# Patient Record
Sex: Female | Born: 1950 | Race: White | Hispanic: No | Marital: Married | Smoking: Never smoker
Health system: Southern US, Community
[De-identification: ages and names within clinical notes are randomized; demographics above are authoritative.]

## PROBLEM LIST (undated history)

## (undated) DIAGNOSIS — K5790 Diverticulosis of intestine, part unspecified, without perforation or abscess without bleeding: Secondary | ICD-10-CM

## (undated) DIAGNOSIS — M1990 Unspecified osteoarthritis, unspecified site: Secondary | ICD-10-CM

## (undated) DIAGNOSIS — G4730 Sleep apnea, unspecified: Secondary | ICD-10-CM

## (undated) DIAGNOSIS — D649 Anemia, unspecified: Secondary | ICD-10-CM

## (undated) DIAGNOSIS — K589 Irritable bowel syndrome without diarrhea: Secondary | ICD-10-CM

## (undated) DIAGNOSIS — J449 Chronic obstructive pulmonary disease, unspecified: Secondary | ICD-10-CM

## (undated) DIAGNOSIS — K649 Unspecified hemorrhoids: Secondary | ICD-10-CM

## (undated) DIAGNOSIS — Z9889 Other specified postprocedural states: Secondary | ICD-10-CM

## (undated) DIAGNOSIS — M549 Dorsalgia, unspecified: Secondary | ICD-10-CM

## (undated) DIAGNOSIS — C8290 Follicular lymphoma, unspecified, unspecified site: Secondary | ICD-10-CM

## (undated) DIAGNOSIS — M797 Fibromyalgia: Secondary | ICD-10-CM

## (undated) DIAGNOSIS — F32A Depression, unspecified: Secondary | ICD-10-CM

## (undated) DIAGNOSIS — K222 Esophageal obstruction: Secondary | ICD-10-CM

## (undated) DIAGNOSIS — K219 Gastro-esophageal reflux disease without esophagitis: Secondary | ICD-10-CM

## (undated) DIAGNOSIS — T7840XA Allergy, unspecified, initial encounter: Secondary | ICD-10-CM

## (undated) DIAGNOSIS — Z5189 Encounter for other specified aftercare: Secondary | ICD-10-CM

## (undated) DIAGNOSIS — R112 Nausea with vomiting, unspecified: Secondary | ICD-10-CM

## (undated) DIAGNOSIS — G8929 Other chronic pain: Secondary | ICD-10-CM

## (undated) DIAGNOSIS — E7800 Pure hypercholesterolemia, unspecified: Secondary | ICD-10-CM

## (undated) DIAGNOSIS — J40 Bronchitis, not specified as acute or chronic: Secondary | ICD-10-CM

## (undated) DIAGNOSIS — F329 Major depressive disorder, single episode, unspecified: Secondary | ICD-10-CM

## (undated) DIAGNOSIS — Z87442 Personal history of urinary calculi: Secondary | ICD-10-CM

## (undated) DIAGNOSIS — B029 Zoster without complications: Secondary | ICD-10-CM

## (undated) DIAGNOSIS — N200 Calculus of kidney: Secondary | ICD-10-CM

## (undated) DIAGNOSIS — K449 Diaphragmatic hernia without obstruction or gangrene: Secondary | ICD-10-CM

## (undated) HISTORY — DX: Diaphragmatic hernia without obstruction or gangrene: K44.9

## (undated) HISTORY — DX: Encounter for other specified aftercare: Z51.89

## (undated) HISTORY — PX: OVARIAN CYST REMOVAL: SHX89

## (undated) HISTORY — DX: Irritable bowel syndrome, unspecified: K58.9

## (undated) HISTORY — DX: Depression, unspecified: F32.A

## (undated) HISTORY — DX: Follicular lymphoma, unspecified, unspecified site: C82.90

## (undated) HISTORY — DX: Major depressive disorder, single episode, unspecified: F32.9

## (undated) HISTORY — DX: Other chronic pain: G89.29

## (undated) HISTORY — DX: Pure hypercholesterolemia, unspecified: E78.00

## (undated) HISTORY — DX: Zoster without complications: B02.9

## (undated) HISTORY — PX: TONSILLECTOMY: SUR1361

## (undated) HISTORY — PX: LEFT OOPHORECTOMY: SHX1961

## (undated) HISTORY — PX: RECTOCELE REPAIR: SHX761

## (undated) HISTORY — DX: Calculus of kidney: N20.0

## (undated) HISTORY — DX: Bronchitis, not specified as acute or chronic: J40

## (undated) HISTORY — DX: Anemia, unspecified: D64.9

## (undated) HISTORY — PX: VAGINAL HYSTERECTOMY: SUR661

## (undated) HISTORY — DX: Other chronic pain: M54.9

## (undated) HISTORY — PX: EYE SURGERY: SHX253

## (undated) HISTORY — DX: Gastro-esophageal reflux disease without esophagitis: K21.9

## (undated) HISTORY — DX: Allergy, unspecified, initial encounter: T78.40XA

## (undated) HISTORY — PX: APPENDECTOMY: SHX54

## (undated) HISTORY — PX: FRACTURE SURGERY: SHX138

## (undated) HISTORY — DX: Fibromyalgia: M79.7

## (undated) HISTORY — PX: BREAST BIOPSY: SHX20

## (undated) HISTORY — PX: JOINT REPLACEMENT: SHX530

## (undated) HISTORY — PX: OTHER SURGICAL HISTORY: SHX169

## (undated) HISTORY — DX: Chronic obstructive pulmonary disease, unspecified: J44.9

## (undated) HISTORY — DX: Esophageal obstruction: K22.2

## (undated) HISTORY — PX: LEG SURGERY: SHX1003

---

## 1968-02-06 HISTORY — PX: TONSILECTOMY/ADENOIDECTOMY WITH MYRINGOTOMY: SHX6125

## 1974-02-05 HISTORY — PX: TUBAL LIGATION: SHX77

## 1991-02-06 HISTORY — PX: BLADDER SURGERY: SHX569

## 1993-02-05 HISTORY — PX: CHOLECYSTECTOMY: SHX55

## 2003-12-23 ENCOUNTER — Ambulatory Visit: Payer: Self-pay | Admitting: Pain Medicine

## 2004-01-25 ENCOUNTER — Ambulatory Visit: Payer: Self-pay | Admitting: Pain Medicine

## 2004-04-13 ENCOUNTER — Ambulatory Visit: Payer: Self-pay | Admitting: Pain Medicine

## 2004-05-25 ENCOUNTER — Ambulatory Visit: Payer: Self-pay | Admitting: General Surgery

## 2004-07-04 ENCOUNTER — Ambulatory Visit: Payer: Self-pay | Admitting: Pain Medicine

## 2004-10-24 ENCOUNTER — Ambulatory Visit: Payer: Self-pay | Admitting: Pain Medicine

## 2004-11-16 ENCOUNTER — Ambulatory Visit: Payer: Self-pay | Admitting: Unknown Physician Specialty

## 2004-12-18 ENCOUNTER — Ambulatory Visit: Payer: Self-pay | Admitting: Unknown Physician Specialty

## 2004-12-26 ENCOUNTER — Ambulatory Visit: Payer: Self-pay | Admitting: Pain Medicine

## 2005-03-01 ENCOUNTER — Ambulatory Visit: Payer: Self-pay | Admitting: Pain Medicine

## 2005-04-24 ENCOUNTER — Ambulatory Visit: Payer: Self-pay | Admitting: Pain Medicine

## 2005-06-05 ENCOUNTER — Ambulatory Visit: Payer: Self-pay | Admitting: General Surgery

## 2005-06-19 ENCOUNTER — Ambulatory Visit: Payer: Self-pay | Admitting: Pain Medicine

## 2005-07-04 ENCOUNTER — Ambulatory Visit: Payer: Self-pay | Admitting: Pain Medicine

## 2005-08-16 ENCOUNTER — Ambulatory Visit: Payer: Self-pay | Admitting: Pain Medicine

## 2005-11-15 ENCOUNTER — Ambulatory Visit: Payer: Self-pay | Admitting: Pain Medicine

## 2005-11-26 ENCOUNTER — Ambulatory Visit: Payer: Self-pay | Admitting: Pain Medicine

## 2005-12-11 ENCOUNTER — Ambulatory Visit: Payer: Self-pay | Admitting: Pain Medicine

## 2006-02-07 ENCOUNTER — Ambulatory Visit: Payer: Self-pay | Admitting: Pain Medicine

## 2006-04-11 ENCOUNTER — Ambulatory Visit: Payer: Self-pay | Admitting: Pain Medicine

## 2006-06-06 ENCOUNTER — Ambulatory Visit: Payer: Self-pay | Admitting: Pain Medicine

## 2006-06-11 ENCOUNTER — Ambulatory Visit: Payer: Self-pay | Admitting: General Surgery

## 2006-08-13 ENCOUNTER — Ambulatory Visit: Payer: Self-pay | Admitting: Pain Medicine

## 2006-08-16 ENCOUNTER — Ambulatory Visit: Payer: Self-pay | Admitting: Unknown Physician Specialty

## 2006-10-08 ENCOUNTER — Ambulatory Visit: Payer: Self-pay | Admitting: Pain Medicine

## 2006-12-10 ENCOUNTER — Ambulatory Visit: Payer: Self-pay | Admitting: Pain Medicine

## 2007-01-16 ENCOUNTER — Ambulatory Visit: Payer: Self-pay | Admitting: Pain Medicine

## 2007-02-03 ENCOUNTER — Ambulatory Visit: Payer: Self-pay | Admitting: Unknown Physician Specialty

## 2007-02-11 ENCOUNTER — Ambulatory Visit: Payer: Self-pay | Admitting: Pain Medicine

## 2007-03-13 ENCOUNTER — Ambulatory Visit: Payer: Self-pay | Admitting: Pain Medicine

## 2007-05-13 ENCOUNTER — Ambulatory Visit: Payer: Self-pay | Admitting: Pain Medicine

## 2007-06-18 ENCOUNTER — Ambulatory Visit: Payer: Self-pay | Admitting: General Surgery

## 2007-06-19 ENCOUNTER — Ambulatory Visit: Payer: Self-pay | Admitting: General Surgery

## 2007-07-08 ENCOUNTER — Ambulatory Visit: Payer: Self-pay | Admitting: Pain Medicine

## 2007-10-28 ENCOUNTER — Ambulatory Visit: Payer: Self-pay | Admitting: Pain Medicine

## 2007-12-30 ENCOUNTER — Ambulatory Visit: Payer: Self-pay | Admitting: Pain Medicine

## 2008-02-26 ENCOUNTER — Ambulatory Visit: Payer: Self-pay | Admitting: Pain Medicine

## 2008-03-01 ENCOUNTER — Ambulatory Visit: Payer: Self-pay | Admitting: Pain Medicine

## 2008-03-08 ENCOUNTER — Ambulatory Visit: Payer: Self-pay | Admitting: Internal Medicine

## 2008-03-22 ENCOUNTER — Ambulatory Visit: Payer: Self-pay | Admitting: Pain Medicine

## 2008-03-25 ENCOUNTER — Ambulatory Visit: Payer: Self-pay | Admitting: Pain Medicine

## 2008-03-29 ENCOUNTER — Emergency Department: Payer: Self-pay | Admitting: Emergency Medicine

## 2008-03-30 ENCOUNTER — Inpatient Hospital Stay: Payer: Self-pay | Admitting: Urology

## 2008-04-05 ENCOUNTER — Ambulatory Visit: Payer: Self-pay | Admitting: Internal Medicine

## 2008-04-08 ENCOUNTER — Ambulatory Visit: Payer: Self-pay | Admitting: Urology

## 2008-04-15 ENCOUNTER — Ambulatory Visit: Payer: Self-pay | Admitting: Urology

## 2008-04-21 ENCOUNTER — Ambulatory Visit: Payer: Self-pay | Admitting: Urology

## 2008-04-27 ENCOUNTER — Ambulatory Visit: Payer: Self-pay | Admitting: Urology

## 2008-05-18 ENCOUNTER — Ambulatory Visit: Payer: Self-pay | Admitting: Pain Medicine

## 2008-05-24 ENCOUNTER — Ambulatory Visit: Payer: Self-pay | Admitting: Pain Medicine

## 2008-06-17 ENCOUNTER — Ambulatory Visit: Payer: Self-pay | Admitting: Pain Medicine

## 2008-06-21 ENCOUNTER — Ambulatory Visit: Payer: Self-pay | Admitting: General Surgery

## 2008-07-20 ENCOUNTER — Ambulatory Visit: Payer: Self-pay | Admitting: Pain Medicine

## 2008-07-28 ENCOUNTER — Ambulatory Visit: Payer: Self-pay | Admitting: Pain Medicine

## 2008-08-12 ENCOUNTER — Ambulatory Visit: Payer: Self-pay | Admitting: Pain Medicine

## 2008-09-09 ENCOUNTER — Ambulatory Visit: Payer: Self-pay | Admitting: Pain Medicine

## 2008-09-29 ENCOUNTER — Ambulatory Visit: Payer: Self-pay | Admitting: Unknown Physician Specialty

## 2008-10-07 ENCOUNTER — Ambulatory Visit: Payer: Self-pay | Admitting: Pain Medicine

## 2008-10-21 ENCOUNTER — Ambulatory Visit: Payer: Self-pay | Admitting: Urology

## 2008-11-11 ENCOUNTER — Ambulatory Visit: Payer: Self-pay | Admitting: Pain Medicine

## 2008-12-09 ENCOUNTER — Ambulatory Visit: Payer: Self-pay | Admitting: Pain Medicine

## 2009-01-06 ENCOUNTER — Ambulatory Visit: Payer: Self-pay | Admitting: Pain Medicine

## 2009-02-08 ENCOUNTER — Ambulatory Visit: Payer: Self-pay | Admitting: Pain Medicine

## 2009-03-10 ENCOUNTER — Ambulatory Visit: Payer: Self-pay | Admitting: Pain Medicine

## 2009-04-12 ENCOUNTER — Ambulatory Visit: Payer: Self-pay | Admitting: Pain Medicine

## 2009-05-10 ENCOUNTER — Ambulatory Visit: Payer: Self-pay | Admitting: Pain Medicine

## 2009-05-31 ENCOUNTER — Ambulatory Visit: Payer: Self-pay

## 2009-06-02 ENCOUNTER — Ambulatory Visit: Payer: Self-pay | Admitting: Unknown Physician Specialty

## 2009-06-09 ENCOUNTER — Ambulatory Visit: Payer: Self-pay | Admitting: Pain Medicine

## 2009-06-24 ENCOUNTER — Ambulatory Visit: Payer: Self-pay | Admitting: General Surgery

## 2009-06-27 ENCOUNTER — Ambulatory Visit: Payer: Self-pay | Admitting: Unknown Physician Specialty

## 2009-06-30 ENCOUNTER — Ambulatory Visit: Payer: Self-pay | Admitting: Pain Medicine

## 2009-06-30 ENCOUNTER — Ambulatory Visit: Payer: Self-pay | Admitting: General Surgery

## 2009-08-04 ENCOUNTER — Ambulatory Visit: Payer: Self-pay | Admitting: Pain Medicine

## 2009-09-06 ENCOUNTER — Ambulatory Visit: Payer: Self-pay | Admitting: Pain Medicine

## 2009-10-06 ENCOUNTER — Ambulatory Visit: Payer: Self-pay | Admitting: Pain Medicine

## 2009-10-24 ENCOUNTER — Ambulatory Visit: Payer: Self-pay | Admitting: Urology

## 2009-11-03 ENCOUNTER — Ambulatory Visit: Payer: Self-pay | Admitting: Pain Medicine

## 2009-11-29 ENCOUNTER — Ambulatory Visit: Payer: Self-pay | Admitting: Pain Medicine

## 2009-12-05 ENCOUNTER — Ambulatory Visit: Payer: Self-pay | Admitting: Pain Medicine

## 2010-01-03 ENCOUNTER — Ambulatory Visit: Payer: Self-pay | Admitting: Pain Medicine

## 2010-01-31 ENCOUNTER — Ambulatory Visit: Payer: Self-pay | Admitting: Pain Medicine

## 2010-02-05 HISTORY — PX: ORIF ANKLE FRACTURE: SUR919

## 2010-02-13 ENCOUNTER — Ambulatory Visit: Payer: Self-pay | Admitting: Pain Medicine

## 2010-02-25 IMAGING — CR DG ABDOMEN 1V
1 series · 1 of 1 positions shown · non-contrast
Comparison: none

REASON FOR EXAM: nephrolithiasis
COMMENTS:

PROCEDURE:     DXR - DXR KIDNEY URETER BLADDER  - October 21, 2008 [DATE]
RESULT:     Comparison is made to the exam of 04/27/2008.
No radiopaque renal calculi are evident. Cholecystectomy clips are present.
The bowel gas pattern is unremarkable.

[view not recorded]
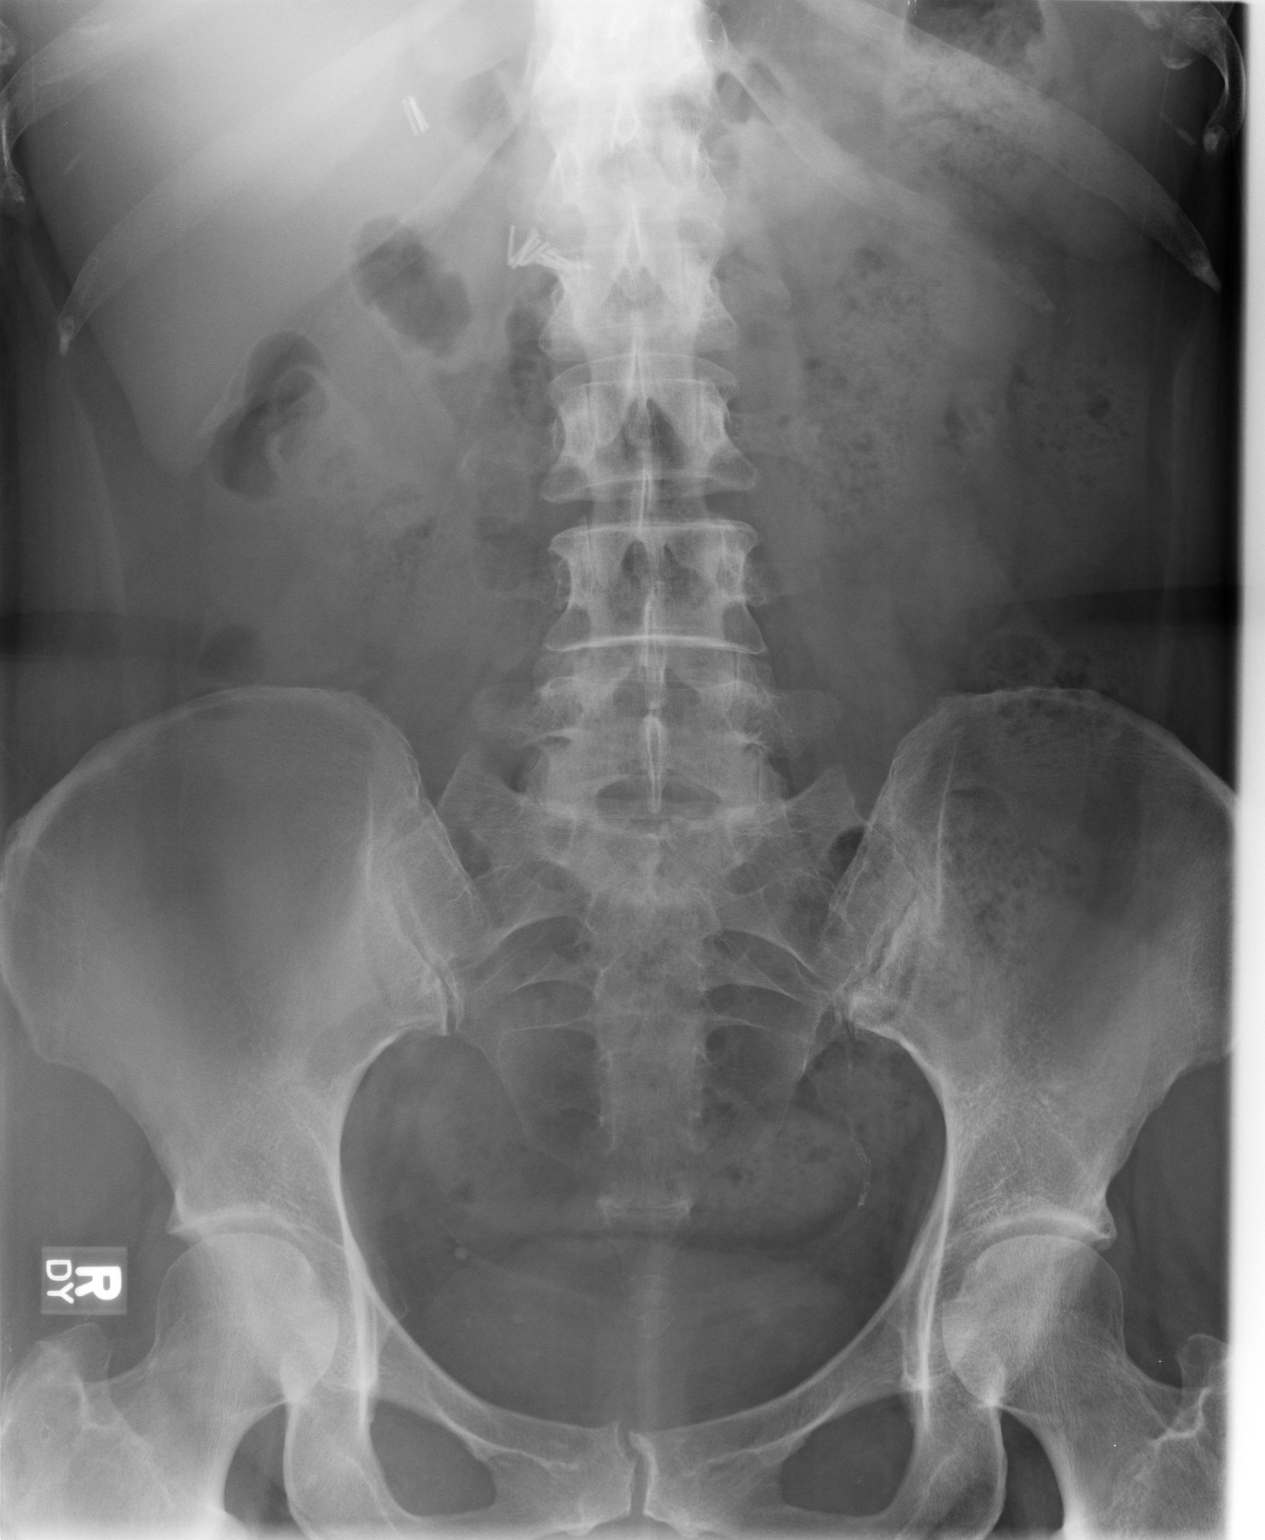

[1 of 1 positions shown; findings below may reference images not displayed]

IMPRESSION: No definite urinary tract calculi are evident. CT is
available for further assessment if desired.

## 2010-02-28 ENCOUNTER — Ambulatory Visit: Payer: Self-pay | Admitting: Pain Medicine

## 2010-03-30 ENCOUNTER — Ambulatory Visit: Payer: Self-pay | Admitting: Pain Medicine

## 2010-05-02 ENCOUNTER — Ambulatory Visit: Payer: Self-pay | Admitting: Pain Medicine

## 2010-05-10 ENCOUNTER — Ambulatory Visit: Payer: Self-pay | Admitting: Pain Medicine

## 2010-06-01 ENCOUNTER — Ambulatory Visit: Payer: Self-pay | Admitting: Pain Medicine

## 2010-06-29 ENCOUNTER — Ambulatory Visit: Payer: Self-pay | Admitting: Pain Medicine

## 2010-08-02 ENCOUNTER — Ambulatory Visit: Payer: Self-pay | Admitting: Pain Medicine

## 2010-09-05 ENCOUNTER — Ambulatory Visit: Payer: Self-pay | Admitting: Pain Medicine

## 2010-09-06 ENCOUNTER — Ambulatory Visit: Payer: Self-pay | Admitting: Internal Medicine

## 2010-09-07 ENCOUNTER — Ambulatory Visit: Payer: Self-pay | Admitting: Internal Medicine

## 2010-09-23 ENCOUNTER — Emergency Department: Payer: Self-pay | Admitting: Emergency Medicine

## 2010-09-27 ENCOUNTER — Ambulatory Visit: Payer: Self-pay | Admitting: Unknown Physician Specialty

## 2010-10-05 ENCOUNTER — Ambulatory Visit: Payer: Self-pay | Admitting: Pain Medicine

## 2010-10-07 ENCOUNTER — Ambulatory Visit: Payer: Self-pay | Admitting: Internal Medicine

## 2010-11-02 ENCOUNTER — Ambulatory Visit: Payer: Self-pay | Admitting: Pain Medicine

## 2010-11-23 ENCOUNTER — Ambulatory Visit: Payer: Self-pay | Admitting: Pain Medicine

## 2010-12-20 ENCOUNTER — Ambulatory Visit: Payer: Self-pay | Admitting: Internal Medicine

## 2010-12-20 ENCOUNTER — Ambulatory Visit: Payer: Self-pay | Admitting: Podiatry

## 2011-01-02 ENCOUNTER — Ambulatory Visit: Payer: Self-pay | Admitting: Pain Medicine

## 2011-01-06 ENCOUNTER — Ambulatory Visit: Payer: Self-pay | Admitting: Internal Medicine

## 2011-01-23 ENCOUNTER — Ambulatory Visit: Payer: Self-pay | Admitting: Pain Medicine

## 2011-02-14 ENCOUNTER — Ambulatory Visit: Payer: Self-pay | Admitting: Internal Medicine

## 2011-02-14 DIAGNOSIS — Z923 Personal history of irradiation: Secondary | ICD-10-CM | POA: Diagnosis not present

## 2011-02-14 DIAGNOSIS — Z7982 Long term (current) use of aspirin: Secondary | ICD-10-CM | POA: Diagnosis not present

## 2011-02-14 DIAGNOSIS — C8299 Follicular lymphoma, unspecified, extranodal and solid organ sites: Secondary | ICD-10-CM | POA: Diagnosis not present

## 2011-02-14 DIAGNOSIS — K219 Gastro-esophageal reflux disease without esophagitis: Secondary | ICD-10-CM | POA: Diagnosis not present

## 2011-02-14 DIAGNOSIS — IMO0001 Reserved for inherently not codable concepts without codable children: Secondary | ICD-10-CM | POA: Diagnosis not present

## 2011-02-14 DIAGNOSIS — K909 Intestinal malabsorption, unspecified: Secondary | ICD-10-CM | POA: Diagnosis not present

## 2011-02-14 DIAGNOSIS — F329 Major depressive disorder, single episode, unspecified: Secondary | ICD-10-CM | POA: Diagnosis not present

## 2011-02-14 DIAGNOSIS — K589 Irritable bowel syndrome without diarrhea: Secondary | ICD-10-CM | POA: Diagnosis not present

## 2011-02-14 DIAGNOSIS — Z79899 Other long term (current) drug therapy: Secondary | ICD-10-CM | POA: Diagnosis not present

## 2011-02-14 DIAGNOSIS — E538 Deficiency of other specified B group vitamins: Secondary | ICD-10-CM | POA: Diagnosis not present

## 2011-02-14 DIAGNOSIS — Z9221 Personal history of antineoplastic chemotherapy: Secondary | ICD-10-CM | POA: Diagnosis not present

## 2011-02-14 DIAGNOSIS — J309 Allergic rhinitis, unspecified: Secondary | ICD-10-CM | POA: Diagnosis not present

## 2011-02-14 DIAGNOSIS — D508 Other iron deficiency anemias: Secondary | ICD-10-CM | POA: Diagnosis not present

## 2011-02-14 DIAGNOSIS — D510 Vitamin B12 deficiency anemia due to intrinsic factor deficiency: Secondary | ICD-10-CM | POA: Diagnosis not present

## 2011-02-14 LAB — CBC CANCER CENTER
Basophil #: 0.1 x10 3/mm (ref 0.0–0.1)
Basophil %: 0.6 %
Eosinophil #: 0.1 x10 3/mm (ref 0.0–0.7)
Eosinophil %: 1 %
HGB: 13 g/dL (ref 12.0–16.0)
Lymphocyte %: 30.9 %
MCH: 28.3 pg (ref 26.0–34.0)
MCHC: 34.4 g/dL (ref 32.0–36.0)
Neutrophil #: 5.5 x10 3/mm (ref 1.4–6.5)
Neutrophil %: 61.6 %
RDW: 19.7 % — ABNORMAL HIGH (ref 11.5–14.5)

## 2011-02-14 LAB — IRON AND TIBC
Iron Saturation: 21 %
Iron: 75 ug/dL (ref 50–170)
Unbound Iron-Bind.Cap.: 275 ug/dL

## 2011-02-14 LAB — FERRITIN: Ferritin (ARMC): 178 ng/mL (ref 8–388)

## 2011-03-01 ENCOUNTER — Ambulatory Visit: Payer: Self-pay | Admitting: Pain Medicine

## 2011-03-01 DIAGNOSIS — M76899 Other specified enthesopathies of unspecified lower limb, excluding foot: Secondary | ICD-10-CM | POA: Diagnosis not present

## 2011-03-01 DIAGNOSIS — IMO0002 Reserved for concepts with insufficient information to code with codable children: Secondary | ICD-10-CM | POA: Diagnosis not present

## 2011-03-01 DIAGNOSIS — Z79899 Other long term (current) drug therapy: Secondary | ICD-10-CM | POA: Diagnosis not present

## 2011-03-01 DIAGNOSIS — Z87898 Personal history of other specified conditions: Secondary | ICD-10-CM | POA: Diagnosis not present

## 2011-03-01 DIAGNOSIS — Z87442 Personal history of urinary calculi: Secondary | ICD-10-CM | POA: Diagnosis not present

## 2011-03-01 DIAGNOSIS — M533 Sacrococcygeal disorders, not elsewhere classified: Secondary | ICD-10-CM | POA: Diagnosis not present

## 2011-03-01 DIAGNOSIS — M5137 Other intervertebral disc degeneration, lumbosacral region: Secondary | ICD-10-CM | POA: Diagnosis not present

## 2011-03-01 DIAGNOSIS — Z9889 Other specified postprocedural states: Secondary | ICD-10-CM | POA: Diagnosis not present

## 2011-03-09 ENCOUNTER — Ambulatory Visit: Payer: Self-pay | Admitting: Internal Medicine

## 2011-03-19 ENCOUNTER — Ambulatory Visit: Payer: Self-pay | Admitting: Internal Medicine

## 2011-03-19 DIAGNOSIS — R030 Elevated blood-pressure reading, without diagnosis of hypertension: Secondary | ICD-10-CM | POA: Diagnosis not present

## 2011-03-19 DIAGNOSIS — R609 Edema, unspecified: Secondary | ICD-10-CM | POA: Diagnosis not present

## 2011-03-19 DIAGNOSIS — J019 Acute sinusitis, unspecified: Secondary | ICD-10-CM | POA: Diagnosis not present

## 2011-03-19 DIAGNOSIS — M7989 Other specified soft tissue disorders: Secondary | ICD-10-CM | POA: Diagnosis not present

## 2011-04-03 ENCOUNTER — Ambulatory Visit: Payer: Self-pay | Admitting: Pain Medicine

## 2011-04-03 DIAGNOSIS — M2550 Pain in unspecified joint: Secondary | ICD-10-CM | POA: Diagnosis not present

## 2011-04-03 DIAGNOSIS — Z79899 Other long term (current) drug therapy: Secondary | ICD-10-CM | POA: Diagnosis not present

## 2011-04-03 DIAGNOSIS — Z87442 Personal history of urinary calculi: Secondary | ICD-10-CM | POA: Diagnosis not present

## 2011-04-03 DIAGNOSIS — M542 Cervicalgia: Secondary | ICD-10-CM | POA: Diagnosis not present

## 2011-04-03 DIAGNOSIS — K589 Irritable bowel syndrome without diarrhea: Secondary | ICD-10-CM | POA: Diagnosis not present

## 2011-04-03 DIAGNOSIS — Z87898 Personal history of other specified conditions: Secondary | ICD-10-CM | POA: Diagnosis not present

## 2011-04-03 DIAGNOSIS — Z9889 Other specified postprocedural states: Secondary | ICD-10-CM | POA: Diagnosis not present

## 2011-04-03 DIAGNOSIS — IMO0002 Reserved for concepts with insufficient information to code with codable children: Secondary | ICD-10-CM | POA: Diagnosis not present

## 2011-04-03 DIAGNOSIS — IMO0001 Reserved for inherently not codable concepts without codable children: Secondary | ICD-10-CM | POA: Diagnosis not present

## 2011-04-03 DIAGNOSIS — M79609 Pain in unspecified limb: Secondary | ICD-10-CM | POA: Diagnosis not present

## 2011-05-01 ENCOUNTER — Ambulatory Visit: Payer: Self-pay | Admitting: Pain Medicine

## 2011-05-01 DIAGNOSIS — Z9889 Other specified postprocedural states: Secondary | ICD-10-CM | POA: Diagnosis not present

## 2011-05-01 DIAGNOSIS — M76899 Other specified enthesopathies of unspecified lower limb, excluding foot: Secondary | ICD-10-CM | POA: Diagnosis not present

## 2011-05-01 DIAGNOSIS — M542 Cervicalgia: Secondary | ICD-10-CM | POA: Diagnosis not present

## 2011-05-01 DIAGNOSIS — C8589 Other specified types of non-Hodgkin lymphoma, extranodal and solid organ sites: Secondary | ICD-10-CM | POA: Diagnosis not present

## 2011-05-01 DIAGNOSIS — K589 Irritable bowel syndrome without diarrhea: Secondary | ICD-10-CM | POA: Diagnosis not present

## 2011-05-01 DIAGNOSIS — IMO0002 Reserved for concepts with insufficient information to code with codable children: Secondary | ICD-10-CM | POA: Diagnosis not present

## 2011-05-01 DIAGNOSIS — Z87442 Personal history of urinary calculi: Secondary | ICD-10-CM | POA: Diagnosis not present

## 2011-05-01 DIAGNOSIS — M5137 Other intervertebral disc degeneration, lumbosacral region: Secondary | ICD-10-CM | POA: Diagnosis not present

## 2011-05-01 DIAGNOSIS — M79609 Pain in unspecified limb: Secondary | ICD-10-CM | POA: Diagnosis not present

## 2011-05-01 DIAGNOSIS — M545 Low back pain, unspecified: Secondary | ICD-10-CM | POA: Diagnosis not present

## 2011-05-01 DIAGNOSIS — M546 Pain in thoracic spine: Secondary | ICD-10-CM | POA: Diagnosis not present

## 2011-05-01 DIAGNOSIS — Z79899 Other long term (current) drug therapy: Secondary | ICD-10-CM | POA: Diagnosis not present

## 2011-05-08 DIAGNOSIS — F4542 Pain disorder with related psychological factors: Secondary | ICD-10-CM | POA: Diagnosis not present

## 2011-05-08 DIAGNOSIS — C8589 Other specified types of non-Hodgkin lymphoma, extranodal and solid organ sites: Secondary | ICD-10-CM | POA: Diagnosis not present

## 2011-05-08 DIAGNOSIS — F329 Major depressive disorder, single episode, unspecified: Secondary | ICD-10-CM | POA: Diagnosis not present

## 2011-05-21 DIAGNOSIS — C8589 Other specified types of non-Hodgkin lymphoma, extranodal and solid organ sites: Secondary | ICD-10-CM | POA: Diagnosis not present

## 2011-05-21 DIAGNOSIS — Z79899 Other long term (current) drug therapy: Secondary | ICD-10-CM | POA: Diagnosis not present

## 2011-05-21 DIAGNOSIS — C8299 Follicular lymphoma, unspecified, extranodal and solid organ sites: Secondary | ICD-10-CM | POA: Diagnosis not present

## 2011-05-29 DIAGNOSIS — E7800 Pure hypercholesterolemia, unspecified: Secondary | ICD-10-CM | POA: Diagnosis not present

## 2011-05-29 DIAGNOSIS — E039 Hypothyroidism, unspecified: Secondary | ICD-10-CM | POA: Diagnosis not present

## 2011-05-29 DIAGNOSIS — I10 Essential (primary) hypertension: Secondary | ICD-10-CM | POA: Diagnosis not present

## 2011-05-31 ENCOUNTER — Ambulatory Visit: Payer: Self-pay | Admitting: Pain Medicine

## 2011-05-31 DIAGNOSIS — M545 Low back pain, unspecified: Secondary | ICD-10-CM | POA: Diagnosis not present

## 2011-05-31 DIAGNOSIS — M47817 Spondylosis without myelopathy or radiculopathy, lumbosacral region: Secondary | ICD-10-CM | POA: Diagnosis not present

## 2011-05-31 DIAGNOSIS — M79609 Pain in unspecified limb: Secondary | ICD-10-CM | POA: Diagnosis not present

## 2011-05-31 DIAGNOSIS — M5137 Other intervertebral disc degeneration, lumbosacral region: Secondary | ICD-10-CM | POA: Diagnosis not present

## 2011-05-31 DIAGNOSIS — Z79899 Other long term (current) drug therapy: Secondary | ICD-10-CM | POA: Diagnosis not present

## 2011-05-31 DIAGNOSIS — IMO0002 Reserved for concepts with insufficient information to code with codable children: Secondary | ICD-10-CM | POA: Diagnosis not present

## 2011-05-31 DIAGNOSIS — R51 Headache: Secondary | ICD-10-CM | POA: Diagnosis not present

## 2011-05-31 DIAGNOSIS — M5126 Other intervertebral disc displacement, lumbar region: Secondary | ICD-10-CM | POA: Diagnosis not present

## 2011-05-31 DIAGNOSIS — G4730 Sleep apnea, unspecified: Secondary | ICD-10-CM | POA: Diagnosis not present

## 2011-05-31 DIAGNOSIS — M542 Cervicalgia: Secondary | ICD-10-CM | POA: Diagnosis not present

## 2011-06-05 DIAGNOSIS — C8589 Other specified types of non-Hodgkin lymphoma, extranodal and solid organ sites: Secondary | ICD-10-CM | POA: Diagnosis not present

## 2011-06-05 DIAGNOSIS — D649 Anemia, unspecified: Secondary | ICD-10-CM | POA: Diagnosis not present

## 2011-06-05 DIAGNOSIS — E7800 Pure hypercholesterolemia, unspecified: Secondary | ICD-10-CM | POA: Diagnosis not present

## 2011-06-15 DIAGNOSIS — E878 Other disorders of electrolyte and fluid balance, not elsewhere classified: Secondary | ICD-10-CM | POA: Diagnosis not present

## 2011-06-25 DIAGNOSIS — Z87898 Personal history of other specified conditions: Secondary | ICD-10-CM | POA: Diagnosis not present

## 2011-06-25 DIAGNOSIS — D249 Benign neoplasm of unspecified breast: Secondary | ICD-10-CM | POA: Diagnosis not present

## 2011-06-25 DIAGNOSIS — Z1239 Encounter for other screening for malignant neoplasm of breast: Secondary | ICD-10-CM | POA: Diagnosis not present

## 2011-06-25 DIAGNOSIS — R5381 Other malaise: Secondary | ICD-10-CM | POA: Diagnosis not present

## 2011-06-25 DIAGNOSIS — R5383 Other fatigue: Secondary | ICD-10-CM | POA: Diagnosis not present

## 2011-06-25 DIAGNOSIS — N6019 Diffuse cystic mastopathy of unspecified breast: Secondary | ICD-10-CM | POA: Diagnosis not present

## 2011-06-25 DIAGNOSIS — Z09 Encounter for follow-up examination after completed treatment for conditions other than malignant neoplasm: Secondary | ICD-10-CM | POA: Diagnosis not present

## 2011-06-25 DIAGNOSIS — C50919 Malignant neoplasm of unspecified site of unspecified female breast: Secondary | ICD-10-CM | POA: Diagnosis not present

## 2011-06-28 ENCOUNTER — Ambulatory Visit: Payer: Self-pay | Admitting: Pain Medicine

## 2011-06-28 DIAGNOSIS — M76899 Other specified enthesopathies of unspecified lower limb, excluding foot: Secondary | ICD-10-CM | POA: Diagnosis not present

## 2011-06-28 DIAGNOSIS — M79609 Pain in unspecified limb: Secondary | ICD-10-CM | POA: Diagnosis not present

## 2011-06-28 DIAGNOSIS — IMO0002 Reserved for concepts with insufficient information to code with codable children: Secondary | ICD-10-CM | POA: Diagnosis not present

## 2011-06-28 DIAGNOSIS — C8589 Other specified types of non-Hodgkin lymphoma, extranodal and solid organ sites: Secondary | ICD-10-CM | POA: Diagnosis not present

## 2011-06-28 DIAGNOSIS — M542 Cervicalgia: Secondary | ICD-10-CM | POA: Diagnosis not present

## 2011-06-28 DIAGNOSIS — E878 Other disorders of electrolyte and fluid balance, not elsewhere classified: Secondary | ICD-10-CM | POA: Diagnosis not present

## 2011-06-28 DIAGNOSIS — M545 Low back pain, unspecified: Secondary | ICD-10-CM | POA: Diagnosis not present

## 2011-06-28 DIAGNOSIS — Z79899 Other long term (current) drug therapy: Secondary | ICD-10-CM | POA: Diagnosis not present

## 2011-07-04 DIAGNOSIS — N816 Rectocele: Secondary | ICD-10-CM | POA: Diagnosis not present

## 2011-07-04 DIAGNOSIS — R351 Nocturia: Secondary | ICD-10-CM | POA: Diagnosis not present

## 2011-07-04 DIAGNOSIS — R350 Frequency of micturition: Secondary | ICD-10-CM | POA: Diagnosis not present

## 2011-07-12 DIAGNOSIS — E875 Hyperkalemia: Secondary | ICD-10-CM | POA: Diagnosis not present

## 2011-07-17 DIAGNOSIS — N816 Rectocele: Secondary | ICD-10-CM | POA: Diagnosis not present

## 2011-07-17 DIAGNOSIS — N815 Vaginal enterocele: Secondary | ICD-10-CM | POA: Diagnosis not present

## 2011-07-19 ENCOUNTER — Ambulatory Visit: Payer: Self-pay | Admitting: Obstetrics and Gynecology

## 2011-07-19 DIAGNOSIS — Z01810 Encounter for preprocedural cardiovascular examination: Secondary | ICD-10-CM | POA: Diagnosis not present

## 2011-07-19 DIAGNOSIS — K469 Unspecified abdominal hernia without obstruction or gangrene: Secondary | ICD-10-CM | POA: Diagnosis not present

## 2011-07-19 DIAGNOSIS — Z01812 Encounter for preprocedural laboratory examination: Secondary | ICD-10-CM | POA: Diagnosis not present

## 2011-07-19 DIAGNOSIS — R9431 Abnormal electrocardiogram [ECG] [EKG]: Secondary | ICD-10-CM | POA: Diagnosis not present

## 2011-07-19 LAB — CBC
HCT: 38.5 % (ref 35.0–47.0)
HGB: 12.9 g/dL (ref 12.0–16.0)
MCH: 28.8 pg (ref 26.0–34.0)
MCHC: 33.4 g/dL (ref 32.0–36.0)
Platelet: 201 10*3/uL (ref 150–440)
RBC: 4.48 10*6/uL (ref 3.80–5.20)

## 2011-07-19 LAB — POTASSIUM: Potassium: 3.6 mmol/L (ref 3.5–5.1)

## 2011-07-23 ENCOUNTER — Ambulatory Visit: Payer: Self-pay | Admitting: Obstetrics and Gynecology

## 2011-07-23 DIAGNOSIS — Z87898 Personal history of other specified conditions: Secondary | ICD-10-CM | POA: Diagnosis not present

## 2011-07-23 DIAGNOSIS — IMO0001 Reserved for inherently not codable concepts without codable children: Secondary | ICD-10-CM | POA: Diagnosis not present

## 2011-07-23 DIAGNOSIS — N815 Vaginal enterocele: Secondary | ICD-10-CM | POA: Diagnosis not present

## 2011-07-23 DIAGNOSIS — N816 Rectocele: Secondary | ICD-10-CM | POA: Diagnosis not present

## 2011-07-23 DIAGNOSIS — K449 Diaphragmatic hernia without obstruction or gangrene: Secondary | ICD-10-CM | POA: Diagnosis not present

## 2011-07-23 DIAGNOSIS — K589 Irritable bowel syndrome without diarrhea: Secondary | ICD-10-CM | POA: Diagnosis not present

## 2011-07-23 DIAGNOSIS — K5730 Diverticulosis of large intestine without perforation or abscess without bleeding: Secondary | ICD-10-CM | POA: Diagnosis not present

## 2011-07-23 DIAGNOSIS — Z87442 Personal history of urinary calculi: Secondary | ICD-10-CM | POA: Diagnosis not present

## 2011-07-23 DIAGNOSIS — K5900 Constipation, unspecified: Secondary | ICD-10-CM | POA: Diagnosis not present

## 2011-07-23 DIAGNOSIS — N949 Unspecified condition associated with female genital organs and menstrual cycle: Secondary | ICD-10-CM | POA: Diagnosis not present

## 2011-08-07 ENCOUNTER — Ambulatory Visit: Payer: Self-pay | Admitting: Pain Medicine

## 2011-08-07 DIAGNOSIS — IMO0002 Reserved for concepts with insufficient information to code with codable children: Secondary | ICD-10-CM | POA: Diagnosis not present

## 2011-08-07 DIAGNOSIS — M5137 Other intervertebral disc degeneration, lumbosacral region: Secondary | ICD-10-CM | POA: Diagnosis not present

## 2011-08-07 DIAGNOSIS — M47817 Spondylosis without myelopathy or radiculopathy, lumbosacral region: Secondary | ICD-10-CM | POA: Diagnosis not present

## 2011-08-07 DIAGNOSIS — M76899 Other specified enthesopathies of unspecified lower limb, excluding foot: Secondary | ICD-10-CM | POA: Diagnosis not present

## 2011-08-07 DIAGNOSIS — M545 Low back pain, unspecified: Secondary | ICD-10-CM | POA: Diagnosis not present

## 2011-08-07 DIAGNOSIS — M542 Cervicalgia: Secondary | ICD-10-CM | POA: Diagnosis not present

## 2011-08-07 DIAGNOSIS — M5030 Other cervical disc degeneration, unspecified cervical region: Secondary | ICD-10-CM | POA: Diagnosis not present

## 2011-08-07 DIAGNOSIS — M79609 Pain in unspecified limb: Secondary | ICD-10-CM | POA: Diagnosis not present

## 2011-08-07 DIAGNOSIS — M546 Pain in thoracic spine: Secondary | ICD-10-CM | POA: Diagnosis not present

## 2011-08-07 DIAGNOSIS — Z79899 Other long term (current) drug therapy: Secondary | ICD-10-CM | POA: Diagnosis not present

## 2011-08-29 DIAGNOSIS — N898 Other specified noninflammatory disorders of vagina: Secondary | ICD-10-CM | POA: Diagnosis not present

## 2011-08-29 DIAGNOSIS — L918 Other hypertrophic disorders of the skin: Secondary | ICD-10-CM | POA: Diagnosis not present

## 2011-08-29 DIAGNOSIS — N912 Amenorrhea, unspecified: Secondary | ICD-10-CM | POA: Diagnosis not present

## 2011-08-29 DIAGNOSIS — Z9889 Other specified postprocedural states: Secondary | ICD-10-CM | POA: Diagnosis not present

## 2011-08-30 ENCOUNTER — Ambulatory Visit: Payer: Self-pay | Admitting: Pain Medicine

## 2011-08-30 DIAGNOSIS — IMO0002 Reserved for concepts with insufficient information to code with codable children: Secondary | ICD-10-CM | POA: Diagnosis not present

## 2011-08-30 DIAGNOSIS — M47817 Spondylosis without myelopathy or radiculopathy, lumbosacral region: Secondary | ICD-10-CM | POA: Diagnosis not present

## 2011-08-30 DIAGNOSIS — Z79899 Other long term (current) drug therapy: Secondary | ICD-10-CM | POA: Diagnosis not present

## 2011-08-30 DIAGNOSIS — M542 Cervicalgia: Secondary | ICD-10-CM | POA: Diagnosis not present

## 2011-08-30 DIAGNOSIS — M546 Pain in thoracic spine: Secondary | ICD-10-CM | POA: Diagnosis not present

## 2011-08-30 DIAGNOSIS — M533 Sacrococcygeal disorders, not elsewhere classified: Secondary | ICD-10-CM | POA: Diagnosis not present

## 2011-08-30 DIAGNOSIS — M76899 Other specified enthesopathies of unspecified lower limb, excluding foot: Secondary | ICD-10-CM | POA: Diagnosis not present

## 2011-08-30 DIAGNOSIS — M25559 Pain in unspecified hip: Secondary | ICD-10-CM | POA: Diagnosis not present

## 2011-08-30 DIAGNOSIS — M545 Low back pain, unspecified: Secondary | ICD-10-CM | POA: Diagnosis not present

## 2011-08-30 DIAGNOSIS — M79609 Pain in unspecified limb: Secondary | ICD-10-CM | POA: Diagnosis not present

## 2011-09-11 DIAGNOSIS — C8589 Other specified types of non-Hodgkin lymphoma, extranodal and solid organ sites: Secondary | ICD-10-CM | POA: Diagnosis not present

## 2011-09-11 DIAGNOSIS — F4542 Pain disorder with related psychological factors: Secondary | ICD-10-CM | POA: Diagnosis not present

## 2011-09-11 DIAGNOSIS — F329 Major depressive disorder, single episode, unspecified: Secondary | ICD-10-CM | POA: Diagnosis not present

## 2011-09-19 ENCOUNTER — Ambulatory Visit: Payer: Self-pay | Admitting: Pain Medicine

## 2011-09-19 DIAGNOSIS — M461 Sacroiliitis, not elsewhere classified: Secondary | ICD-10-CM | POA: Diagnosis not present

## 2011-09-19 DIAGNOSIS — M62838 Other muscle spasm: Secondary | ICD-10-CM | POA: Diagnosis not present

## 2011-09-19 DIAGNOSIS — M47817 Spondylosis without myelopathy or radiculopathy, lumbosacral region: Secondary | ICD-10-CM | POA: Diagnosis not present

## 2011-09-19 DIAGNOSIS — M76899 Other specified enthesopathies of unspecified lower limb, excluding foot: Secondary | ICD-10-CM | POA: Diagnosis not present

## 2011-09-19 DIAGNOSIS — IMO0002 Reserved for concepts with insufficient information to code with codable children: Secondary | ICD-10-CM | POA: Diagnosis not present

## 2011-10-30 ENCOUNTER — Ambulatory Visit: Payer: Self-pay | Admitting: Urology

## 2011-10-30 DIAGNOSIS — Z87442 Personal history of urinary calculi: Secondary | ICD-10-CM | POA: Insufficient documentation

## 2011-10-30 DIAGNOSIS — N200 Calculus of kidney: Secondary | ICD-10-CM | POA: Diagnosis not present

## 2011-10-30 DIAGNOSIS — N23 Unspecified renal colic: Secondary | ICD-10-CM | POA: Diagnosis not present

## 2011-10-31 ENCOUNTER — Ambulatory Visit: Payer: Self-pay | Admitting: Pain Medicine

## 2011-10-31 DIAGNOSIS — M5137 Other intervertebral disc degeneration, lumbosacral region: Secondary | ICD-10-CM | POA: Diagnosis not present

## 2011-10-31 DIAGNOSIS — M5430 Sciatica, unspecified side: Secondary | ICD-10-CM | POA: Diagnosis not present

## 2011-10-31 DIAGNOSIS — IMO0002 Reserved for concepts with insufficient information to code with codable children: Secondary | ICD-10-CM | POA: Diagnosis not present

## 2011-10-31 DIAGNOSIS — M545 Low back pain, unspecified: Secondary | ICD-10-CM | POA: Diagnosis not present

## 2011-10-31 DIAGNOSIS — M79609 Pain in unspecified limb: Secondary | ICD-10-CM | POA: Diagnosis not present

## 2011-10-31 DIAGNOSIS — M542 Cervicalgia: Secondary | ICD-10-CM | POA: Diagnosis not present

## 2011-10-31 DIAGNOSIS — M47817 Spondylosis without myelopathy or radiculopathy, lumbosacral region: Secondary | ICD-10-CM | POA: Diagnosis not present

## 2011-10-31 DIAGNOSIS — M461 Sacroiliitis, not elsewhere classified: Secondary | ICD-10-CM | POA: Diagnosis not present

## 2011-10-31 DIAGNOSIS — M546 Pain in thoracic spine: Secondary | ICD-10-CM | POA: Diagnosis not present

## 2011-10-31 DIAGNOSIS — Z79899 Other long term (current) drug therapy: Secondary | ICD-10-CM | POA: Diagnosis not present

## 2011-10-31 DIAGNOSIS — M76899 Other specified enthesopathies of unspecified lower limb, excluding foot: Secondary | ICD-10-CM | POA: Diagnosis not present

## 2011-11-07 ENCOUNTER — Other Ambulatory Visit: Payer: Self-pay | Admitting: Internal Medicine

## 2011-11-07 MED ORDER — FLUTICASONE PROPIONATE 50 MCG/ACT NA SUSP: 2.0000 | Freq: Every day | NASAL | Status: DC

## 2011-11-07 MED ORDER — ESTRADIOL 1 MG PO TABS: 1.0000 mg | ORAL_TABLET | Freq: Every day | ORAL | Status: DC

## 2011-11-07 NOTE — Telephone Encounter (Signed)
Pt is needing a refill on estrogen and flonase and shes uses [facility].

## 2011-11-07 NOTE — Telephone Encounter (Signed)
Ok to refill both x 4

## 2011-11-07 NOTE — Telephone Encounter (Signed)
Rx's refilled patient notified via telephone.

## 2011-11-12 ENCOUNTER — Ambulatory Visit: Payer: Self-pay | Admitting: Internal Medicine

## 2011-11-12 ENCOUNTER — Telehealth: Payer: Self-pay | Admitting: Internal Medicine

## 2011-11-12 NOTE — Telephone Encounter (Signed)
Patient is needing to be seen . Patient has had a sore throat for 3 weeks, non-productive cough and she is hurting underneath her ribs.

## 2011-11-12 NOTE — Telephone Encounter (Signed)
This is the patient I talked to you about.  She can be scheduled tomorrow at 9:30 - work in for this problem.

## 2011-11-12 NOTE — Telephone Encounter (Signed)
Patient is coming in tomorrow 11/13/11 at 9:30.

## 2011-11-13 ENCOUNTER — Encounter: Payer: Self-pay | Admitting: Internal Medicine

## 2011-11-13 ENCOUNTER — Ambulatory Visit (INDEPENDENT_AMBULATORY_CARE_PROVIDER_SITE_OTHER): Payer: Medicare Other | Admitting: Internal Medicine

## 2011-11-13 VITALS — BP 112/72 | HR 76 | Temp 98.4°F | Ht 67.0 in | Wt 181.0 lb

## 2011-11-13 DIAGNOSIS — C8590 Non-Hodgkin lymphoma, unspecified, unspecified site: Secondary | ICD-10-CM | POA: Insufficient documentation

## 2011-11-13 DIAGNOSIS — Z8579 Personal history of other malignant neoplasms of lymphoid, hematopoietic and related tissues: Secondary | ICD-10-CM | POA: Insufficient documentation

## 2011-11-13 DIAGNOSIS — J029 Acute pharyngitis, unspecified: Secondary | ICD-10-CM

## 2011-11-13 DIAGNOSIS — D649 Anemia, unspecified: Secondary | ICD-10-CM | POA: Diagnosis not present

## 2011-11-13 DIAGNOSIS — C8589 Other specified types of non-Hodgkin lymphoma, extranodal and solid organ sites: Secondary | ICD-10-CM

## 2011-11-13 LAB — POCT RAPID STREP A (OFFICE): Rapid Strep A Screen: NEGATIVE

## 2011-11-13 MED ORDER — DOXYCYCLINE HYCLATE 100 MG PO TABS: 100.0000 mg | ORAL_TABLET | Freq: Two times a day (BID) | ORAL | Status: DC

## 2011-11-13 MED ORDER — FLUCONAZOLE 150 MG PO TABS: 150.0000 mg | ORAL_TABLET | Freq: Once | ORAL | Status: DC

## 2011-11-13 NOTE — Progress Notes (Signed)
  Subjective:    Patient ID: Maria Keller, female    DOB: [birth year], 61 y.o.   MRN: [id number]  HPI 61 year old female with past history of lymphoma and anemia.  She comes in today as a work in with concerns regarding a sore throat, congestion and cough.    Past Medical History  Diagnosis Date  . Lymphoma     Followed by Dr James Ivanoff  . Anemia   . Hypercholesterolemia     Review of Systems Patient reports symptoms started three weeks ago.  Her sister was diagnosed with strep throat - prior to her getting sick.  Reports increased nasal congestion with increased postnasal drainage.  Throat is sore.  Able to swallow.  Eating and drinking well.  No increased sinus pressure, but does report some increased nasal mucus production.  Intermittent low grade fever.   No increased chest congestion.  No chest tightness or wheezing.  She does report some occasional cough - mostly nonproductive.  No shortness of breath.  No nausea or vomiting.  No bowel change.  She reports noticing some pain in her right lower anterior rib.  Intermittent flares over the last week.  When it flares, movement and deep breathing aggravate.  Some increased pain to palpation.  No known injury or trauma.  No rash.        Objective:   Physical Exam Filed Vitals:   11/13/11 0937  BP: 112/72  Pulse: 76  Temp: 98.4 F (36.9 C)   61year old female in no acute distress.   HEENT:  Nares - clear except slightly erythematous turbinates.  Minimal tenderness to palpation over the maxillary and frontal sinus.  TMs without erythema.  OP- without lesions or erythema, no exudates.  NECK:  Supple.  No significant lymphadenopathy.  Minimal tenderness to palpation.  No stiffness.    HEART:  Appears to be regular. LUNGS:  Without crackles or wheezing audible.  Respirations even and unlabored.  Good breath sounds bilaterally.   CHEST WALL.  Reproducible tenderness to palpation over the lower anterior (right) ribs.  No rash.  No palpable lump.        Assessment & Plan:  1.  PROBABLE URI/POSSIBLE SINUS INFECTION.  Treat with Doxycycline 100mg  bid x 10 days.  Rapid strep negative.  Continue Flonase nasal spray daily and flush with saline nasal spray 2-3x/day.  Plain Robitussin as directed.  Rest.  Fluids.  Explained to her if symptoms change, worsen or do not resolve - she needs to be reevaluated.    2.  RIB PAIN.  Feel is related to above.  Treat the infection.  If incomplete resolution, will need further evaluation.

## 2011-11-13 NOTE — Patient Instructions (Signed)
It was good seeing you today.  I am sorry you have not been feeling well.  I am going to give you an antibiotic (Doxycycline) to take twice a day.  Take with food.  Continue to use your saline nasal spray and Flonase nasal spray as you are doing.  Plain Robitussin as directed.  Call or be reevaluated if symptoms worsen or do not resolve.

## 2011-11-13 NOTE — Assessment & Plan Note (Signed)
Was being followed by hematology.  Obtain latest records.

## 2011-11-13 NOTE — Assessment & Plan Note (Signed)
Sees Dr James Ivanoff.  Has a follow up scheduled this month.

## 2011-11-19 ENCOUNTER — Telehealth: Payer: Self-pay | Admitting: Internal Medicine

## 2011-11-19 NOTE — Telephone Encounter (Signed)
Caller: Akshara/Patient; Patient Name: Maria Keller; PCP: Dale ; Best Callback Phone Number: [phone].  Onset 11/13/11 Patient reports she was seen in the office 11/13/11 and diagnosed URI, Rx doxycline x 10 days.  Afebrile. All emergent symptoms ruled out per Upper Respiratory Infection protocol with exception " Hoarseness."  Home care advice given.

## 2011-11-20 NOTE — Telephone Encounter (Signed)
Noted.  Let us know if any problems or if anything more needed.

## 2011-11-26 DIAGNOSIS — B029 Zoster without complications: Secondary | ICD-10-CM

## 2011-11-26 DIAGNOSIS — Z5181 Encounter for therapeutic drug level monitoring: Secondary | ICD-10-CM | POA: Diagnosis not present

## 2011-11-26 DIAGNOSIS — Z79899 Other long term (current) drug therapy: Secondary | ICD-10-CM | POA: Diagnosis not present

## 2011-11-26 DIAGNOSIS — C8589 Other specified types of non-Hodgkin lymphoma, extranodal and solid organ sites: Secondary | ICD-10-CM | POA: Diagnosis not present

## 2011-11-26 DIAGNOSIS — Z792 Long term (current) use of antibiotics: Secondary | ICD-10-CM | POA: Diagnosis not present

## 2011-11-26 DIAGNOSIS — IMO0001 Reserved for inherently not codable concepts without codable children: Secondary | ICD-10-CM | POA: Diagnosis not present

## 2011-11-26 DIAGNOSIS — Z7982 Long term (current) use of aspirin: Secondary | ICD-10-CM | POA: Diagnosis not present

## 2011-11-26 HISTORY — DX: Zoster without complications: B02.9

## 2011-11-27 ENCOUNTER — Ambulatory Visit: Payer: Self-pay | Admitting: Pain Medicine

## 2011-11-27 DIAGNOSIS — M461 Sacroiliitis, not elsewhere classified: Secondary | ICD-10-CM | POA: Diagnosis not present

## 2011-11-27 DIAGNOSIS — M545 Low back pain, unspecified: Secondary | ICD-10-CM | POA: Diagnosis not present

## 2011-11-27 DIAGNOSIS — Z79899 Other long term (current) drug therapy: Secondary | ICD-10-CM | POA: Diagnosis not present

## 2011-11-27 DIAGNOSIS — M47817 Spondylosis without myelopathy or radiculopathy, lumbosacral region: Secondary | ICD-10-CM | POA: Diagnosis not present

## 2011-11-27 DIAGNOSIS — Z87898 Personal history of other specified conditions: Secondary | ICD-10-CM | POA: Diagnosis not present

## 2011-11-27 DIAGNOSIS — B0229 Other postherpetic nervous system involvement: Secondary | ICD-10-CM | POA: Diagnosis not present

## 2011-11-27 DIAGNOSIS — M76899 Other specified enthesopathies of unspecified lower limb, excluding foot: Secondary | ICD-10-CM | POA: Diagnosis not present

## 2011-11-27 DIAGNOSIS — M5430 Sciatica, unspecified side: Secondary | ICD-10-CM | POA: Diagnosis not present

## 2011-11-27 DIAGNOSIS — M546 Pain in thoracic spine: Secondary | ICD-10-CM | POA: Diagnosis not present

## 2011-11-27 DIAGNOSIS — IMO0002 Reserved for concepts with insufficient information to code with codable children: Secondary | ICD-10-CM | POA: Diagnosis not present

## 2011-11-27 DIAGNOSIS — M79609 Pain in unspecified limb: Secondary | ICD-10-CM | POA: Diagnosis not present

## 2011-11-28 ENCOUNTER — Ambulatory Visit: Payer: Self-pay | Admitting: Pain Medicine

## 2011-11-28 DIAGNOSIS — IMO0002 Reserved for concepts with insufficient information to code with codable children: Secondary | ICD-10-CM | POA: Diagnosis not present

## 2011-11-28 DIAGNOSIS — M62838 Other muscle spasm: Secondary | ICD-10-CM | POA: Diagnosis not present

## 2011-11-28 DIAGNOSIS — B0229 Other postherpetic nervous system involvement: Secondary | ICD-10-CM | POA: Diagnosis not present

## 2011-11-28 DIAGNOSIS — G5680 Other specified mononeuropathies of unspecified upper limb: Secondary | ICD-10-CM | POA: Diagnosis not present

## 2011-11-28 DIAGNOSIS — M546 Pain in thoracic spine: Secondary | ICD-10-CM | POA: Diagnosis not present

## 2011-11-28 DIAGNOSIS — Z87898 Personal history of other specified conditions: Secondary | ICD-10-CM | POA: Diagnosis not present

## 2011-12-18 DIAGNOSIS — F4542 Pain disorder with related psychological factors: Secondary | ICD-10-CM | POA: Diagnosis not present

## 2011-12-18 DIAGNOSIS — F329 Major depressive disorder, single episode, unspecified: Secondary | ICD-10-CM | POA: Diagnosis not present

## 2011-12-18 DIAGNOSIS — C8589 Other specified types of non-Hodgkin lymphoma, extranodal and solid organ sites: Secondary | ICD-10-CM | POA: Diagnosis not present

## 2011-12-24 ENCOUNTER — Encounter: Payer: Self-pay | Admitting: Internal Medicine

## 2011-12-24 ENCOUNTER — Ambulatory Visit (INDEPENDENT_AMBULATORY_CARE_PROVIDER_SITE_OTHER): Payer: Medicare Other | Admitting: Internal Medicine

## 2011-12-24 ENCOUNTER — Telehealth: Payer: Self-pay | Admitting: Internal Medicine

## 2011-12-24 VITALS — BP 132/70 | HR 69 | Temp 97.0°F | Ht 66.0 in | Wt 179.5 lb

## 2011-12-24 DIAGNOSIS — J069 Acute upper respiratory infection, unspecified: Secondary | ICD-10-CM

## 2011-12-24 DIAGNOSIS — J329 Chronic sinusitis, unspecified: Secondary | ICD-10-CM | POA: Diagnosis not present

## 2011-12-24 MED ORDER — DOXYCYCLINE HYCLATE 100 MG PO TABS: 100.0000 mg | ORAL_TABLET | Freq: Two times a day (BID) | ORAL | Status: DC

## 2011-12-24 MED ORDER — ALBUTEROL SULFATE HFA 108 (90 BASE) MCG/ACT IN AERS: 2.0000 | INHALATION_SPRAY | Freq: Four times a day (QID) | RESPIRATORY_TRACT | Status: DC | PRN

## 2011-12-24 NOTE — Telephone Encounter (Signed)
See if she can come this pm around 4:00.  May have to wait- will work in for this

## 2011-12-24 NOTE — Telephone Encounter (Signed)
Pt aware of appointment 

## 2011-12-24 NOTE — Telephone Encounter (Signed)
Pt called wanting to be seen asap  She has sore throat cough headache low grade fever

## 2011-12-24 NOTE — Patient Instructions (Addendum)
I am sorry you have not been feeling well.  Continue the saline nasal flushes and Flonase as you have been doing.  Continue Mucinex and robitussin as you have been doing.  I am going to give you Doxycycline (an antibiotic) to take twice a day.  Let me know if you have any problems.

## 2011-12-25 NOTE — Progress Notes (Signed)
  Subjective:    Patient ID: Maria Keller, female    DOB: 08-17-1950, 60 y.o.   MRN: [id number]  HPI 61 year old female with past history of lymphoma who comes in today as a work in with concerns regarding increased congestion and cough.  I treated her a few weeks ago and those symptoms resolved.  She states these symptoms worsened yesterday.  Husband was sick last week.  Describes increased sinus pressure.  Increased cough at night.  Taking Mucinex in the am and Robitussin in the evening.  Has had a low grade temp.  Took tylenol.  Some throat congestion and sore throat.  No vomiting.  No deep chest congestion.  No sob or wheezing.    Past Medical History  Diagnosis Date  . Lymphoma     Followed by Dr James Ivanoff  . Anemia   . Hypercholesterolemia   . Shingles outbreak 11/26/2011    Review of Systems Patient denies any headache, lightheadedness or dizziness.  Does report the increased sinus pressure and congestion as outlined.  See above.   No chest pain, tightness or palpitations.  No increased shortness of breath.  Does report the cough as outlined.  No deep chest congestion. No nausea or vomiting.  No abdominal pain or cramping.  No bowel change, such as diarrhea, constipation, BRBPR or melana.  No urine change.        Objective:   Physical Exam Filed Vitals:   12/24/11 1605  BP: 132/70  Pulse: 69  Temp: 97 F (82.45 C)   61 year old female in no acute distress.   HEENT:  Nares - clear except slightly erythematous turbinates.  No significant tenderness to palpation over the sinuses.  OP- without lesions or erythema.  TMs visualized - without erythema.   NECK:  Supple.  No significant tenderness to palpation.    HEART:  Appears to be regular. LUNGS:  Without crackles or wheezing audible.  Respirations even and unlabored.  EXTREMITIES:  No increased edema to be present.                     Assessment & Plan:  URI/POSSIBLE SINUSITIS.  Treat with Doxycycline 100mg  bid x 10 days.   Continue saline nasal flushes and Flonase nasal spray as directed.  Continue Mucinex in the am and Robitussin in the evening.  Albuterol inhaler as directed.  Rest.  Fluids.  Explained to her if symptoms persisted - would need reevaluation.   PREVIOUS SIDE PAIN.  Diagnosed with shingles.  Stable.  Not a significant issue for her now.

## 2012-01-01 ENCOUNTER — Ambulatory Visit: Payer: Self-pay | Admitting: Pain Medicine

## 2012-01-01 DIAGNOSIS — B0229 Other postherpetic nervous system involvement: Secondary | ICD-10-CM | POA: Diagnosis not present

## 2012-01-01 DIAGNOSIS — M546 Pain in thoracic spine: Secondary | ICD-10-CM | POA: Diagnosis not present

## 2012-01-01 DIAGNOSIS — M76899 Other specified enthesopathies of unspecified lower limb, excluding foot: Secondary | ICD-10-CM | POA: Diagnosis not present

## 2012-01-01 DIAGNOSIS — IMO0002 Reserved for concepts with insufficient information to code with codable children: Secondary | ICD-10-CM | POA: Diagnosis not present

## 2012-01-01 DIAGNOSIS — M5137 Other intervertebral disc degeneration, lumbosacral region: Secondary | ICD-10-CM | POA: Diagnosis not present

## 2012-01-01 DIAGNOSIS — M62838 Other muscle spasm: Secondary | ICD-10-CM | POA: Diagnosis not present

## 2012-01-01 DIAGNOSIS — Z79899 Other long term (current) drug therapy: Secondary | ICD-10-CM | POA: Diagnosis not present

## 2012-01-01 DIAGNOSIS — M47817 Spondylosis without myelopathy or radiculopathy, lumbosacral region: Secondary | ICD-10-CM | POA: Diagnosis not present

## 2012-01-06 ENCOUNTER — Encounter: Payer: Self-pay | Admitting: Internal Medicine

## 2012-01-15 DIAGNOSIS — C8589 Other specified types of non-Hodgkin lymphoma, extranodal and solid organ sites: Secondary | ICD-10-CM | POA: Diagnosis not present

## 2012-01-15 DIAGNOSIS — F4542 Pain disorder with related psychological factors: Secondary | ICD-10-CM | POA: Diagnosis not present

## 2012-01-15 DIAGNOSIS — F329 Major depressive disorder, single episode, unspecified: Secondary | ICD-10-CM | POA: Diagnosis not present

## 2012-02-05 ENCOUNTER — Ambulatory Visit: Payer: Self-pay | Admitting: Pain Medicine

## 2012-02-05 DIAGNOSIS — M76899 Other specified enthesopathies of unspecified lower limb, excluding foot: Secondary | ICD-10-CM | POA: Diagnosis not present

## 2012-02-05 DIAGNOSIS — B029 Zoster without complications: Secondary | ICD-10-CM | POA: Diagnosis not present

## 2012-02-05 DIAGNOSIS — M546 Pain in thoracic spine: Secondary | ICD-10-CM | POA: Diagnosis not present

## 2012-02-05 DIAGNOSIS — IMO0002 Reserved for concepts with insufficient information to code with codable children: Secondary | ICD-10-CM | POA: Diagnosis not present

## 2012-02-05 DIAGNOSIS — M62838 Other muscle spasm: Secondary | ICD-10-CM | POA: Diagnosis not present

## 2012-02-05 DIAGNOSIS — M79609 Pain in unspecified limb: Secondary | ICD-10-CM | POA: Diagnosis not present

## 2012-02-05 DIAGNOSIS — M47817 Spondylosis without myelopathy or radiculopathy, lumbosacral region: Secondary | ICD-10-CM | POA: Diagnosis not present

## 2012-02-05 DIAGNOSIS — M542 Cervicalgia: Secondary | ICD-10-CM | POA: Diagnosis not present

## 2012-02-05 DIAGNOSIS — Z79899 Other long term (current) drug therapy: Secondary | ICD-10-CM | POA: Diagnosis not present

## 2012-02-05 DIAGNOSIS — M545 Low back pain, unspecified: Secondary | ICD-10-CM | POA: Diagnosis not present

## 2012-02-15 ENCOUNTER — Encounter: Payer: Self-pay | Admitting: Internal Medicine

## 2012-02-15 ENCOUNTER — Ambulatory Visit (INDEPENDENT_AMBULATORY_CARE_PROVIDER_SITE_OTHER): Payer: Medicare Other | Admitting: Internal Medicine

## 2012-02-15 VITALS — BP 112/60 | HR 78 | Temp 99.1°F | Ht 67.0 in | Wt 177.8 lb

## 2012-02-15 DIAGNOSIS — Z139 Encounter for screening, unspecified: Secondary | ICD-10-CM

## 2012-02-15 DIAGNOSIS — D649 Anemia, unspecified: Secondary | ICD-10-CM

## 2012-02-15 DIAGNOSIS — E7800 Pure hypercholesterolemia, unspecified: Secondary | ICD-10-CM | POA: Diagnosis not present

## 2012-02-15 DIAGNOSIS — IMO0001 Reserved for inherently not codable concepts without codable children: Secondary | ICD-10-CM | POA: Insufficient documentation

## 2012-02-15 DIAGNOSIS — M549 Dorsalgia, unspecified: Secondary | ICD-10-CM | POA: Insufficient documentation

## 2012-02-15 DIAGNOSIS — K219 Gastro-esophageal reflux disease without esophagitis: Secondary | ICD-10-CM | POA: Insufficient documentation

## 2012-02-15 DIAGNOSIS — R5383 Other fatigue: Secondary | ICD-10-CM

## 2012-02-15 DIAGNOSIS — N200 Calculus of kidney: Secondary | ICD-10-CM

## 2012-02-15 DIAGNOSIS — G8929 Other chronic pain: Secondary | ICD-10-CM

## 2012-02-15 DIAGNOSIS — R5381 Other malaise: Secondary | ICD-10-CM | POA: Diagnosis not present

## 2012-02-15 DIAGNOSIS — M797 Fibromyalgia: Secondary | ICD-10-CM

## 2012-02-15 DIAGNOSIS — C8590 Non-Hodgkin lymphoma, unspecified, unspecified site: Secondary | ICD-10-CM

## 2012-02-15 DIAGNOSIS — C8589 Other specified types of non-Hodgkin lymphoma, extranodal and solid organ sites: Secondary | ICD-10-CM

## 2012-02-15 LAB — CBC WITH DIFFERENTIAL/PLATELET
Basophils Absolute: 0 10*3/uL (ref 0.0–0.1)
Eosinophils Absolute: 0.1 10*3/uL (ref 0.0–0.7)
Lymphocytes Relative: 41 % (ref 12.0–46.0)
MCHC: 33.5 g/dL (ref 30.0–36.0)
Monocytes Relative: 6.9 % (ref 3.0–12.0)
Neutrophils Relative %: 50.3 % (ref 43.0–77.0)
Platelets: 217 10*3/uL (ref 150.0–400.0)
RDW: 14 % (ref 11.5–14.6)

## 2012-02-15 LAB — COMPREHENSIVE METABOLIC PANEL
ALT: 33 U/L (ref 0–35)
AST: 35 U/L (ref 0–37)
CO2: 34 mEq/L — ABNORMAL HIGH (ref 19–32)
Calcium: 9.3 mg/dL (ref 8.4–10.5)
Chloride: 98 mEq/L (ref 96–112)
Creatinine, Ser: 0.9 mg/dL (ref 0.4–1.2)
GFR: 64.67 mL/min (ref >60.00)
Sodium: 139 mEq/L (ref 135–145)
Total Protein: 7.1 g/dL (ref 6.0–8.3)

## 2012-02-15 LAB — LDL CHOLESTEROL, DIRECT: Direct LDL: 159.8 mg/dL

## 2012-02-15 LAB — LIPID PANEL
HDL: 53.5 mg/dL (ref >39.00)
Total CHOL/HDL Ratio: 5
Triglycerides: 194 mg/dL — ABNORMAL HIGH (ref 0.0–149.0)

## 2012-02-15 MED ORDER — ESTRADIOL 1 MG PO TABS: 1.0000 mg | ORAL_TABLET | Freq: Every day | ORAL | Status: DC

## 2012-02-15 MED ORDER — FLUTICASONE PROPIONATE 50 MCG/ACT NA SUSP: 2.0000 | Freq: Every day | NASAL | Status: DC

## 2012-02-15 MED ORDER — OMEPRAZOLE 20 MG PO CPDR: 20.0000 mg | DELAYED_RELEASE_CAPSULE | Freq: Two times a day (BID) | ORAL | Status: DC

## 2012-02-15 MED ORDER — FUROSEMIDE 20 MG PO TABS: 20.0000 mg | ORAL_TABLET | ORAL | Status: DC | PRN

## 2012-02-15 MED ORDER — DOXYCYCLINE HYCLATE 100 MG PO TABS: 100.0000 mg | ORAL_TABLET | Freq: Two times a day (BID) | ORAL | Status: DC

## 2012-02-17 ENCOUNTER — Telehealth: Payer: Self-pay | Admitting: Internal Medicine

## 2012-02-17 ENCOUNTER — Encounter: Payer: Self-pay | Admitting: Internal Medicine

## 2012-02-17 DIAGNOSIS — E7800 Pure hypercholesterolemia, unspecified: Secondary | ICD-10-CM

## 2012-02-17 NOTE — Progress Notes (Signed)
Subjective:    Patient ID: Maria Keller, female    DOB: Mar 09, 1950, 62 y.o.   MRN: [id number]  HPI 62 year old female with past history of fibromyalgia, GERD, depression and follicular lymphoma who comes in today to follow up on these issues as well as for a complete physical exam.  She states she is doing relatively well.  She has (over the last few days) started having some increased nasal congestion and sinus pressure.  Yellow mucus production.  Soreness and sensitivity - teeth.  Temp - 99.1.  No chest congestion of cough.  No nausea or vomiting.  No abdominal pain.  Bowels stable.   Past Medical History  Diagnosis Date  . 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 Anemia     iron deficiency and B12 deficiency  . Hypercholesterolemia   . Shingles outbreak 11/26/2011  . IBS (irritable bowel syndrome)   . Chronic back pain     followed by Dr Metta Clines  . Nephrolithiasis     followed by Dr Lonna Cobb, s/p stents  . Esophageal stricture     requiring dilatation x 2  . Hiatal hernia   . Depression   . Fibromyalgia   . GERD (gastroesophageal reflux disease)     Current Outpatient Prescriptions on File Prior to Visit  Medication Sig Dispense Refill  . albuterol (PROVENTIL HFA;VENTOLIN HFA) 108 (90 BASE) MCG/ACT inhaler Inhale 2 puffs into the lungs every 6 (six) hours as needed for wheezing.  1 Inhaler  0  . aspirin 81 MG tablet Take 81 mg by mouth daily.      . calcium carbonate (TUMS - DOSED IN MG ELEMENTAL CALCIUM) 500 MG chewable tablet Chew 1 tablet by mouth as needed.      . Coenzyme Q10 (COQ10) 200 MG CAPS Take 1 capsule by mouth daily.      Marland Kitchen doxycycline (VIBRA-TABS) 100 MG tablet Take 1 tablet (100 mg total) by mouth 2 (two) times daily.  20 tablet  0  . doxycycline (VIBRA-TABS) 100 MG tablet Take 1 tablet (100 mg total) by mouth 2 (two) times daily.  20 tablet  0  . estradiol (ESTRACE) 1 MG tablet Take 1 tablet (1 mg total) by mouth daily.  90 tablet  1    . fentaNYL (DURAGESIC - DOSED MCG/HR) 100 MCG/HR Place 1 patch onto the skin every other day.      . fentaNYL (DURAGESIC - DOSED MCG/HR) 25 MCG/HR Place 1 patch onto the skin every other day.      . fluconazole (DIFLUCAN) 150 MG tablet Take 1 tablet (150 mg total) by mouth once.  1 tablet  0  . fluticasone (FLONASE) 50 MCG/ACT nasal spray Place 2 sprays into the nose daily.  48 g  3  . furosemide (LASIX) 20 MG tablet Take 1 tablet (20 mg total) by mouth as needed.  90 tablet  1  . Omega-3 Fatty Acids (FISH OIL) 1360 MG CAPS Take 1 capsule by mouth daily.      Marland Kitchen omeprazole (PRILOSEC) 20 MG capsule Take 1 capsule (20 mg total) by mouth 2 (two) times daily.  180 capsule  3  . sennosides-docusate sodium (SENOKOT-S) 8.6-50 MG tablet Take 1 tablet by mouth as needed.        Review of Systems Patient denies any headache, lightheadedness or dizziness.  Does report the sinus pressure and congestion as outlined.  No chest pain, tightness or palpitations.  No increased shortness of breath, cough or congestion.  No nausea or vomiting.  No abdominal pain or cramping.  No bowel change, such as diarrhea, constipation, BRBPR or melana.  No urine change.  Still with increased fatigue.       Objective:   Physical Exam Filed Vitals:   02/15/12 1044  BP: 112/60  Pulse: 78  Temp: 99.1 F (68.4 C)   62 year old female in no acute distress.   HEENT:  Nares- clear except slightly erythematous turbinates.  Oropharynx - without lesions.  Minimal tenderness to palpation over the maxillary sinus.  NECK:  Supple.  Nontender.  No audible bruit.  HEART:  Appears to be regular. LUNGS:  No crackles or wheezing audible.  Respirations even and unlabored.  RADIAL PULSE:  Equal bilaterally.    BREASTS:  No nipple discharge or nipple retraction present.  Could not appreciate any distinct nodules or axillary adenopathy.  ABDOMEN:  Soft, nontender.  Bowel sounds present and normal.  No audible abdominal bruit.  GU:  Normal  external genitalia.  Vaginal vault without lesions.  S/P hysterectomy.  Could not appreciate any adnexal masses or tenderness.   RECTAL:  Heme negative.   EXTREMITIES:  No increased edema present.  DP pulses palpable and equal bilaterally.          Assessment & Plan:  POSSIBLE SINUSITIS.  Treat with saline nasal flushes and Flonase as directed.  Robitussin as directed.   Will give her a rx for doxycycline 100mg .  Only take if symptoms persist or worsen.    PSYCH.  Continues to follow up with Dr Imogene Burn.  Stable.   CARDIOVASCULAR.  ECHO 02/23/08 - normal LV function with mild right ventricle enlargement.  Saw Dr Darrold Junker.  No evidence of ischemia on ETT myoview.  Currently asymptomatic.  Follow.   HEALTH MAINTENANCE.  Physical today.  S/P hysterectomy.  Does not need yearly pap smears.  Mammograms done through [facility].  Schedule.  Saw GI and they felt no further w/up warranted.  IFOB given today.

## 2012-02-17 NOTE — Assessment & Plan Note (Signed)
Stable

## 2012-02-17 NOTE — Assessment & Plan Note (Signed)
Being followed at the pain clinic - Dr Crisp.    

## 2012-02-17 NOTE — Assessment & Plan Note (Signed)
On omeprazole.  Symptoms controlled.  Follow.   

## 2012-02-17 NOTE — Assessment & Plan Note (Signed)
Previous urosepsis.  Continue to follow up with Dr Stoioff.  Currently asymptomatic.     

## 2012-02-17 NOTE — Assessment & Plan Note (Signed)
Was being followed at the cancer center.  Recheck cbc and ferritin.

## 2012-02-17 NOTE — Assessment & Plan Note (Signed)
Being followed by Dr James Ivanoff.  Recommended follow up six months.

## 2012-02-17 NOTE — Assessment & Plan Note (Signed)
Low cholesterol diet and exercise.  Had problems with statin medication previously.  Check lipid panel.

## 2012-02-17 NOTE — Telephone Encounter (Signed)
Pt notified of labs and need for a follow up fasting lab - prior to her next appt.  Please schedule lab for 08/15/12 - 9:00.  Pt aware of appt - just place on lab schedule.  Thanks.

## 2012-02-18 NOTE — Telephone Encounter (Signed)
Appointment made

## 2012-02-21 ENCOUNTER — Other Ambulatory Visit (INDEPENDENT_AMBULATORY_CARE_PROVIDER_SITE_OTHER): Payer: Medicare Other | Admitting: *Deleted

## 2012-02-21 DIAGNOSIS — D649 Anemia, unspecified: Secondary | ICD-10-CM | POA: Diagnosis not present

## 2012-02-21 DIAGNOSIS — Z139 Encounter for screening, unspecified: Secondary | ICD-10-CM

## 2012-02-25 LAB — FECAL OCCULT BLOOD, IMMUNOCHEMICAL: Fecal Occult Bld: POSITIVE

## 2012-02-27 ENCOUNTER — Other Ambulatory Visit: Payer: Self-pay | Admitting: Internal Medicine

## 2012-02-27 DIAGNOSIS — R195 Other fecal abnormalities: Secondary | ICD-10-CM

## 2012-02-27 NOTE — Progress Notes (Signed)
Order placed for gi referral.   

## 2012-02-29 ENCOUNTER — Ambulatory Visit: Payer: Medicare Other

## 2012-02-29 DIAGNOSIS — Z23 Encounter for immunization: Secondary | ICD-10-CM | POA: Diagnosis not present

## 2012-02-29 DIAGNOSIS — R1031 Right lower quadrant pain: Secondary | ICD-10-CM | POA: Diagnosis not present

## 2012-02-29 DIAGNOSIS — R195 Other fecal abnormalities: Secondary | ICD-10-CM | POA: Diagnosis not present

## 2012-03-10 ENCOUNTER — Ambulatory Visit: Payer: Self-pay | Admitting: Unknown Physician Specialty

## 2012-03-10 DIAGNOSIS — E7800 Pure hypercholesterolemia, unspecified: Secondary | ICD-10-CM | POA: Diagnosis not present

## 2012-03-10 DIAGNOSIS — D131 Benign neoplasm of stomach: Secondary | ICD-10-CM | POA: Diagnosis not present

## 2012-03-10 DIAGNOSIS — Z885 Allergy status to narcotic agent status: Secondary | ICD-10-CM | POA: Diagnosis not present

## 2012-03-10 DIAGNOSIS — Z880 Allergy status to penicillin: Secondary | ICD-10-CM | POA: Diagnosis not present

## 2012-03-10 DIAGNOSIS — R195 Other fecal abnormalities: Secondary | ICD-10-CM | POA: Diagnosis not present

## 2012-03-10 DIAGNOSIS — K921 Melena: Secondary | ICD-10-CM | POA: Diagnosis not present

## 2012-03-10 DIAGNOSIS — K2970 Gastritis, unspecified, without bleeding: Secondary | ICD-10-CM | POA: Diagnosis not present

## 2012-03-10 DIAGNOSIS — K6389 Other specified diseases of intestine: Secondary | ICD-10-CM | POA: Diagnosis not present

## 2012-03-10 DIAGNOSIS — Z79899 Other long term (current) drug therapy: Secondary | ICD-10-CM | POA: Diagnosis not present

## 2012-03-10 DIAGNOSIS — Z888 Allergy status to other drugs, medicaments and biological substances status: Secondary | ICD-10-CM | POA: Diagnosis not present

## 2012-03-10 DIAGNOSIS — Z87898 Personal history of other specified conditions: Secondary | ICD-10-CM | POA: Diagnosis not present

## 2012-03-10 DIAGNOSIS — J309 Allergic rhinitis, unspecified: Secondary | ICD-10-CM | POA: Diagnosis not present

## 2012-03-10 DIAGNOSIS — IMO0001 Reserved for inherently not codable concepts without codable children: Secondary | ICD-10-CM | POA: Diagnosis not present

## 2012-03-10 DIAGNOSIS — D509 Iron deficiency anemia, unspecified: Secondary | ICD-10-CM | POA: Diagnosis not present

## 2012-03-10 DIAGNOSIS — K648 Other hemorrhoids: Secondary | ICD-10-CM | POA: Diagnosis not present

## 2012-03-10 DIAGNOSIS — M549 Dorsalgia, unspecified: Secondary | ICD-10-CM | POA: Diagnosis not present

## 2012-03-10 DIAGNOSIS — Z7982 Long term (current) use of aspirin: Secondary | ICD-10-CM | POA: Diagnosis not present

## 2012-03-10 DIAGNOSIS — R1031 Right lower quadrant pain: Secondary | ICD-10-CM | POA: Diagnosis not present

## 2012-03-10 DIAGNOSIS — K2980 Duodenitis without bleeding: Secondary | ICD-10-CM | POA: Diagnosis not present

## 2012-03-10 DIAGNOSIS — Z807 Family history of other malignant neoplasms of lymphoid, hematopoietic and related tissues: Secondary | ICD-10-CM | POA: Diagnosis not present

## 2012-03-10 DIAGNOSIS — F329 Major depressive disorder, single episode, unspecified: Secondary | ICD-10-CM | POA: Diagnosis not present

## 2012-03-10 DIAGNOSIS — Z8371 Family history of colonic polyps: Secondary | ICD-10-CM | POA: Diagnosis not present

## 2012-03-10 DIAGNOSIS — K319 Disease of stomach and duodenum, unspecified: Secondary | ICD-10-CM | POA: Diagnosis not present

## 2012-03-11 ENCOUNTER — Ambulatory Visit: Payer: Self-pay | Admitting: Pain Medicine

## 2012-03-11 DIAGNOSIS — M5137 Other intervertebral disc degeneration, lumbosacral region: Secondary | ICD-10-CM | POA: Diagnosis not present

## 2012-03-11 DIAGNOSIS — M47817 Spondylosis without myelopathy or radiculopathy, lumbosacral region: Secondary | ICD-10-CM | POA: Diagnosis not present

## 2012-03-11 DIAGNOSIS — M76899 Other specified enthesopathies of unspecified lower limb, excluding foot: Secondary | ICD-10-CM | POA: Diagnosis not present

## 2012-03-11 DIAGNOSIS — Z87898 Personal history of other specified conditions: Secondary | ICD-10-CM | POA: Diagnosis not present

## 2012-03-11 DIAGNOSIS — B0229 Other postherpetic nervous system involvement: Secondary | ICD-10-CM | POA: Diagnosis not present

## 2012-03-11 DIAGNOSIS — IMO0002 Reserved for concepts with insufficient information to code with codable children: Secondary | ICD-10-CM | POA: Diagnosis not present

## 2012-03-11 DIAGNOSIS — M62838 Other muscle spasm: Secondary | ICD-10-CM | POA: Diagnosis not present

## 2012-03-11 DIAGNOSIS — Z79899 Other long term (current) drug therapy: Secondary | ICD-10-CM | POA: Diagnosis not present

## 2012-03-11 DIAGNOSIS — M7150 Other bursitis, not elsewhere classified, unspecified site: Secondary | ICD-10-CM | POA: Diagnosis not present

## 2012-03-11 DIAGNOSIS — M546 Pain in thoracic spine: Secondary | ICD-10-CM | POA: Diagnosis not present

## 2012-03-12 LAB — PATHOLOGY REPORT

## 2012-03-31 ENCOUNTER — Encounter: Payer: Self-pay | Admitting: Internal Medicine

## 2012-04-10 ENCOUNTER — Ambulatory Visit: Payer: Self-pay | Admitting: Pain Medicine

## 2012-04-10 DIAGNOSIS — Z79899 Other long term (current) drug therapy: Secondary | ICD-10-CM | POA: Diagnosis not present

## 2012-04-10 DIAGNOSIS — Z87898 Personal history of other specified conditions: Secondary | ICD-10-CM | POA: Diagnosis not present

## 2012-04-10 DIAGNOSIS — M545 Low back pain, unspecified: Secondary | ICD-10-CM | POA: Diagnosis not present

## 2012-04-15 DIAGNOSIS — F4542 Pain disorder with related psychological factors: Secondary | ICD-10-CM | POA: Diagnosis not present

## 2012-04-15 DIAGNOSIS — C8589 Other specified types of non-Hodgkin lymphoma, extranodal and solid organ sites: Secondary | ICD-10-CM | POA: Diagnosis not present

## 2012-05-06 ENCOUNTER — Ambulatory Visit: Payer: Self-pay | Admitting: Pain Medicine

## 2012-05-06 DIAGNOSIS — B0229 Other postherpetic nervous system involvement: Secondary | ICD-10-CM | POA: Diagnosis not present

## 2012-05-06 DIAGNOSIS — M461 Sacroiliitis, not elsewhere classified: Secondary | ICD-10-CM | POA: Diagnosis not present

## 2012-05-06 DIAGNOSIS — M5137 Other intervertebral disc degeneration, lumbosacral region: Secondary | ICD-10-CM | POA: Diagnosis not present

## 2012-05-06 DIAGNOSIS — G894 Chronic pain syndrome: Secondary | ICD-10-CM | POA: Diagnosis not present

## 2012-05-06 DIAGNOSIS — Z87898 Personal history of other specified conditions: Secondary | ICD-10-CM | POA: Diagnosis not present

## 2012-05-06 DIAGNOSIS — IMO0002 Reserved for concepts with insufficient information to code with codable children: Secondary | ICD-10-CM | POA: Diagnosis not present

## 2012-05-06 DIAGNOSIS — M76899 Other specified enthesopathies of unspecified lower limb, excluding foot: Secondary | ICD-10-CM | POA: Diagnosis not present

## 2012-05-06 DIAGNOSIS — M47817 Spondylosis without myelopathy or radiculopathy, lumbosacral region: Secondary | ICD-10-CM | POA: Diagnosis not present

## 2012-05-06 DIAGNOSIS — F449 Dissociative and conversion disorder, unspecified: Secondary | ICD-10-CM | POA: Diagnosis not present

## 2012-05-06 DIAGNOSIS — M546 Pain in thoracic spine: Secondary | ICD-10-CM | POA: Diagnosis not present

## 2012-05-06 DIAGNOSIS — Z79899 Other long term (current) drug therapy: Secondary | ICD-10-CM | POA: Diagnosis not present

## 2012-05-26 DIAGNOSIS — C8589 Other specified types of non-Hodgkin lymphoma, extranodal and solid organ sites: Secondary | ICD-10-CM | POA: Diagnosis not present

## 2012-06-05 ENCOUNTER — Ambulatory Visit: Payer: Self-pay | Admitting: Pain Medicine

## 2012-06-05 DIAGNOSIS — IMO0002 Reserved for concepts with insufficient information to code with codable children: Secondary | ICD-10-CM | POA: Diagnosis not present

## 2012-06-05 DIAGNOSIS — B0229 Other postherpetic nervous system involvement: Secondary | ICD-10-CM | POA: Diagnosis not present

## 2012-06-05 DIAGNOSIS — M47817 Spondylosis without myelopathy or radiculopathy, lumbosacral region: Secondary | ICD-10-CM | POA: Diagnosis not present

## 2012-06-05 DIAGNOSIS — Z79899 Other long term (current) drug therapy: Secondary | ICD-10-CM | POA: Diagnosis not present

## 2012-06-05 DIAGNOSIS — M76899 Other specified enthesopathies of unspecified lower limb, excluding foot: Secondary | ICD-10-CM | POA: Diagnosis not present

## 2012-06-05 DIAGNOSIS — M546 Pain in thoracic spine: Secondary | ICD-10-CM | POA: Diagnosis not present

## 2012-06-12 ENCOUNTER — Ambulatory Visit (INDEPENDENT_AMBULATORY_CARE_PROVIDER_SITE_OTHER): Payer: Medicare Other | Admitting: Adult Health

## 2012-06-12 ENCOUNTER — Encounter: Payer: Self-pay | Admitting: Adult Health

## 2012-06-12 ENCOUNTER — Telehealth: Payer: Self-pay | Admitting: Internal Medicine

## 2012-06-12 VITALS — BP 122/72 | HR 76 | Temp 98.1°F | Resp 14 | Wt 171.0 lb

## 2012-06-12 DIAGNOSIS — R300 Dysuria: Secondary | ICD-10-CM

## 2012-06-12 DIAGNOSIS — R319 Hematuria, unspecified: Secondary | ICD-10-CM

## 2012-06-12 DIAGNOSIS — R21 Rash and other nonspecific skin eruption: Secondary | ICD-10-CM | POA: Insufficient documentation

## 2012-06-12 LAB — POCT URINALYSIS DIPSTICK
Bilirubin, UA: NEGATIVE
Nitrite, UA: NEGATIVE
Protein, UA: 100
Urobilinogen, UA: 0.2
pH, UA: 5.5

## 2012-06-12 MED ORDER — SULFAMETHOXAZOLE-TRIMETHOPRIM 800-160 MG PO TABS: 1.0000 | ORAL_TABLET | Freq: Two times a day (BID) | ORAL | Status: DC

## 2012-06-12 MED ORDER — FLUCONAZOLE 150 MG PO TABS: 150.0000 mg | ORAL_TABLET | Freq: Once | ORAL | Status: DC

## 2012-06-12 NOTE — Patient Instructions (Addendum)
  Start your antibiotic today.  You can also take AZO or Urostat which are sold over the counter for your urinary discomfort.   What is the urinary tract? - The urinary tract is the group of organs in the body that handle urine. The urinary tract includes the:   Kidneys, two bean-shaped organs that filter the blood to make urine  Bladder, a balloon-shaped organ that stores urine  Ureters, two tubes that carry urine from the kidneys to the bladder  Urethra, the tube that carries urine from the bladder to the outside of the body   What are urinary tract infections? - Urinary tract infections, also called "UTIs," are infections that affect either the bladder or the kidneys. Bladder infections are more common than kidney infections. Bladder infections happen when bacteria get into the urethra and travel up into the bladder. Kidney infections happen when the bacteria travel even higher, up into the kidneys. Both bladder and kidney infections are more common in women than men.   What are the symptoms of a bladder infection? - The symptoms include:   Pain or a burning feeling when you urinate  The need to urinate often  The need to urinate suddenly or in a hurry  Blood in the urine   What are the symptoms of a kidney infection? - The symptoms of a kidney infection can include the symptoms of a bladder infection, but kidney infections can also cause:   Fever  Back pain  Nausea or vomiting   How are urinary tract infections treated? - Most urinary tract infections are treated with antibiotic pills. These pills work by killing the germs that cause the infection.  If you have a bladder infection, you will probably need to take antibiotics for 3 to 7 days. If you have a kidney infection, you will probably need to take antibiotics for longer-maybe for up to 2 weeks. If you have a kidney infection, it's also possible you will need to be treated in the hospital.   Your symptoms should begin to  improve within a day of starting antibiotics. But you should finish all the antibiotic pills you get. Otherwise your infection might come back.  If needed, you can also take a medicine to numb your bladder such as AZO or Urostat which are sold over the counter.   Can cranberry juice or other cranberry products prevent bladder infections? - The studies suggesting that cranberry products prevent bladder infections are not very good. But if you want to try cranberry products for this purpose, there is probably not much harm in doing so.

## 2012-06-12 NOTE — Progress Notes (Signed)
  Subjective:    Patient ID: Maria Keller, female    DOB: Mar 29, 1950, 62 y.o.   MRN: [id number]  HPI  Patient presents with dysuria, frequency & urgency. Symptoms began this morning. She is also reporting hematuria. Denies fever, chills.   Current Outpatient Prescriptions on File Prior to Visit  Medication Sig Dispense Refill  . albuterol (PROVENTIL HFA;VENTOLIN HFA) 108 (90 BASE) MCG/ACT inhaler Inhale 2 puffs into the lungs every 6 (six) hours as needed for wheezing.  1 Inhaler  0  . aspirin 81 MG tablet Take 81 mg by mouth daily.      . calcium carbonate (TUMS - DOSED IN MG ELEMENTAL CALCIUM) 500 MG chewable tablet Chew 1 tablet by mouth as needed.      . Coenzyme Q10 (COQ10) 200 MG CAPS Take 1 capsule by mouth daily.      . fentaNYL (DURAGESIC - DOSED MCG/HR) 100 MCG/HR Place 1 patch onto the skin every other day.      . fentaNYL (DURAGESIC - DOSED MCG/HR) 25 MCG/HR Place 1 patch onto the skin every other day.      . fluticasone (FLONASE) 50 MCG/ACT nasal spray Place 2 sprays into the nose daily.  48 g  3  . furosemide (LASIX) 20 MG tablet Take 1 tablet (20 mg total) by mouth as needed.  90 tablet  1  . omeprazole (PRILOSEC) 20 MG capsule Take 1 capsule (20 mg total) by mouth 2 (two) times daily.  180 capsule  3  . sennosides-docusate sodium (SENOKOT-S) 8.6-50 MG tablet Take 1 tablet by mouth as needed.      . fluconazole (DIFLUCAN) 150 MG tablet Take 1 tablet (150 mg total) by mouth once.  1 tablet  0  . Omega-3 Fatty Acids (FISH OIL) 1360 MG CAPS Take 1 capsule by mouth daily.       No current facility-administered medications on file prior to visit.     Review of Systems  Constitutional: Negative for fever and chills.  Genitourinary: Positive for dysuria, urgency, frequency, hematuria and flank pain.   BP 122/72  Pulse 76  Temp(Src) 98.1 F (36.7 C) (Oral)  Resp 14  Wt 171 lb (77.565 kg)  BMI 26.78 kg/m2  SpO2 97%    Objective:   Physical Exam  Constitutional: She  appears well-developed and well-nourished. No distress.  Genitourinary:  Suprapubic tenderness upon palpation       Assessment & Plan:

## 2012-06-12 NOTE — Assessment & Plan Note (Signed)
Symptoms consistent with UTI. Start Septra twice a day x3 days. May also try AZO for urinary symptoms. Return in one week for urinalysis to evaluate resolution of hematuria.

## 2012-06-12 NOTE — Telephone Encounter (Signed)
Patient Information:  Caller Name: [place]  Phone: [phone]  Patient: Maria, Keller  Gender: Female  DOB: [birth year]  Age: 62 Years  PCP: Dale Roy  Office Follow Up:  Does the office need to follow up with this patient?: No  Instructions For The Office: N/A   Symptoms  Reason For Call & Symptoms: Urgency, frequency, dysuria and hematuria and fatigue.   Reviewed Health History In EMR: Yes  Reviewed Medications In EMR: Yes  Reviewed Allergies In EMR: Yes  Reviewed Surgeries / Procedures: Yes  Date of Onset of Symptoms: 06/12/2012  Treatments Tried: Drinking cranberryr juice  Treatments Tried Worked: No  Any Fever: Yes  Fever Taken: Tactile  Fever Time Of Reading: 08:35:00  Fever Last Reading: N/A  Guideline(s) Used:  Urination Pain - Female  Disposition Per Guideline:   Go to Office Now  Reason For Disposition Reached:   Severe pain with urination  Advice Given:  Fluids:   Drink extra fluids. Drink 8-10 glasses of liquids a day (Reason: to produce a dilute, non-irritating urine).  Cranberry Juice:   Some people think that drinking cranberry juice may help in fighting urinary tract infections. However, there is no good research that has ever proved this.  Dosage 100% Cranberry Juice: 1 oz (30 ml) twice a day.  Caution: Do not drink more than 16 oz (480 ml). Here is the reason: too much cranberry juice can also be irritating to the bladder.  Warm Saline SITZ Baths to Reduce Pain:  Sit in a warm saline bath for 20 minutes to cleanse the area and to reduce pain. Add 2 oz. of table salt or baking soda to a tub of water.  Call Back If:  You become worse.  Patient Will Follow Care Advice:  YES  Appointment Scheduled:  06/12/2012 09:45:00 Appointment Scheduled Provider:  Orville Govern

## 2012-06-12 NOTE — Telephone Encounter (Signed)
Has appointment today with Raquel to evaluate.

## 2012-06-14 LAB — URINE CULTURE: Colony Count: 40000

## 2012-06-18 ENCOUNTER — Telehealth: Payer: Self-pay | Admitting: Internal Medicine

## 2012-06-18 ENCOUNTER — Other Ambulatory Visit (INDEPENDENT_AMBULATORY_CARE_PROVIDER_SITE_OTHER): Payer: Medicare Other

## 2012-06-18 DIAGNOSIS — R319 Hematuria, unspecified: Secondary | ICD-10-CM

## 2012-06-18 LAB — URINALYSIS, ROUTINE W REFLEX MICROSCOPIC
Bilirubin Urine: NEGATIVE
Hgb urine dipstick: NEGATIVE
Leukocytes, UA: NEGATIVE
Nitrite: NEGATIVE
Urobilinogen, UA: 0.2 (ref 0.0–1.0)

## 2012-06-18 NOTE — Telephone Encounter (Signed)
Patient Information:  Caller Name: [name]  Phone: [phone]  Patient: Maria Keller, Maria Keller  Gender: Female  DOB: 18-Oct-1950  Age: 63 Years  PCP: Dale   Office Follow Up:  Does the office need to follow up with this patient?: Yes  Instructions For The Office: Please call regarding work in appointment within 4 hours and advise if can bring follow up urine specimen in at that time instead of 1130 lab appointment.  RN Note:  Headache began 06/17/12; was rated 8/10 however, today hadache rated 1/10. Headache remains only on right side of head. Right eye pain rated moderate or 4/10.  Right eye appears a "little swollen." Intermittent face pain feels like pins and needles or shocks.  Denies weakness of arms or legs. No difficulty with walking, talking or swallowing. No appointments remain with any provider until 1600.  Information sent to office staff for call back regarding work in appointment.    Symptoms  Reason For Call & Symptoms: Reported sudden right sided headache followed by right side of face pain that felt like pins and needles when touched, and pain behind right eye.  Symptoms are mild today compared to 06/17/12.  Reviewed Health History In EMR: Yes  Reviewed Medications In EMR: Yes  Reviewed Allergies In EMR: Yes  Reviewed Surgeries / Procedures: Yes  Date of Onset of Symptoms: 06/17/2012  Treatments Tried: Tylenol  Treatments Tried Worked: No  Guideline(s) Used:  Headache  Neurologic Deficit  Disposition Per Guideline:   Go to Office Now  Reason For Disposition Reached:   Tingling (e.g., pins and needles) of the face, arm or leg on one side of the body, that is present now  Advice Given:  Pain Medicines:  For pain relief, you can take either acetaminophen, ibuprofen, or naproxen.  They are over-the-counter (OTC) pain drugs. You can buy them at the drugstore.  Call Back If:  Symptoms do not go away within 30 minutes  You become worse.  Patient Will Follow Care  Advice:  YES

## 2012-06-18 NOTE — Telephone Encounter (Signed)
Please put her on the schedule at 12:00 tomorrow.  Work in for the problem today.  Pt stated she felt comfortable waiting until tomorrow.  She will go to ER this pm if any sx change.  Feeling some better now.  [name] has another message on a pt to be put in at 11:00 - so put Ms Maria Keller in at 12:00.  Thanks.

## 2012-06-18 NOTE — Telephone Encounter (Signed)
Patient states she still  has headache but not as bad as reported 5/13, reports no swelling today, advised patient if swelling and headache she for two days ahe should go to the ER. Patient refused advise. Patient is coming into lab at 1130 for UA for previous UTI

## 2012-06-18 NOTE — Telephone Encounter (Signed)
Patient came into office to for lab visit ask to be seen no appointment s available checked vitals Bp 126/78 P 82 R 16 O2 sat @ 96 T 99 patient reported s/s of slight headache, Rt eye pain, and electrical shocks in facial area when touched advisedd patient she should go to the ER or urgent care or that I could arrange appointment with one of the providers here tomorrow patient refused.

## 2012-06-19 ENCOUNTER — Encounter: Payer: Self-pay | Admitting: Internal Medicine

## 2012-06-19 ENCOUNTER — Ambulatory Visit (INDEPENDENT_AMBULATORY_CARE_PROVIDER_SITE_OTHER): Payer: Medicare Other | Admitting: Internal Medicine

## 2012-06-19 VITALS — BP 120/80 | HR 82 | Temp 98.4°F | Ht 67.0 in | Wt 181.0 lb

## 2012-06-19 DIAGNOSIS — R51 Headache: Secondary | ICD-10-CM | POA: Diagnosis not present

## 2012-06-19 DIAGNOSIS — E7800 Pure hypercholesterolemia, unspecified: Secondary | ICD-10-CM | POA: Diagnosis not present

## 2012-06-19 DIAGNOSIS — C8589 Other specified types of non-Hodgkin lymphoma, extranodal and solid organ sites: Secondary | ICD-10-CM | POA: Diagnosis not present

## 2012-06-19 DIAGNOSIS — C8590 Non-Hodgkin lymphoma, unspecified, unspecified site: Secondary | ICD-10-CM

## 2012-06-19 LAB — CBC WITH DIFFERENTIAL/PLATELET
Basophils Relative: 0.4 % (ref 0.0–3.0)
Eosinophils Absolute: 0.2 10*3/uL (ref 0.0–0.7)
Eosinophils Relative: 1.8 % (ref 0.0–5.0)
Hemoglobin: 12.9 g/dL (ref 12.0–15.0)
MCHC: 33.6 g/dL (ref 30.0–36.0)
MCV: 83.4 fl (ref 78.0–100.0)
Monocytes Absolute: 0.9 10*3/uL (ref 0.1–1.0)
Neutro Abs: 3.3 10*3/uL (ref 1.4–7.7)
RBC: 4.58 Mil/uL (ref 3.87–5.11)

## 2012-06-19 LAB — BASIC METABOLIC PANEL
BUN: 11 mg/dL (ref 6–23)
CO2: 36 mEq/L — ABNORMAL HIGH (ref 19–32)
Calcium: 9.6 mg/dL (ref 8.4–10.5)
GFR: 76.86 mL/min (ref >60.00)
Glucose, Bld: 88 mg/dL (ref 70–99)
Potassium: 4.6 mEq/L (ref 3.5–5.1)

## 2012-06-19 MED ORDER — VALACYCLOVIR HCL 1 G PO TABS: 1000.0000 mg | ORAL_TABLET | Freq: Three times a day (TID) | ORAL | Status: DC

## 2012-06-23 ENCOUNTER — Encounter: Payer: Self-pay | Admitting: Internal Medicine

## 2012-06-23 NOTE — Assessment & Plan Note (Signed)
Low cholesterol diet and exercise.  Had problems with statin medication previously.  Follow lipid panel.

## 2012-06-23 NOTE — Assessment & Plan Note (Signed)
Being followed by Dr DeCastro.  Has been stable.   

## 2012-06-23 NOTE — Progress Notes (Signed)
Subjective:    Patient ID: Maria Keller, female    DOB: [birth year], 62 y.o.   MRN: [id number]  Headache   62 year old female with past history of fibromyalgia, GERD, depression and follicular lymphoma who comes in today as a work in with concerns regarding soreness right lateral neck and a sensation change - right side of her face.  Symptoms started over the last couple of days.  The intensity of the sensation change (right side of her face) has improved.  States when she touches her face - feels a different sensation.  No facial drooping.  No slurring of speech with this episode.  Today noticed soreness - right lateral neck - under her ear.  No significant headache.  No cough.  No fever.  No rash. Breathing stable.     Past Medical History  Diagnosis Date  . 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 Anemia     iron deficiency and B12 deficiency  . Hypercholesterolemia   . Shingles outbreak 11/26/2011  . IBS (irritable bowel syndrome)   . Chronic back pain     followed by Dr Metta Clines  . Nephrolithiasis     followed by Dr Lonna Cobb, s/p stents  . Esophageal stricture     requiring dilatation x 2  . Hiatal hernia   . Depression   . Fibromyalgia   . GERD (gastroesophageal reflux disease)     Current Outpatient Prescriptions on File Prior to Visit  Medication Sig Dispense Refill  . albuterol (PROVENTIL HFA;VENTOLIN HFA) 108 (90 BASE) MCG/ACT inhaler Inhale 2 puffs into the lungs every 6 (six) hours as needed for wheezing.  1 Inhaler  0  . aspirin 81 MG tablet Take 81 mg by mouth daily.      . calcium carbonate (TUMS - DOSED IN MG ELEMENTAL CALCIUM) 500 MG chewable tablet Chew 1 tablet by mouth as needed.      . Coenzyme Q10 (COQ10) 200 MG CAPS Take 1 capsule by mouth daily.      Marland Kitchen escitalopram (LEXAPRO) 20 MG tablet Take 20 mg by mouth daily.      . fentaNYL (DURAGESIC - DOSED MCG/HR) 100 MCG/HR Place 1 patch onto the skin every other day.      . fentaNYL  (DURAGESIC - DOSED MCG/HR) 25 MCG/HR Place 1 patch onto the skin every other day.      . fluticasone (FLONASE) 50 MCG/ACT nasal spray Place 2 sprays into the nose daily.  48 g  3  . furosemide (LASIX) 20 MG tablet Take 1 tablet (20 mg total) by mouth as needed.  90 tablet  1  . Omega-3 Fatty Acids (FISH OIL) 1360 MG CAPS Take 1 capsule by mouth daily.      Marland Kitchen omeprazole (PRILOSEC) 20 MG capsule Take 1 capsule (20 mg total) by mouth 2 (two) times daily.  180 capsule  3  . sennosides-docusate sodium (SENOKOT-S) 8.6-50 MG tablet Take 1 tablet by mouth as needed.       No current facility-administered medications on file prior to visit.    Review of Systems  Neurological: Positive for headaches.  Patient denies any significant headache.  Minima headache this am.  No lightheadedness or dizziness.  Sensation change - right side of her face.  No facial drooping.  Soreness - right lateral neck.  Started today.  No rash.  No other neurological changes.  No chest pain, tightness or palpitations.  No  increased shortness of breath, cough or congestion.  No nausea or vomiting.  No abdominal pain or cramping.  No bowel change, such as diarrhea, constipation, BRBPR or melana.  No urine change.  She has noticed (over the last year) feeling of not remembering as well.  Sometimes has trouble finding the word she wants to say.  Some increased fatigue last week - question of slurring of speech vs fatigue.  Husband states she will sound a little slurred when she is tired.  She is accompanied by her husband.  History obtained from both of them.      Objective:   Physical Exam  Filed Vitals:   06/19/12 1156  BP: 120/80  Pulse: 82  Temp: 98.4 F (23.75 C)   62 year old female in no acute distress.   HEENT:  Nares- clear.  Oropharynx - without lesions.  No tenderness to palpation over the maxillary sinus or frontal sinus.  Minimal tenderness to palpation right lateral neck - just beneath the ear.   NECK:  Supple.   Nontender.  No audible bruit.  HEART:  Appears to be regular. LUNGS:  No crackles or wheezing audible.  Respirations even and unlabored.  RADIAL PULSE:  Equal bilaterally.  ABDOMEN:  Soft, nontender.  Bowel sounds present and normal.  No audible abdominal bruit.   EXTREMITIES:  No increased edema present.  DP pulses palpable and equal bilaterally.      SKIN:  No rash on her face.  NEURO:  No facial drooping.  CNs appear to be intact.  Some minimal decreased sensation right side face.      Assessment & Plan:  ACUTE SENSATION CHANGE RIGHT SIDE OF FACE.  Describes a burning/stinging sensation.  Sensation change.  No rash.  Have to consider the possibility of shingles.  Will treat with valtrex 1000mg  tid x 1 week.  Will check ESR and CBC.  Tylenol as directed.  Hold on abx.  Follow closely.  Will be reevaluated if symptoms change or worsen.    NEURO.  Symptoms as outlined.  Exam as outlined.  Discussed the possibility of a neurological etiology.  Discussed possible MRI.  Will treat as outlined above.  Will be reevaluated if symptoms change, worsen or do not resolve.  Pt and her husband - comfortable with this plan.    PSYCH.  Continues to follow up with Dr Imogene Burn.  Stable.   CARDIOVASCULAR.  ECHO 02/23/08 - normal LV function with mild right ventricle enlargement.  Saw Dr Darrold Junker.  No evidence of ischemia on ETT myoview.  Currently asymptomatic.  Follow.   HEALTH MAINTENANCE.  Physical last visit.  S/P hysterectomy.  Does not need yearly pap smears.  Mammograms done through [facility].  Saw GI and they felt no further w/up warranted.

## 2012-06-26 DIAGNOSIS — Z1231 Encounter for screening mammogram for malignant neoplasm of breast: Secondary | ICD-10-CM | POA: Diagnosis not present

## 2012-06-28 ENCOUNTER — Encounter: Payer: Self-pay | Admitting: Internal Medicine

## 2012-07-10 ENCOUNTER — Ambulatory Visit: Payer: Self-pay | Admitting: Pain Medicine

## 2012-07-10 DIAGNOSIS — M62838 Other muscle spasm: Secondary | ICD-10-CM | POA: Diagnosis not present

## 2012-07-10 DIAGNOSIS — B0229 Other postherpetic nervous system involvement: Secondary | ICD-10-CM | POA: Diagnosis not present

## 2012-07-10 DIAGNOSIS — M76899 Other specified enthesopathies of unspecified lower limb, excluding foot: Secondary | ICD-10-CM | POA: Diagnosis not present

## 2012-07-10 DIAGNOSIS — IMO0002 Reserved for concepts with insufficient information to code with codable children: Secondary | ICD-10-CM | POA: Diagnosis not present

## 2012-07-10 DIAGNOSIS — Z79899 Other long term (current) drug therapy: Secondary | ICD-10-CM | POA: Diagnosis not present

## 2012-07-10 DIAGNOSIS — M47817 Spondylosis without myelopathy or radiculopathy, lumbosacral region: Secondary | ICD-10-CM | POA: Diagnosis not present

## 2012-07-10 DIAGNOSIS — M546 Pain in thoracic spine: Secondary | ICD-10-CM | POA: Diagnosis not present

## 2012-08-07 ENCOUNTER — Ambulatory Visit: Payer: Self-pay | Admitting: Pain Medicine

## 2012-08-07 DIAGNOSIS — M76899 Other specified enthesopathies of unspecified lower limb, excluding foot: Secondary | ICD-10-CM | POA: Diagnosis not present

## 2012-08-07 DIAGNOSIS — C8589 Other specified types of non-Hodgkin lymphoma, extranodal and solid organ sites: Secondary | ICD-10-CM | POA: Diagnosis not present

## 2012-08-07 DIAGNOSIS — B0229 Other postherpetic nervous system involvement: Secondary | ICD-10-CM | POA: Diagnosis not present

## 2012-08-07 DIAGNOSIS — IMO0002 Reserved for concepts with insufficient information to code with codable children: Secondary | ICD-10-CM | POA: Diagnosis not present

## 2012-08-07 DIAGNOSIS — M546 Pain in thoracic spine: Secondary | ICD-10-CM | POA: Diagnosis not present

## 2012-08-07 DIAGNOSIS — M47817 Spondylosis without myelopathy or radiculopathy, lumbosacral region: Secondary | ICD-10-CM | POA: Diagnosis not present

## 2012-08-07 DIAGNOSIS — Z79899 Other long term (current) drug therapy: Secondary | ICD-10-CM | POA: Diagnosis not present

## 2012-08-07 DIAGNOSIS — M5137 Other intervertebral disc degeneration, lumbosacral region: Secondary | ICD-10-CM | POA: Diagnosis not present

## 2012-08-15 ENCOUNTER — Other Ambulatory Visit (INDEPENDENT_AMBULATORY_CARE_PROVIDER_SITE_OTHER): Payer: Medicare Other

## 2012-08-15 DIAGNOSIS — E7800 Pure hypercholesterolemia, unspecified: Secondary | ICD-10-CM | POA: Diagnosis not present

## 2012-08-15 LAB — LIPID PANEL: VLDL: 37 mg/dL (ref 0.0–40.0)

## 2012-08-18 ENCOUNTER — Encounter: Payer: Self-pay | Admitting: Internal Medicine

## 2012-08-18 ENCOUNTER — Ambulatory Visit (INDEPENDENT_AMBULATORY_CARE_PROVIDER_SITE_OTHER): Payer: Medicare Other | Admitting: Internal Medicine

## 2012-08-18 VITALS — BP 100/70 | HR 79 | Temp 98.5°F | Ht 67.0 in | Wt 179.5 lb

## 2012-08-18 DIAGNOSIS — G8929 Other chronic pain: Secondary | ICD-10-CM

## 2012-08-18 DIAGNOSIS — E7800 Pure hypercholesterolemia, unspecified: Secondary | ICD-10-CM

## 2012-08-18 DIAGNOSIS — D649 Anemia, unspecified: Secondary | ICD-10-CM | POA: Diagnosis not present

## 2012-08-18 DIAGNOSIS — R5383 Other fatigue: Secondary | ICD-10-CM

## 2012-08-18 DIAGNOSIS — C8590 Non-Hodgkin lymphoma, unspecified, unspecified site: Secondary | ICD-10-CM

## 2012-08-18 DIAGNOSIS — M797 Fibromyalgia: Secondary | ICD-10-CM

## 2012-08-18 DIAGNOSIS — K219 Gastro-esophageal reflux disease without esophagitis: Secondary | ICD-10-CM

## 2012-08-18 DIAGNOSIS — M549 Dorsalgia, unspecified: Secondary | ICD-10-CM

## 2012-08-18 DIAGNOSIS — C8589 Other specified types of non-Hodgkin lymphoma, extranodal and solid organ sites: Secondary | ICD-10-CM | POA: Diagnosis not present

## 2012-08-18 DIAGNOSIS — IMO0001 Reserved for inherently not codable concepts without codable children: Secondary | ICD-10-CM

## 2012-08-18 MED ORDER — FLUTICASONE PROPIONATE 50 MCG/ACT NA SUSP: 2.0000 | Freq: Every day | NASAL | Status: DC

## 2012-08-18 MED ORDER — FUROSEMIDE 20 MG PO TABS: 20.0000 mg | ORAL_TABLET | ORAL | Status: DC | PRN

## 2012-08-19 DIAGNOSIS — F329 Major depressive disorder, single episode, unspecified: Secondary | ICD-10-CM | POA: Diagnosis not present

## 2012-08-19 DIAGNOSIS — F4542 Pain disorder with related psychological factors: Secondary | ICD-10-CM | POA: Diagnosis not present

## 2012-08-20 ENCOUNTER — Encounter: Payer: Self-pay | Admitting: Internal Medicine

## 2012-08-20 NOTE — Assessment & Plan Note (Signed)
Low cholesterol diet and exercise.  Had problems with statin medication previously.  Follow lipid panel.

## 2012-08-20 NOTE — Assessment & Plan Note (Signed)
Stable

## 2012-08-20 NOTE — Assessment & Plan Note (Signed)
Being followed at the pain clinic - Dr Crisp.    

## 2012-08-20 NOTE — Assessment & Plan Note (Signed)
On omeprazole.  Symptoms controlled.  Follow.   

## 2012-08-20 NOTE — Progress Notes (Signed)
Subjective:    Patient ID: Maria Keller, female    DOB: 08-30-1950, 62 y.o.   MRN: [id number]  HPI 62 year old female with past history of fibromyalgia, GERD, depression and follicular lymphoma who comes in today for a scheduled follow up.  She states she is doing relatively well.  The previous pain and sensation change involving her head - resolved.  Doing well.  No significant sinus or allergy symptoms.   No chest congestion or cough.  No nausea or vomiting.  No abdominal pain.  Bowels stable.  She had a rectocele repair last year.  Did well after the surgery.  She does report that now she is starting to feel increased pressure - rectum.  Some pressure with intercourse.  Some vaginal pressure.  No vaginal itching or burning.  No discharge.     Past Medical History  Diagnosis Date  . 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 Anemia     iron deficiency and B12 deficiency  . Hypercholesterolemia   . Shingles outbreak 11/26/2011  . IBS (irritable bowel syndrome)   . Chronic back pain     followed by Dr Metta Clines  . Nephrolithiasis     followed by Dr Lonna Cobb, s/p stents  . Esophageal stricture     requiring dilatation x 2  . Hiatal hernia   . Depression   . Fibromyalgia   . GERD (gastroesophageal reflux disease)     Current Outpatient Prescriptions on File Prior to Visit  Medication Sig Dispense Refill  . albuterol (PROVENTIL HFA;VENTOLIN HFA) 108 (90 BASE) MCG/ACT inhaler Inhale 2 puffs into the lungs every 6 (six) hours as needed for wheezing.  1 Inhaler  0  . aspirin 81 MG tablet Take 81 mg by mouth daily.      . calcium carbonate (TUMS - DOSED IN MG ELEMENTAL CALCIUM) 500 MG chewable tablet Chew 1 tablet by mouth as needed.      . Coenzyme Q10 (COQ10) 200 MG CAPS Take 1 capsule by mouth daily.      Marland Kitchen escitalopram (LEXAPRO) 20 MG tablet Take 20 mg by mouth daily.      . fentaNYL (DURAGESIC - DOSED MCG/HR) 100 MCG/HR Place 1 patch onto the skin every other  day.      . fentaNYL (DURAGESIC - DOSED MCG/HR) 25 MCG/HR Place 1 patch onto the skin every other day.      . Omega-3 Fatty Acids (FISH OIL) 1360 MG CAPS Take 1 capsule by mouth daily.      Marland Kitchen omeprazole (PRILOSEC) 20 MG capsule Take 1 capsule (20 mg total) by mouth 2 (two) times daily.  180 capsule  3  . sennosides-docusate sodium (SENOKOT-S) 8.6-50 MG tablet Take 1 tablet by mouth as needed.       No current facility-administered medications on file prior to visit.    Review of Systems Patient denies any headache, lightheadedness or dizziness.  No sinus or allergy symptoms.   No chest pain, tightness or palpitations.  No increased shortness of breath, cough or congestion.  No nausea or vomiting.  No abdominal pain or cramping.  No bowel change, such as diarrhea, constipation, BRBPR or melana.  She does report the pressure as outlined.  Feels similar to her previous issues she had prior to her rectocele repair.   No urine change.        Objective:   Physical Exam  Filed Vitals:   08/18/12 7829  BP: 100/70  Pulse: 79  Temp: 98.5 F (75.23 C)   62 year old female in no acute distress.   HEENT:  Nares- clear.  Oropharynx - without lesions.  NECK:  Supple.  Nontender.  No audible bruit.  HEART:  Appears to be regular. LUNGS:  No crackles or wheezing audible.  Respirations even and unlabored.  RADIAL PULSE:  Equal bilaterally.   ABDOMEN:  Soft, nontender.  Bowel sounds present and normal.  No audible abdominal bruit.  GU:  Normal external genitalia.  Vaginal vault without lesions.  S/P hysterectomy.  Could not appreciate any adnexal masses or tenderness.  Appears to have rectocele.  EXTREMITIES:  No increased edema present.  DP pulses palpable and equal bilaterally.          Assessment & Plan:  RECTOCELE.  Exam as outlined.  Is s/p previous rectocele surgery.  Offered referral back to Dr Greggory Keen given reoccurring symptoms/problems.  She declines at this time.  Wants to follow.  Will  notify me if she changes her mind.    PSYCH.  Continues to follow up with Dr Imogene Burn.  Stable.   CARDIOVASCULAR.  ECHO 02/23/08 - normal LV function with mild right ventricle enlargement.  Saw Dr Darrold Junker.  No evidence of ischemia on ETT myoview.  Currently asymptomatic.  Follow.   HEALTH MAINTENANCE.  Physical 02/15/12.  S/P hysterectomy.  Does not need yearly pap smears.  Mammograms done through [facility].  Saw GI and they felt no further w/up warranted.

## 2012-08-20 NOTE — Assessment & Plan Note (Signed)
Being followed by Dr DeCastro.  Has been stable.   

## 2012-08-20 NOTE — Assessment & Plan Note (Addendum)
Was being followed at the cancer center.  Follow cbc and ferritin.  Hgb 12.9 - 5/14.

## 2012-09-09 ENCOUNTER — Ambulatory Visit: Payer: Self-pay | Admitting: Pain Medicine

## 2012-09-09 DIAGNOSIS — B0229 Other postherpetic nervous system involvement: Secondary | ICD-10-CM | POA: Diagnosis not present

## 2012-09-09 DIAGNOSIS — M5137 Other intervertebral disc degeneration, lumbosacral region: Secondary | ICD-10-CM | POA: Diagnosis not present

## 2012-09-09 DIAGNOSIS — M461 Sacroiliitis, not elsewhere classified: Secondary | ICD-10-CM | POA: Diagnosis not present

## 2012-09-09 DIAGNOSIS — M76899 Other specified enthesopathies of unspecified lower limb, excluding foot: Secondary | ICD-10-CM | POA: Diagnosis not present

## 2012-09-09 DIAGNOSIS — Z87898 Personal history of other specified conditions: Secondary | ICD-10-CM | POA: Diagnosis not present

## 2012-09-09 DIAGNOSIS — M546 Pain in thoracic spine: Secondary | ICD-10-CM | POA: Diagnosis not present

## 2012-09-09 DIAGNOSIS — IMO0002 Reserved for concepts with insufficient information to code with codable children: Secondary | ICD-10-CM | POA: Diagnosis not present

## 2012-09-09 DIAGNOSIS — M47817 Spondylosis without myelopathy or radiculopathy, lumbosacral region: Secondary | ICD-10-CM | POA: Diagnosis not present

## 2012-09-09 DIAGNOSIS — Z79899 Other long term (current) drug therapy: Secondary | ICD-10-CM | POA: Diagnosis not present

## 2012-09-30 DIAGNOSIS — N952 Postmenopausal atrophic vaginitis: Secondary | ICD-10-CM | POA: Diagnosis not present

## 2012-09-30 DIAGNOSIS — N816 Rectocele: Secondary | ICD-10-CM | POA: Diagnosis not present

## 2012-10-07 ENCOUNTER — Ambulatory Visit: Payer: Self-pay | Admitting: Pain Medicine

## 2012-10-07 DIAGNOSIS — M5137 Other intervertebral disc degeneration, lumbosacral region: Secondary | ICD-10-CM | POA: Diagnosis not present

## 2012-10-07 DIAGNOSIS — M76899 Other specified enthesopathies of unspecified lower limb, excluding foot: Secondary | ICD-10-CM | POA: Diagnosis not present

## 2012-10-07 DIAGNOSIS — M47817 Spondylosis without myelopathy or radiculopathy, lumbosacral region: Secondary | ICD-10-CM | POA: Diagnosis not present

## 2012-10-07 DIAGNOSIS — C8589 Other specified types of non-Hodgkin lymphoma, extranodal and solid organ sites: Secondary | ICD-10-CM | POA: Diagnosis not present

## 2012-10-07 DIAGNOSIS — IMO0002 Reserved for concepts with insufficient information to code with codable children: Secondary | ICD-10-CM | POA: Diagnosis not present

## 2012-10-07 DIAGNOSIS — M546 Pain in thoracic spine: Secondary | ICD-10-CM | POA: Diagnosis not present

## 2012-10-07 DIAGNOSIS — B0229 Other postherpetic nervous system involvement: Secondary | ICD-10-CM | POA: Diagnosis not present

## 2012-10-07 DIAGNOSIS — Z79899 Other long term (current) drug therapy: Secondary | ICD-10-CM | POA: Diagnosis not present

## 2012-10-28 DIAGNOSIS — N952 Postmenopausal atrophic vaginitis: Secondary | ICD-10-CM | POA: Diagnosis not present

## 2012-10-28 DIAGNOSIS — Z23 Encounter for immunization: Secondary | ICD-10-CM | POA: Diagnosis not present

## 2012-10-28 DIAGNOSIS — N815 Vaginal enterocele: Secondary | ICD-10-CM | POA: Diagnosis not present

## 2012-10-28 DIAGNOSIS — N816 Rectocele: Secondary | ICD-10-CM | POA: Diagnosis not present

## 2012-11-06 ENCOUNTER — Ambulatory Visit: Payer: Self-pay | Admitting: Obstetrics and Gynecology

## 2012-11-06 ENCOUNTER — Ambulatory Visit: Payer: Self-pay | Admitting: Pain Medicine

## 2012-11-06 DIAGNOSIS — M461 Sacroiliitis, not elsewhere classified: Secondary | ICD-10-CM | POA: Diagnosis not present

## 2012-11-06 DIAGNOSIS — N816 Rectocele: Secondary | ICD-10-CM | POA: Diagnosis not present

## 2012-11-06 DIAGNOSIS — Z01818 Encounter for other preprocedural examination: Secondary | ICD-10-CM | POA: Diagnosis not present

## 2012-11-06 DIAGNOSIS — N952 Postmenopausal atrophic vaginitis: Secondary | ICD-10-CM | POA: Diagnosis not present

## 2012-11-06 DIAGNOSIS — IMO0002 Reserved for concepts with insufficient information to code with codable children: Secondary | ICD-10-CM | POA: Diagnosis not present

## 2012-11-06 DIAGNOSIS — Z01812 Encounter for preprocedural laboratory examination: Secondary | ICD-10-CM | POA: Diagnosis not present

## 2012-11-06 DIAGNOSIS — M76899 Other specified enthesopathies of unspecified lower limb, excluding foot: Secondary | ICD-10-CM | POA: Diagnosis not present

## 2012-11-06 DIAGNOSIS — M47817 Spondylosis without myelopathy or radiculopathy, lumbosacral region: Secondary | ICD-10-CM | POA: Diagnosis not present

## 2012-11-06 DIAGNOSIS — Z79899 Other long term (current) drug therapy: Secondary | ICD-10-CM | POA: Diagnosis not present

## 2012-11-06 DIAGNOSIS — R9431 Abnormal electrocardiogram [ECG] [EKG]: Secondary | ICD-10-CM | POA: Diagnosis not present

## 2012-11-06 DIAGNOSIS — K469 Unspecified abdominal hernia without obstruction or gangrene: Secondary | ICD-10-CM | POA: Diagnosis not present

## 2012-11-06 DIAGNOSIS — M546 Pain in thoracic spine: Secondary | ICD-10-CM | POA: Diagnosis not present

## 2012-11-06 DIAGNOSIS — M5137 Other intervertebral disc degeneration, lumbosacral region: Secondary | ICD-10-CM | POA: Diagnosis not present

## 2012-11-06 LAB — CBC
HCT: 35.8 % (ref 35.0–47.0)
HGB: 12.4 g/dL (ref 12.0–16.0)
MCH: 28.2 pg (ref 26.0–34.0)
MCHC: 34.5 g/dL (ref 32.0–36.0)
Platelet: 187 10*3/uL (ref 150–440)
RBC: 4.38 10*6/uL (ref 3.80–5.20)
RDW: 14.9 % — ABNORMAL HIGH (ref 11.5–14.5)
WBC: 8.7 10*3/uL (ref 3.6–11.0)

## 2012-11-06 LAB — BASIC METABOLIC PANEL
Anion Gap: 3 — ABNORMAL LOW (ref 7–16)
BUN: 13 mg/dL (ref 7–18)
Calcium, Total: 9 mg/dL (ref 8.5–10.1)
Chloride: 100 mmol/L (ref 98–107)
Creatinine: 0.9 mg/dL (ref 0.60–1.30)
EGFR (African American): >60
Osmolality: 275 (ref 275–301)
Potassium: 4 mmol/L (ref 3.5–5.1)
Sodium: 138 mmol/L (ref 136–145)

## 2012-11-10 ENCOUNTER — Ambulatory Visit: Payer: Self-pay | Admitting: Obstetrics and Gynecology

## 2012-11-10 DIAGNOSIS — N816 Rectocele: Secondary | ICD-10-CM | POA: Diagnosis not present

## 2012-11-10 DIAGNOSIS — K660 Peritoneal adhesions (postprocedural) (postinfection): Secondary | ICD-10-CM | POA: Diagnosis not present

## 2012-11-10 DIAGNOSIS — F329 Major depressive disorder, single episode, unspecified: Secondary | ICD-10-CM | POA: Diagnosis not present

## 2012-11-10 DIAGNOSIS — Z87898 Personal history of other specified conditions: Secondary | ICD-10-CM | POA: Diagnosis not present

## 2012-11-10 DIAGNOSIS — Z90710 Acquired absence of both cervix and uterus: Secondary | ICD-10-CM | POA: Diagnosis not present

## 2012-11-10 DIAGNOSIS — Z885 Allergy status to narcotic agent status: Secondary | ICD-10-CM | POA: Diagnosis not present

## 2012-11-10 DIAGNOSIS — Z888 Allergy status to other drugs, medicaments and biological substances status: Secondary | ICD-10-CM | POA: Diagnosis not present

## 2012-11-10 DIAGNOSIS — Z881 Allergy status to other antibiotic agents status: Secondary | ICD-10-CM | POA: Diagnosis not present

## 2012-11-10 DIAGNOSIS — N815 Vaginal enterocele: Secondary | ICD-10-CM | POA: Diagnosis not present

## 2012-11-10 DIAGNOSIS — K589 Irritable bowel syndrome without diarrhea: Secondary | ICD-10-CM | POA: Diagnosis not present

## 2012-11-10 DIAGNOSIS — IMO0001 Reserved for inherently not codable concepts without codable children: Secondary | ICD-10-CM | POA: Diagnosis not present

## 2012-11-10 DIAGNOSIS — Z880 Allergy status to penicillin: Secondary | ICD-10-CM | POA: Diagnosis not present

## 2012-11-11 DIAGNOSIS — K589 Irritable bowel syndrome without diarrhea: Secondary | ICD-10-CM | POA: Diagnosis not present

## 2012-11-11 DIAGNOSIS — IMO0001 Reserved for inherently not codable concepts without codable children: Secondary | ICD-10-CM | POA: Diagnosis not present

## 2012-11-11 DIAGNOSIS — K660 Peritoneal adhesions (postprocedural) (postinfection): Secondary | ICD-10-CM | POA: Diagnosis not present

## 2012-11-11 DIAGNOSIS — Z90710 Acquired absence of both cervix and uterus: Secondary | ICD-10-CM | POA: Diagnosis not present

## 2012-11-11 DIAGNOSIS — F329 Major depressive disorder, single episode, unspecified: Secondary | ICD-10-CM | POA: Diagnosis not present

## 2012-11-11 DIAGNOSIS — N815 Vaginal enterocele: Secondary | ICD-10-CM | POA: Diagnosis not present

## 2012-11-11 LAB — HEMOGLOBIN: HGB: 10.8 g/dL — ABNORMAL LOW

## 2012-11-17 DIAGNOSIS — R350 Frequency of micturition: Secondary | ICD-10-CM | POA: Diagnosis not present

## 2012-11-17 DIAGNOSIS — Z4801 Encounter for change or removal of surgical wound dressing: Secondary | ICD-10-CM | POA: Diagnosis not present

## 2012-11-20 DIAGNOSIS — F329 Major depressive disorder, single episode, unspecified: Secondary | ICD-10-CM | POA: Diagnosis not present

## 2012-11-24 DIAGNOSIS — C8589 Other specified types of non-Hodgkin lymphoma, extranodal and solid organ sites: Secondary | ICD-10-CM | POA: Diagnosis not present

## 2012-12-01 DIAGNOSIS — N390 Urinary tract infection, site not specified: Secondary | ICD-10-CM | POA: Diagnosis not present

## 2012-12-01 DIAGNOSIS — Z87442 Personal history of urinary calculi: Secondary | ICD-10-CM | POA: Diagnosis not present

## 2012-12-09 ENCOUNTER — Ambulatory Visit: Payer: Self-pay | Admitting: Pain Medicine

## 2012-12-09 DIAGNOSIS — M7150 Other bursitis, not elsewhere classified, unspecified site: Secondary | ICD-10-CM | POA: Diagnosis not present

## 2012-12-09 DIAGNOSIS — M76899 Other specified enthesopathies of unspecified lower limb, excluding foot: Secondary | ICD-10-CM | POA: Diagnosis not present

## 2012-12-09 DIAGNOSIS — M461 Sacroiliitis, not elsewhere classified: Secondary | ICD-10-CM | POA: Diagnosis not present

## 2012-12-09 DIAGNOSIS — B0229 Other postherpetic nervous system involvement: Secondary | ICD-10-CM | POA: Diagnosis not present

## 2012-12-09 DIAGNOSIS — M546 Pain in thoracic spine: Secondary | ICD-10-CM | POA: Diagnosis not present

## 2012-12-09 DIAGNOSIS — Z79899 Other long term (current) drug therapy: Secondary | ICD-10-CM | POA: Diagnosis not present

## 2012-12-09 DIAGNOSIS — M47817 Spondylosis without myelopathy or radiculopathy, lumbosacral region: Secondary | ICD-10-CM | POA: Diagnosis not present

## 2012-12-09 DIAGNOSIS — IMO0002 Reserved for concepts with insufficient information to code with codable children: Secondary | ICD-10-CM | POA: Diagnosis not present

## 2012-12-09 DIAGNOSIS — M5137 Other intervertebral disc degeneration, lumbosacral region: Secondary | ICD-10-CM | POA: Diagnosis not present

## 2013-01-06 ENCOUNTER — Ambulatory Visit: Payer: Self-pay | Admitting: Pain Medicine

## 2013-01-06 DIAGNOSIS — M5137 Other intervertebral disc degeneration, lumbosacral region: Secondary | ICD-10-CM | POA: Diagnosis not present

## 2013-01-06 DIAGNOSIS — M546 Pain in thoracic spine: Secondary | ICD-10-CM | POA: Diagnosis not present

## 2013-01-06 DIAGNOSIS — M76899 Other specified enthesopathies of unspecified lower limb, excluding foot: Secondary | ICD-10-CM | POA: Diagnosis not present

## 2013-01-06 DIAGNOSIS — Z87898 Personal history of other specified conditions: Secondary | ICD-10-CM | POA: Diagnosis not present

## 2013-01-06 DIAGNOSIS — M47817 Spondylosis without myelopathy or radiculopathy, lumbosacral region: Secondary | ICD-10-CM | POA: Diagnosis not present

## 2013-01-06 DIAGNOSIS — Z79899 Other long term (current) drug therapy: Secondary | ICD-10-CM | POA: Diagnosis not present

## 2013-01-06 DIAGNOSIS — M62838 Other muscle spasm: Secondary | ICD-10-CM | POA: Diagnosis not present

## 2013-01-06 DIAGNOSIS — IMO0002 Reserved for concepts with insufficient information to code with codable children: Secondary | ICD-10-CM | POA: Diagnosis not present

## 2013-02-09 ENCOUNTER — Ambulatory Visit: Payer: Self-pay | Admitting: Pain Medicine

## 2013-02-09 DIAGNOSIS — M47817 Spondylosis without myelopathy or radiculopathy, lumbosacral region: Secondary | ICD-10-CM | POA: Diagnosis not present

## 2013-02-09 DIAGNOSIS — Z79899 Other long term (current) drug therapy: Secondary | ICD-10-CM | POA: Diagnosis not present

## 2013-02-09 DIAGNOSIS — M76899 Other specified enthesopathies of unspecified lower limb, excluding foot: Secondary | ICD-10-CM | POA: Diagnosis not present

## 2013-02-09 DIAGNOSIS — Z87898 Personal history of other specified conditions: Secondary | ICD-10-CM | POA: Diagnosis not present

## 2013-02-09 DIAGNOSIS — IMO0001 Reserved for inherently not codable concepts without codable children: Secondary | ICD-10-CM | POA: Diagnosis not present

## 2013-02-09 DIAGNOSIS — M461 Sacroiliitis, not elsewhere classified: Secondary | ICD-10-CM | POA: Diagnosis not present

## 2013-02-09 DIAGNOSIS — IMO0002 Reserved for concepts with insufficient information to code with codable children: Secondary | ICD-10-CM | POA: Diagnosis not present

## 2013-02-09 DIAGNOSIS — M5137 Other intervertebral disc degeneration, lumbosacral region: Secondary | ICD-10-CM | POA: Diagnosis not present

## 2013-02-09 DIAGNOSIS — M546 Pain in thoracic spine: Secondary | ICD-10-CM | POA: Diagnosis not present

## 2013-02-11 ENCOUNTER — Encounter (INDEPENDENT_AMBULATORY_CARE_PROVIDER_SITE_OTHER): Payer: Self-pay

## 2013-02-11 ENCOUNTER — Other Ambulatory Visit (INDEPENDENT_AMBULATORY_CARE_PROVIDER_SITE_OTHER): Payer: Medicare Other

## 2013-02-11 DIAGNOSIS — R5381 Other malaise: Secondary | ICD-10-CM | POA: Diagnosis not present

## 2013-02-11 DIAGNOSIS — E7800 Pure hypercholesterolemia, unspecified: Secondary | ICD-10-CM | POA: Diagnosis not present

## 2013-02-11 DIAGNOSIS — C8590 Non-Hodgkin lymphoma, unspecified, unspecified site: Secondary | ICD-10-CM

## 2013-02-11 DIAGNOSIS — R5383 Other fatigue: Secondary | ICD-10-CM

## 2013-02-11 DIAGNOSIS — D649 Anemia, unspecified: Secondary | ICD-10-CM | POA: Diagnosis not present

## 2013-02-11 DIAGNOSIS — C8589 Other specified types of non-Hodgkin lymphoma, extranodal and solid organ sites: Secondary | ICD-10-CM | POA: Diagnosis not present

## 2013-02-11 LAB — CBC WITH DIFFERENTIAL/PLATELET
BASOS PCT: 0.3 % (ref 0.0–3.0)
Basophils Absolute: 0 10*3/uL (ref 0.0–0.1)
EOS ABS: 0.2 10*3/uL (ref 0.0–0.7)
Eosinophils Relative: 2.7 % (ref 0.0–5.0)
HCT: 34.3 % — ABNORMAL LOW (ref 36.0–46.0)
HEMOGLOBIN: 11.5 g/dL — AB (ref 12.0–15.0)
Lymphocytes Relative: 40.5 % (ref 12.0–46.0)
Lymphs Abs: 3.6 10*3/uL (ref 0.7–4.0)
MCHC: 33.5 g/dL (ref 30.0–36.0)
MCV: 81.2 fl (ref 78.0–100.0)
MONO ABS: 0.6 10*3/uL (ref 0.1–1.0)
Monocytes Relative: 6.8 % (ref 3.0–12.0)
NEUTROS ABS: 4.4 10*3/uL (ref 1.4–7.7)
NEUTROS PCT: 49.7 % (ref 43.0–77.0)
Platelets: 207 10*3/uL (ref 150.0–400.0)
RBC: 4.22 Mil/uL (ref 3.87–5.11)
RDW: 14.5 % (ref 11.5–14.6)
WBC: 8.9 10*3/uL (ref 4.5–10.5)

## 2013-02-11 LAB — COMPREHENSIVE METABOLIC PANEL
ALBUMIN: 3.8 g/dL (ref 3.5–5.2)
ALK PHOS: 77 U/L (ref 39–117)
ALT: 31 U/L (ref 0–35)
AST: 30 U/L (ref 0–37)
BUN: 11 mg/dL (ref 6–23)
CO2: 33 mEq/L — ABNORMAL HIGH (ref 19–32)
Calcium: 9.5 mg/dL (ref 8.4–10.5)
Chloride: 104 mEq/L (ref 96–112)
Creatinine, Ser: 0.8 mg/dL (ref 0.4–1.2)
GFR: 81.38 mL/min (ref >60.00)
Glucose, Bld: 93 mg/dL (ref 70–99)
POTASSIUM: 5 meq/L (ref 3.5–5.1)
SODIUM: 143 meq/L (ref 135–145)
Total Bilirubin: 0.5 mg/dL (ref 0.3–1.2)
Total Protein: 6.7 g/dL (ref 6.0–8.3)

## 2013-02-11 LAB — LIPID PANEL
Cholesterol: 235 mg/dL — ABNORMAL HIGH (ref 0–200)
HDL: 42.5 mg/dL (ref >39.00)
Total CHOL/HDL Ratio: 6
Triglycerides: 136 mg/dL (ref 0.0–149.0)
VLDL: 27.2 mg/dL (ref 0.0–40.0)

## 2013-02-11 LAB — FERRITIN: FERRITIN: 34.6 ng/mL (ref 10.0–291.0)

## 2013-02-11 LAB — LDL CHOLESTEROL, DIRECT: LDL DIRECT: 164.2 mg/dL

## 2013-02-11 LAB — TSH: TSH: 1.01 u[IU]/mL (ref 0.35–5.50)

## 2013-02-12 DIAGNOSIS — H04129 Dry eye syndrome of unspecified lacrimal gland: Secondary | ICD-10-CM | POA: Diagnosis not present

## 2013-02-18 ENCOUNTER — Ambulatory Visit (INDEPENDENT_AMBULATORY_CARE_PROVIDER_SITE_OTHER): Payer: Medicare Other | Admitting: Internal Medicine

## 2013-02-18 ENCOUNTER — Encounter: Payer: Self-pay | Admitting: Internal Medicine

## 2013-02-18 VITALS — BP 120/80 | HR 74 | Temp 98.5°F | Ht 68.0 in | Wt 185.2 lb

## 2013-02-18 DIAGNOSIS — M549 Dorsalgia, unspecified: Secondary | ICD-10-CM

## 2013-02-18 DIAGNOSIS — E7800 Pure hypercholesterolemia, unspecified: Secondary | ICD-10-CM

## 2013-02-18 DIAGNOSIS — D649 Anemia, unspecified: Secondary | ICD-10-CM | POA: Diagnosis not present

## 2013-02-18 DIAGNOSIS — G8929 Other chronic pain: Secondary | ICD-10-CM

## 2013-02-18 DIAGNOSIS — R319 Hematuria, unspecified: Secondary | ICD-10-CM | POA: Diagnosis not present

## 2013-02-18 DIAGNOSIS — C8589 Other specified types of non-Hodgkin lymphoma, extranodal and solid organ sites: Secondary | ICD-10-CM | POA: Diagnosis not present

## 2013-02-18 DIAGNOSIS — R300 Dysuria: Secondary | ICD-10-CM

## 2013-02-18 DIAGNOSIS — Z1211 Encounter for screening for malignant neoplasm of colon: Secondary | ICD-10-CM

## 2013-02-18 DIAGNOSIS — M797 Fibromyalgia: Secondary | ICD-10-CM

## 2013-02-18 DIAGNOSIS — K219 Gastro-esophageal reflux disease without esophagitis: Secondary | ICD-10-CM

## 2013-02-18 DIAGNOSIS — N200 Calculus of kidney: Secondary | ICD-10-CM

## 2013-02-18 DIAGNOSIS — R229 Localized swelling, mass and lump, unspecified: Secondary | ICD-10-CM

## 2013-02-18 DIAGNOSIS — C8590 Non-Hodgkin lymphoma, unspecified, unspecified site: Secondary | ICD-10-CM

## 2013-02-18 DIAGNOSIS — IMO0001 Reserved for inherently not codable concepts without codable children: Secondary | ICD-10-CM

## 2013-02-18 LAB — URINALYSIS, ROUTINE W REFLEX MICROSCOPIC
Bilirubin Urine: NEGATIVE
Hgb urine dipstick: NEGATIVE
KETONES UR: NEGATIVE
Nitrite: NEGATIVE
SPECIFIC GRAVITY, URINE: 1.015 (ref 1.000–1.030)
Total Protein, Urine: NEGATIVE
UROBILINOGEN UA: 0.2 (ref 0.0–1.0)
Urine Glucose: NEGATIVE
pH: 5.5 (ref 5.0–8.0)

## 2013-02-18 MED ORDER — FLUTICASONE PROPIONATE 50 MCG/ACT NA SUSP: 2.0000 | Freq: Every day | NASAL | Status: DC

## 2013-02-18 MED ORDER — OMEPRAZOLE 20 MG PO CPDR: 20.0000 mg | DELAYED_RELEASE_CAPSULE | Freq: Two times a day (BID) | ORAL | Status: DC

## 2013-02-18 MED ORDER — FUROSEMIDE 20 MG PO TABS: 20.0000 mg | ORAL_TABLET | ORAL | Status: DC | PRN

## 2013-02-18 NOTE — Progress Notes (Signed)
Pre-visit discussion using our clinic review tool. No additional management support is needed unless otherwise documented below in the visit note.  

## 2013-02-18 NOTE — Progress Notes (Signed)
Subjective:    Patient ID: Maria Keller, female    DOB: [birth year], 63 y.o.   MRN: [id number]  HPI 63 year old female with past history of fibromyalgia, GERD, depression and follicular lymphoma who comes in today to follow up on these issues as well as for a complete physical exam.  She states she is doing relatively well.  The previous pain and sensation change involving her head - resolved.  Doing well.  No significant sinus or allergy symptoms.   No chest congestion or cough.  No nausea or vomiting.  No abdominal pain.  Bowels stable.  She had a rectocele repair last year.  Did well after the surgery.  She does report that now she is starting to feel increased pressure - rectum and some discomfort in her vaginal area during intercourse.  No vaginal itching or burning.  No discharge.  She has noticed some burning with urination.  Some previous question of blood in her urine.  Felt she passed a stone.  No blood since passing the stone.  She is concerned about a nodule adjacent to her right breast.     Past Medical History  Diagnosis Date  . Follicular lymphoma     Followed by Dr Leretha Pol, s/p chemo and XRT  . Anemia     iron deficiency and B12 deficiency  . Hypercholesterolemia   . Shingles outbreak 11/26/2011  . IBS (irritable bowel syndrome)   . Chronic back pain     followed by Dr Primus Bravo  . Nephrolithiasis     followed by Dr Bernardo Heater, s/p stents  . Esophageal stricture     requiring dilatation x 2  . Hiatal hernia   . Depression   . Fibromyalgia   . GERD (gastroesophageal reflux disease)     Current Outpatient Prescriptions on File Prior to Visit  Medication Sig Dispense Refill  . albuterol (PROVENTIL HFA;VENTOLIN HFA) 108 (90 BASE) MCG/ACT inhaler Inhale 2 puffs into the lungs every 6 (six) hours as needed for wheezing.  1 Inhaler  0  . aspirin 81 MG tablet Take 81 mg by mouth daily.      . calcium carbonate (TUMS - DOSED IN MG ELEMENTAL CALCIUM) 500 MG chewable tablet Chew 1  tablet by mouth as needed.      Marland Kitchen escitalopram (LEXAPRO) 20 MG tablet Take 20 mg by mouth daily.      . fentaNYL (DURAGESIC - DOSED MCG/HR) 100 MCG/HR Place 1 patch onto the skin every other day.      . fentaNYL (DURAGESIC - DOSED MCG/HR) 25 MCG/HR Place 1 patch onto the skin every other day.      . fluticasone (FLONASE) 50 MCG/ACT nasal spray Place 2 sprays into the nose daily.  48 g  3  . furosemide (LASIX) 20 MG tablet Take 1 tablet (20 mg total) by mouth as needed.  90 tablet  1  . Omega-3 Fatty Acids (FISH OIL) 1360 MG CAPS Take 1 capsule by mouth daily.      Marland Kitchen omeprazole (PRILOSEC) 20 MG capsule Take 1 capsule (20 mg total) by mouth 2 (two) times daily.  180 capsule  3  . sennosides-docusate sodium (SENOKOT-S) 8.6-50 MG tablet Take 1 tablet by mouth as needed.       No current facility-administered medications on file prior to visit.    Review of Systems Patient denies any headache, lightheadedness or dizziness.  No sinus or allergy symptoms.   No chest pain, tightness or palpitations.  No  increased shortness of breath, cough or congestion.  No nausea or vomiting.  No abdominal pain or cramping.  No bowel change, such as diarrhea, constipation, BRBPR or melana.  She does report the pressure as outlined.  Some discomfort with intercourse.  Some burning with urination.  Previous blood.  Resolved after she ? Passed a stone.          Objective:   Physical Exam  Filed Vitals:   02/18/13 0847  BP: 120/80  Pulse: 74  Temp: 98.5 F (60.38 C)   63 year old female in no acute distress.   HEENT:  Nares- clear.  Oropharynx - without lesions. NECK:  Supple.  Nontender.  No audible bruit.  HEART:  Appears to be regular. LUNGS:  No crackles or wheezing audible.  Respirations even and unlabored.  RADIAL PULSE:  Equal bilaterally.    BREASTS:  No nipple discharge or nipple retraction present.  Could not appreciate any distinct nodules or axillary adenopathy.  CHEST WALL:  Small superficial  palpable nodule.  Non tender.   ABDOMEN:  Soft, nontender.  Bowel sounds present and normal.  No audible abdominal bruit.  GU:  Normal external genitalia.  Vaginal vault without lesions.  S/p hysterectomy.  Could not appreciate any adnexal masses or tenderness.   RECTAL:  Heme negative.   EXTREMITIES:  No increased edema present.  DP pulses palpable and equal bilaterally.          Assessment & Plan:  RECTOCELE.  Exam as outlined.  Is s/p previous rectocele surgery.  Symptoms as outlined.  Recommended f/u with gyn for evaluation.      PSYCH.  Continues to follow up with Dr Bridgett Larsson.  Stable.   CARDIOVASCULAR.  ECHO 02/23/08 - normal LV function with mild right ventricle enlargement.  Saw Dr Saralyn Pilar.  No evidence of ischemia on ETT myoview.  Currently asymptomatic.  Follow.   HEALTH MAINTENANCE.  Physical today.  S/P hysterectomy.  Does not need yearly pap smears.  Mammograms done through [facility].  Saw GI and they felt no further w/up warranted.  IFOB given.

## 2013-02-19 LAB — CULTURE, URINE COMPREHENSIVE
Colony Count: NO GROWTH
ORGANISM ID, BACTERIA: NO GROWTH

## 2013-02-22 ENCOUNTER — Encounter: Payer: Self-pay | Admitting: Internal Medicine

## 2013-02-22 ENCOUNTER — Telehealth: Payer: Self-pay | Admitting: Internal Medicine

## 2013-02-22 DIAGNOSIS — R229 Localized swelling, mass and lump, unspecified: Secondary | ICD-10-CM | POA: Insufficient documentation

## 2013-02-22 NOTE — Assessment & Plan Note (Signed)
Exam as outlined.  Non tender.  Will have Dr Leretha Pol evaluate to see if any further w/up warranted.

## 2013-02-22 NOTE — Assessment & Plan Note (Signed)
Being followed at the pain clinic - Dr Crisp.    

## 2013-02-22 NOTE — Assessment & Plan Note (Signed)
On omeprazole.  Symptoms controlled.  Follow.   

## 2013-02-22 NOTE — Telephone Encounter (Signed)
I only received one note from [facility] - past records.  I need records from last several years to see if I can determine which cholesterol  Medication she was on previously.

## 2013-02-22 NOTE — Assessment & Plan Note (Signed)
Stable

## 2013-02-22 NOTE — Assessment & Plan Note (Signed)
Check urinalysis and culture 

## 2013-02-22 NOTE — Assessment & Plan Note (Signed)
Low cholesterol diet and exercise.  Had problems with statin medication previously. Will see if can find out which medication she tried previously.   Follow lipid panel.

## 2013-02-22 NOTE — Addendum Note (Signed)
Addended by: Alisa Graff on: 02/22/2013 12:37 PM   Modules accepted: Orders

## 2013-02-22 NOTE — Assessment & Plan Note (Signed)
Being followed by Dr DeCastro.  Has been stable.   

## 2013-02-22 NOTE — Assessment & Plan Note (Signed)
Previous urosepsis.  Continue to follow up with Dr Bernardo Heater.  Currently asymptomatic.  May have recently passed a stone.  Symptoms improved now.  Check urinalysis and culture.

## 2013-02-22 NOTE — Assessment & Plan Note (Addendum)
Was being followed at the cancer center.  Follow cbc and ferritin.  Last hgb slightly decreased.  Unable to tolerate oral iron.  Follow.   IFOB given.

## 2013-02-25 NOTE — Telephone Encounter (Signed)
Records requested

## 2013-02-27 ENCOUNTER — Other Ambulatory Visit (INDEPENDENT_AMBULATORY_CARE_PROVIDER_SITE_OTHER): Payer: Medicare Other

## 2013-02-27 DIAGNOSIS — Z1211 Encounter for screening for malignant neoplasm of colon: Secondary | ICD-10-CM | POA: Diagnosis not present

## 2013-02-27 LAB — FECAL OCCULT BLOOD, IMMUNOCHEMICAL: FECAL OCCULT BLD: POSITIVE — AB

## 2013-03-02 ENCOUNTER — Other Ambulatory Visit: Payer: Self-pay | Admitting: Internal Medicine

## 2013-03-02 DIAGNOSIS — D509 Iron deficiency anemia, unspecified: Secondary | ICD-10-CM

## 2013-03-02 DIAGNOSIS — R195 Other fecal abnormalities: Secondary | ICD-10-CM

## 2013-03-02 NOTE — Progress Notes (Signed)
Order placed for GI referral.   

## 2013-03-04 DIAGNOSIS — C8589 Other specified types of non-Hodgkin lymphoma, extranodal and solid organ sites: Secondary | ICD-10-CM | POA: Diagnosis not present

## 2013-03-10 ENCOUNTER — Ambulatory Visit: Payer: Self-pay | Admitting: Pain Medicine

## 2013-03-10 DIAGNOSIS — M546 Pain in thoracic spine: Secondary | ICD-10-CM | POA: Diagnosis not present

## 2013-03-10 DIAGNOSIS — IMO0002 Reserved for concepts with insufficient information to code with codable children: Secondary | ICD-10-CM | POA: Diagnosis not present

## 2013-03-10 DIAGNOSIS — Z87898 Personal history of other specified conditions: Secondary | ICD-10-CM | POA: Diagnosis not present

## 2013-03-10 DIAGNOSIS — M79609 Pain in unspecified limb: Secondary | ICD-10-CM | POA: Diagnosis not present

## 2013-03-10 DIAGNOSIS — M76899 Other specified enthesopathies of unspecified lower limb, excluding foot: Secondary | ICD-10-CM | POA: Diagnosis not present

## 2013-03-10 DIAGNOSIS — M47817 Spondylosis without myelopathy or radiculopathy, lumbosacral region: Secondary | ICD-10-CM | POA: Diagnosis not present

## 2013-03-11 ENCOUNTER — Other Ambulatory Visit (INDEPENDENT_AMBULATORY_CARE_PROVIDER_SITE_OTHER): Payer: Medicare Other

## 2013-03-11 DIAGNOSIS — D649 Anemia, unspecified: Secondary | ICD-10-CM | POA: Diagnosis not present

## 2013-03-11 LAB — CBC WITH DIFFERENTIAL/PLATELET
BASOS ABS: 0 10*3/uL (ref 0.0–0.1)
Basophils Relative: 0.4 % (ref 0.0–3.0)
EOS ABS: 0.2 10*3/uL (ref 0.0–0.7)
Eosinophils Relative: 1.9 % (ref 0.0–5.0)
HCT: 38.5 % (ref 36.0–46.0)
Hemoglobin: 12.5 g/dL (ref 12.0–15.0)
LYMPHS PCT: 42.9 % (ref 12.0–46.0)
Lymphs Abs: 4 10*3/uL (ref 0.7–4.0)
MCHC: 32.5 g/dL (ref 30.0–36.0)
MCV: 83.5 fl (ref 78.0–100.0)
MONOS PCT: 9.8 % (ref 3.0–12.0)
Monocytes Absolute: 0.9 10*3/uL (ref 0.1–1.0)
NEUTROS ABS: 4.2 10*3/uL (ref 1.4–7.7)
Neutrophils Relative %: 45 % (ref 43.0–77.0)
PLATELETS: 226 10*3/uL (ref 150.0–400.0)
RBC: 4.61 Mil/uL (ref 3.87–5.11)
RDW: 15 % — AB (ref 11.5–14.6)
WBC: 9.3 10*3/uL (ref 4.5–10.5)

## 2013-03-11 LAB — IBC PANEL
IRON: 59 ug/dL (ref 42–145)
Saturation Ratios: 14.5 % — ABNORMAL LOW (ref 20.0–50.0)
TRANSFERRIN: 291 mg/dL (ref 212.0–360.0)

## 2013-03-11 LAB — FERRITIN: FERRITIN: 36.5 ng/mL (ref 10.0–291.0)

## 2013-03-12 ENCOUNTER — Encounter: Payer: Self-pay | Admitting: *Deleted

## 2013-03-16 DIAGNOSIS — R1031 Right lower quadrant pain: Secondary | ICD-10-CM | POA: Diagnosis not present

## 2013-03-16 DIAGNOSIS — R195 Other fecal abnormalities: Secondary | ICD-10-CM | POA: Diagnosis not present

## 2013-03-17 ENCOUNTER — Ambulatory Visit: Payer: Self-pay | Admitting: Unknown Physician Specialty

## 2013-03-17 DIAGNOSIS — R1031 Right lower quadrant pain: Secondary | ICD-10-CM | POA: Diagnosis not present

## 2013-03-19 DIAGNOSIS — F329 Major depressive disorder, single episode, unspecified: Secondary | ICD-10-CM | POA: Diagnosis not present

## 2013-03-19 DIAGNOSIS — F4542 Pain disorder with related psychological factors: Secondary | ICD-10-CM | POA: Diagnosis not present

## 2013-03-19 DIAGNOSIS — F3289 Other specified depressive episodes: Secondary | ICD-10-CM | POA: Diagnosis not present

## 2013-03-26 DIAGNOSIS — N952 Postmenopausal atrophic vaginitis: Secondary | ICD-10-CM | POA: Diagnosis not present

## 2013-03-26 DIAGNOSIS — IMO0002 Reserved for concepts with insufficient information to code with codable children: Secondary | ICD-10-CM | POA: Diagnosis not present

## 2013-03-26 DIAGNOSIS — N815 Vaginal enterocele: Secondary | ICD-10-CM | POA: Diagnosis not present

## 2013-04-06 DIAGNOSIS — M6580 Other synovitis and tenosynovitis, unspecified site: Secondary | ICD-10-CM | POA: Diagnosis not present

## 2013-04-09 ENCOUNTER — Ambulatory Visit: Payer: Self-pay | Admitting: Pain Medicine

## 2013-04-09 DIAGNOSIS — M47817 Spondylosis without myelopathy or radiculopathy, lumbosacral region: Secondary | ICD-10-CM | POA: Diagnosis not present

## 2013-04-09 DIAGNOSIS — B0229 Other postherpetic nervous system involvement: Secondary | ICD-10-CM | POA: Diagnosis not present

## 2013-04-09 DIAGNOSIS — M76899 Other specified enthesopathies of unspecified lower limb, excluding foot: Secondary | ICD-10-CM | POA: Diagnosis not present

## 2013-04-09 DIAGNOSIS — M5137 Other intervertebral disc degeneration, lumbosacral region: Secondary | ICD-10-CM | POA: Diagnosis not present

## 2013-04-09 DIAGNOSIS — IMO0002 Reserved for concepts with insufficient information to code with codable children: Secondary | ICD-10-CM | POA: Diagnosis not present

## 2013-04-09 DIAGNOSIS — Z79899 Other long term (current) drug therapy: Secondary | ICD-10-CM | POA: Diagnosis not present

## 2013-04-09 DIAGNOSIS — M461 Sacroiliitis, not elsewhere classified: Secondary | ICD-10-CM | POA: Diagnosis not present

## 2013-04-09 DIAGNOSIS — M546 Pain in thoracic spine: Secondary | ICD-10-CM | POA: Diagnosis not present

## 2013-04-17 ENCOUNTER — Ambulatory Visit: Payer: Self-pay | Admitting: Unknown Physician Specialty

## 2013-04-17 DIAGNOSIS — S83289A Other tear of lateral meniscus, current injury, unspecified knee, initial encounter: Secondary | ICD-10-CM | POA: Diagnosis not present

## 2013-04-17 DIAGNOSIS — M23329 Other meniscus derangements, posterior horn of medial meniscus, unspecified knee: Secondary | ICD-10-CM | POA: Diagnosis not present

## 2013-04-17 DIAGNOSIS — M899 Disorder of bone, unspecified: Secondary | ICD-10-CM | POA: Diagnosis not present

## 2013-04-17 DIAGNOSIS — IMO0002 Reserved for concepts with insufficient information to code with codable children: Secondary | ICD-10-CM | POA: Diagnosis not present

## 2013-04-17 DIAGNOSIS — M25569 Pain in unspecified knee: Secondary | ICD-10-CM | POA: Diagnosis not present

## 2013-04-28 ENCOUNTER — Telehealth: Payer: Self-pay | Admitting: *Deleted

## 2013-04-28 NOTE — Telephone Encounter (Signed)
Pt called to check the status of the medical clearance for right knee surgery. She states the Dr. Jefm Bryant has sent a fax over for clearance. Please advise

## 2013-04-29 NOTE — Telephone Encounter (Signed)
Just received fax for clearance.  She will need to be scheduled for a pre op evaluation.  Can come in on 05/07/13 at 12:00 for pre op evaluation.

## 2013-04-29 NOTE — Telephone Encounter (Signed)
Please put pt on Dr. Nicki Reaper schedule (see below) & pt already aware of appt.

## 2013-04-29 NOTE — Telephone Encounter (Signed)
Pt notified. appt scheduled.

## 2013-05-05 ENCOUNTER — Ambulatory Visit: Payer: Self-pay | Admitting: Pain Medicine

## 2013-05-05 DIAGNOSIS — IMO0002 Reserved for concepts with insufficient information to code with codable children: Secondary | ICD-10-CM | POA: Diagnosis not present

## 2013-05-05 DIAGNOSIS — Z79899 Other long term (current) drug therapy: Secondary | ICD-10-CM | POA: Diagnosis not present

## 2013-05-05 DIAGNOSIS — M76899 Other specified enthesopathies of unspecified lower limb, excluding foot: Secondary | ICD-10-CM | POA: Diagnosis not present

## 2013-05-05 DIAGNOSIS — M461 Sacroiliitis, not elsewhere classified: Secondary | ICD-10-CM | POA: Diagnosis not present

## 2013-05-05 DIAGNOSIS — M546 Pain in thoracic spine: Secondary | ICD-10-CM | POA: Diagnosis not present

## 2013-05-05 DIAGNOSIS — M5137 Other intervertebral disc degeneration, lumbosacral region: Secondary | ICD-10-CM | POA: Diagnosis not present

## 2013-05-05 DIAGNOSIS — M47817 Spondylosis without myelopathy or radiculopathy, lumbosacral region: Secondary | ICD-10-CM | POA: Diagnosis not present

## 2013-05-05 DIAGNOSIS — B0229 Other postherpetic nervous system involvement: Secondary | ICD-10-CM | POA: Diagnosis not present

## 2013-05-05 DIAGNOSIS — Z87898 Personal history of other specified conditions: Secondary | ICD-10-CM | POA: Diagnosis not present

## 2013-05-07 ENCOUNTER — Encounter: Payer: Self-pay | Admitting: Internal Medicine

## 2013-05-07 ENCOUNTER — Ambulatory Visit (INDEPENDENT_AMBULATORY_CARE_PROVIDER_SITE_OTHER): Payer: Medicare Other | Admitting: Internal Medicine

## 2013-05-07 VITALS — BP 100/64 | HR 85 | Temp 98.5°F | Resp 16 | Ht 67.0 in | Wt 184.2 lb

## 2013-05-07 DIAGNOSIS — C8589 Other specified types of non-Hodgkin lymphoma, extranodal and solid organ sites: Secondary | ICD-10-CM | POA: Diagnosis not present

## 2013-05-07 DIAGNOSIS — K219 Gastro-esophageal reflux disease without esophagitis: Secondary | ICD-10-CM | POA: Diagnosis not present

## 2013-05-07 DIAGNOSIS — Z01818 Encounter for other preprocedural examination: Secondary | ICD-10-CM | POA: Diagnosis not present

## 2013-05-07 DIAGNOSIS — D649 Anemia, unspecified: Secondary | ICD-10-CM

## 2013-05-07 DIAGNOSIS — C8590 Non-Hodgkin lymphoma, unspecified, unspecified site: Secondary | ICD-10-CM

## 2013-05-07 NOTE — Progress Notes (Signed)
Pre visit review using our clinic review tool, if applicable. No additional management support is needed unless otherwise documented below in the visit note. 

## 2013-05-10 ENCOUNTER — Encounter: Payer: Self-pay | Admitting: Internal Medicine

## 2013-05-10 DIAGNOSIS — M25569 Pain in unspecified knee: Secondary | ICD-10-CM | POA: Insufficient documentation

## 2013-05-10 NOTE — Assessment & Plan Note (Signed)
On omeprazole.  Symptoms controlled.  Follow.   

## 2013-05-10 NOTE — Progress Notes (Signed)
Subjective:    Patient ID: Maria Keller, female    DOB: Aug 19, 1950, 63 y.o.   MRN: [id number]  HPI 63 year old female with past history of fibromyalgia, GERD, depression and follicular lymphoma who comes in today for a pre op evaluation.   She states she is doing relatively well.  Her main complaint is that of right knee pain.  Pain localized to the popliteal region and then extends down her right leg.  Planning for right knee arthroscopy.  She reports otherwise doing well.  No chest pain or tightness.  No sob.  No fever. Eating and drinking well.  No nausea or vomiting.  Bowels stable.  No urinary symptoms.      Past Medical History  Diagnosis Date  . Follicular lymphoma     Followed by Dr Leretha Pol, s/p chemo and XRT  . Anemia     iron deficiency and B12 deficiency  . Hypercholesterolemia   . Shingles outbreak 11/26/2011  . IBS (irritable bowel syndrome)   . Chronic back pain     followed by Dr Primus Bravo  . Nephrolithiasis     followed by Dr Bernardo Heater, s/p stents  . Esophageal stricture     requiring dilatation x 2  . Hiatal hernia   . Depression   . Fibromyalgia   . GERD (gastroesophageal reflux disease)     Current Outpatient Prescriptions on File Prior to Visit  Medication Sig Dispense Refill  . albuterol (PROVENTIL HFA;VENTOLIN HFA) 108 (90 BASE) MCG/ACT inhaler Inhale 2 puffs into the lungs every 6 (six) hours as needed for wheezing.  1 Inhaler  0  . aspirin 81 MG tablet Take 81 mg by mouth daily.      . calcium carbonate (TUMS - DOSED IN MG ELEMENTAL CALCIUM) 500 MG chewable tablet Chew 1 tablet by mouth as needed.      . conjugated estrogens (PREMARIN) vaginal cream Place 1 Applicatorful vaginally daily.      . cyclobenzaprine (FLEXERIL) 5 MG tablet Take 5 mg by mouth 2 (two) times daily as needed for muscle spasms.      Marland Kitchen escitalopram (LEXAPRO) 20 MG tablet Take 20 mg by mouth daily.      . fentaNYL (DURAGESIC - DOSED MCG/HR) 100 MCG/HR Place 1 patch onto the skin every other  day.      . fentaNYL (DURAGESIC - DOSED MCG/HR) 25 MCG/HR Place 1 patch onto the skin every other day.      . fluticasone (FLONASE) 50 MCG/ACT nasal spray Place 2 sprays into both nostrils daily.  48 g  3  . furosemide (LASIX) 20 MG tablet Take 1 tablet (20 mg total) by mouth as needed.  90 tablet  1  . Omega-3 Fatty Acids (FISH OIL) 1360 MG CAPS Take 1 capsule by mouth daily.      Marland Kitchen omeprazole (PRILOSEC) 20 MG capsule Take 1 capsule (20 mg total) by mouth 2 (two) times daily.  180 capsule  3  . sennosides-docusate sodium (SENOKOT-S) 8.6-50 MG tablet Take 1 tablet by mouth as needed.       No current facility-administered medications on file prior to visit.    Review of Systems Patient denies any headache, lightheadedness or dizziness.  No sinus or allergy symptoms.   No chest pain, tightness or palpitations.  No increased shortness of breath, cough or congestion.  No nausea or vomiting.  No abdominal pain or cramping.  No bowel change, such as diarrhea, constipation, BRBPR or melana.  No urinary  symptoms.  Overall she feels she is doing well.          Objective:   Physical Exam  Filed Vitals:   05/07/13 1159  BP: 100/64  Pulse: 85  Temp: 98.5 F (36.9 C)  Resp: 87   63 year old female in no acute distress.   HEENT:  Nares- clear.  Oropharynx - without lesions. NECK:  Supple.  Nontender.  No audible bruit.  HEART:  Appears to be regular. LUNGS:  No crackles or wheezing audible.  Respirations even and unlabored.  RADIAL PULSE:  Equal bilaterally.  ABDOMEN:  Soft, nontender.  Bowel sounds present and normal.  No audible abdominal bruit.    EXTREMITIES:  No increased edema present.  DP pulses palpable and equal bilaterally.          Assessment & Plan:  HEALTH MAINTENANCE.  Physical 02/18/13.  S/P hysterectomy.  Does not need yearly pap smears.  Mammograms done through [facility].  Colonoscopy 03/10/13 - melanosis and internal hemorrhoids.

## 2013-05-10 NOTE — Assessment & Plan Note (Signed)
Being followed by Dr DeCastro.  Has been stable.   

## 2013-05-10 NOTE — Assessment & Plan Note (Signed)
Was being followed at the cancer center.   Unable to tolerate oral iron.  Hgb 03/11/13 - 12.5.  Will need a pre op cbc.  Monitory hgb in the peri op period.

## 2013-05-10 NOTE — Assessment & Plan Note (Signed)
Planning for right knee arthroscopy.  Here for pre op evaluation.  EKG obtained and revealed SR with no acute ischemic changes.  Given asymptomatic and given EKG findings, I feel that she is at a low cardiac risk to proceed with the planned surgery.  Will need close intra op and post op monitoring of her heart rate and blood pressure to avoid extremes.  As above, I also recommend checking a pre op cbc and recommend peri op monitoring of her hemoglobin.

## 2013-05-12 ENCOUNTER — Telehealth: Payer: Self-pay | Admitting: Internal Medicine

## 2013-05-12 NOTE — Telephone Encounter (Signed)
Needing medical clearance for pt.  States she left vm yesterday.

## 2013-05-12 NOTE — Telephone Encounter (Signed)
I have faxed the clearance 3 times now. 4/2,4/6& 4/7. I have called Freda Munro back at Dr. Scharlene Gloss office. She gave me a "[phone]" number, however I just found out that it has to be faxed without the area code.

## 2013-05-22 ENCOUNTER — Ambulatory Visit: Payer: Self-pay | Admitting: Unknown Physician Specialty

## 2013-05-22 DIAGNOSIS — E538 Deficiency of other specified B group vitamins: Secondary | ICD-10-CM | POA: Diagnosis not present

## 2013-05-22 DIAGNOSIS — M23359 Other meniscus derangements, posterior horn of lateral meniscus, unspecified knee: Secondary | ICD-10-CM | POA: Diagnosis not present

## 2013-05-22 DIAGNOSIS — Z87442 Personal history of urinary calculi: Secondary | ICD-10-CM | POA: Diagnosis not present

## 2013-05-22 DIAGNOSIS — F3289 Other specified depressive episodes: Secondary | ICD-10-CM | POA: Diagnosis not present

## 2013-05-22 DIAGNOSIS — Z79890 Hormone replacement therapy (postmenopausal): Secondary | ICD-10-CM | POA: Diagnosis not present

## 2013-05-22 DIAGNOSIS — Z79899 Other long term (current) drug therapy: Secondary | ICD-10-CM | POA: Diagnosis not present

## 2013-05-22 DIAGNOSIS — D649 Anemia, unspecified: Secondary | ICD-10-CM | POA: Diagnosis not present

## 2013-05-22 DIAGNOSIS — K449 Diaphragmatic hernia without obstruction or gangrene: Secondary | ICD-10-CM | POA: Diagnosis not present

## 2013-05-22 DIAGNOSIS — M9420 Chondromalacia, unspecified site: Secondary | ICD-10-CM | POA: Diagnosis not present

## 2013-05-22 DIAGNOSIS — IMO0001 Reserved for inherently not codable concepts without codable children: Secondary | ICD-10-CM | POA: Diagnosis not present

## 2013-05-22 DIAGNOSIS — M23305 Other meniscus derangements, unspecified medial meniscus, unspecified knee: Secondary | ICD-10-CM | POA: Diagnosis not present

## 2013-05-22 DIAGNOSIS — J309 Allergic rhinitis, unspecified: Secondary | ICD-10-CM | POA: Diagnosis not present

## 2013-05-22 DIAGNOSIS — F329 Major depressive disorder, single episode, unspecified: Secondary | ICD-10-CM | POA: Diagnosis not present

## 2013-05-22 DIAGNOSIS — M23349 Other meniscus derangements, anterior horn of lateral meniscus, unspecified knee: Secondary | ICD-10-CM | POA: Diagnosis not present

## 2013-05-22 DIAGNOSIS — E7800 Pure hypercholesterolemia, unspecified: Secondary | ICD-10-CM | POA: Diagnosis not present

## 2013-05-22 DIAGNOSIS — M23329 Other meniscus derangements, posterior horn of medial meniscus, unspecified knee: Secondary | ICD-10-CM | POA: Diagnosis not present

## 2013-05-22 DIAGNOSIS — Z7982 Long term (current) use of aspirin: Secondary | ICD-10-CM | POA: Diagnosis not present

## 2013-05-22 DIAGNOSIS — M1710 Unilateral primary osteoarthritis, unspecified knee: Secondary | ICD-10-CM | POA: Diagnosis not present

## 2013-06-02 ENCOUNTER — Other Ambulatory Visit: Payer: Self-pay | Admitting: Internal Medicine

## 2013-06-04 ENCOUNTER — Ambulatory Visit: Payer: Self-pay | Admitting: Pain Medicine

## 2013-06-04 DIAGNOSIS — Z9889 Other specified postprocedural states: Secondary | ICD-10-CM | POA: Diagnosis not present

## 2013-06-04 DIAGNOSIS — Z79899 Other long term (current) drug therapy: Secondary | ICD-10-CM | POA: Diagnosis not present

## 2013-06-04 DIAGNOSIS — M76899 Other specified enthesopathies of unspecified lower limb, excluding foot: Secondary | ICD-10-CM | POA: Diagnosis not present

## 2013-06-04 DIAGNOSIS — M25669 Stiffness of unspecified knee, not elsewhere classified: Secondary | ICD-10-CM | POA: Diagnosis not present

## 2013-06-04 DIAGNOSIS — IMO0002 Reserved for concepts with insufficient information to code with codable children: Secondary | ICD-10-CM | POA: Diagnosis not present

## 2013-06-04 DIAGNOSIS — M25569 Pain in unspecified knee: Secondary | ICD-10-CM | POA: Diagnosis not present

## 2013-06-04 DIAGNOSIS — M546 Pain in thoracic spine: Secondary | ICD-10-CM | POA: Diagnosis not present

## 2013-06-04 DIAGNOSIS — M5137 Other intervertebral disc degeneration, lumbosacral region: Secondary | ICD-10-CM | POA: Diagnosis not present

## 2013-06-04 DIAGNOSIS — M47817 Spondylosis without myelopathy or radiculopathy, lumbosacral region: Secondary | ICD-10-CM | POA: Diagnosis not present

## 2013-06-04 DIAGNOSIS — M6281 Muscle weakness (generalized): Secondary | ICD-10-CM | POA: Diagnosis not present

## 2013-06-04 DIAGNOSIS — M461 Sacroiliitis, not elsewhere classified: Secondary | ICD-10-CM | POA: Diagnosis not present

## 2013-06-15 DIAGNOSIS — M25569 Pain in unspecified knee: Secondary | ICD-10-CM | POA: Diagnosis not present

## 2013-06-16 DIAGNOSIS — IMO0002 Reserved for concepts with insufficient information to code with codable children: Secondary | ICD-10-CM | POA: Diagnosis not present

## 2013-06-16 DIAGNOSIS — N815 Vaginal enterocele: Secondary | ICD-10-CM | POA: Diagnosis not present

## 2013-06-18 ENCOUNTER — Ambulatory Visit (INDEPENDENT_AMBULATORY_CARE_PROVIDER_SITE_OTHER): Payer: Medicare Other | Admitting: Internal Medicine

## 2013-06-18 ENCOUNTER — Encounter: Payer: Self-pay | Admitting: Internal Medicine

## 2013-06-18 VITALS — BP 110/70 | HR 73 | Temp 98.6°F | Ht 67.0 in | Wt 184.8 lb

## 2013-06-18 DIAGNOSIS — M549 Dorsalgia, unspecified: Secondary | ICD-10-CM

## 2013-06-18 DIAGNOSIS — N200 Calculus of kidney: Secondary | ICD-10-CM | POA: Diagnosis not present

## 2013-06-18 DIAGNOSIS — Z1239 Encounter for other screening for malignant neoplasm of breast: Secondary | ICD-10-CM

## 2013-06-18 DIAGNOSIS — R21 Rash and other nonspecific skin eruption: Secondary | ICD-10-CM

## 2013-06-18 DIAGNOSIS — IMO0001 Reserved for inherently not codable concepts without codable children: Secondary | ICD-10-CM

## 2013-06-18 DIAGNOSIS — K219 Gastro-esophageal reflux disease without esophagitis: Secondary | ICD-10-CM

## 2013-06-18 DIAGNOSIS — M25569 Pain in unspecified knee: Secondary | ICD-10-CM

## 2013-06-18 DIAGNOSIS — G8929 Other chronic pain: Secondary | ICD-10-CM

## 2013-06-18 DIAGNOSIS — C8590 Non-Hodgkin lymphoma, unspecified, unspecified site: Secondary | ICD-10-CM

## 2013-06-18 DIAGNOSIS — D649 Anemia, unspecified: Secondary | ICD-10-CM | POA: Diagnosis not present

## 2013-06-18 DIAGNOSIS — M797 Fibromyalgia: Secondary | ICD-10-CM

## 2013-06-18 DIAGNOSIS — E7800 Pure hypercholesterolemia, unspecified: Secondary | ICD-10-CM | POA: Diagnosis not present

## 2013-06-18 DIAGNOSIS — C8589 Other specified types of non-Hodgkin lymphoma, extranodal and solid organ sites: Secondary | ICD-10-CM

## 2013-06-18 LAB — COMPREHENSIVE METABOLIC PANEL
ALK PHOS: 75 U/L (ref 39–117)
ALT: 27 U/L (ref 0–35)
AST: 29 U/L (ref 0–37)
Albumin: 4.2 g/dL (ref 3.5–5.2)
BUN: 11 mg/dL (ref 6–23)
CO2: 36 mEq/L — ABNORMAL HIGH (ref 19–32)
Calcium: 9.7 mg/dL (ref 8.4–10.5)
Chloride: 98 mEq/L (ref 96–112)
Creatinine, Ser: 0.7 mg/dL (ref 0.4–1.2)
GFR: 87.93 mL/min (ref >60.00)
Glucose, Bld: 82 mg/dL (ref 70–99)
POTASSIUM: 4.1 meq/L (ref 3.5–5.1)
Sodium: 141 mEq/L (ref 135–145)
Total Bilirubin: 0.7 mg/dL (ref 0.2–1.2)
Total Protein: 6.9 g/dL (ref 6.0–8.3)

## 2013-06-18 LAB — CBC WITH DIFFERENTIAL/PLATELET
BASOS PCT: 0.5 % (ref 0.0–3.0)
Basophils Absolute: 0 10*3/uL (ref 0.0–0.1)
EOS PCT: 1.9 % (ref 0.0–5.0)
Eosinophils Absolute: 0.2 10*3/uL (ref 0.0–0.7)
HEMATOCRIT: 37.4 % (ref 36.0–46.0)
Hemoglobin: 12.3 g/dL (ref 12.0–15.0)
LYMPHS ABS: 3.7 10*3/uL (ref 0.7–4.0)
Lymphocytes Relative: 42.6 % (ref 12.0–46.0)
MCHC: 32.8 g/dL (ref 30.0–36.0)
MCV: 83.3 fl (ref 78.0–100.0)
MONO ABS: 0.7 10*3/uL (ref 0.1–1.0)
Monocytes Relative: 7.5 % (ref 3.0–12.0)
NEUTROS PCT: 47.5 % (ref 43.0–77.0)
Neutro Abs: 4.2 10*3/uL (ref 1.4–7.7)
Platelets: 244 10*3/uL (ref 150.0–400.0)
RBC: 4.49 Mil/uL (ref 3.87–5.11)
RDW: 15.6 % — ABNORMAL HIGH (ref 11.5–15.5)
WBC: 8.8 10*3/uL (ref 4.0–10.5)

## 2013-06-18 LAB — LIPID PANEL
CHOLESTEROL: 216 mg/dL — AB (ref 0–200)
HDL: 46.8 mg/dL (ref >39.00)
LDL CALC: 133 mg/dL — AB (ref 0–99)
TRIGLYCERIDES: 180 mg/dL — AB (ref 0.0–149.0)
Total CHOL/HDL Ratio: 5
VLDL: 36 mg/dL (ref 0.0–40.0)

## 2013-06-18 LAB — FERRITIN: Ferritin: 26.6 ng/mL (ref 10.0–291.0)

## 2013-06-18 MED ORDER — FUROSEMIDE 20 MG PO TABS: 20.0000 mg | ORAL_TABLET | ORAL | Status: DC | PRN

## 2013-06-18 MED ORDER — VALACYCLOVIR HCL 1 G PO TABS: 1000.0000 mg | ORAL_TABLET | Freq: Three times a day (TID) | ORAL | Status: DC

## 2013-06-18 NOTE — Progress Notes (Signed)
Pre visit review using our clinic review tool, if applicable. No additional management support is needed unless otherwise documented below in the visit note. 

## 2013-06-19 ENCOUNTER — Encounter: Payer: Self-pay | Admitting: *Deleted

## 2013-06-19 DIAGNOSIS — M25569 Pain in unspecified knee: Secondary | ICD-10-CM | POA: Diagnosis not present

## 2013-06-28 ENCOUNTER — Encounter: Payer: Self-pay | Admitting: Internal Medicine

## 2013-06-28 NOTE — Progress Notes (Signed)
Subjective:    Patient ID: Maria Keller, female    DOB: 1951/01/25, 63 y.o.   MRN: [id number]  HPI 64 year old female with past history of fibromyalgia, GERD, depression and follicular lymphoma who comes in today for a scheduled follow up.  She states she is doing better.  Is s/p  right knee arthroscopy.  Doing well.  Knee better.  Going to physical therapy and doing her exercises at home.  No chest pain or tightness.  No sob.  No fever. Eating and drinking well.  No nausea or vomiting.  Bowels stable.  No urinary symptoms.   Did recently f/u with Dr Enzo Bi for her enterocele.  Stable.     Past Medical History  Diagnosis Date  . Follicular lymphoma     Followed by Dr Leretha Pol, s/p chemo and XRT  . Anemia     iron deficiency and B12 deficiency  . Hypercholesterolemia   . Shingles outbreak 11/26/2011  . IBS (irritable bowel syndrome)   . Chronic back pain     followed by Dr Primus Bravo  . Nephrolithiasis     followed by Dr Bernardo Heater, s/p stents  . Esophageal stricture     requiring dilatation x 2  . Hiatal hernia   . Depression   . Fibromyalgia   . GERD (gastroesophageal reflux disease)     Current Outpatient Prescriptions on File Prior to Visit  Medication Sig Dispense Refill  . albuterol (PROVENTIL HFA;VENTOLIN HFA) 108 (90 BASE) MCG/ACT inhaler Inhale 2 puffs into the lungs every 6 (six) hours as needed for wheezing.  1 Inhaler  0  . aspirin 81 MG tablet Take 81 mg by mouth daily.      . calcium carbonate (TUMS - DOSED IN MG ELEMENTAL CALCIUM) 500 MG chewable tablet Chew 1 tablet by mouth as needed.      . conjugated estrogens (PREMARIN) vaginal cream Place 1 Applicatorful vaginally daily.      . cyclobenzaprine (FLEXERIL) 5 MG tablet Take 5 mg by mouth 2 (two) times daily as needed for muscle spasms.      Marland Kitchen escitalopram (LEXAPRO) 20 MG tablet Take 20 mg by mouth daily.      . fentaNYL (DURAGESIC - DOSED MCG/HR) 100 MCG/HR Place 1 patch onto the skin every other day.      .  fentaNYL (DURAGESIC - DOSED MCG/HR) 25 MCG/HR Place 1 patch onto the skin every other day.      . fluticasone (FLONASE) 50 MCG/ACT nasal spray Place 2 sprays into both nostrils daily.  48 g  3  . Omega-3 Fatty Acids (FISH OIL) 1360 MG CAPS Take 1 capsule by mouth daily.      Marland Kitchen omeprazole (PRILOSEC) 20 MG capsule Take 1 capsule (20 mg total) by mouth 2 (two) times daily.  180 capsule  3  . sennosides-docusate sodium (SENOKOT-S) 8.6-50 MG tablet Take 1 tablet by mouth as needed.       No current facility-administered medications on file prior to visit.    Review of Systems Patient denies any headache, lightheadedness or dizziness.  Has noticed a rash (irritating) on her left lateral neck.  No sinus or allergy symptoms.   No chest pain, tightness or palpitations.  No increased shortness of breath, cough or congestion.  No nausea or vomiting.  No abdominal pain or cramping.  No acid reflux.  No bowel change, such as diarrhea, constipation, BRBPR or melana.  No urinary symptoms.  Overall she feels she is doing well.  Objective:   Physical Exam  Filed Vitals:   06/18/13 1121  BP: 110/70  Pulse: 73  Temp: 98.6 F (37 C)   Blood pressure recheck:  64/63  63 year old female in no acute distress.   HEENT:  Nares- clear.  Oropharynx - without lesions. NECK:  Supple.  Nontender.  No audible bruit.  SKIN:  Erythematous based rash with pustules.  Appears to be c/w shingles rash.   HEART:  Appears to be regular. LUNGS:  No crackles or wheezing audible.  Respirations even and unlabored.  RADIAL PULSE:  Equal bilaterally.  ABDOMEN:  Soft, nontender.  Bowel sounds present and normal.  No audible abdominal bruit.    EXTREMITIES:  No increased edema present.  DP pulses palpable and equal bilaterally.          Assessment & Plan:  HEALTH MAINTENANCE.  Physical 02/18/13.  S/P hysterectomy.  Does not need yearly pap smears.  Mammograms done through [facility].  Last mammogram 06/26/12.  Colonoscopy  03/10/13 - melanosis and internal hemorrhoids.

## 2013-06-28 NOTE — Assessment & Plan Note (Signed)
S/p knee surgery.  Doing well.  Continue physical therapy and exercises.

## 2013-06-28 NOTE — Assessment & Plan Note (Signed)
Stable

## 2013-06-28 NOTE — Assessment & Plan Note (Signed)
Previous urosepsis.  Continue to follow up with Dr Bernardo Heater.  Currently asymptomatic.

## 2013-06-28 NOTE — Assessment & Plan Note (Signed)
Being followed at the pain clinic - Dr Crisp.    

## 2013-06-28 NOTE — Assessment & Plan Note (Signed)
Rash appears to be consistent with shingles rash.  Tylenol for pain (as directed).  Valtrex 1000mg  tid as directed.  Follow.

## 2013-06-28 NOTE — Assessment & Plan Note (Signed)
Low cholesterol diet and exercise.  Had problems with statin medication previously. Will see if can find out which medication she tried previously.  Sent form previously.  Will resend.   Follow lipid panel.

## 2013-06-28 NOTE — Assessment & Plan Note (Signed)
On omeprazole.  Symptoms controlled.  Follow.   

## 2013-06-28 NOTE — Assessment & Plan Note (Signed)
Was being followed at the cancer center.   Unable to tolerate oral iron.  Follow cbc.  GI w/up as outlined.    

## 2013-06-28 NOTE — Assessment & Plan Note (Signed)
Being followed by Dr DeCastro.  Has been stable.   

## 2013-07-07 ENCOUNTER — Ambulatory Visit: Payer: Self-pay | Admitting: Pain Medicine

## 2013-07-07 DIAGNOSIS — IMO0002 Reserved for concepts with insufficient information to code with codable children: Secondary | ICD-10-CM | POA: Diagnosis not present

## 2013-07-07 DIAGNOSIS — M461 Sacroiliitis, not elsewhere classified: Secondary | ICD-10-CM | POA: Diagnosis not present

## 2013-07-07 DIAGNOSIS — Z79899 Other long term (current) drug therapy: Secondary | ICD-10-CM | POA: Diagnosis not present

## 2013-07-07 DIAGNOSIS — M546 Pain in thoracic spine: Secondary | ICD-10-CM | POA: Diagnosis not present

## 2013-07-07 DIAGNOSIS — M76899 Other specified enthesopathies of unspecified lower limb, excluding foot: Secondary | ICD-10-CM | POA: Diagnosis not present

## 2013-07-07 DIAGNOSIS — M47817 Spondylosis without myelopathy or radiculopathy, lumbosacral region: Secondary | ICD-10-CM | POA: Diagnosis not present

## 2013-07-07 DIAGNOSIS — M5137 Other intervertebral disc degeneration, lumbosacral region: Secondary | ICD-10-CM | POA: Diagnosis not present

## 2013-07-14 ENCOUNTER — Telehealth: Payer: Self-pay | Admitting: Internal Medicine

## 2013-07-14 ENCOUNTER — Encounter: Payer: Self-pay | Admitting: Internal Medicine

## 2013-07-14 ENCOUNTER — Ambulatory Visit (INDEPENDENT_AMBULATORY_CARE_PROVIDER_SITE_OTHER): Payer: Medicare Other | Admitting: Internal Medicine

## 2013-07-14 VITALS — BP 120/80 | HR 97 | Temp 98.5°F | Ht 67.0 in | Wt 185.0 lb

## 2013-07-14 DIAGNOSIS — R21 Rash and other nonspecific skin eruption: Secondary | ICD-10-CM

## 2013-07-14 MED ORDER — VALACYCLOVIR HCL 1 G PO TABS: 1000.0000 mg | ORAL_TABLET | Freq: Three times a day (TID) | ORAL | Status: DC

## 2013-07-14 NOTE — Telephone Encounter (Signed)
Please put pt on schedule with Dr. Nicki Reaper today @ 4pm for recurrent shingles. (pt already aware of appt)

## 2013-07-14 NOTE — Telephone Encounter (Signed)
Pt notified & will be here at 4pm. No other problems noted

## 2013-07-14 NOTE — Telephone Encounter (Signed)
Confirm no other problems.  I can work her in for this today.   Have her come in at 4:00.  May have to wait.  Being worked in.  Thanks.

## 2013-07-14 NOTE — Progress Notes (Signed)
Pre visit review using our clinic review tool, if applicable. No additional management support is needed unless otherwise documented below in the visit note. 

## 2013-07-14 NOTE — Telephone Encounter (Signed)
Please advise 

## 2013-07-14 NOTE — Telephone Encounter (Signed)
Patient Information:  Caller Name: Azura  Phone: [phone]  Patient: Maria Keller  Gender: Female  DOB: 07-May-1950  Age: 63 Years  PCP: Einar Pheasant  Office Follow Up:  Does the office need to follow up with this patient?: Yes  Instructions For The Office: Please f/u with pt concerning possible breakout of shingles.  Thank you.  RN Note:  Pt states she thinks she is getting shingles again.  Please f/u with pt if appt is needed or new Rx called in.   Symptoms  Reason For Call & Symptoms: 06/18/13 diagnosed with shingles on the left side neck behind the ear into haiir.  Pt reports she completed Rx.  Pt states she has blisters in her left breast with pain.  Reviewed Health History In EMR: Yes  Reviewed Medications In EMR: Yes  Reviewed Allergies In EMR: Yes  Reviewed Surgeries / Procedures: Yes  Date of Onset of Symptoms: 07/13/2013  Guideline(s) Used:  Rash or Redness - Localized  Disposition Per Guideline:   See Today in Office  Reason For Disposition Reached:   Painful rash and has multiple small blisters grouped together in one area of body (i.e., dermatomal distribution or "band" or "stripe")  Advice Given:  N/A  Patient Will Follow Care Advice:  YES

## 2013-07-16 ENCOUNTER — Telehealth: Payer: Self-pay | Admitting: *Deleted

## 2013-07-16 DIAGNOSIS — F4542 Pain disorder with related psychological factors: Secondary | ICD-10-CM | POA: Diagnosis not present

## 2013-07-16 DIAGNOSIS — F3289 Other specified depressive episodes: Secondary | ICD-10-CM | POA: Diagnosis not present

## 2013-07-16 DIAGNOSIS — F329 Major depressive disorder, single episode, unspecified: Secondary | ICD-10-CM | POA: Diagnosis not present

## 2013-07-16 NOTE — Telephone Encounter (Signed)
Hold on getting in the pool.  Continue valtrex.  Call if problems.

## 2013-07-16 NOTE — Telephone Encounter (Signed)
Pt.notified

## 2013-07-16 NOTE — Telephone Encounter (Signed)
Pt called to report that she has broken out some more in that same line underneath left breast (feels that it is definitely shingles) & its painful. She also wants to know if she should not get in her pool until her blisters clear up (her grandchildren are usually in the pool at the same time). Please advise  (She has started on the Valtrex).

## 2013-07-17 ENCOUNTER — Ambulatory Visit: Payer: Medicare Other | Admitting: Internal Medicine

## 2013-07-19 ENCOUNTER — Encounter: Payer: Self-pay | Admitting: Internal Medicine

## 2013-07-19 NOTE — Assessment & Plan Note (Signed)
Rash beneath her left breast.  Not sure if shingles rash.  Will treat with valtrex as directed.  Follow.  Notify me if problems.  She will call with update in a couple of days.  If persistent reoccurrence, may need to see infectious disease.

## 2013-07-19 NOTE — Progress Notes (Signed)
Subjective:    Patient ID: Maria Keller, female    DOB: 1950/12/23, 63 y.o.   MRN: [id number]  HPI 63 year old female with past history of fibromyalgia, GERD, depression and follicular lymphoma who comes in today as a work in with concerns regarding possible shingles.  She notices some pain under her left breast.  Then last pm, noticed a rash.  Some small "blisters".  Was concerned about reoccurring shingles.  No fever.  States otherwise doing well.  Had shingles recently on her posterior neck.  Completed valtrex.     Past Medical History  Diagnosis Date  . Follicular lymphoma     Followed by Dr Leretha Pol, s/p chemo and XRT  . Anemia     iron deficiency and B12 deficiency  . Hypercholesterolemia   . Shingles outbreak 11/26/2011  . IBS (irritable bowel syndrome)   . Chronic back pain     followed by Dr Primus Bravo  . Nephrolithiasis     followed by Dr Bernardo Heater, s/p stents  . Esophageal stricture     requiring dilatation x 2  . Hiatal hernia   . Depression   . Fibromyalgia   . GERD (gastroesophageal reflux disease)     Current Outpatient Prescriptions on File Prior to Visit  Medication Sig Dispense Refill  . albuterol (PROVENTIL HFA;VENTOLIN HFA) 108 (90 BASE) MCG/ACT inhaler Inhale 2 puffs into the lungs every 6 (six) hours as needed for wheezing.  1 Inhaler  0  . aspirin 81 MG tablet Take 81 mg by mouth daily.      . calcium carbonate (TUMS - DOSED IN MG ELEMENTAL CALCIUM) 500 MG chewable tablet Chew 1 tablet by mouth as needed.      . conjugated estrogens (PREMARIN) vaginal cream Place 1 Applicatorful vaginally daily.      . cyclobenzaprine (FLEXERIL) 5 MG tablet Take 5 mg by mouth 2 (two) times daily as needed for muscle spasms.      Marland Kitchen escitalopram (LEXAPRO) 20 MG tablet Take 20 mg by mouth daily.      . fentaNYL (DURAGESIC - DOSED MCG/HR) 100 MCG/HR Place 1 patch onto the skin every other day.      . fentaNYL (DURAGESIC - DOSED MCG/HR) 25 MCG/HR Place 1 patch onto the skin every  other day.      . fluticasone (FLONASE) 50 MCG/ACT nasal spray Place 2 sprays into both nostrils daily.  48 g  3  . furosemide (LASIX) 20 MG tablet Take 1 tablet (20 mg total) by mouth as needed.  90 tablet  1  . Omega-3 Fatty Acids (FISH OIL) 1360 MG CAPS Take 1 capsule by mouth daily.      Marland Kitchen omeprazole (PRILOSEC) 20 MG capsule Take 1 capsule (20 mg total) by mouth 2 (two) times daily.  180 capsule  3  . sennosides-docusate sodium (SENOKOT-S) 8.6-50 MG tablet Take 1 tablet by mouth as needed.       No current facility-administered medications on file prior to visit.    Review of Systems Patient denies any headache, lightheadedness or dizziness.  Has noticed a rash (irritating) beneath her left breast.   No sinus or allergy symptoms.   No chest pain, tightness or palpitations.  No increased shortness of breath, cough or congestion.  No nausea or vomiting.  No abdominal pain or cramping.  Overall she feels she is doing well.          Objective:   Physical Exam  Filed Vitals:   07/14/13  1603  BP: 120/80  Pulse: 97  Temp: 98.5 F (26.59 C)   63 year old female in no acute distress.  NECK:  Supple.  Nontender.  No audible bruit.  SKIN:  Erythematous based rash with few pustules beneath left breast.   HEART:  Appears to be regular. LUNGS:  No crackles or wheezing audible.  Respirations even and unlabored. ABDOMEN:  Soft, nontender.  Bowel sounds present and normal.  No audible abdominal bruit.           Assessment & Plan:  HEALTH MAINTENANCE.  Physical 02/18/13.  S/P hysterectomy.  Does not need yearly pap smears.  Mammograms done through [facility].  Last mammogram 06/26/12.  Colonoscopy 03/10/13 - melanosis and internal hemorrhoids.

## 2013-08-04 ENCOUNTER — Ambulatory Visit: Payer: Self-pay | Admitting: Pain Medicine

## 2013-08-04 DIAGNOSIS — IMO0002 Reserved for concepts with insufficient information to code with codable children: Secondary | ICD-10-CM | POA: Diagnosis not present

## 2013-08-04 DIAGNOSIS — M461 Sacroiliitis, not elsewhere classified: Secondary | ICD-10-CM | POA: Diagnosis not present

## 2013-08-04 DIAGNOSIS — M47817 Spondylosis without myelopathy or radiculopathy, lumbosacral region: Secondary | ICD-10-CM | POA: Diagnosis not present

## 2013-08-04 DIAGNOSIS — Z79899 Other long term (current) drug therapy: Secondary | ICD-10-CM | POA: Diagnosis not present

## 2013-08-04 DIAGNOSIS — M5137 Other intervertebral disc degeneration, lumbosacral region: Secondary | ICD-10-CM | POA: Diagnosis not present

## 2013-08-04 DIAGNOSIS — M76899 Other specified enthesopathies of unspecified lower limb, excluding foot: Secondary | ICD-10-CM | POA: Diagnosis not present

## 2013-08-04 DIAGNOSIS — M546 Pain in thoracic spine: Secondary | ICD-10-CM | POA: Diagnosis not present

## 2013-08-04 DIAGNOSIS — Z87898 Personal history of other specified conditions: Secondary | ICD-10-CM | POA: Diagnosis not present

## 2013-08-04 DIAGNOSIS — B029 Zoster without complications: Secondary | ICD-10-CM | POA: Diagnosis not present

## 2013-08-19 DIAGNOSIS — C8589 Other specified types of non-Hodgkin lymphoma, extranodal and solid organ sites: Secondary | ICD-10-CM | POA: Diagnosis not present

## 2013-08-19 DIAGNOSIS — N816 Rectocele: Secondary | ICD-10-CM | POA: Diagnosis not present

## 2013-08-19 DIAGNOSIS — N815 Vaginal enterocele: Secondary | ICD-10-CM | POA: Diagnosis not present

## 2013-08-28 ENCOUNTER — Other Ambulatory Visit: Payer: Self-pay | Admitting: Internal Medicine

## 2013-09-01 ENCOUNTER — Ambulatory Visit: Payer: Self-pay | Admitting: Pain Medicine

## 2013-09-01 DIAGNOSIS — Z87898 Personal history of other specified conditions: Secondary | ICD-10-CM | POA: Diagnosis not present

## 2013-09-01 DIAGNOSIS — M546 Pain in thoracic spine: Secondary | ICD-10-CM | POA: Diagnosis not present

## 2013-09-01 DIAGNOSIS — M62838 Other muscle spasm: Secondary | ICD-10-CM | POA: Diagnosis not present

## 2013-09-01 DIAGNOSIS — Z79899 Other long term (current) drug therapy: Secondary | ICD-10-CM | POA: Diagnosis not present

## 2013-09-01 DIAGNOSIS — N952 Postmenopausal atrophic vaginitis: Secondary | ICD-10-CM | POA: Diagnosis not present

## 2013-09-01 DIAGNOSIS — N815 Vaginal enterocele: Secondary | ICD-10-CM | POA: Diagnosis not present

## 2013-09-01 DIAGNOSIS — M76899 Other specified enthesopathies of unspecified lower limb, excluding foot: Secondary | ICD-10-CM | POA: Diagnosis not present

## 2013-09-01 DIAGNOSIS — B0229 Other postherpetic nervous system involvement: Secondary | ICD-10-CM | POA: Diagnosis not present

## 2013-09-01 DIAGNOSIS — M5137 Other intervertebral disc degeneration, lumbosacral region: Secondary | ICD-10-CM | POA: Diagnosis not present

## 2013-09-01 DIAGNOSIS — M47817 Spondylosis without myelopathy or radiculopathy, lumbosacral region: Secondary | ICD-10-CM | POA: Diagnosis not present

## 2013-09-01 DIAGNOSIS — M461 Sacroiliitis, not elsewhere classified: Secondary | ICD-10-CM | POA: Diagnosis not present

## 2013-09-01 DIAGNOSIS — IMO0002 Reserved for concepts with insufficient information to code with codable children: Secondary | ICD-10-CM | POA: Diagnosis not present

## 2013-09-03 DIAGNOSIS — F3289 Other specified depressive episodes: Secondary | ICD-10-CM | POA: Diagnosis not present

## 2013-09-03 DIAGNOSIS — F4542 Pain disorder with related psychological factors: Secondary | ICD-10-CM | POA: Diagnosis not present

## 2013-09-03 DIAGNOSIS — F329 Major depressive disorder, single episode, unspecified: Secondary | ICD-10-CM | POA: Diagnosis not present

## 2013-09-15 DIAGNOSIS — K5909 Other constipation: Secondary | ICD-10-CM | POA: Diagnosis not present

## 2013-09-15 DIAGNOSIS — IMO0002 Reserved for concepts with insufficient information to code with codable children: Secondary | ICD-10-CM | POA: Diagnosis not present

## 2013-09-15 DIAGNOSIS — N815 Vaginal enterocele: Secondary | ICD-10-CM | POA: Diagnosis not present

## 2013-09-15 DIAGNOSIS — N816 Rectocele: Secondary | ICD-10-CM | POA: Diagnosis not present

## 2013-09-22 DIAGNOSIS — N815 Vaginal enterocele: Secondary | ICD-10-CM | POA: Diagnosis not present

## 2013-09-22 DIAGNOSIS — N816 Rectocele: Secondary | ICD-10-CM | POA: Diagnosis not present

## 2013-10-01 DIAGNOSIS — N393 Stress incontinence (female) (male): Secondary | ICD-10-CM | POA: Diagnosis not present

## 2013-10-01 DIAGNOSIS — K5909 Other constipation: Secondary | ICD-10-CM | POA: Diagnosis not present

## 2013-10-01 DIAGNOSIS — N816 Rectocele: Secondary | ICD-10-CM | POA: Diagnosis not present

## 2013-10-06 ENCOUNTER — Ambulatory Visit: Payer: Self-pay | Admitting: Pain Medicine

## 2013-10-06 DIAGNOSIS — M546 Pain in thoracic spine: Secondary | ICD-10-CM | POA: Diagnosis not present

## 2013-10-06 DIAGNOSIS — IMO0002 Reserved for concepts with insufficient information to code with codable children: Secondary | ICD-10-CM | POA: Diagnosis not present

## 2013-10-06 DIAGNOSIS — M47817 Spondylosis without myelopathy or radiculopathy, lumbosacral region: Secondary | ICD-10-CM | POA: Diagnosis not present

## 2013-10-06 DIAGNOSIS — M76899 Other specified enthesopathies of unspecified lower limb, excluding foot: Secondary | ICD-10-CM | POA: Diagnosis not present

## 2013-10-21 ENCOUNTER — Encounter: Payer: Self-pay | Admitting: Internal Medicine

## 2013-10-21 ENCOUNTER — Ambulatory Visit (INDEPENDENT_AMBULATORY_CARE_PROVIDER_SITE_OTHER): Payer: Medicare Other | Admitting: Internal Medicine

## 2013-10-21 VITALS — BP 120/70 | HR 88 | Temp 99.0°F | Ht 67.0 in | Wt 186.2 lb

## 2013-10-21 DIAGNOSIS — IMO0001 Reserved for inherently not codable concepts without codable children: Secondary | ICD-10-CM

## 2013-10-21 DIAGNOSIS — C8589 Other specified types of non-Hodgkin lymphoma, extranodal and solid organ sites: Secondary | ICD-10-CM | POA: Diagnosis not present

## 2013-10-21 DIAGNOSIS — Z1239 Encounter for other screening for malignant neoplasm of breast: Secondary | ICD-10-CM | POA: Diagnosis not present

## 2013-10-21 DIAGNOSIS — M797 Fibromyalgia: Secondary | ICD-10-CM

## 2013-10-21 DIAGNOSIS — D649 Anemia, unspecified: Secondary | ICD-10-CM

## 2013-10-21 DIAGNOSIS — E7800 Pure hypercholesterolemia, unspecified: Secondary | ICD-10-CM

## 2013-10-21 DIAGNOSIS — M549 Dorsalgia, unspecified: Secondary | ICD-10-CM

## 2013-10-21 DIAGNOSIS — C8590 Non-Hodgkin lymphoma, unspecified, unspecified site: Secondary | ICD-10-CM

## 2013-10-21 DIAGNOSIS — G8929 Other chronic pain: Secondary | ICD-10-CM

## 2013-10-21 DIAGNOSIS — K469 Unspecified abdominal hernia without obstruction or gangrene: Secondary | ICD-10-CM

## 2013-10-21 DIAGNOSIS — K219 Gastro-esophageal reflux disease without esophagitis: Secondary | ICD-10-CM

## 2013-10-22 LAB — LIPID PANEL
CHOLESTEROL: 237 mg/dL — AB (ref 0–200)
HDL: 45.2 mg/dL (ref >39.00)
LDL Cholesterol: 158 mg/dL — ABNORMAL HIGH (ref 0–99)
NonHDL: 191.8
Total CHOL/HDL Ratio: 5
Triglycerides: 167 mg/dL — ABNORMAL HIGH (ref 0.0–149.0)
VLDL: 33.4 mg/dL (ref 0.0–40.0)

## 2013-10-22 LAB — CBC WITH DIFFERENTIAL/PLATELET
BASOS PCT: 0.3 % (ref 0.0–3.0)
Basophils Absolute: 0 10*3/uL (ref 0.0–0.1)
Eosinophils Absolute: 0.2 10*3/uL (ref 0.0–0.7)
Eosinophils Relative: 1.9 % (ref 0.0–5.0)
HCT: 37.7 % (ref 36.0–46.0)
Hemoglobin: 12.3 g/dL (ref 12.0–15.0)
LYMPHS PCT: 39.2 % (ref 12.0–46.0)
Lymphs Abs: 3.5 10*3/uL (ref 0.7–4.0)
MCHC: 32.7 g/dL (ref 30.0–36.0)
MCV: 82.9 fl (ref 78.0–100.0)
Monocytes Absolute: 0.5 10*3/uL (ref 0.1–1.0)
Monocytes Relative: 5.9 % (ref 3.0–12.0)
NEUTROS ABS: 4.7 10*3/uL (ref 1.4–7.7)
Neutrophils Relative %: 52.7 % (ref 43.0–77.0)
Platelets: 212 10*3/uL (ref 150.0–400.0)
RBC: 4.54 Mil/uL (ref 3.87–5.11)
RDW: 16 % — ABNORMAL HIGH (ref 11.5–15.5)
WBC: 8.9 10*3/uL (ref 4.0–10.5)

## 2013-10-22 LAB — COMPREHENSIVE METABOLIC PANEL
ALT: 28 U/L (ref 0–35)
AST: 32 U/L (ref 0–37)
Albumin: 4.1 g/dL (ref 3.5–5.2)
Alkaline Phosphatase: 82 U/L (ref 39–117)
BUN: 14 mg/dL (ref 6–23)
CALCIUM: 9.1 mg/dL (ref 8.4–10.5)
CHLORIDE: 101 meq/L (ref 96–112)
CO2: 31 mEq/L (ref 19–32)
Creatinine, Ser: 0.9 mg/dL (ref 0.4–1.2)
GFR: 68.56 mL/min (ref >60.00)
GLUCOSE: 104 mg/dL — AB (ref 70–99)
Potassium: 4.5 mEq/L (ref 3.5–5.1)
Sodium: 141 mEq/L (ref 135–145)
Total Bilirubin: 0.4 mg/dL (ref 0.2–1.2)
Total Protein: 7.5 g/dL (ref 6.0–8.3)

## 2013-10-22 LAB — FERRITIN: Ferritin: 24.3 ng/mL (ref 10.0–291.0)

## 2013-10-23 ENCOUNTER — Encounter: Payer: Self-pay | Admitting: *Deleted

## 2013-10-25 ENCOUNTER — Encounter: Payer: Self-pay | Admitting: Internal Medicine

## 2013-10-25 DIAGNOSIS — K469 Unspecified abdominal hernia without obstruction or gangrene: Secondary | ICD-10-CM | POA: Insufficient documentation

## 2013-10-25 NOTE — Assessment & Plan Note (Signed)
Stable

## 2013-10-25 NOTE — Assessment & Plan Note (Signed)
Was being followed at the cancer center.   Unable to tolerate oral iron.  Follow cbc.  GI w/up as outlined.

## 2013-10-25 NOTE — Assessment & Plan Note (Signed)
Being followed at the pain clinic - Dr Primus Bravo.

## 2013-10-25 NOTE — Assessment & Plan Note (Signed)
Low cholesterol diet and exercise.  Had problems with statin medication previously.  Discussed with her today.  She wants to work on diet and exercise.  Follow lipid panel.

## 2013-10-25 NOTE — Assessment & Plan Note (Signed)
Being followed by Dr Leretha Pol.  Has been stable.

## 2013-10-25 NOTE — Assessment & Plan Note (Signed)
On omeprazole.  Symptoms controlled.  Follow.   

## 2013-10-25 NOTE — Progress Notes (Signed)
Subjective:    Patient ID: Maria Keller, female    DOB: 03/15/1950, 63 y.o.   MRN: [id number]  HPI 62 year old female with past history of fibromyalgia, GERD, depression and follicular lymphoma who comes in today for a scheduled follow up.  She states she is doing better.  Is s/p  right knee arthroscopy.  Doing well.  Knee better.  Went to physical therapy and doing her exercises at home.  No chest pain or tightness.  No sob.  No fever. Eating and drinking well.  No nausea or vomiting.  Bowels stable.  No urinary symptoms.   Did recently f/u with Dr Enzo Bi for her enterocele.  Was referred to Dr Judeth Horn.  Planning for surgery 11/02/13.  She is concerned about weight gain.  Discussed diet and exercise.     Past Medical History  Diagnosis Date  . Follicular lymphoma     Followed by Dr Leretha Pol, s/p chemo and XRT  . Anemia     iron deficiency and B12 deficiency  . Hypercholesterolemia   . Shingles outbreak 11/26/2011  . IBS (irritable bowel syndrome)   . Chronic back pain     followed by Dr Primus Bravo  . Nephrolithiasis     followed by Dr Bernardo Heater, s/p stents  . Esophageal stricture     requiring dilatation x 2  . Hiatal hernia   . Depression   . Fibromyalgia   . GERD (gastroesophageal reflux disease)     Current Outpatient Prescriptions on File Prior to Visit  Medication Sig Dispense Refill  . aspirin 81 MG tablet Take 81 mg by mouth daily.      . calcium carbonate (TUMS - DOSED IN MG ELEMENTAL CALCIUM) 500 MG chewable tablet Chew 1 tablet by mouth as needed.      . conjugated estrogens (PREMARIN) vaginal cream Place 1 Applicatorful vaginally daily.      . cyclobenzaprine (FLEXERIL) 5 MG tablet Take 5 mg by mouth 2 (two) times daily as needed for muscle spasms.      Marland Kitchen escitalopram (LEXAPRO) 20 MG tablet Take 20 mg by mouth daily.      . fentaNYL (DURAGESIC - DOSED MCG/HR) 100 MCG/HR Place 1 patch onto the skin every other day.      . fentaNYL (DURAGESIC - DOSED MCG/HR) 25 MCG/HR  Place 1 patch onto the skin every other day.      . fluticasone (FLONASE) 50 MCG/ACT nasal spray Place 2 sprays into both nostrils daily.  48 g  3  . furosemide (LASIX) 20 MG tablet TAKE 1 TABLET BY MOUTH EVERY DAY AS NEEDED  90 tablet  1  . Omega-3 Fatty Acids (FISH OIL) 1360 MG CAPS Take 1 capsule by mouth daily.      Marland Kitchen omeprazole (PRILOSEC) 20 MG capsule Take 1 capsule (20 mg total) by mouth 2 (two) times daily.  180 capsule  3  . sennosides-docusate sodium (SENOKOT-S) 8.6-50 MG tablet Take 1 tablet by mouth as needed.       No current facility-administered medications on file prior to visit.    Review of Systems Patient denies any headache, lightheadedness or dizziness.  No sinus or allergy symptoms.   No chest pain, tightness or palpitations.  No increased shortness of breath, cough or congestion.  No nausea or vomiting.  No abdominal pain or cramping.  No acid reflux.  No bowel change, such as diarrhea, constipation, BRBPR or melana.  No urinary symptoms.  Overall she feels she is doing  well. Planning for surgery with Dr Judeth Horn 11/02/13.  Has f/u planned next week.  Discussed diet and exercise.            Objective:   Physical Exam  Filed Vitals:   10/21/13 1112  BP: 120/70  Pulse: 88  Temp: 99 F (37.2 C)   Blood pressure recheck:  26/21  63 year old female in no acute distress.   HEENT:  Nares- clear.  Oropharynx - without lesions. NECK:  Supple.  Nontender.  No audible bruit.    HEART:  Appears to be regular. LUNGS:  No crackles or wheezing audible.  Respirations even and unlabored.  RADIAL PULSE:  Equal bilaterally.  ABDOMEN:  Soft, nontender.  Bowel sounds present and normal.  No audible abdominal bruit.    EXTREMITIES:  No increased edema present.  DP pulses palpable and equal bilaterally.          Assessment & Plan:  DESIRE FOR WEIGHT LOSS.  Discussed diet and exercise.  Gave her Dr Lupita Dawn diet information.  Follow.   HEALTH MAINTENANCE.  Physical 02/18/13.  S/P  hysterectomy.  Does not need yearly pap smears.  Mammograms done through [facility].  Last mammogram 06/26/12.  Overdue.  Scheduled.  Colonoscopy 03/10/13 - melanosis and internal hemorrhoids.

## 2013-10-25 NOTE — Assessment & Plan Note (Signed)
Saw Dr Enzo Bi.  Referred to Dr Judeth Horn.  Has f/u next week.  Planning for surgery 11/02/13.  She is currently asymptomatic.  No chest pain or tightness.  No sob.  Had recent knee surgery and did well.  Feel from a cardiac standpoint pt is at low risk to proceed with surgery.  Recommend close intra op and post op monitoring of heart rate and blood pressure to avoid extremes.

## 2013-10-27 DIAGNOSIS — Z87442 Personal history of urinary calculi: Secondary | ICD-10-CM | POA: Diagnosis not present

## 2013-10-27 DIAGNOSIS — IMO0001 Reserved for inherently not codable concepts without codable children: Secondary | ICD-10-CM | POA: Diagnosis not present

## 2013-10-27 DIAGNOSIS — M549 Dorsalgia, unspecified: Secondary | ICD-10-CM | POA: Diagnosis not present

## 2013-10-27 DIAGNOSIS — G4730 Sleep apnea, unspecified: Secondary | ICD-10-CM | POA: Insufficient documentation

## 2013-10-27 DIAGNOSIS — Z01810 Encounter for preprocedural cardiovascular examination: Secondary | ICD-10-CM | POA: Diagnosis not present

## 2013-10-27 DIAGNOSIS — N816 Rectocele: Secondary | ICD-10-CM | POA: Diagnosis not present

## 2013-10-27 DIAGNOSIS — K219 Gastro-esophageal reflux disease without esophagitis: Secondary | ICD-10-CM | POA: Diagnosis not present

## 2013-10-27 DIAGNOSIS — E7800 Pure hypercholesterolemia, unspecified: Secondary | ICD-10-CM | POA: Diagnosis not present

## 2013-10-27 DIAGNOSIS — C8589 Other specified types of non-Hodgkin lymphoma, extranodal and solid organ sites: Secondary | ICD-10-CM | POA: Diagnosis not present

## 2013-10-27 DIAGNOSIS — D649 Anemia, unspecified: Secondary | ICD-10-CM | POA: Diagnosis not present

## 2013-11-02 DIAGNOSIS — Z79899 Other long term (current) drug therapy: Secondary | ICD-10-CM | POA: Diagnosis not present

## 2013-11-02 DIAGNOSIS — C8299 Follicular lymphoma, unspecified, extranodal and solid organ sites: Secondary | ICD-10-CM | POA: Diagnosis not present

## 2013-11-02 DIAGNOSIS — IMO0001 Reserved for inherently not codable concepts without codable children: Secondary | ICD-10-CM | POA: Diagnosis not present

## 2013-11-02 DIAGNOSIS — G4730 Sleep apnea, unspecified: Secondary | ICD-10-CM | POA: Diagnosis not present

## 2013-11-02 DIAGNOSIS — N816 Rectocele: Secondary | ICD-10-CM | POA: Diagnosis not present

## 2013-11-02 DIAGNOSIS — K219 Gastro-esophageal reflux disease without esophagitis: Secondary | ICD-10-CM | POA: Diagnosis not present

## 2013-11-10 ENCOUNTER — Ambulatory Visit: Payer: Self-pay | Admitting: Internal Medicine

## 2013-11-10 ENCOUNTER — Encounter: Payer: Self-pay | Admitting: Internal Medicine

## 2013-11-10 ENCOUNTER — Ambulatory Visit: Payer: Self-pay | Admitting: Pain Medicine

## 2013-11-10 ENCOUNTER — Ambulatory Visit (INDEPENDENT_AMBULATORY_CARE_PROVIDER_SITE_OTHER): Payer: Medicare Other | Admitting: Internal Medicine

## 2013-11-10 ENCOUNTER — Telehealth: Payer: Self-pay | Admitting: Internal Medicine

## 2013-11-10 VITALS — BP 110/60 | HR 98 | Temp 99.1°F | Ht 67.0 in | Wt 192.2 lb

## 2013-11-10 DIAGNOSIS — K469 Unspecified abdominal hernia without obstruction or gangrene: Secondary | ICD-10-CM

## 2013-11-10 DIAGNOSIS — R0789 Other chest pain: Secondary | ICD-10-CM | POA: Diagnosis not present

## 2013-11-10 DIAGNOSIS — M5416 Radiculopathy, lumbar region: Secondary | ICD-10-CM | POA: Diagnosis not present

## 2013-11-10 DIAGNOSIS — M7070 Other bursitis of hip, unspecified hip: Secondary | ICD-10-CM | POA: Diagnosis not present

## 2013-11-10 DIAGNOSIS — M546 Pain in thoracic spine: Secondary | ICD-10-CM | POA: Diagnosis not present

## 2013-11-10 DIAGNOSIS — M47817 Spondylosis without myelopathy or radiculopathy, lumbosacral region: Secondary | ICD-10-CM | POA: Diagnosis not present

## 2013-11-10 DIAGNOSIS — M549 Dorsalgia, unspecified: Secondary | ICD-10-CM | POA: Diagnosis not present

## 2013-11-10 DIAGNOSIS — R079 Chest pain, unspecified: Secondary | ICD-10-CM

## 2013-11-10 MED ORDER — GABAPENTIN 100 MG PO CAPS: 100.0000 mg | ORAL_CAPSULE | Freq: Three times a day (TID) | ORAL | Status: DC | PRN

## 2013-11-10 NOTE — Telephone Encounter (Signed)
Confirm no acute symptoms.  I don't have any open spots today.  She can come in this pm and see about being worked in  - between pts.  See if she can come in at 3:00-3:30.  May have to wait.  Or I can see her tomorrow at lunch.  May have to wait then as well.

## 2013-11-10 NOTE — Telephone Encounter (Signed)
Pt coming today @ 3pm & aware that there will be a wait. Please put pt on Dr.Scott schedule for chest pain

## 2013-11-10 NOTE — Progress Notes (Signed)
Pre visit review using our clinic review tool, if applicable. No additional management support is needed unless otherwise documented below in the visit note. 

## 2013-11-10 NOTE — Telephone Encounter (Signed)
Patient Information:  Caller Name: Maria Keller  Phone: [phone]  Patient: Maria Keller  Gender: Female  DOB: January 17, 1951  Age: 63 Years  PCP: Einar Pheasant  Office Follow Up:  Does the office need to follow up with this patient?: Yes  Instructions For The Office: Needs visit  RN Note:  Patient calling regarding intermittent right sided chest pain.  Describes as just behind rib cage and feels like a tender bruise to touch.  Described pain as sharp which comes and goes. Denies injury. Pain is noted with movement.  Recent surgery includes a Rectocele repair which was done on 9/28 by Provider at [facility].  She has called their facility and was advised to follow up with PCP.  Denies SOB.  Symptoms  Reason For Call & Symptoms: right breast "rib cage"  pain  Reviewed Health History In EMR: Yes  Reviewed Medications In EMR: Yes  Reviewed Allergies In EMR: Yes  Reviewed Surgeries / Procedures: Yes  Date of Onset of Symptoms: 11/08/2013  Guideline(s) Used:  Chest Pain  Disposition Per Guideline:   See Today in Office  Reason For Disposition Reached:   Intermittent chest pains persist > 3 days  Advice Given:  Call Back If:  Severe chest pain  Constant chest pain lasting longer than 5 minutes  Difficulty breathing  You become worse.  Patient Will Follow Care Advice:  YES

## 2013-11-11 ENCOUNTER — Telehealth: Payer: Self-pay | Admitting: Internal Medicine

## 2013-11-11 DIAGNOSIS — Z1231 Encounter for screening mammogram for malignant neoplasm of breast: Secondary | ICD-10-CM | POA: Diagnosis not present

## 2013-11-11 LAB — HM MAMMOGRAPHY

## 2013-11-11 NOTE — Telephone Encounter (Signed)
Notify pt cxr is clear and back xray reveals no acute bony abnormality.  Let us know if any problems.  Confirm doing ok.

## 2013-11-11 NOTE — Telephone Encounter (Signed)
Called and advised patient of results,  verbalized understanding. Will call back with persistent or worsening symptoms.

## 2013-11-15 ENCOUNTER — Encounter: Payer: Self-pay | Admitting: Internal Medicine

## 2013-11-15 DIAGNOSIS — R0602 Shortness of breath: Secondary | ICD-10-CM | POA: Insufficient documentation

## 2013-11-15 NOTE — Assessment & Plan Note (Signed)
Reproducible on exam.  Increased pain over the thoracic spine.  Appears to be more consistent with msk/nerve pain.  Already on ibuprofen and tylenol.  Has a duragesic and oxycodone prn.  Will start low dose gabapentin and see if this helps with pain.  Check thoracic spine xray and chest xray.  Follow.

## 2013-11-15 NOTE — Assessment & Plan Note (Signed)
Saw Dr Enzo Bi.  Referred to Dr Judeth Horn.  Is s/p surgery 11/02/13.

## 2013-11-15 NOTE — Progress Notes (Signed)
Subjective:    Patient ID: Maria Keller, female    DOB: 14-Jun-1950, 63 y.o.   MRN: [id number]  Chest Pain   63 year old female with past history of fibromyalgia, GERD, depression and follicular lymphoma who comes in today as a work in with concerns regarding increased chest pain (under her right breast) and pain that extends around to her back.   Had surgery 11/02/13 for enterocele.  Has noticed increased pain under her right breast since.  Constant.   Described as a burning/hot sensation.  Hurts to palpate the area.  No known injury or trauma.  She has a duragesic patch for her chronic pain.  Has also taken ibuprofen and tylenol.  Also has oxycodone prn.  Still with pain.  No rash.  Has had reoccurring shingles.  On valtrex - low dose daily.  No rash present.  No sob.  Does hurt to take a deep breath.  No other chest pain or tightness.     Past Medical History  Diagnosis Date  . Follicular lymphoma     Followed by Dr Leretha Pol, s/p chemo and XRT  . Anemia     iron deficiency and B12 deficiency  . Hypercholesterolemia   . Shingles outbreak 11/26/2011  . IBS (irritable bowel syndrome)   . Chronic back pain     followed by Dr Primus Bravo  . Nephrolithiasis     followed by Dr Bernardo Heater, s/p stents  . Esophageal stricture     requiring dilatation x 2  . Hiatal hernia   . Depression   . Fibromyalgia   . GERD (gastroesophageal reflux disease)     Current Outpatient Prescriptions on File Prior to Visit  Medication Sig Dispense Refill  . aspirin 81 MG tablet Take 81 mg by mouth daily.      . calcium carbonate (TUMS - DOSED IN MG ELEMENTAL CALCIUM) 500 MG chewable tablet Chew 1 tablet by mouth as needed.      . conjugated estrogens (PREMARIN) vaginal cream Place 1 Applicatorful vaginally daily.      Marland Kitchen escitalopram (LEXAPRO) 20 MG tablet Take 20 mg by mouth daily.      . fentaNYL (DURAGESIC - DOSED MCG/HR) 100 MCG/HR Place 1 patch onto the skin every other day.      . fentaNYL (DURAGESIC - DOSED  MCG/HR) 25 MCG/HR Place 1 patch onto the skin every other day.      . fluticasone (FLONASE) 50 MCG/ACT nasal spray Place 2 sprays into both nostrils daily.  48 g  3  . furosemide (LASIX) 20 MG tablet TAKE 1 TABLET BY MOUTH EVERY DAY AS NEEDED  90 tablet  1  . Omega-3 Fatty Acids (FISH OIL) 1360 MG CAPS Take 1 capsule by mouth daily.      Marland Kitchen omeprazole (PRILOSEC) 20 MG capsule Take 1 capsule (20 mg total) by mouth 2 (two) times daily.  180 capsule  3  . sennosides-docusate sodium (SENOKOT-S) 8.6-50 MG tablet Take 1 tablet by mouth as needed.      . valACYclovir (VALTREX) 500 MG tablet Take 500 mg by mouth daily.       No current facility-administered medications on file prior to visit.    Review of Systems  Cardiovascular: Positive for chest pain.  Patient denies any headache, lightheadedness or dizziness.  No sinus or allergy symptoms.   No chest tightness or palpitations.  Does report the right side chest pain as outlined.  Under her right breast.  No rash.  Persistent.  No known injury or trauma.  No increased shortness of breath, cough or congestion.  No nausea or vomiting.  No abdominal pain or cramping.  No acid reflux.  Had the recent surgery as outlined.            Objective:   Physical Exam  Filed Vitals:   11/10/13 1540  BP: 110/60  Pulse: 98  Temp: 99.1 F (66.41 C)   63 year old female in no acute distress.   HEENT:  Nares- clear.  Oropharynx - without lesions. NECK:  Supple.  Nontender.   HEART:  Appears to be regular. LUNGS:  No crackles or wheezing audible.  Respirations even and unlabored.  Good breath sounds bilaterally.   CHEST WALL.  Increased tenderness to palpation under her right breast and extending laterally.   RADIAL PULSE:  Equal bilaterally.  ABDOMEN:  Soft, nontender.  Bowel sounds present and normal.  No audible abdominal bruit.     BACK:  Tenderness to palpation over the thoracic spine.       Assessment & Plan:  HEALTH MAINTENANCE.  Physical 02/18/13.   S/P hysterectomy.  Does not need yearly pap smears.  Mammograms done through [facility].  Last mammogram 06/26/12.  Overdue.  Scheduled.  Colonoscopy 03/10/13 - melanosis and internal hemorrhoids.

## 2013-11-23 ENCOUNTER — Encounter: Payer: Self-pay | Admitting: *Deleted

## 2013-12-08 ENCOUNTER — Ambulatory Visit: Payer: Self-pay | Admitting: Pain Medicine

## 2013-12-08 DIAGNOSIS — M47817 Spondylosis without myelopathy or radiculopathy, lumbosacral region: Secondary | ICD-10-CM | POA: Diagnosis not present

## 2013-12-08 DIAGNOSIS — M546 Pain in thoracic spine: Secondary | ICD-10-CM | POA: Diagnosis not present

## 2013-12-08 DIAGNOSIS — M7070 Other bursitis of hip, unspecified hip: Secondary | ICD-10-CM | POA: Diagnosis not present

## 2013-12-08 DIAGNOSIS — M5416 Radiculopathy, lumbar region: Secondary | ICD-10-CM | POA: Diagnosis not present

## 2013-12-14 ENCOUNTER — Ambulatory Visit: Payer: Self-pay | Admitting: Pain Medicine

## 2013-12-14 DIAGNOSIS — M546 Pain in thoracic spine: Secondary | ICD-10-CM | POA: Diagnosis not present

## 2013-12-14 DIAGNOSIS — B0229 Other postherpetic nervous system involvement: Secondary | ICD-10-CM | POA: Diagnosis not present

## 2013-12-14 DIAGNOSIS — R0782 Intercostal pain: Secondary | ICD-10-CM | POA: Diagnosis not present

## 2013-12-22 ENCOUNTER — Encounter: Payer: Self-pay | Admitting: Internal Medicine

## 2013-12-23 DIAGNOSIS — F321 Major depressive disorder, single episode, moderate: Secondary | ICD-10-CM | POA: Diagnosis not present

## 2013-12-23 DIAGNOSIS — G894 Chronic pain syndrome: Secondary | ICD-10-CM | POA: Diagnosis not present

## 2014-01-07 ENCOUNTER — Ambulatory Visit: Payer: Self-pay | Admitting: Pain Medicine

## 2014-01-07 DIAGNOSIS — B0223 Postherpetic polyneuropathy: Secondary | ICD-10-CM | POA: Diagnosis not present

## 2014-01-07 DIAGNOSIS — M791 Myalgia: Secondary | ICD-10-CM | POA: Diagnosis not present

## 2014-01-07 DIAGNOSIS — M461 Sacroiliitis, not elsewhere classified: Secondary | ICD-10-CM | POA: Diagnosis not present

## 2014-01-07 DIAGNOSIS — R0782 Intercostal pain: Secondary | ICD-10-CM | POA: Diagnosis not present

## 2014-01-07 DIAGNOSIS — M546 Pain in thoracic spine: Secondary | ICD-10-CM | POA: Diagnosis not present

## 2014-02-08 ENCOUNTER — Ambulatory Visit: Payer: Self-pay | Admitting: Pain Medicine

## 2014-02-08 DIAGNOSIS — B0223 Postherpetic polyneuropathy: Secondary | ICD-10-CM | POA: Diagnosis not present

## 2014-02-08 DIAGNOSIS — M791 Myalgia: Secondary | ICD-10-CM | POA: Diagnosis not present

## 2014-02-08 DIAGNOSIS — M47896 Other spondylosis, lumbar region: Secondary | ICD-10-CM | POA: Diagnosis not present

## 2014-02-08 DIAGNOSIS — R0782 Intercostal pain: Secondary | ICD-10-CM | POA: Diagnosis not present

## 2014-02-08 DIAGNOSIS — M461 Sacroiliitis, not elsewhere classified: Secondary | ICD-10-CM | POA: Diagnosis not present

## 2014-02-22 ENCOUNTER — Encounter: Payer: Self-pay | Admitting: Internal Medicine

## 2014-02-22 ENCOUNTER — Ambulatory Visit (INDEPENDENT_AMBULATORY_CARE_PROVIDER_SITE_OTHER): Payer: Medicare Other | Admitting: Internal Medicine

## 2014-02-22 VITALS — BP 104/65 | HR 70 | Temp 98.7°F | Ht 67.5 in | Wt 187.2 lb

## 2014-02-22 DIAGNOSIS — Z23 Encounter for immunization: Secondary | ICD-10-CM | POA: Diagnosis not present

## 2014-02-22 DIAGNOSIS — E7800 Pure hypercholesterolemia, unspecified: Secondary | ICD-10-CM

## 2014-02-22 DIAGNOSIS — C8590 Non-Hodgkin lymphoma, unspecified, unspecified site: Secondary | ICD-10-CM | POA: Diagnosis not present

## 2014-02-22 DIAGNOSIS — K219 Gastro-esophageal reflux disease without esophagitis: Secondary | ICD-10-CM

## 2014-02-22 DIAGNOSIS — M549 Dorsalgia, unspecified: Secondary | ICD-10-CM | POA: Diagnosis not present

## 2014-02-22 DIAGNOSIS — D649 Anemia, unspecified: Secondary | ICD-10-CM | POA: Diagnosis not present

## 2014-02-22 DIAGNOSIS — G8929 Other chronic pain: Secondary | ICD-10-CM

## 2014-02-22 DIAGNOSIS — R635 Abnormal weight gain: Secondary | ICD-10-CM | POA: Diagnosis not present

## 2014-02-22 DIAGNOSIS — R0789 Other chest pain: Secondary | ICD-10-CM

## 2014-02-22 LAB — LIPID PANEL
CHOL/HDL RATIO: 5
Cholesterol: 216 mg/dL — ABNORMAL HIGH (ref 0–200)
HDL: 40.7 mg/dL (ref >39.00)
LDL Cholesterol: 151 mg/dL — ABNORMAL HIGH (ref 0–99)
NonHDL: 175.3
Triglycerides: 120 mg/dL (ref 0.0–149.0)
VLDL: 24 mg/dL (ref 0.0–40.0)

## 2014-02-22 LAB — TSH: TSH: 1.08 u[IU]/mL (ref 0.35–4.50)

## 2014-02-22 MED ORDER — OMEPRAZOLE 20 MG PO CPDR: 20.0000 mg | DELAYED_RELEASE_CAPSULE | Freq: Two times a day (BID) | ORAL | Status: DC

## 2014-02-22 MED ORDER — FUROSEMIDE 20 MG PO TABS: 20.0000 mg | ORAL_TABLET | Freq: Every day | ORAL | Status: DC | PRN

## 2014-02-22 NOTE — Progress Notes (Signed)
Pre visit review using our clinic review tool, if applicable. No additional management support is needed unless otherwise documented below in the visit note. 

## 2014-02-22 NOTE — Progress Notes (Signed)
Subjective:    Patient ID: Maria Keller, female    DOB: 20-Apr-1950, 64 y.o.   MRN: [id number]  HPI 64 year old female with past history of fibromyalgia, GERD, depression and follicular lymphoma who comes in today to follow up on these issues as well as for a complete physical exam.  She states she is doing relatively well.  Is still having an intermittent burning and stinging pain - beneath right breast.  Flares intermittently.  Has had 2 flares in the last month.  Each flare may last 3-4 days.  The gabapentin makes her sleepy.  Takes tylenol and an antiinflammatory.  Helps some.  Can't take too many antiinflammatories.  Upsets her stomach.  No chest pain or tightness.  No sob.  No fever. Eating and drinking well.  No nausea or vomiting.  Bowels stable.  No urinary symptoms.  She is concerned about weight gain.  Discussed diet and exercise.   She has started walking.     Past Medical History  Diagnosis Date  . Follicular lymphoma     Followed by Dr Leretha Pol, s/p chemo and XRT  . Anemia     iron deficiency and B12 deficiency  . Hypercholesterolemia   . Shingles outbreak 11/26/2011  . IBS (irritable bowel syndrome)   . Chronic back pain     followed by Dr Primus Bravo  . Nephrolithiasis     followed by Dr Bernardo Heater, s/p stents  . Esophageal stricture     requiring dilatation x 2  . Hiatal hernia   . Depression   . Fibromyalgia   . GERD (gastroesophageal reflux disease)     Current Outpatient Prescriptions on File Prior to Visit  Medication Sig Dispense Refill  . aspirin 81 MG tablet Take 81 mg by mouth daily.    . calcium carbonate (TUMS - DOSED IN MG ELEMENTAL CALCIUM) 500 MG chewable tablet Chew 1 tablet by mouth as needed.    . conjugated estrogens (PREMARIN) vaginal cream Place 1 Applicatorful vaginally daily.    Marland Kitchen escitalopram (LEXAPRO) 20 MG tablet Take 20 mg by mouth daily.    . fentaNYL (DURAGESIC - DOSED MCG/HR) 25 MCG/HR Place 1 patch onto the skin every other day.    .  fluticasone (FLONASE) 50 MCG/ACT nasal spray Place 2 sprays into both nostrils daily. 48 g 3  . gabapentin (NEURONTIN) 100 MG capsule Take 1 capsule (100 mg total) by mouth 3 (three) times daily as needed. 90 capsule 1  . Omega-3 Fatty Acids (FISH OIL) 1360 MG CAPS Take 1 capsule by mouth daily.    . sennosides-docusate sodium (SENOKOT-S) 8.6-50 MG tablet Take 1 tablet by mouth as needed.    . valACYclovir (VALTREX) 500 MG tablet Take 500 mg by mouth daily.     No current facility-administered medications on file prior to visit.    Review of Systems Patient denies any headache, lightheadedness or dizziness.  No sinus or allergy symptoms.   No chest pain, tightness or palpitations.  No increased shortness of breath, cough or congestion.  No nausea or vomiting.  No abdominal pain or cramping.  No acid reflux.  No bowel change, such as diarrhea, constipation, BRBPR or melana.  No urinary symptoms.  Persistent intermittent burning and stinging beneath the right breast.   See above.  Two flares in the last month.           Objective:   Physical Exam  Filed Vitals:   02/22/14 1029  BP: 104/65  Pulse:  70  Temp: 98.7 F (37.1 C)   Blood pressure recheck:  30/40  64 year old female in no acute distress.   HEENT:  Nares- clear.  Oropharynx - without lesions. NECK:  Supple.  Nontender.  No audible bruit.  HEART:  Appears to be regular. LUNGS:  No crackles or wheezing audible.  Respirations even and unlabored.  RADIAL PULSE:  Equal bilaterally.    BREASTS:  No nipple discharge or nipple retraction present.  Could not appreciate any distinct nodules or axillary adenopathy.  ABDOMEN:  Soft, nontender.  Bowel sounds present and normal.  No audible abdominal bruit.  GU:  Not performed.     EXTREMITIES:  No increased edema present.  DP pulses palpable and equal bilaterally.          Assessment & Plan:  1. Encounter for immunization Flu shot given today.   2. Lymphoma Followed by Dr  Leretha Pol.  Due f/u this week.  Stable.  He will do remainder of labs.    3. Anemia, unspecified anemia type CBC to be rechecked through Dr Fredderick Phenix office.   03/10/12 - colonoscopy - melanosis in the colon, internal hemorrhoids otherwise normal.   03/10/12 EGD - grade A reflux esophagitis, gastritis, single gastric polyp, erythematous duodenopathy.    4. Hypercholesterolemia Low cholesterol diet and exercise.   - Lipid panel  5. Other chest pain Describes the lower right anterior chest pain.  Described as a stinging and burning pain.  Occurs intermittently.  Dr Primus Bravo - nerve block.  Helped for a few days.  Did not tolerate neurontin.  Will discuss with Dr Primus Bravo regarding other pain management.   6. Gastroesophageal reflux disease, esophagitis presence not specified Controlled on omeprazole.   7. Weight gain Discussed diet and exercise.   She is walking now. - TSH  8. Chronic back pain Followed by Dr Primus Bravo.    9. DESIRE FOR WEIGHT LOSS.  Discussed diet and exercise.  she is walking.  Follow.   HEALTH MAINTENANCE.  Physical today.  S/P hysterectomy.  Does not need yearly pap smears.  Mammograms done through [facility].  Last mammogram 11/11/13 - ok.  Colonoscopy 03/10/13 - melanosis and internal hemorrhoids.

## 2014-02-24 DIAGNOSIS — C8238 Follicular lymphoma grade IIIa, lymph nodes of multiple sites: Secondary | ICD-10-CM | POA: Diagnosis not present

## 2014-02-24 DIAGNOSIS — C8590 Non-Hodgkin lymphoma, unspecified, unspecified site: Secondary | ICD-10-CM | POA: Diagnosis not present

## 2014-02-25 ENCOUNTER — Other Ambulatory Visit: Payer: Self-pay | Admitting: *Deleted

## 2014-02-25 MED ORDER — PRAVASTATIN SODIUM 10 MG PO TABS: 10.0000 mg | ORAL_TABLET | Freq: Every day | ORAL | Status: DC

## 2014-02-25 NOTE — Telephone Encounter (Signed)
Pt agreed to try Pravastatin. Rx sent to [place] & lap appt scheduled in 6 wks to recheck liver panel.

## 2014-03-09 ENCOUNTER — Ambulatory Visit: Payer: Self-pay | Admitting: Pain Medicine

## 2014-03-09 DIAGNOSIS — M542 Cervicalgia: Secondary | ICD-10-CM | POA: Diagnosis not present

## 2014-03-09 DIAGNOSIS — M25551 Pain in right hip: Secondary | ICD-10-CM | POA: Diagnosis not present

## 2014-03-09 DIAGNOSIS — R0782 Intercostal pain: Secondary | ICD-10-CM | POA: Diagnosis not present

## 2014-03-09 DIAGNOSIS — M25511 Pain in right shoulder: Secondary | ICD-10-CM | POA: Diagnosis not present

## 2014-03-09 DIAGNOSIS — M25552 Pain in left hip: Secondary | ICD-10-CM | POA: Diagnosis not present

## 2014-03-09 DIAGNOSIS — M461 Sacroiliitis, not elsewhere classified: Secondary | ICD-10-CM | POA: Diagnosis not present

## 2014-03-09 DIAGNOSIS — B0223 Postherpetic polyneuropathy: Secondary | ICD-10-CM | POA: Diagnosis not present

## 2014-03-09 DIAGNOSIS — M25512 Pain in left shoulder: Secondary | ICD-10-CM | POA: Diagnosis not present

## 2014-03-09 DIAGNOSIS — M797 Fibromyalgia: Secondary | ICD-10-CM | POA: Diagnosis not present

## 2014-03-09 DIAGNOSIS — M791 Myalgia: Secondary | ICD-10-CM | POA: Diagnosis not present

## 2014-03-09 DIAGNOSIS — M545 Low back pain: Secondary | ICD-10-CM | POA: Diagnosis not present

## 2014-03-11 ENCOUNTER — Telehealth: Payer: Self-pay | Admitting: Internal Medicine

## 2014-03-11 NOTE — Telephone Encounter (Signed)
Please make sure she has stopped the cholesterol medication.

## 2014-03-11 NOTE — Telephone Encounter (Signed)
Appt has been scheduled with Morey Hummingbird for tomorrow per triage nurse, Juluis Rainier

## 2014-03-11 NOTE — Telephone Encounter (Signed)
[facility] Patient Name: Maria Keller DOB: 1950/03/23 Initial Comment Caller states, saw the Dr on 2/2 for cholesterol Rx, she now has a lot of joint pains Nurse Assessment Nurse: Erlene Quan, RN, Manuela Schwartz Date/Time Eilene Ghazi Time): 03/11/2014 9:48:16 AM Confirm and document reason for call. If symptomatic, describe symptoms. ---Caller states, saw the Dr on 1/17 for cholesterol Rx - he put her on she now has a lot of joint pains , she has fibromylgia but her symptoms are usually muscular not joints like this is - she called Tuesday and left a message but no one called back - she in on pain patches and they are not helping with the pain - states she noticed symptoms about a week ago Has the patient traveled out of the country within the last 30 days? ---Not Applicable Does the patient require triage? ---Yes Related visit to physician within the last 2 weeks? ---No Does the PT have any chronic conditions? (i.e. diabetes, asthma, etc.) ---Yes Guidelines Guideline Title Affirmed Question Affirmed Notes Leg Pain [1] MODERATE pain (e.g., interferes with normal activities, limping) AND [2] present > 3 days Final Disposition User See PCP When Office is Open (within 3 days) Erlene Quan, Therapist, sports, Manuela Schwartz

## 2014-03-11 NOTE — Telephone Encounter (Signed)
Left message, notifying pt.  

## 2014-03-12 ENCOUNTER — Encounter: Payer: Self-pay | Admitting: Nurse Practitioner

## 2014-03-12 ENCOUNTER — Ambulatory Visit (INDEPENDENT_AMBULATORY_CARE_PROVIDER_SITE_OTHER): Payer: Medicare Other | Admitting: Nurse Practitioner

## 2014-03-12 VITALS — BP 116/70 | HR 71 | Temp 98.4°F | Resp 12 | Ht 67.5 in | Wt 184.4 lb

## 2014-03-12 DIAGNOSIS — M2550 Pain in unspecified joint: Secondary | ICD-10-CM | POA: Diagnosis not present

## 2014-03-12 NOTE — Progress Notes (Signed)
Pre visit review using our clinic review tool, if applicable. No additional management support is needed unless otherwise documented below in the visit note. 

## 2014-03-12 NOTE — Patient Instructions (Addendum)
Stay off pravastatin and follow up with Dr. Nicki Reaper at your next appointment.   Watch fats in your diet and keep up with good work with exercising.

## 2014-03-12 NOTE — Progress Notes (Signed)
   Subjective:    Patient ID: Maria Keller, female    DOB: 1950-11-08, 64 y.o.   MRN: [id number]  HPI  [name] is a 64 yo female with a CC of joint pain x 2 weeks.   1) Started pravastatin at 02/22/14 visit and started having achy joints all over a week later. Feels it in joints not muscles. Fingers, shoulders, and knees most painful.   Stopped yesterday night at request of Dr. Nicki Reaper.   Diet-Vegetables and chicken recently  Exercise- Walking on [place] 4-5 x a week, up to 15 min. A day. Pool for swimming in summer  Pt has not tolerated other cholesterol medications in the past.   Review of Systems  Constitutional: Negative for fever, chills, diaphoresis, fatigue and unexpected weight change.  Respiratory: Negative for chest tightness, shortness of breath and wheezing.   Cardiovascular: Negative for chest pain, palpitations and leg swelling.  Gastrointestinal: Negative for nausea, vomiting, diarrhea and rectal pain.  Musculoskeletal: Positive for myalgias and arthralgias.       All over joint pain, pt has fibromyalgia she is seeing Pain Management for.   Skin: Negative for rash.  Neurological: Negative for dizziness, weakness, numbness and headaches.  Psychiatric/Behavioral: The patient is not nervous/anxious.        Objective:   Physical Exam  Constitutional: She is oriented to person, place, and time. She appears well-developed and well-nourished. No distress.  BP 116/70 mmHg  Pulse 71  Temp(Src) 98.4 F (36.9 C) (Oral)  Resp 12  Ht 5' 7.5" (1.715 m)  Wt 184 lb 6.4 oz (83.643 kg)  BMI 28.44 kg/m2  SpO2 96%   HENT:  Head: Normocephalic and atraumatic.  Right Ear: External ear normal.  Left Ear: External ear normal.  Neurological: She is alert and oriented to person, place, and time. No cranial nerve deficit. She exhibits normal muscle tone. Coordination normal.  Skin: Skin is warm and dry. No rash noted. She is not diaphoretic.  Psychiatric: She has a normal mood  and affect. Her behavior is normal. Judgment and thought content normal.       Assessment & Plan:

## 2014-03-12 NOTE — Assessment & Plan Note (Signed)
Stable. Stopped Pravastatin last night. Will stay off of it indefinitely. Okay with Dr. Nicki Reaper to not go on anything in the meantime. Diet and exercise is encouraged. She was advised to cancel the lab draw visit for liver function since she has stopped the possible offender. FU with Dr. Nicki Reaper in May. Sooner if needed.

## 2014-03-24 DIAGNOSIS — H1840 Unspecified corneal degeneration: Secondary | ICD-10-CM | POA: Diagnosis not present

## 2014-03-31 DIAGNOSIS — G894 Chronic pain syndrome: Secondary | ICD-10-CM | POA: Diagnosis not present

## 2014-03-31 DIAGNOSIS — F321 Major depressive disorder, single episode, moderate: Secondary | ICD-10-CM | POA: Diagnosis not present

## 2014-04-08 ENCOUNTER — Other Ambulatory Visit: Payer: Medicare Other

## 2014-04-12 ENCOUNTER — Ambulatory Visit: Payer: Self-pay | Admitting: Pain Medicine

## 2014-04-12 DIAGNOSIS — C8290 Follicular lymphoma, unspecified, unspecified site: Secondary | ICD-10-CM | POA: Diagnosis not present

## 2014-04-12 DIAGNOSIS — K589 Irritable bowel syndrome without diarrhea: Secondary | ICD-10-CM | POA: Diagnosis not present

## 2014-04-12 DIAGNOSIS — M25512 Pain in left shoulder: Secondary | ICD-10-CM | POA: Diagnosis not present

## 2014-04-12 DIAGNOSIS — M797 Fibromyalgia: Secondary | ICD-10-CM | POA: Diagnosis not present

## 2014-04-12 DIAGNOSIS — M542 Cervicalgia: Secondary | ICD-10-CM | POA: Diagnosis not present

## 2014-04-12 DIAGNOSIS — B029 Zoster without complications: Secondary | ICD-10-CM | POA: Diagnosis not present

## 2014-04-12 DIAGNOSIS — M545 Low back pain: Secondary | ICD-10-CM | POA: Diagnosis not present

## 2014-04-12 DIAGNOSIS — R0782 Intercostal pain: Secondary | ICD-10-CM | POA: Diagnosis not present

## 2014-04-12 DIAGNOSIS — M25511 Pain in right shoulder: Secondary | ICD-10-CM | POA: Diagnosis not present

## 2014-05-04 DIAGNOSIS — M47817 Spondylosis without myelopathy or radiculopathy, lumbosacral region: Secondary | ICD-10-CM | POA: Diagnosis not present

## 2014-05-04 DIAGNOSIS — R0782 Intercostal pain: Secondary | ICD-10-CM | POA: Diagnosis not present

## 2014-05-04 DIAGNOSIS — M5416 Radiculopathy, lumbar region: Secondary | ICD-10-CM | POA: Diagnosis not present

## 2014-05-04 DIAGNOSIS — B0223 Postherpetic polyneuropathy: Secondary | ICD-10-CM | POA: Diagnosis not present

## 2014-05-05 ENCOUNTER — Ambulatory Visit: Admit: 2014-05-05 | Disposition: A | Discharge status: Home / Self Care | Payer: Self-pay | Admitting: Pain Medicine

## 2014-05-05 DIAGNOSIS — M797 Fibromyalgia: Secondary | ICD-10-CM | POA: Diagnosis not present

## 2014-05-05 DIAGNOSIS — M25512 Pain in left shoulder: Secondary | ICD-10-CM | POA: Diagnosis not present

## 2014-05-05 DIAGNOSIS — M25511 Pain in right shoulder: Secondary | ICD-10-CM | POA: Diagnosis not present

## 2014-05-05 DIAGNOSIS — M542 Cervicalgia: Secondary | ICD-10-CM | POA: Diagnosis not present

## 2014-05-05 DIAGNOSIS — M545 Low back pain: Secondary | ICD-10-CM | POA: Diagnosis not present

## 2014-05-05 DIAGNOSIS — K589 Irritable bowel syndrome without diarrhea: Secondary | ICD-10-CM | POA: Diagnosis not present

## 2014-05-11 DIAGNOSIS — C8290 Follicular lymphoma, unspecified, unspecified site: Secondary | ICD-10-CM | POA: Insufficient documentation

## 2014-05-11 DIAGNOSIS — C8590 Non-Hodgkin lymphoma, unspecified, unspecified site: Secondary | ICD-10-CM | POA: Insufficient documentation

## 2014-05-11 DIAGNOSIS — K589 Irritable bowel syndrome without diarrhea: Secondary | ICD-10-CM | POA: Insufficient documentation

## 2014-05-28 NOTE — Op Note (Signed)
PATIENT NAME:  Maria Keller, Maria Keller MR#:  [id number] DATE OF BIRTH:  Dec 19, 1950  DATE OF PROCEDURE:  11/10/2012  PREOPERATIVE DIAGNOSIS:  Pelvic organ prolapse (enterocele).    POSTOPERATIVE DIAGNOSIS:  Pelvic organ prolapse (enterocele).    OPERATIVE PROCEDURE: Enterocele ligation and adhesiolysis.   SURGEON: Brayton Mars, M.D.   FIRST ASSISTANT: Dr. Marcelline Mates and Joie Bimler PA-S.   ANESTHESIA: General endotracheal.   INDICATIONS: The patient is a 64 year old white female, with recurrent pelvic organ prolapse. She is status post transvaginal hysterectomy and MMK in the past. She also is status post posterior colporrhaphy with enterocele ligation approximately 18 months ago. She has recurrent symptoms of pelvic pressure and a readily identified enterocele noted on exam. She desires repeat surgical correction.   FINDINGS AT SURGERY: Reveals an enterocele being present. There also were some bowel posterior cuff adhesions that were partially taken down.   DESCRIPTION OF PROCEDURE: The patient was brought to the operating room where she was placed in the supine position. General endotracheal anesthesia was induce without difficulty. She was placed in the dorsal lithotomy position using the candycane stirrups. A Betadine perineal, intravaginal prep and drape was performed in the standard fashion. A Foley catheter was placed and the bladder was draining clear yellow urine. A clinical exam confirmed the findings. Allis clamps were placed on the lateral aspects of the introitus and a wedge portion of tissue was removed from the introitus involving the vaginal mucosa which was slightly scarred as well as the perineal skin. The vaginal mucosa was undermined with a Metzenbaum scissors and then incised in the midline. This process was carried out to the level of the vaginal apex. Geraldine Contras retractors were used to facilitate exposure. Once the depth of the vagina was incised and opened, the perirectal fascia  was dissected using sharp and blunt dissection techniques. The enterocele sac was identified and opened. The enterocele sac was explored and verified no significant anterior adhesions. Posteriorly, there was some small bowel attached to the posterior cuff and approximately 50% of the adhesions were lysed. Because of the extensive nature of the adhesions a certain portion of the bowel was not mobilized. The enterocele sac was then reduced using a pursestring suture of 0 Vicryl. A second pursestring stitch was also placed following the first in order to help reduce the enterocele. The vaginal mucosa was then trimmed and the vagina was reapproximated in the midline using simple interrupted sutures of 2-0 Vicryl. A nice reduction of the hernia was noted at the completion of the procedure. Vaginal packing was placed using Kerlix and Premarin cream. The procedure was then terminated with all instrumentation being removed and all instruments, needle, and sponge counts were verified as correct.   ESTIMATED BLOOD LOSS: 75 mL.   IV FLUIDS INFUSED: 1200 mL   URINE OUTPUT:  200 mL     ____________________________ Alanda Slim. Masiyah Engen, MD mad:dp D: 11/11/2012 15:13:16 ET T: 11/11/2012 16:06:51 ET JOB#: [id number]  cc: Hassell Done A. Seanna Sisler, MD, <Dictator> [facility] MD ELECTRONICALLY SIGNED 11/15/2012 14:37

## 2014-05-30 NOTE — H&P (Signed)
PATIENT NAME:  [name], [name] MR#:  [id number] DATE OF BIRTH:  23-Apr-1950  DATE OF ADMISSION:  07/23/2011  PREOPERATIVE DIAGNOSES:  1. Large rectocele.  2. Enterocele.   HISTORY: Maria Keller is a 64 year old married white female, para 2-0-0-2, status post transvaginal hysterectomy at age 64, menopausal, on Estrace 1 mg p.o. daily, who presents for surgical management of large rectocele and enterocele. Patient has chronic pelvic pressure symptoms, worsening recently, with history of incompletely evacuating stool with bowel movements, and need to splint during defecation. Patient also has mild stress urinary incontinence symptoms which does not require pad use. Patient has nocturia x7 for which she has been recently started on medication by Dr. Bernardo Heater. She is status MMK with Dr. Madelin Headings in the past. Patient desires definitive surgery.   PAST MEDICAL HISTORY:  1. Fibromyalgia.  2. Diverticulosis.  3. Irritable bowel syndrome.  4. Hiatal hernia.  5. Renal lithiasis, complicated by urosepsis following stent placement.  6. Urge incontinence.  7. Follicular lymphoma, stable.   PAST SURGICAL HISTORY:  1. Transvaginal hysterectomy age 72.  2. Laparoscopic USO ago 78s.  3. Laparoscopic cholecystectomy.  4. Open appendectomy.  5. Laparoscopy with biopsies which confirmed lymphoma in 2004.  6. Tibial fracture repair.  7. Ankle fracture repair.  8. Breast biopsy x2.  9. Colonoscopy.  10. EGD.  11. MMK.   PAST OB HISTORY: Para 2-0-0-2, spontaneous vaginal delivery x2 with largest being 9 pounds 1 ounce.   FAMILY HISTORY: Negative for cancer of the colon, ovary, or breast.   SOCIAL HISTORY: Patient does not smoke, does not drink, does not use drugs.   CURRENT MEDICATIONS:  1. Estradiol 1 mg a day. 2. Prilosec 20 mg b.i.d.  3. Fentanyl patch transdermal every 72 hours.  4. Zanaflex 2 mg q.6 hours p.r.n.  5. Lexapro 20 mg at bedtime.  6. Baby aspirin 1 a day.  7. Senokot-S p.r.n.  8. Lasix  20 mg daily.  9. TUMS.  10. Fish oil burpless daily.  11. Coenzyme Q-10 daily.   DRUG ALLERGIES: Chloral hydrate, OxyContin, penicillin, Bextra.   REVIEW OF SYSTEMS: Patient denies recent illness. She denies history of reactive airway disease. She denies history of coagulopathy.   PHYSICAL EXAMINATION:  VITAL SIGNS: Height 5 feet, 7 inches, weight 176.2, blood pressure 104/78, heart rate 80.   GENERAL: Patient is a pleasant elderly female in no acute distress.   HEENT: Oropharynx is clear.   NECK: Supple. There is no thyromegaly or adenopathy.   LUNGS: Clear.   HEART: Regular rate and rhythm without murmur.   ABDOMEN: Soft, nontender. No organomegaly.   PELVIC: External genitalia with mild atrophic changes. BUS normal. Vagina has fair estrogen effect. There is a large rectocele noted with widely splayed levators and a bulge at the apex of the vagina consistent with enterocele. There is a first-degree cystocele present. There is not significant rotational descent of the bladder neck.   RECTOVAGINAL: Rectovaginal exam revealed normal sphincter tone, widely splayed levators, without evidence of rectal mass.  EXTREMITIES: Without clubbing, cyanosis, or edema.   SKIN: Without rash.   MUSCULOSKELETAL: Normal.   NEUROLOGIC: Normal.   IMPRESSION:  1. Large rectocele.  2. Vaginal enterocele.   PLAN: Posterior colporrhaphy with enterocele ligation. Date of surgery 07/23/2011.    CONSENT NOTE: Maria Keller is to undergo surgery on 07/23/2011. She is understanding of the planned procedure and is aware of and is accepting of all surgical risks which include but are not limited to  bleeding, infection, pelvic organ injury with need for repair, blood clot disorders, anesthesia risks, and death. All questions are answered. Informed consent is given. Patient is ready and willing to proceed with surgery as scheduled.   ____________________________ Maria Slim Avonlea Sima,  MD mad:cms D: 07/19/2011 13:34:52 ET T: 07/19/2011 14:08:51 ET  JOB#: [id number] cc: Hassell Done A. Kleber Crean, MD, <Dictator>  Maria Slim Linlee Cromie MD ELECTRONICALLY SIGNED 07/24/2011 12:54

## 2014-05-30 NOTE — Op Note (Signed)
PATIENT NAME:  Maria Keller, Maria Keller MR#:  [id number] DATE OF BIRTH:  05-18-1950  DATE OF PROCEDURE:  07/23/2011  PREOPERATIVE DIAGNOSES:  1. Rectocele.  2. Enterocele.   POSTOPERATIVE DIAGNOSES:  1. Rectocele.  2. Enterocele.   OPERATIVE PROCEDURE: Posterior colporrhaphy with enterocele ligation.   SURGEON: Brayton Mars, M.D.  FIRST ASSISTANT: None.   ANESTHESIA: General.   INDICATIONS: The patient is a 64 year old white female status post hysterectomy in the past who presents for surgical management of symptomatic rectocele and enterocele.   FINDINGS: Findings at surgery revealed a high enterocele. The sac was opened and explored and no significant anterior adhesions were noted. There were some posterior small bowel adhesions which were untouched. The enterocele was reduced.   DESCRIPTION OF PROCEDURE: The patient was brought to the operating room where she was placed in the supine position. General anesthesia was induced without difficulty. She was placed in the dorsal lithotomy position using the candy-cane stirrups. A Betadine perineal, intravaginal prep and drape was performed in the standard fashion. A Foley catheter was placed and was draining clear yellow urine from the bladder. Two Allis clamps were placed onto the angles of the introitus at the hymenal ring. A triangular wedge-shaped superficial vaginal mucosa specimen was excised. Allis-Adair retractors were used to facilitate exposure. The vaginal mucosa was undermined in the midline with Metzenbaum scissors and incised. This was carried out up to the level of the vaginal apex. The perirectal fascia was dissected from the vagina through sharp and blunt dissection. The enterocele sac was identified and opened. Digital exploration revealed some posterior bowel adhesions only. The enterocele sac was then ligated using several pursestring sutures of 0 Vicryl. Further dissection was then performed to dissect out the perirectal fascia.  The posterior colporrhaphy was then performed using horizontal mattress sutures of 0 Vicryl. This provided a good levator shelf support overlying the rectum. The excess vaginal mucosa was trimmed and it was then reapproximated in the midline using 2-0 chromic suture in a simple interrupted manner. Upon completion of the procedure, the vaginal depth was noted to be adequate for two digits. Vaginal packing was placed using Kerlix with Premarin cream. The patient was then awakened, mobilized, and taken to the recovery room in satisfactory condition.  Estimated blood loss was  50 mL. IV fluids were 1000 mL. All instrument, needle, and sponge counts were verified as correct.     ____________________________ Alanda Slim. Eugune Sine, MD mad:bjt D: 07/23/2011 14:05:21 ET T: 07/23/2011 14:22:51 ET JOB#: [id number]  cc: Hassell Done A. Zayveon Raschke, MD, <Dictator> Alanda Slim Ruben Mahler MD ELECTRONICALLY SIGNED 07/24/2011 12:55

## 2014-06-07 ENCOUNTER — Encounter: Payer: Self-pay | Admitting: Pain Medicine

## 2014-06-07 ENCOUNTER — Ambulatory Visit: Payer: Medicare Other | Attending: Pain Medicine | Admitting: Pain Medicine

## 2014-06-07 VITALS — BP 110/48 | HR 62 | Temp 97.7°F | Resp 18 | Ht 67.5 in | Wt 175.0 lb

## 2014-06-07 DIAGNOSIS — M47817 Spondylosis without myelopathy or radiculopathy, lumbosacral region: Secondary | ICD-10-CM | POA: Diagnosis not present

## 2014-06-07 DIAGNOSIS — C8590 Non-Hodgkin lymphoma, unspecified, unspecified site: Secondary | ICD-10-CM | POA: Insufficient documentation

## 2014-06-07 DIAGNOSIS — G8929 Other chronic pain: Secondary | ICD-10-CM | POA: Insufficient documentation

## 2014-06-07 DIAGNOSIS — M5137 Other intervertebral disc degeneration, lumbosacral region: Secondary | ICD-10-CM | POA: Insufficient documentation

## 2014-06-07 DIAGNOSIS — B0229 Other postherpetic nervous system involvement: Secondary | ICD-10-CM | POA: Insufficient documentation

## 2014-06-07 DIAGNOSIS — M5416 Radiculopathy, lumbar region: Secondary | ICD-10-CM | POA: Diagnosis not present

## 2014-06-07 DIAGNOSIS — B0223 Postherpetic polyneuropathy: Secondary | ICD-10-CM | POA: Diagnosis not present

## 2014-06-07 DIAGNOSIS — M545 Low back pain, unspecified: Secondary | ICD-10-CM | POA: Insufficient documentation

## 2014-06-07 DIAGNOSIS — M533 Sacrococcygeal disorders, not elsewhere classified: Secondary | ICD-10-CM

## 2014-06-07 DIAGNOSIS — R0782 Intercostal pain: Secondary | ICD-10-CM | POA: Diagnosis not present

## 2014-06-07 DIAGNOSIS — M5134 Other intervertebral disc degeneration, thoracic region: Secondary | ICD-10-CM | POA: Diagnosis not present

## 2014-06-07 DIAGNOSIS — M797 Fibromyalgia: Secondary | ICD-10-CM | POA: Diagnosis not present

## 2014-06-07 DIAGNOSIS — M79604 Pain in right leg: Secondary | ICD-10-CM | POA: Insufficient documentation

## 2014-06-07 MED ORDER — FENTANYL 25 MCG/HR TD PT72: 25.0000 ug | MEDICATED_PATCH | TRANSDERMAL | Status: DC

## 2014-06-07 MED ORDER — FENTANYL 100 MCG/HR TD PT72: 100.0000 ug | MEDICATED_PATCH | TRANSDERMAL | Status: DC

## 2014-06-07 NOTE — Progress Notes (Signed)
   Subjective:    Patient ID: Maria Keller, female    DOB: 10/26/1950, 64 y.o.   MRN: [id number]  HPI  Low back pain  Lower extremity pain  Thoracic pain  due to shingles   Review of Systems     Objective:   Physical Exam Pain  With  palpation of thoracic region, lumbar  And sacral regions          Assessment & Plan:  Post herpetic neuralgia  Sacroiliac  joint dysfunction DDD OF SPINE  FIBROMYALGIA LYMPHOMA

## 2014-06-14 NOTE — Addendum Note (Signed)
Addended by: Morley Kos on: 06/14/2014 05:11 PM   Modules accepted: Level of Service

## 2014-06-17 NOTE — Addendum Note (Signed)
Addended by: Dewayne Shorter on: 06/17/2014 10:45 AM   Modules accepted: Orders

## 2014-06-23 ENCOUNTER — Other Ambulatory Visit: Payer: Self-pay | Admitting: Internal Medicine

## 2014-06-23 ENCOUNTER — Encounter: Payer: Self-pay | Admitting: Internal Medicine

## 2014-06-23 ENCOUNTER — Ambulatory Visit (INDEPENDENT_AMBULATORY_CARE_PROVIDER_SITE_OTHER): Payer: Medicare Other | Admitting: Internal Medicine

## 2014-06-23 VITALS — BP 106/60 | HR 80 | Temp 98.8°F | Ht 67.5 in | Wt 186.4 lb

## 2014-06-23 DIAGNOSIS — K589 Irritable bowel syndrome without diarrhea: Secondary | ICD-10-CM | POA: Diagnosis not present

## 2014-06-23 DIAGNOSIS — C8590 Non-Hodgkin lymphoma, unspecified, unspecified site: Secondary | ICD-10-CM | POA: Diagnosis not present

## 2014-06-23 DIAGNOSIS — E7800 Pure hypercholesterolemia, unspecified: Secondary | ICD-10-CM | POA: Insufficient documentation

## 2014-06-23 DIAGNOSIS — K219 Gastro-esophageal reflux disease without esophagitis: Secondary | ICD-10-CM | POA: Diagnosis not present

## 2014-06-23 DIAGNOSIS — R7989 Other specified abnormal findings of blood chemistry: Secondary | ICD-10-CM

## 2014-06-23 DIAGNOSIS — D649 Anemia, unspecified: Secondary | ICD-10-CM

## 2014-06-23 DIAGNOSIS — R945 Abnormal results of liver function studies: Secondary | ICD-10-CM

## 2014-06-23 DIAGNOSIS — Z0000 Encounter for general adult medical examination without abnormal findings: Secondary | ICD-10-CM

## 2014-06-23 DIAGNOSIS — M797 Fibromyalgia: Secondary | ICD-10-CM

## 2014-06-23 DIAGNOSIS — G8929 Other chronic pain: Secondary | ICD-10-CM

## 2014-06-23 DIAGNOSIS — M549 Dorsalgia, unspecified: Secondary | ICD-10-CM

## 2014-06-23 LAB — COMPREHENSIVE METABOLIC PANEL
ALBUMIN: 4.2 g/dL (ref 3.5–5.2)
ALK PHOS: 76 U/L (ref 39–117)
ALT: 41 U/L — ABNORMAL HIGH (ref 0–35)
AST: 44 U/L — ABNORMAL HIGH (ref 0–37)
BUN: 10 mg/dL (ref 6–23)
CO2: 35 mEq/L — ABNORMAL HIGH (ref 19–32)
CREATININE: 0.85 mg/dL (ref 0.40–1.20)
Calcium: 9.7 mg/dL (ref 8.4–10.5)
Chloride: 99 mEq/L (ref 96–112)
GFR: 71.22 mL/min (ref >60.00)
Glucose, Bld: 101 mg/dL — ABNORMAL HIGH (ref 70–99)
Potassium: 4.3 mEq/L (ref 3.5–5.1)
SODIUM: 139 meq/L (ref 135–145)
TOTAL PROTEIN: 7.1 g/dL (ref 6.0–8.3)
Total Bilirubin: 0.5 mg/dL (ref 0.2–1.2)

## 2014-06-23 LAB — LIPID PANEL
CHOL/HDL RATIO: 5
Cholesterol: 236 mg/dL — ABNORMAL HIGH (ref 0–200)
HDL: 44.3 mg/dL (ref >39.00)
LDL CALC: 154 mg/dL — AB (ref 0–99)
NonHDL: 191.7
Triglycerides: 190 mg/dL — ABNORMAL HIGH (ref 0.0–149.0)
VLDL: 38 mg/dL (ref 0.0–40.0)

## 2014-06-23 MED ORDER — OMEPRAZOLE 20 MG PO CPDR: 20.0000 mg | DELAYED_RELEASE_CAPSULE | Freq: Two times a day (BID) | ORAL | Status: DC

## 2014-06-23 MED ORDER — VALACYCLOVIR HCL 500 MG PO TABS: 500.0000 mg | ORAL_TABLET | Freq: Every day | ORAL | Status: DC

## 2014-06-23 MED ORDER — FLUTICASONE PROPIONATE 50 MCG/ACT NA SUSP: 2.0000 | Freq: Every day | NASAL | Status: DC

## 2014-06-23 NOTE — Progress Notes (Signed)
Pre visit review using our clinic review tool, if applicable. No additional management support is needed unless otherwise documented below in the visit note. 

## 2014-06-23 NOTE — Progress Notes (Signed)
Order placed for f/u liver panel.  

## 2014-06-23 NOTE — Progress Notes (Signed)
Patient ID: Maria Keller, female   DOB: [birth year], 64 y.o.   MRN: [id number]   Subjective:    Patient ID: Maria Keller, female    DOB: Jun 01, 1950, 64 y.o.   MRN: [id number]  HPI  Patient here for a scheduled follow up.  She still has pain under her left rib.  Seeing pain clinic.  Is s/p injections.  Stable.  She has started walking.  No cardiac symptoms with increased activity or exertion.  Breathing stable.  Wants to stop gabapentin.  Feels making her too sleepy.  Eating and drinking well.  Drinking prune juice, eating raisen bran and taking stool softener.  Helping bowels.     Past Medical History  Diagnosis Date  . Follicular lymphoma     Followed by Dr Leretha Pol, s/p chemo and XRT  . Anemia     iron deficiency and B12 deficiency  . Hypercholesterolemia   . Shingles outbreak 11/26/2011  . IBS (irritable bowel syndrome)   . Chronic back pain     followed by Dr Primus Bravo  . Nephrolithiasis     followed by Dr Bernardo Heater, s/p stents  . Esophageal stricture     requiring dilatation x 2  . Hiatal hernia   . Depression   . Fibromyalgia   . GERD (gastroesophageal reflux disease)   . IBS (irritable bowel syndrome)   . Follicular lymphoma   . Fibromyalgia     Current Outpatient Prescriptions on File Prior to Visit  Medication Sig Dispense Refill  . aspirin EC 81 MG tablet Take by mouth.    . calcium carbonate (TUMS - DOSED IN MG ELEMENTAL CALCIUM) 500 MG chewable tablet Chew 1 tablet by mouth as needed.    . cetirizine (ZYRTEC) 10 MG tablet Take by mouth.    . conjugated estrogens (PREMARIN) vaginal cream Place vaginally.    . Diphenhyd-Hydrocort-Nystatin (FIRST-DUKES MOUTHWASH) SUSP Swish and spit 5 mLs every 6 (six) hours as needed for Pain.    Marland Kitchen escitalopram (LEXAPRO) 20 MG tablet Take by mouth.    . fentaNYL (DURAGESIC) 100 MCG/HR Place 1 patch (100 mcg total) onto the skin 2 days. 15 patch 0  . fentaNYL (DURAGESIC) 25 MCG/HR patch Place 1 patch (25 mcg total) onto the skin 2 days.  15 patch 0  . Ferrous Fumarate 324 MG TABS Take 324 mg by mouth daily.    . furosemide (LASIX) 20 MG tablet Take 1 tablet (20 mg total) by mouth daily as needed. 90 tablet 1  . Omega-3 Fatty Acids (FISH OIL) 1360 MG CAPS Take 1 capsule by mouth daily.    Marland Kitchen senna-docusate (SENOKOT-S) 8.6-50 MG per tablet Take by mouth.    . sennosides-docusate sodium (SENOKOT-S) 8.6-50 MG tablet Take 1 tablet by mouth as needed.     No current facility-administered medications on file prior to visit.    Review of Systems  Constitutional: Negative for appetite change and unexpected weight change.  HENT: Negative for congestion and sinus pressure.   Respiratory: Negative for cough, chest tightness and shortness of breath.   Cardiovascular: Negative for chest pain, palpitations and leg swelling.  Gastrointestinal: Negative for nausea, vomiting, abdominal pain and diarrhea.  Musculoskeletal: Positive for back pain (chronic.  continued pain under left breast. ).  Skin: Negative for color change and rash.  Neurological: Negative for dizziness, light-headedness and headaches.  Hematological: Negative for adenopathy. Does not bruise/bleed easily.  Psychiatric/Behavioral: Negative for dysphoric mood and agitation.       Objective:  Blood pressure recheck:  110/78  Physical Exam  Constitutional: She appears well-developed and well-nourished. No distress.  HENT:  Nose: Nose normal.  Mouth/Throat: Oropharynx is clear and moist.  Neck: Neck supple. No thyromegaly present.  Cardiovascular: Normal rate and regular rhythm.   Pulmonary/Chest: Breath sounds normal. No respiratory distress. She has no wheezes.  Abdominal: Soft. Bowel sounds are normal. There is no tenderness.  Musculoskeletal: She exhibits no edema or tenderness.  Lymphadenopathy:    She has no cervical adenopathy.  Skin: No rash noted. No erythema.  Psychiatric: She has a normal mood and affect. Her behavior is normal.    BP 106/60 mmHg   Pulse 80  Temp(Src) 98.8 F (37.1 C) (Oral)  Ht 5' 7.5" (1.715 m)  Wt 186 lb 6 oz (84.539 kg)  BMI 28.74 kg/m2  SpO2 93% Wt Readings from Last 3 Encounters:  06/23/14 186 lb 6 oz (84.539 kg)  06/07/14 175 lb (79.379 kg)  03/12/14 184 lb 6.4 oz (83.643 kg)     Lab Results  Component Value Date   WBC 8.9 10/21/2013   HGB 12.3 10/21/2013   HCT 37.7 10/21/2013   PLT 212.0 10/21/2013   GLUCOSE 101* 06/23/2014   CHOL 236* 06/23/2014   TRIG 190.0* 06/23/2014   HDL 44.30 06/23/2014   LDLDIRECT 164.2 02/11/2013   LDLCALC 154* 06/23/2014   ALT 41* 06/23/2014   AST 44* 06/23/2014   NA 139 06/23/2014   K 4.3 06/23/2014   CL 99 06/23/2014   CREATININE 0.85 06/23/2014   BUN 10 06/23/2014   CO2 35* 06/23/2014   TSH 1.08 02/22/2014       Assessment & Plan:   Problem List Items Addressed This Visit    Anemia    EGD and colonoscopy as outlined in overview.  Follow cbc.       Chronic back pain    Followed by Dr Primus Bravo.       Fibromyalgia    Stable.       GERD (gastroesophageal reflux disease)    On omeprazole.  Symptoms controlled.       Relevant Medications   omeprazole (PRILOSEC) 20 MG capsule   Health care maintenance    Physical 02/22/14.  S/p hysterectomy.  Mammogram 11/11/13 - ok.  Colonoscopy 03/10/13 - melanosis and internal hemorrhoids.        Hypercholesterolemia    Low cholesterol diet and exercise.  Off cholesterol medication.  Follow lipid panel.        Relevant Orders   Lipid panel (Completed)   IBS (irritable bowel syndrome)    Bowels stable.       Relevant Medications   omeprazole (PRILOSEC) 20 MG capsule   Lymphoma - Primary    Being followed by Dr Leretha Pol.  Stable.       Relevant Orders   Comprehensive metabolic panel (Completed)     I spent 25 minutes with the patient and more than 50% of the time was spent in consultation regarding the above.     Einar Pheasant, MD

## 2014-06-24 ENCOUNTER — Encounter: Payer: Self-pay | Admitting: *Deleted

## 2014-06-24 MED ORDER — AZELASTINE HCL 0.1 % NA SOLN: 1.0000 | Freq: Two times a day (BID) | NASAL | Status: DC

## 2014-06-30 ENCOUNTER — Other Ambulatory Visit: Payer: Self-pay | Admitting: Pain Medicine

## 2014-07-04 ENCOUNTER — Encounter: Payer: Self-pay | Admitting: Internal Medicine

## 2014-07-04 DIAGNOSIS — Z0000 Encounter for general adult medical examination without abnormal findings: Secondary | ICD-10-CM | POA: Insufficient documentation

## 2014-07-04 NOTE — Assessment & Plan Note (Signed)
Bowels stable.  

## 2014-07-04 NOTE — Assessment & Plan Note (Signed)
EGD and colonoscopy as outlined in overview.  Follow cbc.

## 2014-07-04 NOTE — Assessment & Plan Note (Signed)
On omeprazole.  Symptoms controlled.  

## 2014-07-04 NOTE — Assessment & Plan Note (Signed)
Being followed by Dr Leretha Pol.  Stable.

## 2014-07-04 NOTE — Assessment & Plan Note (Signed)
Stable

## 2014-07-04 NOTE — Assessment & Plan Note (Signed)
Followed by Dr Primus Bravo.

## 2014-07-04 NOTE — Assessment & Plan Note (Signed)
Physical 02/22/14.  S/p hysterectomy.  Mammogram 11/11/13 - ok.  Colonoscopy 03/10/13 - melanosis and internal hemorrhoids.

## 2014-07-04 NOTE — Assessment & Plan Note (Signed)
Low cholesterol diet and exercise.  Off cholesterol medication.  Follow lipid panel.   

## 2014-07-08 ENCOUNTER — Other Ambulatory Visit: Payer: Medicare Other

## 2014-07-08 ENCOUNTER — Encounter: Payer: Medicare Other | Admitting: Pain Medicine

## 2014-07-13 ENCOUNTER — Encounter: Payer: Self-pay | Admitting: Pain Medicine

## 2014-07-13 ENCOUNTER — Ambulatory Visit: Payer: Medicare Other | Attending: Pain Medicine | Admitting: Pain Medicine

## 2014-07-13 VITALS — BP 128/51 | HR 61 | Temp 98.3°F | Resp 16 | Ht 67.0 in | Wt 175.0 lb

## 2014-07-13 DIAGNOSIS — C8590 Non-Hodgkin lymphoma, unspecified, unspecified site: Secondary | ICD-10-CM | POA: Insufficient documentation

## 2014-07-13 DIAGNOSIS — M545 Pain in left leg: Secondary | ICD-10-CM

## 2014-07-13 DIAGNOSIS — M5136 Other intervertebral disc degeneration, lumbar region: Secondary | ICD-10-CM | POA: Diagnosis not present

## 2014-07-13 DIAGNOSIS — M5416 Radiculopathy, lumbar region: Secondary | ICD-10-CM | POA: Diagnosis not present

## 2014-07-13 DIAGNOSIS — B0229 Other postherpetic nervous system involvement: Secondary | ICD-10-CM | POA: Diagnosis not present

## 2014-07-13 DIAGNOSIS — M79604 Pain in right leg: Secondary | ICD-10-CM | POA: Diagnosis present

## 2014-07-13 DIAGNOSIS — M533 Sacrococcygeal disorders, not elsewhere classified: Secondary | ICD-10-CM | POA: Diagnosis not present

## 2014-07-13 DIAGNOSIS — M51369 Other intervertebral disc degeneration, lumbar region without mention of lumbar back pain or lower extremity pain: Secondary | ICD-10-CM

## 2014-07-13 DIAGNOSIS — M7060 Trochanteric bursitis, unspecified hip: Secondary | ICD-10-CM | POA: Insufficient documentation

## 2014-07-13 DIAGNOSIS — M47816 Spondylosis without myelopathy or radiculopathy, lumbar region: Secondary | ICD-10-CM

## 2014-07-13 DIAGNOSIS — M79605 Pain in left leg: Secondary | ICD-10-CM | POA: Diagnosis present

## 2014-07-13 DIAGNOSIS — B0223 Postherpetic polyneuropathy: Secondary | ICD-10-CM | POA: Diagnosis not present

## 2014-07-13 DIAGNOSIS — M47817 Spondylosis without myelopathy or radiculopathy, lumbosacral region: Secondary | ICD-10-CM | POA: Diagnosis not present

## 2014-07-13 DIAGNOSIS — R0782 Intercostal pain: Secondary | ICD-10-CM | POA: Diagnosis not present

## 2014-07-13 DIAGNOSIS — M546 Pain in thoracic spine: Secondary | ICD-10-CM | POA: Diagnosis present

## 2014-07-13 MED ORDER — FENTANYL 100 MCG/HR TD PT72: MEDICATED_PATCH | TRANSDERMAL | Status: DC

## 2014-07-13 MED ORDER — FENTANYL 25 MCG/HR TD PT72: MEDICATED_PATCH | TRANSDERMAL | Status: DC

## 2014-07-13 NOTE — Patient Instructions (Signed)
Continue present medications.  F/U PCP for evaliation of  BP and general medical  condition.  F/U surgical evaluation.  F/U neurological evaluation.  May consider radiofrequency rhizolysis or intraspinal procedures pending response to present treatment and F/U evaluation.  Patient to call [facility] should patient have concerns prior to scheduled return appointment.  

## 2014-07-13 NOTE — Progress Notes (Signed)
Patient discharge home ambulatory @0956  hrs Teach back three done Scripts given  Return in one month

## 2014-07-13 NOTE — Progress Notes (Signed)
Safety precautions to be maintained throughout the outpatient stay will include: orient to surroundings, keep bed in low position, maintain call bell within reach at all times, provide assistance with transfer out of bed and ambulation.  

## 2014-07-13 NOTE — Progress Notes (Signed)
   Subjective:    Patient ID: Maria Keller, female    DOB: 1950/07/19, 64 y.o.   MRN: [id number]  HPI  Patient is 64 year old female returns to [place] for further evaluation and treatment of pain involving the lower extremity region with mid back pain is well with prior history of shingles of the thoracic region. On today's visit patient states she is doing very well and is using fentanyl patches without the need for oxycodone. We will avoid interventional treatment and continue present medications as discussed patient was understanding and agreement with suggested treatment plan.   Review of Systems     Objective:   Physical Exam  Mild tinnitus of the splenius capitis and occipitalis regions. Minimal tenderness of the acromioclavicular glenohumeral joint region. Bilaterally equal grip strength with Tinel and Phalen's maneuver without increase of pain with Tinel and Phalen's maneuver. There was minimal tennis over the thoracic facet thoracic paraspinal musculature region with no excessive tends to palpation and without evidence of allodynia. The previous area of shingles was without evidence of allodynia or significant increase of pain or increased sensitivity to touch. Palpation over the lumbar paraspinal musculature region lumbar facet region associated with mild to moderate discomfort with lateral bending and rotation and extension and palpation of the lumbar facets reproducing mild to moderate discomfort. Straight leg raising was tolerates 30 without increased pain with dorsiflexion noted. Tenderness of the greater trochanteric region iliotibial band region. Abdomen nontender no costovertebral angle tenderness noted.      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  F/U PCP for evaliation of  BP and general medical  Conditio . F/U [facility] oncology  F/U surgical evaluation.  F/U neurological evaluation.  May consider radiofrequency rhizolysis or intraspinal procedures pending response to present treatment and F/U evaluation.  Patient to call [facility] should patient have concerns prior to scheduled return appointment.

## 2014-07-15 ENCOUNTER — Other Ambulatory Visit (INDEPENDENT_AMBULATORY_CARE_PROVIDER_SITE_OTHER): Payer: Medicare Other

## 2014-07-15 DIAGNOSIS — R945 Abnormal results of liver function studies: Secondary | ICD-10-CM

## 2014-07-15 DIAGNOSIS — R7989 Other specified abnormal findings of blood chemistry: Secondary | ICD-10-CM

## 2014-07-15 LAB — HEPATIC FUNCTION PANEL
ALK PHOS: 72 U/L (ref 39–117)
ALT: 33 U/L (ref 0–35)
AST: 36 U/L (ref 0–37)
Albumin: 4.2 g/dL (ref 3.5–5.2)
Bilirubin, Direct: 0 mg/dL (ref 0.0–0.3)
TOTAL PROTEIN: 7 g/dL (ref 6.0–8.3)
Total Bilirubin: 0.5 mg/dL (ref 0.2–1.2)

## 2014-07-16 ENCOUNTER — Encounter: Payer: Self-pay | Admitting: *Deleted

## 2014-07-28 DIAGNOSIS — F321 Major depressive disorder, single episode, moderate: Secondary | ICD-10-CM | POA: Diagnosis not present

## 2014-07-28 DIAGNOSIS — G894 Chronic pain syndrome: Secondary | ICD-10-CM | POA: Diagnosis not present

## 2014-08-10 ENCOUNTER — Ambulatory Visit: Payer: Medicare Other | Attending: Pain Medicine | Admitting: Pain Medicine

## 2014-08-10 VITALS — BP 109/58 | HR 71 | Temp 98.6°F | Resp 16 | Ht 67.0 in | Wt 175.0 lb

## 2014-08-10 DIAGNOSIS — M5136 Other intervertebral disc degeneration, lumbar region: Secondary | ICD-10-CM | POA: Insufficient documentation

## 2014-08-10 DIAGNOSIS — B0229 Other postherpetic nervous system involvement: Secondary | ICD-10-CM

## 2014-08-10 DIAGNOSIS — B0223 Postherpetic polyneuropathy: Secondary | ICD-10-CM | POA: Diagnosis not present

## 2014-08-10 DIAGNOSIS — M545 Low back pain, unspecified: Secondary | ICD-10-CM

## 2014-08-10 DIAGNOSIS — M51369 Other intervertebral disc degeneration, lumbar region without mention of lumbar back pain or lower extremity pain: Secondary | ICD-10-CM

## 2014-08-10 DIAGNOSIS — M533 Sacrococcygeal disorders, not elsewhere classified: Secondary | ICD-10-CM | POA: Diagnosis not present

## 2014-08-10 DIAGNOSIS — M7060 Trochanteric bursitis, unspecified hip: Secondary | ICD-10-CM | POA: Diagnosis not present

## 2014-08-10 DIAGNOSIS — M47816 Spondylosis without myelopathy or radiculopathy, lumbar region: Secondary | ICD-10-CM

## 2014-08-10 DIAGNOSIS — M79605 Pain in left leg: Secondary | ICD-10-CM | POA: Diagnosis present

## 2014-08-10 DIAGNOSIS — M5416 Radiculopathy, lumbar region: Secondary | ICD-10-CM | POA: Diagnosis not present

## 2014-08-10 DIAGNOSIS — R0782 Intercostal pain: Secondary | ICD-10-CM | POA: Diagnosis not present

## 2014-08-10 DIAGNOSIS — M47817 Spondylosis without myelopathy or radiculopathy, lumbosacral region: Secondary | ICD-10-CM | POA: Diagnosis not present

## 2014-08-10 DIAGNOSIS — G8929 Other chronic pain: Secondary | ICD-10-CM

## 2014-08-10 DIAGNOSIS — M79604 Pain in right leg: Secondary | ICD-10-CM | POA: Diagnosis present

## 2014-08-10 MED ORDER — FENTANYL 25 MCG/HR TD PT72: MEDICATED_PATCH | TRANSDERMAL | Status: DC

## 2014-08-10 MED ORDER — FENTANYL 100 MCG/HR TD PT72: MEDICATED_PATCH | TRANSDERMAL | Status: DC

## 2014-08-10 MED ORDER — OXYCODONE HCL 5 MG PO TABS: ORAL_TABLET | ORAL | Status: DC

## 2014-08-10 NOTE — Patient Instructions (Addendum)
Continue present medications Zanaflex oxycodone and fentanyl patches  F/U PCP Dr. Ronda Fairly for evaliation of  BP and general medical  condition.  F/U surgical evaluation  F/U neurological evaluation  May consider radiofrequency rhizolysis or intraspinal procedures pending response to present treatment and F/U evaluation.  Patient to call [facility] should patient have concerns prior to scheduled return appointment.

## 2014-08-10 NOTE — Progress Notes (Signed)
   Subjective:    Patient ID: Maria Keller, female    DOB: [birth year], 64 y.o.   MRN: [id number]  HPI  Patient is 64 year old female who [facility] for follow-up evaluation and treatment of pain involving the mid lower back and lower extremity regions. Patient states that the pain is well controlled at this time. We will continue present medications and avoid interventional treatment. Patient states that her shingles of the thoracic region is well controlled and that her bursitis also has been without significant interference of her sleep or ability to perform activities of daily living.    Review of Systems     Objective:   Physical Exam  There was tends to palpation of the splenius capitis and occipitalis musculature regions of minimal degree. There was unremarkable Spurling's maneuver. Patient was with bilaterally equal grip strength. Tinel and Phalen's maneuver were without increase of pain of significant degree. Palpation over the thoracic facet thoracic paraspinal musculature region was a tends to palpation of minimal degree. There was no allodynia of the thoracic region noted. Palpation over the cervical facet cervical paraspinal musculature region was without significantly increased pain. Palpation over the lumbar paraspinal muscles lumbar facet region was with minimal tenderness to palpation. Palpation over the greater trochanteric region iliotibial band region was tenderness to palpation of minimal degree. There was straight leg raising tolerates approximately 30 without increased pain with dorsiflexion There was negative clonus negative Homans abdomen nontender and no costovertebral angle tenderness was noted     Assessment & Plan:    Degenerative disc disease lumbar spine L4-L5 level degenerative changes most significant with multilevel degenerative changes noted throughout the lumbar spine  Postherpetic neuralgia thoracic region  Sacroiliac joint  dysfunction  Greater trochanteric bursitis    Plan  Continue present medications Zanaflex  oxycodone and fentanyl patches  F/U PCP for evaliation of  BP and general medical  condition.  F/U surgical evaluation  F/U neurological evaluation  May consider radiofrequency rhizolysis or intraspinal procedures pending response to present treatment and F/U evaluation.  Patient to call [facility] Management Center should patient have concerns prior to scheduled return appointment.

## 2014-08-10 NOTE — Progress Notes (Signed)
Safety precautions to be maintained throughout the outpatient stay will include: orient to surroundings, keep bed in low position, maintain call bell within reach at all times, provide assistance with transfer out of bed and ambulation.  Discharge patient home ambulatory at 0926 hrs. Scripts given for fentanyl 45mcg and 126mcg patches and oxycodone 5mg  Return next month for appointment

## 2014-08-25 DIAGNOSIS — C8238 Follicular lymphoma grade IIIa, lymph nodes of multiple sites: Secondary | ICD-10-CM | POA: Diagnosis not present

## 2014-09-07 ENCOUNTER — Encounter: Payer: Medicare Other | Admitting: Pain Medicine

## 2014-09-13 ENCOUNTER — Encounter: Payer: Self-pay | Admitting: Pain Medicine

## 2014-09-13 ENCOUNTER — Ambulatory Visit: Payer: Medicare Other | Attending: Pain Medicine | Admitting: Pain Medicine

## 2014-09-13 VITALS — BP 108/57 | HR 69 | Temp 98.1°F | Resp 16 | Ht 67.5 in | Wt 175.0 lb

## 2014-09-13 DIAGNOSIS — M7060 Trochanteric bursitis, unspecified hip: Secondary | ICD-10-CM | POA: Insufficient documentation

## 2014-09-13 DIAGNOSIS — M533 Sacrococcygeal disorders, not elsewhere classified: Secondary | ICD-10-CM | POA: Diagnosis not present

## 2014-09-13 DIAGNOSIS — R0782 Intercostal pain: Secondary | ICD-10-CM | POA: Diagnosis not present

## 2014-09-13 DIAGNOSIS — M546 Pain in thoracic spine: Secondary | ICD-10-CM | POA: Diagnosis present

## 2014-09-13 DIAGNOSIS — M47817 Spondylosis without myelopathy or radiculopathy, lumbosacral region: Secondary | ICD-10-CM | POA: Diagnosis not present

## 2014-09-13 DIAGNOSIS — M79604 Pain in right leg: Secondary | ICD-10-CM

## 2014-09-13 DIAGNOSIS — B0229 Other postherpetic nervous system involvement: Secondary | ICD-10-CM | POA: Insufficient documentation

## 2014-09-13 DIAGNOSIS — M5416 Radiculopathy, lumbar region: Secondary | ICD-10-CM | POA: Diagnosis not present

## 2014-09-13 DIAGNOSIS — M47816 Spondylosis without myelopathy or radiculopathy, lumbar region: Secondary | ICD-10-CM | POA: Diagnosis not present

## 2014-09-13 DIAGNOSIS — B0223 Postherpetic polyneuropathy: Secondary | ICD-10-CM | POA: Diagnosis not present

## 2014-09-13 DIAGNOSIS — M5136 Other intervertebral disc degeneration, lumbar region: Secondary | ICD-10-CM | POA: Insufficient documentation

## 2014-09-13 DIAGNOSIS — M542 Cervicalgia: Secondary | ICD-10-CM | POA: Diagnosis present

## 2014-09-13 DIAGNOSIS — G8929 Other chronic pain: Secondary | ICD-10-CM

## 2014-09-13 DIAGNOSIS — M545 Low back pain: Secondary | ICD-10-CM

## 2014-09-13 MED ORDER — FENTANYL 25 MCG/HR TD PT72: MEDICATED_PATCH | TRANSDERMAL | Status: DC

## 2014-09-13 MED ORDER — FENTANYL 100 MCG/HR TD PT72: MEDICATED_PATCH | TRANSDERMAL | Status: DC

## 2014-09-13 NOTE — Patient Instructions (Addendum)
Continue present medication fentanyl patches and oxycodone   F/U PCP Dr. Einar Pheasant for evaliation of  BP and general medical  condition  F/U surgical evaluation  F/U neurological evaluation  May consider radiofrequency rhizolysis or intraspinal procedures pending response to present treatment and F/U evaluation   Patient to call [facility] should patient have concerns prior to scheduled return appointment.   A prescription for DURAGESIC was given to you today.

## 2014-09-13 NOTE — Progress Notes (Signed)
Safety precautions to be maintained throughout the outpatient stay will include: orient to surroundings, keep bed in low position, maintain call bell within reach at all times, provide assistance with transfer out of bed and ambulation.  Discharged ambulatory at 8:20 am

## 2014-09-13 NOTE — Progress Notes (Signed)
   Subjective:    Patient ID: Maria Keller, female    DOB: 1950-12-24, 64 y.o.   MRN: [id number]  HPI  Patient is 64 year old female returns to [place] for further evaluation and treatment of pain involving neck and entire back upper and lower extremity regions. Patient states the pain is fairly well-controlled at this time. Patient is with some pain involving the lower back lower extremity region and region of the greater trochanteric regions which appear to be fairly well controlled at the present treatment regimen. We will continue patient's present medications and remain available to consider for interventional treatment as well as other modification of treatment regimen should there be a significant change in patient's condition. The patient was understanding and was in agreement with suggested treatment plan.    Review of Systems     Objective:   Physical Exam  There was mild tenderness of the splenius capitis and occipitalis musculature region. There was mild tenderness of the acromioclavicular glenohumeral joint region. Patient appeared to be with bilaterally equal grip strength. Tinel and Phalen's maneuver were without increase of pain of significant degree. Palpation over the region of the thoracic facet thoracic paraspinal musculature region cervical facet cervical paraspinal musculature region reproduced mild discomfort. There was tenderness over the lower thoracic paraspinal musculature region with evidence of muscle spasms of moderate degree. There was tenderness over the region of the lumbar paraspinal musculature region lumbar facet region of mild to moderate degree. Lateral bending and rotation and extension and palpation of the lumbar facets reproduce moderate discomfort. Straight leg raising was tolerated to 30 without increased pain dorsiflexion noted. There was negative clonus negative Homans. Palpation over the PSIS and PII S regions reproduced moderate  discomfort. Palpation of the greater trochanteric region iliotibial band region reproduces moderate discomfort. No definite sensory deficit of dermatomal distribution was detected. There was negative clonus and negative Homans. Abdomen was nontender with no costovertebral angle tenderness noted      Assessment & Plan:   Degenerative disc disease lumbar spine L4-L5 level degenerative changes most significant with multilevel degenerative changes noted throughout the lumbar spine  Postherpetic neuralgia thoracic region  Sacroiliac joint dysfunction  Greater trochanteric bursitis    Plan  Continue present medication fentanyl patches and oxycodone  F/U PCP Dr. Janae Bridgeman for evaliation of  BP and general medical  Condition  F/U Dr. Shirley Muscat [facility]  F/U surgical evaluation  F/U Dr.G W [name]   F/U neurological evaluation  May consider radiofrequency rhizolysis or intraspinal procedures pending response to present treatment and F/U evaluation   Patient to call [facility] Management Center should patient have concerns prior to scheduled return appointmen.

## 2014-09-20 ENCOUNTER — Other Ambulatory Visit: Payer: Self-pay | Admitting: Pain Medicine

## 2014-10-14 ENCOUNTER — Ambulatory Visit: Payer: Medicare Other | Attending: Pain Medicine | Admitting: Pain Medicine

## 2014-10-14 ENCOUNTER — Encounter: Payer: Self-pay | Admitting: Pain Medicine

## 2014-10-14 VITALS — BP 102/41 | HR 72 | Temp 98.3°F | Resp 16 | Ht 67.0 in | Wt 175.0 lb

## 2014-10-14 DIAGNOSIS — B0229 Other postherpetic nervous system involvement: Secondary | ICD-10-CM | POA: Diagnosis not present

## 2014-10-14 DIAGNOSIS — M545 Low back pain: Secondary | ICD-10-CM | POA: Diagnosis present

## 2014-10-14 DIAGNOSIS — M7060 Trochanteric bursitis, unspecified hip: Secondary | ICD-10-CM | POA: Diagnosis not present

## 2014-10-14 DIAGNOSIS — M79604 Pain in right leg: Secondary | ICD-10-CM | POA: Diagnosis present

## 2014-10-14 DIAGNOSIS — M79605 Pain in left leg: Secondary | ICD-10-CM | POA: Diagnosis present

## 2014-10-14 DIAGNOSIS — M47816 Spondylosis without myelopathy or radiculopathy, lumbar region: Secondary | ICD-10-CM

## 2014-10-14 DIAGNOSIS — M47896 Other spondylosis, lumbar region: Secondary | ICD-10-CM | POA: Insufficient documentation

## 2014-10-14 DIAGNOSIS — B0223 Postherpetic polyneuropathy: Secondary | ICD-10-CM | POA: Diagnosis not present

## 2014-10-14 DIAGNOSIS — M5136 Other intervertebral disc degeneration, lumbar region: Secondary | ICD-10-CM | POA: Insufficient documentation

## 2014-10-14 DIAGNOSIS — R0782 Intercostal pain: Secondary | ICD-10-CM | POA: Diagnosis not present

## 2014-10-14 DIAGNOSIS — M533 Sacrococcygeal disorders, not elsewhere classified: Secondary | ICD-10-CM | POA: Diagnosis not present

## 2014-10-14 DIAGNOSIS — M47817 Spondylosis without myelopathy or radiculopathy, lumbosacral region: Secondary | ICD-10-CM | POA: Diagnosis not present

## 2014-10-14 DIAGNOSIS — M5416 Radiculopathy, lumbar region: Secondary | ICD-10-CM | POA: Diagnosis not present

## 2014-10-14 MED ORDER — OXYCODONE HCL 5 MG PO TABS: ORAL_TABLET | ORAL | Status: DC

## 2014-10-14 MED ORDER — FENTANYL 25 MCG/HR TD PT72: MEDICATED_PATCH | TRANSDERMAL | Status: DC

## 2014-10-14 MED ORDER — FENTANYL 100 MCG/HR TD PT72: MEDICATED_PATCH | TRANSDERMAL | Status: DC

## 2014-10-14 NOTE — Progress Notes (Signed)
Discharged to home, ambulatory with script in hand for fentanyl 25 mcg, fentanyl 100 mcg, and oxycodone.  Pre procedure instructions given with teach back 3 done.

## 2014-10-14 NOTE — Patient Instructions (Addendum)
PLAN   Continue present medication Fentanyl patches with oxycodone as needed  Lumbar epidural steroid injection to be performed at time return appointment  F/U PCP Dr. Einar Pheasant for evaliation of  BP and general medical  condition  F/U surgical evaluation. May consider pending follow-up evaluations  F/U neurological evaluation. May consider pending follow-up evaluations  F/U rheumatological evaluation as needed  May consider radiofrequency rhizolysis or intraspinal procedures pending response to present treatment and F/U evaluation   Patient to call [facility] should patient have concerns prior to scheduled return appointment. GENERAL RISKS AND COMPLICATIONS  What are the risk, side effects and possible complications? Generally speaking, most procedures are safe.  However, with any procedure there are risks, side effects, and the possibility of complications.  The risks and complications are dependent upon the sites that are lesioned, or the type of nerve block to be performed.  The closer the procedure is to the spine, the more serious the risks are.  Great care is taken when placing the radio frequency needles, block needles or lesioning probes, but sometimes complications can occur. 1. Infection: Any time there is an injection through the skin, there is a risk of infection.  This is why sterile conditions are used for these blocks.  There are four possible types of infection. 1. Localized skin infection. 2. Central Nervous System Infection-This can be in the form of Meningitis, which can be deadly. 3. Epidural Infections-This can be in the form of an epidural abscess, which can cause pressure inside of the spine, causing compression of the spinal cord with subsequent paralysis. This would require an emergency surgery to decompress, and there are no guarantees that the patient would recover from the paralysis. 4. Discitis-This is an infection of the intervertebral discs.   It occurs in about 1% of discography procedures.  It is difficult to treat and it may lead to surgery.        2. Pain: the needles have to go through skin and soft tissues, will cause soreness.       3. Damage to internal structures:  The nerves to be lesioned may be near blood vessels or    other nerves which can be potentially damaged.       4. Bleeding: Bleeding is more common if the patient is taking blood thinners such as  aspirin, Coumadin, Ticiid, Plavix, etc., or if he/she have some genetic predisposition  such as hemophilia. Bleeding into the spinal canal can cause compression of the spinal  cord with subsequent paralysis.  This would require an emergency surgery to  decompress and there are no guarantees that the patient would recover from the  paralysis.       5. Pneumothorax:  Puncturing of a lung is a possibility, every time a needle is introduced in  the area of the chest or upper back.  Pneumothorax refers to free air around the  collapsed lung(s), inside of the thoracic cavity (chest cavity).  Another two possible  complications related to a similar event would include: Hemothorax and Chylothorax.   These are variations of the Pneumothorax, where instead of air around the collapsed  lung(s), you may have blood or chyle, respectively.       6. Spinal headaches: They may occur with any procedures in the area of the spine.       7. Persistent CSF (Cerebro-Spinal Fluid) leakage: This is a rare problem, but may occur  with prolonged intrathecal or epidural catheters either due  to the formation of a fistulous  track or a dural tear.       8. Nerve damage: By working so close to the spinal cord, there is always a possibility of  nerve damage, which could be as serious as a permanent spinal cord injury with  paralysis.       9. Death:  Although rare, severe deadly allergic reactions known as "Anaphylactic  reaction" can occur to any of the medications used.      10. Worsening of the symptoms:  We  can always make thing worse.  What are the chances of something like this happening? Chances of any of this occuring are extremely low.  By statistics, you have more of a chance of getting killed in a motor vehicle accident: while driving to the hospital than any of the above occurring .  Nevertheless, you should be aware that they are possibilities.  In general, it is similar to taking a shower.  Everybody knows that you can slip, hit your head and get killed.  Does that mean that you should not shower again?  Nevertheless always keep in mind that statistics do not mean anything if you happen to be on the wrong side of them.  Even if a procedure has a 1 (one) in a 1,000,000 (million) chance of going wrong, it you happen to be that one..Also, keep in mind that by statistics, you have more of a chance of having something go wrong when taking medications.  Who should not have this procedure? If you are on a blood thinning medication (e.g. Coumadin, Plavix, see list of "Blood Thinners"), or if you have an active infection going on, you should not have the procedure.  If you are taking any blood thinners, please inform your physician.  How should I prepare for this procedure?  Do not eat or drink anything at least six hours prior to the procedure.  Bring a driver with you .  It cannot be a taxi.  Come accompanied by an adult that can drive you back, and that is strong enough to help you if your legs get weak or numb from the local anesthetic.  Take all of your medicines the morning of the procedure with just enough water to swallow them.  If you have diabetes, make sure that you are scheduled to have your procedure done first thing in the morning, whenever possible.  If you have diabetes, take only half of your insulin dose and notify our nurse that you have done so as soon as you arrive at the clinic.  If you are diabetic, but only take blood sugar pills (oral hypoglycemic), then do not take them  on the morning of your procedure.  You may take them after you have had the procedure.  Do not take aspirin or any aspirin-containing medications, at least eleven (11) days prior to the procedure.  They may prolong bleeding.  Wear loose fitting clothing that may be easy to take off and that you would not mind if it got stained with Betadine or blood.  Do not wear any jewelry or perfume  Remove any nail coloring.  It will interfere with some of our monitoring equipment.  NOTE: Remember that this is not meant to be interpreted as a complete list of all possible complications.  Unforeseen problems may occur.  BLOOD THINNERS The following drugs contain aspirin or other products, which can cause increased bleeding during surgery and should not be taken for 2 weeks  prior to and 1 week after surgery.  If you should need take something for relief of minor pain, you may take acetaminophen which is found in Tylenol,m Datril, Anacin-3 and Panadol. It is not blood thinner. The products listed below are.  Do not take any of the products listed below in addition to any listed on your instruction sheet.  A.P.C or A.P.C with Codeine Codeine Phosphate Capsules #3 Ibuprofen Ridaura  ABC compound Congesprin Imuran rimadil  Advil Cope Indocin Robaxisal  Alka-Seltzer Effervescent Pain Reliever and Antacid Coricidin or Coricidin-D  Indomethacin Rufen  Alka-Seltzer plus Cold Medicine Cosprin Ketoprofen S-A-C Tablets  Anacin Analgesic Tablets or Capsules Coumadin Korlgesic Salflex  Anacin Extra Strength Analgesic tablets or capsules CP-2 Tablets Lanoril Salicylate  Anaprox Cuprimine Capsules Levenox Salocol  Anexsia-D Dalteparin Magan Salsalate  Anodynos Darvon compound Magnesium Salicylate Sine-off  Ansaid Dasin Capsules Magsal Sodium Salicylate  Anturane Depen Capsules Marnal Soma  APF Arthritis pain formula Dewitt's Pills Measurin Stanback  Argesic Dia-Gesic Meclofenamic Sulfinpyrazone  Arthritis Bayer  Timed Release Aspirin Diclofenac Meclomen Sulindac  Arthritis pain formula Anacin Dicumarol Medipren Supac  Analgesic (Safety coated) Arthralgen Diffunasal Mefanamic Suprofen  Arthritis Strength Bufferin Dihydrocodeine Mepro Compound Suprol  Arthropan liquid Dopirydamole Methcarbomol with Aspirin Synalgos  ASA tablets/Enseals Disalcid Micrainin Tagament  Ascriptin Doan's Midol Talwin  Ascriptin A/D Dolene Mobidin Tanderil  Ascriptin Extra Strength Dolobid Moblgesic Ticlid  Ascriptin with Codeine Doloprin or Doloprin with Codeine Momentum Tolectin  Asperbuf Duoprin Mono-gesic Trendar  Aspergum Duradyne Motrin or Motrin IB Triminicin  Aspirin plain, buffered or enteric coated Durasal Myochrisine Trigesic  Aspirin Suppositories Easprin Nalfon Trillsate  Aspirin with Codeine Ecotrin Regular or Extra Strength Naprosyn Uracel  Atromid-S Efficin Naproxen Ursinus  Auranofin Capsules Elmiron Neocylate Vanquish  Axotal Emagrin Norgesic Verin  Azathioprine Empirin or Empirin with Codeine Normiflo Vitamin E  Azolid Emprazil Nuprin Voltaren  Bayer Aspirin plain, buffered or children's or timed BC Tablets or powders Encaprin Orgaran Warfarin Sodium  Buff-a-Comp Enoxaparin Orudis Zorpin  Buff-a-Comp with Codeine Equegesic Os-Cal-Gesic   Buffaprin Excedrin plain, buffered or Extra Strength Oxalid   Bufferin Arthritis Strength Feldene Oxphenbutazone   Bufferin plain or Extra Strength Feldene Capsules Oxycodone with Aspirin   Bufferin with Codeine Fenoprofen Fenoprofen Pabalate or Pabalate-SF   Buffets II Flogesic Panagesic   Buffinol plain or Extra Strength Florinal or Florinal with Codeine Panwarfarin   Buf-Tabs Flurbiprofen Penicillamine   Butalbital Compound Four-way cold tablets Penicillin   Butazolidin Fragmin Pepto-Bismol   Carbenicillin Geminisyn Percodan   Carna Arthritis Reliever Geopen Persantine   Carprofen Gold's salt Persistin   Chloramphenicol Goody's Phenylbutazone   Chloromycetin  [place]altrain Piroxlcam   Clmetidine heparin Plaquenil   Cllnoril Hyco-pap Ponstel   Clofibrate Hydroxy chloroquine Propoxyphen         Before stopping any of these medications, be sure to consult the physician who ordered them.  Some, such as Coumadin (Warfarin) are ordered to prevent or treat serious conditions such as "deep thrombosis", "pumonary embolisms", and other heart problems.  The amount of time that you may need off of the medication may also vary with the medication and the reason for which you were taking it.  If you are taking any of these medications, please make sure you notify your pain physician before you undergo any procedures.         Epidural Steroid Injection Patient Information  Description: The epidural space surrounds the nerves as they exit the spinal cord.  In some patients, the nerves can  be compressed and inflamed by a bulging disc or a tight spinal canal (spinal stenosis).  By injecting steroids into the epidural space, we can bring irritated nerves into direct contact with a potentially helpful medication.  These steroids act directly on the irritated nerves and can reduce swelling and inflammation which often leads to decreased pain.  Epidural steroids may be injected anywhere along the spine and from the neck to the low back depending upon the location of your pain.   After numbing the skin with local anesthetic (like Novocaine), a small needle is passed into the epidural space slowly.  You may experience a sensation of pressure while this is being done.  The entire block usually last less than 10 minutes.  Conditions which may be treated by epidural steroids:   Low back and leg pain  Neck and arm pain  Spinal stenosis  Post-laminectomy syndrome  Herpes zoster (shingles) pain  Pain from compression fractures  Preparation for the injection:  1. Do not eat any solid food or dairy products within 6 hours of your appointment.  2. You may drink clear  liquids up to 2 hours before appointment.  Clear liquids include water, black coffee, juice or soda.  No milk or cream please. 3. You may take your regular medication, including pain medications, with a sip of water before your appointment  Diabetics should hold regular insulin (if taken separately) and take 1/2 normal NPH dos the morning of the procedure.  Carry some sugar containing items with you to your appointment. 4. A driver must accompany you and be prepared to drive you home after your procedure.  5. Bring all your current medications with your. 6. An IV may be inserted and sedation may be given at the discretion of the physician.   7. A blood pressure cuff, EKG and other monitors will often be applied during the procedure.  Some patients may need to have extra oxygen administered for a short period. 8. You will be asked to provide medical information, including your allergies, prior to the procedure.  We must know immediately if you are taking blood thinners (like Coumadin/Warfarin)  Or if you are allergic to IV iodine contrast (dye). We must know if you could possible be pregnant.  Possible side-effects:  Bleeding from needle site  Infection (rare, may require surgery)  Nerve injury (rare)  Numbness & tingling (temporary)  Difficulty urinating (rare, temporary)  Spinal headache ( a headache worse with upright posture)  Light -headedness (temporary)  Pain at injection site (several days)  Decreased blood pressure (temporary)  Weakness in arm/leg (temporary)  Pressure sensation in back/neck (temporary)  Call if you experience:  Fever/chills associated with headache or increased back/neck pain.  Headache worsened by an upright position.  New onset weakness or numbness of an extremity below the injection site  Hives or difficulty breathing (go to the emergency room)  Inflammation or drainage at the infection site  Severe back/neck pain  Any new symptoms which are  concerning to you  Please note:  Although the local anesthetic injected can often make your back or neck feel good for several hours after the injection, the pain will likely return.  It takes 3-7 days for steroids to work in the epidural space.  You may not notice any pain relief for at least that one week.  If effective, we will often do a series of three injections spaced 3-6 weeks apart to maximally decrease your pain.  After the initial series,  we generally will wait several months before considering a repeat injection of the same type.  If you have any questions, please call [phone] [facility]

## 2014-10-14 NOTE — Progress Notes (Signed)
Safety precautions to be maintained throughout the outpatient stay will include: orient to surroundings, keep bed in low position, maintain call bell within reach at all times, provide assistance with transfer out of bed and ambulation.  

## 2014-10-14 NOTE — Progress Notes (Signed)
Subjective:    Patient ID: Maria Keller, female    DOB: 10/02/1950, 64 y.o.   MRN: [id number]  HPI  Patient is 64 year old female who returns to [facility] for further evaluation and treatment of pain involving the lower back lower extremity region as well as the cervical region. Patient states that she has had onset of severely disabling lower back lower extremity pain with radiation of pain for the lower extremities. Patient admits to some excessive activities and states that this may have been etiology of her pain. We discussed patient's condition patient with known degenerative changes of the lumbar spine. We Discussed additional studies of the spine and evaluations in the present time patient wished to proceed with lumbar epidural steroid injection at time return appointment in attempt to decrease severity of symptoms, minimize progression of symptoms, and avoid the need for more involved treatment. We will proceed with lumbar epidural steroid injection at time return appointment and will continue medications as prescribed consisting of fentanyl patch oxycodone and Zanaflex. All understanding and were in agreement with suggested treatment plan    Review of Systems     Objective:   Physical Exam   There was tennis to palpation of the splenius capitis and occipitalis musculature region of mild to moderate degree. There appeared to be no new lesions of the head and neck noted. There was unremarkable Spurling's maneuver. Patient appeared to be with slightly decreased grip strength. Tinel and Phalen's maneuver without increased pain of significant degree. There was minimal tenderness of the acromioclavicular and glenohumeral joint regions. Palpation over the thoracic facet thoracic paraspinal musculature and associated increased pain especially in the lower thoracic paraspinal musculature region. No crepitus of the thoracic region was noted. Palpation over the lumbar paraspinal muscles  region lumbar facet region associated with moderately severe discomfort right greater than left extension and palpation of the lumbar facets reproduce moderate marked severe discomfort. Straight leg raising decreased approximately 20 with increased pain with dorsiflexion noted. DTRs are difficulthad difficulty relaxing. There was severe tenderness to palpation of the gluteal and piriformis musculature regions. Lateral bending rotation extension and palpation of the lumbar facets reproduce severe discomfort as well no definite sensory deficit of dermatomal distribution was detected. There was moderate tends to palpation of the greater trochanteric region and iliotibial band region. Palpation of the PSIS and PII S region reproduced mild to moderate discomfort. Abdomen nontender no costovertebral maintenance noted.      Assessment & Plan:    Degenerative disc disease lumbar spine L4-L5 level degenerative changes most significant with multilevel degenerative changes noted throughout the lumbar spine  Lumbar radiculopathy  Postherpetic neuralgia thoracic region  Sacroiliac joint dysfunction  Greater trochanteric bursitis    PLAN   Continue present medication Zanaflex oxycodone and fentanyl patches  Lumbar epidural steroid injection to be performed at time return appointment Lumbar epidural steroid injection to be performed in attempt to decrease severity of symptoms, minimize progression of symptoms, and avoid the need for more involved treatment. Patient with severe disabling lumbar lower extremity pain with concern regarding lumbar radiculopathy  F/U PCP Dr. Waldron Labs for evaliation of  BP and general medical  condition  F/U surgical evaluation. May consider pending follow-up evaluations  F/U neurological evaluation. May consider pending follow-up evaluations  May consider radiofrequency rhizolysis or intraspinal procedures pending response to present treatment and F/U evaluation    Patient to call [facility] should patient have concerns prior to scheduled return appointment.

## 2014-10-18 ENCOUNTER — Ambulatory Visit: Payer: Medicare Other | Attending: Pain Medicine | Admitting: Pain Medicine

## 2014-10-18 ENCOUNTER — Encounter: Payer: Self-pay | Admitting: Pain Medicine

## 2014-10-18 VITALS — BP 106/48 | HR 64 | Temp 98.4°F | Resp 16 | Ht 67.0 in | Wt 175.0 lb

## 2014-10-18 DIAGNOSIS — M5136 Other intervertebral disc degeneration, lumbar region: Secondary | ICD-10-CM | POA: Insufficient documentation

## 2014-10-18 DIAGNOSIS — M5416 Radiculopathy, lumbar region: Secondary | ICD-10-CM | POA: Diagnosis not present

## 2014-10-18 DIAGNOSIS — M47816 Spondylosis without myelopathy or radiculopathy, lumbar region: Secondary | ICD-10-CM | POA: Insufficient documentation

## 2014-10-18 DIAGNOSIS — R21 Rash and other nonspecific skin eruption: Secondary | ICD-10-CM

## 2014-10-18 DIAGNOSIS — M545 Low back pain: Secondary | ICD-10-CM | POA: Diagnosis present

## 2014-10-18 DIAGNOSIS — M79604 Pain in right leg: Secondary | ICD-10-CM

## 2014-10-18 DIAGNOSIS — M79605 Pain in left leg: Secondary | ICD-10-CM | POA: Diagnosis present

## 2014-10-18 MED ORDER — TRIAMCINOLONE ACETONIDE 40 MG/ML IJ SUSP
INTRAMUSCULAR | Status: AC
  Administered 2014-10-18: 40 mg
  Filled 2014-10-18: qty 1

## 2014-10-18 MED ORDER — DOXYCYCLINE HYCLATE 100 MG PO TABS: 100.0000 mg | ORAL_TABLET | Freq: Two times a day (BID) | ORAL | Status: DC

## 2014-10-18 MED ORDER — SODIUM CHLORIDE 0.9 % IJ SOLN
INTRAMUSCULAR | Status: AC
  Administered 2014-10-18: 4 mL
  Filled 2014-10-18: qty 20

## 2014-10-18 MED ORDER — LIDOCAINE HCL (PF) 1 % IJ SOLN
INTRAMUSCULAR | Status: AC
  Administered 2014-10-18: 5 mL
  Filled 2014-10-18: qty 5

## 2014-10-18 MED ORDER — KETOROLAC TROMETHAMINE 60 MG/2ML IM SOLN
INTRAMUSCULAR | Status: AC
  Administered 2014-10-18: 60 mg
  Filled 2014-10-18: qty 2

## 2014-10-18 MED ORDER — ORPHENADRINE CITRATE 30 MG/ML IJ SOLN
INTRAMUSCULAR | Status: AC
  Administered 2014-10-18: 60 mg
  Filled 2014-10-18: qty 2

## 2014-10-18 MED ORDER — FENTANYL CITRATE (PF) 100 MCG/2ML IJ SOLN
INTRAMUSCULAR | Status: AC
  Administered 2014-10-18: 50 ug via INTRAVENOUS
  Filled 2014-10-18: qty 2

## 2014-10-18 MED ORDER — MIDAZOLAM HCL 5 MG/5ML IJ SOLN
INTRAMUSCULAR | Status: AC
  Administered 2014-10-18: 2 mg via INTRAVENOUS
  Filled 2014-10-18: qty 5

## 2014-10-18 MED ORDER — BUPIVACAINE HCL (PF) 0.25 % IJ SOLN
INTRAMUSCULAR | Status: AC
  Administered 2014-10-18: 20 mL
  Filled 2014-10-18: qty 30

## 2014-10-18 NOTE — Progress Notes (Signed)
   Subjective:    Patient ID: Maria Keller, female    DOB: September 21, 1950, 64 y.o.   MRN: [id number]  HPI  PROCEDURE PERFORMED: Lumbar epidural steroid injection   NOTE: The patie MRI nt is a 64 y.o. female who [facility] evaluation and treatment of pain involving the lumbar and lower extremity region. MRI revealed the patient to be with multilevel degenerative changes of the lumbar spine. There is concern regarding degenerative changes of the lumbar spine contributing to patient's symptomatology with concern regarding radiculopathy. There is also concern regarding lumbar stenosis with neurogenic claudication. The risks, benefits, and expectations of the procedure have been discussed and explained to the patient who was understanding and in agreement with suggested treatment plan. We will proceed with lumbar epidural steroid injection as discussed and as explained to the patient who is willing to proceed with procedure as planned.   DESCRIPTION OF PROCEDURE: Lumbar epidural steroid injection with IV Versed, IV fentanyl conscious sedation, EKG, blood pressure, pulse, and pulse oximetry monitoring. The procedure was performed with the patient in the prone position under fluoroscopic guidance. A local anesthetic skin wheal of 1.5% plain lidocaine was accomplished at proposed entry site. An 18-gauge Tuohy epidural needle was inserted at the L 4 vertebral body level right of the midline via loss-of-resistance technique with negative heme and negative CSF return. A total of 4 mL of Preservative-Free normal saline with 40 mg of Kenalog injected incrementally via epidurally placed needle. Needle was removed.   Myoneural block injections of the gluteal musculature region Following Betadine prep of proposed entry site a 22-gauge needle was inserted in the gluteal musculature region and following negative aspiration for cc of 0.25% bupivacaine with Norflex and Toradol was injected  for myoneural block injections of the gluteal musculature region 2    A total of 40 mg of Kenalog was utilized for the procedure.   The patient tolerated the injection well.    PLAN:   1. Medications: We will continue presently prescribed medications. Oxycodone and fentanyl patches 2. Will consider modification of treatment regimen pending response to treatment rendered on today's visit and follow-up evaluation. 3. The patient is to follow-up with primary care physician Dr.C Nicki Reaper  and  4. Dr. Leretha Pol  regarding blood pressure and general medical condition status post lumbar epidural steroid injection performed on today's visit. 5. Surgical evaluation.Patient may consider pending follow-up evaluation  6. Neurological evaluation. May consider [facility] EMG studies and other studies. 7. The patient may be a candidate for radiofrequency procedures, implantation device, and other treatment pending response to treatment and follow-up evaluation. 8. The patient has been advised to adhere to proper body mechanics and avoid activities which appear to aggravate condition. 9. The patient has been advised to call the [facility] prior to scheduled return appointment should there be significant change in condition or should there be sign  The patient is understanding and agrees with the suggested  treatment plan   Review of Systems     Objective:   Physical Exam        Assessment & Plan:

## 2014-10-18 NOTE — Progress Notes (Signed)
Safety precautions to be maintained throughout the outpatient stay will include: orient to surroundings, keep bed in low position, maintain call bell within reach at all times, provide assistance with transfer out of bed and ambulation.  

## 2014-10-18 NOTE — Patient Instructions (Addendum)
PLAN  Continue present medication oxycodone and fentanyl patches and begin taking antibiotic doxycycline as prescribed. Please obtain your antibiotic doxycycline today and begin taking antibiotic today  F/U PCP Dr. Ronda Fairly for evaliation of  BP and general medical condition  F/U Dr Leretha Pol  F/U surgical evaluation. May consider pending follow-up evaluations  F/U neurological evaluation. May consider pending follow-up evaluations  May consider radiofrequency rhizolysis or intraspinal procedures pending response to present treatment and F/U evaluation.  Patient to call Pain Management Center should patient have concerns prior to scheduled return appointment.  Pain Management Discharge Instructions  General Discharge Instructions :  If you need to reach your doctor call: Monday-Friday 8:00 am - 4:00 pm at [phone] or toll free [phone].  After clinic hours [phone] to have operator reach doctor.  Bring all of your medication bottles to all your appointments in the pain clinic.  To cancel or reschedule your appointment with Pain Management please remember to call 24 hours in advance to avoid a fee.  Refer to the educational materials which you have been given on: General Risks, I had my Procedure. Discharge Instructions, Post Sedation.  Post Procedure Instructions:  The drugs you were given will stay in your system until tomorrow, so for the next 24 hours you should not drive, make any legal decisions or drink any alcoholic beverages.  You may eat anything you prefer, but it is better to start with liquids then soups and crackers, and gradually work up to solid foods.  Please notify your doctor immediately if you have any unusual bleeding, trouble breathing or pain that is not related to your normal pain.  Depending on the type of procedure that was done, some parts of your body may feel week and/or numb.  This usually clears up by tonight or the next day.  Walk with  the use of an assistive device or accompanied by an adult for the 24 hours.  You may use ice on the affected area for the first 24 hours.  Put ice in a Ziploc bag and cover with a towel and place against area 15 minutes on 15 minutes off.  You may switch to heat after 24 hours.Epidural Steroid Injection Patient Information  Description: The epidural space surrounds the nerves as they exit the spinal cord.  In some patients, the nerves can be compressed and inflamed by a bulging disc or a tight spinal canal (spinal stenosis).  By injecting steroids into the epidural space, we can bring irritated nerves into direct contact with a potentially helpful medication.  These steroids act directly on the irritated nerves and can reduce swelling and inflammation which often leads to decreased pain.  Epidural steroids may be injected anywhere along the spine and from the neck to the low back depending upon the location of your pain.   After numbing the skin with local anesthetic (like Novocaine), a small needle is passed into the epidural space slowly.  You may experience a sensation of pressure while this is being done.  The entire block usually last less than 10 minutes.  Conditions which may be treated by epidural steroids:   Low back and leg pain  Neck and arm pain  Spinal stenosis  Post-laminectomy syndrome  Herpes zoster (shingles) pain  Pain from compression fractures  Preparation for the injection:  1. Do not eat any solid food or dairy products within 6 hours of your appointment.  2. You may drink clear liquids up to 2 hours before appointment.  Clear liquids include water, black coffee, juice or soda.  No milk or cream please. 3. You may take your regular medication, including pain medications, with a sip of water before your appointment  Diabetics should hold regular insulin (if taken separately) and take 1/2 normal NPH dos the morning of the procedure.  Carry some sugar containing items  with you to your appointment. 4. A driver must accompany you and be prepared to drive you home after your procedure.  5. Bring all your current medications with your. 6. An IV may be inserted and sedation may be given at the discretion of the physician.   7. A blood pressure cuff, EKG and other monitors will often be applied during the procedure.  Some patients may need to have extra oxygen administered for a short period. 8. You will be asked to provide medical information, including your allergies, prior to the procedure.  We must know immediately if you are taking blood thinners (like Coumadin/Warfarin)  Or if you are allergic to IV iodine contrast (dye). We must know if you could possible be pregnant.  Possible side-effects:  Bleeding from needle site  Infection (rare, may require surgery)  Nerve injury (rare)  Numbness & tingling (temporary)  Difficulty urinating (rare, temporary)  Spinal headache ( a headache worse with upright posture)  Light -headedness (temporary)  Pain at injection site (several days)  Decreased blood pressure (temporary)  Weakness in arm/leg (temporary)  Pressure sensation in back/neck (temporary)  Call if you experience:  Fever/chills associated with headache or increased back/neck pain.  Headache worsened by an upright position.  New onset weakness or numbness of an extremity below the injection site  Hives or difficulty breathing (go to the emergency room)  Inflammation or drainage at the infection site  Severe back/neck pain  Any new symptoms which are concerning to you  Please note:  Although the local anesthetic injected can often make your back or neck feel good for several hours after the injection, the pain will likely return.  It takes 3-7 days for steroids to work in the epidural space.  You may not notice any pain relief for at least that one week.  If effective, we will often do a series of three injections spaced 3-6 weeks  apart to maximally decrease your pain.  After the initial series, we generally will wait several months before considering a repeat injection of the same type.  If you have any questions, please call [phone] [facility]

## 2014-10-19 ENCOUNTER — Telehealth: Payer: Self-pay

## 2014-10-19 NOTE — Telephone Encounter (Signed)
Pt states she is doing OK 

## 2014-10-27 ENCOUNTER — Encounter: Payer: Self-pay | Admitting: Internal Medicine

## 2014-10-27 ENCOUNTER — Other Ambulatory Visit: Payer: Self-pay | Admitting: Internal Medicine

## 2014-10-27 ENCOUNTER — Ambulatory Visit (INDEPENDENT_AMBULATORY_CARE_PROVIDER_SITE_OTHER): Payer: Medicare Other | Admitting: Internal Medicine

## 2014-10-27 VITALS — BP 100/70 | HR 67 | Temp 98.4°F | Resp 18 | Ht 67.5 in | Wt 184.0 lb

## 2014-10-27 DIAGNOSIS — K219 Gastro-esophageal reflux disease without esophagitis: Secondary | ICD-10-CM

## 2014-10-27 DIAGNOSIS — C8590 Non-Hodgkin lymphoma, unspecified, unspecified site: Secondary | ICD-10-CM

## 2014-10-27 DIAGNOSIS — M549 Dorsalgia, unspecified: Secondary | ICD-10-CM

## 2014-10-27 DIAGNOSIS — R61 Generalized hyperhidrosis: Secondary | ICD-10-CM

## 2014-10-27 DIAGNOSIS — R739 Hyperglycemia, unspecified: Secondary | ICD-10-CM

## 2014-10-27 DIAGNOSIS — Z23 Encounter for immunization: Secondary | ICD-10-CM | POA: Diagnosis not present

## 2014-10-27 DIAGNOSIS — Z1239 Encounter for other screening for malignant neoplasm of breast: Secondary | ICD-10-CM | POA: Diagnosis not present

## 2014-10-27 DIAGNOSIS — E7800 Pure hypercholesterolemia, unspecified: Secondary | ICD-10-CM

## 2014-10-27 DIAGNOSIS — G8929 Other chronic pain: Secondary | ICD-10-CM

## 2014-10-27 DIAGNOSIS — M797 Fibromyalgia: Secondary | ICD-10-CM

## 2014-10-27 DIAGNOSIS — D649 Anemia, unspecified: Secondary | ICD-10-CM

## 2014-10-27 DIAGNOSIS — K589 Irritable bowel syndrome without diarrhea: Secondary | ICD-10-CM

## 2014-10-27 DIAGNOSIS — M79606 Pain in leg, unspecified: Secondary | ICD-10-CM

## 2014-10-27 LAB — LIPID PANEL
CHOL/HDL RATIO: 4
Cholesterol: 208 mg/dL — ABNORMAL HIGH (ref 0–200)
HDL: 55.1 mg/dL (ref >39.00)
LDL CALC: 134 mg/dL — AB (ref 0–99)
NONHDL: 152.61
Triglycerides: 95 mg/dL (ref 0.0–149.0)
VLDL: 19 mg/dL (ref 0.0–40.0)

## 2014-10-27 LAB — HEMOGLOBIN A1C: HEMOGLOBIN A1C: 5.7 % (ref 4.6–6.5)

## 2014-10-27 LAB — CBC WITH DIFFERENTIAL/PLATELET
BASOS PCT: 0.3 % (ref 0.0–3.0)
Basophils Absolute: 0 10*3/uL (ref 0.0–0.1)
Eosinophils Absolute: 0.1 10*3/uL (ref 0.0–0.7)
Eosinophils Relative: 0.9 % (ref 0.0–5.0)
HEMATOCRIT: 36.1 % (ref 36.0–46.0)
Hemoglobin: 11.9 g/dL — ABNORMAL LOW (ref 12.0–15.0)
LYMPHS ABS: 4.4 10*3/uL — AB (ref 0.7–4.0)
LYMPHS PCT: 38.4 % (ref 12.0–46.0)
MCHC: 32.8 g/dL (ref 30.0–36.0)
MCV: 81.4 fl (ref 78.0–100.0)
MONOS PCT: 7.3 % (ref 3.0–12.0)
Monocytes Absolute: 0.8 10*3/uL (ref 0.1–1.0)
NEUTROS ABS: 6 10*3/uL (ref 1.4–7.7)
NEUTROS PCT: 53.1 % (ref 43.0–77.0)
PLATELETS: 261 10*3/uL (ref 150.0–400.0)
RBC: 4.43 Mil/uL (ref 3.87–5.11)
RDW: 16.2 % — AB (ref 11.5–15.5)
WBC: 11.4 10*3/uL — ABNORMAL HIGH (ref 4.0–10.5)

## 2014-10-27 LAB — BASIC METABOLIC PANEL
BUN: 18 mg/dL (ref 6–23)
CHLORIDE: 103 meq/L (ref 96–112)
CO2: 36 meq/L — AB (ref 19–32)
Calcium: 9.5 mg/dL (ref 8.4–10.5)
Creatinine, Ser: 0.79 mg/dL (ref 0.40–1.20)
GFR: 77.41 mL/min (ref >60.00)
GLUCOSE: 92 mg/dL (ref 70–99)
POTASSIUM: 4.9 meq/L (ref 3.5–5.1)
Sodium: 142 mEq/L (ref 135–145)

## 2014-10-27 LAB — HEPATIC FUNCTION PANEL
ALT: 30 U/L (ref 0–35)
AST: 24 U/L (ref 0–37)
Albumin: 4.2 g/dL (ref 3.5–5.2)
Alkaline Phosphatase: 89 U/L (ref 39–117)
BILIRUBIN DIRECT: 0 mg/dL (ref 0.0–0.3)
BILIRUBIN TOTAL: 0.4 mg/dL (ref 0.2–1.2)
TOTAL PROTEIN: 7.3 g/dL (ref 6.0–8.3)

## 2014-10-27 LAB — TSH: TSH: 2.01 u[IU]/mL (ref 0.35–4.50)

## 2014-10-27 NOTE — Progress Notes (Signed)
Order placed for f/u labs.  

## 2014-10-27 NOTE — Progress Notes (Signed)
Pre visit review using our clinic review tool, if applicable. No additional management support is needed unless otherwise documented below in the visit note. 

## 2014-10-27 NOTE — Progress Notes (Signed)
Patient ID: Maria Keller, female   DOB: Jun 06, 1950, 64 y.o.   MRN: [id number]   Subjective:    Patient ID: Maria Keller, female    DOB: 1950/12/28, 64 y.o.   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  She comes in today to follow up on these issues.  States she has been doing relatively well.  Increased stress with her husband's medical condition.  Also, increased stress with her ex husband's health as well.  Feels she is coping relatively well.  Trying to watch what she eats.  Trying to be more active.  No cardiac symptoms with increased activity or exertion.  No sob.  No nausea or vomiting.  Bowels stable.  Chronic pain.  Just had epidural.  Feels better.  Worked well.  She does report noticing some night sweats.  Started recently.  Minimal.  Does not feel needs any further intervention.  States she mentioned this to Dr Leretha Pol.  Felt lymphoma stable.  No fever.  No infectious symptoms.  No cough.     Past Medical History  Diagnosis Date  . Follicular lymphoma     Followed by Dr Leretha Pol, s/p chemo and XRT  . Anemia     iron deficiency and B12 deficiency  . Hypercholesterolemia   . Shingles outbreak 11/26/2011  . IBS (irritable bowel syndrome)   . Chronic back pain     followed by Dr Primus Bravo  . Nephrolithiasis     followed by Dr Bernardo Heater, s/p stents  . Esophageal stricture     requiring dilatation x 2  . Hiatal hernia   . Depression   . Fibromyalgia   . GERD (gastroesophageal reflux disease)   . IBS (irritable bowel syndrome)   . Follicular lymphoma   . Fibromyalgia    Past Surgical History  Procedure Laterality Date  . Tonsilectomy/adenoidectomy with myringotomy  1970  . Appendectomy    . Tubal ligation  1976  . Vaginal hysterectomy  1980's    secondary to bleeding  . Left oophorectomy    . Ovarian cyst removal      right  . Bladder surgery  1993  . Cholecystectomy  1995    . Orif ankle fracture  2012  . Rectocele repair N/A   . Breast biopsy N/A   . Leg surgery Left   . Tonsillectomy N/A    Family History  Problem Relation Age of Onset  . Breast cancer      great aunt and grandfather  . Lung cancer      uncle  . Hypertension Maternal Grandmother   . Heart disease Maternal Grandmother     MI 72s  . Diabetes Maternal Grandmother   . Arthritis Maternal Grandmother   . Diabetes Maternal Grandfather   . Ulcers Mother   . Ulcers Father    Social History   Social History  . Marital Status: Married    Spouse Name: N/A  . Number of Children: N/A  . Years of Education: N/A   Social History Main Topics  . Smoking status: Never Smoker   . Smokeless tobacco: Never Used  . Alcohol Use: No  . Drug Use: No  . Sexual Activity: Not Asked   Other Topics Concern  . None   Social History Narrative    Outpatient Encounter Prescriptions as of 10/27/2014  Medication Sig  . aspirin EC 81 MG tablet Take by mouth.  Marland Kitchen  azelastine (ASTELIN) 0.1 % nasal spray Place 1 spray into both nostrils 2 (two) times daily. Use in each nostril as directed  . calcium carbonate (TUMS - DOSED IN MG ELEMENTAL CALCIUM) 500 MG chewable tablet Chew 1 tablet by mouth as needed.  . cetirizine (ZYRTEC) 10 MG tablet Take by mouth.  . conjugated estrogens (PREMARIN) vaginal cream Place vaginally.  . Diphenhyd-Hydrocort-Nystatin (FIRST-DUKES MOUTHWASH) SUSP Swish and spit 5 mLs every 6 (six) hours as needed for Pain.  Marland Kitchen escitalopram (LEXAPRO) 20 MG tablet Take by mouth.  . fentaNYL (DURAGESIC) 100 MCG/HR Apply 1 patch to skin every 2 days with fentanyl patch 25 g per hour if tolerated  . fentaNYL (DURAGESIC) 25 MCG/HR patch Apply 1 patch to skin every 2 days with fentanyl patch 100 g per hour if tolerated  . fluticasone (FLONASE) 50 MCG/ACT nasal spray Place 2 sprays into both nostrils daily.  . furosemide (LASIX) 20 MG tablet Take 1 tablet (20 mg total) by mouth daily as needed.  .  Omega-3 Fatty Acids (FISH OIL) 1360 MG CAPS Take 1 capsule by mouth daily.  Marland Kitchen omeprazole (PRILOSEC) 20 MG capsule Take 1 capsule (20 mg total) by mouth 2 (two) times daily.  Marland Kitchen oxyCODONE (ROXICODONE) 5 MG immediate release tablet Limit 3-5 times per day if tolerated  . senna-docusate (SENOKOT-S) 8.6-50 MG per tablet Take by mouth.  . valACYclovir (VALTREX) 500 MG tablet Take 1 tablet (500 mg total) by mouth daily.  . [DISCONTINUED] doxycycline (VIBRA-TABS) 100 MG tablet Take 1 tablet (100 mg total) by mouth 2 (two) times daily.   No facility-administered encounter medications on file as of 10/27/2014.    Review of Systems  Constitutional: Negative for fever, appetite change and unexpected weight change.  HENT: Negative for congestion and sinus pressure.   Eyes: Negative for pain and visual disturbance.  Respiratory: Negative for cough, chest tightness and shortness of breath.   Cardiovascular: Negative for chest pain, palpitations and leg swelling.  Gastrointestinal: Negative for nausea, vomiting, abdominal pain and diarrhea.  Endocrine:       Night sweats as outlined.   Genitourinary: Negative for dysuria and difficulty urinating.  Musculoskeletal: Positive for back pain (chronic pain.  followed by pain clinic.  ). Negative for joint swelling.  Skin: Negative for color change and rash.  Neurological: Negative for dizziness, light-headedness and headaches.  Psychiatric/Behavioral: Negative for dysphoric mood and agitation.       Objective:    Physical Exam  Constitutional: She appears well-developed and well-nourished. No distress.  HENT:  Nose: Nose normal.  Mouth/Throat: Oropharynx is clear and moist.  Eyes: Conjunctivae are normal. Right eye exhibits no discharge. Left eye exhibits no discharge.  Neck: Neck supple. No thyromegaly present.  Cardiovascular: Normal rate and regular rhythm.   Pulmonary/Chest: Breath sounds normal. No respiratory distress. She has no wheezes.    Abdominal: Soft. Bowel sounds are normal. There is no tenderness.  Musculoskeletal: She exhibits no edema or tenderness.  Lymphadenopathy:    She has no cervical adenopathy.  Skin: No rash noted. No erythema.  Psychiatric: She has a normal mood and affect. Her behavior is normal.    BP 100/70 mmHg  Pulse 67  Temp(Src) 98.4 F (36.9 C) (Oral)  Resp 18  Ht 5' 7.5" (1.715 m)  Wt 184 lb (83.462 kg)  BMI 28.38 kg/m2  SpO2 96% Wt Readings from Last 3 Encounters:  10/27/14 184 lb (83.462 kg)  10/18/14 175 lb (79.379 kg)  10/14/14 175 lb (79.379  kg)     Lab Results  Component Value Date   WBC 11.4* 10/27/2014   HGB 11.9* 10/27/2014   HCT 36.1 10/27/2014   PLT 261.0 10/27/2014   GLUCOSE 92 10/27/2014   CHOL 208* 10/27/2014   TRIG 95.0 10/27/2014   HDL 55.10 10/27/2014   LDLDIRECT 164.2 02/11/2013   LDLCALC 134* 10/27/2014   ALT 30 10/27/2014   AST 24 10/27/2014   NA 142 10/27/2014   K 4.9 10/27/2014   CL 103 10/27/2014   CREATININE 0.79 10/27/2014   BUN 18 10/27/2014   CO2 36* 10/27/2014   TSH 2.01 10/27/2014   HGBA1C 5.7 10/27/2014       Assessment & Plan:   Problem List Items Addressed This Visit    Chronic back pain    Followed by pain clinic.  Last (recent) epidural - helped significantly.  Follow.      Fibromyalgia    Stable.       GERD (gastroesophageal reflux disease)    Symptoms controlled on omeprazole.  Follow.       Hypercholesterolemia    Low cholesterol diet and exercise.  Follow lipid panel.        Relevant Orders   Lipid panel (Completed)   Hepatic function panel (Completed)   IBS (irritable bowel syndrome)    Bowels stable.       Lymphoma    Just evaluated by Dr Leretha Pol.  Felt stable.  Did discuss her night sweats with him.  Felt stable.  Follow.        Relevant Orders   CBC with Differential/Platelet (Completed)   Basic metabolic panel (Completed)   Night sweats    Symptoms as outlined.  Mild.  Does not feel needs any  further intervention. States she discussed with her oncologist.  Felt lymphoma stable.  Check thyroid and sugar.        Relevant Orders   TSH (Completed)    Other Visit Diagnoses    Encounter for immunization    -  Primary    Screening breast examination        Relevant Orders    MM DIGITAL SCREENING BILATERAL    Hyperglycemia        Relevant Orders    Hemoglobin A1c (Completed)        Einar Pheasant, MD

## 2014-10-27 NOTE — Patient Instructions (Signed)

## 2014-10-28 ENCOUNTER — Encounter: Payer: Self-pay | Admitting: Internal Medicine

## 2014-10-28 NOTE — Assessment & Plan Note (Signed)
Low cholesterol diet and exercise.  Follow lipid panel.   

## 2014-10-28 NOTE — Assessment & Plan Note (Signed)
Just evaluated by Dr Leretha Pol.  Felt stable.  Did discuss her night sweats with him.  Felt stable.  Follow.

## 2014-10-28 NOTE — Assessment & Plan Note (Signed)
Bowels stable.  

## 2014-10-28 NOTE — Assessment & Plan Note (Signed)
Symptoms as outlined.  Mild.  Does not feel needs any further intervention. States she discussed with her oncologist.  Felt lymphoma stable.  Check thyroid and sugar.

## 2014-10-28 NOTE — Assessment & Plan Note (Signed)
Stable

## 2014-10-28 NOTE — Assessment & Plan Note (Signed)
Symptoms controlled on omeprazole.  Follow.  

## 2014-10-28 NOTE — Assessment & Plan Note (Signed)
Followed by pain clinic.  Last (recent) epidural - helped significantly.  Follow.

## 2014-11-11 ENCOUNTER — Encounter: Payer: Self-pay | Admitting: Pain Medicine

## 2014-11-11 ENCOUNTER — Other Ambulatory Visit (INDEPENDENT_AMBULATORY_CARE_PROVIDER_SITE_OTHER): Payer: Medicare Other

## 2014-11-11 ENCOUNTER — Ambulatory Visit: Payer: Medicare Other | Attending: Pain Medicine | Admitting: Pain Medicine

## 2014-11-11 ENCOUNTER — Other Ambulatory Visit: Payer: Self-pay | Admitting: *Deleted

## 2014-11-11 VITALS — BP 118/47 | HR 72 | Temp 98.7°F | Resp 16 | Wt 175.0 lb

## 2014-11-11 DIAGNOSIS — M5136 Other intervertebral disc degeneration, lumbar region: Secondary | ICD-10-CM | POA: Insufficient documentation

## 2014-11-11 DIAGNOSIS — M7060 Trochanteric bursitis, unspecified hip: Secondary | ICD-10-CM | POA: Insufficient documentation

## 2014-11-11 DIAGNOSIS — M79604 Pain in right leg: Secondary | ICD-10-CM | POA: Diagnosis present

## 2014-11-11 DIAGNOSIS — M47816 Spondylosis without myelopathy or radiculopathy, lumbar region: Secondary | ICD-10-CM | POA: Diagnosis not present

## 2014-11-11 DIAGNOSIS — D649 Anemia, unspecified: Secondary | ICD-10-CM

## 2014-11-11 DIAGNOSIS — M5416 Radiculopathy, lumbar region: Secondary | ICD-10-CM | POA: Diagnosis not present

## 2014-11-11 DIAGNOSIS — B0229 Other postherpetic nervous system involvement: Secondary | ICD-10-CM | POA: Diagnosis not present

## 2014-11-11 DIAGNOSIS — G8929 Other chronic pain: Secondary | ICD-10-CM

## 2014-11-11 DIAGNOSIS — M533 Sacrococcygeal disorders, not elsewhere classified: Secondary | ICD-10-CM | POA: Insufficient documentation

## 2014-11-11 DIAGNOSIS — M545 Low back pain: Secondary | ICD-10-CM

## 2014-11-11 DIAGNOSIS — M47817 Spondylosis without myelopathy or radiculopathy, lumbosacral region: Secondary | ICD-10-CM | POA: Diagnosis not present

## 2014-11-11 DIAGNOSIS — M791 Myalgia: Secondary | ICD-10-CM | POA: Diagnosis not present

## 2014-11-11 DIAGNOSIS — M79605 Pain in left leg: Secondary | ICD-10-CM | POA: Diagnosis present

## 2014-11-11 DIAGNOSIS — M461 Sacroiliitis, not elsewhere classified: Secondary | ICD-10-CM | POA: Diagnosis not present

## 2014-11-11 LAB — IBC PANEL
IRON: 52 ug/dL (ref 42–145)
Saturation Ratios: 11.5 % — ABNORMAL LOW (ref 20.0–50.0)
TRANSFERRIN: 322 mg/dL (ref 212.0–360.0)

## 2014-11-11 LAB — CBC WITH DIFFERENTIAL/PLATELET
BASOS ABS: 0 10*3/uL (ref 0.0–0.1)
BASOS PCT: 0.4 % (ref 0.0–3.0)
EOS ABS: 0.1 10*3/uL (ref 0.0–0.7)
Eosinophils Relative: 1.8 % (ref 0.0–5.0)
HCT: 34.2 % — ABNORMAL LOW (ref 36.0–46.0)
Hemoglobin: 11.3 g/dL — ABNORMAL LOW (ref 12.0–15.0)
Lymphocytes Relative: 42.7 % (ref 12.0–46.0)
Lymphs Abs: 3.4 10*3/uL (ref 0.7–4.0)
MCHC: 33.1 g/dL (ref 30.0–36.0)
MCV: 81.4 fl (ref 78.0–100.0)
MONO ABS: 0.6 10*3/uL (ref 0.1–1.0)
Monocytes Relative: 7.2 % (ref 3.0–12.0)
NEUTROS ABS: 3.9 10*3/uL (ref 1.4–7.7)
NEUTROS PCT: 47.9 % (ref 43.0–77.0)
PLATELETS: 216 10*3/uL (ref 150.0–400.0)
RBC: 4.21 Mil/uL (ref 3.87–5.11)
RDW: 16.1 % — AB (ref 11.5–15.5)
WBC: 8.1 10*3/uL (ref 4.0–10.5)

## 2014-11-11 LAB — FERRITIN: Ferritin: 12 ng/mL (ref 10.0–291.0)

## 2014-11-11 MED ORDER — FENTANYL 25 MCG/HR TD PT72: MEDICATED_PATCH | TRANSDERMAL | Status: DC

## 2014-11-11 MED ORDER — FENTANYL 100 MCG/HR TD PT72: MEDICATED_PATCH | TRANSDERMAL | Status: DC

## 2014-11-11 MED ORDER — INTEGRA 62.5-62.5-40-3 MG PO CAPS: 1.0000 | ORAL_CAPSULE | Freq: Every day | ORAL | Status: DC

## 2014-11-11 NOTE — Progress Notes (Signed)
   Subjective:    Patient ID: Maria Keller, female    DOB: [birth year], 64 y.o.   MRN: [id number]  HPI Patient is 64 year old female returns to [facility] for further evaluation and treatment of pain involving the region of the lower back and lower extremity region. Patient states that she has had significant improvement of her pain following previous lumbar epidural steroid injection. Patient states that the pain that it radiating from the lower back to the lower extremity is no longer present. Patient denies any significant muscle spasms or weakness of the lower back or extremities. We discussed patient's condition patient states that she is without need for Roxicodone and we will continue fentanyl patches as prescribed. The patient was understanding and in agreement status treatment plan as stated that she was quite pleased with the amount of relief of pain she was experiencing since her procedure. We will remain available to consider modification of treatment pending follow-up evaluation. The patient was understanding and in agreement with suggested treatment plan   Review of Systems     Objective:   Physical Exam  There was tenderness over the splenius capitis and occipitalis region of mild degree. There was mild tends of the cervical facet cervical paraspinal musculature region. Palpation of the acromioclavicular and glenohumeral joint regions reproduced mild discomfort. There was unremarkable Spurling's maneuver. Tinel and Phalen's maneuver without increased pain of significant degree. There was mild tenderness over the thoracic facet thoracic paraspinal musculature region. Palpation over the region of the lumbar paraspinal muscles region lumbar facet region associated with mild to moderate discomfort. Lateral bending and rotation was with mild discomfort. Palpation of the lumbar facets reproduce mild discomfort. Straight leg raising tolerated to 30 without increased pain with  dorsiflexion noted. There was negative clonus negative Homans area there was no sensory deficit of dermatomal distribution detected. Palpation of the greater trochanteric region was with without significant increased pain and there was minimal tends to palpation over the PSIS PII S region gluteal and piriformis musculature regions. Patient able to stand on tiptoes and heels without significant difficulty. Abdomen was nontender with no costovertebral tenderness noted.      Assessment & Plan:    Degenerative disc disease lumbar spine L4-L5 level degenerative changes most significant with multilevel degenerative changes noted throughout the lumbar spine  Lumbar radiculopathy  Postherpetic neuralgia thoracic region  Sacroiliac joint dysfunction  Greater trochanteric bursitis     PLAN   Continue present medication fentanyl patches. Patient is without the need for oxycodone since undergoing her lumbar epidural steroid injection which provided excellent relief of lumbar and lower extremity pain and paresthesias   F/U PCP Dr. Ronda Fairly for evaliation of  BP and general medical  condition  F/U surgical evaluation. May consider pending follow-up evaluations  F/U rheumatological evaluation  F/U neurological evaluation. May consider pending follow-up evaluations  May consider radiofrequency rhizolysis or intraspinal procedures pending response to present treatment and F/U evaluation   Patient to call Pain Management Center should patient have concerns prior to scheduled return appointment.

## 2014-11-11 NOTE — Patient Instructions (Signed)
PLAN   Continue present medication fentanyl patches and take oxycodone and Zanaflex as needed  F/U PCP Dr. Einar Pheasant for evaliation of  BP and general medical condition today as planned  F/U surgical evaluation. May consider pending follow-up evaluations  F/U neurological evaluation. May consider pending follow-up evaluations  F/U  [facility] as planned  May consider radiofrequency rhizolysis or intraspinal procedures pending response to present treatment and F/U evaluation   Patient to call Pain Management Center should patient have concerns prior to scheduled return appointment.

## 2014-11-11 NOTE — Progress Notes (Signed)
Safety precautions to be maintained throughout the outpatient stay will include: orient to surroundings, keep bed in low position, maintain call bell within reach at all times, provide assistance with transfer out of bed and ambulation.  

## 2014-11-15 ENCOUNTER — Encounter: Payer: Medicare Other | Admitting: Pain Medicine

## 2014-11-16 DIAGNOSIS — Z1231 Encounter for screening mammogram for malignant neoplasm of breast: Secondary | ICD-10-CM | POA: Diagnosis not present

## 2014-11-16 LAB — HM MAMMOGRAPHY

## 2014-11-17 ENCOUNTER — Encounter: Payer: Self-pay | Admitting: Internal Medicine

## 2014-12-09 ENCOUNTER — Other Ambulatory Visit: Payer: Medicare Other

## 2014-12-10 ENCOUNTER — Telehealth: Payer: Self-pay | Admitting: *Deleted

## 2014-12-10 ENCOUNTER — Other Ambulatory Visit (INDEPENDENT_AMBULATORY_CARE_PROVIDER_SITE_OTHER): Payer: Medicare Other

## 2014-12-10 DIAGNOSIS — D509 Iron deficiency anemia, unspecified: Secondary | ICD-10-CM

## 2014-12-10 LAB — FERRITIN: FERRITIN: 15.8 ng/mL (ref 10.0–291.0)

## 2014-12-10 LAB — CBC WITH DIFFERENTIAL/PLATELET
BASOS ABS: 0 10*3/uL (ref 0.0–0.1)
Basophils Relative: 0.6 % (ref 0.0–3.0)
EOS ABS: 0.2 10*3/uL (ref 0.0–0.7)
Eosinophils Relative: 1.9 % (ref 0.0–5.0)
HEMATOCRIT: 34.8 % — AB (ref 36.0–46.0)
Hemoglobin: 11.2 g/dL — ABNORMAL LOW (ref 12.0–15.0)
LYMPHS ABS: 3.7 10*3/uL (ref 0.7–4.0)
LYMPHS PCT: 45.9 % (ref 12.0–46.0)
MCHC: 32.3 g/dL (ref 30.0–36.0)
MCV: 82.7 fl (ref 78.0–100.0)
MONO ABS: 0.7 10*3/uL (ref 0.1–1.0)
Monocytes Relative: 8.1 % (ref 3.0–12.0)
NEUTROS ABS: 3.5 10*3/uL (ref 1.4–7.7)
NEUTROS PCT: 43.5 % (ref 43.0–77.0)
Platelets: 186 10*3/uL (ref 150.0–400.0)
RBC: 4.21 Mil/uL (ref 3.87–5.11)
RDW: 16.3 % — AB (ref 11.5–15.5)
WBC: 8.1 10*3/uL (ref 4.0–10.5)

## 2014-12-10 LAB — IBC PANEL
IRON: 49 ug/dL (ref 42–145)
Saturation Ratios: 12.3 % — ABNORMAL LOW (ref 20.0–50.0)
TRANSFERRIN: 285 mg/dL (ref 212.0–360.0)

## 2014-12-10 NOTE — Telephone Encounter (Signed)
Order placed for cbc and iron studies.   

## 2014-12-10 NOTE — Telephone Encounter (Signed)
Labs and dx?  

## 2014-12-14 ENCOUNTER — Encounter: Payer: Self-pay | Admitting: Pain Medicine

## 2014-12-14 ENCOUNTER — Ambulatory Visit: Payer: Medicare Other | Attending: Pain Medicine | Admitting: Pain Medicine

## 2014-12-14 VITALS — BP 101/37 | HR 73 | Temp 98.0°F | Resp 18 | Ht 67.5 in | Wt 175.0 lb

## 2014-12-14 DIAGNOSIS — M7060 Trochanteric bursitis, unspecified hip: Secondary | ICD-10-CM | POA: Diagnosis not present

## 2014-12-14 DIAGNOSIS — M5136 Other intervertebral disc degeneration, lumbar region: Secondary | ICD-10-CM | POA: Diagnosis not present

## 2014-12-14 DIAGNOSIS — G8929 Other chronic pain: Secondary | ICD-10-CM

## 2014-12-14 DIAGNOSIS — M542 Cervicalgia: Secondary | ICD-10-CM | POA: Diagnosis present

## 2014-12-14 DIAGNOSIS — M546 Pain in thoracic spine: Secondary | ICD-10-CM | POA: Diagnosis present

## 2014-12-14 DIAGNOSIS — M5416 Radiculopathy, lumbar region: Secondary | ICD-10-CM

## 2014-12-14 DIAGNOSIS — M791 Myalgia: Secondary | ICD-10-CM | POA: Diagnosis not present

## 2014-12-14 DIAGNOSIS — M47896 Other spondylosis, lumbar region: Secondary | ICD-10-CM | POA: Diagnosis not present

## 2014-12-14 DIAGNOSIS — B0229 Other postherpetic nervous system involvement: Secondary | ICD-10-CM | POA: Insufficient documentation

## 2014-12-14 DIAGNOSIS — M47816 Spondylosis without myelopathy or radiculopathy, lumbar region: Secondary | ICD-10-CM

## 2014-12-14 DIAGNOSIS — M461 Sacroiliitis, not elsewhere classified: Secondary | ICD-10-CM | POA: Diagnosis not present

## 2014-12-14 DIAGNOSIS — M79604 Pain in right leg: Secondary | ICD-10-CM

## 2014-12-14 DIAGNOSIS — M533 Sacrococcygeal disorders, not elsewhere classified: Secondary | ICD-10-CM | POA: Diagnosis not present

## 2014-12-14 DIAGNOSIS — M47817 Spondylosis without myelopathy or radiculopathy, lumbosacral region: Secondary | ICD-10-CM | POA: Diagnosis not present

## 2014-12-14 DIAGNOSIS — M545 Low back pain: Secondary | ICD-10-CM

## 2014-12-14 MED ORDER — FENTANYL 25 MCG/HR TD PT72: MEDICATED_PATCH | TRANSDERMAL | Status: DC

## 2014-12-14 MED ORDER — FENTANYL 100 MCG/HR TD PT72: MEDICATED_PATCH | TRANSDERMAL | Status: DC

## 2014-12-14 NOTE — Progress Notes (Signed)
Safety precautions to be maintained throughout the outpatient stay will include: orient to surroundings, keep bed in low position, maintain call bell within reach at all times, provide assistance with transfer out of bed and ambulation.  

## 2014-12-14 NOTE — Progress Notes (Signed)
   Subjective:    Patient ID: Maria Keller, female    DOB: [birth year], 64 y.o.   MRN: [id number]  HPI  The patient is a 64 year old female who returns to pain management Center for further evaluation and treatment of pain involving the region of the neck and Back upper and lower extremity regions with most recent pain involving the lower back and lower extremity region which resolved following interventional treatment in pain management Center. At the present time patient denies any significant pain involving the thoracic or lumbar regions as well as the cervical region. The patient is a history of shingles of the thoracic region which had been significant contributor to patient's pain. At the present time patient's pain is well controlled and patient is with use of fentanyl patches without the use of Zanaflex or oxycodone. We will continue present fentanyl patches medication and we'll remain available to consider modifications of treatment effect. To be necessary. The patient was in agreement with suggested treatment plan    Review of Systems     Objective:   Physical Exam   There was mild tenderness to palpation of the splenius capitis and occipitalis musculature regions. Palpation of the acromioclavicular and glenohumeral joint regions reproduces minimal discomfort. There was unremarkable Spurling's maneuver. Tinel and Phalen maneuver without increased pain there was minimal tenderness to palpation of the thoracic facet thoracic paraspinal musculature region with no excessive tends to palpation in the thoracic region on the right in the area of previous shingles palpation over the lumbar paraspinal muscles lumbar facet region was minimal tenderness to palpation. Lateral bending and rotation extension and palpation of the lumbar facets reproduces minimal discomfort. Straight leg raising was tolerated to 30 without increased pain with dorsiflexion noted. No sensory deficit or dermatomal  distribution was detected. DTRs appeared to be trace at the knees. Negative clonus negative Homans. Abdomen nontender and no costovertebral tenderness noted.      Assessment & Plan:     Degenerative disc disease lumbar spine L4-L5 level degenerative changes most significant with multilevel degenerative changes noted throughout the lumbar spine  Lumbar radiculopathy  Postherpetic neuralgia thoracic region  Sacroiliac joint dysfunction  Greater trochanteric bursitis    PLAN     Continue present medication fentanyl patches.   F/U PCP Dr. Ronda Fairly for evaliation of  BP and general medical  condition  F/U surgical evaluation. May consider pending follow-up evaluations. Patient continues to do well status post interventional treatment and will avoid surgical evaluation at this time  F/U rheumatological evaluation  F/U neurological evaluation. May consider pending follow-up evaluations  F/U psych evaluation  May consider radiofrequency rhizolysis or intraspinal procedures pending response to present treatment and F/U evaluation   Patient to call Pain Management Center should patient have concerns prior to scheduled return appointment.

## 2014-12-14 NOTE — Patient Instructions (Addendum)
PLAN   Continue present medication fentanyl patches  May take oxycodone and Zanaflex as needed  F/U PCP Dr. Einar Pheasant for evaliation of  BP and general medical condition today as planned  F/U surgical evaluation. May consider pending follow-up evaluations We will avoid at this time   F/U neurological evaluation. May consider pending follow-up evaluations We will avoid PNCV/EMG studies and other studies at this time  F/U  [facility] as planned  May consider radiofrequency rhizolysis or intraspinal procedures pending response to present treatment and F/U evaluation

## 2014-12-20 ENCOUNTER — Other Ambulatory Visit: Payer: Self-pay | Admitting: Internal Medicine

## 2014-12-20 DIAGNOSIS — D509 Iron deficiency anemia, unspecified: Secondary | ICD-10-CM

## 2014-12-20 NOTE — Progress Notes (Signed)
Order placed for cbc and ferritin.   

## 2014-12-21 ENCOUNTER — Other Ambulatory Visit: Payer: Self-pay | Admitting: Internal Medicine

## 2014-12-21 DIAGNOSIS — D509 Iron deficiency anemia, unspecified: Secondary | ICD-10-CM

## 2014-12-21 NOTE — Progress Notes (Signed)
Order placed for GI referral.  See 12/2014 result note.

## 2014-12-23 ENCOUNTER — Telehealth: Payer: Self-pay | Admitting: Pain Medicine

## 2014-12-23 NOTE — Telephone Encounter (Signed)
Patient not at home. Left message with husband for her to call us for clarification so that we can help her with this.

## 2014-12-23 NOTE — Telephone Encounter (Signed)
Maria Keller at [facility] needs additional information for Maria Keller / please call [phone]

## 2014-12-27 ENCOUNTER — Other Ambulatory Visit: Payer: Self-pay | Admitting: Pain Medicine

## 2014-12-27 ENCOUNTER — Telehealth: Payer: Self-pay | Admitting: *Deleted

## 2014-12-27 NOTE — Telephone Encounter (Signed)
Called patient to obtain info for prior auth for fentanyl patch. Patient has decided to go with a different insurance company [facility]) and has rquested that we not persue prior auth at this time

## 2015-01-05 ENCOUNTER — Telehealth: Payer: Self-pay | Admitting: *Deleted

## 2015-01-05 NOTE — Telephone Encounter (Signed)
Patient stated that Dr. Bridgett Larsson has closed his practice and  prescribes her lexapro. Patient questioned if Dr. Nicki Reaper could prescribe this medication when needed.

## 2015-01-11 ENCOUNTER — Encounter: Payer: Self-pay | Admitting: Pain Medicine

## 2015-01-11 ENCOUNTER — Ambulatory Visit: Payer: Medicare Other | Attending: Pain Medicine | Admitting: Pain Medicine

## 2015-01-11 VITALS — BP 114/59 | HR 75 | Temp 98.3°F | Resp 18 | Ht 67.0 in | Wt 175.0 lb

## 2015-01-11 DIAGNOSIS — M79604 Pain in right leg: Secondary | ICD-10-CM

## 2015-01-11 DIAGNOSIS — M7060 Trochanteric bursitis, unspecified hip: Secondary | ICD-10-CM | POA: Insufficient documentation

## 2015-01-11 DIAGNOSIS — M47816 Spondylosis without myelopathy or radiculopathy, lumbar region: Secondary | ICD-10-CM

## 2015-01-11 DIAGNOSIS — M47817 Spondylosis without myelopathy or radiculopathy, lumbosacral region: Secondary | ICD-10-CM | POA: Diagnosis not present

## 2015-01-11 DIAGNOSIS — M545 Low back pain: Secondary | ICD-10-CM

## 2015-01-11 DIAGNOSIS — M47896 Other spondylosis, lumbar region: Secondary | ICD-10-CM | POA: Diagnosis not present

## 2015-01-11 DIAGNOSIS — M5416 Radiculopathy, lumbar region: Secondary | ICD-10-CM | POA: Diagnosis not present

## 2015-01-11 DIAGNOSIS — M542 Cervicalgia: Secondary | ICD-10-CM | POA: Diagnosis present

## 2015-01-11 DIAGNOSIS — B0229 Other postherpetic nervous system involvement: Secondary | ICD-10-CM

## 2015-01-11 DIAGNOSIS — M5116 Intervertebral disc disorders with radiculopathy, lumbar region: Secondary | ICD-10-CM | POA: Diagnosis not present

## 2015-01-11 DIAGNOSIS — M5136 Other intervertebral disc degeneration, lumbar region: Secondary | ICD-10-CM

## 2015-01-11 DIAGNOSIS — M533 Sacrococcygeal disorders, not elsewhere classified: Secondary | ICD-10-CM | POA: Diagnosis not present

## 2015-01-11 DIAGNOSIS — M791 Myalgia: Secondary | ICD-10-CM | POA: Diagnosis not present

## 2015-01-11 DIAGNOSIS — M549 Dorsalgia, unspecified: Secondary | ICD-10-CM | POA: Diagnosis present

## 2015-01-11 DIAGNOSIS — G8929 Other chronic pain: Secondary | ICD-10-CM

## 2015-01-11 DIAGNOSIS — M461 Sacroiliitis, not elsewhere classified: Secondary | ICD-10-CM | POA: Diagnosis not present

## 2015-01-11 MED ORDER — TIZANIDINE HCL 2 MG PO CAPS: ORAL_CAPSULE | ORAL | Status: DC

## 2015-01-11 MED ORDER — FENTANYL 100 MCG/HR TD PT72: MEDICATED_PATCH | TRANSDERMAL | Status: DC

## 2015-01-11 MED ORDER — FENTANYL 25 MCG/HR TD PT72: MEDICATED_PATCH | TRANSDERMAL | Status: DC

## 2015-01-11 NOTE — Progress Notes (Signed)
   Subjective:    Patient ID: Maria Keller, female    DOB: [birth year], 64 y.o.   MRN: [id number]  HPI  The patient is a 64 year old female who returns to pain management Center for further evaluation and treatment of pain involving the neck entire back upper and lower extremity regions. Patient states the pain is well-controlled at this time. Patient has had significant pain of the lumbar lower extremity region as well as pain due to shingles involving the thoracic region on the right as well as greater trochanteric bursitis and sacroiliac joint dysfunction. Patient has had interventional treatment as well as modification of medications and at the present time continues to be with pain well-controlled. We will avoid interventional treatment and continue not interventional treatment measures at this time. The patient was in agreement with suggested treatment plan.   Review of Systems     Objective:   Physical Exam  There was tends to palpation of the splenius capitis and occipitalis musculature region. Palpation of which reproduced pain of minimal degree. There was minimal tense to palpation of the acromioclavicular and glenohumeral joint region and patient appeared to be with bilaterally equal grip strength with Tinel and Phalen's maneuver reproducing minimal discomfort. There was minimal tense to palpation over the thoracic facet thoracic paraspinal musculature region. Patient was at unremarkable Spurling's maneuver there was minimal tense to palpation of the thoracic region thoracic paraspinal musculature region especially right thoracic region in areas of previous shingles.. Palpation over the region of the lumbar paraspinal musculature region lumbar facet region was attends to palpation of minimal degree. Lateral bending rotation extension and palpation of the lumbar facets reproduced minimal discomfort. Straight leg raising was tolerates approximately 30 without increased pain with dorsiflexion  noted. No sensory deficit of dermatomal dystrophy detected. DTRs appeared to be trace at the knees. There was negative clonus negative Homans. There was minimal tenderness to palpation over the PSIS and PII S region with moderate tenderness to palpation of the greater trochanteric region and iliotibial band region. Nontender with no costovertebral tenderness noted.      Assessment & Plan:     Degenerative disc disease lumbar spine L4-L5 level degenerative changes most significant with multilevel degenerative changes noted throughout the lumbar spine  Lumbar radiculopathy  Postherpetic neuralgia thoracic region  Sacroiliac joint dysfunction  Greater trochanteric bursitis    PLAN    Continue present medication fentanyl patches  May take oxycodone and Zanaflex as needed  F/U PCP Dr. Einar Pheasant for evaliation of  BP and general medical condition today as planned  F/U surgical evaluation. May consider pending follow-up evaluations We will avoid at this time   F/U neurological evaluation. May consider pending follow-up evaluations We will avoid PNCV/EMG studies and other studies at this time  F/U  [facility] as planned  F/U [facility] appointment as needed  May consider radiofrequency rhizolysis or intraspinal procedures pending response to present treatment and F/U evaluation

## 2015-01-11 NOTE — Progress Notes (Signed)
Safety precautions to be maintained throughout the outpatient stay will include: orient to surroundings, keep bed in low position, maintain call bell within reach at all times, provide assistance with transfer out of bed and ambulation.  

## 2015-01-11 NOTE — Patient Instructions (Addendum)
PLAN   Continue present medication fentanyl patches  May take oxycodone and Zanaflex as needed  F/U PCP Dr. Einar Pheasant for evaliation of  BP and general medical condition today as planned  F/U surgical evaluation. May consider pending follow-up evaluations We will avoid at this time   F/U neurological evaluation. May consider pending follow-up evaluations We will avoid PNCV/EMG studies and other studies at this time  F/U  [facility] as planned  F/U [facility] appointment as needed  May consider radiofrequency rhizolysis or intraspinal procedures pending response to present treatment and F/U evaluation

## 2015-01-19 ENCOUNTER — Other Ambulatory Visit (INDEPENDENT_AMBULATORY_CARE_PROVIDER_SITE_OTHER): Payer: Medicare Other

## 2015-01-19 DIAGNOSIS — D509 Iron deficiency anemia, unspecified: Secondary | ICD-10-CM

## 2015-01-19 LAB — CBC WITH DIFFERENTIAL/PLATELET
Basophils Absolute: 0 10*3/uL (ref 0.0–0.1)
Basophils Relative: 0.4 % (ref 0.0–3.0)
EOS PCT: 2.4 % (ref 0.0–5.0)
Eosinophils Absolute: 0.2 10*3/uL (ref 0.0–0.7)
HEMATOCRIT: 35.7 % — AB (ref 36.0–46.0)
Hemoglobin: 11.7 g/dL — ABNORMAL LOW (ref 12.0–15.0)
LYMPHS ABS: 3.8 10*3/uL (ref 0.7–4.0)
LYMPHS PCT: 42.4 % (ref 12.0–46.0)
MCHC: 32.8 g/dL (ref 30.0–36.0)
MCV: 80.6 fl (ref 78.0–100.0)
MONOS PCT: 8.1 % (ref 3.0–12.0)
Monocytes Absolute: 0.7 10*3/uL (ref 0.1–1.0)
NEUTROS ABS: 4.2 10*3/uL (ref 1.4–7.7)
NEUTROS PCT: 46.7 % (ref 43.0–77.0)
PLATELETS: 195 10*3/uL (ref 150.0–400.0)
RBC: 4.42 Mil/uL (ref 3.87–5.11)
RDW: 14.9 % (ref 11.5–15.5)
WBC: 9 10*3/uL (ref 4.0–10.5)

## 2015-01-19 LAB — FERRITIN: Ferritin: 13.6 ng/mL (ref 10.0–291.0)

## 2015-01-20 ENCOUNTER — Encounter: Payer: Self-pay | Admitting: *Deleted

## 2015-02-09 ENCOUNTER — Encounter: Payer: Self-pay | Admitting: Pain Medicine

## 2015-02-09 ENCOUNTER — Ambulatory Visit: Payer: Medicare Other | Attending: Pain Medicine | Admitting: Pain Medicine

## 2015-02-09 VITALS — BP 105/67 | HR 79 | Temp 98.1°F | Resp 16 | Ht 67.0 in | Wt 175.0 lb

## 2015-02-09 DIAGNOSIS — M545 Low back pain, unspecified: Secondary | ICD-10-CM

## 2015-02-09 DIAGNOSIS — R21 Rash and other nonspecific skin eruption: Secondary | ICD-10-CM

## 2015-02-09 DIAGNOSIS — M533 Sacrococcygeal disorders, not elsewhere classified: Secondary | ICD-10-CM

## 2015-02-09 DIAGNOSIS — M7060 Trochanteric bursitis, unspecified hip: Secondary | ICD-10-CM | POA: Insufficient documentation

## 2015-02-09 DIAGNOSIS — G8929 Other chronic pain: Secondary | ICD-10-CM

## 2015-02-09 DIAGNOSIS — M791 Myalgia: Secondary | ICD-10-CM | POA: Diagnosis not present

## 2015-02-09 DIAGNOSIS — C8590 Non-Hodgkin lymphoma, unspecified, unspecified site: Secondary | ICD-10-CM | POA: Diagnosis not present

## 2015-02-09 DIAGNOSIS — B0229 Other postherpetic nervous system involvement: Secondary | ICD-10-CM | POA: Diagnosis not present

## 2015-02-09 DIAGNOSIS — M5116 Intervertebral disc disorders with radiculopathy, lumbar region: Secondary | ICD-10-CM | POA: Insufficient documentation

## 2015-02-09 DIAGNOSIS — M5416 Radiculopathy, lumbar region: Secondary | ICD-10-CM

## 2015-02-09 DIAGNOSIS — M47816 Spondylosis without myelopathy or radiculopathy, lumbar region: Secondary | ICD-10-CM

## 2015-02-09 DIAGNOSIS — M79604 Pain in right leg: Secondary | ICD-10-CM

## 2015-02-09 DIAGNOSIS — M542 Cervicalgia: Secondary | ICD-10-CM | POA: Diagnosis present

## 2015-02-09 DIAGNOSIS — M5136 Other intervertebral disc degeneration, lumbar region: Secondary | ICD-10-CM

## 2015-02-09 DIAGNOSIS — M546 Pain in thoracic spine: Secondary | ICD-10-CM | POA: Diagnosis present

## 2015-02-09 DIAGNOSIS — M51369 Other intervertebral disc degeneration, lumbar region without mention of lumbar back pain or lower extremity pain: Secondary | ICD-10-CM

## 2015-02-09 DIAGNOSIS — M47817 Spondylosis without myelopathy or radiculopathy, lumbosacral region: Secondary | ICD-10-CM | POA: Diagnosis not present

## 2015-02-09 DIAGNOSIS — M461 Sacroiliitis, not elsewhere classified: Secondary | ICD-10-CM | POA: Diagnosis not present

## 2015-02-09 MED ORDER — FENTANYL 25 MCG/HR TD PT72: MEDICATED_PATCH | TRANSDERMAL | Status: DC

## 2015-02-09 MED ORDER — FENTANYL 100 MCG/HR TD PT72: MEDICATED_PATCH | TRANSDERMAL | Status: DC

## 2015-02-09 NOTE — Progress Notes (Signed)
Safety precautions to be maintained throughout the outpatient stay will include: orient to surroundings, keep bed in low position, maintain call bell within reach at all times, provide assistance with transfer out of bed and ambulation.  

## 2015-02-09 NOTE — Progress Notes (Signed)
   Subjective:    Patient ID: Maria Keller, female    DOB: 1950/12/28, 65 y.o.   MRN: [id number]  HPI  The patient is a 64 year old female who returns to pain management for further evaluation and treatment of pain involving the neck thoracic region and lumbar region. The patient states she is doing remarkably well at this time and is without need for Zanaflex oxycodone. The patient will continue the use of fentanyl patches. The patient denies any change in symptoms and states that she is doing very well at this time and is without interference of significant pain when performing activities of daily living all when attempting to obtain restful sleep. We will avoid interventional treatment and continue fentanyl patches as prescribed. The patient is to call pain management should they be change in condition and we will consider modifications of treatment regimen including interventional treatment as discussed. The patient was in agreement with suggested treatment plan     Review of Systems     Objective:   Physical Exam  There was minimal tense to palpation of paraspinal muscular region cervical region cervical facet region with minimal tenderness of the splenius capitis and occipitalis musculature regions. It was minimal tenderness over the region of the cervical facet cervical paraspinal musculature region as well as the thoracic facet and thoracic paraspinal musculature region with no crepitus of the thoracic region noted. The patient appeared to be with bilaterally equal grip strength. There was minimal tense to palpation of the right thoracic region in the area of previous shingles. There was tenderness to palpation over the lumbar paraspinal must reason lumbar facet region a mild degree with lateral bending rotation extension and palpation of the lumbar facets reproducing minimal discomfort. Straight leg raise was tolerates approximately 30 without increased pain with dorsiflexion noted. There  was mild tenderness to palpation over the PSIS and PII S region and mild to moderate tenderness to palpation over the greater trochanteric region and iliotibial band region. EHL strength appeared to be decreased. There was no definite sensory deficit or dermatomal distribution detected. There was negative clonus and negative Homans. Abdomen is nontender with no costovertebral tenderness noted.      Assessment & Plan:    Degenerative disc disease lumbar spine L4-L5 level degenerative changes most significant with multilevel degenerative changes noted throughout the lumbar spine  Lumbar radiculopathy  Postherpetic neuralgia thoracic region  Sacroiliac joint dysfunction  Greater trochanteric bursitis  Lymphoma   PLAN   Continue present medication fentanyl patches  May take oxycodone and Zanaflex as needed  F/U PCP Dr. Einar Pheasant for evaliation of  BP and general medical condition today as planned. Please see Dr. Nicki Reaper this week for evaluation of elevated blood pressure as discussed today  F/U surgical evaluation. May consider pending follow-up evaluations We will avoid at this time   F/U neurological evaluation. May consider pending follow-up evaluations We will avoid PNCV/EMG studies and other studies at this time  F/U  [facility] as planned  F/U [facility] appointment as needed  May consider radiofrequency rhizolysis or intraspinal procedures pending response to present treatment and F/U evaluation   Please call pain management for any concerns regarding your condition prior to scheduled return appointment

## 2015-02-09 NOTE — Patient Instructions (Signed)
PLAN   Continue present medication fentanyl patches  May take oxycodone and Zanaflex as needed  F/U PCP Dr. Einar Pheasant for evaliation of  BP and general medical condition today as planned. Please see Dr. Nicki Reaper this week for evaluation of elevated blood pressure as discussed  F/U surgical evaluation. May consider pending follow-up evaluations We will avoid at this time   F/U neurological evaluation. May consider pending follow-up evaluations We will avoid PNCV/EMG studies and other studies at this time  F/U  [facility] as planned  F/U [facility] appointment as needed  May consider radiofrequency rhizolysis or intraspinal procedures pending response to present treatment and F/U evaluation   Please call pain management for any concerns regarding your condition prior to scheduled return appointment

## 2015-02-10 ENCOUNTER — Encounter: Payer: Medicare Other | Admitting: Pain Medicine

## 2015-02-28 DIAGNOSIS — D509 Iron deficiency anemia, unspecified: Secondary | ICD-10-CM | POA: Diagnosis not present

## 2015-03-02 ENCOUNTER — Encounter: Payer: Self-pay | Admitting: Internal Medicine

## 2015-03-02 ENCOUNTER — Ambulatory Visit (INDEPENDENT_AMBULATORY_CARE_PROVIDER_SITE_OTHER): Payer: Medicare Other | Admitting: Internal Medicine

## 2015-03-02 VITALS — BP 110/70 | HR 83 | Temp 98.3°F | Resp 18 | Ht 67.0 in | Wt 188.2 lb

## 2015-03-02 DIAGNOSIS — R61 Generalized hyperhidrosis: Secondary | ICD-10-CM | POA: Diagnosis not present

## 2015-03-02 DIAGNOSIS — M797 Fibromyalgia: Secondary | ICD-10-CM | POA: Diagnosis not present

## 2015-03-02 DIAGNOSIS — C8580 Other specified types of non-Hodgkin lymphoma, unspecified site: Secondary | ICD-10-CM | POA: Diagnosis not present

## 2015-03-02 DIAGNOSIS — M545 Low back pain, unspecified: Secondary | ICD-10-CM

## 2015-03-02 DIAGNOSIS — Z0000 Encounter for general adult medical examination without abnormal findings: Secondary | ICD-10-CM

## 2015-03-02 DIAGNOSIS — C8238 Follicular lymphoma grade IIIa, lymph nodes of multiple sites: Secondary | ICD-10-CM | POA: Diagnosis not present

## 2015-03-02 DIAGNOSIS — K219 Gastro-esophageal reflux disease without esophagitis: Secondary | ICD-10-CM | POA: Diagnosis not present

## 2015-03-02 DIAGNOSIS — E7800 Pure hypercholesterolemia, unspecified: Secondary | ICD-10-CM

## 2015-03-02 DIAGNOSIS — Z23 Encounter for immunization: Secondary | ICD-10-CM | POA: Diagnosis not present

## 2015-03-02 LAB — BASIC METABOLIC PANEL
BUN: 31 mg/dL — AB (ref 4–21)
Creatinine: 10 mg/dL — AB (ref 0.5–1.1)
Glucose: 128 mg/dL
Potassium: 3.4 mmol/L (ref 3.4–5.3)
Sodium: 139 mmol/L (ref 137–147)

## 2015-03-02 LAB — CBC AND DIFFERENTIAL
HEMATOCRIT: 36 % (ref 36–46)
HEMOGLOBIN: 11.4 g/dL — AB (ref 12.0–16.0)
NEUTROS ABS: 4 /uL
PLATELETS: 185 10*3/uL (ref 150–399)
WBC: 9.5 10^3/mL

## 2015-03-02 LAB — HEPATIC FUNCTION PANEL
ALT: 41 U/L — AB (ref 7–35)
AST: 49 U/L — AB (ref 13–35)
Alkaline Phosphatase: 78 U/L (ref 25–125)

## 2015-03-02 MED ORDER — MUPIROCIN 2 % EX OINT: TOPICAL_OINTMENT | CUTANEOUS | Status: DC

## 2015-03-02 MED ORDER — INTEGRA 62.5-62.5-40-3 MG PO CAPS: 1.0000 | ORAL_CAPSULE | Freq: Every day | ORAL | Status: DC

## 2015-03-02 MED ORDER — OMEPRAZOLE 20 MG PO CPDR: 20.0000 mg | DELAYED_RELEASE_CAPSULE | Freq: Two times a day (BID) | ORAL | Status: DC

## 2015-03-02 MED ORDER — VALACYCLOVIR HCL 500 MG PO TABS: 500.0000 mg | ORAL_TABLET | Freq: Every day | ORAL | Status: DC

## 2015-03-02 MED ORDER — ESTROGENS, CONJUGATED 0.625 MG/GM VA CREA: TOPICAL_CREAM | VAGINAL | Status: DC

## 2015-03-02 MED ORDER — ESCITALOPRAM OXALATE 20 MG PO TABS: 20.0000 mg | ORAL_TABLET | Freq: Every day | ORAL | Status: DC

## 2015-03-02 NOTE — Progress Notes (Signed)
Pre-visit discussion using our clinic review tool. No additional management support is needed unless otherwise documented below in the visit note.  

## 2015-03-02 NOTE — Progress Notes (Signed)
Patient ID: Maria Keller, female   DOB: April 13, 1950, 65 y.o.   MRN: [id number]   Subjective:    Patient ID: Maria Keller, female    DOB: April 12, 1950, 65 y.o.   MRN: [id number]  HPI  Patient with past history of follicular lymphoma, anemia, chronic back pain and hypercholesterolemia.  She comes in today to follow up on these issues as well as for a complete physical exam.  Tries to exercise some.  Walking on her treadmill some.  No chest pain or tightness.  No sob. Taking PPI twice a day.  If she watches what she eats and takes the medication, symptoms are under reasonable control.  Bowels stable.  Saw GI yesterday.  Stool cards turned in today.  Due to see Dr Leretha Pol today.  Still some sweats, but better.  Plans to discuss with Dr Leretha Pol today.  Overall feels things are stable.     Past Medical History  Diagnosis Date  . Follicular lymphoma ([place])     Followed by Dr Leretha Pol, s/p chemo and XRT  . Anemia     iron deficiency and B12 deficiency  . Hypercholesterolemia   . Shingles outbreak 11/26/2011  . IBS (irritable bowel syndrome)   . Chronic back pain     followed by Dr Primus Bravo  . Nephrolithiasis     followed by Dr Bernardo Heater, s/p stents  . Esophageal stricture     requiring dilatation x 2  . Hiatal hernia   . Depression   . Fibromyalgia   . GERD (gastroesophageal reflux disease)   . IBS (irritable bowel syndrome)   . Follicular lymphoma ([place])   . Fibromyalgia    Past Surgical History  Procedure Laterality Date  . Tonsilectomy/adenoidectomy with myringotomy  1970  . Appendectomy    . Tubal ligation  1976  . Vaginal hysterectomy  1980's    secondary to bleeding  . Left oophorectomy    . Ovarian cyst removal      right  . Bladder surgery  1993  . Cholecystectomy  1995  . Orif ankle fracture  2012  . Rectocele repair N/A   . Breast biopsy N/A   . Leg surgery Left   . Tonsillectomy N/A    Family History  Problem Relation Age of Onset  . Breast cancer      great aunt and  grandfather  . Lung cancer      uncle  . Hypertension Maternal Grandmother   . Heart disease Maternal Grandmother     MI 40s  . Diabetes Maternal Grandmother   . Arthritis Maternal Grandmother   . Diabetes Maternal Grandfather   . Ulcers Mother   . Ulcers Father    Social History   Social History  . Marital Status: Married    Spouse Name: N/A  . Number of Children: N/A  . Years of Education: N/A   Social History Main Topics  . Smoking status: Never Smoker   . Smokeless tobacco: Never Used  . Alcohol Use: No  . Drug Use: No  . Sexual Activity: Not Asked   Other Topics Concern  . None   Social History Narrative    Outpatient Encounter Prescriptions as of 03/02/2015  Medication Sig  . aspirin EC 81 MG tablet Take by mouth.  Marland Kitchen azelastine (ASTELIN) 0.1 % nasal spray Place 1 spray into both nostrils 2 (two) times daily. Use in each nostril as directed  . calcium carbonate (TUMS - DOSED IN MG ELEMENTAL CALCIUM) 500 MG  chewable tablet Chew 1 tablet by mouth as needed.  . cetirizine (ZYRTEC) 10 MG tablet Take by mouth.  . conjugated estrogens (PREMARIN) vaginal cream As directed.  . Diphenhyd-Hydrocort-Nystatin (FIRST-DUKES MOUTHWASH) SUSP Swish and spit 5 mLs every 6 (six) hours as needed for Pain.  Marland Kitchen escitalopram (LEXAPRO) 20 MG tablet Take 1 tablet (20 mg total) by mouth daily.  . Fe Fum-FePoly-Vit C-Vit B3 (INTEGRA) 62.5-62.5-40-3 MG CAPS Take 1 capsule by mouth daily.  . fluticasone (FLONASE) 50 MCG/ACT nasal spray Place 2 sprays into both nostrils daily.  . furosemide (LASIX) 20 MG tablet Take 1 tablet (20 mg total) by mouth daily as needed.  . Omega-3 Fatty Acids (FISH OIL) 1360 MG CAPS Take 1 capsule by mouth daily.  Marland Kitchen omeprazole (PRILOSEC) 20 MG capsule Take 1 capsule (20 mg total) by mouth 2 (two) times daily.  Marland Kitchen senna-docusate (SENOKOT-S) 8.6-50 MG per tablet Take by mouth.  . tizanidine (ZANAFLEX) 2 MG capsule Limit 1 tablet by mouth per day or 2-3 times per day if  tolerated  . valACYclovir (VALTREX) 500 MG tablet Take 1 tablet (500 mg total) by mouth daily.  . [DISCONTINUED] conjugated estrogens (PREMARIN) vaginal cream Place vaginally.  . [DISCONTINUED] escitalopram (LEXAPRO) 20 MG tablet Take by mouth.  . [DISCONTINUED] Fe Fum-FePoly-Vit C-Vit B3 (INTEGRA) 62.5-62.5-40-3 MG CAPS Take 1 capsule by mouth daily.  . [DISCONTINUED] fentaNYL (DURAGESIC) 100 MCG/HR Apply 1 patch to skin every 2 days with fentanyl patch 25 g per hour if tolerated  . [DISCONTINUED] fentaNYL (DURAGESIC) 25 MCG/HR patch Apply 1 patch to skin every 2 days with fentanyl patch 100 g per hour if tolerated  . [DISCONTINUED] omeprazole (PRILOSEC) 20 MG capsule Take 1 capsule (20 mg total) by mouth 2 (two) times daily.  . [DISCONTINUED] oxyCODONE (ROXICODONE) 5 MG immediate release tablet Limit 3-5 times per day if tolerated  . [DISCONTINUED] valACYclovir (VALTREX) 500 MG tablet Take 1 tablet (500 mg total) by mouth daily.  . mupirocin ointment (BACTROBAN) 2 % Apply to affected areas bid   No facility-administered encounter medications on file as of 03/02/2015.    Review of Systems  Constitutional: Negative for appetite change and unexpected weight change.  HENT: Negative for congestion and sinus pressure.   Eyes: Negative for pain and visual disturbance.  Respiratory: Negative for cough, chest tightness and shortness of breath.   Cardiovascular: Negative for chest pain, palpitations and leg swelling.  Gastrointestinal: Negative for nausea, vomiting, abdominal pain and diarrhea.  Genitourinary: Negative for dysuria and difficulty urinating.  Musculoskeletal: Negative for myalgias and joint swelling.  Skin: Negative for color change and rash.  Neurological: Negative for dizziness, light-headedness and headaches.  Hematological: Negative for adenopathy. Does not bruise/bleed easily.  Psychiatric/Behavioral: Negative for dysphoric mood and agitation.       Objective:      Physical Exam  Constitutional: She is oriented to person, place, and time. She appears well-developed and well-nourished. No distress.  HENT:  Nose: Nose normal.  Mouth/Throat: Oropharynx is clear and moist.  Eyes: Right eye exhibits no discharge. Left eye exhibits no discharge. No scleral icterus.  Neck: Neck supple. No thyromegaly present.  Cardiovascular: Normal rate and regular rhythm.   Pulmonary/Chest: Breath sounds normal. No accessory muscle usage. No tachypnea. No respiratory distress. She has no decreased breath sounds. She has no wheezes. She has no rhonchi. Right breast exhibits no inverted nipple, no mass, no nipple discharge and no tenderness (no axillary adenopathy). Left breast exhibits no inverted nipple, no  mass, no nipple discharge and no tenderness (no axilarry adenopathy).  Abdominal: Soft. Bowel sounds are normal. There is no tenderness.  Musculoskeletal: She exhibits no edema or tenderness.  Lymphadenopathy:    She has no cervical adenopathy.  Neurological: She is alert and oriented to person, place, and time.  Skin: Skin is warm. No rash noted. No erythema.  Small pin point lesion - upper abdomen, mid abdomen and left upper arm.    Psychiatric: She has a normal mood and affect. Her behavior is normal.    BP 110/70 mmHg  Pulse 83  Temp(Src) 98.3 F (36.8 C) (Oral)  Resp 18  Ht 5\' 7"  (1.702 m)  Wt 188 lb 4 oz (85.39 kg)  BMI 29.48 kg/m2  SpO2 96% Wt Readings from Last 3 Encounters:  03/24/15 185 lb 6 oz (84.086 kg)  03/08/15 175 lb (79.379 kg)  03/02/15 188 lb 4 oz (85.39 kg)     Lab Results  Component Value Date   WBC 9.5 03/02/2015   HGB 11.4* 03/02/2015   HCT 36 03/02/2015   PLT 185 03/02/2015   GLUCOSE 92 10/27/2014   CHOL 208* 10/27/2014   TRIG 95.0 10/27/2014   HDL 55.10 10/27/2014   LDLDIRECT 164.2 02/11/2013   LDLCALC 134* 10/27/2014   ALT 41* 03/02/2015   AST 49* 03/02/2015   NA 139 03/02/2015   K 3.4 03/02/2015   CL 103 10/27/2014    CREATININE 10.0* 03/02/2015   BUN 31* 03/02/2015   CO2 36* 10/27/2014   TSH 2.01 10/27/2014   HGBA1C 5.7 10/27/2014       Assessment & Plan:   Problem List Items Addressed This Visit    Fibromyalgia    Stable.  Has chronic pain.  Followed at pain clinic.        GERD (gastroesophageal reflux disease)    On omeprazole.  If she watches what she eats and takes her medication, symptoms controlled.  Follow.        Relevant Medications   omeprazole (PRILOSEC) 20 MG capsule   Health care maintenance    Physical today 03/02/15.  S/p hysterectomy.  Mammogram 11/11/13 - ok.  Colonoscopy 03/10/13 - melanosis and internal hemorrhoids.        Hypercholesterolemia    Low cholesterol diet and exercise.  Follow lipid panel.        Low back pain radiating to both legs    Followed by pain clinic.        Lymphoma ([place])    Due to see Dr Leretha Pol today.  Follow.        Night sweats    Still some issues as outlined.  Does not appear to be as bad.  Plans to discuss with Dr Leretha Pol today.  Follow.         Other Visit Diagnoses    Need for prophylactic vaccination against Streptococcus pneumoniae (pneumococcus)    -  Primary    Relevant Orders    Pneumococcal conjugate vaccine 13-valent (Completed)        Einar Pheasant, MD

## 2015-03-03 ENCOUNTER — Telehealth: Payer: Self-pay

## 2015-03-03 NOTE — Telephone Encounter (Signed)
See message in chart. °

## 2015-03-03 NOTE — Telephone Encounter (Signed)
FYI: Pt called stating she had a low grade fever of 100.0 , body aches and extremely painful arm where injection was given, it started last night. Pt states that she is taking Tylenol and warm compresses to her arm. Wanted the office to know she is having a reaction. Please advise

## 2015-03-04 DIAGNOSIS — D509 Iron deficiency anemia, unspecified: Secondary | ICD-10-CM | POA: Diagnosis not present

## 2015-03-04 NOTE — Telephone Encounter (Signed)
Pt called back to report that the area is about the size of a tennis ball in size/swelling & about three fingers width in redness. Pt cell number: [phone].

## 2015-03-04 NOTE — Telephone Encounter (Signed)
Pt stated that she is taking Benedryl 10mg  BID and using warm compresses, declined offer to come in but stated that if she does not improve over the weekend, she will call the office on Monday.

## 2015-03-04 NOTE — Telephone Encounter (Signed)
This message was not routed to anyone. I called to check on patient this morning & arm is still a little red & sore (used warm compresses). No fever or body aches today. Denies trouble with breathing or swallowing & no rash involved.

## 2015-03-04 NOTE — Telephone Encounter (Signed)
It sounds like she had a local reaction to the injection.  Would continue the compresses.  Has she taken an antihistamine?  If feel is not improving or concern, can be evaluated today if needed.

## 2015-03-07 ENCOUNTER — Encounter: Payer: Self-pay | Admitting: Internal Medicine

## 2015-03-08 ENCOUNTER — Ambulatory Visit: Payer: Medicare Other | Attending: Pain Medicine | Admitting: Pain Medicine

## 2015-03-08 ENCOUNTER — Encounter: Payer: Self-pay | Admitting: Pain Medicine

## 2015-03-08 VITALS — HR 68 | Temp 98.5°F | Resp 18 | Ht 67.0 in | Wt 175.0 lb

## 2015-03-08 DIAGNOSIS — M47896 Other spondylosis, lumbar region: Secondary | ICD-10-CM | POA: Diagnosis not present

## 2015-03-08 DIAGNOSIS — M5416 Radiculopathy, lumbar region: Secondary | ICD-10-CM

## 2015-03-08 DIAGNOSIS — B0229 Other postherpetic nervous system involvement: Secondary | ICD-10-CM

## 2015-03-08 DIAGNOSIS — M7060 Trochanteric bursitis, unspecified hip: Secondary | ICD-10-CM | POA: Diagnosis not present

## 2015-03-08 DIAGNOSIS — M5136 Other intervertebral disc degeneration, lumbar region: Secondary | ICD-10-CM

## 2015-03-08 DIAGNOSIS — M47816 Spondylosis without myelopathy or radiculopathy, lumbar region: Secondary | ICD-10-CM

## 2015-03-08 DIAGNOSIS — M533 Sacrococcygeal disorders, not elsewhere classified: Secondary | ICD-10-CM

## 2015-03-08 DIAGNOSIS — M546 Pain in thoracic spine: Secondary | ICD-10-CM | POA: Diagnosis present

## 2015-03-08 DIAGNOSIS — M5116 Intervertebral disc disorders with radiculopathy, lumbar region: Secondary | ICD-10-CM | POA: Diagnosis not present

## 2015-03-08 DIAGNOSIS — M79606 Pain in leg, unspecified: Secondary | ICD-10-CM | POA: Diagnosis present

## 2015-03-08 DIAGNOSIS — M545 Low back pain, unspecified: Secondary | ICD-10-CM

## 2015-03-08 DIAGNOSIS — M791 Myalgia: Secondary | ICD-10-CM | POA: Diagnosis not present

## 2015-03-08 DIAGNOSIS — G8929 Other chronic pain: Secondary | ICD-10-CM

## 2015-03-08 DIAGNOSIS — M51369 Other intervertebral disc degeneration, lumbar region without mention of lumbar back pain or lower extremity pain: Secondary | ICD-10-CM

## 2015-03-08 DIAGNOSIS — C8590 Non-Hodgkin lymphoma, unspecified, unspecified site: Secondary | ICD-10-CM | POA: Insufficient documentation

## 2015-03-08 DIAGNOSIS — M461 Sacroiliitis, not elsewhere classified: Secondary | ICD-10-CM | POA: Diagnosis not present

## 2015-03-08 DIAGNOSIS — M542 Cervicalgia: Secondary | ICD-10-CM | POA: Diagnosis present

## 2015-03-08 DIAGNOSIS — M47817 Spondylosis without myelopathy or radiculopathy, lumbosacral region: Secondary | ICD-10-CM | POA: Diagnosis not present

## 2015-03-08 MED ORDER — FENTANYL 25 MCG/HR TD PT72: MEDICATED_PATCH | TRANSDERMAL | Status: DC

## 2015-03-08 MED ORDER — OXYCODONE HCL 5 MG PO TABS: ORAL_TABLET | ORAL | Status: DC

## 2015-03-08 MED ORDER — FENTANYL 100 MCG/HR TD PT72: MEDICATED_PATCH | TRANSDERMAL | Status: DC

## 2015-03-08 NOTE — Progress Notes (Signed)
   Subjective:    Patient ID: Maria Keller, female    DOB: Apr 21, 1950, 65 y.o.   MRN: [id number]  HPI The patient is a 65 year old female who returns to pain management for further evaluation and treatment of pain involving the neck entire back upper and lower extremity region and right thoracic region. The patient is known degenerative changes of the spine and is with history of shingles involving the thoracic region on the right. The present time patient appears to be with pain fairly well-controlled with some exacerbation of pain which is felt to be due to the cold damp weather. We discussed patient's condition and will continue fentanyl patches and patient will be provided oxycodone for breakthrough pain while wearing fentanyl patches a total of 125 g per hour. The patient was with understanding and agreed with suggested treatment plan. The patient denied any trauma change in events of daily living the call significant change in symptomatology   Review of Systems     Objective:   Physical Exam  There was tenderness over the region of the cervical facet cervical paraspinal must reason a mild degree with mild tenderness of the splenius capitis and occipitalis musculature regions. Palpation of the acromioclavicular and glenohumeral joint regions reproduces minimal discomfort. There was unremarkable Spurling's maneuver. Palpation over the thoracic facet thoracic paraspinal must reason was with minimal tenderness to palpation with no excessive tends to palpation of the thoracic region especially on the right side noted. Patient with previous shingles involving the right thoracic region with postherpetic neuralgia developing which resolved with interventional treatment and modification of medications in pain management Center. There was tends to palpation over the lumbar paraspinal must reason lumbar facet region with lateral bending rotation extension and palpation of the lumbar facets reproducing mild  to moderate discomfort. There was mild to moderate tenderness of the PSIS and PII S region as well as the gluteal and piriformis musculature regions. Palpation along the greater trochanteric region and iliotibial band region was with moderate discomfort. There was mild increase of pain with pressure applied to the ileum with patient in lateral decubitus position. Straight leg raise was tolerates a 30 without increased pain with dorsiflexion noted. DTRs were difficult to elicit patient difficult relaxing. Abdomen was nontender with no costovertebral maintenance noted.      Assessment & Plan:      Degenerative disc disease lumbar spine L4-L5 level degenerative changes most significant with multilevel degenerative changes noted throughout the lumbar spine  Lumbar radiculopathy  Postherpetic neuralgia thoracic region (right side)  Sacroiliac joint dysfunction  Greater trochanteric bursitis  Lymphoma    PLAN   Continue present medication fentanyl patches  May take oxycodone and Zanaflex as needed  F/U PCP Dr. Einar Pheasant for evaliation of  BP and general medical condition today as planned  F/U surgical evaluation. May consider pending follow-up evaluations We will avoid at this time   F/U neurological evaluation. May consider pending follow-up evaluations We will avoid PNCV/EMG studies and other studies at this time  F/U  [facility] as planned  F/U [facility] appointment as needed  May consider radiofrequency rhizolysis or intraspinal procedures pending response to present treatment and F/U evaluation   Please call pain management for any concerns regarding your condition prior to scheduled return appointment

## 2015-03-08 NOTE — Progress Notes (Signed)
Safety precautions to be maintained throughout the outpatient stay will include: orient to surroundings, keep bed in low position, maintain call bell within reach at all times, provide assistance with transfer out of bed and ambulation.  

## 2015-03-08 NOTE — Patient Instructions (Signed)
PLAN   Continue present medication fentanyl patches  May take oxycodone and Zanaflex as needed  F/U PCP Dr. Einar Pheasant for evaliation of  BP and general medical condition today as planned  F/U surgical evaluation. May consider pending follow-up evaluations We will avoid at this time   F/U neurological evaluation. May consider pending follow-up evaluations We will avoid PNCV/EMG studies and other studies at this time  F/U  [facility] as planned  F/U [facility] appointment as needed  May consider radiofrequency rhizolysis or intraspinal procedures pending response to present treatment and F/U evaluation   Please call pain management for any concerns regarding your condition prior to scheduled return appointment

## 2015-03-17 IMAGING — CR DG CHEST 2V
1 series · 2 of 2 positions shown · non-contrast
Comparison: None

CLINICAL DATA: Initial encounter for right side pain. Recent
surgery.

EXAM:
CHEST  2 VIEW

[Series 1: dxr chest pa (or ap) and lateral · 0.14mm/px · 2 of 2 slices shown]
[im 1/2]
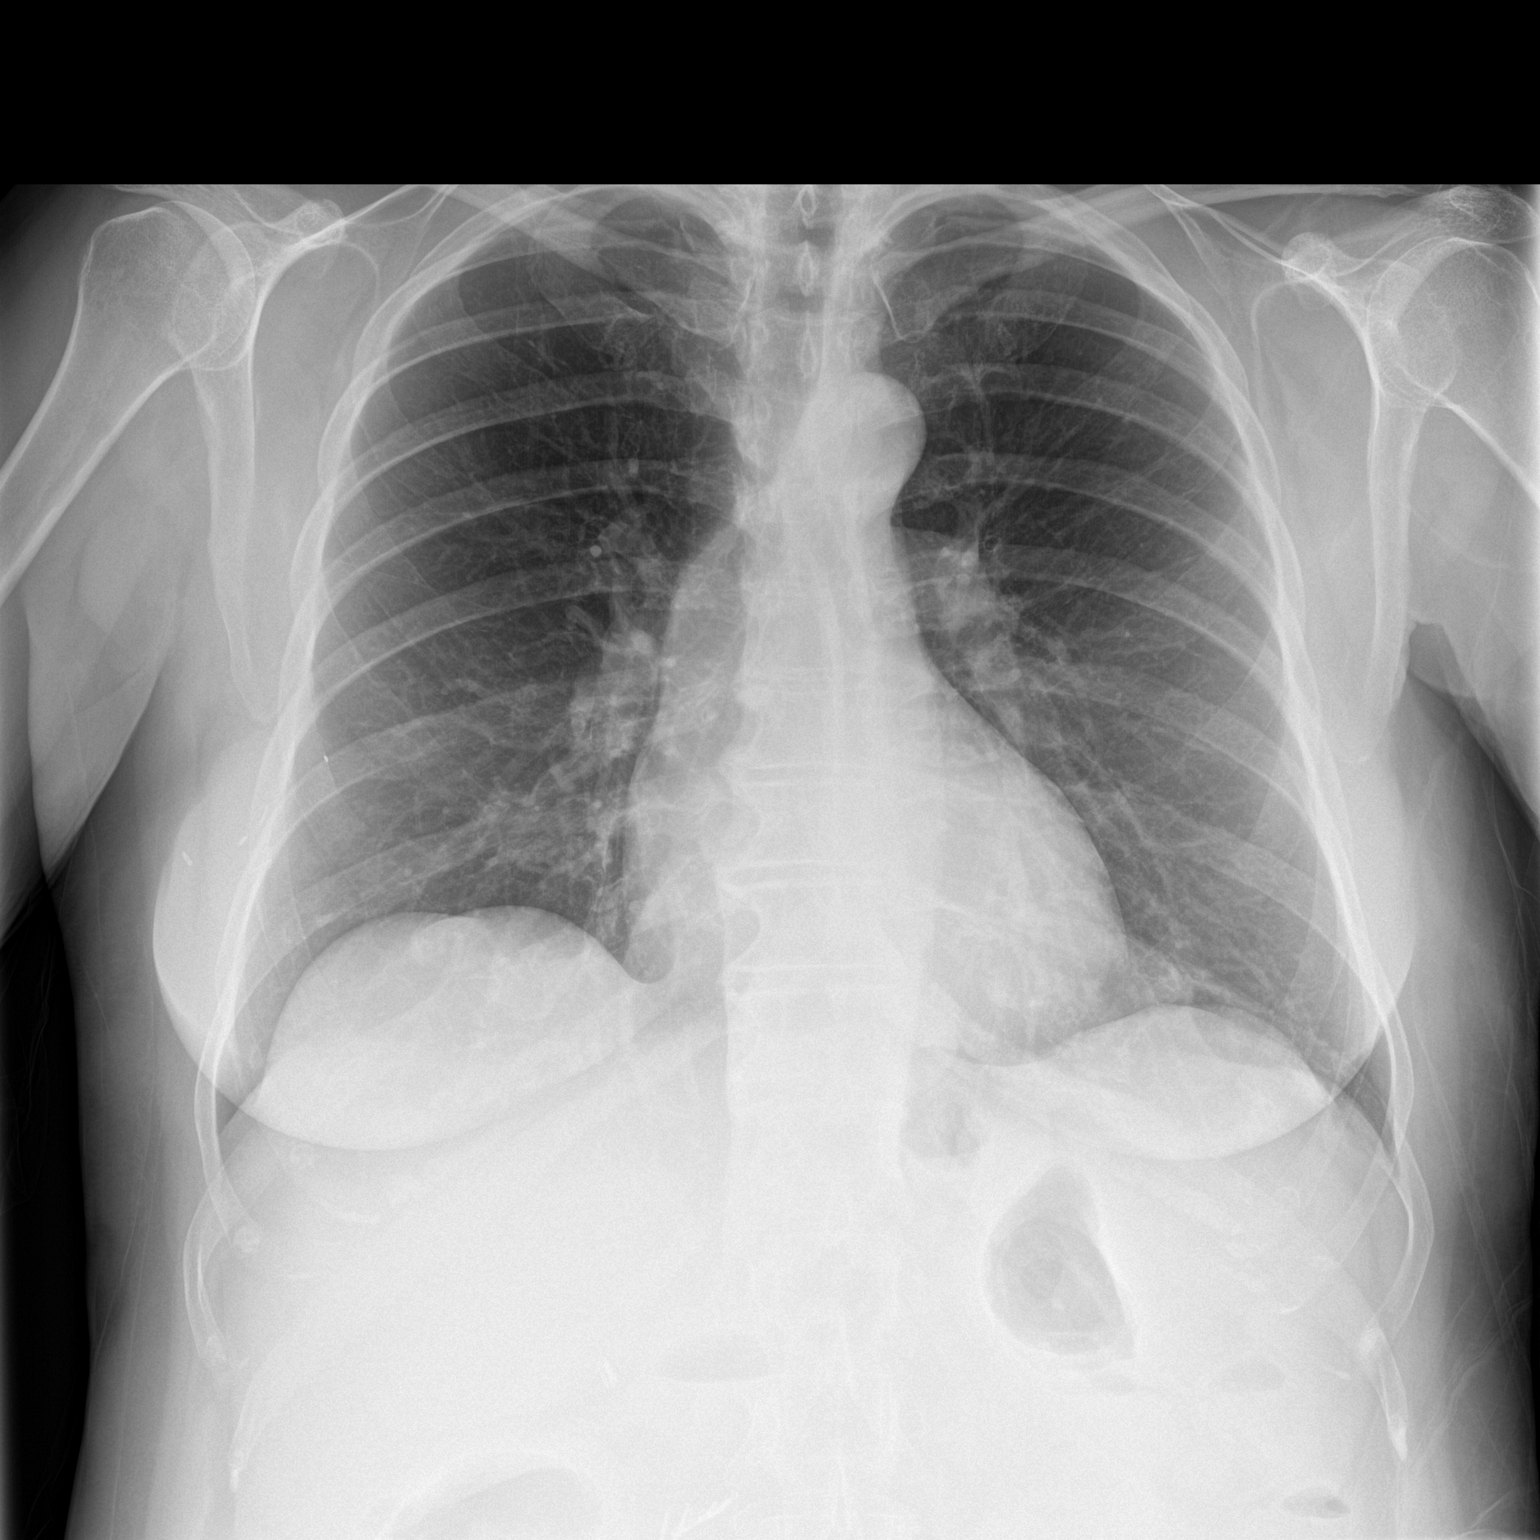
[im 2/2]
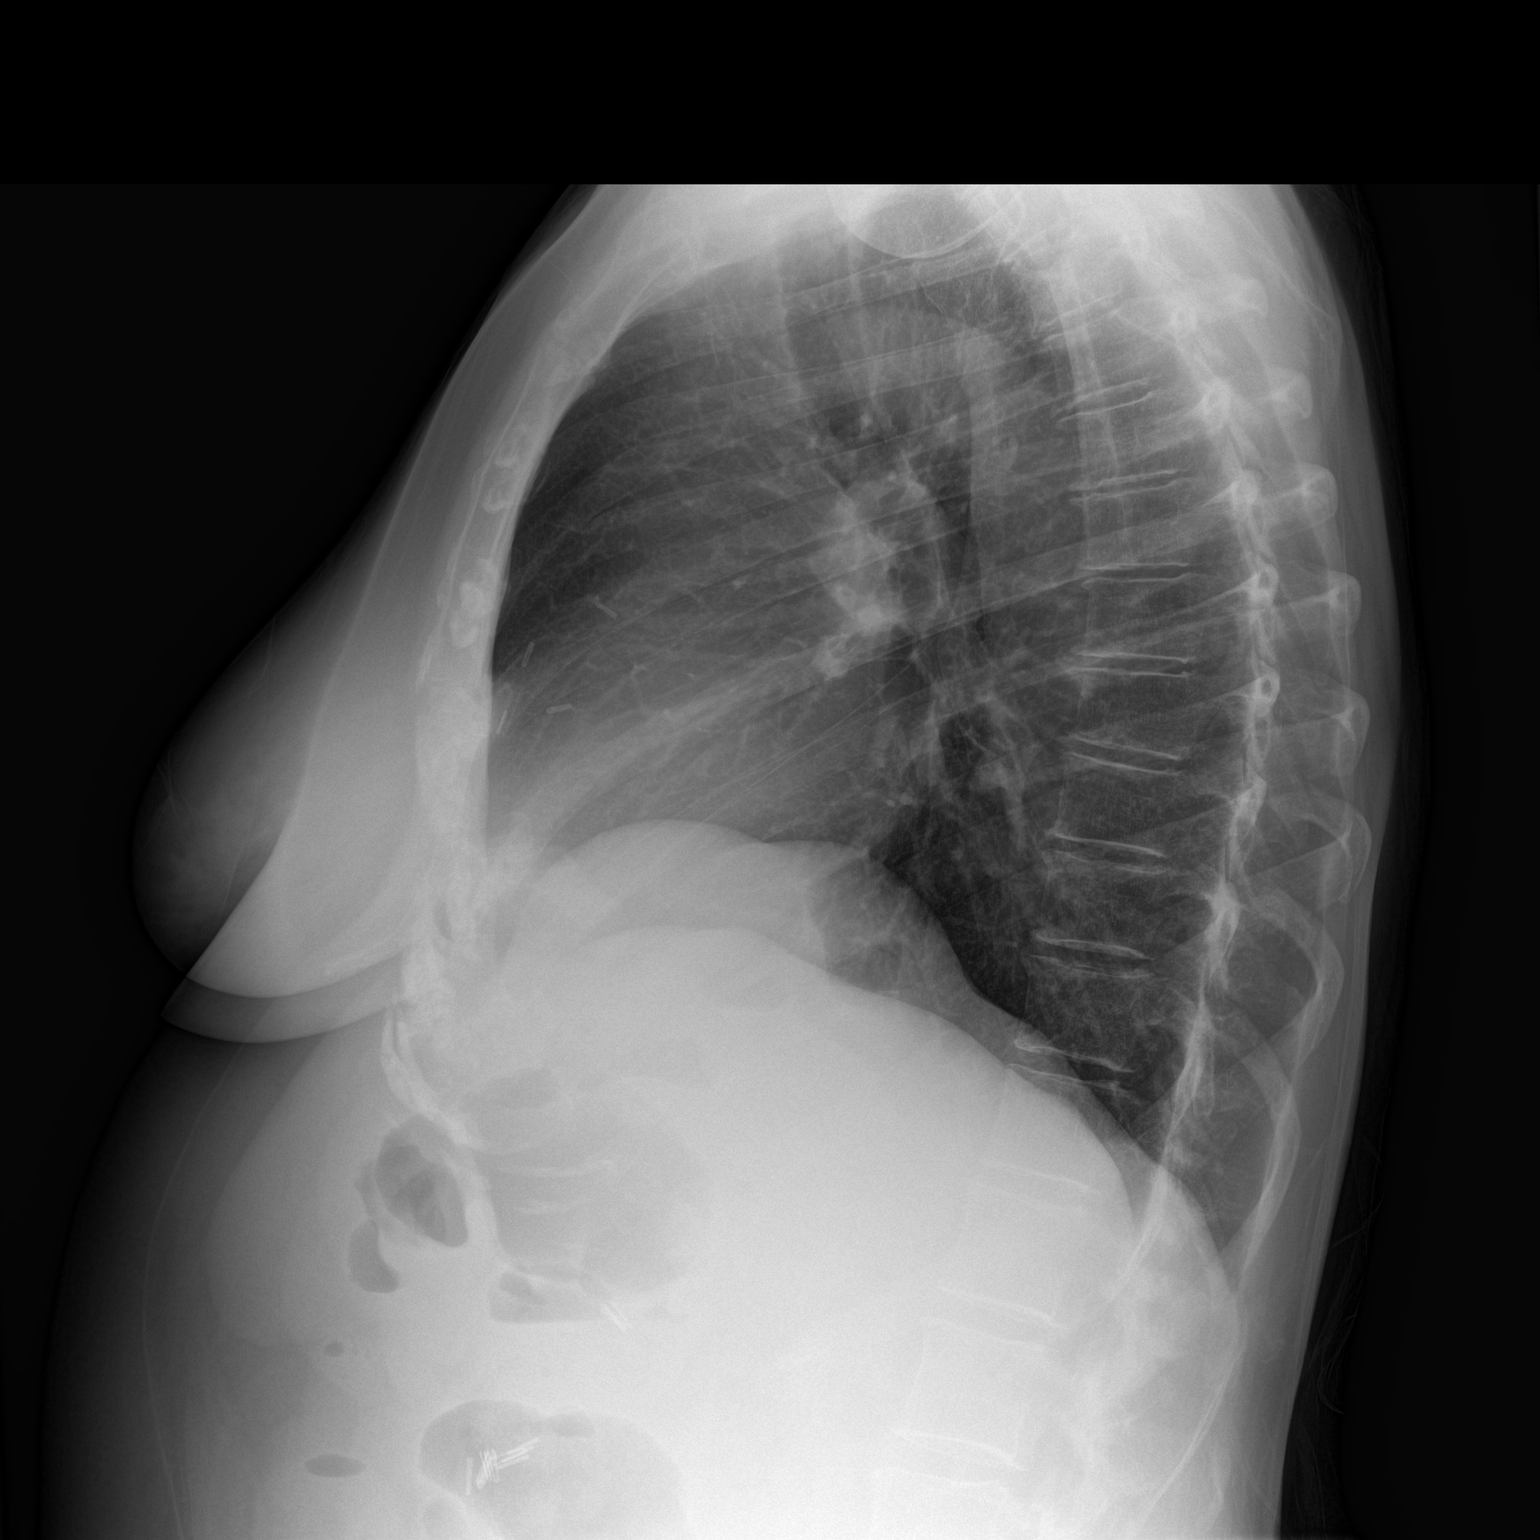

[2 of 2 positions shown; findings below may reference images not displayed]

FINDINGS: The heart size and mediastinal contours are within normal limits.
Both lungs are clear. The visualized skeletal structures are
unremarkable.
IMPRESSION: No active cardiopulmonary disease.

## 2015-03-17 IMAGING — CR DG THORACIC SPINE 2-3V
1 series · 3 of 3 positions shown · non-contrast
Comparison: None.

CLINICAL DATA: Back pain, chest pain

EXAM:
THORACIC SPINE - 2 VIEW

[Series 1: dxr thoracic  ap and lateral · 0.14mm/px · 3 of 3 slices shown]
[im 1/3]
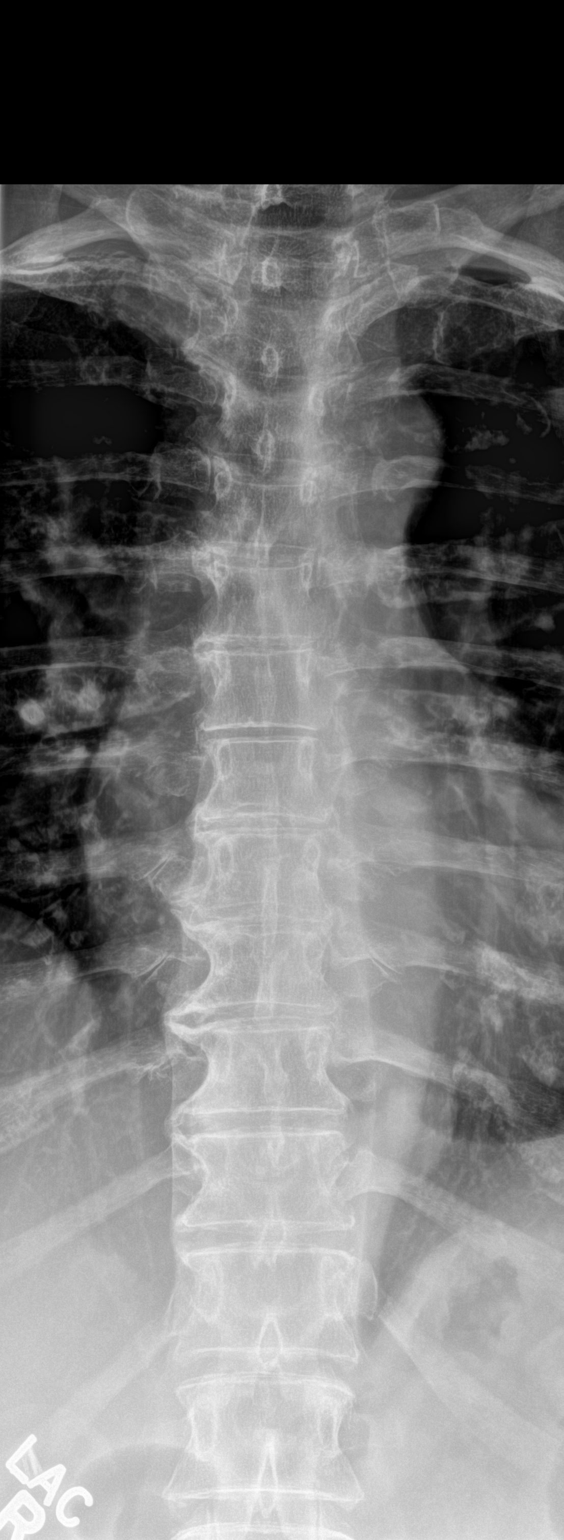
[im 2/3]
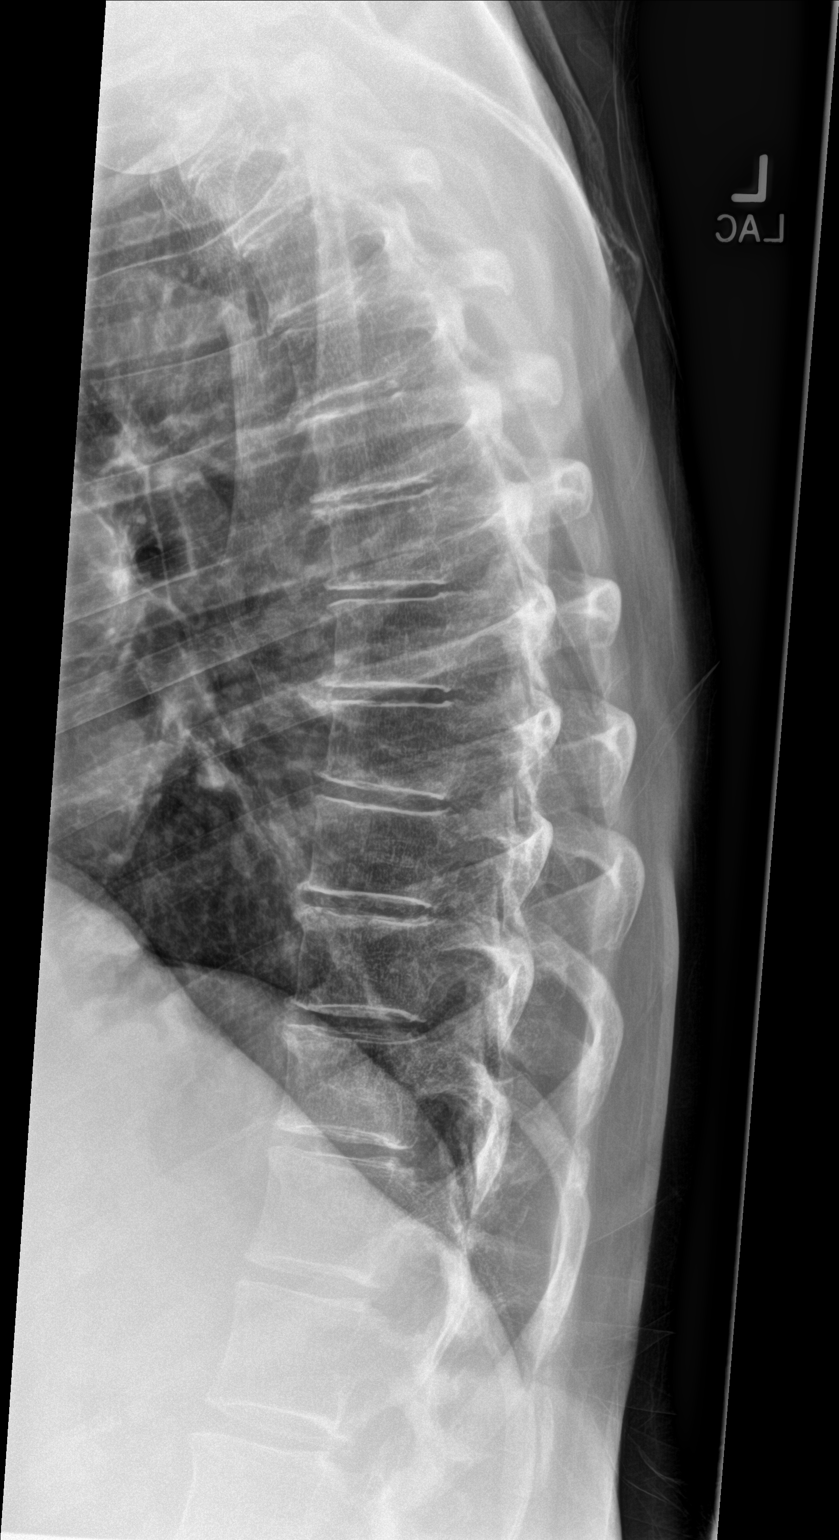
[im 3/3]
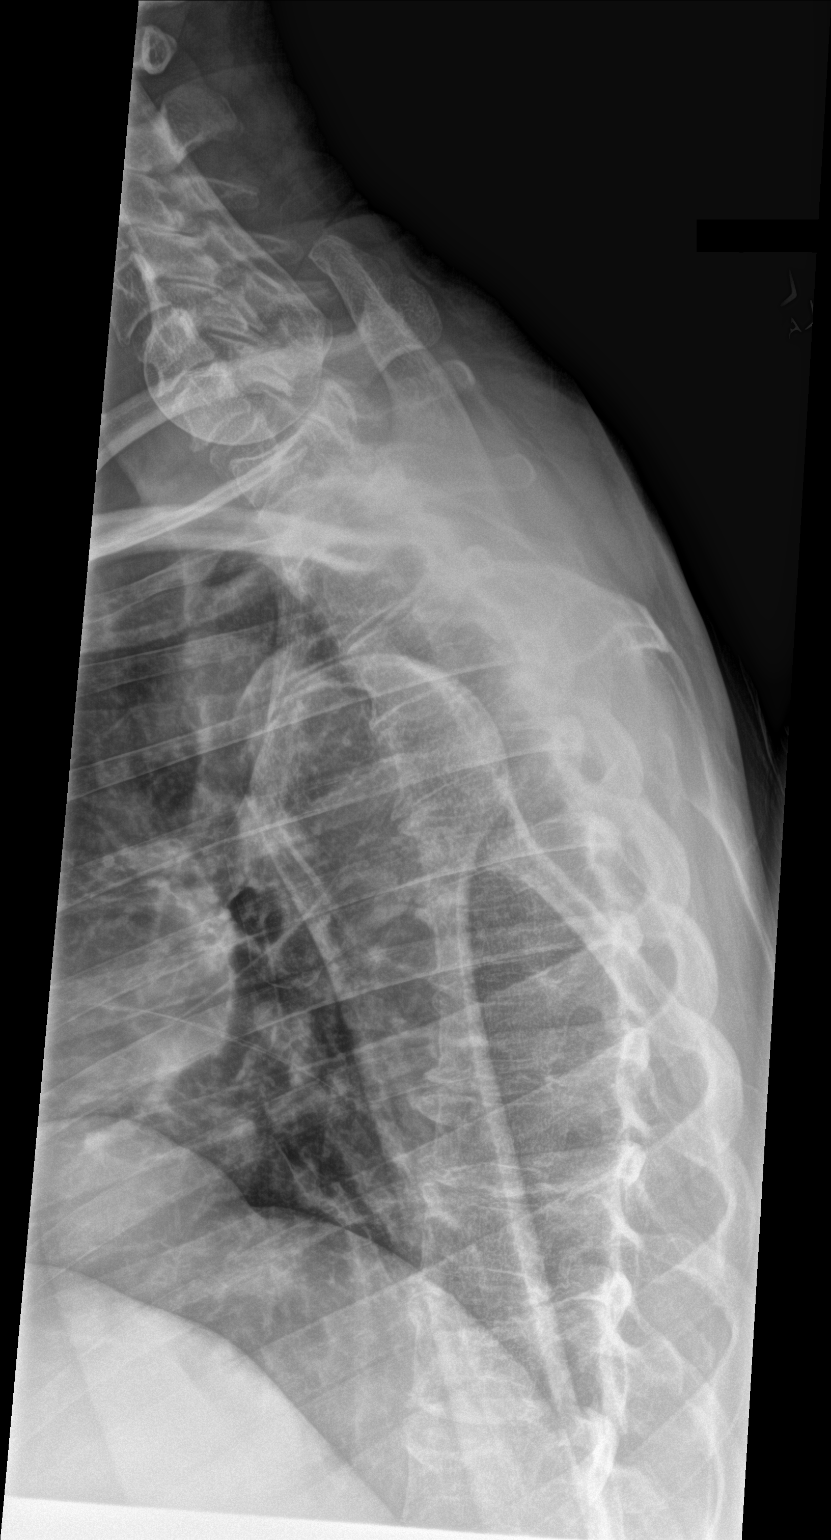

[3 of 3 positions shown; findings below may reference images not displayed]

FINDINGS: There is no evidence of thoracic spine fracture. Alignment is
normal. No other significant bone abnormalities are identified. Mild
thoracic spine spondylosis.
IMPRESSION: No acute osseous injury of the thoracic spine.

## 2015-03-22 ENCOUNTER — Telehealth: Payer: Self-pay | Admitting: Internal Medicine

## 2015-03-22 NOTE — Telephone Encounter (Signed)
Pt called about her insurance only allowing the omeprazole (PRILOSEC) 20 MG capsule 1 time a day and pt has been taking it 2 times a day. Pt insurance number is [id number] Faxx number [phone]. Pharmacy is [facility], [place]. Thank you!

## 2015-03-22 NOTE — Telephone Encounter (Signed)
Called number provided, form being faxed to the office.

## 2015-03-23 ENCOUNTER — Telehealth: Payer: Self-pay

## 2015-03-23 NOTE — Telephone Encounter (Signed)
error 

## 2015-03-23 NOTE — Telephone Encounter (Signed)
Fax completed and faxed back to insurance company

## 2015-03-24 ENCOUNTER — Encounter: Payer: Self-pay | Admitting: Family Medicine

## 2015-03-24 ENCOUNTER — Ambulatory Visit
Admission: RE | Admit: 2015-03-24 | Discharge: 2015-03-24 | Disposition: A | Discharge status: Home / Self Care | Payer: Medicare Other | Source: Ambulatory Visit | Attending: Family Medicine | Admitting: Family Medicine

## 2015-03-24 ENCOUNTER — Ambulatory Visit (INDEPENDENT_AMBULATORY_CARE_PROVIDER_SITE_OTHER): Payer: Medicare Other | Admitting: Family Medicine

## 2015-03-24 VITALS — BP 106/60 | HR 88 | Temp 98.6°F | Ht 67.0 in | Wt 185.4 lb

## 2015-03-24 DIAGNOSIS — J209 Acute bronchitis, unspecified: Secondary | ICD-10-CM

## 2015-03-24 DIAGNOSIS — R509 Fever, unspecified: Secondary | ICD-10-CM | POA: Diagnosis not present

## 2015-03-24 DIAGNOSIS — R05 Cough: Secondary | ICD-10-CM | POA: Diagnosis not present

## 2015-03-24 MED ORDER — PREDNISONE 50 MG PO TABS: ORAL_TABLET | ORAL | Status: DC

## 2015-03-24 MED ORDER — DOXYCYCLINE HYCLATE 100 MG PO TABS: 100.0000 mg | ORAL_TABLET | Freq: Two times a day (BID) | ORAL | Status: DC

## 2015-03-24 MED ORDER — HYDROCOD POLST-CPM POLST ER 10-8 MG/5ML PO SUER: 5.0000 mL | Freq: Two times a day (BID) | ORAL | Status: DC | PRN

## 2015-03-24 NOTE — Patient Instructions (Signed)
Take the medication as prescribed.  We will call with your lab results.  Take care  Dr. Lacinda Axon

## 2015-03-24 NOTE — Progress Notes (Signed)
Pre visit review using our clinic review tool, if applicable. No additional management support is needed unless otherwise documented below in the visit note. 

## 2015-03-24 NOTE — Progress Notes (Signed)
Subjective:  Patient ID: Maria Keller, female    DOB: July 21, 1950  Age: 65 y.o. MRN: [id number]  CC: Fever, chills, body aches, cough, sore throat  HPI:  65 year old female with a collocated past medical history including chronic pain and lymphoma (in remission) presents with the above complaints.  Patient states that she's not felt well for the past 3 weeks. She's been experiencing intermittent fever, chills, body aches, cough, sore throat. She has been taking Mucinex, Tylenol, and Robitussin with no improvement. She reports mild associated shortness of breath. Patient states that she had a fever yesterday of 100.6. She's also had headache. No known exacerbating factors. She has recently seen her oncologist at the end of January and there is no evidence of recurrence.  Social Hx   Social History   Social History  . Marital Status: Married    Spouse Name: N/A  . Number of Children: N/A  . Years of Education: N/A   Social History Main Topics  . Smoking status: Never Smoker   . Smokeless tobacco: Never Used  . Alcohol Use: No  . Drug Use: No  . Sexual Activity: Not Asked   Other Topics Concern  . None   Social History Narrative   Review of Systems  Constitutional: Positive for fever and chills.  HENT: Positive for sore throat.   Respiratory: Positive for cough and shortness of breath.   Musculoskeletal: Positive for myalgias and arthralgias.   Objective:  BP 106/60 mmHg  Pulse 88  Temp(Src) 98.6 F (37 C) (Oral)  Ht 5\' 7"  (1.702 m)  Wt 185 lb 6 oz (84.086 kg)  BMI 29.03 kg/m2  SpO2 95%  BP/Weight 03/24/2015 03/08/2015 99991111  Systolic BP A999333 - A999333  Diastolic BP 60 - 70  Wt. (Lbs) 185.38 175 188.25  BMI 29.03 27.4 29.48   Physical Exam  Constitutional: She appears well-developed. No distress.  HENT:  Head: Normocephalic and atraumatic.  Right Ear: External ear normal.  Left Ear: External ear normal.  Mouth/Throat: Oropharynx is clear and moist.  Normal  TM's bilaterally.   Eyes: Conjunctivae are normal.  Neck: Neck supple.  Cardiovascular: Normal rate and regular rhythm.   Pulmonary/Chest: Effort normal and breath sounds normal. No respiratory distress. She has no wheezes. She has no rales.  Lymphadenopathy:    She has no cervical adenopathy.  Neurological: She is alert.  Vitals reviewed.  Lab Results  Component Value Date   WBC 9.5 03/02/2015   HGB 11.4* 03/02/2015   HCT 36 03/02/2015   PLT 185 03/02/2015   GLUCOSE 92 10/27/2014   CHOL 208* 10/27/2014   TRIG 95.0 10/27/2014   HDL 55.10 10/27/2014   LDLDIRECT 164.2 02/11/2013   LDLCALC 134* 10/27/2014   ALT 41* 03/02/2015   AST 49* 03/02/2015   NA 139 03/02/2015   K 3.4 03/02/2015   CL 103 10/27/2014   CREATININE 10.0* 03/02/2015   BUN 31* 03/02/2015   CO2 36* 10/27/2014   TSH 2.01 10/27/2014   HGBA1C 5.7 10/27/2014   Assessment & Plan:   Problem List Items Addressed This Visit    Acute bronchitis - Primary    New problem. Exam unremarkable. Given comorbidities, obtaining chest x-ray. Treating empirically with prednisone, Tussionex and doxycycline while awaiting chest x-ray results.      Relevant Orders   DG Chest 2 View      Meds ordered this encounter  Medications  . predniSONE (DELTASONE) 50 MG tablet    Sig: 1 tablet daily  x 5 days.    Dispense:  5 tablet    Refill:  0  . chlorpheniramine-HYDROcodone (TUSSIONEX PENNKINETIC ER) 10-8 MG/5ML SUER    Sig: Take 5 mLs by mouth every 12 (twelve) hours as needed.    Dispense:  115 mL    Refill:  0  . doxycycline (VIBRA-TABS) 100 MG tablet    Sig: Take 1 tablet (100 mg total) by mouth 2 (two) times daily.    Dispense:  14 tablet    Refill:  0   Follow-up: PRN  [place]

## 2015-03-24 NOTE — Assessment & Plan Note (Signed)
New problem. Exam unremarkable. Given comorbidities, obtaining chest x-ray. Treating empirically with prednisone, Tussionex and doxycycline while awaiting chest x-ray results.

## 2015-03-25 NOTE — Telephone Encounter (Signed)
Additional PA form faxed

## 2015-03-28 ENCOUNTER — Encounter: Payer: Self-pay | Admitting: Internal Medicine

## 2015-03-28 NOTE — Telephone Encounter (Signed)
PA approved from 03/02/2015-02/05/2016.

## 2015-03-28 NOTE — Assessment & Plan Note (Signed)
Stable.  Has chronic pain.  Followed at pain clinic.

## 2015-03-28 NOTE — Assessment & Plan Note (Signed)
On omeprazole.  If she watches what she eats and takes her medication, symptoms controlled.  Follow.

## 2015-03-28 NOTE — Assessment & Plan Note (Signed)
Followed by pain clinic.  

## 2015-03-28 NOTE — Assessment & Plan Note (Signed)
Physical today 03/02/15.  S/p hysterectomy.  Mammogram 11/11/13 - ok.  Colonoscopy 03/10/13 - melanosis and internal hemorrhoids.

## 2015-03-28 NOTE — Assessment & Plan Note (Signed)
Due to see Dr Leretha Pol today.  Follow.

## 2015-03-28 NOTE — Assessment & Plan Note (Signed)
Still some issues as outlined.  Does not appear to be as bad.  Plans to discuss with Dr Leretha Pol today.  Follow.

## 2015-03-28 NOTE — Assessment & Plan Note (Signed)
Low cholesterol diet and exercise.  Follow lipid panel.   

## 2015-04-05 ENCOUNTER — Ambulatory Visit: Payer: Medicare Other | Admitting: Pain Medicine

## 2015-04-06 DIAGNOSIS — E538 Deficiency of other specified B group vitamins: Secondary | ICD-10-CM | POA: Diagnosis not present

## 2015-04-06 DIAGNOSIS — D509 Iron deficiency anemia, unspecified: Secondary | ICD-10-CM | POA: Diagnosis not present

## 2015-04-07 ENCOUNTER — Encounter: Payer: Self-pay | Admitting: Pain Medicine

## 2015-04-07 ENCOUNTER — Ambulatory Visit: Payer: Medicare Other | Attending: Pain Medicine | Admitting: Pain Medicine

## 2015-04-07 ENCOUNTER — Telehealth: Payer: Self-pay | Admitting: Internal Medicine

## 2015-04-07 VITALS — BP 111/35 | HR 79 | Temp 97.7°F | Resp 16 | Ht 67.0 in | Wt 175.0 lb

## 2015-04-07 DIAGNOSIS — M533 Sacrococcygeal disorders, not elsewhere classified: Secondary | ICD-10-CM | POA: Diagnosis not present

## 2015-04-07 DIAGNOSIS — B0229 Other postherpetic nervous system involvement: Secondary | ICD-10-CM | POA: Diagnosis not present

## 2015-04-07 DIAGNOSIS — M5116 Intervertebral disc disorders with radiculopathy, lumbar region: Secondary | ICD-10-CM | POA: Diagnosis not present

## 2015-04-07 DIAGNOSIS — M5416 Radiculopathy, lumbar region: Secondary | ICD-10-CM | POA: Diagnosis not present

## 2015-04-07 DIAGNOSIS — M545 Low back pain, unspecified: Secondary | ICD-10-CM

## 2015-04-07 DIAGNOSIS — M7060 Trochanteric bursitis, unspecified hip: Secondary | ICD-10-CM | POA: Diagnosis not present

## 2015-04-07 DIAGNOSIS — M542 Cervicalgia: Secondary | ICD-10-CM | POA: Diagnosis present

## 2015-04-07 DIAGNOSIS — M791 Myalgia: Secondary | ICD-10-CM | POA: Diagnosis not present

## 2015-04-07 DIAGNOSIS — G8929 Other chronic pain: Secondary | ICD-10-CM

## 2015-04-07 DIAGNOSIS — C8590 Non-Hodgkin lymphoma, unspecified, unspecified site: Secondary | ICD-10-CM | POA: Insufficient documentation

## 2015-04-07 DIAGNOSIS — M47896 Other spondylosis, lumbar region: Secondary | ICD-10-CM | POA: Insufficient documentation

## 2015-04-07 DIAGNOSIS — M47816 Spondylosis without myelopathy or radiculopathy, lumbar region: Secondary | ICD-10-CM

## 2015-04-07 DIAGNOSIS — M5136 Other intervertebral disc degeneration, lumbar region: Secondary | ICD-10-CM

## 2015-04-07 DIAGNOSIS — M461 Sacroiliitis, not elsewhere classified: Secondary | ICD-10-CM | POA: Diagnosis not present

## 2015-04-07 DIAGNOSIS — M47817 Spondylosis without myelopathy or radiculopathy, lumbosacral region: Secondary | ICD-10-CM | POA: Diagnosis not present

## 2015-04-07 MED ORDER — FENTANYL 100 MCG/HR TD PT72: MEDICATED_PATCH | TRANSDERMAL | Status: DC

## 2015-04-07 MED ORDER — OXYCODONE HCL 5 MG PO TABS: ORAL_TABLET | ORAL | Status: DC

## 2015-04-07 MED ORDER — TIZANIDINE HCL 2 MG PO CAPS: ORAL_CAPSULE | ORAL | Status: DC

## 2015-04-07 MED ORDER — FENTANYL 25 MCG/HR TD PT72
MEDICATED_PATCH | TRANSDERMAL | Status: DC
Start: 2015-04-07 — End: 2015-05-10

## 2015-04-07 NOTE — Progress Notes (Signed)
Subjective:    Patient ID: Maria Keller, female    DOB: 1951-01-05, 65 y.o.   MRN: [id number]  HPI  The patient is a 65 year old female who returns to pain management for further evaluation and treatment of pain involving the neck upper extremity regions mid lower back and lower extremity region. Patient is with known degenerative changes of the spine and is with history of shingles involving the thoracic region. At the present time patient states the pain is well controlled. We have discussed interventional treatment and we will avoid interventional treatment at this time. The patient is able to perform most activities of daily living without any significant pain interfering with activities of daily living. The patient continues to use of Zanaflex oxycodone and fentanyl patches. We will remain available to consider patient for treatment including interventional treatment should they be return of any significant pain. The patient denied any trauma change in events of daily living call significant change in symptomatology. The patient also is able to obtain restful sleep without significant pain interfering with sleep the patient denies any significant pain in the region of the shingles and denies any significant lower back lower extremity pain. The patient does have greater trochanteric bursitis which appears to be fairly well-controlled area we will continue the present treatment regimen and patient is to call pain management should they be change in condition prior to scheduled return appointment. All agreed with suggested treatment plan       Review of Systems     Objective:   Physical Exam  There was tenderness to palpation of paraspinal musculature region cervical region cervical facet region a mild degree with mild tenderness over the splenius capitis and occipitalis musculature regions. Palpation of the acromioclavicular and glenohumeral joint regions reproduces minimal discomfort as  well. Palpation over the thoracic facet thoracic paraspinal musculature region was with minimal discomfort. There was no excessive tends to palpation in the region of the previous shingles of the right thoracic region. There was no crepitus of the thoracic region noted and no increased pain with palpation over the spinous processes of the cervical thoracic or lumbar regions. Palpation over the lumbar paraspinal must reason lumbar facet region was with minimal discomfort with lateral bending rotation extension and palpation of the lumbar facets reproducing minimal discomfort. There was mild to moderate tenderness of the PSIS and PII S region. Palpation of the greater trochanteric region iliotibial band region reproduced moderate discomfort as well. Straight leg raise was tolerates approximately 30 without increase of pain with dorsiflexion noted. DTRs were difficult to elicit appeared to be trace at the knees. There was negative clonus negative Homans. 10 with no costovertebral tenderness noted         Assessment & Plan:     Degenerative disc disease lumbar spine L4-L5 level degenerative changes most significant with multilevel degenerative changes noted throughout the lumbar spine  Lumbar radiculopathy  Postherpetic neuralgia thoracic region (right side)  Sacroiliac joint dysfunction  Greater trochanteric bursitis  Lymphoma      PLAN   Continue present medication fentanyl patches, Zanaflex, and oxycodone  F/U PCP Dr. Einar Pheasant for evaliation of  BP and general medical condition  F/U surgical evaluation. May consider pending follow-up evaluations We will avoid at this time   F/U neurological evaluation. May consider pending follow-up evaluations We will avoid PNCV/EMG studies and other studies at this time  F/U  [facility] as planned  F/U [facility] appointment as needed  May consider radiofrequency  rhizolysis or intraspinal procedures pending response to present treatment  and F/U evaluation. We will avoid such treatment at this time   Please call pain management for any concerns regarding your condition prior to scheduled return appointment

## 2015-04-07 NOTE — Telephone Encounter (Signed)
Pt called needing to sch a AWV. Pt would like if possible first week of April. Call pt @ [phone].Thank you!

## 2015-04-07 NOTE — Patient Instructions (Signed)
PLAN   Continue present medication fentanyl patches, Zanaflex, and oxycodone  F/U PCP Dr. Einar Pheasant for evaliation of  BP and general medical condition  F/U surgical evaluation. May consider pending follow-up evaluations We will avoid at this time   F/U neurological evaluation. May consider pending follow-up evaluations We will avoid PNCV/EMG studies and other studies at this time  F/U  [facility] as planned  F/U [facility] appointment as needed  May consider radiofrequency rhizolysis or intraspinal procedures pending response to present treatment and F/U evaluation. We will avoid such treatment at this time   Please call pain management for any concerns regarding your condition prior to scheduled return appointment

## 2015-04-07 NOTE — Progress Notes (Signed)
Safety precautions to be maintained throughout the outpatient stay will include: orient to surroundings, keep bed in low position, maintain call bell within reach at all times, provide assistance with transfer out of bed and ambulation.  

## 2015-04-12 ENCOUNTER — Telehealth: Payer: Self-pay | Admitting: *Deleted

## 2015-04-12 DIAGNOSIS — D509 Iron deficiency anemia, unspecified: Secondary | ICD-10-CM | POA: Diagnosis not present

## 2015-04-12 NOTE — Telephone Encounter (Signed)
Patient received a call from [place] [facility], her B12 is 39, she stated that she uses intergra. She would like to be advised on what she should do next.  Pt contact [phone]

## 2015-04-13 NOTE — Telephone Encounter (Signed)
Notified pt of Dr. Bary Leriche comments, pt verbalized understanding. Pt questioned if she should continue her iron supplements, Dr.Scott confirmed she should continue taking iron.

## 2015-04-13 NOTE — Telephone Encounter (Signed)
Have her take vitamin B12 1057mcg q day.  Will need B12 level checked in 3 months.  Can schedule with next labs.

## 2015-04-13 NOTE — Telephone Encounter (Signed)
Pt states that her she had recent labs done at [facility] and her B12 is 213 which is considered low, she already takes an iron supplement Fe Fum-FePoly-Vit C-Vit B3 (INTEGRA) 62.5-62.5-40-3 MG CAPS. Pt is asking what needs to be done now that her B12 this is low. Please advise, thanks.

## 2015-04-19 ENCOUNTER — Other Ambulatory Visit: Payer: Self-pay | Admitting: Pain Medicine

## 2015-04-20 ENCOUNTER — Telehealth: Payer: Self-pay | Admitting: *Deleted

## 2015-04-20 NOTE — Telephone Encounter (Signed)
Received approval for Omeprazole 20mg  effective until: 02/05/2016.

## 2015-04-27 ENCOUNTER — Ambulatory Visit (INDEPENDENT_AMBULATORY_CARE_PROVIDER_SITE_OTHER): Payer: Medicare Other

## 2015-04-27 VITALS — BP 106/60 | HR 77 | Temp 98.1°F | Resp 14 | Ht 67.0 in | Wt 177.0 lb

## 2015-04-27 DIAGNOSIS — Z1159 Encounter for screening for other viral diseases: Secondary | ICD-10-CM | POA: Diagnosis not present

## 2015-04-27 DIAGNOSIS — E2839 Other primary ovarian failure: Secondary | ICD-10-CM | POA: Diagnosis not present

## 2015-04-27 DIAGNOSIS — Z0000 Encounter for general adult medical examination without abnormal findings: Secondary | ICD-10-CM | POA: Diagnosis not present

## 2015-04-27 NOTE — Progress Notes (Signed)
Subjective:   Maria Keller is a 65 y.o. female who presents for an Initial Medicare Annual Wellness Visit.  Review of Systems    No ROS.  Medicare Wellness Visit.  Cardiac Risk Factors include: advanced age (>88men, >40 women)     Objective:    Today's Vitals   04/27/15 1100 04/27/15 1109  BP: 106/60   Pulse: 77   Temp: 98.1 F (36.7 C)   TempSrc: Oral   Resp: 14   Height: 5\' 7"  (1.702 m)   Weight: 177 lb (80.287 kg)   SpO2: 95%   PainSc:  3    Body mass index is 27.72 kg/(m^2).   Current Medications (verified) Outpatient Encounter Prescriptions as of 04/27/2015  Medication Sig  . aspirin EC 81 MG tablet Take by mouth.  Marland Kitchen azelastine (ASTELIN) 0.1 % nasal spray Place 1 spray into both nostrils 2 (two) times daily. Use in each nostril as directed  . calcium carbonate (TUMS - DOSED IN MG ELEMENTAL CALCIUM) 500 MG chewable tablet Chew 1 tablet by mouth as needed.  . cetirizine (ZYRTEC) 10 MG tablet Take by mouth.  . chlorpheniramine-HYDROcodone (TUSSIONEX PENNKINETIC ER) 10-8 MG/5ML SUER Take 5 mLs by mouth every 12 (twelve) hours as needed.  . conjugated estrogens (PREMARIN) vaginal cream As directed.  . cyanocobalamin 1000 MCG tablet Take 1,000 mcg by mouth daily.  . Diphenhyd-Hydrocort-Nystatin (FIRST-DUKES MOUTHWASH) SUSP Swish and spit 5 mLs every 6 (six) hours as needed for Pain.  Marland Kitchen doxycycline (VIBRA-TABS) 100 MG tablet Take 1 tablet (100 mg total) by mouth 2 (two) times daily.  Marland Kitchen escitalopram (LEXAPRO) 20 MG tablet Take 1 tablet (20 mg total) by mouth daily.  . Fe Fum-FePoly-Vit C-Vit B3 (INTEGRA) 62.5-62.5-40-3 MG CAPS Take 1 capsule by mouth daily.  . fentaNYL (DURAGESIC) 100 MCG/HR Apply 1 patch to skin every 2 days with fentanyl patch 25 g per hour if tolerated  . fentaNYL (DURAGESIC) 25 MCG/HR patch Apply 1 patch to skin every 2 days with fentanyl patch 100 g per hour if tolerated  . fluticasone (FLONASE) 50 MCG/ACT nasal spray Place 2 sprays into both  nostrils daily.  . furosemide (LASIX) 20 MG tablet Take 1 tablet (20 mg total) by mouth daily as needed.  . mupirocin ointment (BACTROBAN) 2 % Apply to affected areas bid  . Omega-3 Fatty Acids (FISH OIL) 1360 MG CAPS Take 1 capsule by mouth daily.  Marland Kitchen omeprazole (PRILOSEC) 20 MG capsule Take 1 capsule (20 mg total) by mouth 2 (two) times daily.  Marland Kitchen senna-docusate (SENOKOT-S) 8.6-50 MG per tablet Take by mouth.  . valACYclovir (VALTREX) 500 MG tablet Take 1 tablet (500 mg total) by mouth daily.  . [DISCONTINUED] predniSONE (DELTASONE) 50 MG tablet 1 tablet daily x 5 days.   No facility-administered encounter medications on file as of 04/27/2015.    Allergies (verified) Penicillins; Chloral hydrate; Ciprofloxacin; Ciprofloxacin hcl; Colace; Methotrexate derivatives; Oxycodone hcl; Oxycontin; Parafon forte dsc; Penicillin g; Tape; Bextra; Oxycodone; and Rofecoxib   History: Past Medical History  Diagnosis Date  . Follicular lymphoma (Maria Keller)     Followed by Dr Leretha Pol, s/p chemo and XRT  . Anemia     iron deficiency and B12 deficiency  . Hypercholesterolemia   . Shingles outbreak 11/26/2011  . IBS (irritable bowel syndrome)   . Chronic back pain     followed by Dr Primus Bravo  . Nephrolithiasis     followed by Dr Bernardo Heater, s/p stents  . Esophageal stricture  requiring dilatation x 2  . Hiatal hernia   . Depression   . Fibromyalgia   . GERD (gastroesophageal reflux disease)   . IBS (irritable bowel syndrome)   . Follicular lymphoma (Maria Keller)   . Fibromyalgia    Past Surgical History  Procedure Laterality Date  . Tonsilectomy/adenoidectomy with myringotomy  1970  . Appendectomy    . Tubal ligation  1976  . Vaginal hysterectomy  1980's    secondary to bleeding  . Left oophorectomy    . Ovarian cyst removal      right  . Bladder surgery  1993  . Cholecystectomy  1995  . Orif ankle fracture  2012  . Rectocele repair N/A   . Breast biopsy N/A   . Leg surgery Left   . Tonsillectomy  N/A    Family History  Problem Relation Age of Onset  . Breast cancer      great aunt and grandfather  . Lung cancer      uncle  . Hypertension Maternal Grandmother   . Heart disease Maternal Grandmother     MI 99s  . Diabetes Maternal Grandmother   . Arthritis Maternal Grandmother   . Diabetes Maternal Grandfather   . Ulcers Mother   . Ulcers Father    Social History   Occupational History  . Not on file.   Social History Main Topics  . Smoking status: Never Smoker   . Smokeless tobacco: Never Used  . Alcohol Use: No  . Drug Use: No  . Sexual Activity: Yes    Tobacco Counseling Counseling given: Not Answered   Activities of Daily Living In your present state of health, do you have any difficulty performing the following activities: 04/27/2015  Hearing? N  Vision? N  Difficulty concentrating or making decisions? N  Walking or climbing stairs? N  Dressing or bathing? N  Doing errands, shopping? N  Preparing Food and eating ? N  Using the Toilet? N  In the past six months, have you accidently leaked urine? N  Do you have problems with loss of bowel control? Y  Managing your Medications? N  Managing your Finances? N  Housekeeping or managing your Housekeeping? N    Immunizations and Health Maintenance Immunization History  Administered Date(s) Administered  . Influenza Split 02/25/2012  . Influenza,inj,Quad PF,36+ Mos 02/22/2014, 10/27/2014  . Pneumococcal Conjugate-13 03/02/2015  . Zoster 11/06/2005   Health Maintenance Due  Topic Date Due  . Hepatitis C Screening  [birth year]  . TETANUS/TDAP  11/29/1967  . DEXA SCAN  11/28/2013    Patient Care Team: Einar Pheasant, MD as PCP - General (Internal Medicine)  Indicate any recent Medical Services you may have received from other than [facility] providers in the past year (date may be approximate).     Assessment:   This is a routine wellness examination for Maria Keller. The goal of the wellness visit is to assist  the patient how to close the gaps in care and create a preventative care plan for the patient.   Osteoporosis risk reviewed.  DEXA Scan scheduled.  Educational material provided.  Medications reviewed; taking without issues or barriers.  Safety issues reviewed; smoke detectors in the home. Firearms locked in a safe area in the home. Wears seatbelts when driving or riding with others. No violence in the home.  No identified risk were noted; The patient was oriented x 3; appropriate in dress and manner and no objective failures at ADL's or IADL's.   TDAP postponed  for follow up with insurance.  Hepatitis C Screening future orders placed, per patient request; upcoming lab appointment. Educational material provided.  Patient Concerns:  None at this time.  Follow up with PCP as needed.  Hearing/Vision screen Hearing Screening Comments: Passes the whisper test Vision Screening Comments: Followed by Dr. Gwynn Burly Annual visits Bilateral cataracts extracted Wears reading glasses  Dietary issues and exercise activities discussed: Current Exercise Habits: The patient does not participate in regular exercise at present  Goals    . Healthy Lifestyle     Stay hydrated!  Drink plenty of water. Low carb foods.  Lean meats, fruits and vegetables.    . Increase physical activity     Use the treadmill 4 times weekly, 20 minute sessions, as tolerated.      Depression Screen PHQ 2/9 Scores 04/27/2015 04/07/2015 03/08/2015 02/09/2015 01/11/2015 10/18/2014 10/14/2014  PHQ - 2 Score 0 0 0 0 0 - 0  Exception Documentation - - - Patient refusal - Patient refusal Patient refusal    Fall Risk Fall Risk  04/27/2015 04/07/2015 03/08/2015 02/09/2015 01/11/2015  Falls in the past year? No No No (No Data) No  Risk for fall due to : - - - - -  Risk for fall due to (comments): - - - - -    Cognitive Function: MMSE - Mini Mental State Exam 04/27/2015  Orientation to time 5  Orientation to Place 5  Registration 3   Attention/ Calculation 5  Recall 3  Language- name 2 objects 2  Language- repeat 1  Language- follow 3 step command 3  Language- read & follow direction 1  Write a sentence 1  Copy design 1  Total score 30    Screening Tests Health Maintenance  Topic Date Due  . Hepatitis C Screening  12/19/1950  . TETANUS/TDAP  11/29/1967  . DEXA SCAN  11/28/2013  . INFLUENZA VACCINE  09/06/2015  . PNA vac Low Risk Adult (2 of 2 - PPSV23) 03/01/2016  . MAMMOGRAM  11/15/2016  . COLONOSCOPY  03/10/2022  . ZOSTAVAX  Addressed      Plan:   End of life planning; Advance aging; Advanced directives discussed. Copy of current HCPOA/Living Will requested.   During the course of the visit, Bilinda was educated and counseled about the following appropriate screening and preventive services:   Vaccines to include Pneumoccal, Influenza, Hepatitis B, Td, Zostavax, HCV  Electrocardiogram  Cardiovascular disease screening  Colorectal cancer screening  Bone density screening  Diabetes screening  Glaucoma screening  Mammography/PAP  Nutrition counseling  Smoking cessation counseling  Patient Instructions (the written plan) were given to the patient.    Varney Biles, LPN   [phone]   Reviewed above information.  Agree with plan.    Dr Nicki Reaper

## 2015-04-27 NOTE — Patient Instructions (Addendum)
  Maria Keller , Thank you for taking time to come for your Medicare Wellness Visit. I appreciate your ongoing commitment to your health goals. Please review the following plan we discussed and let me know if I can assist you in the future.   Follow up with Dr. Nicki Reaper as needed.   This is a list of the screening recommended for you and due dates:  Health Maintenance  Topic Date Due  .  Hepatitis C: One time screening is recommended by Center for Disease Control  (CDC) for  adults born from [birth year] through 1967.   [birth year]  . Tetanus Vaccine  11/29/1967  . Shingles Vaccine  11/28/2008  . DEXA scan (bone density measurement)  11/28/2013  . Flu Shot  09/06/2015  . Pneumonia vaccines (2 of 2 - PPSV23) 03/01/2016  . Mammogram  11/15/2016  . Colon Cancer Screening  03/10/2022    Bone Densitometry Bone densitometry is an imaging test that uses a special X-ray to measure the amount of calcium and other minerals in your bones (bone density). This test is also known as a bone mineral density test or dual-energy X-ray absorptiometry (DXA). The test can measure bone density at your hip and your spine. It is similar to having a regular X-ray. You may have this test to:  Diagnose a condition that causes weak or thin bones (osteoporosis).  Predict your risk of a broken bone (fracture).  Determine how well osteoporosis treatment is working. LET [facility] CARE PROVIDER KNOW ABOUT:  Any allergies you have.  All medicines you are taking, including vitamins, herbs, eye drops, creams, and over-the-counter medicines.  Previous problems you or members of your family have had with the use of anesthetics.  Any blood disorders you have.  Previous surgeries you have had.  Medical conditions you have.  Possibility of pregnancy.  Any other medical test you had within the previous 14 days that used contrast material. RISKS AND COMPLICATIONS Generally, this is a safe procedure. However, problems can occur  and may include the following:  This test exposes you to a very small amount of radiation.  The risks of radiation exposure may be greater to unborn children. BEFORE THE PROCEDURE  Do not take any calcium supplements for 24 hours before having the test. You can otherwise eat and drink what you usually do.  Take off all metal jewelry, eyeglasses, dental appliances, and any other metal objects. PROCEDURE  You may lie on an exam table. There will be an X-ray generator below you and an imaging device above you.  Other devices, such as boxes or braces, may be used to position your body properly for the scan.  You will need to lie still while the machine slowly scans your body.  The images will show up on a computer monitor. AFTER THE PROCEDURE You may need more testing at a later time.   This information is not intended to replace advice given to you by your health care provider. Make sure you discuss any questions you have with your health care provider.   Document Released: 02/14/2004 Document Revised: 02/12/2014 Document Reviewed: 07/02/2013 Elsevier Interactive Patient Education [employer].

## 2015-05-04 ENCOUNTER — Other Ambulatory Visit (INDEPENDENT_AMBULATORY_CARE_PROVIDER_SITE_OTHER): Payer: Medicare Other

## 2015-05-04 ENCOUNTER — Telehealth: Payer: Self-pay | Admitting: *Deleted

## 2015-05-04 ENCOUNTER — Other Ambulatory Visit: Payer: Self-pay | Admitting: *Deleted

## 2015-05-04 DIAGNOSIS — E7800 Pure hypercholesterolemia, unspecified: Secondary | ICD-10-CM

## 2015-05-04 DIAGNOSIS — R945 Abnormal results of liver function studies: Secondary | ICD-10-CM

## 2015-05-04 DIAGNOSIS — R7989 Other specified abnormal findings of blood chemistry: Secondary | ICD-10-CM

## 2015-05-04 DIAGNOSIS — Z1159 Encounter for screening for other viral diseases: Secondary | ICD-10-CM

## 2015-05-04 LAB — HEPATIC FUNCTION PANEL
ALBUMIN: 4 g/dL (ref 3.5–5.2)
ALK PHOS: 78 U/L (ref 39–117)
ALT: 36 U/L — ABNORMAL HIGH (ref 0–35)
AST: 41 U/L — AB (ref 0–37)
BILIRUBIN DIRECT: 0.1 mg/dL (ref 0.0–0.3)
BILIRUBIN TOTAL: 0.4 mg/dL (ref 0.2–1.2)
Total Protein: 6.6 g/dL (ref 6.0–8.3)

## 2015-05-04 LAB — BASIC METABOLIC PANEL
BUN: 10 mg/dL (ref 6–23)
CALCIUM: 9.6 mg/dL (ref 8.4–10.5)
CHLORIDE: 103 meq/L (ref 96–112)
CO2: 35 meq/L — AB (ref 19–32)
CREATININE: 0.75 mg/dL (ref 0.40–1.20)
GFR: 82.07 mL/min (ref >60.00)
GLUCOSE: 101 mg/dL — AB (ref 70–99)
Potassium: 4.6 mEq/L (ref 3.5–5.1)
Sodium: 142 mEq/L (ref 135–145)

## 2015-05-04 LAB — LIPID PANEL
CHOL/HDL RATIO: 5
Cholesterol: 253 mg/dL — ABNORMAL HIGH (ref 0–200)
HDL: 51.3 mg/dL (ref >39.00)
LDL Cholesterol: 168 mg/dL — ABNORMAL HIGH (ref 0–99)
NONHDL: 201.24
Triglycerides: 167 mg/dL — ABNORMAL HIGH (ref 0.0–149.0)
VLDL: 33.4 mg/dL (ref 0.0–40.0)

## 2015-05-04 LAB — HEPATITIS C ANTIBODY: HCV AB: NEGATIVE

## 2015-05-04 NOTE — Telephone Encounter (Signed)
Patient was seen in the office today for lab.She had concerns that she should have had a cholestorol check as well as Hep C. She would like to know if the provider will order other lads along with her Hep C draw.   [phone]

## 2015-05-04 NOTE — Telephone Encounter (Signed)
Pt is not due for Cholesterol check. Is it yearly? Please advise, thanks

## 2015-05-04 NOTE — Telephone Encounter (Signed)
Please add on these labs if possible.

## 2015-05-10 ENCOUNTER — Other Ambulatory Visit: Payer: Self-pay | Admitting: Internal Medicine

## 2015-05-10 ENCOUNTER — Ambulatory Visit: Payer: Medicare Other | Attending: Pain Medicine | Admitting: Pain Medicine

## 2015-05-10 ENCOUNTER — Encounter: Payer: Self-pay | Admitting: Pain Medicine

## 2015-05-10 VITALS — BP 129/73 | HR 71 | Temp 98.0°F | Resp 18 | Ht 67.5 in | Wt 175.0 lb

## 2015-05-10 DIAGNOSIS — M533 Sacrococcygeal disorders, not elsewhere classified: Secondary | ICD-10-CM

## 2015-05-10 DIAGNOSIS — M47817 Spondylosis without myelopathy or radiculopathy, lumbosacral region: Secondary | ICD-10-CM | POA: Diagnosis not present

## 2015-05-10 DIAGNOSIS — M5116 Intervertebral disc disorders with radiculopathy, lumbar region: Secondary | ICD-10-CM | POA: Diagnosis not present

## 2015-05-10 DIAGNOSIS — M5136 Other intervertebral disc degeneration, lumbar region: Secondary | ICD-10-CM

## 2015-05-10 DIAGNOSIS — M542 Cervicalgia: Secondary | ICD-10-CM | POA: Diagnosis present

## 2015-05-10 DIAGNOSIS — C8590 Non-Hodgkin lymphoma, unspecified, unspecified site: Secondary | ICD-10-CM | POA: Insufficient documentation

## 2015-05-10 DIAGNOSIS — M7060 Trochanteric bursitis, unspecified hip: Secondary | ICD-10-CM | POA: Insufficient documentation

## 2015-05-10 DIAGNOSIS — M461 Sacroiliitis, not elsewhere classified: Secondary | ICD-10-CM | POA: Diagnosis not present

## 2015-05-10 DIAGNOSIS — M47816 Spondylosis without myelopathy or radiculopathy, lumbar region: Secondary | ICD-10-CM | POA: Insufficient documentation

## 2015-05-10 DIAGNOSIS — B0229 Other postherpetic nervous system involvement: Secondary | ICD-10-CM | POA: Diagnosis not present

## 2015-05-10 DIAGNOSIS — M546 Pain in thoracic spine: Secondary | ICD-10-CM | POA: Diagnosis present

## 2015-05-10 DIAGNOSIS — R945 Abnormal results of liver function studies: Secondary | ICD-10-CM

## 2015-05-10 DIAGNOSIS — M545 Low back pain: Secondary | ICD-10-CM

## 2015-05-10 DIAGNOSIS — M791 Myalgia: Secondary | ICD-10-CM | POA: Diagnosis not present

## 2015-05-10 DIAGNOSIS — R7989 Other specified abnormal findings of blood chemistry: Secondary | ICD-10-CM

## 2015-05-10 DIAGNOSIS — M79604 Pain in right leg: Secondary | ICD-10-CM

## 2015-05-10 DIAGNOSIS — G8929 Other chronic pain: Secondary | ICD-10-CM

## 2015-05-10 DIAGNOSIS — M549 Dorsalgia, unspecified: Secondary | ICD-10-CM | POA: Diagnosis present

## 2015-05-10 DIAGNOSIS — M5416 Radiculopathy, lumbar region: Secondary | ICD-10-CM | POA: Diagnosis not present

## 2015-05-10 MED ORDER — OXYCODONE HCL 5 MG PO TABS: ORAL_TABLET | ORAL | Status: DC

## 2015-05-10 MED ORDER — FENTANYL 100 MCG/HR TD PT72: MEDICATED_PATCH | TRANSDERMAL | Status: DC

## 2015-05-10 MED ORDER — FENTANYL 25 MCG/HR TD PT72: MEDICATED_PATCH | TRANSDERMAL | Status: DC

## 2015-05-10 NOTE — Patient Instructions (Signed)
PLAN   Continue present medication fentanyl patches, Zanaflex, and oxycodone  F/U PCP Dr. Einar Pheasant for evaliation of  BP and general medical condition  F/U surgical evaluation. May consider pending follow-up evaluations We will avoid at this time   F/U neurological evaluation. May consider pending follow-up evaluations We will avoid PNCV/EMG studies and other studies at this time  F/U  [facility] as planned and as discussed  F/U [facility] appointment as needed  May consider radiofrequency rhizolysis or intraspinal procedures pending response to present treatment and F/U evaluation. We will avoid such treatment at this time   Please call pain management for any concerns regarding your condition prior to scheduled return appointment

## 2015-05-10 NOTE — Progress Notes (Signed)
Order placed for abdominal ultrasound.   

## 2015-05-10 NOTE — Progress Notes (Signed)
Subjective:    Patient ID: Maria Keller, female    DOB: [birth year], 65 y.o.   MRN: [id number]  HPI  The patient is a 65 year old female who returns to pain management for further evaluation and treatment of pain involving the neck entire back upper and lower extremity regions. The patient is with history of lymphoma and continues under the care of Dr.DeCastro block evaluation and treatment of lymphoma. The patient is with pain involving the lower back lower extremity region and thoracic region which appears to be fairly well-controlled this time. The patient is with history of prior shingles with postherpetic neuralgia which is involving the thoracic region on the right which is well-controlled at this time. The patient also is with history of lumbar lower extremity pain is with pain fairly well-controlled involving the lumbar lower extremity region at this time. The patient continues to care for [facility] and Dr. Cindie Keller [facility] as well as her physician at [facility] Dr.DeCastro. We discussed patient's condition and will continue fentanyl patches as well as oxycodone for breakthrough pain. The patient denies trauma change in events of daily living the call significant change in symptoms pathology.   Review of Systems     Objective:   Physical Exam   There was tenderness to palpation of the paraspinal musculature region of the cervical region cervical facet region a mild degree with mild tenderness over the splenius capitis and occipitalis musculature regions. Palpation of the acromioclavicular and glenohumeral joint regions were attends to palpation of mild degree and patient was with unremarkable Spurling's maneuver. Tinel and Phalen's maneuver were without increased pain of significant re-and patient was with bilaterally equal grip strength. Palpation over the thoracic region thoracic facet region was with minimal tenderness to palpation with no increase of pain with  grasping skin between the digits. There was tenderness over the thoracic paraspinal musculature region a mild degree with no crepitus of the thoracic region noted. Palpation over the lumbar paraspinal must reason lumbar facet region was attends to palpation of moderate degree with lateral bending rotation extension and palpation of the lumbar facets reproducing moderate discomfort. There was mild to moderate tenderness over the PSIS and PII S region as well as moderate tenderness to palpation of the greater trochanteric region iliotibial band region.. There was no increase of pain of significant degree with Patrick's maneuver DTRs were difficult to elicit patient difficult relaxing DTRs appeared to be trace at the knees and there was negative clonus negative Homans. Abdomen nontender with no costovertebral tenderness noted.      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 Einar Pheasant for evaliation of  BP and general medical condition  F/U surgical evaluation. May consider pending follow-up evaluations We will avoid at this time   F/U neurological evaluation. May consider pending follow-up evaluations We will avoid PNCV/EMG studies and other studies at this time  F/U  [facility] as planned and as discussed  F/U [facility] appointment as needed  May consider radiofrequency rhizolysis or intraspinal procedures pending response to present treatment and F/U evaluation. We will avoid such treatment at this time  Please call pain management for any concerns regarding your condition prior to scheduled return  appointment

## 2015-05-10 NOTE — Progress Notes (Signed)
Safety precautions to be maintained throughout the outpatient stay will include: orient to surroundings, keep bed in low position, maintain call bell within reach at all times, provide assistance with transfer out of bed and ambulation.  

## 2015-05-12 ENCOUNTER — Encounter: Payer: Self-pay | Admitting: *Deleted

## 2015-05-12 ENCOUNTER — Ambulatory Visit
Admission: RE | Admit: 2015-05-12 | Discharge: 2015-05-12 | Disposition: A | Discharge status: Home / Self Care | Payer: Medicare Other | Source: Ambulatory Visit | Attending: Internal Medicine | Admitting: Internal Medicine

## 2015-05-12 DIAGNOSIS — Z780 Asymptomatic menopausal state: Secondary | ICD-10-CM | POA: Diagnosis not present

## 2015-05-12 DIAGNOSIS — E2839 Other primary ovarian failure: Secondary | ICD-10-CM

## 2015-05-12 DIAGNOSIS — Z13820 Encounter for screening for osteoporosis: Secondary | ICD-10-CM | POA: Insufficient documentation

## 2015-05-13 ENCOUNTER — Encounter: Payer: Self-pay | Admitting: *Deleted

## 2015-05-13 ENCOUNTER — Ambulatory Visit
Admission: RE | Admit: 2015-05-13 | Discharge: 2015-05-13 | Disposition: A | Discharge status: Home / Self Care | Payer: Medicare Other | Source: Ambulatory Visit | Attending: Internal Medicine | Admitting: Internal Medicine

## 2015-05-13 DIAGNOSIS — R945 Abnormal results of liver function studies: Secondary | ICD-10-CM

## 2015-05-13 DIAGNOSIS — K760 Fatty (change of) liver, not elsewhere classified: Secondary | ICD-10-CM | POA: Diagnosis not present

## 2015-05-13 DIAGNOSIS — R7989 Other specified abnormal findings of blood chemistry: Secondary | ICD-10-CM | POA: Insufficient documentation

## 2015-05-19 ENCOUNTER — Telehealth: Payer: Self-pay | Admitting: *Deleted

## 2015-05-19 NOTE — Telephone Encounter (Signed)
Patient has requested her results for her bone density test, and abdominal ultrasound Patient Contact  [phone]

## 2015-05-19 NOTE — Telephone Encounter (Signed)
Informed patient of abdominal ultra sound results. She stated had gotten the bone density results already.

## 2015-06-02 ENCOUNTER — Other Ambulatory Visit: Payer: Self-pay | Admitting: Internal Medicine

## 2015-06-03 NOTE — Telephone Encounter (Signed)
ok'd refill for lexapro #30 with 2 refills.

## 2015-06-14 ENCOUNTER — Encounter: Payer: Self-pay | Admitting: Pain Medicine

## 2015-06-14 ENCOUNTER — Ambulatory Visit: Payer: Medicare Other | Attending: Pain Medicine | Admitting: Pain Medicine

## 2015-06-14 VITALS — BP 113/71 | HR 67 | Temp 98.3°F | Resp 16 | Ht 67.0 in | Wt 175.0 lb

## 2015-06-14 DIAGNOSIS — M542 Cervicalgia: Secondary | ICD-10-CM | POA: Diagnosis present

## 2015-06-14 DIAGNOSIS — M79605 Pain in left leg: Secondary | ICD-10-CM | POA: Diagnosis present

## 2015-06-14 DIAGNOSIS — M79604 Pain in right leg: Secondary | ICD-10-CM

## 2015-06-14 DIAGNOSIS — M47896 Other spondylosis, lumbar region: Secondary | ICD-10-CM | POA: Insufficient documentation

## 2015-06-14 DIAGNOSIS — M533 Sacrococcygeal disorders, not elsewhere classified: Secondary | ICD-10-CM | POA: Diagnosis not present

## 2015-06-14 DIAGNOSIS — M5116 Intervertebral disc disorders with radiculopathy, lumbar region: Secondary | ICD-10-CM | POA: Insufficient documentation

## 2015-06-14 DIAGNOSIS — M47816 Spondylosis without myelopathy or radiculopathy, lumbar region: Secondary | ICD-10-CM

## 2015-06-14 DIAGNOSIS — B0229 Other postherpetic nervous system involvement: Secondary | ICD-10-CM | POA: Insufficient documentation

## 2015-06-14 DIAGNOSIS — M7060 Trochanteric bursitis, unspecified hip: Secondary | ICD-10-CM | POA: Insufficient documentation

## 2015-06-14 DIAGNOSIS — M5416 Radiculopathy, lumbar region: Secondary | ICD-10-CM | POA: Diagnosis not present

## 2015-06-14 DIAGNOSIS — G8929 Other chronic pain: Secondary | ICD-10-CM

## 2015-06-14 DIAGNOSIS — C8590 Non-Hodgkin lymphoma, unspecified, unspecified site: Secondary | ICD-10-CM | POA: Diagnosis not present

## 2015-06-14 DIAGNOSIS — M461 Sacroiliitis, not elsewhere classified: Secondary | ICD-10-CM | POA: Diagnosis not present

## 2015-06-14 DIAGNOSIS — M545 Low back pain: Secondary | ICD-10-CM

## 2015-06-14 DIAGNOSIS — M546 Pain in thoracic spine: Secondary | ICD-10-CM | POA: Diagnosis present

## 2015-06-14 DIAGNOSIS — M791 Myalgia: Secondary | ICD-10-CM | POA: Diagnosis not present

## 2015-06-14 DIAGNOSIS — M5136 Other intervertebral disc degeneration, lumbar region: Secondary | ICD-10-CM

## 2015-06-14 DIAGNOSIS — M47817 Spondylosis without myelopathy or radiculopathy, lumbosacral region: Secondary | ICD-10-CM | POA: Diagnosis not present

## 2015-06-14 MED ORDER — FENTANYL 100 MCG/HR TD PT72: MEDICATED_PATCH | TRANSDERMAL | Status: DC

## 2015-06-14 MED ORDER — FENTANYL 25 MCG/HR TD PT72: MEDICATED_PATCH | TRANSDERMAL | Status: DC

## 2015-06-14 NOTE — Progress Notes (Signed)
Safety precautions to be maintained throughout the outpatient stay will include: orient to surroundings, keep bed in low position, maintain call bell within reach at all times, provide assistance with transfer out of bed and ambulation.  

## 2015-06-14 NOTE — Patient Instructions (Addendum)
PLAN   Continue present medication fentanyl patches, Zanaflex, and oxycodone  F/U PCP Dr. Einar Pheasant for evaliation of  BP and general medical condition  F/U surgical evaluation.  We will avoid at this time   F/U neurological evaluation. May consider pending follow-up evaluations We will avoid PNCV/EMG studies and other studies at this time  F/U  [facility] as planned and as discussed  F/U [facility] appointment as needed  May consider radiofrequency rhizolysis or intraspinal procedures pending response to present treatment and F/U evaluation. We will avoid such treatment at this time   Please call pain management for any concerns regarding your condition prior to scheduled return appointmentPain Management Discharge Instructions  General Discharge Instructions :  If you need to reach your doctor call: Monday-Friday 8:00 am - 4:00 pm at [phone] or toll free [phone].  After clinic hours [phone] to have operator reach doctor.  Bring all of your medication bottles to all your appointments in the pain clinic.  To cancel or reschedule your appointment with Pain Management please remember to call 24 hours in advance to avoid a fee.  Refer to the educational materials which you have been given on: General Risks, I had my Procedure. Discharge Instructions, Post Sedation.  Post Procedure Instructions:  The drugs you were given will stay in your system until tomorrow, so for the next 24 hours you should not drive, make any legal decisions or drink any alcoholic beverages.  You may eat anything you prefer, but it is better to start with liquids then soups and crackers, and gradually work up to solid foods.  Please notify your doctor immediately if you have any unusual bleeding, trouble breathing or pain that is not related to your normal pain.  Depending on the type of procedure that was done, some parts of your body may feel week and/or numb.  This usually clears up by  tonight or the next day.  Walk with the use of an assistive device or accompanied by an adult for the 24 hours.  You may use ice on the affected area for the first 24 hours.  Put ice in a Ziploc bag and cover with a towel and place against area 15 minutes on 15 minutes off.  You may switch to heat after 24 hours.

## 2015-06-14 NOTE — Progress Notes (Signed)
   Subjective:    Patient ID: Maria Keller, female    DOB: 1950-07-10, 65 y.o.   MRN: [id number]  HPI  The patient is a 65 year old female who returns to pain management for further evaluation and treatment of pain involving the neck entire back upper and lower extremity regions. The patient states the pain is well-controlled at the present time patient is without need for oxycodone on Zanaflex. The patient will continue fentanyl patches a total of 125 g per hour and we will consider modification of treatment regimen should there be significant pain despite noninterventional treatment measures. The patient was with understanding and agreed with suggested treatment plan. We will continue presently prescribed medications and will consider patient for modification of treatment regimen should there be significant change in condition prior to scheduled return appointment. All agreed to suggested treatment plan  Review of Systems     Objective:   Physical Exam  There was tenderness to palpation of paraspinal musculature in the cervical region cervical facet region a mild degree with mild tenderness of the splenius capitis and occipitalis musculature regions. The patient appeared to be with bilaterally equal grip strength and Tinel and Phalen's maneuver were without increase of pain of significant degree. There was mild tenderness of the acromioclavicular glenohumeral joint region. The patient was with what appeared to be bilaterally equal grip strength and Tinel and Phalen's maneuver reproducing mild discomfort. There was tenderness over the thoracic facet thoracic paraspinal musculature region with tenderness to palpation of the right thoracic paraspinal musculature region and mid axillary thoracic region on the right reproducing minimal discomfort to no discomfort. The patient is prior history of shingles involving the right thoracic region significant improvement following interventional treatment  performed in pain management Center. There was tenderness over the lumbar paraspinal must reason lumbar facet region a mild degree with lateral bending rotation extension and palpation the lumbar facets reproducing mild discomfort. Was mild to moderate tenderness of the PSIS and PII S region and moderate tenderness of the greater trochanteric region iliotibial band region. No definite sensory deficit or dermatomal dystrophy detected. There was negative clonus negative Homans. DTRs appeared to be trace at the knees. 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 Einar Pheasant for evaliation of  BP and general medical condition  F/U surgical evaluation.  We will avoid at this time   F/U neurological evaluation. May consider pending follow-up evaluations We will avoid PNCV/EMG studies and other studies at this time  F/U  [facility] as planned and as discussed  F/U [facility] appointment as needed  May consider radiofrequency rhizolysis or intraspinal procedures pending response to present treatment and F/U evaluation. We will avoid such treatment at this time   Please call pain management for any concerns regarding your condition prior to scheduled return appointment

## 2015-06-30 ENCOUNTER — Ambulatory Visit (INDEPENDENT_AMBULATORY_CARE_PROVIDER_SITE_OTHER): Payer: Medicare Other | Admitting: Internal Medicine

## 2015-06-30 ENCOUNTER — Encounter: Payer: Self-pay | Admitting: Internal Medicine

## 2015-06-30 VITALS — BP 108/74 | HR 83 | Temp 99.2°F | Ht 67.0 in | Wt 179.8 lb

## 2015-06-30 DIAGNOSIS — C8580 Other specified types of non-Hodgkin lymphoma, unspecified site: Secondary | ICD-10-CM

## 2015-06-30 DIAGNOSIS — R05 Cough: Secondary | ICD-10-CM

## 2015-06-30 DIAGNOSIS — D649 Anemia, unspecified: Secondary | ICD-10-CM

## 2015-06-30 DIAGNOSIS — M79604 Pain in right leg: Secondary | ICD-10-CM

## 2015-06-30 DIAGNOSIS — M545 Low back pain: Secondary | ICD-10-CM

## 2015-06-30 DIAGNOSIS — E663 Overweight: Secondary | ICD-10-CM

## 2015-06-30 DIAGNOSIS — E7800 Pure hypercholesterolemia, unspecified: Secondary | ICD-10-CM

## 2015-06-30 DIAGNOSIS — M79605 Pain in left leg: Secondary | ICD-10-CM

## 2015-06-30 DIAGNOSIS — R059 Cough, unspecified: Secondary | ICD-10-CM

## 2015-06-30 DIAGNOSIS — M797 Fibromyalgia: Secondary | ICD-10-CM | POA: Diagnosis not present

## 2015-06-30 NOTE — Progress Notes (Signed)
Patient ID: Maria Keller, female   DOB: Mar 26, 1950, 65 y.o.   MRN: [id number]   Subjective:    Patient ID: Maria Keller, female    DOB: [birth year], 65 y.o.   MRN: [id number]  HPI  Patient here for a scheduled follow up.  Has a history of follicular lymphoma.  Sees Dr Leretha Pol.  Last checked in 02/2015.  Felt to be stable.  No evidence of reoccurrence.  Recommended f/u in one year.  She reports having some congestion and hacky cough.  Is better now.  Some runny nose.  No sob.  Has been taking tylenol cold and robitussin.  No chest pain.  Has been adjusting her diet.  Eating better.  Recording what she has been eating.  No nausea or vomiting.  Bowels stable.  Still some abdominal discomfort.  Discussed with her today.  She has been evaluated by Dr Leretha Pol and GI.  States felt no further w/up warranted.  Some increased stress.  Overall feels handling stress well.   Past Medical History  Diagnosis Date  . Follicular lymphoma ([place])     Followed by Dr Leretha Pol, s/p chemo and XRT  . Anemia     iron deficiency and B12 deficiency  . Hypercholesterolemia   . Shingles outbreak 11/26/2011  . IBS (irritable bowel syndrome)   . Chronic back pain     followed by Dr Primus Bravo  . Nephrolithiasis     followed by Dr Bernardo Heater, s/p stents  . Esophageal stricture     requiring dilatation x 2  . Hiatal hernia   . Depression   . Fibromyalgia   . GERD (gastroesophageal reflux disease)   . IBS (irritable bowel syndrome)   . Follicular lymphoma ([place])   . Fibromyalgia    Past Surgical History  Procedure Laterality Date  . Tonsilectomy/adenoidectomy with myringotomy  1970  . Appendectomy    . Tubal ligation  1976  . Vaginal hysterectomy  1980's    secondary to bleeding  . Left oophorectomy    . Ovarian cyst removal      right  . Bladder surgery  1993  . Cholecystectomy  1995  . Orif ankle fracture  2012  . Rectocele repair N/A   . Breast biopsy N/A   . Leg surgery Left   . Tonsillectomy N/A     Family History  Problem Relation Age of Onset  . Breast cancer      great aunt and grandfather  . Lung cancer      uncle  . Hypertension Maternal Grandmother   . Heart disease Maternal Grandmother     MI 47s  . Diabetes Maternal Grandmother   . Arthritis Maternal Grandmother   . Diabetes Maternal Grandfather   . Ulcers Mother   . Ulcers Father    Social History   Social History  . Marital Status: Married    Spouse Name: N/A  . Number of Children: N/A  . Years of Education: N/A   Social History Main Topics  . Smoking status: Never Smoker   . Smokeless tobacco: Never Used  . Alcohol Use: No  . Drug Use: No  . Sexual Activity: Yes   Other Topics Concern  . None   Social History Narrative    Outpatient Encounter Prescriptions as of 06/30/2015  Medication Sig  . aspirin EC 81 MG tablet Take by mouth.  Marland Kitchen azelastine (ASTELIN) 0.1 % nasal spray Place 1 spray into both nostrils 2 (two) times daily. Use in each  nostril as directed  . calcium carbonate (TUMS - DOSED IN MG ELEMENTAL CALCIUM) 500 MG chewable tablet Chew 1 tablet by mouth as needed.  . cetirizine (ZYRTEC) 10 MG tablet Take by mouth.  . conjugated estrogens (PREMARIN) vaginal cream As directed.  . cyanocobalamin 1000 MCG tablet Take 1,000 mcg by mouth daily.  . Diphenhyd-Hydrocort-Nystatin (FIRST-DUKES MOUTHWASH) SUSP Swish and spit 5 mLs every 6 (six) hours as needed for Pain.  Marland Kitchen doxycycline (VIBRA-TABS) 100 MG tablet Take 1 tablet (100 mg total) by mouth 2 (two) times daily.  Marland Kitchen escitalopram (LEXAPRO) 20 MG tablet TAKE ONE TABLET EVERY DAY  . Fe Fum-FePoly-Vit C-Vit B3 (INTEGRA) 62.5-62.5-40-3 MG CAPS Take 1 capsule by mouth daily.  . fentaNYL (DURAGESIC) 100 MCG/HR Apply 1 patch to skin every 2 days with fentanyl patch 25 g per hour if tolerated  . fentaNYL (DURAGESIC) 25 MCG/HR patch Apply 1 patch to skin every 2 days with fentanyl patch 100 g per hour if tolerated  . fluticasone (FLONASE) 50 MCG/ACT nasal  spray Place 2 sprays into both nostrils daily.  . furosemide (LASIX) 20 MG tablet Take 1 tablet (20 mg total) by mouth daily as needed.  . Omega-3 Fatty Acids (FISH OIL) 1360 MG CAPS Take 1 capsule by mouth daily.  Marland Kitchen omeprazole (PRILOSEC) 20 MG capsule Take 1 capsule (20 mg total) by mouth 2 (two) times daily.  Marland Kitchen oxyCODONE (ROXICODONE) 5 MG immediate release tablet Limit 1 tab by mouth per day or twice per day if tolerated for breakthrough pain while wearing fentanyl patches  . senna-docusate (SENOKOT-S) 8.6-50 MG per tablet Take by mouth.  . valACYclovir (VALTREX) 500 MG tablet Take 1 tablet (500 mg total) by mouth daily.  . [DISCONTINUED] chlorpheniramine-HYDROcodone (TUSSIONEX PENNKINETIC ER) 10-8 MG/5ML SUER Take 5 mLs by mouth every 12 (twelve) hours as needed.  . [DISCONTINUED] mupirocin ointment (BACTROBAN) 2 % Apply to affected areas bid   No facility-administered encounter medications on file as of 06/30/2015.    Review of Systems  Constitutional: Negative for appetite change and unexpected weight change.  HENT: Positive for congestion. Negative for sinus pressure.   Respiratory: Positive for cough. Negative for chest tightness and shortness of breath.   Cardiovascular: Negative for chest pain, palpitations and leg swelling (no increased swelling.  improved. ).  Gastrointestinal: Positive for abdominal pain. Negative for nausea, vomiting and diarrhea.  Genitourinary: Negative for dysuria and difficulty urinating.  Musculoskeletal: Positive for back pain (chronic.  sees Dr Primus Bravo. ). Negative for joint swelling.  Skin: Negative for color change and rash.  Neurological: Negative for dizziness, light-headedness and headaches.  Psychiatric/Behavioral: Negative for dysphoric mood and agitation.       Objective:    Physical Exam  Constitutional: She appears well-developed and well-nourished. No distress.  HENT:  Nose: Nose normal.  Mouth/Throat: Oropharynx is clear and moist.    Neck: Neck supple. No thyromegaly present.  Cardiovascular: Normal rate and regular rhythm.   Pulmonary/Chest: Breath sounds normal. No respiratory distress. She has no wheezes.  Abdominal: Soft. Bowel sounds are normal.  No significant tenderness to palpation.    Musculoskeletal: She exhibits no tenderness.  Minimal ankle/pedal edema.  Improved.  No swelling extending up the leg.   Lymphadenopathy:    She has no cervical adenopathy.  Skin: No rash noted. No erythema.  Psychiatric: She has a normal mood and affect. Her behavior is normal.    BP 108/74 mmHg  Pulse 83  Temp(Src) 99.2 F (37.3 C) (Oral)  Ht 5\' 7"  (1.702 m)  Wt 179 lb 12.8 oz (81.557 kg)  BMI 28.15 kg/m2  SpO2 95% Wt Readings from Last 3 Encounters:  06/30/15 179 lb 12.8 oz (81.557 kg)  06/14/15 175 lb (79.379 kg)  05/10/15 175 lb (79.379 kg)     Lab Results  Component Value Date   WBC 9.5 03/02/2015   HGB 11.4* 03/02/2015   HCT 36 03/02/2015   PLT 185 03/02/2015   GLUCOSE 101* 05/04/2015   CHOL 253* 05/04/2015   TRIG 167.0* 05/04/2015   HDL 51.30 05/04/2015   LDLDIRECT 164.2 02/11/2013   LDLCALC 168* 05/04/2015   ALT 36* 05/04/2015   AST 41* 05/04/2015   NA 142 05/04/2015   K 4.6 05/04/2015   CL 103 05/04/2015   CREATININE 0.75 05/04/2015   BUN 10 05/04/2015   CO2 35* 05/04/2015   TSH 2.01 10/27/2014   HGBA1C 5.7 10/27/2014    US Abdomen Complete  05/13/2015  CLINICAL DATA:  65 year old female with abnormal liver function tests. Prior cholecystectomy. Initial encounter. EXAM: ABDOMEN ULTRASOUND COMPLETE COMPARISON:  CT Abdomen and Pelvis 03/17/2013. FINDINGS: Gallbladder: Surgically absent. Common bile duct: Diameter: 7 mm, within normal limits in the post cholecystectomy state. Liver: Unchanged small anterior right lobe simple appearing cyst measuring 14 mm. Similar small simple appearing left lobe cyst, 9 mm. No intrahepatic biliary ductal dilatation. No other discrete liver lesion. Abnormally  increased background liver echogenicity (image 43 and image 68). IVC: No abnormality visualized. Pancreas: Incompletely visualized due to overlying bowel gas, visualized portions within normal limits. Spleen: Size and appearance within normal limits. Right Kidney: Length: 11.1 cm. Echogenicity within normal limits. No mass or hydronephrosis visualized. Left Kidney: Length: 10.1 cm. Stable small 11 mm exophytic benign midpole cyst. Echogenicity within normal limits. No mass or hydronephrosis visualized. Abdominal aorta: No aneurysm visualized. Other findings: None. IMPRESSION: 1. Fatty liver disease. 2. Otherwise negative abdomen ultrasound. Electronically Signed   By: Genevie Ann M.D.   On: 05/13/2015 10:35       Assessment & Plan:   Problem List Items Addressed This Visit    Anemia    Has seen GI recently.  See note.  Recheck cbc.       Relevant Orders   CBC with Differential/Platelet   Ferritin   Cough    Is better.  Continue nasal spray and robitussin as directed.  Follow.  Notify me if persistent.       Fibromyalgia    Stable.  Has chronic pain.  Followed by pain clinic.       Hypercholesterolemia    Persistent elevation.  Discussed trying another statin.  Had intolerance to previous.  She wants to work on diet and exercise.  Follow lipid panel.        Relevant Orders   Lipid panel   TSH   Hepatic function panel   Basic metabolic panel   Low back pain radiating to both legs    Chronic pain.  Followed by pain clinic.       Lymphoma ([place]) - Primary    Followed by Dr Leretha Pol.  Last evaluated in 02/2015.  See note.  Stable.  Recommended f/u in one year.       Overweight (BMI 25.0-29.9)    She has adjusted her diet.  Writing down her foods she is eating.  Feels better.  Walking.  Follow.  Discussed diet and exercise.           Einar Pheasant, MD

## 2015-06-30 NOTE — Progress Notes (Signed)
Pre visit review using our clinic review tool, if applicable. No additional management support is needed unless otherwise documented below in the visit note. 

## 2015-07-02 ENCOUNTER — Encounter: Payer: Self-pay | Admitting: Internal Medicine

## 2015-07-02 DIAGNOSIS — R059 Cough, unspecified: Secondary | ICD-10-CM | POA: Insufficient documentation

## 2015-07-02 DIAGNOSIS — D649 Anemia, unspecified: Secondary | ICD-10-CM | POA: Insufficient documentation

## 2015-07-02 DIAGNOSIS — R05 Cough: Secondary | ICD-10-CM | POA: Insufficient documentation

## 2015-07-02 DIAGNOSIS — E663 Overweight: Secondary | ICD-10-CM | POA: Insufficient documentation

## 2015-07-02 NOTE — Assessment & Plan Note (Signed)
Has seen GI recently.  See note.  Recheck cbc.

## 2015-07-02 NOTE — Assessment & Plan Note (Signed)
She has adjusted her diet.  Writing down her foods she is eating.  Feels better.  Walking.  Follow.  Discussed diet and exercise.

## 2015-07-02 NOTE — Assessment & Plan Note (Signed)
Followed by Dr Leretha Pol.  Last evaluated in 02/2015.  See note.  Stable.  Recommended f/u in one year.

## 2015-07-02 NOTE — Assessment & Plan Note (Signed)
Stable.  Has chronic pain.  Followed by pain clinic.

## 2015-07-02 NOTE — Assessment & Plan Note (Signed)
Persistent elevation.  Discussed trying another statin.  Had intolerance to previous.  She wants to work on diet and exercise.  Follow lipid panel.

## 2015-07-02 NOTE — Assessment & Plan Note (Signed)
Is better.  Continue nasal spray and robitussin as directed.  Follow.  Notify me if persistent.

## 2015-07-02 NOTE — Assessment & Plan Note (Signed)
Chronic pain.  Followed by pain clinic.   

## 2015-07-12 ENCOUNTER — Encounter: Payer: Self-pay | Admitting: Pain Medicine

## 2015-07-12 ENCOUNTER — Ambulatory Visit: Payer: Medicare Other | Attending: Pain Medicine | Admitting: Pain Medicine

## 2015-07-12 VITALS — BP 106/48 | HR 65 | Temp 98.5°F | Resp 15 | Ht 67.0 in | Wt 170.0 lb

## 2015-07-12 DIAGNOSIS — M47896 Other spondylosis, lumbar region: Secondary | ICD-10-CM | POA: Diagnosis not present

## 2015-07-12 DIAGNOSIS — M7060 Trochanteric bursitis, unspecified hip: Secondary | ICD-10-CM | POA: Insufficient documentation

## 2015-07-12 DIAGNOSIS — M5116 Intervertebral disc disorders with radiculopathy, lumbar region: Secondary | ICD-10-CM | POA: Insufficient documentation

## 2015-07-12 DIAGNOSIS — B0229 Other postherpetic nervous system involvement: Secondary | ICD-10-CM

## 2015-07-12 DIAGNOSIS — M5136 Other intervertebral disc degeneration, lumbar region: Secondary | ICD-10-CM

## 2015-07-12 DIAGNOSIS — M533 Sacrococcygeal disorders, not elsewhere classified: Secondary | ICD-10-CM | POA: Diagnosis not present

## 2015-07-12 DIAGNOSIS — M546 Pain in thoracic spine: Secondary | ICD-10-CM | POA: Diagnosis present

## 2015-07-12 DIAGNOSIS — M545 Low back pain: Secondary | ICD-10-CM | POA: Diagnosis not present

## 2015-07-12 DIAGNOSIS — M5416 Radiculopathy, lumbar region: Secondary | ICD-10-CM | POA: Diagnosis not present

## 2015-07-12 DIAGNOSIS — M461 Sacroiliitis, not elsewhere classified: Secondary | ICD-10-CM | POA: Diagnosis not present

## 2015-07-12 DIAGNOSIS — C8590 Non-Hodgkin lymphoma, unspecified, unspecified site: Secondary | ICD-10-CM | POA: Diagnosis not present

## 2015-07-12 DIAGNOSIS — M79604 Pain in right leg: Secondary | ICD-10-CM

## 2015-07-12 DIAGNOSIS — M47816 Spondylosis without myelopathy or radiculopathy, lumbar region: Secondary | ICD-10-CM

## 2015-07-12 DIAGNOSIS — G8929 Other chronic pain: Secondary | ICD-10-CM

## 2015-07-12 DIAGNOSIS — M542 Cervicalgia: Secondary | ICD-10-CM | POA: Diagnosis present

## 2015-07-12 DIAGNOSIS — M791 Myalgia: Secondary | ICD-10-CM | POA: Diagnosis not present

## 2015-07-12 DIAGNOSIS — M47817 Spondylosis without myelopathy or radiculopathy, lumbosacral region: Secondary | ICD-10-CM | POA: Diagnosis not present

## 2015-07-12 DIAGNOSIS — M79606 Pain in leg, unspecified: Secondary | ICD-10-CM | POA: Diagnosis present

## 2015-07-12 MED ORDER — FENTANYL 100 MCG/HR TD PT72: MEDICATED_PATCH | TRANSDERMAL | Status: DC

## 2015-07-12 MED ORDER — FENTANYL 25 MCG/HR TD PT72: MEDICATED_PATCH | TRANSDERMAL | Status: DC

## 2015-07-12 NOTE — Patient Instructions (Signed)
PLAN   Continue present medication fentanyl patches, Zanaflex, and oxycodone  F/U PCP Dr. Einar Pheasant for evaliation of  BP and general medical condition  F/U surgical evaluation.  We will avoid at this time   F/U neurological evaluation. May consider pending follow-up evaluations We will avoid PNCV/EMG studies and other studies at this time  F/U  [facility] as planned and as discussed  F/U [facility] appointment as needed  May consider radiofrequency rhizolysis or intraspinal procedures pending response to present treatment and F/U evaluation. We will avoid such treatment at this time   Please call pain management for any concerns regarding your condition prior to scheduled return appointment

## 2015-07-12 NOTE — Progress Notes (Signed)
   Subjective:    Patient ID: Maria Keller, female    DOB: Oct 07, 1950, 65 y.o.   MRN: [id number]  HPI  The patient is a 65 year old female who returns to pain management for further evaluation and treatment of pain involving the region of the neck entire back upper and lower extremity regions. The patient is with pain fairly well-controlled at this time. The patient denies any significant pain due to her history of shingles of the thoracic region, sacroiliac joint dysfunction, greater trochanteric bursitis, or degenerative disc disease of the cervical or lumbar spine. Patient is a history of lymphoma and has been under the care of Dr. Leretha Pol at [facility]. At the present time patient states the pain is well controlled. Patient continues fentanyl patch 125 g per hour with the occasional use of Zanaflex and oxycodone. The patient denies any trauma change in events of daily living the call significant change in symptomatology. We will continue presently prescribed treatment regimen and patient is to call pain management prior to scheduled return appointment should there be significant change in condition prior to scheduled return appointment. All agreed to suggested treatment plan    Review of Systems     Objective:   Physical Exam   There was tends to palpation of the paraspinal muscular treat and cervical region cervical facet region palpation which reproduces mild discomfort. There appeared to be mild tenderness of the acromioclavicular and glenohumeral joint region and patient was with unremarkable Spurling's maneuver. Tinel and Phalen's maneuver were without increase of pain of significant degree. Palpation over the thoracic region thoracic facet region was with minimal tenderness to palpation without crepitus of the thoracic region without significant muscle spasms of the thoracic paraspinal muscles region. Palpation over the region of the lumbar paraspinal muscular region  lumbar facet region was attends to palpation of moderate degree with lateral bending rotation extension and palpation of the lumbar facets reproducing moderate discomfort. There was moderate tenderness of the PSIS and PII S region as well as moderate tenderness along the greater trochanteric region iliotibial band region. No definite sensory deficit or dermatomal distribution detected. There was minimal increase of pain with pressure applied to the ileum with patient in lateral disposition. DTRs appeared to be trace at the knees. EHL strength appeared to be slightly decreased. There was negative clonus negative Homans. Nontender with no costovertebral tenderness noted     Assessment & Plan:    Degenerative disc disease lumbar spine L4-L5 level degenerative changes most significant with multilevel degenerative changes noted throughout the lumbar spine  Lumbar radiculopathy  Postherpetic neuralgia thoracic region (right side)  Sacroiliac joint dysfunction  Greater trochanteric bursitis  Lymphoma      PLAN   Continue present medication fentanyl patches, Zanaflex, and oxycodone  F/U PCP Dr. Einar Pheasant for evaliation of  BP and general medical condition  F/U surgical evaluation.  We will avoid at this time   F/U neurological evaluation. May consider pending follow-up evaluations We will avoid PNCV/EMG studies and other studies at this time  F/U  [facility] as planned and as discussed  F/U [facility] appointment as needed  May consider radiofrequency rhizolysis or intraspinal procedures pending response to present treatment and F/U evaluation. We will avoid such treatment at this time   Please call pain management for any concerns regarding your condition prior to scheduled return appointment

## 2015-07-12 NOTE — Progress Notes (Signed)
Safety precautions to be maintained throughout the outpatient stay will include: orient to surroundings, keep bed in low position, maintain call bell within reach at all times, provide assistance with transfer out of bed and ambulation.  

## 2015-07-14 ENCOUNTER — Other Ambulatory Visit: Payer: Medicare Other

## 2015-07-21 LAB — TOXASSURE SELECT 13 (MW), URINE: PDF: 0

## 2015-07-24 NOTE — Progress Notes (Signed)
Quick Note:  Reviewed. ______ 

## 2015-07-29 ENCOUNTER — Ambulatory Visit (INDEPENDENT_AMBULATORY_CARE_PROVIDER_SITE_OTHER): Payer: Medicare Other | Admitting: Family Medicine

## 2015-07-29 ENCOUNTER — Telehealth: Payer: Self-pay | Admitting: Internal Medicine

## 2015-07-29 ENCOUNTER — Encounter: Payer: Self-pay | Admitting: Family Medicine

## 2015-07-29 VITALS — BP 110/64 | HR 86 | Temp 98.7°F | Ht 67.0 in | Wt 183.0 lb

## 2015-07-29 DIAGNOSIS — J069 Acute upper respiratory infection, unspecified: Secondary | ICD-10-CM

## 2015-07-29 MED ORDER — DOXYCYCLINE HYCLATE 100 MG PO TABS: 100.0000 mg | ORAL_TABLET | Freq: Two times a day (BID) | ORAL | Status: DC

## 2015-07-29 MED ORDER — FLUCONAZOLE 150 MG PO TABS: 150.0000 mg | ORAL_TABLET | Freq: Once | ORAL | Status: DC

## 2015-07-29 NOTE — Addendum Note (Signed)
Addended by: Caryl Bis, Vinayak Bobier G on: 07/29/2015 01:50 PM   Modules accepted: Orders

## 2015-07-29 NOTE — Patient Instructions (Signed)
Nice to see you. You likely have a sinus infection or bronchitis. Less likely pneumonia. We will start you on doxycycline to treat these issues. You can continue the over-the-counter cough medicine. She can also use honey in warm tea. If you develop chest pain, shortness of breath, wheezing, cough productive of blood, persistent fevers, or any new or changing symptoms please seek medical attention.

## 2015-07-29 NOTE — Progress Notes (Signed)
Pre visit review using our clinic review tool, if applicable. No additional management support is needed unless otherwise documented below in the visit note. 

## 2015-07-29 NOTE — Progress Notes (Signed)
Patient ID: [name], female   DOB: 11/13/1950, 65 y.o.   MRN: [id number]  Tommi Rumps, MD Phone: [phone]  Maria Keller is a 65 y.o. female who presents today for same-day visit.  Patient presents with 5 days of cough, sinus congestion, and nasal congestion, and chest congestion. She notes her ears feel stopped up. Notes she has been blowing green mucus out of her nose and coughing green mucus up. Minimal streaks of blood in both yesterday though none today. MAXIMUM TEMPERATURE is 100.73F. None in the last several days. His been taking Tylenol. Over-the-counter cough and cold medicine. Minimal shortness of breath with congestion though no chest pain. No wheezing.  PMH: nonsmoker.   ROS see history of present illness  Objective  Physical Exam Filed Vitals:   07/29/15 1307  BP: 110/64  Pulse: 86  Temp: 98.7 F (37.1 C)    BP Readings from Last 3 Encounters:  07/29/15 110/64  07/12/15 106/48  06/30/15 108/74   Wt Readings from Last 3 Encounters:  07/29/15 183 lb (83.008 kg)  07/12/15 170 lb (77.111 kg)  06/30/15 179 lb 12.8 oz (81.557 kg)    Physical Exam  Constitutional: She is well-developed, well-nourished, and in no distress.  HENT:  Head: Normocephalic and atraumatic.  Right Ear: External ear normal.  Left Ear: External ear normal.  Mouth/Throat: Oropharynx is clear and moist. No oropharyngeal exudate.  Normal TMs bilaterally  Eyes: Conjunctivae are normal. Pupils are equal, round, and reactive to light.  Cardiovascular: Normal rate, regular rhythm and normal heart sounds.   Pulmonary/Chest: Effort normal and breath sounds normal.  Musculoskeletal: She exhibits no edema.  Neurological: She is alert. Gait normal.  Skin: Skin is warm and dry. She is not diaphoretic.     Assessment/Plan: Please see individual problem list.  Acute upper respiratory infection Patient with 5 days of upper respiratory symptoms with purulent nasal discharge and purulent  productive cough. Benign lung exam. Vital signs are stable. Suspect bacterial sinus infection or bronchitis. Given history of lymphoma have low threshold to treat with antibiotics in this patient. We will start on doxycycline given allergies to penicillins. She will continue over-the-counter cough medication. Honey in warm tea as well. She'll continue to monitor. If not improving next 2-3 days she'll follow-up. Given return precautions.    No orders of the defined types were placed in this encounter.    Meds ordered this encounter  Medications  . doxycycline (VIBRA-TABS) 100 MG tablet    Sig: Take 1 tablet (100 mg total) by mouth 2 (two) times daily.    Dispense:  14 tablet    Refill:  0    Tommi Rumps, MD [place]

## 2015-07-29 NOTE — Assessment & Plan Note (Signed)
Patient with 5 days of upper respiratory symptoms with purulent nasal discharge and purulent productive cough. Benign lung exam. Vital signs are stable. Suspect bacterial sinus infection or bronchitis. Given history of lymphoma have low threshold to treat with antibiotics in this patient. We will start on doxycycline given allergies to penicillins. She will continue over-the-counter cough medication. Honey in warm tea as well. She'll continue to monitor. If not improving next 2-3 days she'll follow-up. Given return precautions.

## 2015-07-29 NOTE — Telephone Encounter (Signed)
Pt called wanting to get a Rx called in for a yeast infection medication is Diflucan.   Pharmacy is [facility], [place].  Call pt @ [phone]. Thank you!

## 2015-07-29 NOTE — Telephone Encounter (Signed)
Notified patient that RX was sent to pharmacy

## 2015-08-06 DIAGNOSIS — J40 Bronchitis, not specified as acute or chronic: Secondary | ICD-10-CM

## 2015-08-06 HISTORY — DX: Bronchitis, not specified as acute or chronic: J40

## 2015-08-11 ENCOUNTER — Ambulatory Visit: Payer: Medicare Other | Admitting: Pain Medicine

## 2015-08-12 ENCOUNTER — Encounter: Payer: Self-pay | Admitting: Pain Medicine

## 2015-08-12 ENCOUNTER — Ambulatory Visit: Payer: Medicare Other | Attending: Pain Medicine | Admitting: Pain Medicine

## 2015-08-12 VITALS — BP 96/55 | HR 66 | Temp 98.4°F | Resp 16 | Ht 67.5 in | Wt 175.0 lb

## 2015-08-12 DIAGNOSIS — M47817 Spondylosis without myelopathy or radiculopathy, lumbosacral region: Secondary | ICD-10-CM | POA: Diagnosis not present

## 2015-08-12 DIAGNOSIS — M545 Low back pain, unspecified: Secondary | ICD-10-CM

## 2015-08-12 DIAGNOSIS — M7060 Trochanteric bursitis, unspecified hip: Secondary | ICD-10-CM | POA: Diagnosis not present

## 2015-08-12 DIAGNOSIS — M5116 Intervertebral disc disorders with radiculopathy, lumbar region: Secondary | ICD-10-CM | POA: Insufficient documentation

## 2015-08-12 DIAGNOSIS — M5136 Other intervertebral disc degeneration, lumbar region: Secondary | ICD-10-CM

## 2015-08-12 DIAGNOSIS — M47816 Spondylosis without myelopathy or radiculopathy, lumbar region: Secondary | ICD-10-CM

## 2015-08-12 DIAGNOSIS — M533 Sacrococcygeal disorders, not elsewhere classified: Secondary | ICD-10-CM

## 2015-08-12 DIAGNOSIS — M542 Cervicalgia: Secondary | ICD-10-CM | POA: Diagnosis present

## 2015-08-12 DIAGNOSIS — M461 Sacroiliitis, not elsewhere classified: Secondary | ICD-10-CM | POA: Diagnosis not present

## 2015-08-12 DIAGNOSIS — C8590 Non-Hodgkin lymphoma, unspecified, unspecified site: Secondary | ICD-10-CM | POA: Diagnosis not present

## 2015-08-12 DIAGNOSIS — B0229 Other postherpetic nervous system involvement: Secondary | ICD-10-CM | POA: Insufficient documentation

## 2015-08-12 DIAGNOSIS — M79606 Pain in leg, unspecified: Secondary | ICD-10-CM | POA: Diagnosis present

## 2015-08-12 DIAGNOSIS — G8929 Other chronic pain: Secondary | ICD-10-CM

## 2015-08-12 DIAGNOSIS — M5416 Radiculopathy, lumbar region: Secondary | ICD-10-CM

## 2015-08-12 DIAGNOSIS — M47896 Other spondylosis, lumbar region: Secondary | ICD-10-CM | POA: Diagnosis not present

## 2015-08-12 DIAGNOSIS — M791 Myalgia: Secondary | ICD-10-CM | POA: Diagnosis not present

## 2015-08-12 MED ORDER — FENTANYL 100 MCG/HR TD PT72: MEDICATED_PATCH | TRANSDERMAL | Status: DC

## 2015-08-12 MED ORDER — FENTANYL 25 MCG/HR TD PT72: MEDICATED_PATCH | TRANSDERMAL | Status: DC

## 2015-08-12 NOTE — Patient Instructions (Signed)
PLAN   Continue present medication fentanyl patches, Zanaflex, and oxycodone  F/U PCP Dr. Einar Pheasant for evaliation of  BP and general medical condition. BP is low. Please follow-up with Dr.Scott as discussed   F/U surgical evaluation.  We will avoid at this time   F/U neurological evaluation. May consider pending follow-up evaluations We will avoid PNCV/EMG studies and other studies at this time  F/U  [facility] as planned and as discussed  F/U [facility] appointment as needed  May consider radiofrequency rhizolysis or intraspinal procedures pending response to present treatment and F/U evaluation. We will avoid such treatment at this time   Please call pain management for any concerns regarding your condition prior to scheduled return appointment

## 2015-08-12 NOTE — Progress Notes (Signed)
Safety precautions to be maintained throughout the outpatient stay will include: orient to surroundings, keep bed in low position, maintain call bell within reach at all times, provide assistance with transfer out of bed and ambulation.  

## 2015-08-12 NOTE — Progress Notes (Signed)
   Subjective:    Patient ID: Maria Keller, female    DOB: 1950/04/13, 65 y.o.   MRN: [id number]  HPI  The patient is a 65 year old female who returns to pain management for further evaluation and treatment of pain involving the region of the neck upper extremity regions mid back lower back and lower extremity region. The patient states that she is doing very well at this time and denies any significantly debilitating pain. The patient does continue to have component of bursitis which she states is fairly well-controlled at this time as well. We discussed patient's condition and will continue fentanyl patches as prescribed and patient will take Zanaflex and oxycodone as discussed and as needed. We will avoid interventional treatment as discussed. All agreed to suggested treatment plan. The patient denies trauma change in events of daily living the call significant change in symptomatology  Review of Systems     Objective:   Physical Exam  There was tenderness of the splenius capitis and occipitalis region palpation which reproduces pain of mild-to-moderate degree with mild to moderate tenderness of the cervical facet cervical paraspinal musculature region. Palpation of the acromioclavicular and glenohumeral joint regions reproduces minimal discomfort and patient was with bilaterally equal grip strength with Tinel and Phalen's maneuver reproducing minimal discomfort. Palpation over the region of the thoracic region thoracic facet region was with tenderness to palpation with no crepitus of the thoracic region noted. There was mild tenderness of the thoracic region in the area of previous shingles without allodynia noted There was tenderness over the region of the PSIS and PII S regions as well palpation of the greater trochanteric region iliotibial band region was with tenderness to palpation with straight leg raising tolerates it 30 without increased pain with dorsiflexion noted. EHL strength appeared  to be decreased. There was negative clonus negative Homans. There was moderate tenderness of the PSIS PII S region as well as the greater trochanteric region iliotibial band region. DTRs appeared to be trace at the knees. EHL strength appeared to be equal andsensory deficit or dermatomal distribution detected. There was negative clonus negative Homans. Abdomen nontender with no costovertebral tenderness noted      Assessment & Plan:    Degenerative disc disease lumbar spine L4-L5 level degenerative changes most significant with multilevel degenerative changes noted throughout the lumbar spine  Lumbar radiculopathy  Postherpetic neuralgia thoracic region (right side)  Sacroiliac joint dysfunction  Greater trochanteric bursitis  Lymphoma        PLAN   Continue present medication fentanyl patches, Zanaflex, and oxycodone  F/U PCP Dr. Einar Pheasant for evaliation of  BP and general medical condition. BP is low. Please follow-up with Dr.Scott as discussed   F/U surgical evaluation.  We will avoid at this time   F/U neurological evaluation. May consider pending follow-up evaluations We will avoid PNCV/EMG studies and other studies at this time  F/U  [facility] as planned and as discussed  F/U [facility] appointment as needed  May consider radiofrequency rhizolysis or intraspinal procedures pending response to present treatment and F/U evaluation. We will avoid such treatment at this time   Please call pain management for any concerns regarding your condition prior to scheduled return appointment

## 2015-08-15 ENCOUNTER — Ambulatory Visit (INDEPENDENT_AMBULATORY_CARE_PROVIDER_SITE_OTHER): Payer: Medicare Other | Admitting: Family Medicine

## 2015-08-15 ENCOUNTER — Encounter: Payer: Self-pay | Admitting: Family Medicine

## 2015-08-15 VITALS — BP 118/64 | HR 95 | Temp 98.7°F | Ht 67.5 in | Wt 179.2 lb

## 2015-08-15 DIAGNOSIS — J069 Acute upper respiratory infection, unspecified: Secondary | ICD-10-CM

## 2015-08-15 MED ORDER — ERYTHROMYCIN 5 MG/GM OP OINT: 1.0000 "application " | TOPICAL_OINTMENT | Freq: Three times a day (TID) | OPHTHALMIC | Status: DC

## 2015-08-15 MED ORDER — DOXYCYCLINE HYCLATE 100 MG PO TABS: 100.0000 mg | ORAL_TABLET | Freq: Two times a day (BID) | ORAL | Status: DC

## 2015-08-15 MED ORDER — FLUCONAZOLE 150 MG PO TABS: 150.0000 mg | ORAL_TABLET | Freq: Once | ORAL | Status: DC

## 2015-08-15 NOTE — Progress Notes (Signed)
Pre visit review using our clinic review tool, if applicable. No additional management support is needed unless otherwise documented below in the visit note. 

## 2015-08-15 NOTE — Progress Notes (Signed)
Patient ID: [name], female   DOB: 25-Aug-1950, 66 y.o.   MRN: [id number]  Tommi Rumps, MD Phone: [phone]  Maria Keller is a 65 y.o. female who presents today for same-day visit.  Patient notes she woke up Saturday morning with severe congestion in her maxillary and frontal sinuses. Notes she's been blowing green/yellow mucus out of her nose. Also coughing and coughing up similar mucus. No fevers. No shortness of breath. Does note her left eye has been draining clear fluid and has been matted shut. And itching scratchy sensation in her eyes as well though no foreign body sensation. No vision change. No eye pain. No photophobia. Patient reports she did improve completely after her last course of antibiotics.  PMH: nonsmoker    ROS see history of present illness  Objective  Physical Exam Filed Vitals:   08/15/15 1050  BP: 118/64  Pulse: 95  Temp: 98.7 F (37.1 C)    BP Readings from Last 3 Encounters:  08/15/15 118/64  08/12/15 96/55  07/29/15 110/64   Wt Readings from Last 3 Encounters:  08/15/15 179 lb 3.2 oz (81.285 kg)  08/12/15 175 lb (79.379 kg)  07/29/15 183 lb (83.008 kg)    Physical Exam  Constitutional: She is well-developed, well-nourished, and in no distress.  HENT:  Head: Normocephalic and atraumatic.  Right Ear: External ear normal.  Left Ear: External ear normal.  Mouth/Throat: Oropharynx is clear and moist. No oropharyngeal exudate.  Normal TMs bilaterally, bilateral maxillary and frontal sinuses tender to percussion  Eyes: Pupils are equal, round, and reactive to light.  Minimal conjunctival erythema with clear drainage from the left eye, right eye with minimal conjunctival erythema, no obvious corneal abnormalities on gross exam, pupils equal and reactive to light, no ciliary flush  Cardiovascular: Normal rate, regular rhythm and normal heart sounds.   Pulmonary/Chest: Effort normal and breath sounds normal.  Neurological: She is alert. Gait  normal.  Skin: Skin is warm and dry. She is not diaphoretic.    Visual Acuity Screening   Right eye Left eye Both eyes  Without correction:     With correction: 20/40 20/40 20/30     Assessment/Plan: Please see individual problem list.  Acute upper respiratory infection Patient with recurrence of upper respiratory infection. Given purulent nature and severity of congestion suspect bacterial illness. She reports her prior symptoms resolved completely and she felt 100% better after her course of doxycycline. We will retreat with doxycycline given that she did improve completely. If not improving in the next 2-3 days would need to consider Levaquin given her penicillin allergy and her ability to tolerate Levaquin. Does have conjunctivitis of the left eye. No red flag eye symptoms today. Vision equal in both eyes. Suspect likely viral or bacterial in nature and will treat with erythromycin ophthalmic ointment. She will continue to monitor. Advised if she developed any further eye symptoms or if she develops shortness of breath, cough productive of blood, or fever she should seek medical attention. Given return precautions.  Diflucan in case of yeast infection with antiobiotic. Advised to take probiotic while on antibiotic.   No orders of the defined types were placed in this encounter.    Meds ordered this encounter  Medications  . doxycycline (VIBRA-TABS) 100 MG tablet    Sig: Take 1 tablet (100 mg total) by mouth 2 (two) times daily.    Dispense:  20 tablet    Refill:  0  . fluconazole (DIFLUCAN) 150 MG tablet  Sig: Take 1 tablet (150 mg total) by mouth once.    Dispense:  1 tablet    Refill:  0  . erythromycin ophthalmic ointment    Sig: Place 1 application into the left eye 3 (three) times daily.    Dispense:  3.5 g    Refill:  0     Tommi Rumps, MD [place]

## 2015-08-15 NOTE — Assessment & Plan Note (Addendum)
Patient with recurrence of upper respiratory infection. Given purulent nature and severity of congestion suspect bacterial illness. She reports her prior symptoms resolved completely and she felt 100% better after her course of doxycycline. We will retreat with doxycycline given that she did improve completely. If not improving in the next 2-3 days would need to consider Levaquin given her penicillin allergy and her ability to tolerate Levaquin. Does have conjunctivitis of the left eye. No red flag eye symptoms today. Vision equal in both eyes. Suspect likely viral or bacterial in nature and will treat with erythromycin ophthalmic ointment. She will continue to monitor. Advised if she developed any further eye symptoms or if she develops shortness of breath, cough productive of blood, or fever she should seek medical attention. Given return precautions.

## 2015-08-15 NOTE — Patient Instructions (Signed)
Nice to see you. We will treat you with doxycycline for sinus infection. We will treat with erythromycin ointment for your eye. We will also get the Diflucan for possible yeast infection with the doxycycline. You should take a probiotic or eat some yogurt with this. If you develop trouble breathing, cough productive of blood, fevers, foreign body sensation in your eye, eye pain, eye redness that is worsening, vision changes, or any new or changing symptoms please seek medical attention.

## 2015-08-22 ENCOUNTER — Telehealth: Payer: Self-pay | Admitting: *Deleted

## 2015-08-22 NOTE — Telephone Encounter (Signed)
Was seen on 7/10, please advise as she is not any better. She is on the second round of antibiotics, thanks

## 2015-08-22 NOTE — Telephone Encounter (Signed)
Pt stated that she was advised by Dr. Caryl Bis to call the office if her congestion did not clear up. She's on her second round of antibiotics. She requested a call in reference to the congestion.  Pt contact [phone]

## 2015-08-22 NOTE — Telephone Encounter (Signed)
I would suggest reevaluation as she will likely need a different antibiotic. She can be placed in any 15 minute visit on my schedule on Tuesday.

## 2015-08-23 DIAGNOSIS — H04202 Unspecified epiphora, left lacrimal gland: Secondary | ICD-10-CM | POA: Diagnosis not present

## 2015-08-23 DIAGNOSIS — H18453 Nodular corneal degeneration, bilateral: Secondary | ICD-10-CM | POA: Diagnosis not present

## 2015-08-23 DIAGNOSIS — H1032 Unspecified acute conjunctivitis, left eye: Secondary | ICD-10-CM | POA: Diagnosis not present

## 2015-08-23 NOTE — Telephone Encounter (Signed)
Please schedule for a 15 minute acute, thanks

## 2015-08-23 NOTE — Telephone Encounter (Signed)
scheduled

## 2015-08-24 ENCOUNTER — Ambulatory Visit (INDEPENDENT_AMBULATORY_CARE_PROVIDER_SITE_OTHER): Payer: Medicare Other | Admitting: Family Medicine

## 2015-08-24 ENCOUNTER — Encounter: Payer: Self-pay | Admitting: Family Medicine

## 2015-08-24 VITALS — BP 122/84 | HR 72 | Temp 98.6°F | Ht 67.5 in | Wt 182.8 lb

## 2015-08-24 DIAGNOSIS — J069 Acute upper respiratory infection, unspecified: Secondary | ICD-10-CM

## 2015-08-24 MED ORDER — LEVOFLOXACIN 750 MG PO TABS: 750.0000 mg | ORAL_TABLET | Freq: Every day | ORAL | Status: DC

## 2015-08-24 NOTE — Progress Notes (Signed)
  Tommi Rumps, MD Phone: [phone]  Maria Keller is a 65 y.o. female who presents today for follow-up.  Patient has been seen twice in the previous month for sinus infections. Improved after the first one recurred. Treat with doxycycline both times. She notes she feels somewhat better though still has a fair amount of congestion and sinus pressure in her maxillary sinuses. Still blowing some yellow mucus out of her nose. Still coughing at night though is nonproductive. She did see ophthalmologist who gave her some additional eye drops for dry eyes and felt as though her eye symptoms would improve with improvement in her upper respiratory symptoms. No fevers or shortness of breath.   ROS see history of present illness  Objective  Physical Exam Filed Vitals:   08/24/15 1022  BP: 122/84  Pulse: 72  Temp: 98.6 F (37 C)    BP Readings from Last 3 Encounters:  08/24/15 122/84  08/15/15 118/64  08/12/15 96/55   Wt Readings from Last 3 Encounters:  08/24/15 182 lb 12.8 oz (82.918 kg)  08/15/15 179 lb 3.2 oz (81.285 kg)  08/12/15 175 lb (79.379 kg)    Physical Exam  Constitutional: No distress.  HENT:  Head: Normocephalic and atraumatic.  Right Ear: External ear normal.  Left Ear: External ear normal.  Mouth/Throat: Oropharynx is clear and moist. No oropharyngeal exudate.  Normal TMs bilaterally, bilateral maxillary sinuses tender to percussion  Eyes: Conjunctivae are normal. Pupils are equal, round, and reactive to light.  Neck: Neck supple.  Cardiovascular: Normal rate, regular rhythm and normal heart sounds.   Pulmonary/Chest: Effort normal and breath sounds normal.  Lymphadenopathy:    She has no cervical adenopathy.  Skin: She is not diaphoretic.     Assessment/Plan: Please see individual problem list.  Acute upper respiratory infection Patient with persistent symptoms that are only mildly improved. Given lack of significant improvement I discussed the option  of trying an alternative antibiotic. She is allergic to penicillins so Augmentin is not an option. She has an allergy to ciprofloxacin though can tolerate Levaquin. No anaphylactic allergy to fluoroquinolones. We'll start her on Levaquin to treat for persistent sinus infection despite antibiotic therapy. I discussed risk of tendon rupture and neuropathy with Levaquin. She will continue to monitor and if not improving with this would need to consider referral to ENT. Given return precautions.    No orders of the defined types were placed in this encounter.    Meds ordered this encounter  Medications  . levofloxacin (LEVAQUIN) 750 MG tablet    Sig: Take 1 tablet (750 mg total) by mouth daily.    Dispense:  5 tablet    Refill:  0    Tommi Rumps, MD [place]

## 2015-08-24 NOTE — Patient Instructions (Signed)
Nice to see you. Given that you're not significantly improved after 10 days of doxycycline we will change you to Levaquin to cover for persistent sinus infection. If you develop fevers, shortness of breath, cough productive of blood, or any new or changing symptoms please seek medical attention.

## 2015-08-24 NOTE — Assessment & Plan Note (Signed)
Patient with persistent symptoms that are only mildly improved. Given lack of significant improvement I discussed the option of trying an alternative antibiotic. She is allergic to penicillins so Augmentin is not an option. She has an allergy to ciprofloxacin though can tolerate Levaquin. No anaphylactic allergy to fluoroquinolones. We'll start her on Levaquin to treat for persistent sinus infection despite antibiotic therapy. I discussed risk of tendon rupture and neuropathy with Levaquin. She will continue to monitor and if not improving with this would need to consider referral to ENT. Given return precautions.

## 2015-08-24 NOTE — Progress Notes (Signed)
Pre visit review using our clinic review tool, if applicable. No additional management support is needed unless otherwise documented below in the visit note. 

## 2015-09-13 ENCOUNTER — Ambulatory Visit: Payer: Medicare Other | Attending: Pain Medicine | Admitting: Pain Medicine

## 2015-09-13 ENCOUNTER — Encounter: Payer: Self-pay | Admitting: Pain Medicine

## 2015-09-13 VITALS — BP 102/41 | HR 76 | Temp 98.8°F | Resp 14 | Ht 67.5 in | Wt 175.0 lb

## 2015-09-13 DIAGNOSIS — M533 Sacrococcygeal disorders, not elsewhere classified: Secondary | ICD-10-CM | POA: Insufficient documentation

## 2015-09-13 DIAGNOSIS — M791 Myalgia: Secondary | ICD-10-CM | POA: Diagnosis not present

## 2015-09-13 DIAGNOSIS — M47896 Other spondylosis, lumbar region: Secondary | ICD-10-CM | POA: Diagnosis not present

## 2015-09-13 DIAGNOSIS — B0229 Other postherpetic nervous system involvement: Secondary | ICD-10-CM | POA: Diagnosis not present

## 2015-09-13 DIAGNOSIS — M542 Cervicalgia: Secondary | ICD-10-CM | POA: Diagnosis present

## 2015-09-13 DIAGNOSIS — M5116 Intervertebral disc disorders with radiculopathy, lumbar region: Secondary | ICD-10-CM | POA: Insufficient documentation

## 2015-09-13 DIAGNOSIS — M546 Pain in thoracic spine: Secondary | ICD-10-CM | POA: Diagnosis present

## 2015-09-13 DIAGNOSIS — M7060 Trochanteric bursitis, unspecified hip: Secondary | ICD-10-CM | POA: Diagnosis not present

## 2015-09-13 DIAGNOSIS — M47816 Spondylosis without myelopathy or radiculopathy, lumbar region: Secondary | ICD-10-CM

## 2015-09-13 DIAGNOSIS — M5416 Radiculopathy, lumbar region: Secondary | ICD-10-CM

## 2015-09-13 DIAGNOSIS — M62830 Muscle spasm of back: Secondary | ICD-10-CM | POA: Diagnosis not present

## 2015-09-13 DIAGNOSIS — M5136 Other intervertebral disc degeneration, lumbar region: Secondary | ICD-10-CM

## 2015-09-13 DIAGNOSIS — M79606 Pain in leg, unspecified: Secondary | ICD-10-CM | POA: Diagnosis present

## 2015-09-13 DIAGNOSIS — M461 Sacroiliitis, not elsewhere classified: Secondary | ICD-10-CM | POA: Diagnosis not present

## 2015-09-13 DIAGNOSIS — M47817 Spondylosis without myelopathy or radiculopathy, lumbosacral region: Secondary | ICD-10-CM | POA: Diagnosis not present

## 2015-09-13 DIAGNOSIS — G8929 Other chronic pain: Secondary | ICD-10-CM

## 2015-09-13 MED ORDER — FENTANYL 25 MCG/HR TD PT72: MEDICATED_PATCH | TRANSDERMAL | 0 refills | Status: DC

## 2015-09-13 MED ORDER — FENTANYL 100 MCG/HR TD PT72: MEDICATED_PATCH | TRANSDERMAL | 0 refills | Status: DC

## 2015-09-13 NOTE — Progress Notes (Signed)
    The patient is a 65 year old female who returns to pain management for further evaluation and treatment of pain involving the region of the neck entire back upper and lower extremity regions. The patient is with pain fairly well-controlled at this time. The patient denies trauma change in events of daily living the call significant change in symptomatology. Patient has pain fairly well-controlled with fentanyl patches with Zanaflex and oxycodone as well. We discussed patient's condition and decision was made to continue to observe patient's response to present treatment regimen and to avoid interventional treatment. All were understanding and agreed to suggested treatment plan    Physical examination  There was tenderness of the splenius capitis and occipitalis region a mild degree with mild tenderness of the cervical facet cervical paraspinal musculature region. Palpation of the acromioclavicular and glenohumeral joint regions reproduce mild discomfort and patient was able to perform drop test with mild difficulty. The patient appeared to be with bilaterally equal grip strength without increased pain with Tinel and Phalen's maneuver. Palpation of the thoracic region was attends to palpation of moderate degree with evidence of muscle spasms involving the thoracic musculature region. No crepitus of the thoracic region was noted. Palpation over the lumbar region was attends to palpation of moderate degree with lateral bending rotation extension and palpation of the lumbar facets reproducing moderate discomfort. Straight leg raise was tolerated to 30 without increased pain with dorsiflexion noted. DTRs were difficult to elicit and appeared to be trace at the knees. There was negative clonus negative Homans. No sensory deficit or dermatomal distribution detected. Of the PSIS and PII S region reproduces moderate discomfort with mild to moderate tenderness of the greater trochanteric region iliotibial band  region. No sensory deficit or dermatomal distribution detected. There was no abdominal tends to palpation and no costovertebral tenderness noted     Assessment   Degenerative disc disease lumbar spine L4-L5 level degenerative changes most significant with multilevel degenerative changes noted throughout the lumbar spine  Lumbar radiculopathy  Postherpetic neuralgia thoracic region (right side)  Sacroiliac joint dysfunction  Greater trochanteric bursitis     PLAN   Continue present medication fentanyl patches, Zanaflex, and oxycodone  F/U PCP Dr. Einar Pheasant for evaliation of  BP and general medical condition.   F/U surgical evaluation.  We will avoid at this time   F/U neurological evaluation. May consider pending follow-up evaluations We will avoid PNCV/EMG studies and other studies at this time  F/U  [facility] as planned and as discussed  F/U [facility] appointment as needed  May consider radiofrequency rhizolysis or intraspinal procedures pending response to present treatment and F/U evaluation. We will avoid such treatment at this time   Please call pain management for any concerns regarding your condition prior to scheduled return appointment   Lymphoma

## 2015-09-13 NOTE — Patient Instructions (Signed)
PLAN   Continue present medication fentanyl patches, Zanaflex, and oxycodone  F/U PCP Dr. Einar Pheasant for evaliation of  BP and general medical condition.   F/U surgical evaluation.  We will avoid at this time   F/U neurological evaluation. May consider pending follow-up evaluations We will avoid PNCV/EMG studies and other studies at this time  F/U  [facility] as planned and as discussed  F/U [facility] appointment as needed  May consider radiofrequency rhizolysis or intraspinal procedures pending response to present treatment and F/U evaluation. We will avoid such treatment at this time   Please call pain management for any concerns regarding your condition prior to scheduled return appointment

## 2015-09-30 ENCOUNTER — Telehealth: Payer: Self-pay | Admitting: Internal Medicine

## 2015-09-30 DIAGNOSIS — M79604 Pain in right leg: Secondary | ICD-10-CM

## 2015-09-30 DIAGNOSIS — M545 Low back pain: Secondary | ICD-10-CM

## 2015-09-30 NOTE — Telephone Encounter (Signed)
Pt would like to have her magnesium levels checked when she comes for labs on Monday.. Please advise

## 2015-09-30 NOTE — Telephone Encounter (Signed)
Patient stated her massage therapist told her due to her back pain she should get her magnesium levels checked.

## 2015-09-30 NOTE — Telephone Encounter (Signed)
Need diagnosis so that I can order.  What symptoms having?

## 2015-10-02 NOTE — Telephone Encounter (Signed)
Magnesium lab added to orders for Monday.

## 2015-10-03 ENCOUNTER — Other Ambulatory Visit (INDEPENDENT_AMBULATORY_CARE_PROVIDER_SITE_OTHER): Payer: Medicare Other

## 2015-10-03 ENCOUNTER — Encounter (INDEPENDENT_AMBULATORY_CARE_PROVIDER_SITE_OTHER): Payer: Self-pay

## 2015-10-03 DIAGNOSIS — E7800 Pure hypercholesterolemia, unspecified: Secondary | ICD-10-CM

## 2015-10-03 DIAGNOSIS — M545 Low back pain: Secondary | ICD-10-CM | POA: Diagnosis not present

## 2015-10-03 DIAGNOSIS — D649 Anemia, unspecified: Secondary | ICD-10-CM

## 2015-10-03 DIAGNOSIS — M79605 Pain in left leg: Secondary | ICD-10-CM

## 2015-10-03 LAB — CBC WITH DIFFERENTIAL/PLATELET
BASOS ABS: 0 10*3/uL (ref 0.0–0.1)
BASOS PCT: 0.4 % (ref 0.0–3.0)
EOS ABS: 0.1 10*3/uL (ref 0.0–0.7)
Eosinophils Relative: 1.2 % (ref 0.0–5.0)
HEMATOCRIT: 36 % (ref 36.0–46.0)
HEMOGLOBIN: 12 g/dL (ref 12.0–15.0)
LYMPHS PCT: 45.1 % (ref 12.0–46.0)
Lymphs Abs: 3.9 10*3/uL (ref 0.7–4.0)
MCHC: 33.2 g/dL (ref 30.0–36.0)
MCV: 79.1 fl (ref 78.0–100.0)
MONO ABS: 0.5 10*3/uL (ref 0.1–1.0)
Monocytes Relative: 5.9 % (ref 3.0–12.0)
Neutro Abs: 4.1 10*3/uL (ref 1.4–7.7)
Neutrophils Relative %: 47.4 % (ref 43.0–77.0)
Platelets: 205 10*3/uL (ref 150.0–400.0)
RBC: 4.56 Mil/uL (ref 3.87–5.11)
RDW: 15.4 % (ref 11.5–15.5)
WBC: 8.6 10*3/uL (ref 4.0–10.5)

## 2015-10-03 LAB — BASIC METABOLIC PANEL
BUN: 10 mg/dL (ref 6–23)
CALCIUM: 9.3 mg/dL (ref 8.4–10.5)
CO2: 36 meq/L — AB (ref 19–32)
CREATININE: 0.76 mg/dL (ref 0.40–1.20)
Chloride: 101 mEq/L (ref 96–112)
GFR: 80.72 mL/min (ref >60.00)
Glucose, Bld: 99 mg/dL (ref 70–99)
Potassium: 4 mEq/L (ref 3.5–5.1)
SODIUM: 142 meq/L (ref 135–145)

## 2015-10-03 LAB — LIPID PANEL
CHOL/HDL RATIO: 5
Cholesterol: 258 mg/dL — ABNORMAL HIGH (ref 0–200)
HDL: 49.4 mg/dL (ref >39.00)
LDL CALC: 181 mg/dL — AB (ref 0–99)
NonHDL: 209.03
TRIGLYCERIDES: 141 mg/dL (ref 0.0–149.0)
VLDL: 28.2 mg/dL (ref 0.0–40.0)

## 2015-10-03 LAB — HEPATIC FUNCTION PANEL
ALBUMIN: 4.3 g/dL (ref 3.5–5.2)
ALK PHOS: 79 U/L (ref 39–117)
ALT: 21 U/L (ref 0–35)
AST: 26 U/L (ref 0–37)
Bilirubin, Direct: 0 mg/dL (ref 0.0–0.3)
TOTAL PROTEIN: 7.3 g/dL (ref 6.0–8.3)
Total Bilirubin: 0.4 mg/dL (ref 0.2–1.2)

## 2015-10-03 LAB — FERRITIN: FERRITIN: 14.5 ng/mL (ref 10.0–291.0)

## 2015-10-03 LAB — TSH: TSH: 1.55 u[IU]/mL (ref 0.35–4.50)

## 2015-10-05 ENCOUNTER — Telehealth: Payer: Self-pay | Admitting: *Deleted

## 2015-10-06 ENCOUNTER — Encounter: Payer: Self-pay | Admitting: Internal Medicine

## 2015-10-06 ENCOUNTER — Telehealth: Payer: Self-pay

## 2015-10-06 ENCOUNTER — Ambulatory Visit (INDEPENDENT_AMBULATORY_CARE_PROVIDER_SITE_OTHER): Payer: Medicare Other | Admitting: Internal Medicine

## 2015-10-06 ENCOUNTER — Ambulatory Visit (INDEPENDENT_AMBULATORY_CARE_PROVIDER_SITE_OTHER): Payer: Medicare Other

## 2015-10-06 VITALS — BP 104/60 | HR 80 | Temp 98.8°F | Resp 16 | Wt 179.0 lb

## 2015-10-06 DIAGNOSIS — K219 Gastro-esophageal reflux disease without esophagitis: Secondary | ICD-10-CM

## 2015-10-06 DIAGNOSIS — R0602 Shortness of breath: Secondary | ICD-10-CM

## 2015-10-06 DIAGNOSIS — C8580 Other specified types of non-Hodgkin lymphoma, unspecified site: Secondary | ICD-10-CM | POA: Diagnosis not present

## 2015-10-06 DIAGNOSIS — E7800 Pure hypercholesterolemia, unspecified: Secondary | ICD-10-CM

## 2015-10-06 DIAGNOSIS — M25561 Pain in right knee: Secondary | ICD-10-CM

## 2015-10-06 DIAGNOSIS — E663 Overweight: Secondary | ICD-10-CM | POA: Diagnosis not present

## 2015-10-06 DIAGNOSIS — G8929 Other chronic pain: Secondary | ICD-10-CM

## 2015-10-06 DIAGNOSIS — D649 Anemia, unspecified: Secondary | ICD-10-CM

## 2015-10-06 DIAGNOSIS — M549 Dorsalgia, unspecified: Secondary | ICD-10-CM

## 2015-10-06 DIAGNOSIS — M797 Fibromyalgia: Secondary | ICD-10-CM

## 2015-10-06 LAB — MAGNESIUM: Magnesium: 1.9 mg/dL (ref 1.5–2.5)

## 2015-10-06 MED ORDER — ROSUVASTATIN CALCIUM 5 MG PO TABS: 5.0000 mg | ORAL_TABLET | Freq: Every day | ORAL | 1 refills | Status: DC

## 2015-10-06 NOTE — Telephone Encounter (Signed)
-----   Message from Einar Pheasant, MD sent at 10/06/2015  2:40 PM EDT ----- Notify pt that cxr reveals no pneumonia and no active cardiopulmonary disease.  - ok.

## 2015-10-06 NOTE — Progress Notes (Signed)
Patient ID: Maria Keller, female   DOB: 1950-05-15, 65 y.o.   MRN: [id number]   Subjective:    Patient ID: Maria Keller, female    DOB: 06-23-1950, 65 y.o.   MRN: [id number]  HPI  Patient here for a scheduled follow up.   She reports she has noticed sob with exertion.  No chest pain.  Had recent cough and congestion.  Treated.  Has had persistent sob with exertion even though symptoms have cleared.  Eating and drinking well.  Some persistent right side upper abdominal discomfort.  Has been present for a while.  Aggravated by certain movements.  Has discussed with her oncologist.  No fever.  No bowel change.  Having persistent problems with her right knee.  Request referral to Dr Leanor Kail.     Past Medical History:  Diagnosis Date  . Anemia    iron deficiency and B12 deficiency  . Bronchitis 08/2015  . Chronic back pain    followed by Dr Primus Bravo  . Depression   . Esophageal stricture    requiring dilatation x 2  . Fibromyalgia   . Fibromyalgia   . Follicular lymphoma ([place])    Followed by Dr Leretha Pol, s/p chemo and XRT  . Follicular lymphoma ([place])   . GERD (gastroesophageal reflux disease)   . Hiatal hernia   . Hypercholesterolemia   . IBS (irritable bowel syndrome)   . IBS (irritable bowel syndrome)   . Nephrolithiasis    followed by Dr Bernardo Heater, s/p stents  . Shingles outbreak 11/26/2011   Past Surgical History:  Procedure Laterality Date  . APPENDECTOMY    . BLADDER SURGERY  1993  . BREAST BIOPSY N/A   . CHOLECYSTECTOMY  1995  . LEFT OOPHORECTOMY    . LEG SURGERY Left   . ORIF ANKLE FRACTURE  2012  . OVARIAN CYST REMOVAL     right  . RECTOCELE REPAIR N/A   . TONSILECTOMY/ADENOIDECTOMY WITH MYRINGOTOMY  1970  . TONSILLECTOMY N/A   . TUBAL LIGATION  1976  . VAGINAL HYSTERECTOMY  1980's   secondary to bleeding   Family History  Problem Relation Age of Onset  . Ulcers Mother   . Ulcers Father   . Breast cancer      great aunt and grandfather  . Lung cancer       uncle  . Hypertension Maternal Grandmother   . Heart disease Maternal Grandmother     MI 21s  . Diabetes Maternal Grandmother   . Arthritis Maternal Grandmother   . Diabetes Maternal Grandfather    Social History   Social History  . Marital status: Married    Spouse name: N/A  . Number of children: N/A  . Years of education: N/A   Social History Main Topics  . Smoking status: Never Smoker  . Smokeless tobacco: Never Used  . Alcohol use No  . Drug use: No  . Sexual activity: Yes   Other Topics Concern  . None   Social History Narrative  . None    Outpatient Encounter Prescriptions as of 10/06/2015  Medication Sig  . aspirin EC 81 MG tablet Take by mouth.  Marland Kitchen azelastine (ASTELIN) 0.1 % nasal spray Place 1 spray into both nostrils 2 (two) times daily. Use in each nostril as directed  . calcium carbonate (TUMS - DOSED IN MG ELEMENTAL CALCIUM) 500 MG chewable tablet Chew 1 tablet by mouth as needed.  . cetirizine (ZYRTEC) 10 MG tablet Take by mouth.  Marland Kitchen  conjugated estrogens (PREMARIN) vaginal cream As directed.  . cyanocobalamin 1000 MCG tablet Take 1,000 mcg by mouth daily.  . Diphenhyd-Hydrocort-Nystatin (FIRST-DUKES MOUTHWASH) SUSP Swish and spit 5 mLs every 6 (six) hours as needed for Pain.  Marland Kitchen erythromycin ophthalmic ointment Place 1 application into the left eye 3 (three) times daily.  Marland Kitchen escitalopram (LEXAPRO) 20 MG tablet TAKE ONE TABLET EVERY DAY  . Fe Fum-FePoly-Vit C-Vit B3 (INTEGRA) 62.5-62.5-40-3 MG CAPS Take 1 capsule by mouth daily.  . fentaNYL (DURAGESIC) 100 MCG/HR Apply 1 patch to skin every 2 days with fentanyl patch 25 g per hour if tolerated  . fentaNYL (DURAGESIC) 25 MCG/HR patch Apply 1 patch to skin every 2 days with fentanyl patch 100 g per hour if tolerated  . fluticasone (FLONASE) 50 MCG/ACT nasal spray Place 2 sprays into both nostrils daily.  . furosemide (LASIX) 20 MG tablet Take 1 tablet (20 mg total) by mouth daily as needed.  . Omega-3 Fatty  Acids (FISH OIL) 1360 MG CAPS Take 1 capsule by mouth daily.  Marland Kitchen omeprazole (PRILOSEC) 20 MG capsule Take 1 capsule (20 mg total) by mouth 2 (two) times daily.  Marland Kitchen oxyCODONE (ROXICODONE) 5 MG immediate release tablet Limit 1 tab by mouth per day or twice per day if tolerated for breakthrough pain while wearing fentanyl patches  . senna-docusate (SENOKOT-S) 8.6-50 MG per tablet Take by mouth.  . valACYclovir (VALTREX) 500 MG tablet Take 1 tablet (500 mg total) by mouth daily.  . rosuvastatin (CRESTOR) 5 MG tablet Take 1 tablet (5 mg total) by mouth daily.   No facility-administered encounter medications on file as of 10/06/2015.     Review of Systems  Constitutional: Negative for appetite change and unexpected weight change.       Is trying to watch her diet.    HENT: Negative for congestion and sinus pressure.   Respiratory: Positive for shortness of breath. Negative for cough and chest tightness.   Cardiovascular: Negative for chest pain, palpitations and leg swelling.  Gastrointestinal: Negative for abdominal pain, diarrhea, nausea and vomiting.  Genitourinary: Negative for difficulty urinating and dysuria.  Musculoskeletal:       Chronic pain.  Followed by pain clinic.    Skin: Negative for color change and rash.  Neurological: Negative for dizziness, light-headedness and headaches.  Psychiatric/Behavioral: Negative for agitation and dysphoric mood.       Objective:    Physical Exam  Constitutional: She appears well-developed and well-nourished. No distress.  HENT:  Nose: Nose normal.  Mouth/Throat: Oropharynx is clear and moist.  Neck: Neck supple. No thyromegaly present.  Cardiovascular: Normal rate and regular rhythm.   Pulmonary/Chest: Breath sounds normal. No respiratory distress. She has no wheezes.  Abdominal: Soft. Bowel sounds are normal. There is no tenderness.  No pain to palpation over the right upper quadrant.   Musculoskeletal: She exhibits no edema or tenderness.    Lymphadenopathy:    She has no cervical adenopathy.  Skin: No rash noted. No erythema.  Psychiatric: She has a normal mood and affect. Her behavior is normal.    BP 104/60 (BP Location: Left Arm, Patient Position: Sitting, Cuff Size: Normal)   Pulse 80   Temp 98.8 F (37.1 C) (Oral)   Resp 16   Wt 179 lb (81.2 kg)   BMI 27.62 kg/m  Wt Readings from Last 3 Encounters:  10/06/15 179 lb (81.2 kg)  09/13/15 175 lb (79.4 kg)  08/24/15 182 lb 12.8 oz (82.9 kg)  Lab Results  Component Value Date   WBC 8.6 10/03/2015   HGB 12.0 10/03/2015   HCT 36.0 10/03/2015   PLT 205.0 10/03/2015   GLUCOSE 99 10/03/2015   CHOL 258 (H) 10/03/2015   TRIG 141.0 10/03/2015   HDL 49.40 10/03/2015   LDLDIRECT 164.2 02/11/2013   LDLCALC 181 (H) 10/03/2015   ALT 21 10/03/2015   AST 26 10/03/2015   NA 142 10/03/2015   K 4.0 10/03/2015   CL 101 10/03/2015   CREATININE 0.76 10/03/2015   BUN 10 10/03/2015   CO2 36 (H) 10/03/2015   TSH 1.55 10/03/2015   HGBA1C 5.7 10/27/2014    US Abdomen Complete  Result Date: 05/13/2015 CLINICAL DATA:  65 year old female with abnormal liver function tests. Prior cholecystectomy. Initial encounter. EXAM: ABDOMEN ULTRASOUND COMPLETE COMPARISON:  CT Abdomen and Pelvis 03/17/2013. FINDINGS: Gallbladder: Surgically absent. Common bile duct: Diameter: 7 mm, within normal limits in the post cholecystectomy state. Liver: Unchanged small anterior right lobe simple appearing cyst measuring 14 mm. Similar small simple appearing left lobe cyst, 9 mm. No intrahepatic biliary ductal dilatation. No other discrete liver lesion. Abnormally increased background liver echogenicity (image 43 and image 68). IVC: No abnormality visualized. Pancreas: Incompletely visualized due to overlying bowel gas, visualized portions within normal limits. Spleen: Size and appearance within normal limits. Right Kidney: Length: 11.1 cm. Echogenicity within normal limits. No mass or hydronephrosis  visualized. Left Kidney: Length: 10.1 cm. Stable small 11 mm exophytic benign midpole cyst. Echogenicity within normal limits. No mass or hydronephrosis visualized. Abdominal aorta: No aneurysm visualized. Other findings: None. IMPRESSION: 1. Fatty liver disease. 2. Otherwise negative abdomen ultrasound. Electronically Signed   By: Genevie Ann M.D.   On: 05/13/2015 10:35       Assessment & Plan:   Problem List Items Addressed This Visit    Anemia    Follow cbc.       Chronic back pain    Followed by pain clinic.        Fibromyalgia    Stable.  Chronic pain.  Followed by pain clinic.       GERD (gastroesophageal reflux disease)    Controlled on omeprazole.        Hypercholesterolemia    Discussed starting crestor.  She is in agreement.  Follow for problems.  Follow lipid panel and liver function tests.        Relevant Medications   rosuvastatin (CRESTOR) 5 MG tablet   Other Relevant Orders   Hepatic function panel   Lymphoma ([place])    Followed by Dr Leretha Pol.  Recommended f/u in one year.  Last seen 02/2015.       Overweight (BMI 25.0-29.9)    Diet and exercise.  Follow.        SOB (shortness of breath)    Has sob with exertion.  EKG revealed SR with no acute ischemic changes.  Given persistent symptoms despite resolution of cough and congestion, will have cardiology evaluate for question of need for further cardiac testing.         Other Visit Diagnoses    SOB (shortness of breath) on exertion    -  Primary   Relevant Orders   EKG 12-Lead (Completed)   DG Chest 2 View (Completed)   Ambulatory referral to Cardiology   Right knee pain       persistent right knee pain.  request referral back to ortho (Dr Leanor Kail).     Relevant Orders   Ambulatory referral  to Orthopedic Surgery       Einar Pheasant, MD

## 2015-10-06 NOTE — Telephone Encounter (Signed)
Pt advised. Maria Keller, CMA  

## 2015-10-09 ENCOUNTER — Encounter: Payer: Self-pay | Admitting: Internal Medicine

## 2015-10-09 NOTE — Assessment & Plan Note (Signed)
Diet and exercise.  Follow.  

## 2015-10-09 NOTE — Assessment & Plan Note (Signed)
Has sob with exertion.  EKG revealed SR with no acute ischemic changes.  Given persistent symptoms despite resolution of cough and congestion, will have cardiology evaluate for question of need for further cardiac testing.

## 2015-10-09 NOTE — Assessment & Plan Note (Signed)
Controlled on omeprazole.   

## 2015-10-09 NOTE — Assessment & Plan Note (Signed)
Followed by pain clinic.  

## 2015-10-09 NOTE — Assessment & Plan Note (Signed)
Discussed starting crestor.  She is in agreement.  Follow for problems.  Follow lipid panel and liver function tests.

## 2015-10-09 NOTE — Assessment & Plan Note (Signed)
Followed by Dr Leretha Pol.  Recommended f/u in one year.  Last seen 02/2015.

## 2015-10-09 NOTE — Assessment & Plan Note (Signed)
Stable.  Chronic pain.  Followed by pain clinic.  

## 2015-10-09 NOTE — Assessment & Plan Note (Signed)
Follow cbc.  

## 2015-10-12 ENCOUNTER — Encounter: Payer: Self-pay | Admitting: Pain Medicine

## 2015-10-12 ENCOUNTER — Ambulatory Visit: Payer: Medicare Other | Attending: Pain Medicine | Admitting: Pain Medicine

## 2015-10-12 VITALS — BP 107/60 | HR 75 | Temp 98.1°F | Resp 16 | Ht 67.0 in | Wt 175.0 lb

## 2015-10-12 DIAGNOSIS — M5116 Intervertebral disc disorders with radiculopathy, lumbar region: Secondary | ICD-10-CM | POA: Insufficient documentation

## 2015-10-12 DIAGNOSIS — M7060 Trochanteric bursitis, unspecified hip: Secondary | ICD-10-CM | POA: Insufficient documentation

## 2015-10-12 DIAGNOSIS — G8929 Other chronic pain: Secondary | ICD-10-CM

## 2015-10-12 DIAGNOSIS — M461 Sacroiliitis, not elsewhere classified: Secondary | ICD-10-CM | POA: Diagnosis not present

## 2015-10-12 DIAGNOSIS — M5416 Radiculopathy, lumbar region: Secondary | ICD-10-CM | POA: Diagnosis not present

## 2015-10-12 DIAGNOSIS — M47816 Spondylosis without myelopathy or radiculopathy, lumbar region: Secondary | ICD-10-CM

## 2015-10-12 DIAGNOSIS — M546 Pain in thoracic spine: Secondary | ICD-10-CM | POA: Diagnosis present

## 2015-10-12 DIAGNOSIS — B0229 Other postherpetic nervous system involvement: Secondary | ICD-10-CM

## 2015-10-12 DIAGNOSIS — M542 Cervicalgia: Secondary | ICD-10-CM | POA: Diagnosis present

## 2015-10-12 DIAGNOSIS — M791 Myalgia: Secondary | ICD-10-CM | POA: Diagnosis not present

## 2015-10-12 DIAGNOSIS — M47896 Other spondylosis, lumbar region: Secondary | ICD-10-CM | POA: Insufficient documentation

## 2015-10-12 DIAGNOSIS — M533 Sacrococcygeal disorders, not elsewhere classified: Secondary | ICD-10-CM | POA: Diagnosis not present

## 2015-10-12 DIAGNOSIS — M5136 Other intervertebral disc degeneration, lumbar region: Secondary | ICD-10-CM

## 2015-10-12 DIAGNOSIS — M47817 Spondylosis without myelopathy or radiculopathy, lumbosacral region: Secondary | ICD-10-CM | POA: Diagnosis not present

## 2015-10-12 MED ORDER — FENTANYL 100 MCG/HR TD PT72: MEDICATED_PATCH | TRANSDERMAL | 0 refills | Status: DC

## 2015-10-12 MED ORDER — OXYCODONE HCL 5 MG PO TABS: ORAL_TABLET | ORAL | 0 refills | Status: DC

## 2015-10-12 MED ORDER — FENTANYL 25 MCG/HR TD PT72: MEDICATED_PATCH | TRANSDERMAL | 0 refills | Status: DC

## 2015-10-12 NOTE — Progress Notes (Signed)
       The patient is a 65 year old female who returns to pain management for further evaluation and treatment of pain involving the neck entire back upper and lower extremity regions. The patient states pain is fairly well-controlled at this time medications consist of Duragesic patch oxycodone and Zanaflex. The patient denies trauma change in events of daily living the call significant change in symptomatology. Patient is with pain cervical thoracic and lumbar upper extremity regions without pain interfering with activities of daily living to severe degree. We have discussed patient's condition and will continue presently prescribed medications and we will remain available to consider modification of treatment pending response to treatment and follow-up evaluation. Patient will follow-up with Dr. Leretha Pol of [facility] for further assessment and treatment for lymphoma as planned as well. All agreed to suggested treatment plan.     Physical examination  There was tenderness to palpation of the paraspinal muscular treat the cervical region cervical facet region palpation which reproduces moderate discomfort. There was moderate tenderness of the acromioclavicular and glenohumeral joint region. Palpation over the region of the cervical and thoracic regions reproduced pain of moderate degree. Tinel and Phalen's maneuver were without increased pain of significant degree. There was no crepitus of the thoracic region noted. Palpation over the lumbar paraspinal musculatures and lumbar facet region was with moderate tenderness to palpation with lateral bending rotation extension and palpation lumbar facets reproducing moderate discomfort. There was minimal increased pain with pressure applied to the ileum with patient in lateral decubitus position. Palpation of the PSIS and PII S region reproduced mild to moderate discomfort. Straight leg raising was tolerates approximately 30 without  increased pain with dorsiflexion noted. There was negative clonus negative Homans. No sensory deficit or dermatomal distribution detected. There was negative clonus negative Homans. Abdomen nontender with no contrast costovertebral tenderness noted      Assessment    Degenerative disc disease lumbar spine L4-L5 level degenerative changes most significant with multilevel degenerative changes noted throughout the lumbar spine  Lumbar radiculopathy  Postherpetic neuralgia thoracic region (right side)  Sacroiliac joint dysfunction  Greater trochanteric bursitis      PLAN   Continue present medication fentanyl patches, Zanaflex, and oxycodone  F/U PCP Dr. Einar Pheasant for evaliation of  BP and general medical condition.   Appointment Dr. Josefa Half as scheduled  F/U surgical evaluation.  We will avoid at this time   F/U neurological evaluation. May consider pending follow-up evaluations We will avoid PNCV/EMG studies and other studies at this time  F/U  [facility] as planned and as discussed. Appointment as planned and as scheduled  F/U [facility] appointment as needed and as discussed today  May consider radiofrequency rhizolysis or intraspinal procedures pending response to present treatment and F/U evaluation. We will avoid such treatment at this time   Please call pain management for any concerns regarding your condition prior to scheduled return appointment

## 2015-10-12 NOTE — Patient Instructions (Signed)
PLAN   Continue present medication fentanyl patches, Zanaflex, and oxycodone  F/U PCP Dr. Einar Pheasant for evaliation of  BP and general medical condition.   Appointment Dr. Josefa Half as scheduled  F/U surgical evaluation.  We will avoid at this time   F/U neurological evaluation. May consider pending follow-up evaluations We will avoid PNCV/EMG studies and other studies at this time  F/U  [facility] as planned and as discussed. Appointment as planned and as scheduled  F/U [facility] appointment as needed and as discussed today  May consider radiofrequency rhizolysis or intraspinal procedures pending response to present treatment and F/U evaluation. We will avoid such treatment at this time   Please call pain management for any concerns regarding your condition prior to scheduled return appointment

## 2015-10-12 NOTE — Progress Notes (Signed)
Safety precautions to be maintained throughout the outpatient stay will include: orient to surroundings, keep bed in low position, maintain call bell within reach at all times, provide assistance with transfer out of bed and ambulation.  

## 2015-10-13 ENCOUNTER — Ambulatory Visit: Payer: Medicare Other | Admitting: Pain Medicine

## 2015-10-14 DIAGNOSIS — J208 Acute bronchitis due to other specified organisms: Secondary | ICD-10-CM | POA: Diagnosis not present

## 2015-10-14 DIAGNOSIS — E7800 Pure hypercholesterolemia, unspecified: Secondary | ICD-10-CM | POA: Diagnosis not present

## 2015-10-14 DIAGNOSIS — R0602 Shortness of breath: Secondary | ICD-10-CM | POA: Diagnosis not present

## 2015-10-18 DIAGNOSIS — M25561 Pain in right knee: Secondary | ICD-10-CM | POA: Diagnosis not present

## 2015-10-18 DIAGNOSIS — G8929 Other chronic pain: Secondary | ICD-10-CM | POA: Diagnosis not present

## 2015-10-24 DIAGNOSIS — M1711 Unilateral primary osteoarthritis, right knee: Secondary | ICD-10-CM | POA: Insufficient documentation

## 2015-10-27 DIAGNOSIS — L400 Psoriasis vulgaris: Secondary | ICD-10-CM | POA: Diagnosis not present

## 2015-10-27 DIAGNOSIS — L821 Other seborrheic keratosis: Secondary | ICD-10-CM | POA: Diagnosis not present

## 2015-10-27 DIAGNOSIS — L820 Inflamed seborrheic keratosis: Secondary | ICD-10-CM | POA: Diagnosis not present

## 2015-10-27 DIAGNOSIS — E881 Lipodystrophy, not elsewhere classified: Secondary | ICD-10-CM | POA: Diagnosis not present

## 2015-10-28 ENCOUNTER — Other Ambulatory Visit: Payer: Self-pay | Admitting: Internal Medicine

## 2015-11-14 DIAGNOSIS — M5416 Radiculopathy, lumbar region: Secondary | ICD-10-CM | POA: Diagnosis not present

## 2015-11-14 DIAGNOSIS — M461 Sacroiliitis, not elsewhere classified: Secondary | ICD-10-CM | POA: Diagnosis not present

## 2015-11-14 DIAGNOSIS — M47817 Spondylosis without myelopathy or radiculopathy, lumbosacral region: Secondary | ICD-10-CM | POA: Diagnosis not present

## 2015-11-14 DIAGNOSIS — M791 Myalgia: Secondary | ICD-10-CM | POA: Diagnosis not present

## 2015-11-15 ENCOUNTER — Other Ambulatory Visit: Payer: Self-pay | Admitting: Unknown Physician Specialty

## 2015-11-15 DIAGNOSIS — M1711 Unilateral primary osteoarthritis, right knee: Secondary | ICD-10-CM

## 2015-11-17 ENCOUNTER — Other Ambulatory Visit (INDEPENDENT_AMBULATORY_CARE_PROVIDER_SITE_OTHER): Payer: Medicare Other

## 2015-11-17 ENCOUNTER — Ambulatory Visit (INDEPENDENT_AMBULATORY_CARE_PROVIDER_SITE_OTHER): Payer: Medicare Other

## 2015-11-17 DIAGNOSIS — E7800 Pure hypercholesterolemia, unspecified: Secondary | ICD-10-CM | POA: Diagnosis not present

## 2015-11-17 DIAGNOSIS — Z23 Encounter for immunization: Secondary | ICD-10-CM

## 2015-11-17 LAB — HEPATIC FUNCTION PANEL
ALK PHOS: 95 U/L (ref 39–117)
ALT: 19 U/L (ref 0–35)
AST: 24 U/L (ref 0–37)
Albumin: 4.2 g/dL (ref 3.5–5.2)
BILIRUBIN TOTAL: 0.5 mg/dL (ref 0.2–1.2)
Bilirubin, Direct: 0.1 mg/dL (ref 0.0–0.3)
Total Protein: 6.9 g/dL (ref 6.0–8.3)

## 2015-11-17 NOTE — Progress Notes (Signed)
Patient received flu shot 

## 2015-11-28 ENCOUNTER — Telehealth: Payer: Self-pay | Admitting: Internal Medicine

## 2015-11-28 ENCOUNTER — Other Ambulatory Visit: Payer: Self-pay | Admitting: Internal Medicine

## 2015-11-28 MED ORDER — AZELASTINE HCL 0.1 % NA SOLN: 1.0000 | Freq: Two times a day (BID) | NASAL | 1 refills | Status: DC

## 2015-11-28 MED ORDER — FIRST-DUKES MOUTHWASH MT SUSP: OROMUCOSAL | 0 refills | Status: DC

## 2015-11-28 MED ORDER — FUROSEMIDE 20 MG PO TABS: 20.0000 mg | ORAL_TABLET | Freq: Every day | ORAL | 1 refills | Status: DC | PRN

## 2015-11-28 NOTE — Telephone Encounter (Signed)
Pt called and is requesting a refill on furosemide (LASIX) 20 MG tablet, Diphenhyd-Hydrocort-Nystatin (FIRST-DUKES MOUTHWASH) SUSP, and azelastine (ASTELIN) 0.1 % nasal [place], [place]  Call pt @ [phone]

## 2015-11-28 NOTE — Telephone Encounter (Signed)
Ok to fill 

## 2015-11-28 NOTE — Telephone Encounter (Signed)
rx's sent in for these medications.

## 2015-11-30 ENCOUNTER — Ambulatory Visit
Admission: RE | Admit: 2015-11-30 | Discharge: 2015-11-30 | Disposition: A | Discharge status: Home / Self Care | Payer: Medicare Other | Source: Ambulatory Visit | Attending: Unknown Physician Specialty | Admitting: Unknown Physician Specialty

## 2015-11-30 DIAGNOSIS — R937 Abnormal findings on diagnostic imaging of other parts of musculoskeletal system: Secondary | ICD-10-CM | POA: Insufficient documentation

## 2015-11-30 DIAGNOSIS — M25561 Pain in right knee: Secondary | ICD-10-CM | POA: Diagnosis not present

## 2015-11-30 DIAGNOSIS — M1711 Unilateral primary osteoarthritis, right knee: Secondary | ICD-10-CM | POA: Diagnosis not present

## 2015-12-06 DIAGNOSIS — M1711 Unilateral primary osteoarthritis, right knee: Secondary | ICD-10-CM | POA: Diagnosis not present

## 2015-12-08 DIAGNOSIS — L908 Other atrophic disorders of skin: Secondary | ICD-10-CM | POA: Diagnosis not present

## 2015-12-08 DIAGNOSIS — L400 Psoriasis vulgaris: Secondary | ICD-10-CM | POA: Diagnosis not present

## 2015-12-08 DIAGNOSIS — L820 Inflamed seborrheic keratosis: Secondary | ICD-10-CM | POA: Diagnosis not present

## 2016-01-03 DIAGNOSIS — H47032 Optic nerve hypoplasia, left eye: Secondary | ICD-10-CM | POA: Diagnosis not present

## 2016-01-03 DIAGNOSIS — H35371 Puckering of macula, right eye: Secondary | ICD-10-CM | POA: Diagnosis not present

## 2016-01-03 DIAGNOSIS — H43813 Vitreous degeneration, bilateral: Secondary | ICD-10-CM | POA: Diagnosis not present

## 2016-01-05 ENCOUNTER — Encounter: Payer: Self-pay | Admitting: Internal Medicine

## 2016-01-05 ENCOUNTER — Ambulatory Visit (INDEPENDENT_AMBULATORY_CARE_PROVIDER_SITE_OTHER): Payer: Medicare Other | Admitting: Internal Medicine

## 2016-01-05 DIAGNOSIS — C8580 Other specified types of non-Hodgkin lymphoma, unspecified site: Secondary | ICD-10-CM

## 2016-01-05 DIAGNOSIS — K589 Irritable bowel syndrome without diarrhea: Secondary | ICD-10-CM

## 2016-01-05 DIAGNOSIS — M797 Fibromyalgia: Secondary | ICD-10-CM

## 2016-01-05 DIAGNOSIS — E7800 Pure hypercholesterolemia, unspecified: Secondary | ICD-10-CM

## 2016-01-05 DIAGNOSIS — K219 Gastro-esophageal reflux disease without esophagitis: Secondary | ICD-10-CM

## 2016-01-05 DIAGNOSIS — M545 Low back pain: Secondary | ICD-10-CM | POA: Diagnosis not present

## 2016-01-05 DIAGNOSIS — M79605 Pain in left leg: Secondary | ICD-10-CM

## 2016-01-05 DIAGNOSIS — E663 Overweight: Secondary | ICD-10-CM

## 2016-01-05 NOTE — Progress Notes (Signed)
Pre visit review using our clinic review tool, if applicable. No additional management support is needed unless otherwise documented below in the visit note. 

## 2016-01-05 NOTE — Progress Notes (Signed)
Patient ID: Maria Keller, female   DOB: 1950-08-04, 65 y.o.   MRN: [id number]   Subjective:    Patient ID: Maria Keller, female    DOB: October 08, 1950, 65 y.o.   MRN: [id number]  HPI  Patient here for a scheduled follow up.  States she is doing relatively well.  Handling stress.  No chest pain.  No sob.  No acid reflux.  Occasionally will still notice some upper quadrant pain.  Just flares at times.  Has discussed with Dr Leretha Pol.  No other abdominal pain.  No nausea or vomiting.  Bowels stable.  Tolerating the crestor.  Discussed diet and exercise.     Past Medical History:  Diagnosis Date  . Anemia    iron deficiency and B12 deficiency  . Bronchitis 08/2015  . Chronic back pain    followed by Dr Primus Bravo  . Depression   . Esophageal stricture    requiring dilatation x 2  . Fibromyalgia   . Fibromyalgia   . Follicular lymphoma ([place])    Followed by Dr Leretha Pol, s/p chemo and XRT  . Follicular lymphoma ([place])   . GERD (gastroesophageal reflux disease)   . Hiatal hernia   . Hypercholesterolemia   . IBS (irritable bowel syndrome)   . IBS (irritable bowel syndrome)   . Nephrolithiasis    followed by Dr Bernardo Heater, s/p stents  . Shingles outbreak 11/26/2011   Past Surgical History:  Procedure Laterality Date  . APPENDECTOMY    . BLADDER SURGERY  1993  . BREAST BIOPSY N/A   . CHOLECYSTECTOMY  1995  . LEFT OOPHORECTOMY    . LEG SURGERY Left   . ORIF ANKLE FRACTURE  2012  . OVARIAN CYST REMOVAL     right  . RECTOCELE REPAIR N/A   . TONSILECTOMY/ADENOIDECTOMY WITH MYRINGOTOMY  1970  . TONSILLECTOMY N/A   . TUBAL LIGATION  1976  . VAGINAL HYSTERECTOMY  1980's   secondary to bleeding   Family History  Problem Relation Age of Onset  . Ulcers Mother   . Ulcers Father   . Breast cancer      great aunt and grandfather  . Lung cancer      uncle  . Hypertension Maternal Grandmother   . Heart disease Maternal Grandmother     MI 61s  . Diabetes Maternal Grandmother   . Arthritis  Maternal Grandmother   . Diabetes Maternal Grandfather    Social History   Social History  . Marital status: Married    Spouse name: N/A  . Number of children: N/A  . Years of education: N/A   Social History Main Topics  . Smoking status: Never Smoker  . Smokeless tobacco: Never Used  . Alcohol use No  . Drug use: No  . Sexual activity: Yes   Other Topics Concern  . None   Social History Narrative  . None    Outpatient Encounter Prescriptions as of 01/05/2016  Medication Sig  . aspirin EC 81 MG tablet Take by mouth.  Marland Kitchen azelastine (ASTELIN) 0.1 % nasal spray Place 1 spray into both nostrils 2 (two) times daily. Use in each nostril as directed  . calcium carbonate (TUMS - DOSED IN MG ELEMENTAL CALCIUM) 500 MG chewable tablet Chew 1 tablet by mouth as needed.  . cetirizine (ZYRTEC) 10 MG tablet Take by mouth.  . conjugated estrogens (PREMARIN) vaginal cream As directed.  . cyanocobalamin 1000 MCG tablet Take 1,000 mcg by mouth daily.  . Diphenhyd-Hydrocort-Nystatin (FIRST-DUKES MOUTHWASH)  SUSP Swish and spit 5 mLs every 6 (six) hours as needed for Pain.  Marland Kitchen escitalopram (LEXAPRO) 20 MG tablet TAKE 1 TABLET BY MOUTH DAILY  . Fe Fum-FePoly-Vit C-Vit B3 (INTEGRA) 62.5-62.5-40-3 MG CAPS Take 1 capsule by mouth daily.  . fentaNYL (DURAGESIC) 100 MCG/HR Apply 1 patch to skin every 2 days with fentanyl patch 25 g per hour if tolerated  . fentaNYL (DURAGESIC) 25 MCG/HR patch Apply 1 patch to skin every 2 days with fentanyl patch 100 g per hour if tolerated  . fluticasone (FLONASE) 50 MCG/ACT nasal spray Place 2 sprays into both nostrils daily.  . furosemide (LASIX) 20 MG tablet Take 1 tablet (20 mg total) by mouth daily as needed.  . Omega-3 Fatty Acids (FISH OIL) 1360 MG CAPS Take 1 capsule by mouth daily.  Marland Kitchen omeprazole (PRILOSEC) 20 MG capsule Take 1 capsule (20 mg total) by mouth 2 (two) times daily.  Marland Kitchen oxyCODONE (ROXICODONE) 5 MG immediate release tablet Limit 1 tab by mouth per  day or twice per day if tolerated for breakthrough pain while wearing fentanyl patches  . rosuvastatin (CRESTOR) 5 MG tablet TAKE ONE TABLET BY MOUTH EVERY DAY  . senna-docusate (SENOKOT-S) 8.6-50 MG per tablet Take by mouth.  . valACYclovir (VALTREX) 500 MG tablet Take 1 tablet (500 mg total) by mouth daily.  . [DISCONTINUED] erythromycin ophthalmic ointment Place 1 application into the left eye 3 (three) times daily.   No facility-administered encounter medications on file as of 01/05/2016.     Review of Systems  Constitutional: Negative for appetite change and unexpected weight change.  HENT: Negative for congestion and sinus pressure.   Respiratory: Negative for cough, chest tightness and shortness of breath.   Cardiovascular: Negative for chest pain, palpitations and leg swelling.  Gastrointestinal: Negative for diarrhea, nausea and vomiting.  Genitourinary: Negative for difficulty urinating and dysuria.  Musculoskeletal: Positive for back pain. Negative for joint swelling.  Skin: Negative for color change and rash.  Neurological: Negative for dizziness, light-headedness and headaches.  Psychiatric/Behavioral: Negative for agitation and dysphoric mood.       Objective:    Physical Exam  Constitutional: She appears well-developed and well-nourished. No distress.  HENT:  Nose: Nose normal.  Mouth/Throat: Oropharynx is clear and moist.  Neck: Neck supple. No thyromegaly present.  Cardiovascular: Normal rate and regular rhythm.   Pulmonary/Chest: Breath sounds normal. No respiratory distress. She has no wheezes.  Abdominal: Soft. Bowel sounds are normal. There is no tenderness.  Musculoskeletal: She exhibits no edema or tenderness.  Lymphadenopathy:    She has no cervical adenopathy.  Skin: No rash noted. No erythema.  Psychiatric: She has a normal mood and affect. Her behavior is normal.    BP 110/72   Pulse 68   Temp 98.9 F (37.2 C) (Oral)   Ht 5\' 7"  (1.702 m)   Wt  185 lb 9.6 oz (84.2 kg)   SpO2 94%   BMI 29.07 kg/m  Wt Readings from Last 3 Encounters:  01/05/16 185 lb 9.6 oz (84.2 kg)  10/12/15 175 lb (79.4 kg)  10/06/15 179 lb (81.2 kg)     Lab Results  Component Value Date   WBC 8.6 10/03/2015   HGB 12.0 10/03/2015   HCT 36.0 10/03/2015   PLT 205.0 10/03/2015   GLUCOSE 99 10/03/2015   CHOL 258 (H) 10/03/2015   TRIG 141.0 10/03/2015   HDL 49.40 10/03/2015   LDLDIRECT 164.2 02/11/2013   LDLCALC 181 (H) 10/03/2015   ALT  19 11/17/2015   AST 24 11/17/2015   NA 142 10/03/2015   K 4.0 10/03/2015   CL 101 10/03/2015   CREATININE 0.76 10/03/2015   BUN 10 10/03/2015   CO2 36 (H) 10/03/2015   TSH 1.55 10/03/2015   HGBA1C 5.7 10/27/2014    Mr Knee Right Wo Contrast  Result Date: 11/30/2015 CLINICAL DATA:  Chronic anterior and posterior knee pain. History of meniscus repair and osteoarthritis. No recent injury. EXAM: MRI OF THE RIGHT KNEE WITHOUT CONTRAST TECHNIQUE: Multiplanar, multisequence MR imaging of the knee was performed. No intravenous contrast was administered. COMPARISON:  MRI 04/17/2013. FINDINGS: MENISCI Medial meniscus: Presumed interval partial meniscectomy. The posterior horn and body are mildly diminutive with mild free edge blunting. No recurrent meniscal tear or displaced fragment identified. Lateral meniscus: Presumed interval partial meniscectomy. There is progressive horizontal signal within the meniscal body, best seen on the coronal images. Possible tiny parameniscal cyst peripheral to the meniscal body, best seen on coronal image 14. No displaced meniscal fragment. LIGAMENTS Cruciates:  Intact. Collaterals:  Intact. CARTILAGE Patellofemoral: Stable patellofemoral chondral thinning and surface irregularity. Medial: Stable chondral thinning, surface irregularity and osteophytes. Lateral: Stable chondral thinning, surface irregularity and osteophytes. MISCELLANEOUS Joint: Small joint effusion. Mild synovial irregularity without  definite loose body. Popliteal Fossa:  Unremarkable. No significant Baker's cyst. Extensor Mechanism:  Intact. Bones:  No acute or significant extra-articular osseous findings. Other: No significant periarticular soft tissue findings. IMPRESSION: 1. Presumed interval partial meniscectomy medially and laterally. 2. Horizontal signal within the body of the lateral meniscus with possible adjacent tiny parameniscal cyst. These findings may indicate a small recurrent lateral meniscal tear. No displaced meniscal fragment. 3. Stable tricompartmental degenerative changes. No acute osseous findings. 4. The cruciate and collateral ligaments are intact. Electronically Signed   By: Richardean Sale M.D.   On: 11/30/2015 12:59       Assessment & Plan:   Problem List Items Addressed This Visit    Fibromyalgia    Stable.  Chronic pain.  Followed by pain clinic.        GERD (gastroesophageal reflux disease)    On omeprazole.  Upper symptoms controlled.        Hypercholesterolemia    Tolerating crestor.  Low cholesterol diet and exercise.  Follow lipid panel.        Relevant Orders   Lipid panel   Hepatic function panel   Basic metabolic panel   IBS (irritable bowel syndrome)    Bowels stable.        Low back pain radiating to both legs    Chronic pain.  Followed by pain clinic.        Lymphoma ([place])    Followed by Dr Leretha Pol.  Planning f/u in 02/2016.        Overweight (BMI 25.0-29.9)    Diet and exercise.  Follow.           Einar Pheasant, MD

## 2016-01-08 ENCOUNTER — Encounter: Payer: Self-pay | Admitting: Internal Medicine

## 2016-01-08 NOTE — Assessment & Plan Note (Signed)
Bowels stable.  

## 2016-01-08 NOTE — Assessment & Plan Note (Signed)
Tolerating crestor.  Low cholesterol diet and exercise.  Follow lipid panel.

## 2016-01-08 NOTE — Assessment & Plan Note (Signed)
Chronic pain.  Followed by pain clinic.   

## 2016-01-08 NOTE — Assessment & Plan Note (Signed)
Stable.  Chronic pain.  Followed by pain clinic.  

## 2016-01-08 NOTE — Assessment & Plan Note (Signed)
Diet and exercise.  Follow.  

## 2016-01-08 NOTE — Assessment & Plan Note (Signed)
Followed by Dr Leretha Pol.  Planning f/u in 02/2016.

## 2016-01-08 NOTE — Assessment & Plan Note (Signed)
On omeprazole.  Upper symptoms controlled.   

## 2016-01-09 DIAGNOSIS — M5416 Radiculopathy, lumbar region: Secondary | ICD-10-CM | POA: Diagnosis not present

## 2016-01-09 DIAGNOSIS — M461 Sacroiliitis, not elsewhere classified: Secondary | ICD-10-CM | POA: Diagnosis not present

## 2016-01-09 DIAGNOSIS — M47817 Spondylosis without myelopathy or radiculopathy, lumbosacral region: Secondary | ICD-10-CM | POA: Diagnosis not present

## 2016-01-09 DIAGNOSIS — M791 Myalgia: Secondary | ICD-10-CM | POA: Diagnosis not present

## 2016-01-16 DIAGNOSIS — R0602 Shortness of breath: Secondary | ICD-10-CM | POA: Diagnosis not present

## 2016-01-16 DIAGNOSIS — E7800 Pure hypercholesterolemia, unspecified: Secondary | ICD-10-CM | POA: Diagnosis not present

## 2016-01-18 ENCOUNTER — Other Ambulatory Visit (INDEPENDENT_AMBULATORY_CARE_PROVIDER_SITE_OTHER): Payer: Medicare Other

## 2016-01-18 DIAGNOSIS — E7800 Pure hypercholesterolemia, unspecified: Secondary | ICD-10-CM | POA: Diagnosis not present

## 2016-01-18 LAB — LIPID PANEL
CHOLESTEROL: 209 mg/dL — AB (ref 0–200)
HDL: 55.8 mg/dL (ref >39.00)
LDL CALC: 125 mg/dL — AB (ref 0–99)
NonHDL: 152.77
TRIGLYCERIDES: 141 mg/dL (ref 0.0–149.0)
Total CHOL/HDL Ratio: 4
VLDL: 28.2 mg/dL (ref 0.0–40.0)

## 2016-01-18 LAB — BASIC METABOLIC PANEL
BUN: 10 mg/dL (ref 6–23)
CO2: 37 mEq/L — ABNORMAL HIGH (ref 19–32)
CREATININE: 0.73 mg/dL (ref 0.40–1.20)
Calcium: 9.3 mg/dL (ref 8.4–10.5)
Chloride: 101 mEq/L (ref 96–112)
GFR: 84.48 mL/min (ref >60.00)
GLUCOSE: 92 mg/dL (ref 70–99)
Potassium: 4.2 mEq/L (ref 3.5–5.1)
Sodium: 143 mEq/L (ref 135–145)

## 2016-01-18 LAB — HEPATIC FUNCTION PANEL
ALK PHOS: 88 U/L (ref 39–117)
ALT: 20 U/L (ref 0–35)
AST: 25 U/L (ref 0–37)
Albumin: 4.3 g/dL (ref 3.5–5.2)
BILIRUBIN DIRECT: 0.1 mg/dL (ref 0.0–0.3)
BILIRUBIN TOTAL: 0.5 mg/dL (ref 0.2–1.2)
TOTAL PROTEIN: 6.9 g/dL (ref 6.0–8.3)

## 2016-01-25 DIAGNOSIS — M1711 Unilateral primary osteoarthritis, right knee: Secondary | ICD-10-CM | POA: Diagnosis not present

## 2016-01-28 ENCOUNTER — Other Ambulatory Visit: Payer: Self-pay | Admitting: Internal Medicine

## 2016-02-04 ENCOUNTER — Other Ambulatory Visit: Payer: Self-pay | Admitting: Internal Medicine

## 2016-02-07 DIAGNOSIS — M791 Myalgia: Secondary | ICD-10-CM | POA: Diagnosis not present

## 2016-02-07 DIAGNOSIS — M461 Sacroiliitis, not elsewhere classified: Secondary | ICD-10-CM | POA: Diagnosis not present

## 2016-02-07 DIAGNOSIS — M5416 Radiculopathy, lumbar region: Secondary | ICD-10-CM | POA: Diagnosis not present

## 2016-02-07 DIAGNOSIS — M47817 Spondylosis without myelopathy or radiculopathy, lumbosacral region: Secondary | ICD-10-CM | POA: Diagnosis not present

## 2016-02-28 ENCOUNTER — Other Ambulatory Visit: Payer: Self-pay | Admitting: Internal Medicine

## 2016-02-29 DIAGNOSIS — C8238 Follicular lymphoma grade IIIa, lymph nodes of multiple sites: Secondary | ICD-10-CM | POA: Diagnosis not present

## 2016-03-01 ENCOUNTER — Telehealth: Payer: Self-pay | Admitting: Internal Medicine

## 2016-03-01 NOTE — Telephone Encounter (Signed)
Pt called needing a copy of her EKG with in the last 6 month. Pt thinks she had one in September. Pt will be having a complete knee replacement on March 19, 2016. Pt needs it for this Monday. Please advise?  Call pt @ [phone] home or cell [phone]. Thank you!

## 2016-03-01 NOTE — Telephone Encounter (Signed)
Ok for pt to have copy of 10/06/15 EKG

## 2016-03-01 NOTE — Telephone Encounter (Signed)
EKG done 10/06/15 ok to release to patient ? Please advise?

## 2016-03-02 NOTE — Telephone Encounter (Signed)
EKG printed patient advised . Patient will pick up.

## 2016-03-06 DIAGNOSIS — M5416 Radiculopathy, lumbar region: Secondary | ICD-10-CM | POA: Diagnosis not present

## 2016-03-06 DIAGNOSIS — M791 Myalgia: Secondary | ICD-10-CM | POA: Diagnosis not present

## 2016-03-06 DIAGNOSIS — M47817 Spondylosis without myelopathy or radiculopathy, lumbosacral region: Secondary | ICD-10-CM | POA: Diagnosis not present

## 2016-03-06 DIAGNOSIS — M461 Sacroiliitis, not elsewhere classified: Secondary | ICD-10-CM | POA: Diagnosis not present

## 2016-03-19 DIAGNOSIS — G4733 Obstructive sleep apnea (adult) (pediatric): Secondary | ICD-10-CM | POA: Diagnosis present

## 2016-03-19 DIAGNOSIS — M797 Fibromyalgia: Secondary | ICD-10-CM | POA: Diagnosis present

## 2016-03-19 DIAGNOSIS — G8929 Other chronic pain: Secondary | ICD-10-CM | POA: Diagnosis present

## 2016-03-19 DIAGNOSIS — M1711 Unilateral primary osteoarthritis, right knee: Secondary | ICD-10-CM | POA: Diagnosis not present

## 2016-03-19 DIAGNOSIS — K219 Gastro-esophageal reflux disease without esophagitis: Secondary | ICD-10-CM | POA: Diagnosis present

## 2016-03-19 DIAGNOSIS — E785 Hyperlipidemia, unspecified: Secondary | ICD-10-CM | POA: Diagnosis present

## 2016-03-19 DIAGNOSIS — F418 Other specified anxiety disorders: Secondary | ICD-10-CM | POA: Diagnosis present

## 2016-03-19 DIAGNOSIS — M25561 Pain in right knee: Secondary | ICD-10-CM | POA: Diagnosis not present

## 2016-03-19 DIAGNOSIS — G8918 Other acute postprocedural pain: Secondary | ICD-10-CM | POA: Diagnosis not present

## 2016-03-19 DIAGNOSIS — F419 Anxiety disorder, unspecified: Secondary | ICD-10-CM | POA: Diagnosis not present

## 2016-03-19 HISTORY — PX: REPLACEMENT TOTAL KNEE: SUR1224

## 2016-03-23 DIAGNOSIS — G8929 Other chronic pain: Secondary | ICD-10-CM | POA: Diagnosis not present

## 2016-03-23 DIAGNOSIS — M25561 Pain in right knee: Secondary | ICD-10-CM | POA: Diagnosis not present

## 2016-03-24 DIAGNOSIS — C8290 Follicular lymphoma, unspecified, unspecified site: Secondary | ICD-10-CM | POA: Diagnosis not present

## 2016-03-24 DIAGNOSIS — Z96651 Presence of right artificial knee joint: Secondary | ICD-10-CM | POA: Diagnosis not present

## 2016-03-24 DIAGNOSIS — Z471 Aftercare following joint replacement surgery: Secondary | ICD-10-CM | POA: Diagnosis not present

## 2016-03-26 DIAGNOSIS — C8290 Follicular lymphoma, unspecified, unspecified site: Secondary | ICD-10-CM | POA: Diagnosis not present

## 2016-03-26 DIAGNOSIS — Z96651 Presence of right artificial knee joint: Secondary | ICD-10-CM | POA: Diagnosis not present

## 2016-03-26 DIAGNOSIS — Z471 Aftercare following joint replacement surgery: Secondary | ICD-10-CM | POA: Diagnosis not present

## 2016-03-28 DIAGNOSIS — Z96651 Presence of right artificial knee joint: Secondary | ICD-10-CM | POA: Diagnosis not present

## 2016-03-28 DIAGNOSIS — C8290 Follicular lymphoma, unspecified, unspecified site: Secondary | ICD-10-CM | POA: Diagnosis not present

## 2016-03-28 DIAGNOSIS — Z471 Aftercare following joint replacement surgery: Secondary | ICD-10-CM | POA: Diagnosis not present

## 2016-03-30 DIAGNOSIS — Z96651 Presence of right artificial knee joint: Secondary | ICD-10-CM | POA: Diagnosis not present

## 2016-03-30 DIAGNOSIS — Z471 Aftercare following joint replacement surgery: Secondary | ICD-10-CM | POA: Diagnosis not present

## 2016-03-30 DIAGNOSIS — C8290 Follicular lymphoma, unspecified, unspecified site: Secondary | ICD-10-CM | POA: Diagnosis not present

## 2016-03-31 ENCOUNTER — Other Ambulatory Visit: Payer: Self-pay | Admitting: Internal Medicine

## 2016-04-02 DIAGNOSIS — Z471 Aftercare following joint replacement surgery: Secondary | ICD-10-CM | POA: Diagnosis not present

## 2016-04-02 DIAGNOSIS — C8290 Follicular lymphoma, unspecified, unspecified site: Secondary | ICD-10-CM | POA: Diagnosis not present

## 2016-04-02 DIAGNOSIS — Z96651 Presence of right artificial knee joint: Secondary | ICD-10-CM | POA: Diagnosis not present

## 2016-04-04 DIAGNOSIS — C8290 Follicular lymphoma, unspecified, unspecified site: Secondary | ICD-10-CM | POA: Diagnosis not present

## 2016-04-04 DIAGNOSIS — Z96651 Presence of right artificial knee joint: Secondary | ICD-10-CM | POA: Diagnosis not present

## 2016-04-04 DIAGNOSIS — Z471 Aftercare following joint replacement surgery: Secondary | ICD-10-CM | POA: Diagnosis not present

## 2016-04-05 DIAGNOSIS — M47817 Spondylosis without myelopathy or radiculopathy, lumbosacral region: Secondary | ICD-10-CM | POA: Diagnosis not present

## 2016-04-05 DIAGNOSIS — G894 Chronic pain syndrome: Secondary | ICD-10-CM | POA: Diagnosis not present

## 2016-04-05 DIAGNOSIS — M791 Myalgia: Secondary | ICD-10-CM | POA: Diagnosis not present

## 2016-04-05 DIAGNOSIS — M5416 Radiculopathy, lumbar region: Secondary | ICD-10-CM | POA: Diagnosis not present

## 2016-04-05 DIAGNOSIS — M461 Sacroiliitis, not elsewhere classified: Secondary | ICD-10-CM | POA: Diagnosis not present

## 2016-04-05 DIAGNOSIS — Z79891 Long term (current) use of opiate analgesic: Secondary | ICD-10-CM | POA: Diagnosis not present

## 2016-04-06 DIAGNOSIS — C8290 Follicular lymphoma, unspecified, unspecified site: Secondary | ICD-10-CM | POA: Diagnosis not present

## 2016-04-06 DIAGNOSIS — Z471 Aftercare following joint replacement surgery: Secondary | ICD-10-CM | POA: Diagnosis not present

## 2016-04-06 DIAGNOSIS — Z96651 Presence of right artificial knee joint: Secondary | ICD-10-CM | POA: Diagnosis not present

## 2016-04-09 DIAGNOSIS — Z471 Aftercare following joint replacement surgery: Secondary | ICD-10-CM | POA: Diagnosis not present

## 2016-04-09 DIAGNOSIS — Z96651 Presence of right artificial knee joint: Secondary | ICD-10-CM | POA: Diagnosis not present

## 2016-04-09 DIAGNOSIS — C8290 Follicular lymphoma, unspecified, unspecified site: Secondary | ICD-10-CM | POA: Diagnosis not present

## 2016-04-10 ENCOUNTER — Encounter: Payer: Medicare Other | Admitting: Internal Medicine

## 2016-04-11 DIAGNOSIS — Z96651 Presence of right artificial knee joint: Secondary | ICD-10-CM | POA: Diagnosis not present

## 2016-04-11 DIAGNOSIS — C8290 Follicular lymphoma, unspecified, unspecified site: Secondary | ICD-10-CM | POA: Diagnosis not present

## 2016-04-11 DIAGNOSIS — Z471 Aftercare following joint replacement surgery: Secondary | ICD-10-CM | POA: Diagnosis not present

## 2016-04-13 DIAGNOSIS — C8290 Follicular lymphoma, unspecified, unspecified site: Secondary | ICD-10-CM | POA: Diagnosis not present

## 2016-04-13 DIAGNOSIS — Z96651 Presence of right artificial knee joint: Secondary | ICD-10-CM | POA: Diagnosis not present

## 2016-04-13 DIAGNOSIS — Z471 Aftercare following joint replacement surgery: Secondary | ICD-10-CM | POA: Diagnosis not present

## 2016-04-16 DIAGNOSIS — M25661 Stiffness of right knee, not elsewhere classified: Secondary | ICD-10-CM | POA: Diagnosis not present

## 2016-04-16 DIAGNOSIS — M6281 Muscle weakness (generalized): Secondary | ICD-10-CM | POA: Diagnosis not present

## 2016-04-16 DIAGNOSIS — M25561 Pain in right knee: Secondary | ICD-10-CM | POA: Diagnosis not present

## 2016-04-16 DIAGNOSIS — Z96651 Presence of right artificial knee joint: Secondary | ICD-10-CM | POA: Diagnosis not present

## 2016-04-18 DIAGNOSIS — Z96651 Presence of right artificial knee joint: Secondary | ICD-10-CM | POA: Diagnosis not present

## 2016-04-19 DIAGNOSIS — Z96651 Presence of right artificial knee joint: Secondary | ICD-10-CM | POA: Diagnosis not present

## 2016-04-20 DIAGNOSIS — M6281 Muscle weakness (generalized): Secondary | ICD-10-CM | POA: Diagnosis not present

## 2016-04-20 DIAGNOSIS — M25661 Stiffness of right knee, not elsewhere classified: Secondary | ICD-10-CM | POA: Diagnosis not present

## 2016-04-20 DIAGNOSIS — M25561 Pain in right knee: Secondary | ICD-10-CM | POA: Diagnosis not present

## 2016-04-20 DIAGNOSIS — Z96651 Presence of right artificial knee joint: Secondary | ICD-10-CM | POA: Diagnosis not present

## 2016-04-23 DIAGNOSIS — Z96651 Presence of right artificial knee joint: Secondary | ICD-10-CM | POA: Diagnosis not present

## 2016-04-25 DIAGNOSIS — Z96651 Presence of right artificial knee joint: Secondary | ICD-10-CM | POA: Diagnosis not present

## 2016-04-26 ENCOUNTER — Ambulatory Visit (INDEPENDENT_AMBULATORY_CARE_PROVIDER_SITE_OTHER): Payer: Medicare Other

## 2016-04-26 VITALS — BP 98/60 | HR 73 | Temp 98.4°F | Resp 12 | Ht 67.0 in | Wt 175.0 lb

## 2016-04-26 DIAGNOSIS — Z0000 Encounter for general adult medical examination without abnormal findings: Secondary | ICD-10-CM

## 2016-04-26 NOTE — Progress Notes (Signed)
Subjective:   Maria Keller is a 66 y.o. female who presents for Medicare Annual (Subsequent) preventive examination.  Review of Systems:  No ROS.  Medicare Wellness Visit.  Cardiac Risk Factors include: advanced age (>99men, >61 women)     Objective:     Vitals: BP 98/60 (BP Location: Right Arm, Patient Position: Sitting, Cuff Size: Normal)   Pulse 73   Temp 98.4 F (36.9 C) (Oral)   Resp 12   Ht 5\' 7"  (1.702 m)   Wt 175 lb (79.4 kg)   SpO2 96%   BMI 27.41 kg/m   Body mass index is 27.41 kg/m.   Tobacco History  Smoking Status  . Never Smoker  Smokeless Tobacco  . Never Used     Counseling given: Not Answered   Past Medical History:  Diagnosis Date  . Anemia    iron deficiency and B12 deficiency  . Bronchitis 08/2015  . Chronic back pain    followed by Dr Primus Bravo  . Depression   . Esophageal stricture    requiring dilatation x 2  . Fibromyalgia   . Fibromyalgia   . Follicular lymphoma (Maria Keller)    Followed by Dr Leretha Pol, s/p chemo and XRT  . Follicular lymphoma (Maria Keller)   . GERD (gastroesophageal reflux disease)   . Hiatal hernia   . Hypercholesterolemia   . IBS (irritable bowel syndrome)   . IBS (irritable bowel syndrome)   . Nephrolithiasis    followed by Dr Bernardo Heater, s/p stents  . Shingles outbreak 11/26/2011   Past Surgical History:  Procedure Laterality Date  . APPENDECTOMY    . BLADDER SURGERY  1993  . BREAST BIOPSY N/A   . CHOLECYSTECTOMY  1995  . LEFT OOPHORECTOMY    . LEG SURGERY Left   . ORIF ANKLE FRACTURE  2012  . OVARIAN CYST REMOVAL     right  . RECTOCELE REPAIR N/A   . REPLACEMENT TOTAL KNEE Right 03/19/2016  . TONSILECTOMY/ADENOIDECTOMY WITH MYRINGOTOMY  1970  . TONSILLECTOMY N/A   . TUBAL LIGATION  1976  . VAGINAL HYSTERECTOMY  1980's   secondary to bleeding   Family History  Problem Relation Age of Onset  . Ulcers Mother   . Ulcers Father   . Breast cancer      great aunt and grandfather  . Lung cancer      uncle  .  Hypertension Maternal Grandmother   . Heart disease Maternal Grandmother     MI 65s  . Diabetes Maternal Grandmother   . Arthritis Maternal Grandmother   . Diabetes Maternal Grandfather    History  Sexual Activity  . Sexual activity: Yes    Outpatient Encounter Prescriptions as of 04/26/2016  Medication Sig  . aspirin EC 81 MG tablet Take by mouth.  Marland Kitchen azelastine (ASTELIN) 0.1 % nasal spray ONER SPRAY IN EACH NOSTRIL TWICE DAILY AS DIRECTED  . calcium carbonate (TUMS - DOSED IN MG ELEMENTAL CALCIUM) 500 MG chewable tablet Chew 1 tablet by mouth as needed.  . cetirizine (ZYRTEC) 10 MG tablet Take by mouth.  . conjugated estrogens (PREMARIN) vaginal cream As directed.  . cyanocobalamin 1000 MCG tablet Take 1,000 mcg by mouth daily.  . Diphenhyd-Hydrocort-Nystatin (FIRST-DUKES MOUTHWASH) SUSP Swish and spit 5 mLs every 6 (six) hours as needed for Pain.  Marland Kitchen escitalopram (LEXAPRO) 20 MG tablet TAKE ONE TABLET EVERY DAY  . Fe Fum-FePoly-Vit C-Vit B3 (INTEGRA) 62.5-62.5-40-3 MG CAPS Take 1 capsule by mouth daily.  . fentaNYL (DURAGESIC) 100  MCG/HR Apply 1 patch to skin every 2 days with fentanyl patch 25 g per hour if tolerated  . fentaNYL (DURAGESIC) 25 MCG/HR patch Apply 1 patch to skin every 2 days with fentanyl patch 100 g per hour if tolerated  . fluticasone (FLONASE) 50 MCG/ACT nasal spray Place 2 sprays into both nostrils daily.  . furosemide (LASIX) 20 MG tablet TAKE ONE TABLET BY MOUTH EVERY DAY AS NEEDED  . Omega-3 Fatty Acids (FISH OIL) 1360 MG CAPS Take 1 capsule by mouth daily.  Marland Kitchen omeprazole (PRILOSEC) 20 MG capsule TAKE 1 CAPSULE TWICE DAILY  . oxyCODONE (ROXICODONE) 5 MG immediate release tablet Limit 1 tab by mouth per day or twice per day if tolerated for breakthrough pain while wearing fentanyl patches  . rosuvastatin (CRESTOR) 5 MG tablet TAKE ONE TABLET BY MOUTH EVERY DAY  . senna-docusate (SENOKOT-S) 8.6-50 MG per tablet Take by mouth.  . valACYclovir (VALTREX) 500 MG  tablet TAKE ONE TABLET EVERY DAY   No facility-administered encounter medications on file as of 04/26/2016.     Activities of Daily Living In your present state of health, do you have any difficulty performing the following activities: 04/26/2016 04/27/2015  Hearing? N N  Vision? N N  Difficulty concentrating or making decisions? N N  Walking or climbing stairs? Y N  Dressing or bathing? N N  Doing errands, shopping? N N  Preparing Food and eating ? N N  Using the Toilet? N N  In the past six months, have you accidently leaked urine? N N  Do you have problems with loss of bowel control? N Y  Managing your Medications? N N  Managing your Finances? N N  Housekeeping or managing your Housekeeping? N N  Some recent data might be hidden    Patient Care Team: Einar Pheasant, MD as PCP - General (Internal Medicine)    Assessment:    This is a routine wellness examination for [name]. The goal of the wellness visit is to assist the patient how to close the gaps in care and create a preventative care plan for the patient.   Osteoporosis risk reviewed.  Medications reviewed; taking without issues or barriers.  Safety issues reviewed; smoke detectors in the home. No firearms in the home.  Wears seatbelts when driving or riding with others. Patient does wear sunscreen or protective clothing when in direct sunlight. No violence in the home.  Patient is alert, normal appearance, oriented to person/place/and time. Correctly identified the president of the [place], recall of 2/3 words, and performing simple calculations.  Patient displays appropriate judgement and can read correct time from watch face.  No new identified risk were noted.  No failures at ADL's or IADL's. Walker in use through the recovery period of total R knee replacement.  BMI- discussed the importance of a healthy diet, water intake and exercise. Educational material provided.   Diet: Breakfast: Protein shake Lunch:  Soup, fruit Dinner: Dance movement psychotherapist, vegetable Fluid intake: 1 cup of caffeine (per week), 6-8cups of water daily  Dental- every six months. Dr. Jefm Bryant.                    Eye- Visual acuity not assessed per patient preference since they have regular follow up with the ophthalmologist.  Wears corrective lenses.  Sleep patterns- Sleeps 5-6 hours at night.  Wakes feeling rested.   TDAP and PNA 23 vaccine deferred per patient preference.  Educational material provided.  Patient Concerns: None at this  time. Follow up with PCP as needed.  Exercise Activities and Dietary recommendations Current Exercise Habits: Structured exercise class, Time (Minutes): 60, Frequency (Times/Week): 3, Weekly Exercise (Minutes/Week): 180, Intensity: Mild, Exercise limited by: orthopedic condition(s) (Recent R knee surgery)  Goals    . Healthy Lifestyle          Stay hydrated!  Drink plenty of water. Low carb foods.  Lean meats, fruits and vegetables.    . Increase physical activity          Stay active. Swim for exercise, as tolerated.      Fall Risk Fall Risk  04/26/2016 01/05/2016 10/12/2015 09/13/2015 08/12/2015  Falls in the past year? No No No No No  Risk for fall due to : - - - - -  Risk for fall due to (comments): - - - - -   Depression Screen PHQ 2/9 Scores 04/26/2016 01/05/2016 10/12/2015 09/13/2015  PHQ - 2 Score 0 0 0 0  Exception Documentation - - - -     Cognitive Function MMSE - Mini Mental State Exam 04/26/2016 04/27/2015  Orientation to time 5 5  Orientation to Place 5 5  Registration 3 3  Attention/ Calculation 5 5  Recall 3 3  Language- name 2 objects 2 2  Language- repeat 1 1  Language- follow 3 step command 3 3  Language- read & follow direction 1 1  Write a sentence 1 1  Copy design 1 1  Total score 30 30        Immunization History  Administered Date(s) Administered  . Influenza Split 02/25/2012  . Influenza, High Dose Seasonal PF 11/17/2015  . Influenza,inj,Quad PF,36+ Mos  02/22/2014, 10/27/2014  . Pneumococcal Conjugate-13 03/02/2015  . Zoster 11/06/2005   Screening Tests Health Maintenance  Topic Date Due  . TETANUS/TDAP  11/29/1967  . PNA vac Low Risk Adult (2 of 2 - PPSV23) 03/01/2016  . MAMMOGRAM  11/15/2016  . COLONOSCOPY  03/10/2022  . INFLUENZA VACCINE  Completed  . DEXA SCAN  Completed  . Hepatitis C Screening  Completed      Plan:    End of life planning; Advance aging; Advanced directives discussed. Copy of current HCPOA/Living Will requested.    Medicare Attestation I have personally reviewed: The patient's medical and social history Their use of alcohol, tobacco or illicit drugs Their current medications and supplements The patient's functional ability including ADLs,fall risks, home safety risks, cognitive, and hearing and visual impairment Diet and physical activities Evidence for depression   The patient's weight, height, BMI, and visual acuity have been recorded in the chart.  I have made referrals and provided education to the patient based on review of the above and I have provided the patient with a written personalized care plan for preventive services.    During the course of the visit the patient was educated and counseled about the following appropriate screening and preventive services:   Vaccines to include Pneumoccal, Influenza, Hepatitis B, Td, Zostavax, HCV  Colorectal cancer screening-UTD  Bone density screening-UTD  Glaucoma screening-annual eye exam  Mammography-UTD  Nutrition counseling   Patient Instructions (the written plan) was given to the patient.   Varney Biles, LPN  9/83/[id number]   Reviewed above information.  Agree with plan.  Dr Nicki Reaper

## 2016-04-26 NOTE — Patient Instructions (Addendum)
  Maria Keller , Thank you for taking time to come for your Medicare Wellness Visit. I appreciate your ongoing commitment to your health goals. Please review the following plan we discussed and let me know if I can assist you in the future.   Follow up with Dr. Nicki Reaper as needed.    Bring a copy of your [place] and/or Living Will to be scanned into chart.  Have a great day! These are the goals we discussed: Goals    . Healthy Lifestyle          Stay hydrated!  Drink plenty of water. Low carb foods.  Lean meats, fruits and vegetables.    . Increase physical activity          Stay active. Swim for exercise, as tolerated.       This is a list of the screening recommended for you and due dates:  Health Maintenance  Topic Date Due  . Tetanus Vaccine  11/29/1967  . Pneumonia vaccines (2 of 2 - PPSV23) 03/01/2016  . Mammogram  11/15/2016  . Colon Cancer Screening  03/10/2022  . Flu Shot  Completed  . DEXA scan (bone density measurement)  Completed  .  Hepatitis C: One time screening is recommended by Center for Disease Control  (CDC) for  adults born from [birth year] through 1967.   Completed

## 2016-04-27 DIAGNOSIS — Z96651 Presence of right artificial knee joint: Secondary | ICD-10-CM | POA: Diagnosis not present

## 2016-04-30 ENCOUNTER — Other Ambulatory Visit: Payer: Self-pay | Admitting: Internal Medicine

## 2016-04-30 DIAGNOSIS — Z96651 Presence of right artificial knee joint: Secondary | ICD-10-CM | POA: Diagnosis not present

## 2016-05-01 DIAGNOSIS — C8269 Cutaneous follicle center lymphoma, extranodal and solid organ sites: Secondary | ICD-10-CM | POA: Diagnosis not present

## 2016-05-01 DIAGNOSIS — M47816 Spondylosis without myelopathy or radiculopathy, lumbar region: Secondary | ICD-10-CM | POA: Diagnosis not present

## 2016-05-01 DIAGNOSIS — M5416 Radiculopathy, lumbar region: Secondary | ICD-10-CM | POA: Diagnosis not present

## 2016-05-01 DIAGNOSIS — M47817 Spondylosis without myelopathy or radiculopathy, lumbosacral region: Secondary | ICD-10-CM | POA: Diagnosis not present

## 2016-05-01 DIAGNOSIS — M5136 Other intervertebral disc degeneration, lumbar region: Secondary | ICD-10-CM | POA: Diagnosis not present

## 2016-05-01 DIAGNOSIS — Z79891 Long term (current) use of opiate analgesic: Secondary | ICD-10-CM | POA: Diagnosis not present

## 2016-05-01 DIAGNOSIS — M542 Cervicalgia: Secondary | ICD-10-CM | POA: Diagnosis not present

## 2016-05-01 DIAGNOSIS — M461 Sacroiliitis, not elsewhere classified: Secondary | ICD-10-CM | POA: Diagnosis not present

## 2016-05-01 DIAGNOSIS — M791 Myalgia: Secondary | ICD-10-CM | POA: Diagnosis not present

## 2016-05-01 DIAGNOSIS — M5137 Other intervertebral disc degeneration, lumbosacral region: Secondary | ICD-10-CM | POA: Diagnosis not present

## 2016-05-01 DIAGNOSIS — G894 Chronic pain syndrome: Secondary | ICD-10-CM | POA: Diagnosis not present

## 2016-05-03 DIAGNOSIS — Z96651 Presence of right artificial knee joint: Secondary | ICD-10-CM | POA: Diagnosis not present

## 2016-05-07 DIAGNOSIS — Z96651 Presence of right artificial knee joint: Secondary | ICD-10-CM | POA: Diagnosis not present

## 2016-05-09 DIAGNOSIS — Z96651 Presence of right artificial knee joint: Secondary | ICD-10-CM | POA: Diagnosis not present

## 2016-05-11 DIAGNOSIS — Z96651 Presence of right artificial knee joint: Secondary | ICD-10-CM | POA: Diagnosis not present

## 2016-05-14 DIAGNOSIS — Z96651 Presence of right artificial knee joint: Secondary | ICD-10-CM | POA: Diagnosis not present

## 2016-05-16 DIAGNOSIS — Z96651 Presence of right artificial knee joint: Secondary | ICD-10-CM | POA: Diagnosis not present

## 2016-05-18 DIAGNOSIS — Z96651 Presence of right artificial knee joint: Secondary | ICD-10-CM | POA: Diagnosis not present

## 2016-05-23 DIAGNOSIS — Z96651 Presence of right artificial knee joint: Secondary | ICD-10-CM | POA: Diagnosis not present

## 2016-05-29 DIAGNOSIS — M47817 Spondylosis without myelopathy or radiculopathy, lumbosacral region: Secondary | ICD-10-CM | POA: Diagnosis not present

## 2016-05-29 DIAGNOSIS — G894 Chronic pain syndrome: Secondary | ICD-10-CM | POA: Diagnosis not present

## 2016-05-29 DIAGNOSIS — M5416 Radiculopathy, lumbar region: Secondary | ICD-10-CM | POA: Diagnosis not present

## 2016-05-29 DIAGNOSIS — M5137 Other intervertebral disc degeneration, lumbosacral region: Secondary | ICD-10-CM | POA: Diagnosis not present

## 2016-05-29 DIAGNOSIS — M461 Sacroiliitis, not elsewhere classified: Secondary | ICD-10-CM | POA: Diagnosis not present

## 2016-05-29 DIAGNOSIS — Z79891 Long term (current) use of opiate analgesic: Secondary | ICD-10-CM | POA: Diagnosis not present

## 2016-05-29 DIAGNOSIS — M545 Low back pain: Secondary | ICD-10-CM | POA: Diagnosis not present

## 2016-05-29 DIAGNOSIS — M5136 Other intervertebral disc degeneration, lumbar region: Secondary | ICD-10-CM | POA: Diagnosis not present

## 2016-05-29 DIAGNOSIS — C8269 Cutaneous follicle center lymphoma, extranodal and solid organ sites: Secondary | ICD-10-CM | POA: Diagnosis not present

## 2016-05-29 DIAGNOSIS — M5031 Other cervical disc degeneration,  high cervical region: Secondary | ICD-10-CM | POA: Diagnosis not present

## 2016-05-29 DIAGNOSIS — M791 Myalgia: Secondary | ICD-10-CM | POA: Diagnosis not present

## 2016-05-30 ENCOUNTER — Other Ambulatory Visit: Payer: Self-pay | Admitting: Internal Medicine

## 2016-06-13 DIAGNOSIS — L820 Inflamed seborrheic keratosis: Secondary | ICD-10-CM | POA: Diagnosis not present

## 2016-06-13 DIAGNOSIS — L4052 Psoriatic arthritis mutilans: Secondary | ICD-10-CM | POA: Diagnosis not present

## 2016-06-13 DIAGNOSIS — L909 Atrophic disorder of skin, unspecified: Secondary | ICD-10-CM | POA: Diagnosis not present

## 2016-06-13 DIAGNOSIS — L400 Psoriasis vulgaris: Secondary | ICD-10-CM | POA: Diagnosis not present

## 2016-06-13 DIAGNOSIS — D229 Melanocytic nevi, unspecified: Secondary | ICD-10-CM | POA: Diagnosis not present

## 2016-06-19 DIAGNOSIS — Z96651 Presence of right artificial knee joint: Secondary | ICD-10-CM | POA: Diagnosis not present

## 2016-06-26 DIAGNOSIS — Z79891 Long term (current) use of opiate analgesic: Secondary | ICD-10-CM | POA: Diagnosis not present

## 2016-06-26 DIAGNOSIS — M461 Sacroiliitis, not elsewhere classified: Secondary | ICD-10-CM | POA: Diagnosis not present

## 2016-06-26 DIAGNOSIS — M545 Low back pain: Secondary | ICD-10-CM | POA: Diagnosis not present

## 2016-06-26 DIAGNOSIS — M47817 Spondylosis without myelopathy or radiculopathy, lumbosacral region: Secondary | ICD-10-CM | POA: Diagnosis not present

## 2016-06-26 DIAGNOSIS — M791 Myalgia: Secondary | ICD-10-CM | POA: Diagnosis not present

## 2016-06-26 DIAGNOSIS — M5136 Other intervertebral disc degeneration, lumbar region: Secondary | ICD-10-CM | POA: Diagnosis not present

## 2016-06-26 DIAGNOSIS — C8269 Cutaneous follicle center lymphoma, extranodal and solid organ sites: Secondary | ICD-10-CM | POA: Diagnosis not present

## 2016-06-26 DIAGNOSIS — M5137 Other intervertebral disc degeneration, lumbosacral region: Secondary | ICD-10-CM | POA: Diagnosis not present

## 2016-06-26 DIAGNOSIS — G894 Chronic pain syndrome: Secondary | ICD-10-CM | POA: Diagnosis not present

## 2016-06-26 DIAGNOSIS — M5031 Other cervical disc degeneration,  high cervical region: Secondary | ICD-10-CM | POA: Diagnosis not present

## 2016-06-26 DIAGNOSIS — M5416 Radiculopathy, lumbar region: Secondary | ICD-10-CM | POA: Diagnosis not present

## 2016-06-29 ENCOUNTER — Other Ambulatory Visit: Payer: Self-pay | Admitting: Internal Medicine

## 2016-07-06 ENCOUNTER — Encounter: Payer: Self-pay | Admitting: Internal Medicine

## 2016-07-06 ENCOUNTER — Ambulatory Visit (INDEPENDENT_AMBULATORY_CARE_PROVIDER_SITE_OTHER): Payer: Medicare Other | Admitting: Internal Medicine

## 2016-07-06 VITALS — BP 100/62 | HR 81 | Temp 98.6°F | Resp 12 | Ht 67.0 in | Wt 168.4 lb

## 2016-07-06 DIAGNOSIS — R21 Rash and other nonspecific skin eruption: Secondary | ICD-10-CM | POA: Diagnosis not present

## 2016-07-06 DIAGNOSIS — R6889 Other general symptoms and signs: Secondary | ICD-10-CM | POA: Diagnosis not present

## 2016-07-06 DIAGNOSIS — E7800 Pure hypercholesterolemia, unspecified: Secondary | ICD-10-CM

## 2016-07-06 DIAGNOSIS — D649 Anemia, unspecified: Secondary | ICD-10-CM

## 2016-07-06 DIAGNOSIS — M4696 Unspecified inflammatory spondylopathy, lumbar region: Secondary | ICD-10-CM | POA: Diagnosis not present

## 2016-07-06 DIAGNOSIS — L659 Nonscarring hair loss, unspecified: Secondary | ICD-10-CM | POA: Diagnosis not present

## 2016-07-06 DIAGNOSIS — Z1231 Encounter for screening mammogram for malignant neoplasm of breast: Secondary | ICD-10-CM

## 2016-07-06 DIAGNOSIS — K219 Gastro-esophageal reflux disease without esophagitis: Secondary | ICD-10-CM

## 2016-07-06 DIAGNOSIS — M797 Fibromyalgia: Secondary | ICD-10-CM

## 2016-07-06 DIAGNOSIS — C8580 Other specified types of non-Hodgkin lymphoma, unspecified site: Secondary | ICD-10-CM

## 2016-07-06 DIAGNOSIS — K589 Irritable bowel syndrome without diarrhea: Secondary | ICD-10-CM

## 2016-07-06 DIAGNOSIS — S83203D Other tear of unspecified meniscus, current injury, right knee, subsequent encounter: Secondary | ICD-10-CM

## 2016-07-06 DIAGNOSIS — Z1239 Encounter for other screening for malignant neoplasm of breast: Secondary | ICD-10-CM

## 2016-07-06 DIAGNOSIS — Z0000 Encounter for general adult medical examination without abnormal findings: Secondary | ICD-10-CM

## 2016-07-06 DIAGNOSIS — M47816 Spondylosis without myelopathy or radiculopathy, lumbar region: Secondary | ICD-10-CM

## 2016-07-06 LAB — HEPATIC FUNCTION PANEL
ALT: 27 U/L (ref 0–35)
AST: 25 U/L (ref 0–37)
Albumin: 4.7 g/dL (ref 3.5–5.2)
Alkaline Phosphatase: 78 U/L (ref 39–117)
Bilirubin, Direct: 0.2 mg/dL (ref 0.0–0.3)
TOTAL PROTEIN: 7.9 g/dL (ref 6.0–8.3)
Total Bilirubin: 0.5 mg/dL (ref 0.2–1.2)

## 2016-07-06 LAB — CBC WITH DIFFERENTIAL/PLATELET
BASOS ABS: 0.1 10*3/uL (ref 0.0–0.1)
Basophils Relative: 0.6 % (ref 0.0–3.0)
EOS ABS: 0.5 10*3/uL (ref 0.0–0.7)
Eosinophils Relative: 5.5 % — ABNORMAL HIGH (ref 0.0–5.0)
HCT: 38.3 % (ref 36.0–46.0)
HEMOGLOBIN: 12.6 g/dL (ref 12.0–15.0)
Lymphocytes Relative: 37.5 % (ref 12.0–46.0)
Lymphs Abs: 3.5 10*3/uL (ref 0.7–4.0)
MCHC: 32.8 g/dL (ref 30.0–36.0)
MCV: 78.5 fl (ref 78.0–100.0)
MONO ABS: 0.7 10*3/uL (ref 0.1–1.0)
Monocytes Relative: 7.2 % (ref 3.0–12.0)
Neutro Abs: 4.7 10*3/uL (ref 1.4–7.7)
Neutrophils Relative %: 49.2 % (ref 43.0–77.0)
Platelets: 228 10*3/uL (ref 150.0–400.0)
RBC: 4.88 Mil/uL (ref 3.87–5.11)
RDW: 16.3 % — ABNORMAL HIGH (ref 11.5–15.5)
WBC: 9.4 10*3/uL (ref 4.0–10.5)

## 2016-07-06 LAB — BASIC METABOLIC PANEL
BUN: 16 mg/dL (ref 6–23)
CALCIUM: 10 mg/dL (ref 8.4–10.5)
CO2: 34 mEq/L — ABNORMAL HIGH (ref 19–32)
CREATININE: 0.82 mg/dL (ref 0.40–1.20)
Chloride: 99 mEq/L (ref 96–112)
GFR: 73.77 mL/min (ref >60.00)
Glucose, Bld: 108 mg/dL — ABNORMAL HIGH (ref 70–99)
Potassium: 4.3 mEq/L (ref 3.5–5.1)
SODIUM: 140 meq/L (ref 135–145)

## 2016-07-06 LAB — LIPID PANEL
CHOLESTEROL: 193 mg/dL (ref 0–200)
HDL: 53.5 mg/dL (ref >39.00)
LDL CALC: 117 mg/dL — AB (ref 0–99)
NonHDL: 139.12
TRIGLYCERIDES: 110 mg/dL (ref 0.0–149.0)
Total CHOL/HDL Ratio: 4
VLDL: 22 mg/dL (ref 0.0–40.0)

## 2016-07-06 LAB — TSH: TSH: 0.81 u[IU]/mL (ref 0.35–4.50)

## 2016-07-06 NOTE — Progress Notes (Signed)
Patient ID: Maria Keller, female   DOB: 1950/10/03, 66 y.o.   MRN: [id number]   Subjective:    Patient ID: Maria Keller, female    DOB: Nov 18, 1950, 66 y.o.   MRN: [id number]  HPI  Patient with past history of hypercholesterolemia, follicular lymphoma, GERD and fibromyalgia.  She comes in today to follow up on these issues as well as for a complete physical exam.  She has adjusted her diet.  Not exercising as much secondary to recent knee surgery 03/2016.  Knee is doing better now and she plans to start exercising more.  Feels better since has lost weight.  No chest pain.  No sob.  No acid reflux.  No abdominal pain.  Bowels moving.  Some throat congestion.  Taking mucinex and robitussin.  Helping.  Still some increased congestion and drainage.  No chest congestion.  She is concerned regarding persistent hair loss.  Increased amount recently.  Some rash hairline/neck, but no other rash or scalp lesions.     Past Medical History:  Diagnosis Date  . Anemia    iron deficiency and B12 deficiency  . Bronchitis 08/2015  . Chronic back pain    followed by Dr Primus Bravo  . Depression   . Esophageal stricture    requiring dilatation x 2  . Fibromyalgia   . Fibromyalgia   . Follicular lymphoma ([place])    Followed by Dr Leretha Pol, s/p chemo and XRT  . Follicular lymphoma ([place])   . GERD (gastroesophageal reflux disease)   . Hiatal hernia   . Hypercholesterolemia   . IBS (irritable bowel syndrome)   . IBS (irritable bowel syndrome)   . Nephrolithiasis    followed by Dr Bernardo Heater, s/p stents  . Shingles outbreak 11/26/2011   Past Surgical History:  Procedure Laterality Date  . APPENDECTOMY    . BLADDER SURGERY  1993  . BREAST BIOPSY N/A   . CHOLECYSTECTOMY  1995  . LEFT OOPHORECTOMY    . LEG SURGERY Left   . ORIF ANKLE FRACTURE  2012  . OVARIAN CYST REMOVAL     right  . RECTOCELE REPAIR N/A   . REPLACEMENT TOTAL KNEE Right 03/19/2016  . TONSILECTOMY/ADENOIDECTOMY WITH MYRINGOTOMY  1970  .  TONSILLECTOMY N/A   . TUBAL LIGATION  1976  . VAGINAL HYSTERECTOMY  1980's   secondary to bleeding   Family History  Problem Relation Age of Onset  . Ulcers Mother   . Ulcers Father   . Breast cancer Unknown        great aunt and grandfather  . Lung cancer Unknown        uncle  . Hypertension Maternal Grandmother   . Heart disease Maternal Grandmother        MI 23s  . Diabetes Maternal Grandmother   . Arthritis Maternal Grandmother   . Diabetes Maternal Grandfather    Social History   Social History  . Marital status: Married    Spouse name: N/A  . Number of children: N/A  . Years of education: N/A   Social History Main Topics  . Smoking status: Never Smoker  . Smokeless tobacco: Never Used  . Alcohol use No  . Drug use: No  . Sexual activity: Yes   Other Topics Concern  . None   Social History Narrative  . None    Outpatient Encounter Prescriptions as of 07/06/2016  Medication Sig  . aspirin EC 81 MG tablet Take by mouth.  Marland Kitchen azelastine (ASTELIN) 0.1 %  nasal spray ONER SPRAY IN EACH NOSTRIL TWICE DAILY AS DIRECTED  . calcium carbonate (TUMS - DOSED IN MG ELEMENTAL CALCIUM) 500 MG chewable tablet Chew 1 tablet by mouth as needed.  . cetirizine (ZYRTEC) 10 MG tablet Take by mouth.  . conjugated estrogens (PREMARIN) vaginal cream As directed.  . cyanocobalamin 1000 MCG tablet Take 1,000 mcg by mouth daily.  . Diphenhyd-Hydrocort-Nystatin (FIRST-DUKES MOUTHWASH) SUSP Swish and spit 5 mLs every 6 (six) hours as needed for Pain.  Marland Kitchen escitalopram (LEXAPRO) 20 MG tablet TAKE ONE TABLET EVERY DAY  . Fe Fum-FePoly-Vit C-Vit B3 (INTEGRA) 62.5-62.5-40-3 MG CAPS Take 1 capsule by mouth daily.  . fentaNYL (DURAGESIC) 100 MCG/HR Apply 1 patch to skin every 2 days with fentanyl patch 25 g per hour if tolerated  . fentaNYL (DURAGESIC) 25 MCG/HR patch Apply 1 patch to skin every 2 days with fentanyl patch 100 g per hour if tolerated  . fluticasone (FLONASE) 50 MCG/ACT nasal spray  Place 2 sprays into both nostrils daily.  . furosemide (LASIX) 20 MG tablet TAKE ONE TABLET BY MOUTH EVERY DAY AS NEEDED  . Omega-3 Fatty Acids (FISH OIL) 1360 MG CAPS Take 1 capsule by mouth daily.  Marland Kitchen omeprazole (PRILOSEC) 20 MG capsule TAKE 1 CAPSULE TWICE DAILY  . oxyCODONE (ROXICODONE) 5 MG immediate release tablet Limit 1 tab by mouth per day or twice per day if tolerated for breakthrough pain while wearing fentanyl patches  . rosuvastatin (CRESTOR) 5 MG tablet TAKE ONE TABLET EVERY DAY  . senna-docusate (SENOKOT-S) 8.6-50 MG per tablet Take by mouth.  . valACYclovir (VALTREX) 500 MG tablet TAKE ONE TABLET EVERY DAY  . [DISCONTINUED] rosuvastatin (CRESTOR) 5 MG tablet TAKE ONE TABLET EVERY DAY   No facility-administered encounter medications on file as of 07/06/2016.     Review of Systems  Constitutional:       Has adjusted her diet.  Lost weight.  Feels better.   HENT: Positive for congestion and postnasal drip. Negative for sinus pressure.   Eyes: Negative for pain and visual disturbance.  Respiratory: Negative for cough, chest tightness and shortness of breath.   Cardiovascular: Negative for chest pain, palpitations and leg swelling.  Gastrointestinal: Negative for abdominal pain, diarrhea, nausea and vomiting.  Genitourinary: Negative for difficulty urinating and dysuria.  Musculoskeletal: Negative for myalgias.       Chronic pain.   Followed at pain clinic.    Skin: Negative for color change and rash.  Neurological: Negative for dizziness, light-headedness and headaches.  Hematological: Negative for adenopathy. Does not bruise/bleed easily.  Psychiatric/Behavioral: Negative for agitation and dysphoric mood.       Objective:    Physical Exam  Constitutional: She is oriented to person, place, and time. She appears well-developed and well-nourished. No distress.  HENT:  Nose: Nose normal.  Mouth/Throat: Oropharynx is clear and moist.  Eyes: Right eye exhibits no discharge.  Left eye exhibits no discharge. No scleral icterus.  Neck: Neck supple. No thyromegaly present.  Cardiovascular: Normal rate and regular rhythm.   Pulmonary/Chest: Breath sounds normal. No accessory muscle usage. No tachypnea. No respiratory distress. She has no decreased breath sounds. She has no wheezes. She has no rhonchi. Right breast exhibits no inverted nipple, no mass, no nipple discharge and no tenderness (no axillary adenopathy). Left breast exhibits no inverted nipple, no mass, no nipple discharge and no tenderness (no axilarry adenopathy).  Abdominal: Soft. Bowel sounds are normal. There is no tenderness.  Musculoskeletal: She exhibits no edema or  tenderness.  Lymphadenopathy:    She has no cervical adenopathy.  Neurological: She is alert and oriented to person, place, and time.  Skin: Skin is warm. No rash noted. No erythema.  Psychiatric: She has a normal mood and affect. Her behavior is normal.    BP 100/62 (BP Location: Left Arm, Patient Position: Sitting, Cuff Size: Normal)   Pulse 81   Temp 98.6 F (37 C) (Oral)   Resp 12   Ht '5\' 7"'  (1.702 m)   Wt 168 lb 6.4 oz (76.4 kg)   SpO2 97%   BMI 26.38 kg/m  Wt Readings from Last 3 Encounters:  07/06/16 168 lb 6.4 oz (76.4 kg)  04/26/16 175 lb (79.4 kg)  01/05/16 185 lb 9.6 oz (84.2 kg)     Lab Results  Component Value Date   WBC 9.4 07/06/2016   HGB 12.6 07/06/2016   HCT 38.3 07/06/2016   PLT 228.0 07/06/2016   GLUCOSE 108 (H) 07/06/2016   CHOL 193 07/06/2016   TRIG 110.0 07/06/2016   HDL 53.50 07/06/2016   LDLDIRECT 164.2 02/11/2013   LDLCALC 117 (H) 07/06/2016   ALT 27 07/06/2016   AST 25 07/06/2016   NA 140 07/06/2016   K 4.3 07/06/2016   CL 99 07/06/2016   CREATININE 0.82 07/06/2016   BUN 16 07/06/2016   CO2 34 (H) 07/06/2016   TSH 0.81 07/06/2016   HGBA1C 5.7 10/27/2014    Mr Knee Right Wo Contrast  Result Date: 11/30/2015 CLINICAL DATA:  Chronic anterior and posterior knee pain. History of  meniscus repair and osteoarthritis. No recent injury. EXAM: MRI OF THE RIGHT KNEE WITHOUT CONTRAST TECHNIQUE: Multiplanar, multisequence MR imaging of the knee was performed. No intravenous contrast was administered. COMPARISON:  MRI 04/17/2013. FINDINGS: MENISCI Medial meniscus: Presumed interval partial meniscectomy. The posterior horn and body are mildly diminutive with mild free edge blunting. No recurrent meniscal tear or displaced fragment identified. Lateral meniscus: Presumed interval partial meniscectomy. There is progressive horizontal signal within the meniscal body, best seen on the coronal images. Possible tiny parameniscal cyst peripheral to the meniscal body, best seen on coronal image 14. No displaced meniscal fragment. LIGAMENTS Cruciates:  Intact. Collaterals:  Intact. CARTILAGE Patellofemoral: Stable patellofemoral chondral thinning and surface irregularity. Medial: Stable chondral thinning, surface irregularity and osteophytes. Lateral: Stable chondral thinning, surface irregularity and osteophytes. MISCELLANEOUS Joint: Small joint effusion. Mild synovial irregularity without definite loose body. Popliteal Fossa:  Unremarkable. No significant Baker's cyst. Extensor Mechanism:  Intact. Bones:  No acute or significant extra-articular osseous findings. Other: No significant periarticular soft tissue findings. IMPRESSION: 1. Presumed interval partial meniscectomy medially and laterally. 2. Horizontal signal within the body of the lateral meniscus with possible adjacent tiny parameniscal cyst. These findings may indicate a small recurrent lateral meniscal tear. No displaced meniscal fragment. 3. Stable tricompartmental degenerative changes. No acute osseous findings. 4. The cruciate and collateral ligaments are intact. Electronically Signed   By: Richardean Sale M.D.   On: 11/30/2015 12:59       Assessment & Plan:   Problem List Items Addressed This Visit    Anemia    Follow cbc.        Facet syndrome, lumbar (HCC)    Chronic pain.  Followed by pain clinic.  Stable.        Fibromyalgia    Stable.  Chronic pain.  Followed by pain clinic.       GERD (gastroesophageal reflux disease)    Symptoms controlled on omeprazole.  Hair loss    Persistent increase hair loss.  Check cbc, met c and tsh.  Refer to dermatology.        Relevant Orders   CBC with Differential/Platelet (Completed)   TSH (Completed)   Ambulatory referral to Dermatology   Health care maintenance    Physical today 07/06/16.  S/p hysterectomy.  Mammogram overdue.  Schedule.  Colonoscopy 03/10/13 - melanosis and internal hemorrhoids.        Hypercholesterolemia    On crestor.  Low cholesterol diet and exercise.  Follow lipid panel and liver function tests.        Relevant Orders   Lipid panel (Completed)   Hepatic function panel (Completed)   Basic metabolic panel (Completed)   IBS (irritable bowel syndrome)    Bowels stable.        Lymphoma ([place])    Followed by Dr Leretha Pol.  Stable.  Due f/u in 02/2017.        Rash   Relevant Orders   Ambulatory referral to Dermatology    Other Visit Diagnoses    Screening for breast cancer    -  Primary   Relevant Orders   MM DIGITAL SCREENING BILATERAL   Other tear of meniscus of right knee, unspecified meniscus, unspecified whether old or current tear, subsequent encounter       Seeing ortho.  s/p surgery.  doing well.     Throat congestion       continue mucinex/robitussin.  saline nasal spray and steroid nasla spray as outlined.  hold abx.  follow.  notify me if symptoms worsen or do not resolve.         Einar Pheasant, MD

## 2016-07-06 NOTE — Assessment & Plan Note (Signed)
Physical today 07/06/16.  S/p hysterectomy.  Mammogram overdue.  Schedule.  Colonoscopy 03/10/13 - melanosis and internal hemorrhoids.

## 2016-07-08 ENCOUNTER — Encounter: Payer: Self-pay | Admitting: Internal Medicine

## 2016-07-08 NOTE — Assessment & Plan Note (Signed)
Followed by Dr Leretha Pol.  Stable.  Due f/u in 02/2017.

## 2016-07-08 NOTE — Assessment & Plan Note (Signed)
Persistent increase hair loss.  Check cbc, met c and tsh.  Refer to dermatology.

## 2016-07-08 NOTE — Assessment & Plan Note (Signed)
Chronic pain.  Followed by pain clinic.  Stable.   

## 2016-07-08 NOTE — Assessment & Plan Note (Signed)
Follow cbc.  

## 2016-07-08 NOTE — Assessment & Plan Note (Signed)
On crestor.  Low cholesterol diet and exercise.  Follow lipid panel and liver function tests.   

## 2016-07-08 NOTE — Assessment & Plan Note (Signed)
Bowels stable.  

## 2016-07-08 NOTE — Assessment & Plan Note (Signed)
Stable.  Chronic pain.  Followed by pain clinic.

## 2016-07-08 NOTE — Assessment & Plan Note (Signed)
Symptoms controlled on omeprazole.   

## 2016-07-11 DIAGNOSIS — M1711 Unilateral primary osteoarthritis, right knee: Secondary | ICD-10-CM | POA: Diagnosis not present

## 2016-07-11 DIAGNOSIS — M5136 Other intervertebral disc degeneration, lumbar region: Secondary | ICD-10-CM | POA: Diagnosis not present

## 2016-07-11 DIAGNOSIS — L409 Psoriasis, unspecified: Secondary | ICD-10-CM | POA: Insufficient documentation

## 2016-07-11 DIAGNOSIS — M150 Primary generalized (osteo)arthritis: Secondary | ICD-10-CM | POA: Diagnosis not present

## 2016-07-11 DIAGNOSIS — M1990 Unspecified osteoarthritis, unspecified site: Secondary | ICD-10-CM | POA: Insufficient documentation

## 2016-07-17 DIAGNOSIS — L659 Nonscarring hair loss, unspecified: Secondary | ICD-10-CM | POA: Diagnosis not present

## 2016-07-17 DIAGNOSIS — L738 Other specified follicular disorders: Secondary | ICD-10-CM | POA: Diagnosis not present

## 2016-07-17 DIAGNOSIS — L400 Psoriasis vulgaris: Secondary | ICD-10-CM | POA: Diagnosis not present

## 2016-07-24 DIAGNOSIS — M47817 Spondylosis without myelopathy or radiculopathy, lumbosacral region: Secondary | ICD-10-CM | POA: Diagnosis not present

## 2016-07-24 DIAGNOSIS — M461 Sacroiliitis, not elsewhere classified: Secondary | ICD-10-CM | POA: Diagnosis not present

## 2016-07-24 DIAGNOSIS — M791 Myalgia: Secondary | ICD-10-CM | POA: Diagnosis not present

## 2016-07-24 DIAGNOSIS — M5416 Radiculopathy, lumbar region: Secondary | ICD-10-CM | POA: Diagnosis not present

## 2016-08-01 DIAGNOSIS — Z1231 Encounter for screening mammogram for malignant neoplasm of breast: Secondary | ICD-10-CM | POA: Diagnosis not present

## 2016-08-01 LAB — HM MAMMOGRAPHY

## 2016-08-21 DIAGNOSIS — M47817 Spondylosis without myelopathy or radiculopathy, lumbosacral region: Secondary | ICD-10-CM | POA: Diagnosis not present

## 2016-08-21 DIAGNOSIS — M461 Sacroiliitis, not elsewhere classified: Secondary | ICD-10-CM | POA: Diagnosis not present

## 2016-08-21 DIAGNOSIS — M791 Myalgia: Secondary | ICD-10-CM | POA: Diagnosis not present

## 2016-08-21 DIAGNOSIS — M5416 Radiculopathy, lumbar region: Secondary | ICD-10-CM | POA: Diagnosis not present

## 2016-08-24 DIAGNOSIS — M9903 Segmental and somatic dysfunction of lumbar region: Secondary | ICD-10-CM | POA: Diagnosis not present

## 2016-08-24 DIAGNOSIS — M545 Low back pain: Secondary | ICD-10-CM | POA: Diagnosis not present

## 2016-08-29 DIAGNOSIS — M545 Low back pain: Secondary | ICD-10-CM | POA: Diagnosis not present

## 2016-08-29 DIAGNOSIS — M9903 Segmental and somatic dysfunction of lumbar region: Secondary | ICD-10-CM | POA: Diagnosis not present

## 2016-08-30 ENCOUNTER — Other Ambulatory Visit: Payer: Self-pay | Admitting: Internal Medicine

## 2016-09-10 ENCOUNTER — Other Ambulatory Visit
Admission: RE | Admit: 2016-09-10 | Discharge: 2016-09-10 | Disposition: A | Discharge status: Home / Self Care | Payer: Medicare Other | Source: Ambulatory Visit | Attending: Unknown Physician Specialty | Admitting: Unknown Physician Specialty

## 2016-09-10 DIAGNOSIS — Z96651 Presence of right artificial knee joint: Secondary | ICD-10-CM | POA: Insufficient documentation

## 2016-09-10 DIAGNOSIS — M797 Fibromyalgia: Secondary | ICD-10-CM | POA: Diagnosis not present

## 2016-09-10 DIAGNOSIS — M25561 Pain in right knee: Secondary | ICD-10-CM | POA: Diagnosis not present

## 2016-09-10 DIAGNOSIS — M545 Low back pain: Secondary | ICD-10-CM | POA: Diagnosis not present

## 2016-09-10 LAB — SYNOVIAL CELL COUNT + DIFF, W/ CRYSTALS
Crystals, Fluid: NONE SEEN
EOSINOPHILS-SYNOVIAL: 0 %
Lymphocytes-Synovial Fld: 89 %
Monocyte-Macrophage-Synovial Fluid: 0 %
NEUTROPHIL, SYNOVIAL: 11 %
OTHER CELLS-SYN: 0
WBC, SYNOVIAL: 368 /mm3 — AB (ref 0–200)

## 2016-09-13 LAB — BODY FLUID CULTURE: CULTURE: NO GROWTH

## 2016-09-28 NOTE — Telephone Encounter (Signed)
Error

## 2016-10-15 DIAGNOSIS — M5382 Other specified dorsopathies, cervical region: Secondary | ICD-10-CM | POA: Diagnosis not present

## 2016-10-15 DIAGNOSIS — M47817 Spondylosis without myelopathy or radiculopathy, lumbosacral region: Secondary | ICD-10-CM | POA: Diagnosis not present

## 2016-10-15 DIAGNOSIS — M461 Sacroiliitis, not elsewhere classified: Secondary | ICD-10-CM | POA: Diagnosis not present

## 2016-10-15 DIAGNOSIS — M791 Myalgia: Secondary | ICD-10-CM | POA: Diagnosis not present

## 2016-10-15 DIAGNOSIS — M5010 Cervical disc disorder with radiculopathy, unspecified cervical region: Secondary | ICD-10-CM | POA: Diagnosis not present

## 2016-10-15 DIAGNOSIS — M5136 Other intervertebral disc degeneration, lumbar region: Secondary | ICD-10-CM | POA: Diagnosis not present

## 2016-10-15 DIAGNOSIS — M5416 Radiculopathy, lumbar region: Secondary | ICD-10-CM | POA: Diagnosis not present

## 2016-10-15 DIAGNOSIS — Z5181 Encounter for therapeutic drug level monitoring: Secondary | ICD-10-CM | POA: Diagnosis not present

## 2016-10-16 ENCOUNTER — Other Ambulatory Visit: Payer: Self-pay | Admitting: Internal Medicine

## 2016-11-19 DIAGNOSIS — M5136 Other intervertebral disc degeneration, lumbar region: Secondary | ICD-10-CM | POA: Diagnosis not present

## 2016-11-19 DIAGNOSIS — M5010 Cervical disc disorder with radiculopathy, unspecified cervical region: Secondary | ICD-10-CM | POA: Diagnosis not present

## 2016-11-19 DIAGNOSIS — Z79891 Long term (current) use of opiate analgesic: Secondary | ICD-10-CM | POA: Diagnosis not present

## 2016-11-19 DIAGNOSIS — M5416 Radiculopathy, lumbar region: Secondary | ICD-10-CM | POA: Diagnosis not present

## 2016-11-19 DIAGNOSIS — M5382 Other specified dorsopathies, cervical region: Secondary | ICD-10-CM | POA: Diagnosis not present

## 2016-11-19 DIAGNOSIS — Z5181 Encounter for therapeutic drug level monitoring: Secondary | ICD-10-CM | POA: Diagnosis not present

## 2016-11-19 DIAGNOSIS — G894 Chronic pain syndrome: Secondary | ICD-10-CM | POA: Diagnosis not present

## 2016-11-19 DIAGNOSIS — M47817 Spondylosis without myelopathy or radiculopathy, lumbosacral region: Secondary | ICD-10-CM | POA: Diagnosis not present

## 2016-12-13 ENCOUNTER — Other Ambulatory Visit: Payer: Self-pay | Admitting: Internal Medicine

## 2016-12-13 NOTE — Telephone Encounter (Signed)
Last script 08/30/16 #90 1 rf Last o/v 07/06/16 F/u 01/07/17

## 2016-12-17 DIAGNOSIS — L578 Other skin changes due to chronic exposure to nonionizing radiation: Secondary | ICD-10-CM | POA: Diagnosis not present

## 2016-12-17 DIAGNOSIS — L400 Psoriasis vulgaris: Secondary | ICD-10-CM | POA: Diagnosis not present

## 2016-12-17 DIAGNOSIS — L820 Inflamed seborrheic keratosis: Secondary | ICD-10-CM | POA: Diagnosis not present

## 2016-12-17 DIAGNOSIS — L738 Other specified follicular disorders: Secondary | ICD-10-CM | POA: Diagnosis not present

## 2016-12-17 DIAGNOSIS — L650 Telogen effluvium: Secondary | ICD-10-CM | POA: Diagnosis not present

## 2016-12-31 DIAGNOSIS — M542 Cervicalgia: Secondary | ICD-10-CM | POA: Diagnosis not present

## 2016-12-31 DIAGNOSIS — Z5181 Encounter for therapeutic drug level monitoring: Secondary | ICD-10-CM | POA: Diagnosis not present

## 2016-12-31 DIAGNOSIS — M5030 Other cervical disc degeneration, unspecified cervical region: Secondary | ICD-10-CM | POA: Diagnosis not present

## 2016-12-31 DIAGNOSIS — Z96651 Presence of right artificial knee joint: Secondary | ICD-10-CM | POA: Diagnosis not present

## 2017-01-07 ENCOUNTER — Encounter: Payer: Self-pay | Admitting: Internal Medicine

## 2017-01-07 ENCOUNTER — Ambulatory Visit (INDEPENDENT_AMBULATORY_CARE_PROVIDER_SITE_OTHER): Payer: Medicare Other | Admitting: Internal Medicine

## 2017-01-07 VITALS — BP 110/66 | HR 70 | Temp 98.7°F | Resp 16 | Ht 67.0 in | Wt 177.1 lb

## 2017-01-07 DIAGNOSIS — M791 Myalgia, unspecified site: Secondary | ICD-10-CM

## 2017-01-07 DIAGNOSIS — M797 Fibromyalgia: Secondary | ICD-10-CM

## 2017-01-07 DIAGNOSIS — E7800 Pure hypercholesterolemia, unspecified: Secondary | ICD-10-CM

## 2017-01-07 DIAGNOSIS — Z23 Encounter for immunization: Secondary | ICD-10-CM

## 2017-01-07 DIAGNOSIS — K219 Gastro-esophageal reflux disease without esophagitis: Secondary | ICD-10-CM

## 2017-01-07 DIAGNOSIS — C8580 Other specified types of non-Hodgkin lymphoma, unspecified site: Secondary | ICD-10-CM

## 2017-01-07 DIAGNOSIS — M25569 Pain in unspecified knee: Secondary | ICD-10-CM

## 2017-01-07 LAB — SEDIMENTATION RATE: Sed Rate: 18 mm/hr (ref 0–30)

## 2017-01-07 MED ORDER — FUROSEMIDE 20 MG PO TABS: 20.0000 mg | ORAL_TABLET | Freq: Every day | ORAL | 1 refills | Status: DC | PRN

## 2017-01-07 NOTE — Progress Notes (Signed)
Patient ID: Maria Keller, female   DOB: [birth year], 66 y.o.   MRN: [id number]   Subjective:    Patient ID: Maria Keller, female    DOB: 07-17-1950, 66 y.o.   MRN: [id number]  HPI  Patient here for a scheduled follow up.  She reports that for the last several months she has noticed generalized aching - muscle aches.  She has a history of fibromyalgia.  States was not sure at first if this was a flare of her fibromyalgia.  Is concerned that the crestor may be contributing to her symptoms.  No fever.  No headache and no rash. Tries to stay active.  No chest pain.  No sob.  No acid reflux.  No abdominal pain reported.  Bowels moving.  Is s/p TKR 03/19/16.  Still with pain.  Was evaluated by ortho 09/10/16.  S/p aspiration.  Plans to f/u with ortho if persistent pain.  Discussed immunizations.     Past Medical History:  Diagnosis Date  . Anemia    iron deficiency and B12 deficiency  . Bronchitis 08/2015  . Chronic back pain    followed by Dr Primus Bravo  . Depression   . Esophageal stricture    requiring dilatation x 2  . Fibromyalgia   . Fibromyalgia   . Follicular lymphoma ([place])    Followed by Dr Leretha Pol, s/p chemo and XRT  . Follicular lymphoma ([place])   . GERD (gastroesophageal reflux disease)   . Hiatal hernia   . Hypercholesterolemia   . IBS (irritable bowel syndrome)   . IBS (irritable bowel syndrome)   . Nephrolithiasis    followed by Dr Bernardo Heater, s/p stents  . Shingles outbreak 11/26/2011   Past Surgical History:  Procedure Laterality Date  . APPENDECTOMY    . BLADDER SURGERY  1993  . BREAST BIOPSY N/A   . CHOLECYSTECTOMY  1995  . LEFT OOPHORECTOMY    . LEG SURGERY Left   . ORIF ANKLE FRACTURE  2012  . OVARIAN CYST REMOVAL     right  . RECTOCELE REPAIR N/A   . REPLACEMENT TOTAL KNEE Right 03/19/2016  . TONSILECTOMY/ADENOIDECTOMY WITH MYRINGOTOMY  1970  . TONSILLECTOMY N/A   . TUBAL LIGATION  1976  . VAGINAL HYSTERECTOMY  1980's   secondary to bleeding   Family History    Problem Relation Age of Onset  . Ulcers Mother   . Ulcers Father   . Breast cancer Unknown        great aunt and grandfather  . Lung cancer Unknown        uncle  . Hypertension Maternal Grandmother   . Heart disease Maternal Grandmother        MI 7s  . Diabetes Maternal Grandmother   . Arthritis Maternal Grandmother   . Diabetes Maternal Grandfather    Social History   Socioeconomic History  . Marital status: Married    Spouse name: None  . Number of children: None  . Years of education: None  . Highest education level: None  Social Needs  . Financial resource strain: None  . Food insecurity - worry: None  . Food insecurity - inability: None  . Transportation needs - medical: None  . Transportation needs - non-medical: None  Occupational History  . None  Tobacco Use  . Smoking status: Never Smoker  . Smokeless tobacco: Never Used  Substance and Sexual Activity  . Alcohol use: No    Alcohol/week: 0.0 oz  . Drug use: No  .  Sexual activity: Yes  Other Topics Concern  . None  Social History Narrative  . None    Outpatient Encounter Medications as of 01/07/2017  Medication Sig  . aspirin EC 81 MG tablet Take by mouth.  Marland Kitchen azelastine (ASTELIN) 0.1 % nasal spray ONER SPRAY IN EACH NOSTRIL TWICE DAILY AS DIRECTED  . calcium carbonate (TUMS - DOSED IN MG ELEMENTAL CALCIUM) 500 MG chewable tablet Chew 1 tablet by mouth as needed.  . cetirizine (ZYRTEC) 10 MG tablet Take by mouth.  . conjugated estrogens (PREMARIN) vaginal cream As directed.  . cyanocobalamin 1000 MCG tablet Take 1,000 mcg by mouth daily.  . Diphenhyd-Hydrocort-Nystatin (FIRST-DUKES MOUTHWASH) SUSP Swish and spit 5 mLs every 6 (six) hours as needed for Pain.  Marland Kitchen escitalopram (LEXAPRO) 20 MG tablet TAKE ONE TABLET EVERY DAY  . fentaNYL (DURAGESIC) 100 MCG/HR Apply 1 patch to skin every 2 days with fentanyl patch 25 g per hour if tolerated  . fentaNYL (DURAGESIC) 25 MCG/HR patch Apply 1 patch to skin every  2 days with fentanyl patch 100 g per hour if tolerated  . fluticasone (FLONASE) 50 MCG/ACT nasal spray Place 2 sprays into both nostrils daily.  . furosemide (LASIX) 20 MG tablet Take 1 tablet (20 mg total) by mouth daily as needed.  . Omega-3 Fatty Acids (FISH OIL) 1360 MG CAPS Take 1 capsule by mouth daily.  Marland Kitchen omeprazole (PRILOSEC) 20 MG capsule TAKE 1 CAPSULE TWICE DAILY  . oxyCODONE (ROXICODONE) 5 MG immediate release tablet Limit 1 tab by mouth per day or twice per day if tolerated for breakthrough pain while wearing fentanyl patches  . rosuvastatin (CRESTOR) 5 MG tablet TAKE ONE TABLET EVERY DAY  . senna-docusate (SENOKOT-S) 8.6-50 MG per tablet Take by mouth.  . valACYclovir (VALTREX) 500 MG tablet TAKE ONE TABLET DAILY  . [DISCONTINUED] furosemide (LASIX) 20 MG tablet TAKE ONE TABLET BY MOUTH EVERY DAY AS NEEDED  . [DISCONTINUED] Fe Fum-FePoly-Vit C-Vit B3 (INTEGRA) 62.5-62.5-40-3 MG CAPS Take 1 capsule by mouth daily.   No facility-administered encounter medications on file as of 01/07/2017.     Review of Systems  Constitutional: Negative for appetite change and unexpected weight change.  HENT: Negative for congestion and sinus pressure.   Respiratory: Negative for cough, chest tightness and shortness of breath.   Cardiovascular: Negative for chest pain, palpitations and leg swelling.  Gastrointestinal: Negative for abdominal pain, diarrhea, nausea and vomiting.  Genitourinary: Negative for difficulty urinating and dysuria.  Musculoskeletal:       Persistent knee pain as outlined.  Also aching as outlined.    Skin: Negative for color change and rash.  Neurological: Negative for dizziness, light-headedness and headaches.  Psychiatric/Behavioral: Negative for agitation and dysphoric mood.       Objective:    Physical Exam  Constitutional: She appears well-developed and well-nourished. No distress.  HENT:  Nose: Nose normal.  Mouth/Throat: Oropharynx is clear and moist.   Neck: Neck supple. No thyromegaly present.  Cardiovascular: Normal rate and regular rhythm.  Pulmonary/Chest: Breath sounds normal. No respiratory distress. She has no wheezes.  Abdominal: Soft. Bowel sounds are normal. There is no tenderness.  Musculoskeletal: She exhibits no edema or tenderness.  Lymphadenopathy:    She has no cervical adenopathy.  Skin: No rash noted. No erythema.  Psychiatric: She has a normal mood and affect. Her behavior is normal.    BP 110/66   Pulse 70   Temp 98.7 F (37.1 C)   Resp 16   Ht  5\' 7"  (1.702 m)   Wt 177 lb 2 oz (80.3 kg)   SpO2 96%   BMI 27.74 kg/m  Wt Readings from Last 3 Encounters:  01/07/17 177 lb 2 oz (80.3 kg)  07/06/16 168 lb 6.4 oz (76.4 kg)  04/26/16 175 lb (79.4 kg)     Lab Results  Component Value Date   WBC 9.4 07/06/2016   HGB 12.6 07/06/2016   HCT 38.3 07/06/2016   PLT 228.0 07/06/2016   GLUCOSE 108 (H) 07/06/2016   CHOL 193 07/06/2016   TRIG 110.0 07/06/2016   HDL 53.50 07/06/2016   LDLDIRECT 164.2 02/11/2013   LDLCALC 117 (H) 07/06/2016   ALT 27 07/06/2016   AST 25 07/06/2016   NA 140 07/06/2016   K 4.3 07/06/2016   CL 99 07/06/2016   CREATININE 0.82 07/06/2016   BUN 16 07/06/2016   CO2 34 (H) 07/06/2016   TSH 0.81 07/06/2016   HGBA1C 5.7 10/27/2014       Assessment & Plan:   Problem List Items Addressed This Visit    Fibromyalgia    Has a history of chronic pain.  Followed by pain clinic.  With aching now.  Will hold crestor as outlined.  Follow.       GERD (gastroesophageal reflux disease)    Controlled on omeprazole.       Hypercholesterolemia    On crestor.  Check lipid panel and liver function tests.  Given the increased aching, will hold crestor.  Call with update over the next 2-3 weeks.        Relevant Medications   furosemide (LASIX) 20 MG tablet   Other Relevant Orders   Hepatic function panel   Lipid panel   Basic metabolic panel   Lymphoma ([place]) - Primary    Followed by Dr  Leretha Pol.  Due f/u in 02/2017.  Stable.         Other Visit Diagnoses    Muscle ache       Relevant Orders   Sedimentation rate   CK (Creatine Kinase)   Need for influenza vaccination       Relevant Orders   Flu vaccine HIGH DOSE PF (Completed)   Need for 23-polyvalent pneumococcal polysaccharide vaccine       discussed immunization update.  pneumovax given today.    Relevant Orders   Pneumococcal polysaccharide vaccine 23-valent greater than or equal to 2yo subcutaneous/IM (Completed)   Knee pain, unspecified chronicity, unspecified laterality       S/P knee surgery.  some persistent pain.  f/u with ortho.        Einar Pheasant, MD

## 2017-01-08 ENCOUNTER — Encounter: Payer: Self-pay | Admitting: Internal Medicine

## 2017-01-08 LAB — BASIC METABOLIC PANEL
BUN: 21 mg/dL (ref 6–23)
CALCIUM: 9.7 mg/dL (ref 8.4–10.5)
CO2: 30 mEq/L (ref 19–32)
Chloride: 99 mEq/L (ref 96–112)
Creatinine, Ser: 0.77 mg/dL (ref 0.40–1.20)
GFR: 79.21 mL/min (ref >60.00)
GLUCOSE: 99 mg/dL (ref 70–99)
POTASSIUM: 4 meq/L (ref 3.5–5.1)
Sodium: 140 mEq/L (ref 135–145)

## 2017-01-08 LAB — LIPID PANEL
CHOL/HDL RATIO: 3
Cholesterol: 160 mg/dL (ref 0–200)
HDL: 50.2 mg/dL (ref >39.00)
LDL CALC: 93 mg/dL (ref 0–99)
NONHDL: 109.81
Triglycerides: 82 mg/dL (ref 0.0–149.0)
VLDL: 16.4 mg/dL (ref 0.0–40.0)

## 2017-01-08 LAB — HEPATIC FUNCTION PANEL
ALK PHOS: 65 U/L (ref 39–117)
ALT: 17 U/L (ref 0–35)
AST: 26 U/L (ref 0–37)
Albumin: 4.5 g/dL (ref 3.5–5.2)
BILIRUBIN DIRECT: 0.1 mg/dL (ref 0.0–0.3)
BILIRUBIN TOTAL: 0.6 mg/dL (ref 0.2–1.2)
TOTAL PROTEIN: 7.4 g/dL (ref 6.0–8.3)

## 2017-01-08 LAB — CK: CK TOTAL: 55 U/L (ref 7–177)

## 2017-01-08 NOTE — Assessment & Plan Note (Signed)
Has a history of chronic pain.  Followed by pain clinic.  With aching now.  Will hold crestor as outlined.  Follow.

## 2017-01-08 NOTE — Assessment & Plan Note (Signed)
On crestor.  Check lipid panel and liver function tests.  Given the increased aching, will hold crestor.  Call with update over the next 2-3 weeks.

## 2017-01-08 NOTE — Assessment & Plan Note (Signed)
Controlled on omeprazole.   

## 2017-01-08 NOTE — Assessment & Plan Note (Signed)
Followed by Dr Leretha Pol.  Due f/u in 02/2017.  Stable.

## 2017-01-10 DIAGNOSIS — M5136 Other intervertebral disc degeneration, lumbar region: Secondary | ICD-10-CM | POA: Diagnosis not present

## 2017-01-10 DIAGNOSIS — M150 Primary generalized (osteo)arthritis: Secondary | ICD-10-CM | POA: Diagnosis not present

## 2017-01-10 DIAGNOSIS — L409 Psoriasis, unspecified: Secondary | ICD-10-CM | POA: Diagnosis not present

## 2017-01-31 DIAGNOSIS — M542 Cervicalgia: Secondary | ICD-10-CM | POA: Diagnosis not present

## 2017-01-31 DIAGNOSIS — Z5181 Encounter for therapeutic drug level monitoring: Secondary | ICD-10-CM | POA: Diagnosis not present

## 2017-01-31 DIAGNOSIS — M5030 Other cervical disc degeneration, unspecified cervical region: Secondary | ICD-10-CM | POA: Diagnosis not present

## 2017-01-31 DIAGNOSIS — Z96651 Presence of right artificial knee joint: Secondary | ICD-10-CM | POA: Diagnosis not present

## 2017-02-08 ENCOUNTER — Other Ambulatory Visit: Payer: Self-pay | Admitting: *Deleted

## 2017-02-08 NOTE — Telephone Encounter (Signed)
Okay to refill? Per faxed request, this was last written on 11/28/15

## 2017-02-10 NOTE — Telephone Encounter (Signed)
Please confirm with pt if having acute symptoms or problems.  I do not mind refilling the medication, but just need a little more information.

## 2017-02-18 ENCOUNTER — Telehealth: Payer: Self-pay | Admitting: Internal Medicine

## 2017-02-18 MED ORDER — FIRST-DUKES MOUTHWASH MT SUSP: OROMUCOSAL | 0 refills | Status: DC

## 2017-02-18 NOTE — Telephone Encounter (Signed)
Copied from [place] [id number]. Topic: Quick Communication - Rx Refill/Question >> Feb 18, 2017 11:33 AM Oliver Pila B wrote: Medication: Diphenhyd-Hydrocort-Nystatin (FIRST-DUKES MOUTHWASH) SUSP [[id number]]  Has the patient contacted their pharmacy? {yes  Contact to advise

## 2017-02-18 NOTE — Telephone Encounter (Signed)
Refill request for First-Dukes mouthwash, last seen [id number], last [id number].  Please advise.

## 2017-02-18 NOTE — Telephone Encounter (Signed)
Please advise 

## 2017-02-18 NOTE — Telephone Encounter (Signed)
Sent in rx for Dukes mouthwash to [facility].  (this was the pharmacy marked).  Please confirm with pt this is where she wanted sent in.  Also, confirm pt doing ok.  If persistent problems despite mouthwash, will need to be evaluated.

## 2017-02-21 ENCOUNTER — Other Ambulatory Visit: Payer: Self-pay | Admitting: *Deleted

## 2017-02-21 MED ORDER — FIRST-DUKES MOUTHWASH MT SUSP: OROMUCOSAL | 0 refills | Status: DC

## 2017-02-22 ENCOUNTER — Telehealth: Payer: Self-pay

## 2017-02-22 NOTE — Telephone Encounter (Signed)
Copied from [place]. Topic: General - Other >> Feb 22, 2017  9:51 AM Yvette Rack wrote: Reason for CRM: Nichole from [place], [place] - [place] [phone] (Phone) [phone] (Fax) wanted to know if the provider could change the Diphenhyd-Hydrocort-Nystatin (FIRST-DUKES MOUTHWASH) SUSP to something else that don't require the Nystatin in it she states that its on back order and don't know when they will get it

## 2017-02-22 NOTE — Telephone Encounter (Signed)
Copied from [place]. Topic: General - Other >> Feb 22, 2017  9:51 AM Yvette Rack wrote: Reason for CRM: Nichole from [place], [place] - [place] [phone] (Phone) [phone] (Fax) wanted to know if the provider could change the Diphenhyd-Hydrocort-Nystatin (FIRST-DUKES MOUTHWASH) SUSP to something else that don't require the Nystatin in it she states that its on back order and don't know when they will get it

## 2017-02-22 NOTE — Telephone Encounter (Signed)
Informed patient of the situation.  Her only symptoms are having two "pen size" sores on tongue.  No other SX at this time.

## 2017-02-22 NOTE — Telephone Encounter (Signed)
Spoken to [place] at the pharmacy they do not have any Nystatin or Mycelex Troches what so ever.  The Nystatin is on national backorder.

## 2017-02-22 NOTE — Telephone Encounter (Signed)
Please advise 

## 2017-02-22 NOTE — Telephone Encounter (Signed)
[facility] call [facility] and see if they have just plain nystatin.  Of not, then see if they have mycelex troches.  If so, then I can send in rx for these if they do not have the nystatin.

## 2017-02-22 NOTE — Telephone Encounter (Signed)
Patient aware and advised to call back Monday if worsening or not better.

## 2017-02-22 NOTE — Telephone Encounter (Signed)
Gargle with salt water.  If persistent or worsening, let us know.

## 2017-02-22 NOTE — Telephone Encounter (Signed)
Please call pt and notify her that mouthwash and the other alternative that I normally use is on backorder.  Is she actively having problems now - if so, what symptoms?

## 2017-02-25 DIAGNOSIS — Z5181 Encounter for therapeutic drug level monitoring: Secondary | ICD-10-CM | POA: Diagnosis not present

## 2017-02-25 DIAGNOSIS — M5030 Other cervical disc degeneration, unspecified cervical region: Secondary | ICD-10-CM | POA: Diagnosis not present

## 2017-02-25 DIAGNOSIS — M542 Cervicalgia: Secondary | ICD-10-CM | POA: Diagnosis not present

## 2017-02-25 DIAGNOSIS — Z96651 Presence of right artificial knee joint: Secondary | ICD-10-CM | POA: Diagnosis not present

## 2017-02-28 ENCOUNTER — Other Ambulatory Visit: Payer: Self-pay | Admitting: Internal Medicine

## 2017-03-13 DIAGNOSIS — K219 Gastro-esophageal reflux disease without esophagitis: Secondary | ICD-10-CM | POA: Diagnosis not present

## 2017-03-13 DIAGNOSIS — C8238 Follicular lymphoma grade IIIa, lymph nodes of multiple sites: Secondary | ICD-10-CM | POA: Diagnosis not present

## 2017-03-25 DIAGNOSIS — Z5181 Encounter for therapeutic drug level monitoring: Secondary | ICD-10-CM | POA: Diagnosis not present

## 2017-03-25 DIAGNOSIS — C8590 Non-Hodgkin lymphoma, unspecified, unspecified site: Secondary | ICD-10-CM | POA: Diagnosis not present

## 2017-03-25 DIAGNOSIS — B0229 Other postherpetic nervous system involvement: Secondary | ICD-10-CM | POA: Diagnosis not present

## 2017-03-25 DIAGNOSIS — Z96651 Presence of right artificial knee joint: Secondary | ICD-10-CM | POA: Diagnosis not present

## 2017-04-05 ENCOUNTER — Other Ambulatory Visit: Payer: Self-pay | Admitting: Internal Medicine

## 2017-04-05 DIAGNOSIS — Z8371 Family history of colonic polyps: Secondary | ICD-10-CM | POA: Diagnosis not present

## 2017-04-05 DIAGNOSIS — R1310 Dysphagia, unspecified: Secondary | ICD-10-CM | POA: Diagnosis not present

## 2017-04-05 DIAGNOSIS — K219 Gastro-esophageal reflux disease without esophagitis: Secondary | ICD-10-CM | POA: Diagnosis not present

## 2017-04-22 DIAGNOSIS — B0229 Other postherpetic nervous system involvement: Secondary | ICD-10-CM | POA: Diagnosis not present

## 2017-04-22 DIAGNOSIS — Z96651 Presence of right artificial knee joint: Secondary | ICD-10-CM | POA: Diagnosis not present

## 2017-04-22 DIAGNOSIS — C8590 Non-Hodgkin lymphoma, unspecified, unspecified site: Secondary | ICD-10-CM | POA: Diagnosis not present

## 2017-04-22 DIAGNOSIS — Z5181 Encounter for therapeutic drug level monitoring: Secondary | ICD-10-CM | POA: Diagnosis not present

## 2017-04-29 ENCOUNTER — Ambulatory Visit (INDEPENDENT_AMBULATORY_CARE_PROVIDER_SITE_OTHER): Payer: Medicare Other

## 2017-04-29 VITALS — BP 106/62 | HR 79 | Temp 98.1°F | Resp 14 | Ht 67.0 in | Wt 179.6 lb

## 2017-04-29 DIAGNOSIS — Z0000 Encounter for general adult medical examination without abnormal findings: Secondary | ICD-10-CM | POA: Diagnosis not present

## 2017-04-29 NOTE — Progress Notes (Addendum)
Subjective:   Maria Keller is a 67 y.o. female who presents for Medicare Annual (Subsequent) preventive examination.  Review of Systems:  No ROS.  Medicare Wellness Visit. Additional risk factors are reflected in the social history.  Cardiac Risk Factors include: advanced age (>72men, >60 women)     Objective:     Vitals: BP 106/62 (BP Location: Left Arm, Patient Position: Sitting, Cuff Size: Normal)   Pulse 79   Temp 98.1 F (36.7 C) (Oral)   Resp 14   Ht 5\' 7"  (1.702 m)   Wt 179 lb 9.6 oz (81.5 kg)   SpO2 96%   BMI 28.13 kg/m   Body mass index is 28.13 kg/m.  Advanced Directives 04/29/2017 04/26/2016 10/12/2015 09/13/2015 08/12/2015 07/12/2015 05/10/2015  Does Patient Have a Medical Advance Directive? Yes Yes Yes Yes Yes Yes Yes  Type of Paramedic of [address];Living will [place];Living will Living will Living will;Healthcare Power of [place];Living will [place];Living will  Does patient want to make changes to medical advance directive? No - Patient declined No - Patient declined - No - Patient declined - - No - Patient declined  Copy of [place] in Chart? No - copy requested No - copy requested - No - copy requested - Yes No - copy requested    Tobacco Social History   Tobacco Use  Smoking Status Never Smoker  Smokeless Tobacco Never Used     Counseling given: Not Answered   Clinical Intake:  Pre-visit preparation completed: Yes  Pain : No/denies pain     Nutritional Status: BMI 25 -29 Overweight Diabetes: No  How often do you need to have someone help you when you read instructions, pamphlets, or other written materials from your doctor or pharmacy?: 1 - Never  Interpreter Needed?: No     Past Medical History:  Diagnosis Date  . Anemia    iron deficiency and B12 deficiency  . Bronchitis 08/2015  . Chronic back pain    followed by Dr Primus Bravo    . Depression   . Esophageal stricture    requiring dilatation x 2  . Fibromyalgia   . Fibromyalgia   . Follicular lymphoma ([place])    Followed by Dr Leretha Pol, s/p chemo and XRT  . Follicular lymphoma ([place])   . GERD (gastroesophageal reflux disease)   . Hiatal hernia   . Hypercholesterolemia   . IBS (irritable bowel syndrome)   . IBS (irritable bowel syndrome)   . Nephrolithiasis    followed by Dr Bernardo Heater, s/p stents  . Shingles outbreak 11/26/2011   Past Surgical History:  Procedure Laterality Date  . APPENDECTOMY    . BLADDER SURGERY  1993  . BREAST BIOPSY N/A   . CHOLECYSTECTOMY  1995  . LEFT OOPHORECTOMY    . LEG SURGERY Left   . ORIF ANKLE FRACTURE  2012  . OVARIAN CYST REMOVAL     right  . RECTOCELE REPAIR N/A   . REPLACEMENT TOTAL KNEE Right 03/19/2016  . TONSILECTOMY/ADENOIDECTOMY WITH MYRINGOTOMY  1970  . TONSILLECTOMY N/A   . TUBAL LIGATION  1976  . VAGINAL HYSTERECTOMY  1980's   secondary to bleeding   Family History  Problem Relation Age of Onset  . Ulcers Mother   . Ulcers Father   . Breast cancer Unknown        great aunt and grandfather  . Lung cancer Unknown  uncle  . Hypertension Maternal Grandmother   . Heart disease Maternal Grandmother        MI 39s  . Diabetes Maternal Grandmother   . Arthritis Maternal Grandmother   . Diabetes Maternal Grandfather    Social History   Socioeconomic History  . Marital status: Married    Spouse name: Not on file  . Number of children: Not on file  . Years of education: Not on file  . Highest education level: Not on file  Occupational History  . Not on file  Social Needs  . Financial resource strain: Not on file  . Food insecurity:    Worry: Not on file    Inability: Not on file  . Transportation needs:    Medical: Not on file    Non-medical: Not on file  Tobacco Use  . Smoking status: Never Smoker  . Smokeless tobacco: Never Used  Substance and Sexual Activity  . Alcohol use: No     Alcohol/week: 0.0 oz  . Drug use: No  . Sexual activity: Yes  Lifestyle  . Physical activity:    Days per week: Not on file    Minutes per session: Not on file  . Stress: Not on file  Relationships  . Social connections:    Talks on phone: Not on file    Gets together: Not on file    Attends religious service: Not on file    Active member of club or organization: Not on file    Attends meetings of clubs or organizations: Not on file    Relationship status: Not on file  Other Topics Concern  . Not on file  Social History Narrative  . Not on file    Outpatient Encounter Medications as of 04/29/2017  Medication Sig  . aspirin EC 81 MG tablet Take by mouth.  Marland Kitchen azelastine (ASTELIN) 0.1 % nasal spray ONER SPRAY IN EACH NOSTRIL TWICE DAILY AS DIRECTED  . calcium carbonate (TUMS - DOSED IN MG ELEMENTAL CALCIUM) 500 MG chewable tablet Chew 1 tablet by mouth as needed.  . cetirizine (ZYRTEC) 10 MG tablet Take by mouth.  . cyanocobalamin 1000 MCG tablet Take 1,000 mcg by mouth daily.  . Diphenhyd-Hydrocort-Nystatin (FIRST-DUKES MOUTHWASH) SUSP Swish and spit 5 mLs tid prn  . escitalopram (LEXAPRO) 20 MG tablet TAKE ONE TABLET BY MOUTH EVERY DAY  . fentaNYL (DURAGESIC) 100 MCG/HR Apply 1 patch to skin every 2 days with fentanyl patch 25 g per hour if tolerated  . fentaNYL (DURAGESIC) 25 MCG/HR patch Apply 1 patch to skin every 2 days with fentanyl patch 100 g per hour if tolerated  . fluticasone (FLONASE) 50 MCG/ACT nasal spray Place 2 sprays into both nostrils daily.  . furosemide (LASIX) 20 MG tablet Take 1 tablet (20 mg total) by mouth daily as needed.  . Omega-3 Fatty Acids (FISH OIL) 1360 MG CAPS Take 1 capsule by mouth daily.  Marland Kitchen omeprazole (PRILOSEC) 20 MG capsule TAKE 1 CAPSULE TWICE DAILY  . oxyCODONE (ROXICODONE) 5 MG immediate release tablet Limit 1 tab by mouth per day or twice per day if tolerated for breakthrough pain while wearing fentanyl patches  . senna-docusate  (SENOKOT-S) 8.6-50 MG per tablet Take by mouth.  . valACYclovir (VALTREX) 500 MG tablet TAKE ONE TABLET DAILY  . [DISCONTINUED] conjugated estrogens (PREMARIN) vaginal cream As directed.  . [DISCONTINUED] rosuvastatin (CRESTOR) 5 MG tablet TAKE ONE TABLET EVERY DAY   No facility-administered encounter medications on file as of 04/29/2017.  Activities of Daily Living In your present state of health, do you have any difficulty performing the following activities: 04/29/2017  Hearing? N  Vision? N  Difficulty concentrating or making decisions? N  Walking or climbing stairs? Y  Comment Difficulty climbing stairs. Paces self.    Dressing or bathing? N  Doing errands, shopping? N  Preparing Food and eating ? N  Using the Toilet? N  In the past six months, have you accidently leaked urine? Y  Comment Managed with a daily liner  Do you have problems with loss of bowel control? N  Managing your Medications? N  Managing your Finances? N  Housekeeping or managing your Housekeeping? Y  Comment Husband assists as needed  Some recent data might be hidden    Patient Care Team: Einar Pheasant, MD as PCP - General (Internal Medicine)    Assessment:   This is a routine wellness examination for [place].  The goal of the wellness visit is to assist the patient how to close the gaps in care and create a preventative care plan for the patient.   The roster of all physicians providing medical care to patient is listed in the Snapshot section of the chart.  Taking calcium VIT D as appropriate/Osteoporosis risk reviewed.    Safety issues reviewed; Smoke and carbon monoxide detectors in the home. No firearms or firearms locked in a safe within the home. Wears seatbelts when driving or riding with others. No violence in the home.  They do not have excessive sun exposure.  Discussed the need for sun protection: hats, long sleeves and the use of sunscreen if there is significant sun  exposure.  Patient is alert, normal appearance, oriented to person/place/and time.  Correctly identified the president of the [place] and recalls of 3/3 words. Performs simple calculations and can read correct time from watch face. Displays appropriate judgement.  No new identified risk were noted.  No failures at ADL's or IADL's.  BMI- discussed the importance of a healthy diet, water intake and the benefits of aerobic exercise. Educational material provided.   24 hour diet recall: Low carb diet  Dental- every 6 months.  Eye- Visual acuity not assessed per patient preference since they have regular follow up with the ophthalmologist.  Wears corrective lenses.  Sleep patterns- Sleeps 4-5 hours at night.  Naps during the day.   TDAP vaccine deferred per patient preference.  Follow up with insurance.  Educational material provided.  Patient Concerns: Headaches with nausea x1 month.  Appointment scheduled tomorrow for follow up.  Exercise Activities and Dietary recommendations Current Exercise Habits: Home exercise routine, Type of exercise: walking(Swimming during the summer), Time (Minutes): 10, Frequency (Times/Week): 2, Weekly Exercise (Minutes/Week): 20, Intensity: Mild, Exercise limited by: neurologic condition(s)  Goals    . Healthy Lifestyle     Stay hydrated!  Drink plenty of water. Low carb foods.  Lean meats, fruits and vegetables.    . Increase physical activity     Stay active. Swim for exercise, as tolerated.       Fall Risk Fall Risk  04/29/2017 04/26/2016 01/05/2016 10/12/2015 09/13/2015  Falls in the past year? No No No No No  Comment - - - - -  Risk for fall due to : - - - - -  Risk for fall due to: Comment - - - - -   Depression Screen PHQ 2/9 Scores 04/29/2017 04/26/2016 01/05/2016 10/12/2015  PHQ - 2 Score 0 0 0 0  Exception Documentation - - - -  Cognitive Function MMSE - Mini Mental State Exam 04/29/2017 04/26/2016 04/27/2015  Orientation to time 5 5 5    Orientation to Place 5 5 5   Registration 3 3 3   Attention/ Calculation 5 5 5   Recall 3 3 3   Language- name 2 objects 2 2 2   Language- repeat 1 1 1   Language- follow 3 step command 3 3 3   Language- read & follow direction 1 1 1   Write a sentence 1 1 1   Copy design 1 1 1   Total score 30 30 30         Immunization History  Administered Date(s) Administered  . Influenza Split 02/25/2012  . Influenza, High Dose Seasonal PF 11/17/2015, 01/07/2017  . Influenza,inj,Quad PF,6+ Mos 02/22/2014, 10/27/2014  . Influenza-Unspecified 02/29/2012, 10/28/2012, 02/22/2014, 10/27/2014, 11/17/2015  . Pneumococcal Conjugate-13 03/02/2015  . Pneumococcal Polysaccharide-23 01/07/2017  . Zoster 11/06/2005    Screening Tests Health Maintenance  Topic Date Due  . TETANUS/TDAP  11/29/1967  . MAMMOGRAM  08/02/2018  . COLONOSCOPY  03/10/2022  . INFLUENZA VACCINE  Completed  . DEXA SCAN  Completed  . Hepatitis C Screening  Completed  . PNA vac Low Risk Adult  Completed       Plan:    End of life planning; Advance aging; Advanced directives discussed. Copy of current HCPOA/Living Will requested.    I have personally reviewed and noted the following in the patient's chart:   . Medical and social history . Use of alcohol, tobacco or illicit drugs  . Current medications and supplements . Functional ability and status . Nutritional status . Physical activity . Advanced directives . List of other physicians . Hospitalizations, surgeries, and ER visits in previous 12 months . Vitals . Screenings to include cognitive, depression, and falls . Referrals and appointments  In addition, I have reviewed and discussed with patient certain preventive protocols, quality metrics, and best practice recommendations. A written personalized care plan for preventive services as well as general preventive health recommendations were provided to patient.     Varney Biles,  LPN  [id number]    Received 04/30/17.  Reviewed.  Agree with need for evaluation.  Per review, she has appt with me today.    Dr Nicki Reaper

## 2017-04-29 NOTE — Patient Instructions (Addendum)
  Maria Keller , Thank you for taking time to come for your Medicare Wellness Visit. I appreciate your ongoing commitment to your health goals. Please review the following plan we discussed and let me know if I can assist you in the future.   TDAP and Shingles vaccine with local pharcy.  Notify when completed.   These are the goals we discussed: Goals    . Healthy Lifestyle     Stay hydrated!  Drink plenty of water. Low carb foods.  Lean meats, fruits and vegetables.    . Increase physical activity     Stay active. Swim for exercise, as tolerated.       This is a list of the screening recommended for you and due dates:  Health Maintenance  Topic Date Due  . Tetanus Vaccine  11/29/1967  . Mammogram  08/02/2018  . Colon Cancer Screening  03/10/2022  . Flu Shot  Completed  . DEXA scan (bone density measurement)  Completed  .  Hepatitis C: One time screening is recommended by Center for Disease Control  (CDC) for  adults born from [birth year] through 1967.   Completed  . Pneumonia vaccines  Completed

## 2017-04-30 ENCOUNTER — Ambulatory Visit (INDEPENDENT_AMBULATORY_CARE_PROVIDER_SITE_OTHER): Payer: Medicare Other | Admitting: Internal Medicine

## 2017-04-30 DIAGNOSIS — C8580 Other specified types of non-Hodgkin lymphoma, unspecified site: Secondary | ICD-10-CM | POA: Diagnosis not present

## 2017-04-30 DIAGNOSIS — R519 Headache, unspecified: Secondary | ICD-10-CM

## 2017-04-30 DIAGNOSIS — R51 Headache: Secondary | ICD-10-CM | POA: Diagnosis not present

## 2017-04-30 DIAGNOSIS — G8929 Other chronic pain: Secondary | ICD-10-CM

## 2017-04-30 DIAGNOSIS — M545 Low back pain: Secondary | ICD-10-CM

## 2017-04-30 MED ORDER — MAGNESIUM OXIDE 400 MG PO TABS: 400.0000 mg | ORAL_TABLET | Freq: Every day | ORAL | 1 refills | Status: DC

## 2017-04-30 NOTE — Progress Notes (Signed)
Patient ID: Maria Keller, female   DOB: 30-Nov-1950, 67 y.o.   MRN: [id number]   Subjective:    Patient ID: Maria Keller, female    DOB: March 26, 1950, 67 y.o.   MRN: [id number]  HPI  Patient here as a work in with concerns regarding headache.  She reports that she started having headaches approximately one month ago.  Intermittent.  Occurring at least once a week. One lasted 5 days.  Most last 2-3 days.  Some blurred vision that occurs with the headache (blurring like heat coming off the road).  After this occurs, then will have headache.  Is more sensitive to light and sound.  Some nausea associated.  No vomiting.  Will start frontal headache and then radiate over head to her neck.  She has a history of migraines.  States these headaches feel similar, except these last longer and she does not have any vomiting with these headaches (just nausea).  She reports to treat the headache she gets in a dark quiet room.  Applies a cold cloth and takes tylenol.  No other symptoms. No headache now.  No fever.  No chest congestion or sinus congestion.  No other vision change.  Due to have her eyes checked soon.  Gets checked every year.      Past Medical History:  Diagnosis Date  . Anemia    iron deficiency and B12 deficiency  . Bronchitis 08/2015  . Chronic back pain    followed by Dr Primus Bravo  . Depression   . Esophageal stricture    requiring dilatation x 2  . Fibromyalgia   . Fibromyalgia   . Follicular lymphoma ([place])    Followed by Dr Leretha Pol, s/p chemo and XRT  . Follicular lymphoma ([place])   . GERD (gastroesophageal reflux disease)   . Hiatal hernia   . Hypercholesterolemia   . IBS (irritable bowel syndrome)   . IBS (irritable bowel syndrome)   . Nephrolithiasis    followed by Dr Bernardo Heater, s/p stents  . Shingles outbreak 11/26/2011   Past Surgical History:  Procedure Laterality Date  . APPENDECTOMY    . BLADDER SURGERY  1993  . BREAST BIOPSY N/A   . CHOLECYSTECTOMY  1995  . LEFT  OOPHORECTOMY    . LEG SURGERY Left   . ORIF ANKLE FRACTURE  2012  . OVARIAN CYST REMOVAL     right  . RECTOCELE REPAIR N/A   . REPLACEMENT TOTAL KNEE Right 03/19/2016  . TONSILECTOMY/ADENOIDECTOMY WITH MYRINGOTOMY  1970  . TONSILLECTOMY N/A   . TUBAL LIGATION  1976  . VAGINAL HYSTERECTOMY  1980's   secondary to bleeding   Family History  Problem Relation Age of Onset  . Ulcers Mother   . Ulcers Father   . Breast cancer Unknown        great aunt and grandfather  . Lung cancer Unknown        uncle  . Hypertension Maternal Grandmother   . Heart disease Maternal Grandmother        MI 70s  . Diabetes Maternal Grandmother   . Arthritis Maternal Grandmother   . Diabetes Maternal Grandfather    Social History   Socioeconomic History  . Marital status: Married    Spouse name: Not on file  . Number of children: Not on file  . Years of education: Not on file  . Highest education level: Not on file  Occupational History  . Not on file  Social Needs  . Financial  resource strain: Not on file  . Food insecurity:    Worry: Not on file    Inability: Not on file  . Transportation needs:    Medical: Not on file    Non-medical: Not on file  Tobacco Use  . Smoking status: Never Smoker  . Smokeless tobacco: Never Used  Substance and Sexual Activity  . Alcohol use: No    Alcohol/week: 0.0 oz  . Drug use: No  . Sexual activity: Yes  Lifestyle  . Physical activity:    Days per week: Not on file    Minutes per session: Not on file  . Stress: Not on file  Relationships  . Social connections:    Talks on phone: Not on file    Gets together: Not on file    Attends religious service: Not on file    Active member of club or organization: Not on file    Attends meetings of clubs or organizations: Not on file    Relationship status: Not on file  Other Topics Concern  . Not on file  Social History Narrative  . Not on file    Outpatient Encounter Medications as of 04/30/2017    Medication Sig  . aspirin EC 81 MG tablet Take by mouth.  Marland Kitchen azelastine (ASTELIN) 0.1 % nasal spray ONER SPRAY IN EACH NOSTRIL TWICE DAILY AS DIRECTED  . calcium carbonate (TUMS - DOSED IN MG ELEMENTAL CALCIUM) 500 MG chewable tablet Chew 1 tablet by mouth as needed.  . cetirizine (ZYRTEC) 10 MG tablet Take by mouth.  . cyanocobalamin 1000 MCG tablet Take 1,000 mcg by mouth daily.  . Diphenhyd-Hydrocort-Nystatin (FIRST-DUKES MOUTHWASH) SUSP Swish and spit 5 mLs tid prn  . escitalopram (LEXAPRO) 20 MG tablet TAKE ONE TABLET BY MOUTH EVERY DAY  . fentaNYL (DURAGESIC) 100 MCG/HR Apply 1 patch to skin every 2 days with fentanyl patch 25 g per hour if tolerated  . fentaNYL (DURAGESIC) 25 MCG/HR patch Apply 1 patch to skin every 2 days with fentanyl patch 100 g per hour if tolerated  . fluticasone (FLONASE) 50 MCG/ACT nasal spray Place 2 sprays into both nostrils daily.  . furosemide (LASIX) 20 MG tablet Take 1 tablet (20 mg total) by mouth daily as needed.  . Omega-3 Fatty Acids (FISH OIL) 1360 MG CAPS Take 1 capsule by mouth daily.  Marland Kitchen omeprazole (PRILOSEC) 20 MG capsule TAKE 1 CAPSULE TWICE DAILY  . oxyCODONE (ROXICODONE) 5 MG immediate release tablet Limit 1 tab by mouth per day or twice per day if tolerated for breakthrough pain while wearing fentanyl patches  . senna-docusate (SENOKOT-S) 8.6-50 MG per tablet Take by mouth.  . valACYclovir (VALTREX) 500 MG tablet TAKE ONE TABLET DAILY  . magnesium oxide (MAG-OX) 400 MG tablet Take 1 tablet (400 mg total) by mouth daily.   No facility-administered encounter medications on file as of 04/30/2017.     Review of Systems  Constitutional: Negative for appetite change and fever.  HENT: Negative for congestion and sinus pressure.   Respiratory: Negative for cough, chest tightness and shortness of breath.   Cardiovascular: Negative for chest pain, palpitations and leg swelling.  Gastrointestinal: Negative for abdominal pain, diarrhea and vomiting.        Some nausea associated with headache.   Genitourinary: Negative for difficulty urinating and dysuria.  Musculoskeletal: Negative for joint swelling and myalgias.  Skin: Negative for color change and rash.  Neurological: Positive for headaches. Negative for dizziness and light-headedness.  Psychiatric/Behavioral: Negative for agitation  and dysphoric mood.       Objective:     Blood pressure rechecked by me:  122/82  Physical Exam  Constitutional: She appears well-developed and well-nourished. No distress.  HENT:  Nose: Nose normal.  Mouth/Throat: Oropharynx is clear and moist.  Eyes:  Fundi visualized appeared to be benign.   Neck: Neck supple. No thyromegaly present.  Cardiovascular: Normal rate and regular rhythm.  Pulmonary/Chest: Breath sounds normal. No respiratory distress. She has no wheezes.  Abdominal: Soft. Bowel sounds are normal. There is no tenderness.  Musculoskeletal: She exhibits no edema or tenderness.  Lymphadenopathy:    She has no cervical adenopathy.  Skin: No rash noted. No erythema.  Psychiatric: She has a normal mood and affect. Her behavior is normal.    BP 110/76 (BP Location: Left Arm, Patient Position: Sitting, Cuff Size: Normal)   Pulse 80   Temp 97.8 F (36.6 C) (Oral)   Resp 18   Wt 178 lb (80.7 kg)   SpO2 97%   BMI 27.88 kg/m  Wt Readings from Last 3 Encounters:  04/30/17 178 lb (80.7 kg)  04/29/17 179 lb 9.6 oz (81.5 kg)  01/07/17 177 lb 2 oz (80.3 kg)     Lab Results  Component Value Date   WBC 9.4 07/06/2016   HGB 12.6 07/06/2016   HCT 38.3 07/06/2016   PLT 228.0 07/06/2016   GLUCOSE 99 01/07/2017   CHOL 160 01/07/2017   TRIG 82.0 01/07/2017   HDL 50.20 01/07/2017   LDLDIRECT 164.2 02/11/2013   LDLCALC 93 01/07/2017   ALT 17 01/07/2017   AST 26 01/07/2017   NA 140 01/07/2017   K 4.0 01/07/2017   CL 99 01/07/2017   CREATININE 0.77 01/07/2017   BUN 21 01/07/2017   CO2 30 01/07/2017   TSH 0.81 07/06/2016   HGBA1C  5.7 10/27/2014       Assessment & Plan:   Problem List Items Addressed This Visit    Chronic back pain    Followed by pain clinic.       Headache    Persistent intermittent headache.  Has a history of migraines.  Headache change.  Now occurring more frequent with some change as outlined.  Given change in headache with h/o lymphoma, feel MRI warranted.  Start magnesium daily.  Hold on additional pain medication.  Discussed referral to neurology.        Relevant Orders   Ambulatory referral to Neurology   Lymphoma [facility])    Followed by Dr Leretha Pol.            Einar Pheasant, MD

## 2017-05-01 ENCOUNTER — Encounter: Payer: Self-pay | Admitting: Internal Medicine

## 2017-05-02 ENCOUNTER — Other Ambulatory Visit: Payer: Self-pay | Admitting: Internal Medicine

## 2017-05-02 DIAGNOSIS — R519 Headache, unspecified: Secondary | ICD-10-CM

## 2017-05-02 DIAGNOSIS — R51 Headache: Principal | ICD-10-CM

## 2017-05-03 ENCOUNTER — Encounter: Payer: Self-pay | Admitting: Internal Medicine

## 2017-05-03 DIAGNOSIS — R51 Headache: Secondary | ICD-10-CM

## 2017-05-03 DIAGNOSIS — R519 Headache, unspecified: Secondary | ICD-10-CM | POA: Insufficient documentation

## 2017-05-03 NOTE — Assessment & Plan Note (Signed)
Followed by Dr Leretha Pol.

## 2017-05-03 NOTE — Assessment & Plan Note (Signed)
Followed by pain clinic.  

## 2017-05-03 NOTE — Assessment & Plan Note (Signed)
Persistent intermittent headache.  Has a history of migraines.  Headache change.  Now occurring more frequent with some change as outlined.  Given change in headache with h/o lymphoma, feel MRI warranted.  Start magnesium daily.  Hold on additional pain medication.  Discussed referral to neurology.

## 2017-05-07 DIAGNOSIS — H04123 Dry eye syndrome of bilateral lacrimal glands: Secondary | ICD-10-CM | POA: Diagnosis not present

## 2017-05-07 DIAGNOSIS — H1840 Unspecified corneal degeneration: Secondary | ICD-10-CM | POA: Diagnosis not present

## 2017-05-07 DIAGNOSIS — H43813 Vitreous degeneration, bilateral: Secondary | ICD-10-CM | POA: Diagnosis not present

## 2017-05-07 DIAGNOSIS — H35373 Puckering of macula, bilateral: Secondary | ICD-10-CM | POA: Diagnosis not present

## 2017-05-14 ENCOUNTER — Ambulatory Visit
Admission: RE | Admit: 2017-05-14 | Discharge: 2017-05-14 | Disposition: A | Discharge status: Home / Self Care | Payer: Medicare Other | Source: Ambulatory Visit | Attending: Internal Medicine | Admitting: Internal Medicine

## 2017-05-14 DIAGNOSIS — R9089 Other abnormal findings on diagnostic imaging of central nervous system: Secondary | ICD-10-CM | POA: Diagnosis not present

## 2017-05-14 DIAGNOSIS — R519 Headache, unspecified: Secondary | ICD-10-CM

## 2017-05-14 DIAGNOSIS — G43909 Migraine, unspecified, not intractable, without status migrainosus: Secondary | ICD-10-CM | POA: Diagnosis not present

## 2017-05-14 DIAGNOSIS — R51 Headache: Secondary | ICD-10-CM | POA: Insufficient documentation

## 2017-05-14 LAB — POCT I-STAT CREATININE: Creatinine, Ser: 0.8 mg/dL (ref 0.44–1.00)

## 2017-05-14 MED ORDER — GADOBENATE DIMEGLUMINE 529 MG/ML IV SOLN
15.0000 mL | Freq: Once | INTRAVENOUS | Status: AC | PRN
  Administered 2017-05-14: 15 mL via INTRAVENOUS

## 2017-05-15 ENCOUNTER — Telehealth: Payer: Self-pay | Admitting: Internal Medicine

## 2017-05-15 NOTE — Telephone Encounter (Signed)
Copied from [place] [id number]. Topic: Quick Communication - See Telephone Encounter >> May 15, 2017 11:47 AM Bea Graff, NT wrote: CRM for notification. See Telephone encounter for: 05/15/17. Pt would like a call with her MRI results.

## 2017-05-16 NOTE — Telephone Encounter (Signed)
I do not see where MRI has been resulted. Please advise

## 2017-05-17 NOTE — Telephone Encounter (Signed)
Pt notified of MRI results.  Already has appt scheduled with neurology.

## 2017-05-20 DIAGNOSIS — C8590 Non-Hodgkin lymphoma, unspecified, unspecified site: Secondary | ICD-10-CM | POA: Diagnosis not present

## 2017-05-20 DIAGNOSIS — Z96651 Presence of right artificial knee joint: Secondary | ICD-10-CM | POA: Diagnosis not present

## 2017-05-20 DIAGNOSIS — Z5181 Encounter for therapeutic drug level monitoring: Secondary | ICD-10-CM | POA: Diagnosis not present

## 2017-05-20 DIAGNOSIS — B0229 Other postherpetic nervous system involvement: Secondary | ICD-10-CM | POA: Diagnosis not present

## 2017-06-05 ENCOUNTER — Other Ambulatory Visit: Payer: Self-pay | Admitting: Internal Medicine

## 2017-06-11 DIAGNOSIS — H04123 Dry eye syndrome of bilateral lacrimal glands: Secondary | ICD-10-CM | POA: Diagnosis not present

## 2017-06-11 DIAGNOSIS — H18453 Nodular corneal degeneration, bilateral: Secondary | ICD-10-CM | POA: Diagnosis not present

## 2017-06-13 DIAGNOSIS — G43119 Migraine with aura, intractable, without status migrainosus: Secondary | ICD-10-CM | POA: Diagnosis not present

## 2017-06-17 DIAGNOSIS — C8590 Non-Hodgkin lymphoma, unspecified, unspecified site: Secondary | ICD-10-CM | POA: Diagnosis not present

## 2017-06-17 DIAGNOSIS — B0229 Other postherpetic nervous system involvement: Secondary | ICD-10-CM | POA: Diagnosis not present

## 2017-06-17 DIAGNOSIS — Z96651 Presence of right artificial knee joint: Secondary | ICD-10-CM | POA: Diagnosis not present

## 2017-06-17 DIAGNOSIS — Z5181 Encounter for therapeutic drug level monitoring: Secondary | ICD-10-CM | POA: Diagnosis not present

## 2017-06-18 ENCOUNTER — Encounter: Payer: Self-pay | Admitting: *Deleted

## 2017-06-19 ENCOUNTER — Other Ambulatory Visit: Payer: Self-pay

## 2017-06-19 ENCOUNTER — Ambulatory Visit
Admission: RE | Admit: 2017-06-19 | Discharge: 2017-06-19 | Disposition: A | Discharge status: Home / Self Care | Payer: Medicare Other | Source: Ambulatory Visit | Attending: Unknown Physician Specialty | Admitting: Unknown Physician Specialty

## 2017-06-19 ENCOUNTER — Ambulatory Visit: Payer: Medicare Other | Admitting: Anesthesiology

## 2017-06-19 ENCOUNTER — Encounter: Payer: Self-pay | Admitting: *Deleted

## 2017-06-19 ENCOUNTER — Encounter
Admission: RE | Disposition: A | Discharge status: Home / Self Care | Payer: Self-pay | Source: Ambulatory Visit | Attending: Unknown Physician Specialty

## 2017-06-19 DIAGNOSIS — Z7951 Long term (current) use of inhaled steroids: Secondary | ICD-10-CM | POA: Insufficient documentation

## 2017-06-19 DIAGNOSIS — K2970 Gastritis, unspecified, without bleeding: Secondary | ICD-10-CM | POA: Diagnosis not present

## 2017-06-19 DIAGNOSIS — K640 First degree hemorrhoids: Secondary | ICD-10-CM | POA: Diagnosis not present

## 2017-06-19 DIAGNOSIS — K449 Diaphragmatic hernia without obstruction or gangrene: Secondary | ICD-10-CM | POA: Diagnosis not present

## 2017-06-19 DIAGNOSIS — Z7982 Long term (current) use of aspirin: Secondary | ICD-10-CM | POA: Insufficient documentation

## 2017-06-19 DIAGNOSIS — Z1211 Encounter for screening for malignant neoplasm of colon: Secondary | ICD-10-CM | POA: Diagnosis not present

## 2017-06-19 DIAGNOSIS — Z793 Long term (current) use of hormonal contraceptives: Secondary | ICD-10-CM | POA: Insufficient documentation

## 2017-06-19 DIAGNOSIS — Z79899 Other long term (current) drug therapy: Secondary | ICD-10-CM | POA: Diagnosis not present

## 2017-06-19 DIAGNOSIS — Q439 Congenital malformation of intestine, unspecified: Secondary | ICD-10-CM | POA: Insufficient documentation

## 2017-06-19 DIAGNOSIS — G4730 Sleep apnea, unspecified: Secondary | ICD-10-CM | POA: Diagnosis not present

## 2017-06-19 DIAGNOSIS — K2950 Unspecified chronic gastritis without bleeding: Secondary | ICD-10-CM | POA: Insufficient documentation

## 2017-06-19 DIAGNOSIS — K6389 Other specified diseases of intestine: Secondary | ICD-10-CM | POA: Insufficient documentation

## 2017-06-19 DIAGNOSIS — J449 Chronic obstructive pulmonary disease, unspecified: Secondary | ICD-10-CM | POA: Diagnosis not present

## 2017-06-19 DIAGNOSIS — Z8371 Family history of colonic polyps: Secondary | ICD-10-CM | POA: Diagnosis not present

## 2017-06-19 DIAGNOSIS — Z9660 Presence of unspecified orthopedic joint implant: Secondary | ICD-10-CM | POA: Diagnosis not present

## 2017-06-19 DIAGNOSIS — F329 Major depressive disorder, single episode, unspecified: Secondary | ICD-10-CM | POA: Diagnosis not present

## 2017-06-19 DIAGNOSIS — R1310 Dysphagia, unspecified: Secondary | ICD-10-CM | POA: Diagnosis present

## 2017-06-19 DIAGNOSIS — K219 Gastro-esophageal reflux disease without esophagitis: Secondary | ICD-10-CM | POA: Insufficient documentation

## 2017-06-19 DIAGNOSIS — K222 Esophageal obstruction: Secondary | ICD-10-CM | POA: Diagnosis not present

## 2017-06-19 DIAGNOSIS — K648 Other hemorrhoids: Secondary | ICD-10-CM | POA: Diagnosis not present

## 2017-06-19 HISTORY — DX: Other specified postprocedural states: Z98.890

## 2017-06-19 HISTORY — DX: Sleep apnea, unspecified: G47.30

## 2017-06-19 HISTORY — DX: Diverticulosis of intestine, part unspecified, without perforation or abscess without bleeding: K57.90

## 2017-06-19 HISTORY — DX: Unspecified hemorrhoids: K64.9

## 2017-06-19 HISTORY — DX: Unspecified osteoarthritis, unspecified site: M19.90

## 2017-06-19 HISTORY — DX: Other specified postprocedural states: R11.2

## 2017-06-19 HISTORY — PX: ESOPHAGOGASTRODUODENOSCOPY (EGD) WITH PROPOFOL: SHX5813

## 2017-06-19 HISTORY — DX: Personal history of urinary calculi: Z87.442

## 2017-06-19 HISTORY — DX: Nausea with vomiting, unspecified: R11.2

## 2017-06-19 HISTORY — PX: COLONOSCOPY WITH PROPOFOL: SHX5780

## 2017-06-19 SURGERY — ESOPHAGOGASTRODUODENOSCOPY (EGD) WITH PROPOFOL
Anesthesia: General

## 2017-06-19 MED ORDER — FENTANYL CITRATE (PF) 100 MCG/2ML IJ SOLN
INTRAMUSCULAR | Status: DC | PRN
  Administered 2017-06-19 (×2): 50 ug via INTRAVENOUS

## 2017-06-19 MED ORDER — VANCOMYCIN HCL 10 G IV SOLR
250.0000 mg | Freq: Once | INTRAVENOUS | Status: AC
Start: 2017-06-19 — End: 2017-06-19
  Administered 2017-06-19: 250 mg via INTRAVENOUS
  Filled 2017-06-19: qty 250

## 2017-06-19 MED ORDER — PROPOFOL 500 MG/50ML IV EMUL
INTRAVENOUS | Status: DC | PRN
  Administered 2017-06-19: 140 ug/kg/min via INTRAVENOUS

## 2017-06-19 MED ORDER — SODIUM CHLORIDE 0.9 % IV SOLN
INTRAVENOUS | Status: DC
  Administered 2017-06-19 (×2): via INTRAVENOUS

## 2017-06-19 MED ORDER — VANCOMYCIN HCL 500 MG IV SOLR
250.0000 mg | INTRAVENOUS | Status: DC
  Filled 2017-06-19: qty 500

## 2017-06-19 MED ORDER — PROPOFOL 10 MG/ML IV BOLUS
INTRAVENOUS | Status: DC | PRN
  Administered 2017-06-19: 100 mg via INTRAVENOUS

## 2017-06-19 MED ORDER — LIDOCAINE 2% (20 MG/ML) 5 ML SYRINGE
INTRAMUSCULAR | Status: DC | PRN
  Administered 2017-06-19: 30 mg via INTRAVENOUS

## 2017-06-19 MED ORDER — FENTANYL CITRATE (PF) 100 MCG/2ML IJ SOLN
INTRAMUSCULAR | Status: AC
  Filled 2017-06-19: qty 2

## 2017-06-19 MED ORDER — GENTAMICIN IN SALINE 1.6-0.9 MG/ML-% IV SOLN
80.0000 mg | Freq: Once | INTRAVENOUS | Status: AC
  Administered 2017-06-19: 80 mg via INTRAVENOUS
  Filled 2017-06-19: qty 50

## 2017-06-19 MED ORDER — SODIUM CHLORIDE 0.9 % IV SOLN: INTRAVENOUS | Status: DC

## 2017-06-19 MED ORDER — GENTAMICIN SULFATE 40 MG/ML IJ SOLN
80.0000 mg | Freq: Once | INTRAMUSCULAR | Status: DC
  Administered 2017-06-19: 80 mg
  Filled 2017-06-19: qty 2

## 2017-06-19 NOTE — Op Note (Signed)
[facility] Patient Name: Maria Keller Procedure Date: 06/19/2017 11:47 AM MRN: [id number] Account #: [id number] Date of Birth: [birth year] Admit Type: Outpatient Age: 67 Room: [facility] ENDO ROOM 3 Gender: Female Note Status: Finalized Procedure:            Upper GI endoscopy Indications:          Dysphagia Providers:            Manya Silvas, MD Referring MD:         Einar Pheasant, MD (Referring MD) Medicines:            Propofol per Anesthesia Complications:        No immediate complications. Procedure:            Pre-Anesthesia Assessment:                       - After reviewing the risks and benefits, the patient                        was deemed in satisfactory condition to undergo the                        procedure.                       After obtaining informed consent, the endoscope was                        passed under direct vision. Throughout the procedure,                        the patient's blood pressure, pulse, and oxygen                        saturations were monitored continuously. The Endoscope                        was introduced through the mouth, and advanced to the                        second part of duodenum. The upper GI endoscopy was                        accomplished without difficulty. The patient tolerated                        the procedure well. Findings:      A mild Schatzki ring was found at the gastroesophageal junction. A       guidewire was placed and the scope was withdrawn. Dilation was performed       with a Savary dilator with mild resistance at 16 mm.      A small hiatal hernia was present.      Diffuse and patchy mild inflammation characterized by erythema and       granularity was found in the gastric antrum. Biopsies were taken with a       cold forceps for histology. Biopsies were taken with a cold forceps for       Helicobacter pylori testing.      The examined duodenum was  normal. Impression:           -  Mild Schatzki ring. Dilated.                       - Small hiatal hernia.                       - Gastritis. Biopsied.                       - Normal examined duodenum. Recommendation:       - Await pathology results. Manya Silvas, MD 06/19/2017 12:05:24 PM This report has been signed electronically. Number of Addenda: 0 Note Initiated On: 06/19/2017 11:47 AM      [facility]

## 2017-06-19 NOTE — Anesthesia Preprocedure Evaluation (Addendum)
Anesthesia Evaluation  Patient identified by MRN, date of birth, ID band Patient awake    Reviewed: Allergy & Precautions, NPO status , Patient's Chart, lab work & pertinent test results  History of Anesthesia Complications (+) PONV and history of anesthetic complications  Airway Mallampati: III  TM Distance: <3 FB     Dental   Pulmonary sleep apnea , COPD,    Pulmonary exam normal        Cardiovascular negative cardio ROS Normal cardiovascular exam     Neuro/Psych  Headaches, PSYCHIATRIC DISORDERS Depression  Neuromuscular disease    GI/Hepatic hiatal hernia, GERD  ,  Endo/Other    Renal/GU stones     Musculoskeletal  (+) Arthritis , Fibromyalgia -  Abdominal Normal abdominal exam  (+)   Peds  Hematology  (+) anemia ,   Anesthesia Other Findings Past Medical History: No date: Anemia     Comment:  iron deficiency and B12 deficiency No date: Arthritis 08/2015: Bronchitis No date: Chronic back pain     Comment:  followed by Dr Primus Bravo No date: Depression No date: Diverticulosis No date: Esophageal stricture     Comment:  requiring dilatation x 2 No date: Fibromyalgia No date: Fibromyalgia No date: Fibromyalgia No 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[place])     Comment:  Followed by Dr Leretha Pol, s/p chemo and XRT No 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[facility]) No date: GERD (gastroesophageal reflux disease) No date: GERD (gastroesophageal reflux disease) No date: Hemorrhoid No date: Hiatal hernia No date: History of kidney stones No date: Hypercholesterolemia No date: IBS (irritable bowel syndrome) No date: IBS (irritable bowel syndrome) No date: Nephrolithiasis     Comment:  followed by Dr Bernardo Heater, s/p stents No date: PONV (postoperative nausea and vomiting) 11/26/2011: Shingles outbreak No date: Sleep apnea  Reproductive/Obstetrics                            Anesthesia Physical Anesthesia  Plan  ASA: III  Anesthesia Plan: General   Post-op Pain Management:    Induction: Intravenous  PONV Risk Score and Plan: TIVA  Airway Management Planned: Nasal Cannula  Additional Equipment:   Intra-op Plan:   Post-operative Plan:   Informed Consent: I have reviewed the patients History and Physical, chart, labs and discussed the procedure including the risks, benefits and alternatives for the proposed anesthesia with the patient or authorized representative who has indicated his/her understanding and acceptance.   Dental advisory given  Plan Discussed with: CRNA and Surgeon  Anesthesia Plan Comments:         Anesthesia Quick Evaluation

## 2017-06-19 NOTE — H&P (Signed)
Primary Care Physician:  Einar Pheasant, MD Primary Gastroenterologist:  Dr. Vira Agar  Pre-Procedure History & Physical: HPI:  Maria Keller is a 67 y.o. female is here for an endoscopy and colonoscopy.for dysphagia and family history of colon polyps in her brother.   Past Medical History:  Diagnosis Date  . Anemia    iron deficiency and B12 deficiency  . Arthritis   . Bronchitis 08/2015  . Chronic back pain    followed by Dr Primus Bravo  . Depression   . Diverticulosis   . Esophageal stricture    requiring dilatation x 2  . Fibromyalgia   . Fibromyalgia   . Fibromyalgia   . Follicular lymphoma ([place])    Followed by Dr Leretha Pol, s/p chemo and XRT  . Follicular lymphoma ([place])   . GERD (gastroesophageal reflux disease)   . GERD (gastroesophageal reflux disease)   . Hemorrhoid   . Hiatal hernia   . History of kidney stones   . Hypercholesterolemia   . IBS (irritable bowel syndrome)   . IBS (irritable bowel syndrome)   . Nephrolithiasis    followed by Dr Bernardo Heater, s/p stents  . PONV (postoperative nausea and vomiting)   . Shingles outbreak 11/26/2011  . Sleep apnea     Past Surgical History:  Procedure Laterality Date  . APPENDECTOMY    . BLADDER SURGERY  1993  . BREAST BIOPSY N/A   . capsule endoscopy    . CHOLECYSTECTOMY  1995  . FRACTURE SURGERY    . history of helicobacter pylori infection    . JOINT REPLACEMENT    . LEFT OOPHORECTOMY    . LEG SURGERY Left   . ORIF ANKLE FRACTURE  2012  . OVARIAN CYST REMOVAL     right  . RECTOCELE REPAIR N/A   . REPLACEMENT TOTAL KNEE Right 03/19/2016  . TONSILECTOMY/ADENOIDECTOMY WITH MYRINGOTOMY  1970  . TONSILLECTOMY N/A   . TUBAL LIGATION  1976  . VAGINAL HYSTERECTOMY  1980's   secondary to bleeding    Prior to Admission medications   Medication Sig Start Date End Date Taking? Authorizing Provider  aspirin EC 81 MG tablet Take by mouth. 03/17/10  Yes [provider]  azelastine (ASTELIN) 0.1 % nasal spray ONER  SPRAY IN EACH NOSTRIL TWICE DAILY AS DIRECTED 01/31/16  Yes Einar Pheasant, MD  calcium carbonate (TUMS - DOSED IN MG ELEMENTAL CALCIUM) 500 MG chewable tablet Chew 1 tablet by mouth as needed.   Yes [provider]  cetirizine (ZYRTEC) 10 MG tablet Take by mouth.   Yes [provider]  cyanocobalamin 1000 MCG tablet Take 1,000 mcg by mouth daily.   Yes [provider]  Diphenhyd-Hydrocort-Nystatin (FIRST-DUKES MOUTHWASH) SUSP Swish and spit 5 mLs tid prn 02/21/17  Yes Einar Pheasant, MD  escitalopram (LEXAPRO) 20 MG tablet TAKE ONE TABLET BY MOUTH EVERY DAY 02/28/17  Yes Einar Pheasant, MD  Fe Fum-FePoly-Vit C-Vit B3 (INTEGRA) 62.5-62.5-40-3 MG CAPS Take 125 mg by mouth.   Yes [provider]  fentaNYL (DURAGESIC) 100 MCG/HR Apply 1 patch to skin every 2 days with fentanyl patch 25 g per hour if tolerated 10/12/15  Yes Mohammed Kindle, MD  fentaNYL (DURAGESIC) 25 MCG/HR patch Apply 1 patch to skin every 2 days with fentanyl patch 100 g per hour if tolerated 10/12/15  Yes Mohammed Kindle, MD  fluticasone [facility]) 50 MCG/ACT nasal spray Place 2 sprays into both nostrils daily. 06/23/14  Yes Einar Pheasant, MD  furosemide (LASIX) 20 MG tablet  Take 1 tablet (20 mg total) by mouth daily as needed. 01/07/17  Yes Einar Pheasant, MD  Magnesium Oxide 400 (240 Mg) MG TABS TAKE ONE TABLET BY MOUTH EVERY DAY 06/06/17  Yes Einar Pheasant, MD  Omega-3 Fatty Acids (FISH OIL) 1360 MG CAPS Take 1 capsule by mouth daily.   Yes [provider]  omeprazole (PRILOSEC) 20 MG capsule TAKE 1 CAPSULE TWICE DAILY 04/08/17  Yes Einar Pheasant, MD  oxyCODONE (ROXICODONE) 5 MG immediate release tablet Limit 1 tab by mouth per day or twice per day if tolerated for breakthrough pain while wearing fentanyl patches 10/12/15  Yes Mohammed Kindle, MD  senna-docusate (SENOKOT-S) 8.6-50 MG per tablet Take by mouth.   Yes [provider]  valACYclovir (VALTREX) 500 MG tablet TAKE ONE TABLET  DAILY 12/13/16  Yes Einar Pheasant, MD  conjugated estrogens (PREMARIN) vaginal cream Place 1 Applicatorful vaginally 3 (three) times a week.    [provider]  estrogens, conjugated, (PREMARIN) 0.625 MG tablet Take 0.625 mg by mouth daily. Take daily for 21 days then do not take for 7 days.    [provider]    Allergies as of 04/18/2017 - Review Complete 01/08/2017  Allergen Reaction Noted  . Penicillins Anaphylaxis and Itching 11/13/2011  . Chloral hydrate Hives 02/15/2012  . Ciprofloxacin Itching 05/11/2014  . Ciprofloxacin hcl Itching 11/13/2011  . Colace [docusate calcium]  02/15/2012  . Methotrexate derivatives Other (See Comments) 02/15/2012  . Oxycodone hcl Itching 05/11/2014  . Oxycontin [oxycodone hcl] Itching 11/13/2011  . Parafon forte dsc [chlorzoxazone] Itching and Other (See Comments) 11/13/2011  . Penicillin g Hives 05/11/2014  . Tape Other (See Comments) 05/11/2014  . Bextra [valdecoxib] Itching 11/13/2011  . Oxycodone Itching 05/11/2014  . Rofecoxib Itching 05/11/2014    Family History  Problem Relation Age of Onset  . Ulcers Mother   . Ulcers Father   . Breast cancer Unknown        great aunt and grandfather  . Lung cancer Unknown        uncle  . Hypertension Maternal Grandmother   . Heart disease Maternal Grandmother        MI 62s  . Diabetes Maternal Grandmother   . Arthritis Maternal Grandmother   . Diabetes Maternal Grandfather     Social History   Socioeconomic History  . Marital status: Married    Spouse name: Not on file  . Number of children: Not on file  . Years of education: Not on file  . Highest education level: Not on file  Occupational History  . Not on file  Social Needs  . Financial resource strain: Not on file  . Food insecurity:    Worry: Not on file    Inability: Not on file  . Transportation needs:    Medical: Not on file    Non-medical: Not on file  Tobacco Use  . Smoking status: Never Smoker  .  Smokeless tobacco: Never Used  Substance and Sexual Activity  . Alcohol use: No    Alcohol/week: 0.0 oz  . Drug use: No  . Sexual activity: Yes  Lifestyle  . Physical activity:    Days per week: Not on file    Minutes per session: Not on file  . Stress: Not on file  Relationships  . Social connections:    Talks on phone: Not on file    Gets together: Not on file    Attends religious service: Not on file  Active member of club or organization: Not on file    Attends meetings of clubs or organizations: Not on file    Relationship status: Not on file  . Intimate partner violence:    Fear of current or ex partner: Not on file    Emotionally abused: Not on file    Physically abused: Not on file    Forced sexual activity: Not on file  Other Topics Concern  . Not on file  Social History Narrative  . Not on file    Review of Systems: See HPI, otherwise negative ROS  Physical Exam: BP 102/64   Pulse 71   Temp 98.2 F (36.8 C) (Tympanic)   Resp 18   Ht 5\' 7"  (1.702 m)   Wt 80.7 kg (178 lb)   SpO2 99%   BMI 27.88 kg/m  General:   Alert,  pleasant and cooperative in NAD Head:  Normocephalic and atraumatic. Neck:  Supple; no masses or thyromegaly. Lungs:  Clear throughout to auscultation.    Heart:  Regular rate and rhythm. Abdomen:  Soft, nontender and nondistended. Normal bowel sounds, without guarding, and without rebound.   Neurologic:  Alert and  oriented x4;  grossly normal neurologically.  Impression/Plan: Maria Keller is here for an endoscopy and colonoscopy to be performed for dysphagia and FH colon polyps in a brother.  Risks, benefits, limitations, and alternatives regarding  endoscopy and colonoscopy have been reviewed with the patient.  Questions have been answered.  All parties agreeable.   Gaylyn Cheers, MD  06/19/2017, 11:43 AM

## 2017-06-19 NOTE — Transfer of Care (Signed)
Immediate Anesthesia Transfer of Care Note  Patient: Maria Keller  Procedure(s) Performed: ESOPHAGOGASTRODUODENOSCOPY (EGD) WITH PROPOFOL (N/A ) COLONOSCOPY WITH PROPOFOL (N/A )  Patient Location: PACU and Endoscopy Unit  Anesthesia Type:General  Level of Consciousness: sedated  Airway & Oxygen Therapy: Patient Spontanous Breathing and Patient connected to nasal cannula oxygen  Post-op Assessment: Report given to RN and Post -op Vital signs reviewed and stable  Post vital signs: Reviewed and stable  Last Vitals:  Vitals Value Taken Time  BP    Temp    Pulse 64 06/19/2017 12:32 PM  Resp 8 06/19/2017 12:32 PM  SpO2 94 % 06/19/2017 12:32 PM  Vitals shown include unvalidated device data.  Last Pain:  Vitals:   06/19/17 0854  TempSrc: Tympanic  PainSc: 0-No pain         Complications: No apparent anesthesia complications

## 2017-06-19 NOTE — Anesthesia Post-op Follow-up Note (Signed)
Anesthesia QCDR form completed.        

## 2017-06-19 NOTE — Anesthesia Postprocedure Evaluation (Signed)
Anesthesia Post Note  Patient: Maria Keller  Procedure(s) Performed: ESOPHAGOGASTRODUODENOSCOPY (EGD) WITH PROPOFOL (N/A ) COLONOSCOPY WITH PROPOFOL (N/A )  Patient location during evaluation: PACU Anesthesia Type: General Level of consciousness: awake and alert and oriented Pain management: pain level controlled Vital Signs Assessment: post-procedure vital signs reviewed and stable Respiratory status: spontaneous breathing Cardiovascular status: blood pressure returned to baseline Anesthetic complications: no     Last Vitals:  Vitals:   06/19/17 1232 06/19/17 1233  BP: (!) 96/51   Pulse: 64 66  Resp: (!) 8 16  Temp: (!) 36.1 C (!) 36 C  SpO2: 94% 96%    Last Pain:  Vitals:   06/19/17 1232  TempSrc: Tympanic  PainSc: Asleep                 Archie Atilano

## 2017-06-19 NOTE — Op Note (Signed)
[facility] Gastroenterology Patient Name: Maria Keller Procedure Date: 06/19/2017 11:46 AM MRN: [id number] Account #: [id number] Date of Birth: [birth year] Admit Type: Outpatient Age: 67 Room: [facility] ENDO ROOM 3 Gender: Female Note Status: Finalized Procedure:            Colonoscopy Indications:          Family history of colonic polyps in a first-degree                        relative Providers:            Manya Silvas, MD Referring MD:         Einar Pheasant, MD (Referring MD) Medicines:            Propofol per Anesthesia Complications:        No immediate complications. Procedure:            Pre-Anesthesia Assessment:                       - After reviewing the risks and benefits, the patient                        was deemed in satisfactory condition to undergo the                        procedure.                       After obtaining informed consent, the colonoscope was                        passed under direct vision. Throughout the procedure,                        the patient's blood pressure, pulse, and oxygen                        saturations were monitored continuously. The                        Colonoscope was introduced through the anus and                        advanced to the the cecum, identified by appendiceal                        orifice and ileocecal valve. The colonoscopy was                        somewhat difficult due to a tortuous colon. Successful                        completion of the procedure was aided by applying                        abdominal pressure. The patient tolerated the procedure                        well. The quality of the bowel preparation was good. Findings:      Internal hemorrhoids were found during endoscopy. The hemorrhoids were  small and Grade I (internal hemorrhoids that do not prolapse).      A diffuse area of moderate melanosis was found in the recto-sigmoid       colon, in the sigmoid  colon, in the descending colon, at the splenic       flexure, in the transverse colon, at the hepatic flexure, in the       ascending colon and in the cecum.      Rectum showed 4 tiny spots due to pressure required to pass the scope.      The exam was otherwise without abnormality. Impression:           - Internal hemorrhoids.                       - Melanosis in the colon.                       - The examination was otherwise normal.                       - No specimens collected. Recommendation:       - Repeat colonoscopy in 5 years for screening purposes. Manya Silvas, MD 06/19/2017 12:31:59 PM This report has been signed electronically. Number of Addenda: 0 Note Initiated On: 06/19/2017 11:46 AM Scope Withdrawal Time: 0 hours 12 minutes 2 seconds  Total Procedure Duration: 0 hours 21 minutes 9 seconds       [facility]

## 2017-06-20 LAB — SURGICAL PATHOLOGY

## 2017-07-12 ENCOUNTER — Ambulatory Visit (INDEPENDENT_AMBULATORY_CARE_PROVIDER_SITE_OTHER): Payer: Medicare Other | Admitting: Internal Medicine

## 2017-07-12 ENCOUNTER — Encounter: Payer: Self-pay | Admitting: Internal Medicine

## 2017-07-12 VITALS — BP 106/68 | HR 74 | Temp 98.2°F | Resp 18 | Wt 183.6 lb

## 2017-07-12 DIAGNOSIS — Z0000 Encounter for general adult medical examination without abnormal findings: Secondary | ICD-10-CM | POA: Diagnosis not present

## 2017-07-12 DIAGNOSIS — C8580 Other specified types of non-Hodgkin lymphoma, unspecified site: Secondary | ICD-10-CM | POA: Diagnosis not present

## 2017-07-12 DIAGNOSIS — K219 Gastro-esophageal reflux disease without esophagitis: Secondary | ICD-10-CM | POA: Diagnosis not present

## 2017-07-12 DIAGNOSIS — M4696 Unspecified inflammatory spondylopathy, lumbar region: Secondary | ICD-10-CM

## 2017-07-12 DIAGNOSIS — R519 Headache, unspecified: Secondary | ICD-10-CM

## 2017-07-12 DIAGNOSIS — G8929 Other chronic pain: Secondary | ICD-10-CM

## 2017-07-12 DIAGNOSIS — R51 Headache: Secondary | ICD-10-CM | POA: Diagnosis not present

## 2017-07-12 DIAGNOSIS — D649 Anemia, unspecified: Secondary | ICD-10-CM | POA: Diagnosis not present

## 2017-07-12 DIAGNOSIS — M545 Low back pain: Secondary | ICD-10-CM

## 2017-07-12 DIAGNOSIS — E7800 Pure hypercholesterolemia, unspecified: Secondary | ICD-10-CM | POA: Diagnosis not present

## 2017-07-12 DIAGNOSIS — M797 Fibromyalgia: Secondary | ICD-10-CM | POA: Diagnosis not present

## 2017-07-12 LAB — CBC WITH DIFFERENTIAL/PLATELET
BASOS ABS: 0.1 10*3/uL (ref 0.0–0.1)
Basophils Relative: 0.8 % (ref 0.0–3.0)
Eosinophils Absolute: 0.2 10*3/uL (ref 0.0–0.7)
Eosinophils Relative: 2.4 % (ref 0.0–5.0)
HCT: 34.2 % — ABNORMAL LOW (ref 36.0–46.0)
Hemoglobin: 11.1 g/dL — ABNORMAL LOW (ref 12.0–15.0)
Lymphocytes Relative: 37.7 % (ref 12.0–46.0)
Lymphs Abs: 2.4 10*3/uL (ref 0.7–4.0)
MCHC: 32.3 g/dL (ref 30.0–36.0)
MCV: 78.2 fl (ref 78.0–100.0)
MONOS PCT: 7.8 % (ref 3.0–12.0)
Monocytes Absolute: 0.5 10*3/uL (ref 0.1–1.0)
NEUTROS ABS: 3.3 10*3/uL (ref 1.4–7.7)
Neutrophils Relative %: 51.3 % (ref 43.0–77.0)
Platelets: 216 10*3/uL (ref 150.0–400.0)
RBC: 4.38 Mil/uL (ref 3.87–5.11)
RDW: 15.8 % — ABNORMAL HIGH (ref 11.5–15.5)
WBC: 6.5 10*3/uL (ref 4.0–10.5)

## 2017-07-12 LAB — HEPATIC FUNCTION PANEL
ALBUMIN: 4.1 g/dL (ref 3.5–5.2)
ALK PHOS: 74 U/L (ref 39–117)
ALT: 18 U/L (ref 0–35)
AST: 23 U/L (ref 0–37)
Bilirubin, Direct: 0.1 mg/dL (ref 0.0–0.3)
TOTAL PROTEIN: 7.1 g/dL (ref 6.0–8.3)
Total Bilirubin: 0.4 mg/dL (ref 0.2–1.2)

## 2017-07-12 LAB — TSH: TSH: 1.32 u[IU]/mL (ref 0.35–4.50)

## 2017-07-12 LAB — BASIC METABOLIC PANEL
BUN: 20 mg/dL (ref 6–23)
CHLORIDE: 107 meq/L (ref 96–112)
CO2: 28 mEq/L (ref 19–32)
CREATININE: 0.86 mg/dL (ref 0.40–1.20)
Calcium: 9.3 mg/dL (ref 8.4–10.5)
GFR: 69.62 mL/min (ref >60.00)
Glucose, Bld: 99 mg/dL (ref 70–99)
POTASSIUM: 4.3 meq/L (ref 3.5–5.1)
Sodium: 141 mEq/L (ref 135–145)

## 2017-07-12 LAB — LIPID PANEL
Cholesterol: 210 mg/dL — ABNORMAL HIGH (ref 0–200)
HDL: 42.8 mg/dL (ref >39.00)
LDL Cholesterol: 145 mg/dL — ABNORMAL HIGH (ref 0–99)
NONHDL: 167.66
Total CHOL/HDL Ratio: 5
Triglycerides: 115 mg/dL (ref 0.0–149.0)
VLDL: 23 mg/dL (ref 0.0–40.0)

## 2017-07-12 NOTE — Progress Notes (Signed)
Patient ID: Maria Keller, female   DOB: Dec 20, 1950, 67 y.o.   MRN: [id number]   Subjective:    Patient ID: Maria Keller, female    DOB: 1951-01-16, 67 y.o.   MRN: [id number]  HPI   Patient with past history of follicular lymphoma, anemia and fibromyalgia.  She comes in today to follow up on these issues as well as for her complete physical exam.  She reports she is doing relatively well.  Headaches are some better.  Saw neurology.  Started on topamax.  Dose be titrated.  Has maxalt if needed.  Recommended f/u in 2 months.  She feels like the topamax is making her a little groggy.  Currently on 75mg  daily now.  Discussed decreasing to 50mg  and notifying neurology.  She saw GI 04/2017.  S/p colonoscopy and EGD.  EGD with gastritis, mild schatzki's ring and small hiatal hernia.  On prilosec.  Discussed adding zantac.  No chest pain.  No sob.  No significant abdominal pain.  Bowels moving.  Overall she feels she is doing relatively well.     Past Medical History:  Diagnosis Date  . Anemia    iron deficiency and B12 deficiency  . Arthritis   . Bronchitis 08/2015  . Chronic back pain    followed by Dr Primus Bravo  . Depression   . Diverticulosis   . Esophageal stricture    requiring dilatation x 2  . Fibromyalgia   . Fibromyalgia   . Fibromyalgia   . Follicular lymphoma ([place])    Followed by Dr Leretha Pol, s/p chemo and XRT  . Follicular lymphoma ([place])   . GERD (gastroesophageal reflux disease)   . GERD (gastroesophageal reflux disease)   . Hemorrhoid   . Hiatal hernia   . History of kidney stones   . Hypercholesterolemia   . IBS (irritable bowel syndrome)   . IBS (irritable bowel syndrome)   . Nephrolithiasis    followed by Dr Bernardo Heater, s/p stents  . PONV (postoperative nausea and vomiting)   . Shingles outbreak 11/26/2011  . Sleep apnea    Past Surgical History:  Procedure Laterality Date  . APPENDECTOMY    . BLADDER SURGERY  1993  . BREAST BIOPSY N/A   . capsule endoscopy    .  CHOLECYSTECTOMY  1995  . COLONOSCOPY WITH PROPOFOL N/A 06/19/2017   Procedure: COLONOSCOPY WITH PROPOFOL;  Surgeon: Manya Silvas, MD;  Location: [facility] ENDOSCOPY;  Service: Endoscopy;  Laterality: N/A;  . ESOPHAGOGASTRODUODENOSCOPY (EGD) WITH PROPOFOL N/A 06/19/2017   Procedure: ESOPHAGOGASTRODUODENOSCOPY (EGD) WITH PROPOFOL;  Surgeon: Manya Silvas, MD;  Location: [facility] ENDOSCOPY;  Service: Endoscopy;  Laterality: N/A;  . FRACTURE SURGERY    . history of helicobacter pylori infection    . JOINT REPLACEMENT    . LEFT OOPHORECTOMY    . LEG SURGERY Left   . ORIF ANKLE FRACTURE  2012  . OVARIAN CYST REMOVAL     right  . RECTOCELE REPAIR N/A   . REPLACEMENT TOTAL KNEE Right 03/19/2016  . TONSILECTOMY/ADENOIDECTOMY WITH MYRINGOTOMY  1970  . TONSILLECTOMY N/A   . TUBAL LIGATION  1976  . VAGINAL HYSTERECTOMY  1980's   secondary to bleeding   Family History  Problem Relation Age of Onset  . Ulcers Mother   . Ulcers Father   . Breast cancer Unknown        great aunt and grandfather  . Lung cancer Unknown        uncle  . Hypertension Maternal  Grandmother   . Heart disease Maternal Grandmother        MI 81s  . Diabetes Maternal Grandmother   . Arthritis Maternal Grandmother   . Diabetes Maternal Grandfather    Social History   Socioeconomic History  . Marital status: Married    Spouse name: Not on file  . Number of children: Not on file  . Years of education: Not on file  . Highest education level: Not on file  Occupational History  . Not on file  Social Needs  . Financial resource strain: Not on file  . Food insecurity:    Worry: Not on file    Inability: Not on file  . Transportation needs:    Medical: Not on file    Non-medical: Not on file  Tobacco Use  . Smoking status: Never Smoker  . Smokeless tobacco: Never Used  Substance and Sexual Activity  . Alcohol use: No    Alcohol/week: 0.0 oz  . Drug use: No  . Sexual activity: Yes  Lifestyle  . Physical  activity:    Days per week: Not on file    Minutes per session: Not on file  . Stress: Not on file  Relationships  . Social connections:    Talks on phone: Not on file    Gets together: Not on file    Attends religious service: Not on file    Active member of club or organization: Not on file    Attends meetings of clubs or organizations: Not on file    Relationship status: Not on file  Other Topics Concern  . Not on file  Social History Narrative  . Not on file    Outpatient Encounter Medications as of 07/12/2017  Medication Sig  . aspirin EC 81 MG tablet Take by mouth.  Marland Kitchen azelastine (ASTELIN) 0.1 % nasal spray ONER SPRAY IN EACH NOSTRIL TWICE DAILY AS DIRECTED  . calcium carbonate (TUMS - DOSED IN MG ELEMENTAL CALCIUM) 500 MG chewable tablet Chew 1 tablet by mouth as needed.  . cetirizine (ZYRTEC) 10 MG tablet Take by mouth.  . conjugated estrogens (PREMARIN) vaginal cream Place 1 Applicatorful vaginally 3 (three) times a week.  . cyanocobalamin 1000 MCG tablet Take 1,000 mcg by mouth daily.  . Diphenhyd-Hydrocort-Nystatin (FIRST-DUKES MOUTHWASH) SUSP Swish and spit 5 mLs tid prn  . escitalopram (LEXAPRO) 20 MG tablet TAKE ONE TABLET BY MOUTH EVERY DAY  . estrogens, conjugated, (PREMARIN) 0.625 MG tablet Take 0.625 mg by mouth daily. Take daily for 21 days then do not take for 7 days.  . Fe Fum-FePoly-Vit C-Vit B3 (INTEGRA) 62.5-62.5-40-3 MG CAPS Take 125 mg by mouth.  . fentaNYL (DURAGESIC) 100 MCG/HR Apply 1 patch to skin every 2 days with fentanyl patch 25 g per hour if tolerated  . fentaNYL (DURAGESIC) 25 MCG/HR patch Apply 1 patch to skin every 2 days with fentanyl patch 100 g per hour if tolerated  . fluticasone (FLONASE) 50 MCG/ACT nasal spray Place 2 sprays into both nostrils daily.  . furosemide (LASIX) 20 MG tablet Take 1 tablet (20 mg total) by mouth daily as needed.  . Magnesium Oxide 400 (240 Mg) MG TABS TAKE ONE TABLET BY MOUTH EVERY DAY  . Omega-3 Fatty Acids (FISH  OIL) 1360 MG CAPS Take 1 capsule by mouth daily.  Marland Kitchen omeprazole (PRILOSEC) 20 MG capsule TAKE 1 CAPSULE TWICE DAILY  . oxyCODONE (ROXICODONE) 5 MG immediate release tablet Limit 1 tab by mouth per day or twice per  day if tolerated for breakthrough pain while wearing fentanyl patches  . senna-docusate (SENOKOT-S) 8.6-50 MG per tablet Take by mouth.  . valACYclovir (VALTREX) 500 MG tablet TAKE ONE TABLET DAILY   No facility-administered encounter medications on file as of 07/12/2017.     Review of Systems  Constitutional: Negative for appetite change and unexpected weight change.  HENT: Negative for congestion and sinus pressure.   Eyes: Negative for pain and visual disturbance.  Respiratory: Negative for cough, chest tightness and shortness of breath.   Cardiovascular: Negative for chest pain, palpitations and leg swelling.  Gastrointestinal: Negative for abdominal pain, diarrhea, nausea and vomiting.       Some acid reflux.   Genitourinary: Negative for difficulty urinating and dysuria.  Musculoskeletal: Negative for joint swelling.       Chronic pain.  Followed by pain clinic.    Skin: Negative for color change and rash.  Neurological: Negative for dizziness and light-headedness.       Headaches have improved.    Hematological: Negative for adenopathy. Does not bruise/bleed easily.  Psychiatric/Behavioral: Negative for agitation and dysphoric mood.       Objective:     Blood pressure rechecked by me:  112/76  Physical Exam  Constitutional: She is oriented to person, place, and time. She appears well-developed and well-nourished. No distress.  HENT:  Nose: Nose normal.  Mouth/Throat: Oropharynx is clear and moist.  Eyes: Right eye exhibits no discharge. Left eye exhibits no discharge. No scleral icterus.  Neck: Neck supple. No thyromegaly present.  Cardiovascular: Normal rate and regular rhythm.  Pulmonary/Chest: Breath sounds normal. No accessory muscle usage. No tachypnea. No  respiratory distress. She has no decreased breath sounds. She has no wheezes. She has no rhonchi. Right breast exhibits no inverted nipple, no mass, no nipple discharge and no tenderness (no axillary adenopathy). Left breast exhibits no inverted nipple, no mass, no nipple discharge and no tenderness (no axilarry adenopathy).  Abdominal: Soft. Bowel sounds are normal. There is no tenderness.  Musculoskeletal: She exhibits no edema or tenderness.  Lymphadenopathy:    She has no cervical adenopathy.  Neurological: She is alert and oriented to person, place, and time.  Skin: No rash noted. No erythema.  Psychiatric: She has a normal mood and affect. Her behavior is normal.    BP 106/68 (BP Location: Left Arm, Patient Position: Sitting, Cuff Size: Normal)   Pulse 74   Temp 98.2 F (36.8 C) (Oral)   Resp 18   Wt 183 lb 9.6 oz (83.3 kg)   SpO2 98%   BMI 28.76 kg/m  Wt Readings from Last 3 Encounters:  07/12/17 183 lb 9.6 oz (83.3 kg)  06/19/17 178 lb (80.7 kg)  04/30/17 178 lb (80.7 kg)     Lab Results  Component Value Date   WBC 6.5 07/12/2017   HGB 11.1 (L) 07/12/2017   HCT 34.2 (L) 07/12/2017   PLT 216.0 07/12/2017   GLUCOSE 99 07/12/2017   CHOL 210 (H) 07/12/2017   TRIG 115.0 07/12/2017   HDL 42.80 07/12/2017   LDLDIRECT 164.2 02/11/2013   LDLCALC 145 (H) 07/12/2017   ALT 18 07/12/2017   AST 23 07/12/2017   NA 141 07/12/2017   K 4.3 07/12/2017   CL 107 07/12/2017   CREATININE 0.86 07/12/2017   BUN 20 07/12/2017   CO2 28 07/12/2017   TSH 1.32 07/12/2017   HGBA1C 5.7 10/27/2014    Mr Brain W Wo Contrast  Result Date: 05/14/2017 CLINICAL DATA:  Headaches for 2 months.  Non intractable headache. EXAM: MRI HEAD WITHOUT AND WITH CONTRAST TECHNIQUE: Multiplanar, multiecho pulse sequences of the brain and surrounding structures were obtained without and with intravenous contrast. CONTRAST:  46mL MULTIHANCE GADOBENATE DIMEGLUMINE 529 MG/ML IV SOLN COMPARISON:  None. FINDINGS:  Brain: Periventricular and subcortical T2 hyperintensities bilaterally are mildly advanced for age. No acute infarct, hemorrhage, or mass lesion is present. Ventricles are of normal size. Insert pass fluid The internal auditory canals are within normal limits bilaterally. The brainstem and cerebellum are normal. Vascular: Flow is present in the major intracranial arteries. Skull and upper cervical spine: The skull base is within normal limits. Craniocervical junction is normal. The upper cervical spine is within normal limits. Sinuses/Orbits: Paranasal sinuses and mastoid air cells are clear. Bilateral lens replacements are present. Globes and orbits are within limits. Other: None. IMPRESSION: 1. Periventricular and subcortical T2 hyperintensities bilaterally are mildly advanced for age. The finding is nonspecific but can be seen in the setting of chronic microvascular ischemia, a demyelinating process such as multiple sclerosis, vasculitis, complicated migraine headaches, or as the sequelae of a prior infectious or inflammatory process. 2. No acute or focal abnormality to explain headaches otherwise. Electronically Signed   By: San Morelle M.D.   On: 05/14/2017 10:13       Assessment & Plan:   Problem List Items Addressed This Visit    Anemia - Primary    Has a history of iron deficiency and B12 deficiency.  Recheck cbc.  Just had colonoscopy and EGD as outlined.        Relevant Orders   CBC with Differential/Platelet (Completed)   Chronic back pain    Followed by pain clinic.        Fibromyalgia    Has a history of chronic pain.  Followed by pain clinic.        GERD (gastroesophageal reflux disease)    Just had EGD.  On prilosec.  Add zantac for some break through reflux.  Follow.        Headache    Just evaluated by neurology.  On topamax.  Making her groggy.  Discussed decreasing the dose to 50mg  q day.  She plans to contact neurology.  May need to decrease further.  Has maxalt  to use prn.        Health care maintenance    Physical today 07/12/17.  S/p hysterectomy.  Mammogram 08/01/16 - Birads II.  Schedule f/u mammogram.  Colonoscopy 04/2017.  Recommended f/u colonoscopy in 5 years.        Hypercholesterolemia    Not on cholesterol medication.  Low cholesterol diet and exercise.  Follow lipid panel and liver function tests.        Relevant Orders   Hepatic function panel (Completed)   Lipid panel (Completed)   TSH (Completed)   Basic metabolic panel (Completed)   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[facility])    Followed by pain clinic.  Stable.       Lymphoma ([place])    Followed by Dr Leretha Pol.  Has f/u planned in 01/2018.           Einar Pheasant, MD

## 2017-07-12 NOTE — Patient Instructions (Addendum)
Add zantac (ranitidine) 150mg  - take before bed.    Call with update over the next several weeks.

## 2017-07-12 NOTE — Assessment & Plan Note (Signed)
Physical today 07/12/17.  S/p hysterectomy.  Mammogram 08/01/16 - Birads II.  Schedule f/u mammogram.  Colonoscopy 04/2017.  Recommended f/u colonoscopy in 5 years.

## 2017-07-15 ENCOUNTER — Encounter: Payer: Self-pay | Admitting: Internal Medicine

## 2017-07-15 DIAGNOSIS — C8590 Non-Hodgkin lymphoma, unspecified, unspecified site: Secondary | ICD-10-CM | POA: Diagnosis not present

## 2017-07-15 DIAGNOSIS — M4696 Unspecified inflammatory spondylopathy, lumbar region: Secondary | ICD-10-CM | POA: Insufficient documentation

## 2017-07-15 DIAGNOSIS — Z96651 Presence of right artificial knee joint: Secondary | ICD-10-CM | POA: Diagnosis not present

## 2017-07-15 DIAGNOSIS — B0229 Other postherpetic nervous system involvement: Secondary | ICD-10-CM | POA: Diagnosis not present

## 2017-07-15 DIAGNOSIS — Z5181 Encounter for therapeutic drug level monitoring: Secondary | ICD-10-CM | POA: Diagnosis not present

## 2017-07-15 NOTE — Assessment & Plan Note (Signed)
Followed by pain clinic.  Stable.  

## 2017-07-15 NOTE — Assessment & Plan Note (Signed)
Has a history of chronic pain.  Followed by pain clinic.

## 2017-07-15 NOTE — Assessment & Plan Note (Signed)
Just had EGD.  On prilosec.  Add zantac for some break through reflux.  Follow.

## 2017-07-15 NOTE — Assessment & Plan Note (Signed)
Has a history of iron deficiency and B12 deficiency.  Recheck cbc.  Just had colonoscopy and EGD as outlined.

## 2017-07-15 NOTE — Assessment & Plan Note (Signed)
Just evaluated by neurology.  On topamax.  Making her groggy.  Discussed decreasing the dose to 50mg  q day.  She plans to contact neurology.  May need to decrease further.  Has maxalt to use prn.

## 2017-07-15 NOTE — Assessment & Plan Note (Signed)
Not on cholesterol medication.  Low cholesterol diet and exercise.  Follow lipid panel and liver function tests.

## 2017-07-15 NOTE — Assessment & Plan Note (Signed)
Followed by Dr Leretha Pol.  Has f/u planned in 01/2018.

## 2017-07-15 NOTE — Assessment & Plan Note (Signed)
Followed by pain clinic.  

## 2017-07-25 ENCOUNTER — Telehealth: Payer: Self-pay | Admitting: Radiology

## 2017-07-25 NOTE — Telephone Encounter (Signed)
Pt coming in for labs Monday, please place future orders. Thank you 

## 2017-07-28 ENCOUNTER — Other Ambulatory Visit: Payer: Self-pay | Admitting: Internal Medicine

## 2017-07-28 DIAGNOSIS — D649 Anemia, unspecified: Secondary | ICD-10-CM

## 2017-07-28 NOTE — Telephone Encounter (Signed)
Orders placed for f/u labs.  

## 2017-07-28 NOTE — Progress Notes (Signed)
Orders placed for f/u labs.  

## 2017-07-29 ENCOUNTER — Other Ambulatory Visit (INDEPENDENT_AMBULATORY_CARE_PROVIDER_SITE_OTHER): Payer: Medicare Other

## 2017-07-29 DIAGNOSIS — D649 Anemia, unspecified: Secondary | ICD-10-CM

## 2017-07-29 LAB — CBC WITH DIFFERENTIAL/PLATELET
BASOS ABS: 0 10*3/uL (ref 0.0–0.1)
BASOS PCT: 0.5 % (ref 0.0–3.0)
Eosinophils Absolute: 0.1 10*3/uL (ref 0.0–0.7)
Eosinophils Relative: 1.7 % (ref 0.0–5.0)
HEMATOCRIT: 35.4 % — AB (ref 36.0–46.0)
HEMOGLOBIN: 11.6 g/dL — AB (ref 12.0–15.0)
LYMPHS PCT: 51.7 % — AB (ref 12.0–46.0)
Lymphs Abs: 4.2 10*3/uL — ABNORMAL HIGH (ref 0.7–4.0)
MCHC: 32.8 g/dL (ref 30.0–36.0)
MCV: 77.2 fl — ABNORMAL LOW (ref 78.0–100.0)
Monocytes Absolute: 0.5 10*3/uL (ref 0.1–1.0)
Monocytes Relative: 6.8 % (ref 3.0–12.0)
Neutro Abs: 3.2 10*3/uL (ref 1.4–7.7)
Neutrophils Relative %: 39.3 % — ABNORMAL LOW (ref 43.0–77.0)
Platelets: 216 10*3/uL (ref 150.0–400.0)
RBC: 4.59 Mil/uL (ref 3.87–5.11)
RDW: 15.6 % — AB (ref 11.5–15.5)
WBC: 8 10*3/uL (ref 4.0–10.5)

## 2017-07-29 LAB — IBC PANEL
Iron: 37 ug/dL — ABNORMAL LOW (ref 42–145)
SATURATION RATIOS: 7.4 % — AB (ref 20.0–50.0)
TRANSFERRIN: 359 mg/dL (ref 212.0–360.0)

## 2017-07-29 LAB — FERRITIN: FERRITIN: 8.6 ng/mL — AB (ref 10.0–291.0)

## 2017-07-30 ENCOUNTER — Telehealth: Payer: Self-pay

## 2017-07-30 NOTE — Telephone Encounter (Signed)
Copied from [place] [id number]. Topic: Inquiry >> Jul 30, 2017  1:14 PM Pricilla Handler wrote: Reason for CRM: Patient called requesting a new prescription for Fe Fum-FePoly-Vit C-Vit B3 (INTEGRA) 62.5-62.5-40-3 MG CAPS. Patient would like for Dr. Bary Leriche assistant to call her today at [phone].        Thank You!!!

## 2017-07-30 NOTE — Telephone Encounter (Signed)
See result note.  I do want her to stay on the [place].  I am ok to refill if this is the question.

## 2017-07-30 NOTE — Telephone Encounter (Signed)
Result note stated that you wanted her to start [place]. Her rx was originally prescribed by another provider. OK to send in?

## 2017-07-31 ENCOUNTER — Other Ambulatory Visit: Payer: Self-pay

## 2017-07-31 MED ORDER — INTEGRA 62.5-62.5-40-3 MG PO CAPS: 125.0000 mg | ORAL_CAPSULE | Freq: Every day | ORAL | 2 refills | Status: DC

## 2017-07-31 NOTE — Telephone Encounter (Signed)
Pt aware rx sent in. 

## 2017-08-07 DIAGNOSIS — Z1231 Encounter for screening mammogram for malignant neoplasm of breast: Secondary | ICD-10-CM | POA: Diagnosis not present

## 2017-08-07 LAB — HM MAMMOGRAPHY

## 2017-08-12 DIAGNOSIS — Z96651 Presence of right artificial knee joint: Secondary | ICD-10-CM | POA: Diagnosis not present

## 2017-08-12 DIAGNOSIS — B0229 Other postherpetic nervous system involvement: Secondary | ICD-10-CM | POA: Diagnosis not present

## 2017-08-12 DIAGNOSIS — Z5181 Encounter for therapeutic drug level monitoring: Secondary | ICD-10-CM | POA: Diagnosis not present

## 2017-08-12 DIAGNOSIS — C8590 Non-Hodgkin lymphoma, unspecified, unspecified site: Secondary | ICD-10-CM | POA: Diagnosis not present

## 2017-08-16 DIAGNOSIS — Z8619 Personal history of other infectious and parasitic diseases: Secondary | ICD-10-CM | POA: Insufficient documentation

## 2017-08-16 DIAGNOSIS — G43119 Migraine with aura, intractable, without status migrainosus: Secondary | ICD-10-CM | POA: Diagnosis not present

## 2017-09-02 ENCOUNTER — Other Ambulatory Visit: Payer: Self-pay | Admitting: Internal Medicine

## 2017-09-16 DIAGNOSIS — Z96651 Presence of right artificial knee joint: Secondary | ICD-10-CM | POA: Diagnosis not present

## 2017-09-16 DIAGNOSIS — Z5181 Encounter for therapeutic drug level monitoring: Secondary | ICD-10-CM | POA: Diagnosis not present

## 2017-09-16 DIAGNOSIS — C8590 Non-Hodgkin lymphoma, unspecified, unspecified site: Secondary | ICD-10-CM | POA: Diagnosis not present

## 2017-09-16 DIAGNOSIS — B0229 Other postherpetic nervous system involvement: Secondary | ICD-10-CM | POA: Diagnosis not present

## 2017-10-14 ENCOUNTER — Other Ambulatory Visit: Payer: Self-pay | Admitting: Internal Medicine

## 2017-10-14 DIAGNOSIS — Z96651 Presence of right artificial knee joint: Secondary | ICD-10-CM | POA: Diagnosis not present

## 2017-10-14 DIAGNOSIS — Z5181 Encounter for therapeutic drug level monitoring: Secondary | ICD-10-CM | POA: Diagnosis not present

## 2017-10-14 DIAGNOSIS — C8590 Non-Hodgkin lymphoma, unspecified, unspecified site: Secondary | ICD-10-CM | POA: Diagnosis not present

## 2017-10-14 DIAGNOSIS — B0229 Other postherpetic nervous system involvement: Secondary | ICD-10-CM | POA: Diagnosis not present

## 2017-11-11 ENCOUNTER — Ambulatory Visit: Payer: Medicare Other | Admitting: Internal Medicine

## 2017-11-13 DIAGNOSIS — C8590 Non-Hodgkin lymphoma, unspecified, unspecified site: Secondary | ICD-10-CM | POA: Diagnosis not present

## 2017-11-13 DIAGNOSIS — Z96651 Presence of right artificial knee joint: Secondary | ICD-10-CM | POA: Diagnosis not present

## 2017-11-13 DIAGNOSIS — Z5181 Encounter for therapeutic drug level monitoring: Secondary | ICD-10-CM | POA: Diagnosis not present

## 2017-11-13 DIAGNOSIS — B0229 Other postherpetic nervous system involvement: Secondary | ICD-10-CM | POA: Diagnosis not present

## 2017-11-26 ENCOUNTER — Encounter: Payer: Self-pay | Admitting: Internal Medicine

## 2017-11-26 ENCOUNTER — Ambulatory Visit (INDEPENDENT_AMBULATORY_CARE_PROVIDER_SITE_OTHER): Payer: Medicare Other | Admitting: Internal Medicine

## 2017-11-26 VITALS — BP 122/76 | HR 100 | Temp 98.0°F | Resp 18 | Wt 168.0 lb

## 2017-11-26 DIAGNOSIS — C8580 Other specified types of non-Hodgkin lymphoma, unspecified site: Secondary | ICD-10-CM | POA: Diagnosis not present

## 2017-11-26 DIAGNOSIS — E7800 Pure hypercholesterolemia, unspecified: Secondary | ICD-10-CM

## 2017-11-26 DIAGNOSIS — K219 Gastro-esophageal reflux disease without esophagitis: Secondary | ICD-10-CM | POA: Diagnosis not present

## 2017-11-26 DIAGNOSIS — R5383 Other fatigue: Secondary | ICD-10-CM

## 2017-11-26 DIAGNOSIS — R519 Headache, unspecified: Secondary | ICD-10-CM

## 2017-11-26 DIAGNOSIS — D649 Anemia, unspecified: Secondary | ICD-10-CM

## 2017-11-26 DIAGNOSIS — R51 Headache: Secondary | ICD-10-CM

## 2017-11-26 LAB — HEPATIC FUNCTION PANEL
ALT: 11 U/L (ref 0–35)
AST: 15 U/L (ref 0–37)
Albumin: 4.8 g/dL (ref 3.5–5.2)
Alkaline Phosphatase: 61 U/L (ref 39–117)
BILIRUBIN DIRECT: 0.1 mg/dL (ref 0.0–0.3)
Total Bilirubin: 0.4 mg/dL (ref 0.2–1.2)
Total Protein: 7.9 g/dL (ref 6.0–8.3)

## 2017-11-26 LAB — BASIC METABOLIC PANEL
BUN: 14 mg/dL (ref 6–23)
CHLORIDE: 100 meq/L (ref 96–112)
CO2: 31 meq/L (ref 19–32)
Calcium: 9.9 mg/dL (ref 8.4–10.5)
Creatinine, Ser: 0.97 mg/dL (ref 0.40–1.20)
GFR: 60.52 mL/min (ref >60.00)
Glucose, Bld: 120 mg/dL — ABNORMAL HIGH (ref 70–99)
Potassium: 3.8 mEq/L (ref 3.5–5.1)
SODIUM: 138 meq/L (ref 135–145)

## 2017-11-26 LAB — CBC WITH DIFFERENTIAL/PLATELET
BASOS ABS: 0 10*3/uL (ref 0.0–0.1)
Basophils Relative: 0.4 % (ref 0.0–3.0)
EOS ABS: 0 10*3/uL (ref 0.0–0.7)
EOS PCT: 0.2 % (ref 0.0–5.0)
HCT: 39.9 % (ref 36.0–46.0)
HEMOGLOBIN: 13.2 g/dL (ref 12.0–15.0)
Lymphocytes Relative: 21.2 % (ref 12.0–46.0)
Lymphs Abs: 2 10*3/uL (ref 0.7–4.0)
MCHC: 33.1 g/dL (ref 30.0–36.0)
MCV: 79.4 fl (ref 78.0–100.0)
Monocytes Absolute: 0.5 10*3/uL (ref 0.1–1.0)
Monocytes Relative: 5.4 % (ref 3.0–12.0)
Neutro Abs: 6.9 10*3/uL (ref 1.4–7.7)
Neutrophils Relative %: 72.8 % (ref 43.0–77.0)
Platelets: 256 10*3/uL (ref 150.0–400.0)
RBC: 5.02 Mil/uL (ref 3.87–5.11)
RDW: 15.8 % — ABNORMAL HIGH (ref 11.5–15.5)
WBC: 9.4 10*3/uL (ref 4.0–10.5)

## 2017-11-26 LAB — FERRITIN: Ferritin: 14.1 ng/mL (ref 10.0–291.0)

## 2017-11-26 LAB — LIPID PANEL
Cholesterol: 265 mg/dL — ABNORMAL HIGH (ref 0–200)
HDL: 54.9 mg/dL (ref >39.00)
LDL CALC: 186 mg/dL — AB (ref 0–99)
NONHDL: 209.66
TRIGLYCERIDES: 120 mg/dL (ref 0.0–149.0)
Total CHOL/HDL Ratio: 5
VLDL: 24 mg/dL (ref 0.0–40.0)

## 2017-11-26 LAB — TSH: TSH: 0.54 u[IU]/mL (ref 0.35–4.50)

## 2017-11-26 NOTE — Progress Notes (Signed)
Patient ID: Maria Keller, female   DOB: [birth year], 67 y.o.   MRN: [id number]   Subjective:    Patient ID: Maria Keller, female    DOB: 25-Nov-1950, 67 y.o.   MRN: [id number]  HPI  Patient here for a scheduled follow up.  She reports some increased fatigue.  Seeing neurology for her headaches.  Started emgality.  Off now.  Reports increased constipation.  Taking stool softener.  Used an enema.  Bowels are moving.  Some increased fatigue and decreased energy.  Decreased weight.  Has been trying to watch her diet.  No chest pain.  Breathing stable.  No increased cough or congestion.  No acid reflux.  No abdominal pain.  Bowels moving.  Off lexapro for 7-8 months.  Does not feel needs.  No increased stress, anxiety or depression.  Reports decreased energy - main complaint.     Past Medical History:  Diagnosis Date  . Anemia    iron deficiency and B12 deficiency  . Arthritis   . Bronchitis 08/2015  . Chronic back pain    followed by Dr Primus Bravo  . Depression   . Diverticulosis   . Esophageal stricture    requiring dilatation x 2  . Fibromyalgia   . Fibromyalgia   . Fibromyalgia   . Follicular lymphoma ([place])    Followed by Dr Leretha Pol, s/p chemo and XRT  . Follicular lymphoma ([place])   . GERD (gastroesophageal reflux disease)   . GERD (gastroesophageal reflux disease)   . Hemorrhoid   . Hiatal hernia   . History of kidney stones   . Hypercholesterolemia   . IBS (irritable bowel syndrome)   . IBS (irritable bowel syndrome)   . Nephrolithiasis    followed by Dr Bernardo Heater, s/p stents  . PONV (postoperative nausea and vomiting)   . Shingles outbreak 11/26/2011  . Sleep apnea    Past Surgical History:  Procedure Laterality Date  . APPENDECTOMY    . BLADDER SURGERY  1993  . BREAST BIOPSY N/A   . capsule endoscopy    . CHOLECYSTECTOMY  1995  . COLONOSCOPY WITH PROPOFOL N/A 06/19/2017   Procedure: COLONOSCOPY WITH PROPOFOL;  Surgeon: Manya Silvas, MD;  Location: [facility] ENDOSCOPY;   Service: Endoscopy;  Laterality: N/A;  . ESOPHAGOGASTRODUODENOSCOPY (EGD) WITH PROPOFOL N/A 06/19/2017   Procedure: ESOPHAGOGASTRODUODENOSCOPY (EGD) WITH PROPOFOL;  Surgeon: Manya Silvas, MD;  Location: [facility] ENDOSCOPY;  Service: Endoscopy;  Laterality: N/A;  . FRACTURE SURGERY    . history of helicobacter pylori infection    . JOINT REPLACEMENT    . LEFT OOPHORECTOMY    . LEG SURGERY Left   . ORIF ANKLE FRACTURE  2012  . OVARIAN CYST REMOVAL     right  . RECTOCELE REPAIR N/A   . REPLACEMENT TOTAL KNEE Right 03/19/2016  . TONSILECTOMY/ADENOIDECTOMY WITH MYRINGOTOMY  1970  . TONSILLECTOMY N/A   . TUBAL LIGATION  1976  . VAGINAL HYSTERECTOMY  1980's   secondary to bleeding   Family History  Problem Relation Age of Onset  . Ulcers Mother   . Ulcers Father   . Breast cancer Unknown        great aunt and grandfather  . Lung cancer Unknown        uncle  . Hypertension Maternal Grandmother   . Heart disease Maternal Grandmother        MI 83s  . Diabetes Maternal Grandmother   . Arthritis Maternal Grandmother   . Diabetes Maternal Grandfather  Social History   Socioeconomic History  . Marital status: Married    Spouse name: Not on file  . Number of children: Not on file  . Years of education: Not on file  . Highest education level: Not on file  Occupational History  . Not on file  Social Needs  . Financial resource strain: Not on file  . Food insecurity:    Worry: Not on file    Inability: Not on file  . Transportation needs:    Medical: Not on file    Non-medical: Not on file  Tobacco Use  . Smoking status: Never Smoker  . Smokeless tobacco: Never Used  Substance and Sexual Activity  . Alcohol use: No    Alcohol/week: 0.0 standard drinks  . Drug use: No  . Sexual activity: Yes  Lifestyle  . Physical activity:    Days per week: Not on file    Minutes per session: Not on file  . Stress: Not on file  Relationships  . Social connections:    Talks on phone:  Not on file    Gets together: Not on file    Attends religious service: Not on file    Active member of club or organization: Not on file    Attends meetings of clubs or organizations: Not on file    Relationship status: Not on file  Other Topics Concern  . Not on file  Social History Narrative  . Not on file    Outpatient Encounter Medications as of 11/26/2017  Medication Sig  . topiramate (TOPAMAX) 50 MG tablet Take 1 tablet by mouth daily.  Marland Kitchen aspirin EC 81 MG tablet Take by mouth.  Marland Kitchen azelastine (ASTELIN) 0.1 % nasal spray ONER SPRAY IN EACH NOSTRIL TWICE DAILY AS DIRECTED  . calcium carbonate (TUMS - DOSED IN MG ELEMENTAL CALCIUM) 500 MG chewable tablet Chew 1 tablet by mouth as needed.  . cetirizine (ZYRTEC) 10 MG tablet Take by mouth.  . cyanocobalamin 1000 MCG tablet Take 1,000 mcg by mouth daily.  . Diphenhyd-Hydrocort-Nystatin (FIRST-DUKES MOUTHWASH) SUSP Swish and spit 5 mLs tid prn  . estrogens, conjugated, (PREMARIN) 0.625 MG tablet Take 0.625 mg by mouth daily. Take daily for 21 days then do not take for 7 days.  . fentaNYL (DURAGESIC) 100 MCG/HR Apply 1 patch to skin every 2 days with fentanyl patch 25 g per hour if tolerated  . fentaNYL (DURAGESIC) 25 MCG/HR patch Apply 1 patch to skin every 2 days with fentanyl patch 100 g per hour if tolerated  . fluticasone (FLONASE) 50 MCG/ACT nasal spray Place 2 sprays into both nostrils daily.  . furosemide (LASIX) 20 MG tablet Take 1 tablet (20 mg total) by mouth daily as needed.  . magnesium oxide (MAG-OX) 400 (241.3 Mg) MG tablet TAKE ONE TABLET BY MOUTH EVERY DAY  . Omega-3 Fatty Acids (FISH OIL) 1360 MG CAPS Take 1 capsule by mouth daily.  Marland Kitchen omeprazole (PRILOSEC) 20 MG capsule TAKE 1 CAPSULE TWICE DAILY  . oxyCODONE (ROXICODONE) 5 MG immediate release tablet Limit 1 tab by mouth per day or twice per day if tolerated for breakthrough pain while wearing fentanyl patches  . senna-docusate (SENOKOT-S) 8.6-50 MG per tablet Take by  mouth.  . valACYclovir (VALTREX) 500 MG tablet TAKE ONE TABLET EVERY DAY  . [DISCONTINUED] conjugated estrogens (PREMARIN) vaginal cream Place 1 Applicatorful vaginally 3 (three) times a week.  . [DISCONTINUED] escitalopram (LEXAPRO) 20 MG tablet TAKE ONE TABLET BY MOUTH EVERY DAY  . [  DISCONTINUED] Fe Fum-FePoly-Vit C-Vit B3 (INTEGRA) 62.5-62.5-40-3 MG CAPS Take 125 mg by mouth daily.   No facility-administered encounter medications on file as of 11/26/2017.     Review of Systems  Constitutional: Positive for fatigue.       Decreased weight as outlined.    HENT: Negative for congestion and sinus pressure.   Respiratory: Negative for cough, chest tightness and shortness of breath.   Cardiovascular: Negative for chest pain, palpitations and leg swelling.  Gastrointestinal: Negative for abdominal pain, diarrhea, nausea and vomiting.  Genitourinary: Negative for difficulty urinating and dysuria.  Musculoskeletal: Negative for joint swelling and myalgias.  Skin: Negative for color change and rash.  Neurological: Negative for dizziness and light-headedness.  Psychiatric/Behavioral: Negative for agitation and dysphoric mood.       Objective:    Physical Exam  Constitutional: She appears well-developed and well-nourished. No distress.  HENT:  Nose: Nose normal.  Mouth/Throat: Oropharynx is clear and moist.  Neck: Neck supple. No thyromegaly present.  Cardiovascular: Normal rate and regular rhythm.  Pulmonary/Chest: Breath sounds normal. No respiratory distress. She has no wheezes.  Abdominal: Soft. Bowel sounds are normal. There is no tenderness.  Musculoskeletal: She exhibits no edema or tenderness.  Lymphadenopathy:    She has no cervical adenopathy.  Skin: No rash noted. No erythema.  Psychiatric: She has a normal mood and affect. Her behavior is normal.    BP 122/76 (BP Location: Left Arm, Patient Position: Sitting, Cuff Size: Normal)   Pulse 100   Temp 98 F (36.7 C) (Oral)    Resp 18   Wt 168 lb (76.2 kg)   SpO2 98%   BMI 26.31 kg/m  Wt Readings from Last 3 Encounters:  11/26/17 168 lb (76.2 kg)  07/12/17 183 lb 9.6 oz (83.3 kg)  06/19/17 178 lb (80.7 kg)     Lab Results  Component Value Date   WBC 9.4 11/26/2017   HGB 13.2 11/26/2017   HCT 39.9 11/26/2017   PLT 256.0 11/26/2017   GLUCOSE 120 (H) 11/26/2017   CHOL 265 (H) 11/26/2017   TRIG 120.0 11/26/2017   HDL 54.90 11/26/2017   LDLDIRECT 164.2 02/11/2013   LDLCALC 186 (H) 11/26/2017   ALT 11 11/26/2017   AST 15 11/26/2017   NA 138 11/26/2017   K 3.8 11/26/2017   CL 100 11/26/2017   CREATININE 0.97 11/26/2017   BUN 14 11/26/2017   CO2 31 11/26/2017   TSH 0.54 11/26/2017   HGBA1C 5.7 10/27/2014    Mr Brain W Wo Contrast  Result Date: 05/14/2017 CLINICAL DATA:  Headaches for 2 months.  Non intractable headache. EXAM: MRI HEAD WITHOUT AND WITH CONTRAST TECHNIQUE: Multiplanar, multiecho pulse sequences of the brain and surrounding structures were obtained without and with intravenous contrast. CONTRAST:  49mL MULTIHANCE GADOBENATE DIMEGLUMINE 529 MG/ML IV SOLN COMPARISON:  None. FINDINGS: Brain: Periventricular and subcortical T2 hyperintensities bilaterally are mildly advanced for age. No acute infarct, hemorrhage, or mass lesion is present. Ventricles are of normal size. Insert pass fluid The internal auditory canals are within normal limits bilaterally. The brainstem and cerebellum are normal. Vascular: Flow is present in the major intracranial arteries. Skull and upper cervical spine: The skull base is within normal limits. Craniocervical junction is normal. The upper cervical spine is within normal limits. Sinuses/Orbits: Paranasal sinuses and mastoid air cells are clear. Bilateral lens replacements are present. Globes and orbits are within limits. Other: None. IMPRESSION: 1. Periventricular and subcortical T2 hyperintensities bilaterally are mildly advanced for  age. The finding is nonspecific  but can be seen in the setting of chronic microvascular ischemia, a demyelinating process such as multiple sclerosis, vasculitis, complicated migraine headaches, or as the sequelae of a prior infectious or inflammatory process. 2. No acute or focal abnormality to explain headaches otherwise. Electronically Signed   By: San Morelle M.D.   On: 05/14/2017 10:13       Assessment & Plan:   Problem List Items Addressed This Visit    Anemia - Primary    Has a history of iron deficiency and B12 deficiency.  Recheck cbc and ferritin.  Had colonoscopy and EGD.        Relevant Orders   CBC with Differential/Platelet (Completed)   Ferritin (Completed)   Fatigue    Increased fatigue as outlined.  She felt was related to her recent injections.  Will check cbc, ferritin and routine labs.  Hold on further w/up.  Follow.  Notify me if persistent.        Relevant Orders   TSH (Completed)   GERD (gastroesophageal reflux disease)    Controlled on current regimen.  Follow.        Headache    Followed by neurology.  Stable.       Relevant Medications   topiramate (TOPAMAX) 50 MG tablet   Hypercholesterolemia    Low cholesterol diet and exercise.  Follow lipid panel.        Relevant Orders   Hepatic function panel (Completed)   Lipid panel (Completed)   Basic metabolic panel (Completed)   Lymphoma ([place])    Followed by Dr Leretha Pol.  Has f/u planned 01/2018.        Relevant Medications   topiramate (TOPAMAX) 50 MG tablet       Einar Pheasant, MD

## 2017-11-30 ENCOUNTER — Encounter: Payer: Self-pay | Admitting: Internal Medicine

## 2017-11-30 NOTE — Assessment & Plan Note (Signed)
Increased fatigue as outlined.  She felt was related to her recent injections.  Will check cbc, ferritin and routine labs.  Hold on further w/up.  Follow.  Notify me if persistent.

## 2017-11-30 NOTE — Assessment & Plan Note (Signed)
Followed by Dr Leretha Pol.  Has f/u planned 01/2018.

## 2017-11-30 NOTE — Assessment & Plan Note (Signed)
Controlled on current regimen.  Follow.  

## 2017-11-30 NOTE — Assessment & Plan Note (Signed)
Followed by neurology.  Stable  

## 2017-11-30 NOTE — Assessment & Plan Note (Signed)
Low cholesterol diet and exercise.  Follow lipid panel.   

## 2017-11-30 NOTE — Assessment & Plan Note (Signed)
Has a history of iron deficiency and B12 deficiency.  Recheck cbc and ferritin.  Had colonoscopy and EGD.

## 2017-12-11 DIAGNOSIS — B0229 Other postherpetic nervous system involvement: Secondary | ICD-10-CM | POA: Diagnosis not present

## 2017-12-11 DIAGNOSIS — Z5181 Encounter for therapeutic drug level monitoring: Secondary | ICD-10-CM | POA: Diagnosis not present

## 2017-12-11 DIAGNOSIS — C8590 Non-Hodgkin lymphoma, unspecified, unspecified site: Secondary | ICD-10-CM | POA: Diagnosis not present

## 2017-12-11 DIAGNOSIS — Z96651 Presence of right artificial knee joint: Secondary | ICD-10-CM | POA: Diagnosis not present

## 2017-12-24 DIAGNOSIS — K581 Irritable bowel syndrome with constipation: Secondary | ICD-10-CM | POA: Diagnosis not present

## 2017-12-27 ENCOUNTER — Encounter: Payer: Self-pay | Admitting: Internal Medicine

## 2017-12-27 ENCOUNTER — Ambulatory Visit (INDEPENDENT_AMBULATORY_CARE_PROVIDER_SITE_OTHER): Payer: Medicare Other | Admitting: Internal Medicine

## 2017-12-27 VITALS — BP 112/68 | HR 96 | Temp 98.2°F | Resp 18 | Wt 166.8 lb

## 2017-12-27 DIAGNOSIS — E7800 Pure hypercholesterolemia, unspecified: Secondary | ICD-10-CM | POA: Diagnosis not present

## 2017-12-27 DIAGNOSIS — M797 Fibromyalgia: Secondary | ICD-10-CM

## 2017-12-27 DIAGNOSIS — M545 Low back pain, unspecified: Secondary | ICD-10-CM

## 2017-12-27 DIAGNOSIS — G8929 Other chronic pain: Secondary | ICD-10-CM | POA: Diagnosis not present

## 2017-12-27 DIAGNOSIS — R739 Hyperglycemia, unspecified: Secondary | ICD-10-CM | POA: Diagnosis not present

## 2017-12-27 DIAGNOSIS — K219 Gastro-esophageal reflux disease without esophagitis: Secondary | ICD-10-CM | POA: Diagnosis not present

## 2017-12-27 DIAGNOSIS — K589 Irritable bowel syndrome without diarrhea: Secondary | ICD-10-CM

## 2017-12-27 DIAGNOSIS — D649 Anemia, unspecified: Secondary | ICD-10-CM | POA: Diagnosis not present

## 2017-12-27 LAB — POCT GLYCOSYLATED HEMOGLOBIN (HGB A1C): Hemoglobin A1C: 5.2 % (ref 4.0–5.6)

## 2017-12-27 MED ORDER — MUPIROCIN 2 % EX OINT: TOPICAL_OINTMENT | CUTANEOUS | 0 refills | Status: DC

## 2017-12-27 NOTE — Progress Notes (Signed)
Patient ID: Maria Keller, female   DOB: 1950/03/18, 67 y.o.   MRN: [id number]   Subjective:    Patient ID: Maria Keller, female    DOB: 1950/12/08, 67 y.o.   MRN: [id number]  HPI  Patient here for a scheduled follow up.  She is feeling better.  Energy better.  Has adjusted her diet.  Weight is down.  Tries to stay active.  Saw GI.  Has IBS-C.  Stable.  No chest pain.  Breathing stable.  No acid reflux.  No abdominal pain.  Bowels moving.  Seeing pain clinic.  Back is stable.  Discussed previous labs.  Elevated sugar.  Discussed a1c.  Discussed cholesterol labs.  Will hold on additional medication.  Follow.  May need another trial of statin medication in future.     Past Medical History:  Diagnosis Date  . Anemia    iron deficiency and B12 deficiency  . Arthritis   . Bronchitis 08/2015  . Chronic back pain    followed by Dr Primus Bravo  . Depression   . Diverticulosis   . Esophageal stricture    requiring dilatation x 2  . Fibromyalgia   . Fibromyalgia   . Fibromyalgia   . Follicular lymphoma ([place])    Followed by Dr Leretha Pol, s/p chemo and XRT  . Follicular lymphoma ([place])   . GERD (gastroesophageal reflux disease)   . GERD (gastroesophageal reflux disease)   . Hemorrhoid   . Hiatal hernia   . History of kidney stones   . Hypercholesterolemia   . IBS (irritable bowel syndrome)   . IBS (irritable bowel syndrome)   . Nephrolithiasis    followed by Dr Bernardo Heater, s/p stents  . PONV (postoperative nausea and vomiting)   . Shingles outbreak 11/26/2011  . Sleep apnea    Past Surgical History:  Procedure Laterality Date  . APPENDECTOMY    . BLADDER SURGERY  1993  . BREAST BIOPSY N/A   . capsule endoscopy    . CHOLECYSTECTOMY  1995  . COLONOSCOPY WITH PROPOFOL N/A 06/19/2017   Procedure: COLONOSCOPY WITH PROPOFOL;  Surgeon: Manya Silvas, MD;  Location: [facility] ENDOSCOPY;  Service: Endoscopy;  Laterality: N/A;  . ESOPHAGOGASTRODUODENOSCOPY (EGD) WITH PROPOFOL N/A 06/19/2017   Procedure: ESOPHAGOGASTRODUODENOSCOPY (EGD) WITH PROPOFOL;  Surgeon: Manya Silvas, MD;  Location: [facility] ENDOSCOPY;  Service: Endoscopy;  Laterality: N/A;  . FRACTURE SURGERY    . history of helicobacter pylori infection    . JOINT REPLACEMENT    . LEFT OOPHORECTOMY    . LEG SURGERY Left   . ORIF ANKLE FRACTURE  2012  . OVARIAN CYST REMOVAL     right  . RECTOCELE REPAIR N/A   . REPLACEMENT TOTAL KNEE Right 03/19/2016  . TONSILECTOMY/ADENOIDECTOMY WITH MYRINGOTOMY  1970  . TONSILLECTOMY N/A   . TUBAL LIGATION  1976  . VAGINAL HYSTERECTOMY  1980's   secondary to bleeding   Family History  Problem Relation Age of Onset  . Ulcers Mother   . Ulcers Father   . Breast cancer Unknown        great aunt and grandfather  . Lung cancer Unknown        uncle  . Hypertension Maternal Grandmother   . Heart disease Maternal Grandmother        MI 3s  . Diabetes Maternal Grandmother   . Arthritis Maternal Grandmother   . Diabetes Maternal Grandfather    Social History   Socioeconomic History  . Marital status: Married  Spouse name: Not on file  . Number of children: Not on file  . Years of education: Not on file  . Highest education level: Not on file  Occupational History  . Not on file  Social Needs  . Financial resource strain: Not on file  . Food insecurity:    Worry: Not on file    Inability: Not on file  . Transportation needs:    Medical: Not on file    Non-medical: Not on file  Tobacco Use  . Smoking status: Never Smoker  . Smokeless tobacco: Never Used  Substance and Sexual Activity  . Alcohol use: No    Alcohol/week: 0.0 standard drinks  . Drug use: No  . Sexual activity: Yes  Lifestyle  . Physical activity:    Days per week: Not on file    Minutes per session: Not on file  . Stress: Not on file  Relationships  . Social connections:    Talks on phone: Not on file    Gets together: Not on file    Attends religious service: Not on file    Active member  of club or organization: Not on file    Attends meetings of clubs or organizations: Not on file    Relationship status: Not on file  Other Topics Concern  . Not on file  Social History Narrative  . Not on file    Outpatient Encounter Medications as of 12/27/2017  Medication Sig  . aspirin EC 81 MG tablet Take by mouth.  Marland Kitchen azelastine (ASTELIN) 0.1 % nasal spray ONER SPRAY IN EACH NOSTRIL TWICE DAILY AS DIRECTED  . calcium carbonate (TUMS - DOSED IN MG ELEMENTAL CALCIUM) 500 MG chewable tablet Chew 1 tablet by mouth as needed.  . cetirizine (ZYRTEC) 10 MG tablet Take by mouth.  . cyanocobalamin 1000 MCG tablet Take 1,000 mcg by mouth daily.  . Diphenhyd-Hydrocort-Nystatin (FIRST-DUKES MOUTHWASH) SUSP Swish and spit 5 mLs tid prn  . fentaNYL (DURAGESIC) 100 MCG/HR Apply 1 patch to skin every 2 days with fentanyl patch 25 g per hour if tolerated  . fentaNYL (DURAGESIC) 25 MCG/HR patch Apply 1 patch to skin every 2 days with fentanyl patch 100 g per hour if tolerated  . fluticasone (FLONASE) 50 MCG/ACT nasal spray Place 2 sprays into both nostrils daily.  . furosemide (LASIX) 20 MG tablet Take 1 tablet (20 mg total) by mouth daily as needed.  . mupirocin ointment (BACTROBAN) 2 % Apply to affected area bid  . Omega-3 Fatty Acids (FISH OIL) 1360 MG CAPS Take 1 capsule by mouth daily.  Marland Kitchen oxyCODONE (ROXICODONE) 5 MG immediate release tablet Limit 1 tab by mouth per day or twice per day if tolerated for breakthrough pain while wearing fentanyl patches  . senna-docusate (SENOKOT-S) 8.6-50 MG per tablet Take by mouth.  . valACYclovir (VALTREX) 500 MG tablet TAKE ONE TABLET EVERY DAY  . [DISCONTINUED] estrogens, conjugated, (PREMARIN) 0.625 MG tablet Take 0.625 mg by mouth daily. Take daily for 21 days then do not take for 7 days.  . [DISCONTINUED] magnesium oxide (MAG-OX) 400 (241.3 Mg) MG tablet TAKE ONE TABLET BY MOUTH EVERY DAY  . [DISCONTINUED] omeprazole (PRILOSEC) 20 MG capsule TAKE 1 CAPSULE  TWICE DAILY  . [DISCONTINUED] topiramate (TOPAMAX) 50 MG tablet Take 1 tablet by mouth daily.   No facility-administered encounter medications on file as of 12/27/2017.     Review of Systems  Constitutional: Negative for appetite change.       Has adjusted  diet.  Lost weight.    HENT: Negative for congestion and sinus pressure.   Respiratory: Negative for cough, chest tightness and shortness of breath.   Cardiovascular: Negative for chest pain, palpitations and leg swelling.  Gastrointestinal: Negative for abdominal pain, diarrhea, nausea and vomiting.  Genitourinary: Negative for difficulty urinating and dysuria.  Musculoskeletal: Negative for joint swelling and myalgias.       Back stable.    Skin: Negative for color change and rash.  Neurological: Negative for dizziness, light-headedness and headaches.  Psychiatric/Behavioral: Negative for agitation and dysphoric mood.       Objective:     Blood pressure rechecked by me:  118/8  Physical Exam  Constitutional: She appears well-developed and well-nourished. No distress.  HENT:  Nose: Nose normal.  Mouth/Throat: Oropharynx is clear and moist.  Neck: Neck supple. No thyromegaly present.  Cardiovascular: Normal rate and regular rhythm.  Pulmonary/Chest: Breath sounds normal. No respiratory distress. She has no wheezes.  Abdominal: Soft. Bowel sounds are normal. There is no tenderness.  Musculoskeletal: She exhibits no edema or tenderness.  Lymphadenopathy:    She has no cervical adenopathy.  Skin: No rash noted. No erythema.  Psychiatric: She has a normal mood and affect. Her behavior is normal.    BP 112/68 (BP Location: Left Arm, Patient Position: Sitting, Cuff Size: Normal)   Pulse 96   Temp 98.2 F (36.8 C) (Oral)   Resp 18   Wt 166 lb 12.8 oz (75.7 kg)   SpO2 97%   BMI 26.12 kg/m  Wt Readings from Last 3 Encounters:  12/27/17 166 lb 12.8 oz (75.7 kg)  11/26/17 168 lb (76.2 kg)  07/12/17 183 lb 9.6 oz (83.3 kg)      Lab Results  Component Value Date   WBC 9.4 11/26/2017   HGB 13.2 11/26/2017   HCT 39.9 11/26/2017   PLT 256.0 11/26/2017   GLUCOSE 120 (H) 11/26/2017   CHOL 265 (H) 11/26/2017   TRIG 120.0 11/26/2017   HDL 54.90 11/26/2017   LDLDIRECT 164.2 02/11/2013   LDLCALC 186 (H) 11/26/2017   ALT 11 11/26/2017   AST 15 11/26/2017   NA 138 11/26/2017   K 3.8 11/26/2017   CL 100 11/26/2017   CREATININE 0.97 11/26/2017   BUN 14 11/26/2017   CO2 31 11/26/2017   TSH 0.54 11/26/2017   HGBA1C 5.2 12/27/2017    Mr Brain W Wo Contrast  Result Date: 05/14/2017 CLINICAL DATA:  Headaches for 2 months.  Non intractable headache. EXAM: MRI HEAD WITHOUT AND WITH CONTRAST TECHNIQUE: Multiplanar, multiecho pulse sequences of the brain and surrounding structures were obtained without and with intravenous contrast. CONTRAST:  80mL MULTIHANCE GADOBENATE DIMEGLUMINE 529 MG/ML IV SOLN COMPARISON:  None. FINDINGS: Brain: Periventricular and subcortical T2 hyperintensities bilaterally are mildly advanced for age. No acute infarct, hemorrhage, or mass lesion is present. Ventricles are of normal size. Insert pass fluid The internal auditory canals are within normal limits bilaterally. The brainstem and cerebellum are normal. Vascular: Flow is present in the major intracranial arteries. Skull and upper cervical spine: The skull base is within normal limits. Craniocervical junction is normal. The upper cervical spine is within normal limits. Sinuses/Orbits: Paranasal sinuses and mastoid air cells are clear. Bilateral lens replacements are present. Globes and orbits are within limits. Other: None. IMPRESSION: 1. Periventricular and subcortical T2 hyperintensities bilaterally are mildly advanced for age. The finding is nonspecific but can be seen in the setting of chronic microvascular ischemia, a demyelinating process such  as multiple sclerosis, vasculitis, complicated migraine headaches, or as the sequelae of a prior  infectious or inflammatory process. 2. No acute or focal abnormality to explain headaches otherwise. Electronically Signed   By: San Morelle M.D.   On: 05/14/2017 10:13       Assessment & Plan:   Problem List Items Addressed This Visit    Anemia    Has a history of iron deficiency and B12 deficiency.  Follow cbc.  Has had colonoscopy and EGD.        Chronic back pain    Followed by pain clinic.  Stable.        Fibromyalgia    Followed by pain clinic.        GERD (gastroesophageal reflux disease)    Controlled on current regimen.        Hypercholesterolemia    Discussed cholesterol.  Discussed the need for cholesterol medication.  Will hold on addition of cholesterol medication at this time.  Low cholesterol diet and exercise.  Will follow.  May need to try another cholesterol medication in future.        Relevant Orders   Hepatic function panel   Lipid panel   Basic metabolic panel   IBS (irritable bowel syndrome)    IBS-C.  Stable.  Saw GI. Note reviewed.         Other Visit Diagnoses    Hyperglycemia    -  Primary   Low carb diet and exercise.  Check a1c.  Has adjusted her diet and lost weight.     Relevant Orders   POCT HgB A1C (Completed)   Hemoglobin A1c       Einar Pheasant, MD

## 2017-12-31 ENCOUNTER — Other Ambulatory Visit: Payer: Self-pay | Admitting: Internal Medicine

## 2018-01-05 ENCOUNTER — Encounter: Payer: Self-pay | Admitting: Internal Medicine

## 2018-01-05 NOTE — Assessment & Plan Note (Signed)
Followed by pain clinic.  Stable.  

## 2018-01-05 NOTE — Assessment & Plan Note (Signed)
Followed by pain clinic.  

## 2018-01-05 NOTE — Assessment & Plan Note (Signed)
IBS-C.  Stable.  Saw GI. Note reviewed.

## 2018-01-05 NOTE — Assessment & Plan Note (Signed)
Discussed cholesterol.  Discussed the need for cholesterol medication.  Will hold on addition of cholesterol medication at this time.  Low cholesterol diet and exercise.  Will follow.  May need to try another cholesterol medication in future.

## 2018-01-05 NOTE — Assessment & Plan Note (Signed)
Controlled on current regimen.   

## 2018-01-05 NOTE — Assessment & Plan Note (Signed)
Has a history of iron deficiency and B12 deficiency.  Follow cbc.  Has had colonoscopy and EGD   

## 2018-01-07 DIAGNOSIS — B0229 Other postherpetic nervous system involvement: Secondary | ICD-10-CM | POA: Diagnosis not present

## 2018-01-07 DIAGNOSIS — C8590 Non-Hodgkin lymphoma, unspecified, unspecified site: Secondary | ICD-10-CM | POA: Diagnosis not present

## 2018-01-07 DIAGNOSIS — Z96651 Presence of right artificial knee joint: Secondary | ICD-10-CM | POA: Diagnosis not present

## 2018-01-07 DIAGNOSIS — Z5181 Encounter for therapeutic drug level monitoring: Secondary | ICD-10-CM | POA: Diagnosis not present

## 2018-01-08 ENCOUNTER — Other Ambulatory Visit: Payer: Self-pay | Admitting: Internal Medicine

## 2018-01-10 DIAGNOSIS — L409 Psoriasis, unspecified: Secondary | ICD-10-CM | POA: Diagnosis not present

## 2018-01-10 DIAGNOSIS — M150 Primary generalized (osteo)arthritis: Secondary | ICD-10-CM | POA: Diagnosis not present

## 2018-02-01 DIAGNOSIS — Z23 Encounter for immunization: Secondary | ICD-10-CM | POA: Diagnosis not present

## 2018-02-04 DIAGNOSIS — M5030 Other cervical disc degeneration, unspecified cervical region: Secondary | ICD-10-CM | POA: Diagnosis not present

## 2018-02-04 DIAGNOSIS — M542 Cervicalgia: Secondary | ICD-10-CM | POA: Diagnosis not present

## 2018-02-04 DIAGNOSIS — Z96651 Presence of right artificial knee joint: Secondary | ICD-10-CM | POA: Diagnosis not present

## 2018-02-04 DIAGNOSIS — B0229 Other postherpetic nervous system involvement: Secondary | ICD-10-CM | POA: Diagnosis not present

## 2018-03-04 DIAGNOSIS — C8590 Non-Hodgkin lymphoma, unspecified, unspecified site: Secondary | ICD-10-CM | POA: Diagnosis not present

## 2018-03-04 DIAGNOSIS — B0229 Other postherpetic nervous system involvement: Secondary | ICD-10-CM | POA: Diagnosis not present

## 2018-03-04 DIAGNOSIS — Z5181 Encounter for therapeutic drug level monitoring: Secondary | ICD-10-CM | POA: Diagnosis not present

## 2018-03-04 DIAGNOSIS — Z96651 Presence of right artificial knee joint: Secondary | ICD-10-CM | POA: Diagnosis not present

## 2018-03-12 DIAGNOSIS — C8238 Follicular lymphoma grade IIIa, lymph nodes of multiple sites: Secondary | ICD-10-CM | POA: Diagnosis not present

## 2018-03-20 DIAGNOSIS — R21 Rash and other nonspecific skin eruption: Secondary | ICD-10-CM | POA: Diagnosis not present

## 2018-03-20 DIAGNOSIS — L738 Other specified follicular disorders: Secondary | ICD-10-CM | POA: Diagnosis not present

## 2018-03-26 DIAGNOSIS — L738 Other specified follicular disorders: Secondary | ICD-10-CM | POA: Diagnosis not present

## 2018-03-31 DIAGNOSIS — C8230 Follicular lymphoma grade IIIa, unspecified site: Secondary | ICD-10-CM | POA: Diagnosis not present

## 2018-03-31 DIAGNOSIS — C8238 Follicular lymphoma grade IIIa, lymph nodes of multiple sites: Secondary | ICD-10-CM | POA: Diagnosis not present

## 2018-04-01 DIAGNOSIS — M792 Neuralgia and neuritis, unspecified: Secondary | ICD-10-CM | POA: Diagnosis not present

## 2018-04-01 DIAGNOSIS — M25559 Pain in unspecified hip: Secondary | ICD-10-CM | POA: Diagnosis not present

## 2018-04-01 DIAGNOSIS — G8929 Other chronic pain: Secondary | ICD-10-CM | POA: Diagnosis not present

## 2018-04-01 DIAGNOSIS — B0229 Other postherpetic nervous system involvement: Secondary | ICD-10-CM | POA: Diagnosis not present

## 2018-04-01 DIAGNOSIS — C8590 Non-Hodgkin lymphoma, unspecified, unspecified site: Secondary | ICD-10-CM | POA: Diagnosis not present

## 2018-04-01 DIAGNOSIS — M47897 Other spondylosis, lumbosacral region: Secondary | ICD-10-CM | POA: Diagnosis not present

## 2018-04-01 DIAGNOSIS — M79609 Pain in unspecified limb: Secondary | ICD-10-CM | POA: Diagnosis not present

## 2018-04-01 DIAGNOSIS — Z5181 Encounter for therapeutic drug level monitoring: Secondary | ICD-10-CM | POA: Diagnosis not present

## 2018-04-01 DIAGNOSIS — M48062 Spinal stenosis, lumbar region with neurogenic claudication: Secondary | ICD-10-CM | POA: Diagnosis not present

## 2018-04-01 DIAGNOSIS — Z96651 Presence of right artificial knee joint: Secondary | ICD-10-CM | POA: Diagnosis not present

## 2018-04-02 DIAGNOSIS — C8238 Follicular lymphoma grade IIIa, lymph nodes of multiple sites: Secondary | ICD-10-CM | POA: Diagnosis not present

## 2018-04-10 ENCOUNTER — Other Ambulatory Visit: Payer: Self-pay | Admitting: Internal Medicine

## 2018-04-29 DIAGNOSIS — M5030 Other cervical disc degeneration, unspecified cervical region: Secondary | ICD-10-CM | POA: Diagnosis not present

## 2018-04-29 DIAGNOSIS — Z96651 Presence of right artificial knee joint: Secondary | ICD-10-CM | POA: Diagnosis not present

## 2018-04-29 DIAGNOSIS — M542 Cervicalgia: Secondary | ICD-10-CM | POA: Diagnosis not present

## 2018-04-29 DIAGNOSIS — B0229 Other postherpetic nervous system involvement: Secondary | ICD-10-CM | POA: Diagnosis not present

## 2018-04-30 ENCOUNTER — Other Ambulatory Visit: Payer: Medicare Other

## 2018-05-01 ENCOUNTER — Ambulatory Visit: Payer: Medicare Other | Admitting: Internal Medicine

## 2018-05-01 ENCOUNTER — Ambulatory Visit: Payer: Medicare Other

## 2018-05-07 ENCOUNTER — Ambulatory Visit: Payer: Medicare Other

## 2018-05-27 DIAGNOSIS — B0229 Other postherpetic nervous system involvement: Secondary | ICD-10-CM | POA: Diagnosis not present

## 2018-05-27 DIAGNOSIS — C8590 Non-Hodgkin lymphoma, unspecified, unspecified site: Secondary | ICD-10-CM | POA: Diagnosis not present

## 2018-05-27 DIAGNOSIS — Z96651 Presence of right artificial knee joint: Secondary | ICD-10-CM | POA: Diagnosis not present

## 2018-05-27 DIAGNOSIS — Z5181 Encounter for therapeutic drug level monitoring: Secondary | ICD-10-CM | POA: Diagnosis not present

## 2018-06-24 DIAGNOSIS — B0229 Other postherpetic nervous system involvement: Secondary | ICD-10-CM | POA: Diagnosis not present

## 2018-06-24 DIAGNOSIS — Z96651 Presence of right artificial knee joint: Secondary | ICD-10-CM | POA: Diagnosis not present

## 2018-06-24 DIAGNOSIS — C8590 Non-Hodgkin lymphoma, unspecified, unspecified site: Secondary | ICD-10-CM | POA: Diagnosis not present

## 2018-06-24 DIAGNOSIS — Z5181 Encounter for therapeutic drug level monitoring: Secondary | ICD-10-CM | POA: Diagnosis not present

## 2018-07-22 DIAGNOSIS — Z96651 Presence of right artificial knee joint: Secondary | ICD-10-CM | POA: Diagnosis not present

## 2018-07-22 DIAGNOSIS — Z5181 Encounter for therapeutic drug level monitoring: Secondary | ICD-10-CM | POA: Diagnosis not present

## 2018-07-22 DIAGNOSIS — M792 Neuralgia and neuritis, unspecified: Secondary | ICD-10-CM | POA: Diagnosis not present

## 2018-07-22 DIAGNOSIS — M79609 Pain in unspecified limb: Secondary | ICD-10-CM | POA: Diagnosis not present

## 2018-07-22 DIAGNOSIS — M48062 Spinal stenosis, lumbar region with neurogenic claudication: Secondary | ICD-10-CM | POA: Diagnosis not present

## 2018-07-22 DIAGNOSIS — M4726 Other spondylosis with radiculopathy, lumbar region: Secondary | ICD-10-CM | POA: Diagnosis not present

## 2018-07-22 DIAGNOSIS — G8929 Other chronic pain: Secondary | ICD-10-CM | POA: Diagnosis not present

## 2018-07-22 DIAGNOSIS — C8590 Non-Hodgkin lymphoma, unspecified, unspecified site: Secondary | ICD-10-CM | POA: Diagnosis not present

## 2018-07-22 DIAGNOSIS — B0229 Other postherpetic nervous system involvement: Secondary | ICD-10-CM | POA: Diagnosis not present

## 2018-07-22 DIAGNOSIS — M4807 Spinal stenosis, lumbosacral region: Secondary | ICD-10-CM | POA: Diagnosis not present

## 2018-07-22 DIAGNOSIS — M25559 Pain in unspecified hip: Secondary | ICD-10-CM | POA: Diagnosis not present

## 2018-07-22 DIAGNOSIS — M47897 Other spondylosis, lumbosacral region: Secondary | ICD-10-CM | POA: Diagnosis not present

## 2018-08-18 DIAGNOSIS — B0229 Other postherpetic nervous system involvement: Secondary | ICD-10-CM | POA: Diagnosis not present

## 2018-08-18 DIAGNOSIS — Z5181 Encounter for therapeutic drug level monitoring: Secondary | ICD-10-CM | POA: Diagnosis not present

## 2018-08-18 DIAGNOSIS — C8590 Non-Hodgkin lymphoma, unspecified, unspecified site: Secondary | ICD-10-CM | POA: Diagnosis not present

## 2018-08-18 DIAGNOSIS — Z96651 Presence of right artificial knee joint: Secondary | ICD-10-CM | POA: Diagnosis not present

## 2018-08-19 ENCOUNTER — Ambulatory Visit (INDEPENDENT_AMBULATORY_CARE_PROVIDER_SITE_OTHER): Payer: Medicare Other

## 2018-08-19 ENCOUNTER — Other Ambulatory Visit: Payer: Self-pay

## 2018-08-19 DIAGNOSIS — Z0000 Encounter for general adult medical examination without abnormal findings: Secondary | ICD-10-CM

## 2018-08-19 NOTE — Progress Notes (Signed)
Subjective:   Maria Keller is a 68 y.o. female who presents for Medicare Annual (Subsequent) preventive examination.  Review of Systems:  No ROS.  Medicare Wellness Virtual Visit.  Visual/audio telehealth visit, UTA vital signs.   See social history for additional risk factors.   Cardiac Risk Factors include: advanced age (>8men, >55 women)     Objective:     Vitals: There were no vitals taken for this visit.  There is no height or weight on file to calculate BMI.  Advanced Directives 08/19/2018 06/19/2017 04/29/2017 04/26/2016 10/12/2015 09/13/2015 08/12/2015  Does Patient Have a Medical Advance Directive? Yes Yes Yes Yes Yes Yes Yes  Type of Paramedic of [place];Living will - [place];Living will [place];Living will Living will Living will;Healthcare Power of Attorney -  Does patient want to make changes to medical advance directive? No - Patient declined - No - Patient declined No - Patient declined - No - Patient declined -  Copy of [place] in Chart? No - copy requested - No - copy requested No - copy requested - No - copy requested -    Tobacco Social History   Tobacco Use  Smoking Status Never Smoker  Smokeless Tobacco Never Used     Counseling given: Not Answered   Clinical Intake:  Pre-visit preparation completed: No        Diabetes: No  How often do you need to have someone help you when you read instructions, pamphlets, or other written materials from your doctor or pharmacy?: 1 - Never        Past Medical History:  Diagnosis Date  . Anemia    iron deficiency and B12 deficiency  . Arthritis   . Bronchitis 08/2015  . Chronic back pain    followed by Dr Primus Bravo  . Depression   . Diverticulosis   . Esophageal stricture    requiring dilatation x 2  . Fibromyalgia   . Fibromyalgia   . Fibromyalgia   . Follicular lymphoma ([place])    Followed by Dr Leretha Pol, s/p chemo  and XRT  . Follicular lymphoma ([place])   . GERD (gastroesophageal reflux disease)   . GERD (gastroesophageal reflux disease)   . Hemorrhoid   . Hiatal hernia   . History of kidney stones   . Hypercholesterolemia   . IBS (irritable bowel syndrome)   . IBS (irritable bowel syndrome)   . Nephrolithiasis    followed by Dr Bernardo Heater, s/p stents  . PONV (postoperative nausea and vomiting)   . Shingles outbreak 11/26/2011  . Sleep apnea    Past Surgical History:  Procedure Laterality Date  . APPENDECTOMY    . BLADDER SURGERY  1993  . BREAST BIOPSY N/A   . capsule endoscopy    . CHOLECYSTECTOMY  1995  . COLONOSCOPY WITH PROPOFOL N/A 06/19/2017   Procedure: COLONOSCOPY WITH PROPOFOL;  Surgeon: Manya Silvas, MD;  Location: [facility] ENDOSCOPY;  Service: Endoscopy;  Laterality: N/A;  . ESOPHAGOGASTRODUODENOSCOPY (EGD) WITH PROPOFOL N/A 06/19/2017   Procedure: ESOPHAGOGASTRODUODENOSCOPY (EGD) WITH PROPOFOL;  Surgeon: Manya Silvas, MD;  Location[facility] ENDOSCOPY;  Service: Endoscopy;  Laterality: N/A;  . FRACTURE SURGERY    . history of helicobacter pylori infection    . JOINT REPLACEMENT    . LEFT OOPHORECTOMY    . LEG SURGERY Left   . ORIF ANKLE FRACTURE  2012  . OVARIAN CYST REMOVAL     right  .  RECTOCELE REPAIR N/A   . REPLACEMENT TOTAL KNEE Right 03/19/2016  . TONSILECTOMY/ADENOIDECTOMY WITH MYRINGOTOMY  1970  . TONSILLECTOMY N/A   . TUBAL LIGATION  1976  . VAGINAL HYSTERECTOMY  1980's   secondary to bleeding   Family History  Problem Relation Age of Onset  . Ulcers Mother   . Ulcers Father   . Breast cancer Other        great aunt and grandfather  . Lung cancer Other        uncle  . Hypertension Maternal Grandmother   . Heart disease Maternal Grandmother        MI 34s  . Diabetes Maternal Grandmother   . Arthritis Maternal Grandmother   . Diabetes Maternal Grandfather    Social History   Socioeconomic History  . Marital status: Married    Spouse name: Not on file   . Number of children: Not on file  . Years of education: Not on file  . Highest education level: Not on file  Occupational History  . Not on file  Social Needs  . Financial resource strain: Not hard at all  . Food insecurity    Worry: Never true    Inability: Never true  . Transportation needs    Medical: No    Non-medical: No  Tobacco Use  . Smoking status: Never Smoker  . Smokeless tobacco: Never Used  Substance and Sexual Activity  . Alcohol use: No    Alcohol/week: 0.0 standard drinks  . Drug use: No  . Sexual activity: Yes  Lifestyle  . Physical activity    Days per week: 1 day    Minutes per session: 30 min  . Stress: Not at all  Relationships  . Social Herbalist on phone: Not on file    Gets together: Not on file    Attends religious service: Not on file    Active member of club or organization: Not on file    Attends meetings of clubs or organizations: Not on file    Relationship status: Not on file  Other Topics Concern  . Not on file  Social History Narrative  . Not on file    Outpatient Encounter Medications as of 08/19/2018  Medication Sig  . aspirin EC 81 MG tablet Take by mouth.  Marland Kitchen azelastine (ASTELIN) 0.1 % nasal spray ONER SPRAY IN EACH NOSTRIL TWICE DAILY AS DIRECTED  . calcium carbonate (TUMS - DOSED IN MG ELEMENTAL CALCIUM) 500 MG chewable tablet Chew 1 tablet by mouth as needed.  . cetirizine (ZYRTEC) 10 MG tablet Take by mouth.  . cyanocobalamin 1000 MCG tablet Take 1,000 mcg by mouth daily.  . Diphenhyd-Hydrocort-Nystatin (FIRST-DUKES MOUTHWASH) SUSP Swish and spit 5 mLs tid prn  . fentaNYL (DURAGESIC) 100 MCG/HR Apply 1 patch to skin every 2 days with fentanyl patch 25 g per hour if tolerated  . fentaNYL (DURAGESIC) 25 MCG/HR patch Apply 1 patch to skin every 2 days with fentanyl patch 100 g per hour if tolerated  . fluticasone (FLONASE) 50 MCG/ACT nasal spray Place 2 sprays into both nostrils daily.  . furosemide (LASIX) 20 MG  tablet TAKE ONE TABLET BY MOUTH EVERY DAY AS NEEDED  . mupirocin ointment (BACTROBAN) 2 % Apply to affected area bid  . Omega-3 Fatty Acids (FISH OIL) 1360 MG CAPS Take 1 capsule by mouth daily.  Marland Kitchen omeprazole (PRILOSEC) 20 MG capsule TAKE 1 CAPSULE BY MOUTH TWICE DAILY  . oxyCODONE (ROXICODONE) 5 MG immediate  release tablet Limit 1 tab by mouth per day or twice per day if tolerated for breakthrough pain while wearing fentanyl patches  . senna-docusate (SENOKOT-S) 8.6-50 MG per tablet Take by mouth.  . valACYclovir (VALTREX) 500 MG tablet TAKE ONE TABLET EVERY DAY   No facility-administered encounter medications on file as of 08/19/2018.     Activities of Daily Living In your present state of health, do you have any difficulty performing the following activities: 08/19/2018  Hearing? N  Vision? N  Difficulty concentrating or making decisions? N  Walking or climbing stairs? N  Dressing or bathing? N  Doing errands, shopping? N  Preparing Food and eating ? N  Using the Toilet? N  In the past six months, have you accidently leaked urine? N  Do you have problems with loss of bowel control? N  Managing your Medications? N  Managing your Finances? N  Housekeeping or managing your Housekeeping? N  Some recent data might be hidden    Patient Care Team: Einar Pheasant, MD as PCP - General (Internal Medicine)    Assessment:   This is a routine wellness examination for Maria Keller.  I connected with patient 08/19/18 at 10:00 AM EDT by an audio enabled telemedicine application and verified that I am speaking with the correct person using two identifiers. Patient stated full name and DOB. Patient gave permission to continue with virtual visit. Patient's location was at home and Nurse's location was at [place] office.   Fasting labs scheduled 7/28 4 month follow up scheduled 7/30  Health Screenings  Mammogram - 08/2017 Colonoscopy - 06/2017 Bone Density - 05/2015 Glaucoma -none Hearing  -demonstrates normal hearing during visit. Labs followed by pcp Cholesterol - 11/2017 Dental- UTD Vision- visits within the last 12 months.  Social  Alcohol intake - no         Smoking history- never    Smokers in home? none Illicit drug use? none Exercise - walking daily, 10 minutes Diet - low carb Sexually Active -yes BMI- discussed the importance of a healthy diet, water intake and the benefits of aerobic exercise.  Educational material provided.   Safety  Patient feels safe at home- yes Patient does have smoke detectors at home- yes Patient does wear sunscreen or protective clothing when in direct sunlight -yes Patient does wear seat belt when in a moving vehicle -yes Patient drive- yes  [id number] precautions and sickness symptoms discussed.   Activities of Daily Living Patient denies needing assistance with: driving, household chores, feeding themselves, getting from bed to chair, getting to the toilet, bathing/showering, dressing, managing money, or preparing meals.  No new identified risk were noted.    Depression Screen Patient denies losing interest in daily life, feeling hopeless, or crying easily over simple problems.   Medication-taking as directed and without issues.   Fall Screen Patient denies being afraid of falling or falling in the last year.   Memory Screen Patient is alert.  Patient denies difficulty focusing, concentrating or misplacing items. Correctly identified the president of the [place] , season and recall. Patient likes to read, play computer games, and work puzzles for brain stimulation.  Immunizations The following Immunizations were discussed: Influenza, shingles, pneumonia, and tetanus.   Other Providers Patient Care Team: Einar Pheasant, MD as PCP - General (Internal Medicine)  Exercise Activities and Dietary recommendations Current Exercise Habits: Home exercise routine, Type of exercise: walking, Time (Minutes): 10, Frequency  (Times/Week): 5, Weekly Exercise (Minutes/Week): 50, Intensity: Mild  Goals  Patient Stated   . Increase physical activity (pt-stated)     Swim and walk more for exercise        Fall Risk Fall Risk  08/19/2018 04/29/2017 04/26/2016 01/05/2016 10/12/2015  Falls in the past year? 0 No No No No  Comment - - - - -  Risk for fall due to : - - - - -  Risk for fall due to: Comment - - - - -    Depression Screen PHQ 2/9 Scores 08/19/2018 04/29/2017 04/26/2016 01/05/2016  PHQ - 2 Score 0 0 0 0  Exception Documentation - - - -     Cognitive Function MMSE - Mini Mental State Exam 04/29/2017 04/26/2016 04/27/2015  Orientation to time 5 5 5   Orientation to Place 5 5 5   Registration 3 3 3   Attention/ Calculation 5 5 5   Recall 3 3 3   Language- name 2 objects 2 2 2   Language- repeat 1 1 1   Language- follow 3 step command 3 3 3   Language- read & follow direction 1 1 1   Write a sentence 1 1 1   Copy design 1 1 1   Total score 30 30 30      6CIT Screen 08/19/2018  What Year? 0 points  What month? 0 points  What time? 0 points  Count back from 20 0 points  Months in reverse 0 points  Repeat phrase 0 points  Total Score 0    Immunization History  Administered Date(s) Administered  . Influenza Split 02/25/2012  . Influenza, High Dose Seasonal PF 11/17/2015, 01/07/2017, 02/01/2018  . Influenza,inj,Quad PF,6+ Mos 02/22/2014, 10/27/2014  . Influenza-Unspecified 02/29/2012, 10/28/2012, 02/22/2014, 10/27/2014, 11/17/2015  . Pneumococcal Conjugate-13 03/02/2015  . Pneumococcal Polysaccharide-23 01/07/2017  . Zoster 11/06/2005  . Zoster Recombinat (Shingrix) 10/16/2017, 12/24/2017   Screening Tests Health Maintenance  Topic Date Due  . TETANUS/TDAP  11/29/1967  . INFLUENZA VACCINE  09/06/2018  . MAMMOGRAM  08/08/2019  . COLONOSCOPY  06/20/2022  . DEXA SCAN  Completed  . Hepatitis C Screening  Completed  . PNA vac Low Risk Adult  Completed      Plan:    End of life planning;  Advance aging; Advanced directives discussed.  Copy of current HCPOA/Living Will requested.    I have personally reviewed and noted the following in the patient's chart:   . Medical and social history . Use of alcohol, tobacco or illicit drugs  . Current medications and supplements . Functional ability and status . Nutritional status . Physical activity . Advanced directives . List of other physicians . Hospitalizations, surgeries, and ER visits in previous 12 months . Vitals . Screenings to include cognitive, depression, and falls . Referrals and appointments  In addition, I have reviewed and discussed with patient certain preventive protocols, quality metrics, and best practice recommendations. A written personalized care plan for preventive services as well as general preventive health recommendations were provided to patient.     Varney Biles, LPN  3/76/2831   Reviewed above information.  Agree with assessment and plan.    Dr Nicki Reaper

## 2018-08-19 NOTE — Patient Instructions (Addendum)
  Ms. Depriest , Thank you for taking time to come for your Medicare Wellness Visit. I appreciate your ongoing commitment to your health goals. Please review the following plan we discussed and let me know if I can assist you in the future.   These are the goals we discussed: Goals      Patient Stated   . Increase physical activity (pt-stated)     Swim and walk more for exercise        This is a list of the screening recommended for you and due dates:  Health Maintenance  Topic Date Due  . Tetanus Vaccine  11/29/1967  . Flu Shot  09/06/2018  . Mammogram  08/08/2019  . Colon Cancer Screening  06/20/2022  . DEXA scan (bone density measurement)  Completed  .  Hepatitis C: One time screening is recommended by Center for Disease Control  (CDC) for  adults born from [birth year] through 1967.   Completed  . Pneumonia vaccines  Completed

## 2018-09-02 ENCOUNTER — Other Ambulatory Visit: Payer: Self-pay

## 2018-09-02 ENCOUNTER — Other Ambulatory Visit (INDEPENDENT_AMBULATORY_CARE_PROVIDER_SITE_OTHER): Payer: Medicare Other

## 2018-09-02 DIAGNOSIS — R739 Hyperglycemia, unspecified: Secondary | ICD-10-CM | POA: Diagnosis not present

## 2018-09-02 DIAGNOSIS — E7800 Pure hypercholesterolemia, unspecified: Secondary | ICD-10-CM | POA: Diagnosis not present

## 2018-09-02 LAB — BASIC METABOLIC PANEL
BUN: 12 mg/dL (ref 6–23)
CO2: 33 mEq/L — ABNORMAL HIGH (ref 19–32)
Calcium: 9.8 mg/dL (ref 8.4–10.5)
Chloride: 101 mEq/L (ref 96–112)
Creatinine, Ser: 0.79 mg/dL (ref 0.40–1.20)
GFR: 72 mL/min (ref >60.00)
Glucose, Bld: 107 mg/dL — ABNORMAL HIGH (ref 70–99)
Potassium: 4.5 mEq/L (ref 3.5–5.1)
Sodium: 141 mEq/L (ref 135–145)

## 2018-09-02 LAB — HEPATIC FUNCTION PANEL
ALT: 11 U/L (ref 0–35)
AST: 15 U/L (ref 0–37)
Albumin: 4.5 g/dL (ref 3.5–5.2)
Alkaline Phosphatase: 67 U/L (ref 39–117)
Bilirubin, Direct: 0.1 mg/dL (ref 0.0–0.3)
Total Bilirubin: 0.4 mg/dL (ref 0.2–1.2)
Total Protein: 7.1 g/dL (ref 6.0–8.3)

## 2018-09-02 LAB — LIPID PANEL
Cholesterol: 261 mg/dL — ABNORMAL HIGH (ref 0–200)
HDL: 55.7 mg/dL (ref >39.00)
LDL Cholesterol: 182 mg/dL — ABNORMAL HIGH (ref 0–99)
NonHDL: 205.31
Total CHOL/HDL Ratio: 5
Triglycerides: 118 mg/dL (ref 0.0–149.0)
VLDL: 23.6 mg/dL (ref 0.0–40.0)

## 2018-09-02 LAB — HEMOGLOBIN A1C: Hgb A1c MFr Bld: 5.6 % (ref 4.6–6.5)

## 2018-09-04 ENCOUNTER — Ambulatory Visit (INDEPENDENT_AMBULATORY_CARE_PROVIDER_SITE_OTHER): Payer: Medicare Other | Admitting: Internal Medicine

## 2018-09-04 ENCOUNTER — Other Ambulatory Visit: Payer: Self-pay

## 2018-09-04 ENCOUNTER — Encounter: Payer: Self-pay | Admitting: Internal Medicine

## 2018-09-04 DIAGNOSIS — E7800 Pure hypercholesterolemia, unspecified: Secondary | ICD-10-CM | POA: Diagnosis not present

## 2018-09-04 DIAGNOSIS — D649 Anemia, unspecified: Secondary | ICD-10-CM

## 2018-09-04 DIAGNOSIS — Z1231 Encounter for screening mammogram for malignant neoplasm of breast: Secondary | ICD-10-CM

## 2018-09-04 DIAGNOSIS — M545 Low back pain, unspecified: Secondary | ICD-10-CM

## 2018-09-04 DIAGNOSIS — G8929 Other chronic pain: Secondary | ICD-10-CM

## 2018-09-04 DIAGNOSIS — R61 Generalized hyperhidrosis: Secondary | ICD-10-CM

## 2018-09-04 DIAGNOSIS — Z8572 Personal history of non-Hodgkin lymphomas: Secondary | ICD-10-CM | POA: Diagnosis not present

## 2018-09-04 DIAGNOSIS — K219 Gastro-esophageal reflux disease without esophagitis: Secondary | ICD-10-CM | POA: Diagnosis not present

## 2018-09-04 DIAGNOSIS — C8580 Other specified types of non-Hodgkin lymphoma, unspecified site: Secondary | ICD-10-CM

## 2018-09-04 MED ORDER — ESTRADIOL 0.5 MG PO TABS: 0.5000 mg | ORAL_TABLET | Freq: Every day | ORAL | 2 refills | Status: DC

## 2018-09-04 MED ORDER — ATORVASTATIN CALCIUM 10 MG PO TABS: ORAL_TABLET | ORAL | 1 refills | Status: DC

## 2018-09-04 NOTE — Progress Notes (Signed)
Patient ID: Maria Keller, female   DOB: October 13, 1950, 68 y.o.   MRN: [id number]   Virtual Visit via telephone Note  This visit type was conducted due to national recommendations for restrictions regarding the COVID-19 pandemic (e.g. social distancing).  This format is felt to be most appropriate for this patient at this time.  All issues noted in this document were discussed and addressed.  No physical exam was performed (except for noted visual exam findings with Video Visits).   I connected with Kristopher Glee today  by a video enabled telemedicine application or telephone and verified that I am speaking with the correct person using two identifiers. Location patient: home Location provider: work or home office Persons participating in the virtual visit: patient, provider and her husband (Dex[place]).    I discussed the limitations, risks, security and privacy concerns of performing an evaluation and management service by telephone and the availability of in person appointments. The patient expressed understanding and agreed to proceed.   Reason for visit: scheduled follow up.    HPI: She reports she is doing relatively well.  Had f/u with Dr Leretha Pol 03/2018.  Negative CT.  Recommended f/u one year.  She does report persistent hot flashes.  Affecting her sleep.  Becoming a quality of life issue.  Discussed treatment options.  She prefers to start estrogen.  No chest pain.  No sob.  No acid reflux. No abdominal pain.  Bowels moving.  Discussed labs.  Discussed calculated cholesterol risk (7.2% with LDL 182).  Discussed treatment options.  Questions answered.  Weight stable - 167 pounds.     ROS: See pertinent positives and negatives per HPI.  Past Medical History:  Diagnosis Date  . Anemia    iron deficiency and B12 deficiency  . Arthritis   . Bronchitis 08/2015  . Chronic back pain    followed by Dr Primus Bravo  . Depression   . Diverticulosis   . Esophageal stricture    requiring dilatation x 2   . Fibromyalgia   . Fibromyalgia   . Fibromyalgia   . Follicular lymphoma ([place])    Followed by Dr Leretha Pol, s/p chemo and XRT  . Follicular lymphoma ([place])   . GERD (gastroesophageal reflux disease)   . GERD (gastroesophageal reflux disease)   . Hemorrhoid   . Hiatal hernia   . History of kidney stones   . Hypercholesterolemia   . IBS (irritable bowel syndrome)   . IBS (irritable bowel syndrome)   . Nephrolithiasis    followed by Dr Bernardo Heater, s/p stents  . PONV (postoperative nausea and vomiting)   . Shingles outbreak 11/26/2011  . Sleep apnea     Past Surgical History:  Procedure Laterality Date  . APPENDECTOMY    . BLADDER SURGERY  1993  . BREAST BIOPSY N/A   . capsule endoscopy    . CHOLECYSTECTOMY  1995  . COLONOSCOPY WITH PROPOFOL N/A 06/19/2017   Procedure: COLONOSCOPY WITH PROPOFOL;  Surgeon: Manya Silvas, MD;  Location: [facility] ENDOSCOPY;  Service: Endoscopy;  Laterality: N/A;  . ESOPHAGOGASTRODUODENOSCOPY (EGD) WITH PROPOFOL N/A 06/19/2017   Procedure: ESOPHAGOGASTRODUODENOSCOPY (EGD) WITH PROPOFOL;  Surgeon: Manya Silvas, MD;  Location: [facility] ENDOSCOPY;  Service: Endoscopy;  Laterality: N/A;  . FRACTURE SURGERY    . history of helicobacter pylori infection    . JOINT REPLACEMENT    . LEFT OOPHORECTOMY    . LEG SURGERY Left   . ORIF ANKLE FRACTURE  2012  . OVARIAN CYST REMOVAL  right  . RECTOCELE REPAIR N/A   . REPLACEMENT TOTAL KNEE Right 03/19/2016  . TONSILECTOMY/ADENOIDECTOMY WITH MYRINGOTOMY  1970  . TONSILLECTOMY N/A   . TUBAL LIGATION  1976  . VAGINAL HYSTERECTOMY  1980's   secondary to bleeding    Family History  Problem Relation Age of Onset  . Ulcers Mother   . Ulcers Father   . Breast cancer Other        great aunt and grandfather  . Lung cancer Other        uncle  . Hypertension Maternal Grandmother   . Heart disease Maternal Grandmother        MI 67s  . Diabetes Maternal Grandmother   . Arthritis Maternal Grandmother   .  Diabetes Maternal Grandfather     SOCIAL HX: reviewed.    Current Outpatient Medications:  .  aspirin EC 81 MG tablet, Take by mouth., Disp: , Rfl:  .  azelastine (ASTELIN) 0.1 % nasal spray, ONER SPRAY IN EACH NOSTRIL TWICE DAILY AS DIRECTED, Disp: 30 mL, Rfl: 1 .  calcium carbonate (TUMS - DOSED IN MG ELEMENTAL CALCIUM) 500 MG chewable tablet, Chew 1 tablet by mouth as needed., Disp: , Rfl:  .  cetirizine (ZYRTEC) 10 MG tablet, Take by mouth., Disp: , Rfl:  .  Diphenhyd-Hydrocort-Nystatin (FIRST-DUKES MOUTHWASH) SUSP, Swish and spit 5 mLs tid prn, Disp: 37 mL, Rfl: 0 .  fentaNYL (DURAGESIC) 100 MCG/HR, Apply 1 patch to skin every 2 days with fentanyl patch 25 g per hour if tolerated, Disp: 15 patch, Rfl: 0 .  fentaNYL (DURAGESIC) 25 MCG/HR patch, Apply 1 patch to skin every 2 days with fentanyl patch 100 g per hour if tolerated, Disp: 15 patch, Rfl: 0 .  fluticasone (FLONASE) 50 MCG/ACT nasal spray, Place 2 sprays into both nostrils daily., Disp: 48 g, Rfl: 3 .  furosemide (LASIX) 20 MG tablet, TAKE ONE TABLET BY MOUTH EVERY DAY AS NEEDED, Disp: 90 tablet, Rfl: 1 .  mupirocin ointment (BACTROBAN) 2 %, Apply to affected area bid, Disp: 22 g, Rfl: 0 .  omeprazole (PRILOSEC) 20 MG capsule, TAKE 1 CAPSULE BY MOUTH TWICE DAILY, Disp: 180 capsule, Rfl: 2 .  oxyCODONE (ROXICODONE) 5 MG immediate release tablet, Limit 1 tab by mouth per day or twice per day if tolerated for breakthrough pain while wearing fentanyl patches, Disp: 50 tablet, Rfl: 0 .  senna-docusate (SENOKOT-S) 8.6-50 MG per tablet, Take by mouth., Disp: , Rfl:  .  valACYclovir (VALTREX) 500 MG tablet, TAKE ONE TABLET EVERY DAY, Disp: 90 tablet, Rfl: 1 .  atorvastatin (LIPITOR) 10 MG tablet, Take one tablet q Monday, Wednesday and Friday, Disp: 30 tablet, Rfl: 1 .  estradiol (ESTRACE) 0.5 MG tablet, Take 1 tablet (0.5 mg total) by mouth daily., Disp: 30 tablet, Rfl: 2  EXAM:  VITALS per patient if applicable:  161/09  GENERAL:  alert.  Answering questions appropriately.  Sounds to be in no acute distress.    PSYCH/NEURO: pleasant and cooperative, no obvious depression or anxiety, speech and thought processing grossly intact  ASSESSMENT AND PLAN:  Discussed the following assessment and plan:  Anemia Has a history of iron deficiency and B12 deficiency.  Follow cbc.  Has had colonoscopy and EGD    Chronic back pain Followed by pain clinic.  Stable.   GERD (gastroesophageal reflux disease) Controlled on current regimen.  Follow.    Hypercholesterolemia Discussed calculated cholesterol risk.  Discussed cholesterol lab values.  Discussed treatment options.  Has tried  crestor and pravastatin - intolerant.  Wants to try lipitor.  Husband takes.  Will start with 10mg  q Monday, Wednesday and Friday.  Follow lipid panel and liver function tests.    Lymphoma Followed by Dr Leretha Pol.  Just had f/u CT.  Ok.  Recommended f/u in one year.    Night sweats Describes night sweats and sweats during day.  Some hot flashes.  She discussed with oncology.  CT ok.  Discussed treatment options.  She prefers to be placed back on estrogen.  Discussed risk and possible side effects of estrogen.  Desires low dose.  Start estrace .5mg  q day.  Follow.      I discussed the assessment and treatment plan with the patient. The patient was provided an opportunity to ask questions and all were answered. The patient agreed with the plan and demonstrated an understanding of the instructions.   The patient was advised to call back or seek an in-person evaluation if the symptoms worsen or if the condition fails to improve as anticipated.  I provided 28 minutes of non-face-to-face time during this encounter.   Einar Pheasant, MD

## 2018-09-07 ENCOUNTER — Encounter: Payer: Self-pay | Admitting: Internal Medicine

## 2018-09-07 NOTE — Assessment & Plan Note (Signed)
Describes night sweats and sweats during day.  Some hot flashes.  She discussed with oncology.  CT ok.  Discussed treatment options.  She prefers to be placed back on estrogen.  Discussed risk and possible side effects of estrogen.  Desires low dose.  Start estrace .5mg  q day.  Follow.

## 2018-09-07 NOTE — Assessment & Plan Note (Signed)
Has a history of iron deficiency and B12 deficiency.  Follow cbc.  Has had colonoscopy and EGD

## 2018-09-07 NOTE — Assessment & Plan Note (Signed)
Discussed calculated cholesterol risk.  Discussed cholesterol lab values.  Discussed treatment options.  Has tried crestor and pravastatin - intolerant.  Wants to try lipitor.  Husband takes.  Will start with 10mg  q Monday, Wednesday and Friday.  Follow lipid panel and liver function tests.

## 2018-09-07 NOTE — Assessment & Plan Note (Signed)
Followed by Dr Leretha Pol.  Just had f/u CT.  Ok.  Recommended f/u in one year.

## 2018-09-07 NOTE — Assessment & Plan Note (Signed)
Controlled on current regimen.  Follow.  

## 2018-09-07 NOTE — Assessment & Plan Note (Signed)
Followed by pain clinic.  Stable.  

## 2018-09-15 DIAGNOSIS — B0229 Other postherpetic nervous system involvement: Secondary | ICD-10-CM | POA: Diagnosis not present

## 2018-09-15 DIAGNOSIS — Z5181 Encounter for therapeutic drug level monitoring: Secondary | ICD-10-CM | POA: Diagnosis not present

## 2018-09-15 DIAGNOSIS — C8590 Non-Hodgkin lymphoma, unspecified, unspecified site: Secondary | ICD-10-CM | POA: Diagnosis not present

## 2018-09-15 DIAGNOSIS — Z96651 Presence of right artificial knee joint: Secondary | ICD-10-CM | POA: Diagnosis not present

## 2018-09-16 ENCOUNTER — Telehealth: Payer: Self-pay

## 2018-09-16 NOTE — Telephone Encounter (Signed)
Copied from [place] [id number]. Topic: General - Inquiry >> Sep 16, 2018 11:23 AM Maria Keller, NT wrote: Reason for CRM: Patient called in stating she has not been contacting in regards to an appointment for a mammogram at [facility]. Please advise. Call back is [phone].

## 2018-09-30 NOTE — Telephone Encounter (Signed)
Pt has called several times about scheduling for mammogram with [facility] and has not heard anything back yet from the office. Please advise asap

## 2018-10-02 NOTE — Telephone Encounter (Signed)
Order has been faxed to [facility]

## 2018-10-06 ENCOUNTER — Other Ambulatory Visit: Payer: Self-pay | Admitting: Internal Medicine

## 2018-10-14 DIAGNOSIS — M792 Neuralgia and neuritis, unspecified: Secondary | ICD-10-CM | POA: Diagnosis not present

## 2018-10-14 DIAGNOSIS — B0229 Other postherpetic nervous system involvement: Secondary | ICD-10-CM | POA: Diagnosis not present

## 2018-10-14 DIAGNOSIS — M48062 Spinal stenosis, lumbar region with neurogenic claudication: Secondary | ICD-10-CM | POA: Diagnosis not present

## 2018-10-14 DIAGNOSIS — C8590 Non-Hodgkin lymphoma, unspecified, unspecified site: Secondary | ICD-10-CM | POA: Diagnosis not present

## 2018-10-14 DIAGNOSIS — G8929 Other chronic pain: Secondary | ICD-10-CM | POA: Diagnosis not present

## 2018-10-14 DIAGNOSIS — Z5181 Encounter for therapeutic drug level monitoring: Secondary | ICD-10-CM | POA: Diagnosis not present

## 2018-10-14 DIAGNOSIS — M4726 Other spondylosis with radiculopathy, lumbar region: Secondary | ICD-10-CM | POA: Diagnosis not present

## 2018-10-14 DIAGNOSIS — M47897 Other spondylosis, lumbosacral region: Secondary | ICD-10-CM | POA: Diagnosis not present

## 2018-10-14 DIAGNOSIS — Z96651 Presence of right artificial knee joint: Secondary | ICD-10-CM | POA: Diagnosis not present

## 2018-10-14 DIAGNOSIS — M4807 Spinal stenosis, lumbosacral region: Secondary | ICD-10-CM | POA: Diagnosis not present

## 2018-10-14 DIAGNOSIS — M9951 Intervertebral disc stenosis of neural canal of cervical region: Secondary | ICD-10-CM | POA: Diagnosis not present

## 2018-10-14 DIAGNOSIS — M25559 Pain in unspecified hip: Secondary | ICD-10-CM | POA: Diagnosis not present

## 2018-10-16 ENCOUNTER — Other Ambulatory Visit (INDEPENDENT_AMBULATORY_CARE_PROVIDER_SITE_OTHER): Payer: Medicare Other

## 2018-10-16 ENCOUNTER — Other Ambulatory Visit: Payer: Self-pay

## 2018-10-16 DIAGNOSIS — E7800 Pure hypercholesterolemia, unspecified: Secondary | ICD-10-CM

## 2018-10-16 LAB — HEPATIC FUNCTION PANEL
ALT: 9 U/L (ref 0–35)
AST: 15 U/L (ref 0–37)
Albumin: 4.3 g/dL (ref 3.5–5.2)
Alkaline Phosphatase: 68 U/L (ref 39–117)
Bilirubin, Direct: 0.1 mg/dL (ref 0.0–0.3)
Total Bilirubin: 0.4 mg/dL (ref 0.2–1.2)
Total Protein: 7 g/dL (ref 6.0–8.3)

## 2018-10-28 ENCOUNTER — Other Ambulatory Visit: Payer: Self-pay

## 2018-10-28 ENCOUNTER — Encounter: Payer: Self-pay | Admitting: Internal Medicine

## 2018-10-28 ENCOUNTER — Ambulatory Visit (INDEPENDENT_AMBULATORY_CARE_PROVIDER_SITE_OTHER): Payer: Medicare Other | Admitting: Internal Medicine

## 2018-10-28 VITALS — BP 122/62 | HR 93 | Temp 96.6°F | Ht 67.0 in | Wt 165.8 lb

## 2018-10-28 DIAGNOSIS — E538 Deficiency of other specified B group vitamins: Secondary | ICD-10-CM | POA: Diagnosis not present

## 2018-10-28 DIAGNOSIS — L989 Disorder of the skin and subcutaneous tissue, unspecified: Secondary | ICD-10-CM

## 2018-10-28 DIAGNOSIS — Z0000 Encounter for general adult medical examination without abnormal findings: Secondary | ICD-10-CM | POA: Diagnosis not present

## 2018-10-28 DIAGNOSIS — M545 Low back pain: Secondary | ICD-10-CM | POA: Diagnosis not present

## 2018-10-28 DIAGNOSIS — M4696 Unspecified inflammatory spondylopathy, lumbar region: Secondary | ICD-10-CM

## 2018-10-28 DIAGNOSIS — Z8579 Personal history of other malignant neoplasms of lymphoid, hematopoietic and related tissues: Secondary | ICD-10-CM

## 2018-10-28 DIAGNOSIS — D649 Anemia, unspecified: Secondary | ICD-10-CM | POA: Diagnosis not present

## 2018-10-28 DIAGNOSIS — R739 Hyperglycemia, unspecified: Secondary | ICD-10-CM

## 2018-10-28 DIAGNOSIS — K219 Gastro-esophageal reflux disease without esophagitis: Secondary | ICD-10-CM

## 2018-10-28 DIAGNOSIS — M797 Fibromyalgia: Secondary | ICD-10-CM

## 2018-10-28 DIAGNOSIS — Z23 Encounter for immunization: Secondary | ICD-10-CM | POA: Diagnosis not present

## 2018-10-28 DIAGNOSIS — E7800 Pure hypercholesterolemia, unspecified: Secondary | ICD-10-CM

## 2018-10-28 DIAGNOSIS — G8929 Other chronic pain: Secondary | ICD-10-CM

## 2018-10-28 NOTE — Progress Notes (Signed)
Patient ID: Maria Keller, female   DOB: Oct 07, 1950, 68 y.o.   MRN: [id number]   Subjective:    Patient ID: Maria Keller, female    DOB: 24-Jul-1950, 68 y.o.   MRN: [id number]  HPI  Patient with past history of hypercholesterolemia, follicular lymphoma, anemia, chronic pain and GERD.  She comes in today to follow up on these issues as well as for a complete physical exam.  She reports she is doing relatively well.  Trying to stay as active as possible. No chest pain.  No sob.  No acid reflux reported. Bowels moving.  Had colonoscopy 06/2017.   Last visit, started estrace for increased hot flashes.  Was affecting her quality of life.  Feels better on low dose estrace.  Hot flashes better.  She is also tolerating lipitor three days per week.  Persistent raised skin lesion right buttock. Request f/u appt with Dr Nehemiah Massed.     Past Medical History:  Diagnosis Date  . Anemia    iron deficiency and B12 deficiency  . Arthritis   . Bronchitis 08/2015  . Chronic back pain    followed by Dr Primus Bravo  . Depression   . Diverticulosis   . Esophageal stricture    requiring dilatation x 2  . Fibromyalgia   . Fibromyalgia   . Fibromyalgia   . Follicular lymphoma ([place])    Followed by Dr Leretha Pol, s/p chemo and XRT  . Follicular lymphoma ([place])   . GERD (gastroesophageal reflux disease)   . GERD (gastroesophageal reflux disease)   . Hemorrhoid   . Hiatal hernia   . History of kidney stones   . Hypercholesterolemia   . IBS (irritable bowel syndrome)   . IBS (irritable bowel syndrome)   . Nephrolithiasis    followed by Dr Bernardo Heater, s/p stents  . PONV (postoperative nausea and vomiting)   . Shingles outbreak 11/26/2011  . Sleep apnea    Past Surgical History:  Procedure Laterality Date  . APPENDECTOMY    . BLADDER SURGERY  1993  . BREAST BIOPSY N/A   . capsule endoscopy    . CHOLECYSTECTOMY  1995  . COLONOSCOPY WITH PROPOFOL N/A 06/19/2017   Procedure: COLONOSCOPY WITH PROPOFOL;  Surgeon:  Manya Silvas, MD;  [facility];  Service: Endoscopy;  Laterality: N/A;  . ESOPHAGOGASTRODUODENOSCOPY (EGD) WITH PROPOFOL N/A 06/19/2017   Procedure: ESOPHAGOGASTRODUODENOSCOPY (EGD) WITH PROPOFOL;  Surgeon: Manya Silvas, MD;  Location: [facility] ENDOSCOPY;  Service: Endoscopy;  Laterality: N/A;  . FRACTURE SURGERY    . history of helicobacter pylori infection    . JOINT REPLACEMENT    . LEFT OOPHORECTOMY    . LEG SURGERY Left   . ORIF ANKLE FRACTURE  2012  . OVARIAN CYST REMOVAL     right  . RECTOCELE REPAIR N/A   . REPLACEMENT TOTAL KNEE Right 03/19/2016  . TONSILECTOMY/ADENOIDECTOMY WITH MYRINGOTOMY  1970  . TONSILLECTOMY N/A   . TUBAL LIGATION  1976  . VAGINAL HYSTERECTOMY  1980's   secondary to bleeding   Family History  Problem Relation Age of Onset  . Ulcers Mother   . Ulcers Father   . Breast cancer Other        great aunt and grandfather  . Lung cancer Other        uncle  . Hypertension Maternal Grandmother   . Heart disease Maternal Grandmother        MI 23s  . Diabetes Maternal Grandmother   . Arthritis Maternal  Grandmother   . Diabetes Maternal Grandfather    Social History   Socioeconomic History  . Marital status: Married    Spouse name: Not on file  . Number of children: Not on file  . Years of education: Not on file  . Highest education level: Not on file  Occupational History  . Not on file  Social Needs  . Financial resource strain: Not hard at all  . Food insecurity    Worry: Never true    Inability: Never true  . Transportation needs    Medical: No    Non-medical: No  Tobacco Use  . Smoking status: Never Smoker  . Smokeless tobacco: Never Used  Substance and Sexual Activity  . Alcohol use: No    Alcohol/week: 0.0 standard drinks  . Drug use: No  . Sexual activity: Yes  Lifestyle  . Physical activity    Days per week: 1 day    Minutes per session: 30 min  . Stress: Not at all  Relationships  . Social Product manager on phone: Not on file    Gets together: Not on file    Attends religious service: Not on file    Active member of club or organization: Not on file    Attends meetings of clubs or organizations: Not on file    Relationship status: Not on file  Other Topics Concern  . Not on file  Social History Narrative  . Not on file    Outpatient Encounter Medications as of 10/28/2018  Medication Sig  . aspirin EC 81 MG tablet Take by mouth.  Marland Kitchen atorvastatin (LIPITOR) 10 MG tablet Take one tablet q Monday, Wednesday and Friday  . azelastine (ASTELIN) 0.1 % nasal spray ONER SPRAY IN EACH NOSTRIL TWICE DAILY AS DIRECTED  . cetirizine (ZYRTEC) 10 MG tablet Take by mouth.  . Diphenhyd-Hydrocort-Nystatin (FIRST-DUKES MOUTHWASH) SUSP Swish and spit 5 mLs tid prn  . estradiol (ESTRACE) 0.5 MG tablet Take 1 tablet (0.5 mg total) by mouth daily.  . fentaNYL (DURAGESIC) 100 MCG/HR Apply 1 patch to skin every 2 days with fentanyl patch 25 g per hour if tolerated  . fentaNYL (DURAGESIC) 25 MCG/HR patch Apply 1 patch to skin every 2 days with fentanyl patch 100 g per hour if tolerated  . fluticasone (FLONASE) 50 MCG/ACT nasal spray Place 2 sprays into both nostrils daily.  . furosemide (LASIX) 20 MG tablet TAKE ONE TABLET BY MOUTH EVERY DAY AS NEEDED  . mupirocin ointment (BACTROBAN) 2 % Apply to affected area bid  . omeprazole (PRILOSEC) 20 MG capsule TAKE 1 CAPSULE BY MOUTH TWICE DAILY  . oxyCODONE (ROXICODONE) 5 MG immediate release tablet Limit 1 tab by mouth per day or twice per day if tolerated for breakthrough pain while wearing fentanyl patches  . senna-docusate (SENOKOT-S) 8.6-50 MG per tablet Take by mouth.  . calcium carbonate (TUMS - DOSED IN MG ELEMENTAL CALCIUM) 500 MG chewable tablet Chew 1 tablet by mouth as needed.  . [DISCONTINUED] valACYclovir (VALTREX) 500 MG tablet TAKE ONE TABLET EVERY DAY (Patient not taking: Reported on 10/28/2018)   No facility-administered encounter medications on  file as of 10/28/2018.    Review of Systems  Constitutional: Negative for appetite change and unexpected weight change.  HENT: Negative for congestion and sinus pressure.   Eyes: Negative for pain and visual disturbance.  Respiratory: Negative for cough, chest tightness and shortness of breath.   Cardiovascular: Negative for chest pain, palpitations and leg  swelling.  Gastrointestinal: Negative for abdominal pain, diarrhea, nausea and vomiting.  Genitourinary: Negative for difficulty urinating and dysuria.  Musculoskeletal: Negative for joint swelling and myalgias.  Skin: Negative for color change and rash.  Neurological: Negative for dizziness, light-headedness and headaches.  Hematological: Negative for adenopathy. Does not bruise/bleed easily.  Psychiatric/Behavioral: Negative for agitation and dysphoric mood.       Objective:    Physical Exam Constitutional:      General: She is not in acute distress.    Appearance: Normal appearance. She is well-developed.  HENT:     Right Ear: External ear normal.     Left Ear: External ear normal.  Eyes:     General: No scleral icterus.       Right eye: No discharge.        Left eye: No discharge.     Conjunctiva/sclera: Conjunctivae normal.  Neck:     Musculoskeletal: Neck supple. No muscular tenderness.     Thyroid: No thyromegaly.  Cardiovascular:     Rate and Rhythm: Normal rate and regular rhythm.  Pulmonary:     Effort: No tachypnea, accessory muscle usage or respiratory distress.     Breath sounds: Normal breath sounds. No decreased breath sounds or wheezing.  Chest:     Breasts:        Right: No inverted nipple, mass, nipple discharge or tenderness (no axillary adenopathy).        Left: No inverted nipple, mass, nipple discharge or tenderness (no axilarry adenopathy).  Abdominal:     General: Bowel sounds are normal.     Palpations: Abdomen is soft.     Tenderness: There is no abdominal tenderness.  Musculoskeletal:         General: No swelling or tenderness.  Lymphadenopathy:     Cervical: No cervical adenopathy.  Skin:    Findings: No erythema or rash.  Neurological:     Mental Status: She is alert and oriented to person, place, and time.  Psychiatric:        Mood and Affect: Mood normal.        Behavior: Behavior normal.     BP 122/62   Pulse 93   Temp (!) 96.6 F (35.9 C)   Ht 5\' 7"  (1.702 m)   Wt 165 lb 12.8 oz (75.2 kg)   SpO2 97%   BMI 25.97 kg/m  Wt Readings from Last 3 Encounters:  10/28/18 165 lb 12.8 oz (75.2 kg)  12/27/17 166 lb 12.8 oz (75.7 kg)  11/26/17 168 lb (76.2 kg)     Lab Results  Component Value Date   WBC 9.4 11/26/2017   HGB 13.2 11/26/2017   HCT 39.9 11/26/2017   PLT 256.0 11/26/2017   GLUCOSE 107 (H) 09/02/2018   CHOL 261 (H) 09/02/2018   TRIG 118.0 09/02/2018   HDL 55.70 09/02/2018   LDLDIRECT 164.2 02/11/2013   LDLCALC 182 (H) 09/02/2018   ALT 9 10/16/2018   AST 15 10/16/2018   NA 141 09/02/2018   K 4.5 09/02/2018   CL 101 09/02/2018   CREATININE 0.79 09/02/2018   BUN 12 09/02/2018   CO2 33 (H) 09/02/2018   TSH 0.54 11/26/2017   HGBA1C 5.6 09/02/2018    Mr Brain W Wo Contrast  Result Date: 05/14/2017 CLINICAL DATA:  Headaches for 2 months.  Non intractable headache. EXAM: MRI HEAD WITHOUT AND WITH CONTRAST TECHNIQUE: Multiplanar, multiecho pulse sequences of the brain and surrounding structures were obtained without and with intravenous contrast.  CONTRAST:  39mL MULTIHANCE GADOBENATE DIMEGLUMINE 529 MG/ML IV SOLN COMPARISON:  None. FINDINGS: Brain: Periventricular and subcortical T2 hyperintensities bilaterally are mildly advanced for age. No acute infarct, hemorrhage, or mass lesion is present. Ventricles are of normal size. Insert pass fluid The internal auditory canals are within normal limits bilaterally. The brainstem and cerebellum are normal. Vascular: Flow is present in the major intracranial arteries. Skull and upper cervical spine: The skull  base is within normal limits. Craniocervical junction is normal. The upper cervical spine is within normal limits. Sinuses/Orbits: Paranasal sinuses and mastoid air cells are clear. Bilateral lens replacements are present. Globes and orbits are within limits. Other: None. IMPRESSION: 1. Periventricular and subcortical T2 hyperintensities bilaterally are mildly advanced for age. The finding is nonspecific but can be seen in the setting of chronic microvascular ischemia, a demyelinating process such as multiple sclerosis, vasculitis, complicated migraine headaches, or as the sequelae of a prior infectious or inflammatory process. 2. No acute or focal abnormality to explain headaches otherwise. Electronically Signed   By: San Morelle M.D.   On: 05/14/2017 10:13       Assessment & Plan:   Problem List Items Addressed This Visit    Anemia    Has had colonoscopy and EGD.  Has a history of iron deficiency and B12 def.  Follow cbc.        Relevant Orders   CBC with Differential/Platelet   Ferritin   Chronic back pain    Followed by pain clinic.  Stable.        Fibromyalgia    Followed by pain clinic.  Stable.       GERD (gastroesophageal reflux disease)    Controlled.  No upper symptoms reported.        Health care maintenance    Physical today 10/28/18.  S/p hysterectomy.  Mammogram due.        History of lymphoma    Followed by Dr Leretha Pol.  Just had f/u CT 03/2018.  Recommended f/u in one year.       Hypercholesterolemia    Tolerating lipitor 3 days per week.  Low cholesterol diet and exercise.  Follow lipid panel and liver function tests.        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  Stable.       Skin lesion    Raised. Right buttock.  Refer to dermatology.  Starting to bother her when she sits.        Relevant Orders   Ambulatory  referral to Dermatology    Other Visit Diagnoses    Hyperglycemia    -  Primary   Relevant Orders   Hemoglobin A1c   Need for immunization against influenza       Relevant Orders   Flu Vaccine QUAD High Dose(Fluad) (Completed)   B12 deficiency       Relevant Orders   Vitamin B12       Einar Pheasant, MD

## 2018-10-28 NOTE — Assessment & Plan Note (Signed)
Physical today 10/28/18.  S/p hysterectomy.  Mammogram due.

## 2018-11-02 ENCOUNTER — Encounter: Payer: Self-pay | Admitting: Internal Medicine

## 2018-11-02 DIAGNOSIS — L989 Disorder of the skin and subcutaneous tissue, unspecified: Secondary | ICD-10-CM | POA: Insufficient documentation

## 2018-11-02 NOTE — Assessment & Plan Note (Signed)
Followed by pain clinic.  Stable.  

## 2018-11-02 NOTE — Assessment & Plan Note (Signed)
Tolerating lipitor 3 days per week.  Low cholesterol diet and exercise.  Follow lipid panel and liver function tests.

## 2018-11-02 NOTE — Assessment & Plan Note (Signed)
Controlled.  No upper symptoms reported.   

## 2018-11-02 NOTE — Assessment & Plan Note (Signed)
Raised. Right buttock.  Refer to dermatology.  Starting to bother her when she sits.

## 2018-11-02 NOTE — Assessment & Plan Note (Signed)
Has had colonoscopy and EGD.  Has a history of iron deficiency and B12 def.  Follow cbc.

## 2018-11-02 NOTE — Assessment & Plan Note (Signed)
Followed by Dr Leretha Pol.  Just had f/u CT 03/2018.  Recommended f/u in one year.

## 2018-11-04 ENCOUNTER — Other Ambulatory Visit: Payer: Self-pay | Admitting: Internal Medicine

## 2018-11-10 DIAGNOSIS — C8590 Non-Hodgkin lymphoma, unspecified, unspecified site: Secondary | ICD-10-CM | POA: Diagnosis not present

## 2018-11-10 DIAGNOSIS — Z5181 Encounter for therapeutic drug level monitoring: Secondary | ICD-10-CM | POA: Diagnosis not present

## 2018-11-10 DIAGNOSIS — B0229 Other postherpetic nervous system involvement: Secondary | ICD-10-CM | POA: Diagnosis not present

## 2018-11-10 DIAGNOSIS — Z96651 Presence of right artificial knee joint: Secondary | ICD-10-CM | POA: Diagnosis not present

## 2018-11-11 DIAGNOSIS — Z1231 Encounter for screening mammogram for malignant neoplasm of breast: Secondary | ICD-10-CM | POA: Diagnosis not present

## 2018-12-08 DIAGNOSIS — M545 Low back pain: Secondary | ICD-10-CM | POA: Diagnosis not present

## 2018-12-08 DIAGNOSIS — M5030 Other cervical disc degeneration, unspecified cervical region: Secondary | ICD-10-CM | POA: Diagnosis not present

## 2018-12-08 DIAGNOSIS — M5136 Other intervertebral disc degeneration, lumbar region: Secondary | ICD-10-CM | POA: Diagnosis not present

## 2018-12-08 DIAGNOSIS — G894 Chronic pain syndrome: Secondary | ICD-10-CM | POA: Diagnosis not present

## 2018-12-11 DIAGNOSIS — L304 Erythema intertrigo: Secondary | ICD-10-CM | POA: Diagnosis not present

## 2018-12-11 DIAGNOSIS — D225 Melanocytic nevi of trunk: Secondary | ICD-10-CM | POA: Diagnosis not present

## 2018-12-23 ENCOUNTER — Other Ambulatory Visit: Payer: Self-pay

## 2018-12-23 DIAGNOSIS — D485 Neoplasm of uncertain behavior of skin: Secondary | ICD-10-CM | POA: Diagnosis not present

## 2018-12-23 DIAGNOSIS — D171 Benign lipomatous neoplasm of skin and subcutaneous tissue of trunk: Secondary | ICD-10-CM | POA: Diagnosis not present

## 2018-12-25 ENCOUNTER — Other Ambulatory Visit: Payer: Self-pay

## 2018-12-25 ENCOUNTER — Other Ambulatory Visit (INDEPENDENT_AMBULATORY_CARE_PROVIDER_SITE_OTHER): Payer: Medicare Other

## 2018-12-25 DIAGNOSIS — D649 Anemia, unspecified: Secondary | ICD-10-CM | POA: Diagnosis not present

## 2018-12-25 DIAGNOSIS — E538 Deficiency of other specified B group vitamins: Secondary | ICD-10-CM

## 2018-12-25 DIAGNOSIS — R739 Hyperglycemia, unspecified: Secondary | ICD-10-CM | POA: Diagnosis not present

## 2018-12-25 DIAGNOSIS — E7800 Pure hypercholesterolemia, unspecified: Secondary | ICD-10-CM | POA: Diagnosis not present

## 2018-12-25 LAB — LIPID PANEL
Cholesterol: 221 mg/dL — ABNORMAL HIGH (ref 0–200)
HDL: 53.4 mg/dL
LDL Cholesterol: 143 mg/dL — ABNORMAL HIGH (ref 0–99)
NonHDL: 167.91
Total CHOL/HDL Ratio: 4
Triglycerides: 127 mg/dL (ref 0.0–149.0)
VLDL: 25.4 mg/dL (ref 0.0–40.0)

## 2018-12-25 LAB — FERRITIN: Ferritin: 12.4 ng/mL (ref 10.0–291.0)

## 2018-12-25 LAB — BASIC METABOLIC PANEL
BUN: 13 mg/dL (ref 6–23)
CO2: 28 mEq/L (ref 19–32)
Calcium: 8.9 mg/dL (ref 8.4–10.5)
Chloride: 104 mEq/L (ref 96–112)
Creatinine, Ser: 0.68 mg/dL (ref 0.40–1.20)
GFR: 85.52 mL/min (ref >60.00)
Glucose, Bld: 105 mg/dL — ABNORMAL HIGH (ref 70–99)
Potassium: 5 mEq/L (ref 3.5–5.1)
Sodium: 141 mEq/L (ref 135–145)

## 2018-12-25 LAB — HEPATIC FUNCTION PANEL
ALT: 10 U/L (ref 0–35)
AST: 15 U/L (ref 0–37)
Albumin: 4.1 g/dL (ref 3.5–5.2)
Alkaline Phosphatase: 83 U/L (ref 39–117)
Bilirubin, Direct: 0.1 mg/dL (ref 0.0–0.3)
Total Bilirubin: 0.3 mg/dL (ref 0.2–1.2)
Total Protein: 6.5 g/dL (ref 6.0–8.3)

## 2018-12-25 LAB — CBC WITH DIFFERENTIAL/PLATELET
Basophils Absolute: 0 10*3/uL (ref 0.0–0.1)
Basophils Relative: 0.6 % (ref 0.0–3.0)
Eosinophils Absolute: 0.1 10*3/uL (ref 0.0–0.7)
Eosinophils Relative: 1 % (ref 0.0–5.0)
HCT: 38 % (ref 36.0–46.0)
Hemoglobin: 12.3 g/dL (ref 12.0–15.0)
Lymphocytes Relative: 32.1 % (ref 12.0–46.0)
Lymphs Abs: 2.2 10*3/uL (ref 0.7–4.0)
MCHC: 32.4 g/dL (ref 30.0–36.0)
MCV: 81.7 fl (ref 78.0–100.0)
Monocytes Absolute: 0.4 10*3/uL (ref 0.1–1.0)
Monocytes Relative: 6.1 % (ref 3.0–12.0)
Neutro Abs: 4.1 10*3/uL (ref 1.4–7.7)
Neutrophils Relative %: 60.2 % (ref 43.0–77.0)
Platelets: 211 10*3/uL (ref 150.0–400.0)
RBC: 4.65 Mil/uL (ref 3.87–5.11)
RDW: 14.9 % (ref 11.5–15.5)
WBC: 6.8 10*3/uL (ref 4.0–10.5)

## 2018-12-25 LAB — VITAMIN B12: Vitamin B-12: 177 pg/mL — ABNORMAL LOW (ref 211–911)

## 2018-12-25 LAB — HEMOGLOBIN A1C: Hgb A1c MFr Bld: 5.7 % (ref 4.6–6.5)

## 2018-12-25 LAB — TSH: TSH: 1.36 u[IU]/mL (ref 0.35–4.50)

## 2018-12-30 ENCOUNTER — Other Ambulatory Visit: Payer: Self-pay

## 2018-12-30 DIAGNOSIS — Z4802 Encounter for removal of sutures: Secondary | ICD-10-CM | POA: Diagnosis not present

## 2018-12-30 DIAGNOSIS — D171 Benign lipomatous neoplasm of skin and subcutaneous tissue of trunk: Secondary | ICD-10-CM | POA: Diagnosis not present

## 2019-01-05 DIAGNOSIS — M5136 Other intervertebral disc degeneration, lumbar region: Secondary | ICD-10-CM | POA: Diagnosis not present

## 2019-01-05 DIAGNOSIS — M5030 Other cervical disc degeneration, unspecified cervical region: Secondary | ICD-10-CM | POA: Diagnosis not present

## 2019-01-05 DIAGNOSIS — M542 Cervicalgia: Secondary | ICD-10-CM | POA: Diagnosis not present

## 2019-01-05 DIAGNOSIS — G894 Chronic pain syndrome: Secondary | ICD-10-CM | POA: Diagnosis not present

## 2019-01-07 ENCOUNTER — Other Ambulatory Visit: Payer: Self-pay

## 2019-01-07 ENCOUNTER — Ambulatory Visit (INDEPENDENT_AMBULATORY_CARE_PROVIDER_SITE_OTHER): Payer: Medicare Other | Admitting: *Deleted

## 2019-01-07 DIAGNOSIS — E538 Deficiency of other specified B group vitamins: Secondary | ICD-10-CM | POA: Diagnosis not present

## 2019-01-07 MED ORDER — CYANOCOBALAMIN 1000 MCG/ML IJ SOLN
1000.0000 ug | Freq: Once | INTRAMUSCULAR | Status: AC
  Administered 2019-01-07: 1000 ug via INTRAMUSCULAR

## 2019-01-07 NOTE — Progress Notes (Addendum)
Patient presented for B 12 injection to left deltoid, patient voiced no concerns nor showed any signs of distress during injection.  Reviewed.  Dr Scott 

## 2019-01-12 DIAGNOSIS — M8949 Other hypertrophic osteoarthropathy, multiple sites: Secondary | ICD-10-CM | POA: Diagnosis not present

## 2019-01-13 ENCOUNTER — Telehealth: Payer: Self-pay | Admitting: Internal Medicine

## 2019-01-14 ENCOUNTER — Ambulatory Visit (INDEPENDENT_AMBULATORY_CARE_PROVIDER_SITE_OTHER): Payer: Medicare Other | Admitting: *Deleted

## 2019-01-14 ENCOUNTER — Other Ambulatory Visit: Payer: Self-pay

## 2019-01-14 DIAGNOSIS — E538 Deficiency of other specified B group vitamins: Secondary | ICD-10-CM | POA: Diagnosis not present

## 2019-01-14 MED ORDER — CYANOCOBALAMIN 1000 MCG/ML IJ SOLN
1000.0000 ug | Freq: Once | INTRAMUSCULAR | Status: AC
  Administered 2019-01-14: 17:00:00 1000 ug via INTRAMUSCULAR

## 2019-01-14 NOTE — Progress Notes (Addendum)
Patient presented for B 12 injection to right deltoid, patient voiced no concerns nor showed any signs of distress during injection.  Reviewed.  Dr Scott 

## 2019-01-19 NOTE — Telephone Encounter (Signed)
Pt states that the doctor increased her to taking this five times a week, but the pharmacist will not fill yet because script is written as before and she shouldn't be out. Pt uses [facility].

## 2019-01-20 ENCOUNTER — Other Ambulatory Visit: Payer: Self-pay

## 2019-01-20 MED ORDER — ATORVASTATIN CALCIUM 10 MG PO TABS: 10.0000 mg | ORAL_TABLET | Freq: Every day | ORAL | 3 refills | Status: DC

## 2019-01-20 NOTE — Telephone Encounter (Signed)
New rx with new directions sent in. Pt aware

## 2019-01-21 ENCOUNTER — Ambulatory Visit: Payer: Medicare Other

## 2019-01-21 DIAGNOSIS — Z03818 Encounter for observation for suspected exposure to other biological agents ruled out: Secondary | ICD-10-CM | POA: Diagnosis not present

## 2019-01-28 ENCOUNTER — Ambulatory Visit: Payer: Medicare Other

## 2019-02-02 DIAGNOSIS — G894 Chronic pain syndrome: Secondary | ICD-10-CM | POA: Diagnosis not present

## 2019-02-02 DIAGNOSIS — M5030 Other cervical disc degeneration, unspecified cervical region: Secondary | ICD-10-CM | POA: Diagnosis not present

## 2019-02-02 DIAGNOSIS — M5136 Other intervertebral disc degeneration, lumbar region: Secondary | ICD-10-CM | POA: Diagnosis not present

## 2019-02-02 DIAGNOSIS — M797 Fibromyalgia: Secondary | ICD-10-CM | POA: Diagnosis not present

## 2019-03-02 DIAGNOSIS — G894 Chronic pain syndrome: Secondary | ICD-10-CM | POA: Diagnosis not present

## 2019-03-02 DIAGNOSIS — M5136 Other intervertebral disc degeneration, lumbar region: Secondary | ICD-10-CM | POA: Diagnosis not present

## 2019-03-02 DIAGNOSIS — M5030 Other cervical disc degeneration, unspecified cervical region: Secondary | ICD-10-CM | POA: Diagnosis not present

## 2019-03-02 DIAGNOSIS — M545 Low back pain: Secondary | ICD-10-CM | POA: Diagnosis not present

## 2019-03-04 ENCOUNTER — Ambulatory Visit (INDEPENDENT_AMBULATORY_CARE_PROVIDER_SITE_OTHER): Payer: Medicare Other | Admitting: Internal Medicine

## 2019-03-04 ENCOUNTER — Other Ambulatory Visit: Payer: Self-pay

## 2019-03-04 DIAGNOSIS — K219 Gastro-esophageal reflux disease without esophagitis: Secondary | ICD-10-CM

## 2019-03-04 DIAGNOSIS — E7800 Pure hypercholesterolemia, unspecified: Secondary | ICD-10-CM | POA: Diagnosis not present

## 2019-03-04 DIAGNOSIS — Z8572 Personal history of non-Hodgkin lymphomas: Secondary | ICD-10-CM

## 2019-03-04 DIAGNOSIS — M545 Low back pain: Secondary | ICD-10-CM | POA: Diagnosis not present

## 2019-03-04 DIAGNOSIS — R739 Hyperglycemia, unspecified: Secondary | ICD-10-CM

## 2019-03-04 DIAGNOSIS — D649 Anemia, unspecified: Secondary | ICD-10-CM

## 2019-03-04 DIAGNOSIS — G8929 Other chronic pain: Secondary | ICD-10-CM

## 2019-03-04 DIAGNOSIS — Z8579 Personal history of other malignant neoplasms of lymphoid, hematopoietic and related tissues: Secondary | ICD-10-CM

## 2019-03-04 NOTE — Progress Notes (Signed)
Patient ID: Maria Keller, female   DOB: 04/13/1950, 69 y.o.   MRN: [id number]   Virtual Visit via video Note  This visit type was conducted due to national recommendations for restrictions regarding the COVID-19 pandemic (e.g. social distancing).  This format is felt to be most appropriate for this patient at this time.  All issues noted in this document were discussed and addressed.  No physical exam was performed (except for noted visual exam findings with Video Visits).   I connected with Kristopher Glee by a video enabled telemedicine application and verified that I am speaking with the correct person using two identifiers. Location patient: home Location provider: work  Persons participating in the virtual visit: patient, provider  the limitations, risks, security and privacy concerns of performing an evaluation and management service by video and the availability of in person appointments have been discussed. The patient expressed understanding and agreed to proceed.   Reason for visit: scheduled follow up.   HPI: She reports she is doing relatively well.  She reports she is doing relatively well.  Trying to stay active.  No chest pain or sob reported.  Saw rheumatology 01/12/19 for f/u OA - stable.  Recommended f/u in one year.  Tolerating lipitor. Some aches, but was questioning if could be related to weather changes.  Eating.  No nausea or vomiting reported.  Chronic back pain.  Stable.  No acid reflux reported. Taking miralax and senokot - to help keep bowels moving.     ROS: See pertinent positives and negatives per HPI.  Past Medical History:  Diagnosis Date  . Anemia    iron deficiency and B12 deficiency  . Arthritis   . Bronchitis 08/2015  . Chronic back pain    followed by Dr Primus Bravo  . Depression   . Diverticulosis   . Esophageal stricture    requiring dilatation x 2  . Fibromyalgia   . Fibromyalgia   . Fibromyalgia   . Follicular lymphoma ([place])    Followed by Dr  Leretha Pol, s/p chemo and XRT  . Follicular lymphoma ([place])   . GERD (gastroesophageal reflux disease)   . GERD (gastroesophageal reflux disease)   . Hemorrhoid   . Hiatal hernia   . History of kidney stones   . Hypercholesterolemia   . IBS (irritable bowel syndrome)   . IBS (irritable bowel syndrome)   . Nephrolithiasis    followed by Dr Bernardo Heater, s/p stents  . PONV (postoperative nausea and vomiting)   . Shingles outbreak 11/26/2011  . Sleep apnea     Past Surgical History:  Procedure Laterality Date  . APPENDECTOMY    . BLADDER SURGERY  1993  . BREAST BIOPSY N/A   . capsule endoscopy    . CHOLECYSTECTOMY  1995  . COLONOSCOPY WITH PROPOFOL N/A 06/19/2017   Procedure: COLONOSCOPY WITH PROPOFOL;  Surgeon: Manya Silvas, MD;  Location: [facility] ENDOSCOPY;  Service: Endoscopy;  Laterality: N/A;  . ESOPHAGOGASTRODUODENOSCOPY (EGD) WITH PROPOFOL N/A 06/19/2017   Procedure: ESOPHAGOGASTRODUODENOSCOPY (EGD) WITH PROPOFOL;  Surgeon: Manya Silvas, MD;  Location: [facility] ENDOSCOPY;  Service: Endoscopy;  Laterality: N/A;  . FRACTURE SURGERY    . history of helicobacter pylori infection    . JOINT REPLACEMENT    . LEFT OOPHORECTOMY    . LEG SURGERY Left   . ORIF ANKLE FRACTURE  2012  . OVARIAN CYST REMOVAL     right  . RECTOCELE REPAIR N/A   . REPLACEMENT TOTAL KNEE Right 03/19/2016  .  TONSILECTOMY/ADENOIDECTOMY WITH MYRINGOTOMY  1970  . TONSILLECTOMY N/A   . TUBAL LIGATION  1976  . VAGINAL HYSTERECTOMY  1980's   secondary to bleeding    Family History  Problem Relation Age of Onset  . Ulcers Mother   . Ulcers Father   . Breast cancer Other        great aunt and grandfather  . Lung cancer Other        uncle  . Hypertension Maternal Grandmother   . Heart disease Maternal Grandmother        MI 75s  . Diabetes Maternal Grandmother   . Arthritis Maternal Grandmother   . Diabetes Maternal Grandfather     SOCIAL HX: reviewed.    Current Outpatient Medications:  .  aspirin  EC 81 MG tablet, Take by mouth., Disp: , Rfl:  .  atorvastatin (LIPITOR) 10 MG tablet, Take 1 tablet (10 mg total) by mouth daily., Disp: 30 tablet, Rfl: 3 .  azelastine (ASTELIN) 0.1 % nasal spray, ONER SPRAY IN EACH NOSTRIL TWICE DAILY AS DIRECTED, Disp: 30 mL, Rfl: 1 .  calcium carbonate (TUMS - DOSED IN MG ELEMENTAL CALCIUM) 500 MG chewable tablet, Chew 1 tablet by mouth as needed., Disp: , Rfl:  .  cetirizine (ZYRTEC) 10 MG tablet, Take by mouth., Disp: , Rfl:  .  Diphenhyd-Hydrocort-Nystatin (FIRST-DUKES MOUTHWASH) SUSP, Swish and spit 5 mLs tid prn, Disp: 37 mL, Rfl: 0 .  estradiol (ESTRACE) 0.5 MG tablet, TAKE ONE TABLET EVERY DAY, Disp: 30 tablet, Rfl: 2 .  fentaNYL (DURAGESIC) 100 MCG/HR, Apply 1 patch to skin every 2 days with fentanyl patch 25 g per hour if tolerated, Disp: 15 patch, Rfl: 0 .  fentaNYL (DURAGESIC) 25 MCG/HR patch, Apply 1 patch to skin every 2 days with fentanyl patch 100 g per hour if tolerated, Disp: 15 patch, Rfl: 0 .  fluticasone (FLONASE) 50 MCG/ACT nasal spray, Place 2 sprays into both nostrils daily., Disp: 48 g, Rfl: 3 .  furosemide (LASIX) 20 MG tablet, TAKE ONE TABLET BY MOUTH EVERY DAY AS NEEDED, Disp: 90 tablet, Rfl: 1 .  mupirocin ointment (BACTROBAN) 2 %, Apply to affected area bid, Disp: 22 g, Rfl: 0 .  omeprazole (PRILOSEC) 20 MG capsule, TAKE 1 CAPSULE BY MOUTH TWICE DAILY, Disp: 180 capsule, Rfl: 2 .  oxyCODONE (ROXICODONE) 5 MG immediate release tablet, Limit 1 tab by mouth per day or twice per day if tolerated for breakthrough pain while wearing fentanyl patches, Disp: 50 tablet, Rfl: 0 .  senna-docusate (SENOKOT-S) 8.6-50 MG per tablet, Take by mouth., Disp: , Rfl:   EXAM:  GENERAL: alert, oriented, appears well and in no acute distress  HEENT: atraumatic, conjunttiva clear, no obvious abnormalities on inspection of external nose and ears  NECK: normal movements of the head and neck  LUNGS: on inspection no signs of respiratory distress,  breathing rate appears normal, no obvious gross SOB, gasping or wheezing  CV: no obvious cyanosis  PSYCH/NEURO: pleasant and cooperative, no obvious depression or anxiety, speech and thought processing grossly intact  ASSESSMENT AND PLAN:  Discussed the following assessment and plan:  Anemia Has had colonoscopy and EGD.  H/o iron deficiency and B12 def.  Continue B12 injections.  Follow cbc.   Chronic back pain Followed by pain clinic.  Stable.    GERD (gastroesophageal reflux disease) Controlled.  No upper symptoms reported.   History of lymphoma Has been followed by Dr Leretha Pol.  Had f/u CT 03/2018.  Recommended f/u in  one year.    Hypercholesterolemia Tolerating lipitor.  Low cholesterol diet and exercise.  Follow lipid panel and liver function tests.    Hyperglycemia Low carb diet and exercise.  Follow met b and a1c.     Orders Placed This Encounter  Procedures  . Hemoglobin A1c    Standing Status:   Future    Standing Expiration Date:   03/07/2020  . Hepatic function panel    Standing Status:   Future    Standing Expiration Date:   03/07/2020  . Lipid panel    Standing Status:   Future    Standing Expiration Date:   03/07/2020  . Basic metabolic panel    Standing Status:   Future    Standing Expiration Date:   03/07/2020     I discussed the assessment and treatment plan with the patient. The patient was provided an opportunity to ask questions and all were answered. The patient agreed with the plan and demonstrated an understanding of the instructions.   The patient was advised to call back or seek an in-person evaluation if the symptoms worsen or if the condition fails to improve as anticipated.   Einar Pheasant, MD

## 2019-03-08 ENCOUNTER — Encounter: Payer: Self-pay | Admitting: Internal Medicine

## 2019-03-08 DIAGNOSIS — R739 Hyperglycemia, unspecified: Secondary | ICD-10-CM | POA: Insufficient documentation

## 2019-03-08 NOTE — Assessment & Plan Note (Signed)
Controlled.  No upper symptoms reported.   

## 2019-03-08 NOTE — Assessment & Plan Note (Signed)
Low carb diet and exercise.  Follow met b and a1c.   

## 2019-03-08 NOTE — Assessment & Plan Note (Signed)
Followed by pain clinic.  Stable.  

## 2019-03-08 NOTE — Assessment & Plan Note (Signed)
Tolerating lipitor.  Low cholesterol diet and exercise.  Follow lipid panel and liver function tests.

## 2019-03-08 NOTE — Assessment & Plan Note (Signed)
Has been followed by Dr Leretha Pol.  Had f/u CT 03/2018.  Recommended f/u in one year.

## 2019-03-08 NOTE — Assessment & Plan Note (Signed)
Has had colonoscopy and EGD.  H/o iron deficiency and B12 def.  Continue B12 injections.  Follow cbc.

## 2019-03-13 ENCOUNTER — Other Ambulatory Visit: Payer: Self-pay | Admitting: Internal Medicine

## 2019-03-19 ENCOUNTER — Other Ambulatory Visit: Payer: Self-pay

## 2019-03-19 ENCOUNTER — Ambulatory Visit (INDEPENDENT_AMBULATORY_CARE_PROVIDER_SITE_OTHER): Payer: Medicare Other | Admitting: Lab

## 2019-03-19 VITALS — Temp 96.9°F

## 2019-03-19 DIAGNOSIS — E538 Deficiency of other specified B group vitamins: Secondary | ICD-10-CM | POA: Diagnosis not present

## 2019-03-19 MED ORDER — CYANOCOBALAMIN 1000 MCG/ML IJ SOLN
1000.0000 ug | Freq: Once | INTRAMUSCULAR | Status: AC
  Administered 2019-03-19: 1000 ug via INTRAMUSCULAR

## 2019-03-19 NOTE — Progress Notes (Addendum)
Pt in office today for Vit B-12 injection in L- Deltoid. Pt tolerated well.  Reviewed.  Dr Nicki Reaper

## 2019-03-20 ENCOUNTER — Ambulatory Visit: Payer: Medicare Other | Attending: Internal Medicine

## 2019-03-20 DIAGNOSIS — Z23 Encounter for immunization: Secondary | ICD-10-CM | POA: Insufficient documentation

## 2019-03-20 NOTE — Progress Notes (Signed)
   Covid-19 Vaccination Clinic  Name:  Maria Keller    MRN: [id number] DOB: 1950-07-04  03/20/2019  [name] was observed post Covid-19 immunization for 15 minutes without incidence. She was provided with Vaccine Information Sheet and instruction to access the V-Safe system.   [name] was instructed to call 911 with any severe reactions post vaccine: Marland Kitchen Difficulty breathing  . Swelling of your face and throat  . A fast heartbeat  . A bad rash all over your body  . Dizziness and weakness    Immunizations Administered    Name Date Dose VIS Date Route   Moderna COVID-19 Vaccine 03/20/2019 12:47 PM 0.5 mL 01/06/2019 Intramuscular   Manufacturer: Moderna   Lot: [id number]   [id number]

## 2019-03-30 DIAGNOSIS — M5136 Other intervertebral disc degeneration, lumbar region: Secondary | ICD-10-CM | POA: Diagnosis not present

## 2019-03-30 DIAGNOSIS — G894 Chronic pain syndrome: Secondary | ICD-10-CM | POA: Diagnosis not present

## 2019-03-30 DIAGNOSIS — M5030 Other cervical disc degeneration, unspecified cervical region: Secondary | ICD-10-CM | POA: Diagnosis not present

## 2019-03-30 DIAGNOSIS — M542 Cervicalgia: Secondary | ICD-10-CM | POA: Diagnosis not present

## 2019-04-08 DIAGNOSIS — C8238 Follicular lymphoma grade IIIa, lymph nodes of multiple sites: Secondary | ICD-10-CM | POA: Diagnosis not present

## 2019-04-20 ENCOUNTER — Ambulatory Visit: Payer: Medicare Other | Attending: Internal Medicine

## 2019-04-20 DIAGNOSIS — Z23 Encounter for immunization: Secondary | ICD-10-CM

## 2019-04-20 NOTE — Progress Notes (Signed)
   Covid-19 Vaccination Clinic  Name:  Maria Keller    MRN: [id number] DOB: June 03, 1950  04/20/2019  Maria Keller was observed post Covid-19 immunization for 30 minutes based on pre-vaccination screening without incident. She was provided with Vaccine Information Sheet and instruction to access the V-Safe system.   Maria Keller was instructed to call 911 with any severe reactions post vaccine: Marland Kitchen Difficulty breathing  . Swelling of face and throat  . A fast heartbeat  . A bad rash all over body  . Dizziness and weakness   Immunizations Administered    Name Date Dose VIS Date Route   Moderna COVID-19 Vaccine 04/20/2019 12:21 PM 0.5 mL 01/06/2019 Intramuscular   Manufacturer: Moderna   Lot: [id number]   [id number]

## 2019-04-21 ENCOUNTER — Ambulatory Visit (INDEPENDENT_AMBULATORY_CARE_PROVIDER_SITE_OTHER): Payer: Medicare Other

## 2019-04-21 ENCOUNTER — Other Ambulatory Visit: Payer: Self-pay

## 2019-04-21 DIAGNOSIS — E538 Deficiency of other specified B group vitamins: Secondary | ICD-10-CM

## 2019-04-21 MED ORDER — CYANOCOBALAMIN 1000 MCG/ML IJ SOLN
1000.0000 ug | Freq: Once | INTRAMUSCULAR | Status: AC
  Administered 2019-04-21: 1000 ug via INTRAMUSCULAR

## 2019-04-21 NOTE — Progress Notes (Addendum)
Pt presents today for b12 injection. Right deltoid, IM. Pt tolerated injection well.   Reviewed.  Dr Nicki Reaper

## 2019-04-27 DIAGNOSIS — M542 Cervicalgia: Secondary | ICD-10-CM | POA: Diagnosis not present

## 2019-04-27 DIAGNOSIS — M5136 Other intervertebral disc degeneration, lumbar region: Secondary | ICD-10-CM | POA: Diagnosis not present

## 2019-04-27 DIAGNOSIS — M5030 Other cervical disc degeneration, unspecified cervical region: Secondary | ICD-10-CM | POA: Diagnosis not present

## 2019-04-27 DIAGNOSIS — G894 Chronic pain syndrome: Secondary | ICD-10-CM | POA: Diagnosis not present

## 2019-05-06 ENCOUNTER — Other Ambulatory Visit (INDEPENDENT_AMBULATORY_CARE_PROVIDER_SITE_OTHER): Payer: Medicare Other

## 2019-05-06 ENCOUNTER — Other Ambulatory Visit: Payer: Self-pay

## 2019-05-06 DIAGNOSIS — E7800 Pure hypercholesterolemia, unspecified: Secondary | ICD-10-CM

## 2019-05-06 DIAGNOSIS — R739 Hyperglycemia, unspecified: Secondary | ICD-10-CM

## 2019-05-06 LAB — LIPID PANEL
Cholesterol: 160 mg/dL (ref 0–200)
HDL: 52.6 mg/dL (ref >39.00)
LDL Cholesterol: 92 mg/dL (ref 0–99)
NonHDL: 106.9
Total CHOL/HDL Ratio: 3
Triglycerides: 73 mg/dL (ref 0.0–149.0)
VLDL: 14.6 mg/dL (ref 0.0–40.0)

## 2019-05-06 LAB — HEPATIC FUNCTION PANEL
ALT: 9 U/L (ref 0–35)
AST: 15 U/L (ref 0–37)
Albumin: 4 g/dL (ref 3.5–5.2)
Alkaline Phosphatase: 60 U/L (ref 39–117)
Bilirubin, Direct: 0.1 mg/dL (ref 0.0–0.3)
Total Bilirubin: 0.4 mg/dL (ref 0.2–1.2)
Total Protein: 6.5 g/dL (ref 6.0–8.3)

## 2019-05-06 LAB — BASIC METABOLIC PANEL
BUN: 12 mg/dL (ref 6–23)
CO2: 32 mEq/L (ref 19–32)
Calcium: 9 mg/dL (ref 8.4–10.5)
Chloride: 104 mEq/L (ref 96–112)
Creatinine, Ser: 0.65 mg/dL (ref 0.40–1.20)
GFR: 90 mL/min (ref >60.00)
Glucose, Bld: 98 mg/dL (ref 70–99)
Potassium: 3.8 mEq/L (ref 3.5–5.1)
Sodium: 141 mEq/L (ref 135–145)

## 2019-05-06 LAB — HEMOGLOBIN A1C: Hgb A1c MFr Bld: 5.5 % (ref 4.6–6.5)

## 2019-05-07 ENCOUNTER — Encounter: Payer: Self-pay | Admitting: Internal Medicine

## 2019-05-25 DIAGNOSIS — M47897 Other spondylosis, lumbosacral region: Secondary | ICD-10-CM | POA: Diagnosis not present

## 2019-05-25 DIAGNOSIS — M5030 Other cervical disc degeneration, unspecified cervical region: Secondary | ICD-10-CM | POA: Diagnosis not present

## 2019-05-25 DIAGNOSIS — G894 Chronic pain syndrome: Secondary | ICD-10-CM | POA: Diagnosis not present

## 2019-05-25 DIAGNOSIS — M9951 Intervertebral disc stenosis of neural canal of cervical region: Secondary | ICD-10-CM | POA: Diagnosis not present

## 2019-05-25 DIAGNOSIS — M25519 Pain in unspecified shoulder: Secondary | ICD-10-CM | POA: Diagnosis not present

## 2019-05-25 DIAGNOSIS — M48062 Spinal stenosis, lumbar region with neurogenic claudication: Secondary | ICD-10-CM | POA: Diagnosis not present

## 2019-05-25 DIAGNOSIS — M5136 Other intervertebral disc degeneration, lumbar region: Secondary | ICD-10-CM | POA: Diagnosis not present

## 2019-05-25 DIAGNOSIS — M25559 Pain in unspecified hip: Secondary | ICD-10-CM | POA: Diagnosis not present

## 2019-05-25 DIAGNOSIS — M542 Cervicalgia: Secondary | ICD-10-CM | POA: Diagnosis not present

## 2019-05-25 DIAGNOSIS — F419 Anxiety disorder, unspecified: Secondary | ICD-10-CM | POA: Diagnosis not present

## 2019-05-25 DIAGNOSIS — G4700 Insomnia, unspecified: Secondary | ICD-10-CM | POA: Diagnosis not present

## 2019-05-25 DIAGNOSIS — M792 Neuralgia and neuritis, unspecified: Secondary | ICD-10-CM | POA: Diagnosis not present

## 2019-05-25 DIAGNOSIS — G8929 Other chronic pain: Secondary | ICD-10-CM | POA: Diagnosis not present

## 2019-05-25 DIAGNOSIS — M79609 Pain in unspecified limb: Secondary | ICD-10-CM | POA: Diagnosis not present

## 2019-05-26 ENCOUNTER — Ambulatory Visit (INDEPENDENT_AMBULATORY_CARE_PROVIDER_SITE_OTHER): Payer: Medicare Other

## 2019-05-26 ENCOUNTER — Other Ambulatory Visit: Payer: Self-pay

## 2019-05-26 DIAGNOSIS — E538 Deficiency of other specified B group vitamins: Secondary | ICD-10-CM | POA: Diagnosis not present

## 2019-05-26 MED ORDER — CYANOCOBALAMIN 1000 MCG/ML IJ SOLN
1000.0000 ug | Freq: Once | INTRAMUSCULAR | Status: AC
  Administered 2019-05-26: 1000 ug via INTRAMUSCULAR

## 2019-05-26 NOTE — Progress Notes (Addendum)
Patient presented for B 12 injection to left deltoid, patient voiced no concerns nor showed any signs of distress during injection.  Reviewed.  Dr Scott 

## 2019-06-22 DIAGNOSIS — G894 Chronic pain syndrome: Secondary | ICD-10-CM | POA: Diagnosis not present

## 2019-06-22 DIAGNOSIS — M545 Low back pain: Secondary | ICD-10-CM | POA: Diagnosis not present

## 2019-06-22 DIAGNOSIS — M259 Joint disorder, unspecified: Secondary | ICD-10-CM | POA: Diagnosis not present

## 2019-06-30 ENCOUNTER — Other Ambulatory Visit: Payer: Self-pay

## 2019-06-30 ENCOUNTER — Ambulatory Visit (INDEPENDENT_AMBULATORY_CARE_PROVIDER_SITE_OTHER): Payer: Medicare Other | Admitting: Internal Medicine

## 2019-06-30 ENCOUNTER — Encounter: Payer: Self-pay | Admitting: Internal Medicine

## 2019-06-30 VITALS — BP 100/70 | HR 86 | Temp 98.2°F | Ht 67.01 in | Wt 161.8 lb

## 2019-06-30 DIAGNOSIS — E538 Deficiency of other specified B group vitamins: Secondary | ICD-10-CM

## 2019-06-30 DIAGNOSIS — L739 Follicular disorder, unspecified: Secondary | ICD-10-CM | POA: Insufficient documentation

## 2019-06-30 DIAGNOSIS — R739 Hyperglycemia, unspecified: Secondary | ICD-10-CM | POA: Diagnosis not present

## 2019-06-30 DIAGNOSIS — Z8579 Personal history of other malignant neoplasms of lymphoid, hematopoietic and related tissues: Secondary | ICD-10-CM | POA: Diagnosis not present

## 2019-06-30 DIAGNOSIS — D649 Anemia, unspecified: Secondary | ICD-10-CM

## 2019-06-30 DIAGNOSIS — K219 Gastro-esophageal reflux disease without esophagitis: Secondary | ICD-10-CM | POA: Diagnosis not present

## 2019-06-30 DIAGNOSIS — E7800 Pure hypercholesterolemia, unspecified: Secondary | ICD-10-CM

## 2019-06-30 DIAGNOSIS — M4696 Unspecified inflammatory spondylopathy, lumbar region: Secondary | ICD-10-CM

## 2019-06-30 DIAGNOSIS — Z8572 Personal history of non-Hodgkin lymphomas: Secondary | ICD-10-CM

## 2019-06-30 DIAGNOSIS — R5383 Other fatigue: Secondary | ICD-10-CM

## 2019-06-30 MED ORDER — ATORVASTATIN CALCIUM 10 MG PO TABS: 10.0000 mg | ORAL_TABLET | Freq: Every day | ORAL | 1 refills | Status: DC

## 2019-06-30 MED ORDER — MUPIROCIN 2 % EX OINT: TOPICAL_OINTMENT | CUTANEOUS | 0 refills | Status: DC

## 2019-06-30 MED ORDER — CYANOCOBALAMIN 1000 MCG/ML IJ SOLN
1000.0000 ug | Freq: Once | INTRAMUSCULAR | Status: AC
  Administered 2019-06-30: 1000 ug via INTRAMUSCULAR

## 2019-06-30 NOTE — Progress Notes (Signed)
Patient ID: Maria Keller, female   DOB: 19-Jun-1950, 69 y.o.   MRN: [id number]   Subjective:    Patient ID: Maria Keller, female    DOB: [birth year], 69 y.o.   MRN: [id number]  HPI This visit occurred during the SARS-CoV-2 public health emergency.  Safety protocols were in place, including screening questions prior to the visit, additional usage of staff PPE, and extensive cleaning of exam room while observing appropriate contact time as indicated for disinfecting solutions.  Patient here for a scheduled follow up.  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  Saw Dr Leretha Pol 04/08/19.  Clinically in remission. Stable.  Recommended f/u in one year.  Saw rheumatology 01/12/19 - f/u OA.  Stable.  Recommended f/u in one year.  Tries to stay active. Does have persistent low back pain.  Followed by Dr Primus Bravo.  Stable.   No chest pain or sob.  No acid reflux reported.  Takes miralax and senokot - alternating.  This controls her bowels.  Discussed last labs.  She is tolerating lipitor currently.  States started having some aching previously.  Stopped taking lipitor briefly and then started back qod dosing.  Now back on lipitor daily and doing well.  Cholesterol improved.  Has lost weight.  Watching her diet.    Past Medical History:  Diagnosis Date  . Anemia    iron deficiency and B12 deficiency  . Arthritis   . Bronchitis 08/2015  . Chronic back pain    followed by Dr Primus Bravo  . Depression   . Diverticulosis   . Esophageal stricture    requiring dilatation x 2  . Fibromyalgia   . Fibromyalgia   . Fibromyalgia   . Follicular lymphoma ([place])    Followed by Dr Leretha Pol, s/p chemo and XRT  . Follicular lymphoma ([place])   . GERD (gastroesophageal reflux disease)   . GERD (gastroesophageal reflux disease)   . Hemorrhoid   . Hiatal hernia   . History of kidney stones   . Hypercholesterolemia   . IBS (irritable bowel syndrome)   . IBS (irritable bowel syndrome)   .  Nephrolithiasis    followed by Dr Bernardo Heater, s/p stents  . PONV (postoperative nausea and vomiting)   . Shingles outbreak 11/26/2011  . Sleep apnea    Past Surgical History:  Procedure Laterality Date  . APPENDECTOMY    . BLADDER SURGERY  1993  . BREAST BIOPSY N/A   . capsule endoscopy    . CHOLECYSTECTOMY  1995  . COLONOSCOPY WITH PROPOFOL N/A 06/19/2017   Procedure: COLONOSCOPY WITH PROPOFOL;  Surgeon: Manya Silvas, MD;  Location: [facility] ENDOSCOPY;  Service: Endoscopy;  Laterality: N/A;  . ESOPHAGOGASTRODUODENOSCOPY (EGD) WITH PROPOFOL N/A 06/19/2017   Procedure: ESOPHAGOGASTRODUODENOSCOPY (EGD) WITH PROPOFOL;  Surgeon: Manya Silvas, MD;  Location: [facility] ENDOSCOPY;  Service: Endoscopy;  Laterality: N/A;  . FRACTURE SURGERY    . history of helicobacter pylori infection    . JOINT REPLACEMENT    . LEFT OOPHORECTOMY    . LEG SURGERY Left   . ORIF ANKLE FRACTURE  2012  . OVARIAN CYST REMOVAL     right  . RECTOCELE REPAIR N/A   . REPLACEMENT TOTAL KNEE Right 03/19/2016  . TONSILECTOMY/ADENOIDECTOMY WITH MYRINGOTOMY  1970  . TONSILLECTOMY N/A   . TUBAL LIGATION  1976  . VAGINAL HYSTERECTOMY  1980's   secondary to bleeding   Family History  Problem Relation Age of Onset  . Ulcers  Mother   . Ulcers Father   . Breast cancer Other        great aunt and grandfather  . Lung cancer Other        uncle  . Hypertension Maternal Grandmother   . Heart disease Maternal Grandmother        MI 109s  . Diabetes Maternal Grandmother   . Arthritis Maternal Grandmother   . Diabetes Maternal Grandfather    Social History   Socioeconomic History  . Marital status: Married    Spouse name: Not on file  . Number of children: Not on file  . Years of education: Not on file  . Highest education level: Not on file  Occupational History  . Not on file  Tobacco Use  . Smoking status: Never Smoker  . Smokeless tobacco: Never Used  Substance and Sexual Activity  . Alcohol use: No     Alcohol/week: 0.0 standard drinks  . Drug use: No  . Sexual activity: Yes  Other Topics Concern  . Not on file  Social History Narrative  . Not on file   Social Determinants of Health   Financial Resource Strain: Low Risk   . Difficulty of Paying Living Expenses: Not hard at all  Food Insecurity: No Food Insecurity  . Worried About Charity fundraiser in the Last Year: Never true  . Ran Out of Food in the Last Year: Never true  Transportation Needs: No Transportation Needs  . Lack of Transportation (Medical): No  . Lack of Transportation (Non-Medical): No  Physical Activity: Insufficiently Active  . Days of Exercise per Week: 1 day  . Minutes of Exercise per Session: 30 min  Stress: No Stress Concern Present  . Feeling of Stress : Not at all  Social Connections:   . Frequency of Communication with Friends and Family:   . Frequency of Social Gatherings with Friends and Family:   . Attends Religious Services:   . Active Member of Clubs or Organizations:   . Attends Archivist Meetings:   Marland Kitchen Marital Status:     Outpatient Encounter Medications as of 06/30/2019  Medication Sig  . aspirin EC 81 MG tablet Take by mouth.  Marland Kitchen atorvastatin (LIPITOR) 10 MG tablet Take 1 tablet (10 mg total) by mouth daily.  Marland Kitchen azelastine (ASTELIN) 0.1 % nasal spray ONER SPRAY IN EACH NOSTRIL TWICE DAILY AS DIRECTED  . calcium carbonate (TUMS - DOSED IN MG ELEMENTAL CALCIUM) 500 MG chewable tablet Chew 1 tablet by mouth as needed.  . cetirizine (ZYRTEC) 10 MG tablet Take by mouth.  . Diphenhyd-Hydrocort-Nystatin (FIRST-DUKES MOUTHWASH) SUSP Swish and spit 5 mLs tid prn  . estradiol (ESTRACE) 0.5 MG tablet TAKE ONE TABLET BY MOUTH DAILY  . fentaNYL (DURAGESIC) 100 MCG/HR Apply 1 patch to skin every 2 days with fentanyl patch 25 g per hour if tolerated  . fentaNYL (DURAGESIC) 25 MCG/HR patch Apply 1 patch to skin every 2 days with fentanyl patch 100 g per hour if tolerated  . fluticasone  (FLONASE) 50 MCG/ACT nasal spray Place 2 sprays into both nostrils daily.  . furosemide (LASIX) 20 MG tablet TAKE ONE TABLET BY MOUTH EVERY DAY AS NEEDED  . mupirocin ointment (BACTROBAN) 2 % Apply to affected area bid.  Marland Kitchen omeprazole (PRILOSEC) 20 MG capsule TAKE 1 CAPSULE BY MOUTH TWICE DAILY  . oxyCODONE (ROXICODONE) 5 MG immediate release tablet Limit 1 tab by mouth per day or twice per day if tolerated for breakthrough pain  while wearing fentanyl patches  . senna-docusate (SENOKOT-S) 8.6-50 MG per tablet Take by mouth.  . [DISCONTINUED] atorvastatin (LIPITOR) 10 MG tablet Take 1 tablet (10 mg total) by mouth daily.  . [DISCONTINUED] mupirocin ointment (BACTROBAN) 2 % Apply to affected area bid (Patient not taking: Reported on 06/30/2019)  . [EXPIRED] cyanocobalamin ((VITAMIN B-12)) injection 1,000 mcg    No facility-administered encounter medications on file as of 06/30/2019.    Review of Systems  Constitutional: Negative for appetite change and unexpected weight change.  HENT: Negative for congestion and sinus pressure.   Respiratory: Negative for cough, chest tightness and shortness of breath.   Cardiovascular: Negative for chest pain, palpitations and leg swelling.  Gastrointestinal: Negative for abdominal pain, diarrhea, nausea and vomiting.  Genitourinary: Negative for difficulty urinating and dysuria.  Musculoskeletal: Positive for back pain. Negative for myalgias.  Skin: Negative for color change.       Small erythematous lesions - c/w folliculitis.    Neurological: Negative for dizziness, light-headedness and headaches.  Psychiatric/Behavioral: Negative for agitation and dysphoric mood.       Objective:    Physical Exam Vitals reviewed.  Constitutional:      General: She is not in acute distress.    Appearance: Normal appearance.  HENT:     Head: Normocephalic and atraumatic.     Right Ear: External ear normal.     Left Ear: External ear normal.  Eyes:     General:  No scleral icterus.       Right eye: No discharge.        Left eye: No discharge.     Conjunctiva/sclera: Conjunctivae normal.  Neck:     Thyroid: No thyromegaly.  Cardiovascular:     Rate and Rhythm: Normal rate and regular rhythm.  Pulmonary:     Effort: No respiratory distress.     Breath sounds: Normal breath sounds. No wheezing.  Abdominal:     General: Bowel sounds are normal.     Palpations: Abdomen is soft.     Tenderness: There is no abdominal tenderness.  Musculoskeletal:        General: No swelling or tenderness.     Cervical back: Neck supple. No tenderness.  Lymphadenopathy:     Cervical: No cervical adenopathy.  Skin:    Findings: No erythema.     Comments: Small erythematous lesions - upper shoulder/upper back. Appears to be c/w folliculitis.    Neurological:     Mental Status: She is alert.  Psychiatric:        Mood and Affect: Mood normal.        Behavior: Behavior normal.     BP 100/70 (BP Location: Left Arm, Patient Position: Sitting)   Pulse 86   Temp 98.2 F (36.8 C)   Ht 5' 7.01" (1.702 m)   Wt 161 lb 12.8 oz (73.4 kg)   SpO2 (!) 65%   BMI 25.34 kg/m  Wt Readings from Last 3 Encounters:  06/30/19 161 lb 12.8 oz (73.4 kg)  03/04/19 164 lb (74.4 kg)  10/28/18 165 lb 12.8 oz (75.2 kg)     Lab Results  Component Value Date   WBC 6.8 12/25/2018   HGB 12.3 12/25/2018   HCT 38.0 12/25/2018   PLT 211.0 12/25/2018   GLUCOSE 98 05/06/2019   CHOL 160 05/06/2019   TRIG 73.0 05/06/2019   HDL 52.60 05/06/2019   LDLDIRECT 164.2 02/11/2013   LDLCALC 92 05/06/2019   ALT 9 05/06/2019   AST 15  05/06/2019   NA 141 05/06/2019   K 3.8 05/06/2019   CL 104 05/06/2019   CREATININE 0.65 05/06/2019   BUN 12 05/06/2019   CO2 32 05/06/2019   TSH 1.36 12/25/2018   HGBA1C 5.5 05/06/2019    MR Brain W Wo Contrast  Result Date: 05/14/2017 CLINICAL DATA:  Headaches for 2 months.  Non intractable headache. EXAM: MRI HEAD WITHOUT AND WITH CONTRAST TECHNIQUE:  Multiplanar, multiecho pulse sequences of the brain and surrounding structures were obtained without and with intravenous contrast. CONTRAST:  64m MULTIHANCE GADOBENATE DIMEGLUMINE 529 MG/ML IV SOLN COMPARISON:  None. FINDINGS: Brain: Periventricular and subcortical T2 hyperintensities bilaterally are mildly advanced for age. No acute infarct, hemorrhage, or mass lesion is present. Ventricles are of normal size. Insert pass fluid The internal auditory canals are within normal limits bilaterally. The brainstem and cerebellum are normal. Vascular: Flow is present in the major intracranial arteries. Skull and upper cervical spine: The skull base is within normal limits. Craniocervical junction is normal. The upper cervical spine is within normal limits. Sinuses/Orbits: Paranasal sinuses and mastoid air cells are clear. Bilateral lens replacements are present. Globes and orbits are within limits. Other: None. IMPRESSION: 1. Periventricular and subcortical T2 hyperintensities bilaterally are mildly advanced for age. The finding is nonspecific but can be seen in the setting of chronic microvascular ischemia, a demyelinating process such as multiple sclerosis, vasculitis, complicated migraine headaches, or as the sequelae of a prior infectious or inflammatory process. 2. No acute or focal abnormality to explain headaches otherwise. Electronically Signed   By: CSan MorelleM.D.   On: 05/14/2017 10:13       Assessment & Plan:   Problem List Items Addressed This Visit    Anemia    Has had colonoscopy and EGD.  History of iron deficiency and B12 def.  Continue B12 injections.  Follow cbc.       Fatigue    Reports is stable.  Follow.        Folliculitis    Saw dermatology.  Has a cleanse.  bactroban to use as needed.        GERD (gastroesophageal reflux disease)    Stable on omeprazole.        History of lymphoma    Followed by Dr DLeretha Pol   Evaluated 04/2019.  Stable.  Recommended f/u in one  year.        Hypercholesterolemia    Doing well on lipitor currently.  Recent cholesterol panel improved.  Low cholesterol diet and exercise.  Follow lipid panel and liver function tests.        Relevant Medications   atorvastatin (LIPITOR) 10 MG tablet   Hyperglycemia    Watching her diet.  Low carb diet and exercise.  Follow met b and a1c.  Recent 5.6.        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[facility]    Chronic back pain.  Followed by pain clinic.  Stable.        Other Visit Diagnoses    B12 deficiency    -  Primary   Relevant Medications   cyanocobalamin ((VITAMIN B-12)) injection 1,000 mcg (Completed)       CEinar Pheasant MD

## 2019-06-30 NOTE — Assessment & Plan Note (Signed)
Doing well on lipitor currently.  Recent cholesterol panel improved.  Low cholesterol diet and exercise.  Follow lipid panel and liver function tests.

## 2019-06-30 NOTE — Assessment & Plan Note (Signed)
Chronic back pain.  Followed by pain clinic.  Stable.

## 2019-06-30 NOTE — Assessment & Plan Note (Signed)
Reports is stable.  Follow.

## 2019-06-30 NOTE — Assessment & Plan Note (Signed)
Followed by Dr Leretha Pol.   Evaluated 04/2019.  Stable.  Recommended f/u in one year.

## 2019-06-30 NOTE — Assessment & Plan Note (Signed)
Has had colonoscopy and EGD.  History of iron deficiency and B12 def.  Continue B12 injections.  Follow cbc.

## 2019-06-30 NOTE — Assessment & Plan Note (Signed)
Saw dermatology.  Has a cleanse.  bactroban to use as needed.

## 2019-06-30 NOTE — Assessment & Plan Note (Signed)
Stable on omeprazole. 

## 2019-06-30 NOTE — Assessment & Plan Note (Signed)
Watching her diet.  Low carb diet and exercise.  Follow met b and a1c.  Recent 5.6.

## 2019-08-04 ENCOUNTER — Other Ambulatory Visit: Payer: Self-pay | Admitting: Internal Medicine

## 2019-08-20 ENCOUNTER — Ambulatory Visit (INDEPENDENT_AMBULATORY_CARE_PROVIDER_SITE_OTHER): Payer: Medicare Other

## 2019-08-20 VITALS — Ht 67.0 in | Wt 161.0 lb

## 2019-08-20 DIAGNOSIS — Z0000 Encounter for general adult medical examination without abnormal findings: Secondary | ICD-10-CM | POA: Diagnosis not present

## 2019-08-20 NOTE — Progress Notes (Addendum)
Subjective:   Maria Keller is a 69 y.o. female who presents for Medicare Annual (Subsequent) preventive examination.  Review of Systems    No ROS.  Medicare Wellness Virtual Visit.       Objective:    Today's Vitals   08/20/19 0935  Weight: 161 lb (73 kg)  Height: 5\' 7"  (1.702 m)   Body mass index is 25.22 kg/m.  Advanced Directives 08/20/2019 08/19/2018 06/19/2017 04/29/2017 04/26/2016 10/12/2015 09/13/2015  Does Patient Have a Medical Advance Directive? Yes Yes Yes Yes Yes Yes Yes  Type of Paramedic of [place];Living will [place];Living will - [place];Living will [place];Living will Living will Living will;Healthcare Power of Attorney  Does patient want to make changes to medical advance directive? No - Patient declined No - Patient declined - No - Patient declined No - Patient declined - No - Patient declined  Copy of [place] in Chart? No - copy requested No - copy requested - No - copy requested No - copy requested - No - copy requested    Current Medications (verified) Outpatient Encounter Medications as of 08/20/2019  Medication Sig  . aspirin EC 81 MG tablet Take by mouth.  Marland Kitchen atorvastatin (LIPITOR) 10 MG tablet Take 1 tablet (10 mg total) by mouth daily.  Marland Kitchen azelastine (ASTELIN) 0.1 % nasal spray ONER SPRAY IN EACH NOSTRIL TWICE DAILY AS DIRECTED  . calcium carbonate (TUMS - DOSED IN MG ELEMENTAL CALCIUM) 500 MG chewable tablet Chew 1 tablet by mouth as needed.  . cetirizine (ZYRTEC) 10 MG tablet Take by mouth.  . Diphenhyd-Hydrocort-Nystatin (FIRST-DUKES MOUTHWASH) SUSP Swish and spit 5 mLs tid prn  . estradiol (ESTRACE) 0.5 MG tablet TAKE ONE TABLET BY MOUTH DAILY  . fentaNYL (DURAGESIC) 100 MCG/HR Apply 1 patch to skin every 2 days with fentanyl patch 25 g per hour if tolerated  . fentaNYL (DURAGESIC) 25 MCG/HR patch Apply 1 patch to skin every 2 days with fentanyl  patch 100 g per hour if tolerated  . fluticasone (FLONASE) 50 MCG/ACT nasal spray Place 2 sprays into both nostrils daily.  . furosemide (LASIX) 20 MG tablet TAKE ONE TABLET BY MOUTH EVERY DAY AS NEEDED  . mupirocin ointment (BACTROBAN) 2 % Apply to affected area bid.  Marland Kitchen omeprazole (PRILOSEC) 20 MG capsule TAKE 1 CAPSULE BY MOUTH TWICE DAILY  . oxyCODONE (ROXICODONE) 5 MG immediate release tablet Limit 1 tab by mouth per day or twice per day if tolerated for breakthrough pain while wearing fentanyl patches  . senna-docusate (SENOKOT-S) 8.6-50 MG per tablet Take by mouth.   No facility-administered encounter medications on file as of 08/20/2019.    Allergies (verified) Oxycodone hcl, Oxycontin [oxycodone hcl], Penicillins, Chloral hydrate, Ciprofloxacin, Ciprofloxacin hcl, Colace [docusate calcium], Methotrexate derivatives, Parafon forte dsc [chlorzoxazone], Penicillin g, Tape, Bextra [valdecoxib], Oxycodone, and Rofecoxib   History: Past Medical History:  Diagnosis Date  . Anemia    iron deficiency and B12 deficiency  . Arthritis   . Bronchitis 08/2015  . Chronic back pain    followed by Dr Primus Bravo  . Depression   . Diverticulosis   . Esophageal stricture    requiring dilatation x 2  . Fibromyalgia   . Fibromyalgia   . Fibromyalgia   . Follicular lymphoma ([place])    Followed by Dr Leretha Pol, s/p chemo and XRT  . Follicular lymphoma ([place])   . GERD (gastroesophageal reflux disease)   . GERD (  gastroesophageal reflux disease)   . Hemorrhoid   . Hiatal hernia   . History of kidney stones   . Hypercholesterolemia   . IBS (irritable bowel syndrome)   . IBS (irritable bowel syndrome)   . Nephrolithiasis    followed by Dr Bernardo Heater, s/p stents  . PONV (postoperative nausea and vomiting)   . Shingles outbreak 11/26/2011  . Sleep apnea    Past Surgical History:  Procedure Laterality Date  . APPENDECTOMY    . BLADDER SURGERY  1993  . BREAST BIOPSY N/A   . capsule endoscopy    .  CHOLECYSTECTOMY  1995  . COLONOSCOPY WITH PROPOFOL N/A 06/19/2017   Procedure: COLONOSCOPY WITH PROPOFOL;  Surgeon: Manya Silvas, MD;  Location: [facility] ENDOSCOPY;  Service: Endoscopy;  Laterality: N/A;  . ESOPHAGOGASTRODUODENOSCOPY (EGD) WITH PROPOFOL N/A 06/19/2017   Procedure: ESOPHAGOGASTRODUODENOSCOPY (EGD) WITH PROPOFOL;  Surgeon: Manya Silvas, MD;  Location: [facility] ENDOSCOPY;  Service: Endoscopy;  Laterality: N/A;  . FRACTURE SURGERY    . history of helicobacter pylori infection    . JOINT REPLACEMENT    . LEFT OOPHORECTOMY    . LEG SURGERY Left   . ORIF ANKLE FRACTURE  2012  . OVARIAN CYST REMOVAL     right  . RECTOCELE REPAIR N/A   . REPLACEMENT TOTAL KNEE Right 03/19/2016  . TONSILECTOMY/ADENOIDECTOMY WITH MYRINGOTOMY  1970  . TONSILLECTOMY N/A   . TUBAL LIGATION  1976  . VAGINAL HYSTERECTOMY  1980's   secondary to bleeding   Family History  Problem Relation Age of Onset  . Ulcers Mother   . Ulcers Father   . Breast cancer Other        great aunt and grandfather  . Lung cancer Other        uncle  . Hypertension Maternal Grandmother   . Heart disease Maternal Grandmother        MI 68s  . Diabetes Maternal Grandmother   . Arthritis Maternal Grandmother   . Diabetes Maternal Grandfather    Social History   Socioeconomic History  . Marital status: Married    Spouse name: Not on file  . Number of children: Not on file  . Years of education: Not on file  . Highest education level: Not on file  Occupational History  . Not on file  Tobacco Use  . Smoking status: Never Smoker  . Smokeless tobacco: Never Used  Vaping Use  . Vaping Use: Never used  Substance and Sexual Activity  . Alcohol use: No    Alcohol/week: 0.0 standard drinks  . Drug use: No  . Sexual activity: Yes  Other Topics Concern  . Not on file  Social History Narrative  . Not on file   Social Determinants of Health   Financial Resource Strain:   . Difficulty of Paying Living Expenses:     Food Insecurity:   . Worried About Charity fundraiser in the Last Year:   . Arboriculturist in the Last Year:   Transportation Needs:   . Film/video editor (Medical):   Marland Kitchen Lack of Transportation (Non-Medical):   Physical Activity:   . Days of Exercise per Week:   . Minutes of Exercise per Session:   Stress:   . Feeling of Stress :   Social Connections:   . Frequency of Communication with Friends and Family:   . Frequency of Social Gatherings with Friends and Family:   . Attends Religious Services:   . Active  Member of Clubs or Organizations:   . Attends Archivist Meetings:   Marland Kitchen Marital Status:     Tobacco Counseling Counseling given: Not Answered   Clinical Intake:  Pre-visit preparation completed: Yes        Diabetes: No  How often do you need to have someone help you when you read instructions, pamphlets, or other written materials from your doctor or pharmacy?: 1 - Never   Interpreter Needed?: No      Activities of Daily Living In your present state of health, do you have any difficulty performing the following activities: 08/20/2019  Hearing? N  Vision? N  Difficulty concentrating or making decisions? N  Walking or climbing stairs? N  Dressing or bathing? N  Doing errands, shopping? N  Preparing Food and eating ? N  Using the Toilet? N  In the past six months, have you accidently leaked urine? N  Do you have problems with loss of bowel control? N  Managing your Medications? N  Managing your Finances? N  Housekeeping or managing your Housekeeping? N  Some recent data might be hidden    Patient Care Team: Einar Pheasant, MD as PCP - General (Internal Medicine)  Indicate any recent Medical Services you may have received from other than [facility] providers in the past year (date may be approximate).     Assessment:   This is a routine wellness examination for Maria Keller.  I connected with Maria Keller today by telephone and verified that I am  speaking with the correct person using two identifiers. Location patient: home Location provider: work Persons participating in the virtual visit: patient, Marine scientist.    I discussed the limitations, risks, security and privacy concerns of performing an evaluation and management service by telephone and the availability of in person appointments. The patient expressed understanding and verbally consented to this telephonic visit.    Interactive audio and video telecommunications were attempted between this provider and patient, however failed, due to patient having technical difficulties OR patient did not have access to video capability.  We continued and completed visit with audio only.  Some vital signs may be absent or patient reported.   Hearing/Vision screen  Hearing Screening   125Hz  250Hz  500Hz  1000Hz  2000Hz  3000Hz  4000Hz  6000Hz  8000Hz   Right ear:           Left ear:           Comments: Patient is able to hear conversational tones without difficulty.  No issues reported.  Vision Screening Comments: Followed by [facility] Cataract extraction, bilateral Visual acuity not assessed, virtual visit.  They have seen their ophthalmologist in the last 12 months.     Dietary issues and exercise activities discussed: Current Exercise Habits: Home exercise routine, Type of exercise: walking;treadmill, Time (Minutes): 30, Frequency (Times/Week): 5, Weekly Exercise (Minutes/Week): 150, Intensity: Mild  Healthy diet Fair water intake Caffeine- minimal  Goals      Patient Stated   .  Increase physical activity (pt-stated)      Swim for exercise        Depression Screen PHQ 2/9 Scores 08/20/2019 06/30/2019 08/19/2018 04/29/2017 04/26/2016 01/05/2016 10/12/2015  PHQ - 2 Score 0 0 0 0 0 0 0  Exception Documentation - - - - - - -    Fall Risk Fall Risk  06/30/2019 03/04/2019 12/30/2018 08/19/2018 04/29/2017  Falls in the past year? 0 0 0 0 No  Comment - - Emmi Telephone Survey: data to  providers prior to  load - -  Number falls in past yr: 0 - - - -  Injury with Fall? 0 - - - -  Risk for fall due to : - - - - -  Risk for fall due to: Comment - - - - -  Follow up Falls evaluation completed Falls evaluation completed - - -   Handrails in use when climbing stairs? Yes  Home free of loose throw rugs in walkways, pet beds, electrical cords, etc? Yes  Adequate lighting in your home to reduce risk of falls? Yes   ASSISTIVE DEVICES UTILIZED TO PREVENT FALLS: Life alert? No  Use of a cane, walker or w/c? No  Grab bars in the bathroom? Yes  Shower chair or bench in shower? No  Elevated toilet seat or a handicapped toilet? Yes   TIMED UP AND GO:  Was the test performed? No .   Cognitive Function: MMSE - Mini Mental State Exam 04/29/2017 04/26/2016 04/27/2015  Orientation to time 5 5 5   Orientation to Place 5 5 5   Registration 3 3 3   Attention/ Calculation 5 5 5   Recall 3 3 3   Language- name 2 objects 2 2 2   Language- repeat 1 1 1   Language- follow 3 step command 3 3 3   Language- read & follow direction 1 1 1   Write a sentence 1 1 1   Copy design 1 1 1   Total score 30 30 30      6CIT Screen 08/20/2019 08/19/2018  What Year? - 0 points  What month? - 0 points  What time? - 0 points  Count back from 20 - 0 points  Months in reverse 0 points 0 points  Repeat phrase - 0 points  Total Score - 0    Immunizations Immunization History  Administered Date(s) Administered  . Fluad Quad(high Dose 65+) 10/28/2018  . Influenza Split 02/25/2012  . Influenza, High Dose Seasonal PF 11/17/2015, 01/07/2017, 02/01/2018  . Influenza,inj,Quad PF,6+ Mos 02/22/2014, 10/27/2014  . Influenza-Unspecified 02/29/2012, 10/28/2012, 02/22/2014, 10/27/2014, 11/17/2015  . Moderna SARS-COVID-2 Vaccination 03/20/2019, 04/20/2019  . Pneumococcal Conjugate-13 03/02/2015  . Pneumococcal Polysaccharide-23 01/07/2017  . Tdap 10/16/2018  . Zoster 11/06/2005  . Zoster Recombinat (Shingrix)  10/16/2017, 12/24/2017   Health Maintenance There are no preventive care reminders to display for this patient.  Health Maintenance  Topic Date Due  . INFLUENZA VACCINE  09/06/2019  . MAMMOGRAM  11/10/2020  . COLONOSCOPY  06/20/2022  . TETANUS/TDAP  10/15/2028  . DEXA SCAN  Completed  . COVID-19 Vaccine  Completed  . Hepatitis C Screening  Completed  . PNA vac Low Risk Adult  Completed    Dental Screening: Recommended annual dental exams for proper oral hygiene  Community Resource Referral / Chronic Care Management: CRR required this visit?  No   CCM required this visit?  No      Plan:   Keep all routine maintenance appointments.   Next scheduled lab 11/10/19 @ 9:00  Cpe 11/12/19 @ 9:00  I have personally reviewed and noted the following in the patient's chart:   . Medical and social history . Use of alcohol, tobacco or illicit drugs  . Current medications and supplements . Functional ability and status . Nutritional status . Physical activity . Advanced directives . List of other physicians . Hospitalizations, surgeries, and ER visits in previous 12 months . Vitals . Screenings to include cognitive, depression, and falls . Referrals and appointments  In addition, I have reviewed and discussed with patient certain preventive  protocols, quality metrics, and best practice recommendations. A written personalized care plan for preventive services as well as general preventive health recommendations were provided to patient via mychart.     Varney Biles, LPN   [id number]    Reviewed above information.  Agree with assessment and plan.   Dr Nicki Reaper

## 2019-08-20 NOTE — Patient Instructions (Addendum)
Maria Keller , Thank you for taking time to come for your Medicare Wellness Visit. I appreciate your ongoing commitment to your health goals. Please review the following plan we discussed and let me know if I can assist you in the future.   These are the goals we discussed: Goals      Patient Stated   .  Increase physical activity (pt-stated)      Swim for exercise         This is a list of the screening recommended for you and due dates:  Health Maintenance  Topic Date Due  . Flu Shot  09/06/2019  . Mammogram  11/10/2020  . Colon Cancer Screening  06/20/2022  . Tetanus Vaccine  10/15/2028  . DEXA scan (bone density measurement)  Completed  . COVID-19 Vaccine  Completed  .  Hepatitis C: One time screening is recommended by Center for Disease Control  (CDC) for  adults born from [birth year] through 1967.   Completed  . Pneumonia vaccines  Completed    Immunizations Immunization History  Administered Date(s) Administered  . Fluad Quad(high Dose 65+) 10/28/2018  . Influenza Split 02/25/2012  . Influenza, High Dose Seasonal PF 11/17/2015, 01/07/2017, 02/01/2018  . Influenza,inj,Quad PF,6+ Mos 02/22/2014, 10/27/2014  . Influenza-Unspecified 02/29/2012, 10/28/2012, 02/22/2014, 10/27/2014, 11/17/2015  . Moderna SARS-COVID-2 Vaccination 03/20/2019, 04/20/2019  . Pneumococcal Conjugate-13 03/02/2015  . Pneumococcal Polysaccharide-23 01/07/2017  . Tdap 10/16/2018  . Zoster 11/06/2005  . Zoster Recombinat (Shingrix) 10/16/2017, 12/24/2017   Keep all routine maintenance appointments.   Next scheduled lab 11/10/19 @ 9:00  Cpe 11/12/19 @ 9:00  Advanced directives: End of life planning; Advance aging; Advanced directives discussed.  Copy of current HCPOA/Living Will requested.    Conditions/risks identified: none new  Follow up in one year for your annual wellness visit.   Preventive Care 71 Years and Older, Female Preventive care refers to lifestyle choices and visits with your health  care provider that can promote health and wellness. What does preventive care include?  A yearly physical exam. This is also called an annual well check.  Dental exams once or twice a year.  Routine eye exams. Ask your health care provider how often you should have your eyes checked.  Personal lifestyle choices, including:  Daily care of your teeth and gums.  Regular physical activity.  Eating a healthy diet.  Avoiding tobacco and drug use.  Limiting alcohol use.  Practicing safe sex.  Taking low-dose aspirin every day.  Taking vitamin and mineral supplements as recommended by your health care provider. What happens during an annual well check? The services and screenings done by your health care provider during your annual well check will depend on your age, overall health, lifestyle risk factors, and family history of disease. Counseling  Your health care provider may ask you questions about your:  Alcohol use.  Tobacco use.  Drug use.  Emotional well-being.  Home and relationship well-being.  Sexual activity.  Eating habits.  History of falls.  Memory and ability to understand (cognition).  Work and work Statistician.  Reproductive health. Screening  You may have the following tests or measurements:  Height, weight, and BMI.  Blood pressure.  Lipid and cholesterol levels. These may be checked every 5 years, or more frequently if you are over 28 years old.  Skin check.  Lung cancer screening. You may have this screening every year starting at age 71 if you have a 30-pack-year history of smoking and currently smoke or  have quit within the past 15 years.  Fecal occult blood test (FOBT) of the stool. You may have this test every year starting at age 71.  Flexible sigmoidoscopy or colonoscopy. You may have a sigmoidoscopy every 5 years or a colonoscopy every 10 years starting at age 71.  Hepatitis C blood test.  Hepatitis B blood test.  Sexually  transmitted disease (STD) testing.  Diabetes screening. This is done by checking your blood sugar (glucose) after you have not eaten for a while (fasting). You may have this done every 1-3 years.  Bone density scan. This is done to screen for osteoporosis. You may have this done starting at age 71.  Mammogram. This may be done every 1-2 years. Talk to your health care provider about how often you should have regular mammograms. Talk with your health care provider about your test results, treatment options, and if necessary, the need for more tests. Vaccines  Your health care provider may recommend certain vaccines, such as:  Influenza vaccine. This is recommended every year.  Tetanus, diphtheria, and acellular pertussis (Tdap, Td) vaccine. You may need a Td booster every 10 years.  Zoster vaccine. You may need this after age 71.  Pneumococcal 13-valent conjugate (PCV13) vaccine. One dose is recommended after age 71.  Pneumococcal polysaccharide (PPSV23) vaccine. One dose is recommended after age 71. Talk to your health care provider about which screenings and vaccines you need and how often you need them. This information is not intended to replace advice given to you by your health care provider. Make sure you discuss any questions you have with your health care provider. Document Released: 02/18/2015 Document Revised: 10/12/2015 Document Reviewed: 11/23/2014 Elsevier Interactive Patient Education  2017 [place] Prevention in the Home Falls can cause injuries. They can happen to people of all ages. There are many things you can do to make your home safe and to help prevent falls. What can I do on the outside of my home?  Regularly fix the edges of walkways and driveways and fix any cracks.  Remove anything that might make you trip as you walk through a door, such as a raised step or threshold.  Trim any bushes or trees on the path to your home.  Use bright outdoor  lighting.  Clear any walking paths of anything that might make someone trip, such as rocks or tools.  Regularly check to see if handrails are loose or broken. Make sure that both sides of any steps have handrails.  Any raised decks and porches should have guardrails on the edges.  Have any leaves, snow, or ice cleared regularly.  Use sand or salt on walking paths during winter.  Clean up any spills in your garage right away. This includes oil or grease spills. What can I do in the bathroom?  Use night lights.  Install grab bars by the toilet and in the tub and shower. Do not use towel bars as grab bars.  Use non-skid mats or decals in the tub or shower.  If you need to sit down in the shower, use a plastic, non-slip stool.  Keep the floor dry. Clean up any water that spills on the floor as soon as it happens.  Remove soap buildup in the tub or shower regularly.  Attach bath mats securely with double-sided non-slip rug tape.  Do not have throw rugs and other things on the floor that can make you trip. What can I do in the bedroom?  Use night lights.  Make sure that you have a light by your bed that is easy to reach.  Do not use any sheets or blankets that are too big for your bed. They should not hang down onto the floor.  Have a firm chair that has side arms. You can use this for support while you get dressed.  Do not have throw rugs and other things on the floor that can make you trip. What can I do in the kitchen?  Clean up any spills right away.  Avoid walking on wet floors.  Keep items that you use a lot in easy-to-reach places.  If you need to reach something above you, use a strong step stool that has a grab bar.  Keep electrical cords out of the way.  Do not use floor polish or wax that makes floors slippery. If you must use wax, use non-skid floor wax.  Do not have throw rugs and other things on the floor that can make you trip. What can I do with my  stairs?  Do not leave any items on the stairs.  Make sure that there are handrails on both sides of the stairs and use them. Fix handrails that are broken or loose. Make sure that handrails are as long as the stairways.  Check any carpeting to make sure that it is firmly attached to the stairs. Fix any carpet that is loose or worn.  Avoid having throw rugs at the top or bottom of the stairs. If you do have throw rugs, attach them to the floor with carpet tape.  Make sure that you have a light switch at the top of the stairs and the bottom of the stairs. If you do not have them, ask someone to add them for you. What else can I do to help prevent falls?  Wear shoes that:  Do not have high heels.  Have rubber bottoms.  Are comfortable and fit you well.  Are closed at the toe. Do not wear sandals.  If you use a stepladder:  Make sure that it is fully opened. Do not climb a closed stepladder.  Make sure that both sides of the stepladder are locked into place.  Ask someone to hold it for you, if possible.  Clearly mark and make sure that you can see:  Any grab bars or handrails.  First and last steps.  Where the edge of each step is.  Use tools that help you move around (mobility aids) if they are needed. These include:  Canes.  Walkers.  Scooters.  Crutches.  Turn on the lights when you go into a dark area. Replace any light bulbs as soon as they burn out.  Set up your furniture so you have a clear path. Avoid moving your furniture around.  If any of your floors are uneven, fix them.  If there are any pets around you, be aware of where they are.  Review your medicines with your doctor. Some medicines can make you feel dizzy. This can increase your chance of falling. Ask your doctor what other things that you can do to help prevent falls. This information is not intended to replace advice given to you by your health care provider. Make sure you discuss any  questions you have with your health care provider. Document Released: 11/18/2008 Document Revised: 06/30/2015 Document Reviewed: 02/26/2014 Elsevier Interactive Patient Education  2017 [employer].

## 2019-08-24 DIAGNOSIS — G894 Chronic pain syndrome: Secondary | ICD-10-CM | POA: Diagnosis not present

## 2019-08-24 DIAGNOSIS — M542 Cervicalgia: Secondary | ICD-10-CM | POA: Diagnosis not present

## 2019-08-24 DIAGNOSIS — M5136 Other intervertebral disc degeneration, lumbar region: Secondary | ICD-10-CM | POA: Diagnosis not present

## 2019-08-24 DIAGNOSIS — M5030 Other cervical disc degeneration, unspecified cervical region: Secondary | ICD-10-CM | POA: Diagnosis not present

## 2019-09-21 DIAGNOSIS — M792 Neuralgia and neuritis, unspecified: Secondary | ICD-10-CM | POA: Diagnosis not present

## 2019-09-21 DIAGNOSIS — M9951 Intervertebral disc stenosis of neural canal of cervical region: Secondary | ICD-10-CM | POA: Diagnosis not present

## 2019-09-21 DIAGNOSIS — M25519 Pain in unspecified shoulder: Secondary | ICD-10-CM | POA: Diagnosis not present

## 2019-09-21 DIAGNOSIS — G4700 Insomnia, unspecified: Secondary | ICD-10-CM | POA: Diagnosis not present

## 2019-09-21 DIAGNOSIS — M48062 Spinal stenosis, lumbar region with neurogenic claudication: Secondary | ICD-10-CM | POA: Diagnosis not present

## 2019-09-21 DIAGNOSIS — Z96651 Presence of right artificial knee joint: Secondary | ICD-10-CM | POA: Diagnosis not present

## 2019-09-21 DIAGNOSIS — B0229 Other postherpetic nervous system involvement: Secondary | ICD-10-CM | POA: Diagnosis not present

## 2019-09-21 DIAGNOSIS — C8590 Non-Hodgkin lymphoma, unspecified, unspecified site: Secondary | ICD-10-CM | POA: Diagnosis not present

## 2019-09-21 DIAGNOSIS — G8929 Other chronic pain: Secondary | ICD-10-CM | POA: Diagnosis not present

## 2019-09-21 DIAGNOSIS — M25559 Pain in unspecified hip: Secondary | ICD-10-CM | POA: Diagnosis not present

## 2019-09-21 DIAGNOSIS — M47897 Other spondylosis, lumbosacral region: Secondary | ICD-10-CM | POA: Diagnosis not present

## 2019-09-21 DIAGNOSIS — M79609 Pain in unspecified limb: Secondary | ICD-10-CM | POA: Diagnosis not present

## 2019-09-21 DIAGNOSIS — F419 Anxiety disorder, unspecified: Secondary | ICD-10-CM | POA: Diagnosis not present

## 2019-09-25 DIAGNOSIS — G894 Chronic pain syndrome: Secondary | ICD-10-CM | POA: Diagnosis not present

## 2019-10-15 ENCOUNTER — Other Ambulatory Visit: Payer: Self-pay | Admitting: Internal Medicine

## 2019-10-19 DIAGNOSIS — B0229 Other postherpetic nervous system involvement: Secondary | ICD-10-CM | POA: Diagnosis not present

## 2019-10-19 DIAGNOSIS — Z96651 Presence of right artificial knee joint: Secondary | ICD-10-CM | POA: Diagnosis not present

## 2019-10-28 DIAGNOSIS — M9903 Segmental and somatic dysfunction of lumbar region: Secondary | ICD-10-CM | POA: Diagnosis not present

## 2019-10-28 DIAGNOSIS — M4607 Spinal enthesopathy, lumbosacral region: Secondary | ICD-10-CM | POA: Diagnosis not present

## 2019-10-28 DIAGNOSIS — M9904 Segmental and somatic dysfunction of sacral region: Secondary | ICD-10-CM | POA: Diagnosis not present

## 2019-10-28 DIAGNOSIS — M9902 Segmental and somatic dysfunction of thoracic region: Secondary | ICD-10-CM | POA: Diagnosis not present

## 2019-10-30 ENCOUNTER — Other Ambulatory Visit: Payer: Self-pay | Admitting: Internal Medicine

## 2019-11-04 DIAGNOSIS — M4607 Spinal enthesopathy, lumbosacral region: Secondary | ICD-10-CM | POA: Diagnosis not present

## 2019-11-04 DIAGNOSIS — M9904 Segmental and somatic dysfunction of sacral region: Secondary | ICD-10-CM | POA: Diagnosis not present

## 2019-11-04 DIAGNOSIS — M9903 Segmental and somatic dysfunction of lumbar region: Secondary | ICD-10-CM | POA: Diagnosis not present

## 2019-11-04 DIAGNOSIS — M9902 Segmental and somatic dysfunction of thoracic region: Secondary | ICD-10-CM | POA: Diagnosis not present

## 2019-11-09 ENCOUNTER — Telehealth: Payer: Self-pay | Admitting: *Deleted

## 2019-11-09 DIAGNOSIS — E538 Deficiency of other specified B group vitamins: Secondary | ICD-10-CM | POA: Insufficient documentation

## 2019-11-09 DIAGNOSIS — R739 Hyperglycemia, unspecified: Secondary | ICD-10-CM

## 2019-11-09 DIAGNOSIS — E7800 Pure hypercholesterolemia, unspecified: Secondary | ICD-10-CM

## 2019-11-09 NOTE — Telephone Encounter (Signed)
Please place future orders for lab appt.  

## 2019-11-09 NOTE — Telephone Encounter (Signed)
Orders placed for f/u labs.  

## 2019-11-10 ENCOUNTER — Other Ambulatory Visit: Payer: Self-pay

## 2019-11-10 ENCOUNTER — Other Ambulatory Visit (INDEPENDENT_AMBULATORY_CARE_PROVIDER_SITE_OTHER): Payer: Medicare Other

## 2019-11-10 DIAGNOSIS — E7800 Pure hypercholesterolemia, unspecified: Secondary | ICD-10-CM | POA: Diagnosis not present

## 2019-11-10 DIAGNOSIS — R739 Hyperglycemia, unspecified: Secondary | ICD-10-CM | POA: Diagnosis not present

## 2019-11-10 DIAGNOSIS — E538 Deficiency of other specified B group vitamins: Secondary | ICD-10-CM

## 2019-11-10 LAB — HEPATIC FUNCTION PANEL
ALT: 14 U/L (ref 0–35)
AST: 19 U/L (ref 0–37)
Albumin: 4.4 g/dL (ref 3.5–5.2)
Alkaline Phosphatase: 65 U/L (ref 39–117)
Bilirubin, Direct: 0.1 mg/dL (ref 0.0–0.3)
Total Bilirubin: 0.6 mg/dL (ref 0.2–1.2)
Total Protein: 6.9 g/dL (ref 6.0–8.3)

## 2019-11-10 LAB — CBC WITH DIFFERENTIAL/PLATELET
Basophils Absolute: 0 10*3/uL (ref 0.0–0.1)
Basophils Relative: 0.6 % (ref 0.0–3.0)
Eosinophils Absolute: 0.1 10*3/uL (ref 0.0–0.7)
Eosinophils Relative: 2 % (ref 0.0–5.0)
HCT: 40.3 % (ref 36.0–46.0)
Hemoglobin: 13.5 g/dL (ref 12.0–15.0)
Lymphocytes Relative: 32.2 % (ref 12.0–46.0)
Lymphs Abs: 2.5 10*3/uL (ref 0.7–4.0)
MCHC: 33.4 g/dL (ref 30.0–36.0)
MCV: 83.6 fl (ref 78.0–100.0)
Monocytes Absolute: 0.5 10*3/uL (ref 0.1–1.0)
Monocytes Relative: 7 % (ref 3.0–12.0)
Neutro Abs: 4.5 10*3/uL (ref 1.4–7.7)
Neutrophils Relative %: 58.2 % (ref 43.0–77.0)
Platelets: 202 10*3/uL (ref 150.0–400.0)
RBC: 4.82 Mil/uL (ref 3.87–5.11)
RDW: 14.2 % (ref 11.5–15.5)
WBC: 7.6 10*3/uL (ref 4.0–10.5)

## 2019-11-10 LAB — VITAMIN B12: Vitamin B-12: 426 pg/mL (ref 211–911)

## 2019-11-10 LAB — BASIC METABOLIC PANEL
BUN: 11 mg/dL (ref 6–23)
CO2: 35 mEq/L — ABNORMAL HIGH (ref 19–32)
Calcium: 9.4 mg/dL (ref 8.4–10.5)
Chloride: 99 mEq/L (ref 96–112)
Creatinine, Ser: 0.89 mg/dL (ref 0.40–1.20)
GFR: 65.23 mL/min (ref >60.00)
Glucose, Bld: 103 mg/dL — ABNORMAL HIGH (ref 70–99)
Potassium: 4.4 mEq/L (ref 3.5–5.1)
Sodium: 142 mEq/L (ref 135–145)

## 2019-11-10 LAB — LIPID PANEL
Cholesterol: 177 mg/dL (ref 0–200)
HDL: 55.6 mg/dL (ref >39.00)
LDL Cholesterol: 99 mg/dL (ref 0–99)
NonHDL: 121.36
Total CHOL/HDL Ratio: 3
Triglycerides: 114 mg/dL (ref 0.0–149.0)
VLDL: 22.8 mg/dL (ref 0.0–40.0)

## 2019-11-10 LAB — HEMOGLOBIN A1C: Hgb A1c MFr Bld: 5.7 % (ref 4.6–6.5)

## 2019-11-12 ENCOUNTER — Other Ambulatory Visit: Payer: Self-pay

## 2019-11-12 ENCOUNTER — Ambulatory Visit (INDEPENDENT_AMBULATORY_CARE_PROVIDER_SITE_OTHER): Payer: Medicare Other

## 2019-11-12 ENCOUNTER — Ambulatory Visit (INDEPENDENT_AMBULATORY_CARE_PROVIDER_SITE_OTHER): Payer: Medicare Other | Admitting: Internal Medicine

## 2019-11-12 VITALS — BP 102/62 | HR 71 | Temp 98.3°F | Resp 16 | Ht 67.0 in | Wt 165.6 lb

## 2019-11-12 DIAGNOSIS — E7800 Pure hypercholesterolemia, unspecified: Secondary | ICD-10-CM | POA: Diagnosis not present

## 2019-11-12 DIAGNOSIS — M79605 Pain in left leg: Secondary | ICD-10-CM

## 2019-11-12 DIAGNOSIS — Z8579 Personal history of other malignant neoplasms of lymphoid, hematopoietic and related tissues: Secondary | ICD-10-CM

## 2019-11-12 DIAGNOSIS — D649 Anemia, unspecified: Secondary | ICD-10-CM

## 2019-11-12 DIAGNOSIS — M4696 Unspecified inflammatory spondylopathy, lumbar region: Secondary | ICD-10-CM | POA: Diagnosis not present

## 2019-11-12 DIAGNOSIS — Z23 Encounter for immunization: Secondary | ICD-10-CM

## 2019-11-12 DIAGNOSIS — M545 Low back pain, unspecified: Secondary | ICD-10-CM

## 2019-11-12 DIAGNOSIS — M79604 Pain in right leg: Secondary | ICD-10-CM

## 2019-11-12 DIAGNOSIS — R739 Hyperglycemia, unspecified: Secondary | ICD-10-CM

## 2019-11-12 DIAGNOSIS — G8929 Other chronic pain: Secondary | ICD-10-CM | POA: Diagnosis not present

## 2019-11-12 DIAGNOSIS — Z0000 Encounter for general adult medical examination without abnormal findings: Secondary | ICD-10-CM | POA: Diagnosis not present

## 2019-11-12 DIAGNOSIS — C8580 Other specified types of non-Hodgkin lymphoma, unspecified site: Secondary | ICD-10-CM | POA: Diagnosis not present

## 2019-11-12 NOTE — Assessment & Plan Note (Addendum)
Physical today 11/12/19.  S/p hysterectomy.  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  Has performed at [facility]  06/2017 - colonoscopy (Dr Tiffany Kocher) - internal hemorrhoids/melanosis - recommended f/u in 5 years.

## 2019-11-12 NOTE — Progress Notes (Signed)
Subjective:    Patient ID: Maria Keller, female    DOB: 31-Dec-1950, 69 y.o.   MRN: [id number]  HPI This visit occurred during the SARS-CoV-2 public health emergency.  Safety protocols were in place, including screening questions prior to the visit, additional usage of staff PPE, and extensive cleaning of exam room while observing appropriate contact time as indicated for disinfecting solutions.  Patient with past history of follicular lymphoma followed by Dr Leretha Pol.  Last evaluated 04/2019 - stable.  Recommended f/u in one year.  She has chronic back pain.  Is followed by pain clinic.  Increased pain - in low back.  No pain radiating down leg.  No numbness or tingling.  Worsening recently - over the last 6 months.  Seeing chiropractor - f/u next week.  No chest pain or sob reported.  Eating.  No change - abdominal pain.  Bowels moving.  Handling stress.     Past Medical History:  Diagnosis Date  . Anemia    iron deficiency and B12 deficiency  . Arthritis   . Bronchitis 08/2015  . Chronic back pain    followed by Dr Primus Bravo  . Depression   . Diverticulosis   . Esophageal stricture    requiring dilatation x 2  . Fibromyalgia   . Fibromyalgia   . Fibromyalgia   . Follicular lymphoma ([place])    Followed by Dr Leretha Pol, s/p chemo and XRT  . Follicular lymphoma ([place])   . GERD (gastroesophageal reflux disease)   . GERD (gastroesophageal reflux disease)   . Hemorrhoid   . Hiatal hernia   . History of kidney stones   . Hypercholesterolemia   . IBS (irritable bowel syndrome)   . IBS (irritable bowel syndrome)   . Nephrolithiasis    followed by Dr Bernardo Heater, s/p stents  . PONV (postoperative nausea and vomiting)   . Shingles outbreak 11/26/2011  . Sleep apnea    Past Surgical History:  Procedure Laterality Date  . APPENDECTOMY    . BLADDER SURGERY  1993  . BREAST BIOPSY N/A   . capsule endoscopy    . CHOLECYSTECTOMY  1995  . COLONOSCOPY WITH PROPOFOL N/A 06/19/2017   Procedure:  COLONOSCOPY WITH PROPOFOL;  Surgeon: Manya Silvas, MD;  Location: [facility] ENDOSCOPY;  Service: Endoscopy;  Laterality: N/A;  . ESOPHAGOGASTRODUODENOSCOPY (EGD) WITH PROPOFOL N/A 06/19/2017   Procedure: ESOPHAGOGASTRODUODENOSCOPY (EGD) WITH PROPOFOL;  Surgeon: Manya Silvas, MD;  Location: [facility] ENDOSCOPY;  Service: Endoscopy;  Laterality: N/A;  . FRACTURE SURGERY    . history of helicobacter pylori infection    . JOINT REPLACEMENT    . LEFT OOPHORECTOMY    . LEG SURGERY Left   . ORIF ANKLE FRACTURE  2012  . OVARIAN CYST REMOVAL     right  . RECTOCELE REPAIR N/A   . REPLACEMENT TOTAL KNEE Right 03/19/2016  . TONSILECTOMY/ADENOIDECTOMY WITH MYRINGOTOMY  1970  . TONSILLECTOMY N/A   . TUBAL LIGATION  1976  . VAGINAL HYSTERECTOMY  1980's   secondary to bleeding   Family History  Problem Relation Age of Onset  . Ulcers Mother   . Ulcers Father   . Breast cancer Other        great aunt and grandfather  . Lung cancer Other        uncle  . Hypertension Maternal Grandmother   . Heart disease Maternal Grandmother        MI 75s  . Diabetes Maternal Grandmother   . Arthritis Maternal  Grandmother   . Diabetes Maternal Grandfather    Social History   Socioeconomic History  . Marital status: Married    Spouse name: Not on file  . Number of children: Not on file  . Years of education: Not on file  . Highest education level: Not on file  Occupational History  . Not on file  Tobacco Use  . Smoking status: Never Smoker  . Smokeless tobacco: Never Used  Vaping Use  . Vaping Use: Never used  Substance and Sexual Activity  . Alcohol use: No    Alcohol/week: 0.0 standard drinks  . Drug use: No  . Sexual activity: Yes  Other Topics Concern  . Not on file  Social History Narrative  . Not on file   Social Determinants of Health   Financial Resource Strain:   . Difficulty of Paying Living Expenses: Not on file  Food Insecurity:   . Worried About Charity fundraiser in the Last  Year: Not on file  . Ran Out of Food in the Last Year: Not on file  Transportation Needs:   . Lack of Transportation (Medical): Not on file  . Lack of Transportation (Non-Medical): Not on file  Physical Activity:   . Days of Exercise per Week: Not on file  . Minutes of Exercise per Session: Not on file  Stress:   . Feeling of Stress : Not on file  Social Connections:   . Frequency of Communication with Friends and Family: Not on file  . Frequency of Social Gatherings with Friends and Family: Not on file  . Attends Religious Services: Not on file  . Active Member of Clubs or Organizations: Not on file  . Attends Archivist Meetings: Not on file  . Marital Status: Not on file    Outpatient Encounter Medications as of 11/12/2019  Medication Sig  . aspirin EC 81 MG tablet Take by mouth.  Marland Kitchen atorvastatin (LIPITOR) 10 MG tablet Take 1 tablet (10 mg total) by mouth daily.  Marland Kitchen azelastine (ASTELIN) 0.1 % nasal spray ONER SPRAY IN EACH NOSTRIL TWICE DAILY AS DIRECTED  . calcium carbonate (TUMS - DOSED IN MG ELEMENTAL CALCIUM) 500 MG chewable tablet Chew 1 tablet by mouth as needed.  . cetirizine (ZYRTEC) 10 MG tablet Take by mouth.  . Diphenhyd-Hydrocort-Nystatin (FIRST-DUKES MOUTHWASH) SUSP Swish and spit 5 mLs tid prn  . estradiol (ESTRACE) 0.5 MG tablet TAKE ONE TABLET BY MOUTH DAILY  . fentaNYL (DURAGESIC) 100 MCG/HR Apply 1 patch to skin every 2 days with fentanyl patch 25 g per hour if tolerated  . fentaNYL (DURAGESIC) 25 MCG/HR patch Apply 1 patch to skin every 2 days with fentanyl patch 100 g per hour if tolerated  . fluticasone (FLONASE) 50 MCG/ACT nasal spray Place 2 sprays into both nostrils daily.  . furosemide (LASIX) 20 MG tablet TAKE ONE TABLET BY MOUTH EVERY DAY AS NEEDED  . mupirocin ointment (BACTROBAN) 2 % Apply to affected area bid.  Marland Kitchen omeprazole (PRILOSEC) 20 MG capsule TAKE ONE CAPSULE BY MOUTH TWICE A DAY  . oxyCODONE (ROXICODONE) 5 MG immediate release tablet  Limit 1 tab by mouth per day or twice per day if tolerated for breakthrough pain while wearing fentanyl patches  . senna-docusate (SENOKOT-S) 8.6-50 MG per tablet Take by mouth.   No facility-administered encounter medications on file as of 11/12/2019.    Review of Systems  Constitutional: Negative for appetite change and unexpected weight change.  HENT: Negative for congestion, sinus  pressure and sore throat.   Eyes: Negative for pain and visual disturbance.  Respiratory: Negative for cough, chest tightness and shortness of breath.   Cardiovascular: Negative for chest pain, palpitations and leg swelling.  Gastrointestinal: Negative for abdominal pain, diarrhea, nausea and vomiting.  Genitourinary: Negative for difficulty urinating and dysuria.  Musculoskeletal: Positive for back pain. Negative for joint swelling.  Skin: Negative for color change and rash.  Neurological: Negative for dizziness, light-headedness and headaches.  Hematological: Negative for adenopathy. Does not bruise/bleed easily.  Psychiatric/Behavioral: Negative for agitation and dysphoric mood.       Objective:    Physical Exam Vitals reviewed.  Constitutional:      General: She is not in acute distress.    Appearance: Normal appearance. She is well-developed.  HENT:     Head: Normocephalic and atraumatic.     Right Ear: External ear normal.     Left Ear: External ear normal.  Eyes:     General: No scleral icterus.       Right eye: No discharge.        Left eye: No discharge.     Conjunctiva/sclera: Conjunctivae normal.  Neck:     Thyroid: No thyromegaly.  Cardiovascular:     Rate and Rhythm: Normal rate and regular rhythm.  Pulmonary:     Effort: No tachypnea, accessory muscle usage or respiratory distress.     Breath sounds: Normal breath sounds. No decreased breath sounds or wheezing.  Chest:     Breasts:        Right: No inverted nipple, mass, nipple discharge or tenderness (no axillary adenopathy).         Left: No inverted nipple, mass, nipple discharge or tenderness (no axilarry adenopathy).  Abdominal:     General: Bowel sounds are normal.     Palpations: Abdomen is soft.     Tenderness: There is no abdominal tenderness.  Musculoskeletal:        General: No swelling or tenderness.     Cervical back: Neck supple. No tenderness.  Lymphadenopathy:     Cervical: No cervical adenopathy.  Skin:    Findings: No erythema or rash.  Neurological:     Mental Status: She is alert and oriented to person, place, and time.  Psychiatric:        Mood and Affect: Mood normal.        Behavior: Behavior normal.     BP 102/62   Pulse 71   Temp 98.3 F (36.8 C) (Oral)   Resp 16   Ht '5\' 7"'  (1.702 m)   Wt 165 lb 9.6 oz (75.1 kg)   SpO2 97%   BMI 25.94 kg/m  Wt Readings from Last 3 Encounters:  11/12/19 165 lb 9.6 oz (75.1 kg)  08/20/19 161 lb (73 kg)  06/30/19 161 lb 12.8 oz (73.4 kg)     Lab Results  Component Value Date   WBC 7.6 11/10/2019   HGB 13.5 11/10/2019   HCT 40.3 11/10/2019   PLT 202.0 11/10/2019   GLUCOSE 103 (H) 11/10/2019   CHOL 177 11/10/2019   TRIG 114.0 11/10/2019   HDL 55.60 11/10/2019   LDLDIRECT 164.2 02/11/2013   LDLCALC 99 11/10/2019   ALT 14 11/10/2019   AST 19 11/10/2019   NA 142 11/10/2019   K 4.4 11/10/2019   CL 99 11/10/2019   CREATININE 0.89 11/10/2019   BUN 11 11/10/2019   CO2 35 (H) 11/10/2019   TSH 1.36 12/25/2018   HGBA1C 5.7  11/10/2019    MR Brain W Wo Contrast  Result Date: 05/14/2017 CLINICAL DATA:  Headaches for 2 months.  Non intractable headache. EXAM: MRI HEAD WITHOUT AND WITH CONTRAST TECHNIQUE: Multiplanar, multiecho pulse sequences of the brain and surrounding structures were obtained without and with intravenous contrast. CONTRAST:  61m MULTIHANCE GADOBENATE DIMEGLUMINE 529 MG/ML IV SOLN COMPARISON:  None. FINDINGS: Brain: Periventricular and subcortical T2 hyperintensities bilaterally are mildly advanced for age. No acute  infarct, hemorrhage, or mass lesion is present. Ventricles are of normal size. Insert pass fluid The internal auditory canals are within normal limits bilaterally. The brainstem and cerebellum are normal. Vascular: Flow is present in the major intracranial arteries. Skull and upper cervical spine: The skull base is within normal limits. Craniocervical junction is normal. The upper cervical spine is within normal limits. Sinuses/Orbits: Paranasal sinuses and mastoid air cells are clear. Bilateral lens replacements are present. Globes and orbits are within limits. Other: None. IMPRESSION: 1. Periventricular and subcortical T2 hyperintensities bilaterally are mildly advanced for age. The finding is nonspecific but can be seen in the setting of chronic microvascular ischemia, a demyelinating process such as multiple sclerosis, vasculitis, complicated migraine headaches, or as the sequelae of a prior infectious or inflammatory process. 2. No acute or focal abnormality to explain headaches otherwise. Electronically Signed   By: CSan MorelleM.D.   On: 05/14/2017 10:13       Assessment & Plan:   Problem List Items Addressed This Visit    Non-Hodgkin's lymphoma ([place]    Followed by Dr DLeretha Pol  Stable.  Last evaluated 04/2019.  Recommended f/u in one year.       Low back pain radiating to both legs    Chronic pain.  Followed by pain clinic.  F/u with chiropractor next week.        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[facility]    Chronic back pain. Worsened recently.  Followed by pain clinic.  F/u with chiropractor next week.        Hyperglycemia    Low carb diet and exercise.  Follow met b and a1c.       Relevant Orders   Hemoglobin AC9F  Basic metabolic panel   Hypercholesterolemia    On lipitor.  Low cholesterol diet and exercise.  Follow lipid panel and liver function tests.        Relevant Orders   Hepatic function panel   Lipid panel   TSH   History of lymphoma    Followed  by Dr DLeretha Pol  Stable.  Evaluated 04/2019.  Recommended f/u in one year.        Health care maintenance    Physical today 11/12/19.  S/p hysterectomy.  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  [facility]  06/2017 - colonoscopy (Dr ETiffany Kocher - internal hemorrhoids/melanosis - recommended f/u in 5 years.        Chronic back pain - Primary   Relevant Orders   DG Lumbar Spine 2-3 Views (Completed)   Anemia    History of IDA and B12 def.  S/p colonoscopy and EGD.  Follow cbc.        Other Visit Diagnoses    Need for immunization against influenza       Relevant Orders   Flu Vaccine QUAD High Dose(Fluad) (Completed)       CEinar Pheasant MD

## 2019-11-16 ENCOUNTER — Telehealth: Payer: Self-pay | Admitting: Internal Medicine

## 2019-11-16 DIAGNOSIS — G894 Chronic pain syndrome: Secondary | ICD-10-CM | POA: Diagnosis not present

## 2019-11-16 DIAGNOSIS — M5136 Other intervertebral disc degeneration, lumbar region: Secondary | ICD-10-CM | POA: Diagnosis not present

## 2019-11-16 DIAGNOSIS — M542 Cervicalgia: Secondary | ICD-10-CM | POA: Diagnosis not present

## 2019-11-16 DIAGNOSIS — M5030 Other cervical disc degeneration, unspecified cervical region: Secondary | ICD-10-CM | POA: Diagnosis not present

## 2019-11-16 NOTE — Telephone Encounter (Signed)
As long as she has taken previously and tolerated - no contraindication known.  She will need to avoid getting vaccine while on steroids.

## 2019-11-16 NOTE — Telephone Encounter (Signed)
Patient called in stated that she went to the pain doctor and he wanted her to take a medrol pack but need Dr.Scott ok to take it

## 2019-11-16 NOTE — Telephone Encounter (Signed)
Patient is aware of below. 

## 2019-11-16 NOTE — Telephone Encounter (Signed)
Patient has taken prednisone before with no issues. Dr Primus Bravo at pain management ordered a medrol dose pack and sent to pharmacy for her back pain but just wanted patient to reach out to PCP to confirm ok to take.

## 2019-11-18 DIAGNOSIS — M9902 Segmental and somatic dysfunction of thoracic region: Secondary | ICD-10-CM | POA: Diagnosis not present

## 2019-11-18 DIAGNOSIS — M9903 Segmental and somatic dysfunction of lumbar region: Secondary | ICD-10-CM | POA: Diagnosis not present

## 2019-11-18 DIAGNOSIS — M9904 Segmental and somatic dysfunction of sacral region: Secondary | ICD-10-CM | POA: Diagnosis not present

## 2019-11-18 DIAGNOSIS — M4607 Spinal enthesopathy, lumbosacral region: Secondary | ICD-10-CM | POA: Diagnosis not present

## 2019-11-22 ENCOUNTER — Encounter: Payer: Self-pay | Admitting: Internal Medicine

## 2019-11-22 NOTE — Assessment & Plan Note (Signed)
Followed by Dr DeCastro.  Stable.  Last evaluated 04/2019.  Recommended f/u in one year.   

## 2019-11-22 NOTE — Assessment & Plan Note (Signed)
Chronic pain.  Followed by pain clinic.  F/u with chiropractor next week.

## 2019-11-22 NOTE — Assessment & Plan Note (Signed)
Chronic back pain. Worsened recently.  Followed by pain clinic.  F/u with chiropractor next week.

## 2019-11-22 NOTE — Assessment & Plan Note (Signed)
On lipitor.  Low cholesterol diet and exercise.  Follow lipid panel and liver function tests.   

## 2019-11-22 NOTE — Assessment & Plan Note (Signed)
Low carb diet and exercise.  Follow met b and a1c.  

## 2019-11-22 NOTE — Assessment & Plan Note (Signed)
History of IDA and B12 def.  S/p colonoscopy and EGD.  Follow cbc.

## 2019-11-22 NOTE — Assessment & Plan Note (Signed)
Followed by Dr Leretha Pol.  Stable.  Evaluated 04/2019.  Recommended f/u in one year.

## 2019-11-24 DIAGNOSIS — G894 Chronic pain syndrome: Secondary | ICD-10-CM | POA: Diagnosis not present

## 2019-12-02 DIAGNOSIS — M4607 Spinal enthesopathy, lumbosacral region: Secondary | ICD-10-CM | POA: Diagnosis not present

## 2019-12-02 DIAGNOSIS — M9904 Segmental and somatic dysfunction of sacral region: Secondary | ICD-10-CM | POA: Diagnosis not present

## 2019-12-02 DIAGNOSIS — M9903 Segmental and somatic dysfunction of lumbar region: Secondary | ICD-10-CM | POA: Diagnosis not present

## 2019-12-02 DIAGNOSIS — M9902 Segmental and somatic dysfunction of thoracic region: Secondary | ICD-10-CM | POA: Diagnosis not present

## 2019-12-14 DIAGNOSIS — M542 Cervicalgia: Secondary | ICD-10-CM | POA: Diagnosis not present

## 2019-12-14 DIAGNOSIS — M5030 Other cervical disc degeneration, unspecified cervical region: Secondary | ICD-10-CM | POA: Diagnosis not present

## 2019-12-14 DIAGNOSIS — M5136 Other intervertebral disc degeneration, lumbar region: Secondary | ICD-10-CM | POA: Diagnosis not present

## 2019-12-14 DIAGNOSIS — Z96651 Presence of right artificial knee joint: Secondary | ICD-10-CM | POA: Diagnosis not present

## 2019-12-24 DIAGNOSIS — G894 Chronic pain syndrome: Secondary | ICD-10-CM | POA: Diagnosis not present

## 2019-12-29 DIAGNOSIS — M4607 Spinal enthesopathy, lumbosacral region: Secondary | ICD-10-CM | POA: Diagnosis not present

## 2019-12-29 DIAGNOSIS — M9904 Segmental and somatic dysfunction of sacral region: Secondary | ICD-10-CM | POA: Diagnosis not present

## 2019-12-29 DIAGNOSIS — M9902 Segmental and somatic dysfunction of thoracic region: Secondary | ICD-10-CM | POA: Diagnosis not present

## 2019-12-29 DIAGNOSIS — M9903 Segmental and somatic dysfunction of lumbar region: Secondary | ICD-10-CM | POA: Diagnosis not present

## 2020-01-11 DIAGNOSIS — M25559 Pain in unspecified hip: Secondary | ICD-10-CM | POA: Diagnosis not present

## 2020-01-11 DIAGNOSIS — M79609 Pain in unspecified limb: Secondary | ICD-10-CM | POA: Diagnosis not present

## 2020-01-11 DIAGNOSIS — M542 Cervicalgia: Secondary | ICD-10-CM | POA: Diagnosis not present

## 2020-01-11 DIAGNOSIS — M25519 Pain in unspecified shoulder: Secondary | ICD-10-CM | POA: Diagnosis not present

## 2020-01-11 DIAGNOSIS — M5030 Other cervical disc degeneration, unspecified cervical region: Secondary | ICD-10-CM | POA: Diagnosis not present

## 2020-01-11 DIAGNOSIS — M48062 Spinal stenosis, lumbar region with neurogenic claudication: Secondary | ICD-10-CM | POA: Diagnosis not present

## 2020-01-11 DIAGNOSIS — M5136 Other intervertebral disc degeneration, lumbar region: Secondary | ICD-10-CM | POA: Diagnosis not present

## 2020-01-11 DIAGNOSIS — M4807 Spinal stenosis, lumbosacral region: Secondary | ICD-10-CM | POA: Diagnosis not present

## 2020-01-11 DIAGNOSIS — Z96651 Presence of right artificial knee joint: Secondary | ICD-10-CM | POA: Diagnosis not present

## 2020-01-11 DIAGNOSIS — G8929 Other chronic pain: Secondary | ICD-10-CM | POA: Diagnosis not present

## 2020-01-11 DIAGNOSIS — G4700 Insomnia, unspecified: Secondary | ICD-10-CM | POA: Diagnosis not present

## 2020-01-11 DIAGNOSIS — M47897 Other spondylosis, lumbosacral region: Secondary | ICD-10-CM | POA: Diagnosis not present

## 2020-01-11 DIAGNOSIS — F419 Anxiety disorder, unspecified: Secondary | ICD-10-CM | POA: Diagnosis not present

## 2020-01-11 DIAGNOSIS — M792 Neuralgia and neuritis, unspecified: Secondary | ICD-10-CM | POA: Diagnosis not present

## 2020-01-11 DIAGNOSIS — M4726 Other spondylosis with radiculopathy, lumbar region: Secondary | ICD-10-CM | POA: Diagnosis not present

## 2020-01-23 DIAGNOSIS — G894 Chronic pain syndrome: Secondary | ICD-10-CM | POA: Diagnosis not present

## 2020-01-26 DIAGNOSIS — M9902 Segmental and somatic dysfunction of thoracic region: Secondary | ICD-10-CM | POA: Diagnosis not present

## 2020-01-26 DIAGNOSIS — M9904 Segmental and somatic dysfunction of sacral region: Secondary | ICD-10-CM | POA: Diagnosis not present

## 2020-01-26 DIAGNOSIS — M9903 Segmental and somatic dysfunction of lumbar region: Secondary | ICD-10-CM | POA: Diagnosis not present

## 2020-02-03 ENCOUNTER — Other Ambulatory Visit: Payer: Self-pay | Admitting: Internal Medicine

## 2020-02-10 ENCOUNTER — Telehealth: Payer: Self-pay | Admitting: Internal Medicine

## 2020-02-10 DIAGNOSIS — Z1231 Encounter for screening mammogram for malignant neoplasm of breast: Secondary | ICD-10-CM

## 2020-02-10 NOTE — Telephone Encounter (Signed)
Pt called she needs a order for a Mammogram sent to [facility]

## 2020-02-15 DIAGNOSIS — M5136 Other intervertebral disc degeneration, lumbar region: Secondary | ICD-10-CM | POA: Diagnosis not present

## 2020-02-15 DIAGNOSIS — M542 Cervicalgia: Secondary | ICD-10-CM | POA: Diagnosis not present

## 2020-02-15 DIAGNOSIS — Z96651 Presence of right artificial knee joint: Secondary | ICD-10-CM | POA: Diagnosis not present

## 2020-02-15 DIAGNOSIS — M5030 Other cervical disc degeneration, unspecified cervical region: Secondary | ICD-10-CM | POA: Diagnosis not present

## 2020-02-17 NOTE — Addendum Note (Signed)
Addended by: Lars Masson on: 02/17/2020 09:41 AM   Modules accepted: Orders

## 2020-02-17 NOTE — Telephone Encounter (Signed)
Order placed for EKG

## 2020-02-22 DIAGNOSIS — G894 Chronic pain syndrome: Secondary | ICD-10-CM | POA: Diagnosis not present

## 2020-02-23 ENCOUNTER — Other Ambulatory Visit: Payer: Self-pay | Admitting: Internal Medicine

## 2020-03-10 ENCOUNTER — Other Ambulatory Visit (INDEPENDENT_AMBULATORY_CARE_PROVIDER_SITE_OTHER): Payer: Medicare Other

## 2020-03-10 ENCOUNTER — Other Ambulatory Visit: Payer: Self-pay

## 2020-03-10 DIAGNOSIS — R739 Hyperglycemia, unspecified: Secondary | ICD-10-CM

## 2020-03-10 DIAGNOSIS — E7800 Pure hypercholesterolemia, unspecified: Secondary | ICD-10-CM

## 2020-03-10 LAB — TSH: TSH: 1.97 u[IU]/mL (ref 0.35–4.50)

## 2020-03-10 LAB — LIPID PANEL
Cholesterol: 162 mg/dL (ref 0–200)
HDL: 58 mg/dL (ref >39.00)
LDL Cholesterol: 85 mg/dL (ref 0–99)
NonHDL: 103.71
Total CHOL/HDL Ratio: 3
Triglycerides: 95 mg/dL (ref 0.0–149.0)
VLDL: 19 mg/dL (ref 0.0–40.0)

## 2020-03-10 LAB — HEPATIC FUNCTION PANEL
ALT: 11 U/L (ref 0–35)
AST: 17 U/L (ref 0–37)
Albumin: 4.2 g/dL (ref 3.5–5.2)
Alkaline Phosphatase: 64 U/L (ref 39–117)
Bilirubin, Direct: 0.1 mg/dL (ref 0.0–0.3)
Total Bilirubin: 0.6 mg/dL (ref 0.2–1.2)
Total Protein: 6.6 g/dL (ref 6.0–8.3)

## 2020-03-10 LAB — BASIC METABOLIC PANEL
BUN: 13 mg/dL (ref 6–23)
CO2: 36 mEq/L — ABNORMAL HIGH (ref 19–32)
Calcium: 9.2 mg/dL (ref 8.4–10.5)
Chloride: 103 mEq/L (ref 96–112)
Creatinine, Ser: 0.79 mg/dL (ref 0.40–1.20)
GFR: 75.33 mL/min (ref >60.00)
Glucose, Bld: 93 mg/dL (ref 70–99)
Potassium: 4.2 mEq/L (ref 3.5–5.1)
Sodium: 141 mEq/L (ref 135–145)

## 2020-03-10 LAB — HEMOGLOBIN A1C: Hgb A1c MFr Bld: 5.6 % (ref 4.6–6.5)

## 2020-03-11 ENCOUNTER — Encounter: Payer: Self-pay | Admitting: Internal Medicine

## 2020-03-11 NOTE — Telephone Encounter (Signed)
LMTCB

## 2020-03-14 ENCOUNTER — Ambulatory Visit: Payer: Medicare Other | Admitting: Internal Medicine

## 2020-03-14 DIAGNOSIS — Z96651 Presence of right artificial knee joint: Secondary | ICD-10-CM | POA: Diagnosis not present

## 2020-03-14 DIAGNOSIS — M542 Cervicalgia: Secondary | ICD-10-CM | POA: Diagnosis not present

## 2020-03-14 DIAGNOSIS — M5136 Other intervertebral disc degeneration, lumbar region: Secondary | ICD-10-CM | POA: Diagnosis not present

## 2020-03-14 DIAGNOSIS — M5030 Other cervical disc degeneration, unspecified cervical region: Secondary | ICD-10-CM | POA: Diagnosis not present

## 2020-03-14 DIAGNOSIS — G894 Chronic pain syndrome: Secondary | ICD-10-CM | POA: Diagnosis not present

## 2020-03-14 NOTE — Telephone Encounter (Signed)
Pt called back returning your call °

## 2020-03-15 NOTE — Telephone Encounter (Signed)
Spoke with patient to let her know that I can schedule her mammogram and let her know when I have the date and time.

## 2020-03-23 DIAGNOSIS — G894 Chronic pain syndrome: Secondary | ICD-10-CM | POA: Diagnosis not present

## 2020-04-04 ENCOUNTER — Ambulatory Visit (INDEPENDENT_AMBULATORY_CARE_PROVIDER_SITE_OTHER): Payer: Medicare Other | Admitting: Internal Medicine

## 2020-04-04 ENCOUNTER — Other Ambulatory Visit: Payer: Self-pay

## 2020-04-04 DIAGNOSIS — E7800 Pure hypercholesterolemia, unspecified: Secondary | ICD-10-CM | POA: Diagnosis not present

## 2020-04-04 DIAGNOSIS — H9390 Unspecified disorder of ear, unspecified ear: Secondary | ICD-10-CM | POA: Diagnosis not present

## 2020-04-04 DIAGNOSIS — L989 Disorder of the skin and subcutaneous tissue, unspecified: Secondary | ICD-10-CM

## 2020-04-04 DIAGNOSIS — M79604 Pain in right leg: Secondary | ICD-10-CM | POA: Diagnosis not present

## 2020-04-04 DIAGNOSIS — C8590 Non-Hodgkin lymphoma, unspecified, unspecified site: Secondary | ICD-10-CM

## 2020-04-04 DIAGNOSIS — M79605 Pain in left leg: Secondary | ICD-10-CM | POA: Diagnosis not present

## 2020-04-04 DIAGNOSIS — M545 Low back pain, unspecified: Secondary | ICD-10-CM

## 2020-04-04 DIAGNOSIS — D649 Anemia, unspecified: Secondary | ICD-10-CM

## 2020-04-04 DIAGNOSIS — R739 Hyperglycemia, unspecified: Secondary | ICD-10-CM

## 2020-04-04 DIAGNOSIS — K219 Gastro-esophageal reflux disease without esophagitis: Secondary | ICD-10-CM | POA: Diagnosis not present

## 2020-04-04 MED ORDER — ESTRADIOL 0.5 MG PO TABS: 0.5000 mg | ORAL_TABLET | Freq: Every day | ORAL | 1 refills | Status: DC

## 2020-04-04 MED ORDER — FUROSEMIDE 20 MG PO TABS: 20.0000 mg | ORAL_TABLET | Freq: Every day | ORAL | 0 refills | Status: DC | PRN

## 2020-04-04 MED ORDER — MUPIROCIN 2 % EX OINT: TOPICAL_OINTMENT | CUTANEOUS | 0 refills | Status: DC

## 2020-04-04 NOTE — Progress Notes (Signed)
Patient ID: Maria Keller, female   DOB: Jun 20, 1950, 70 y.o.   MRN: [id number]   Subjective:    Patient ID: Maria Keller, female    DOB: 1950/12/08, 70 y.o.   MRN: [id number]  HPI This visit occurred during the SARS-CoV-2 public health emergency.  Safety protocols were in place, including screening questions prior to the visit, additional usage of staff PPE, and extensive cleaning of exam room while observing appropriate contact time as indicated for disinfecting solutions.  Patient here for a scheduled follow up. Has a history of follicular lymphoma followed by Dr Leretha Pol.  Last evaluated 04/2019.  Stable.  Recommended f/u in one year.  Has had chronic back pain.  Followed by pain clinic.  Stable.  Tries to stay as active as possible. No chest pain or sob reported.  No abdominal pain.  Bowels moving.  Does report intermittent lesion (sore spots) in her ears.  Better now.  Still some residual in left ear.  Also persistent lesion - under her chin.  Present since Christmas. Overall appears to be handling stress well.    Past Medical History:  Diagnosis Date  . Anemia    iron deficiency and B12 deficiency  . Arthritis   . Bronchitis 08/2015  . Chronic back pain    followed by Dr Primus Bravo  . Depression   . Diverticulosis   . Esophageal stricture    requiring dilatation x 2  . Fibromyalgia   . Fibromyalgia   . Fibromyalgia   . Follicular lymphoma ([place])    Followed by Dr Leretha Pol, s/p chemo and XRT  . Follicular lymphoma ([place])   . GERD (gastroesophageal reflux disease)   . GERD (gastroesophageal reflux disease)   . Hemorrhoid   . Hiatal hernia   . History of kidney stones   . Hypercholesterolemia   . IBS (irritable bowel syndrome)   . IBS (irritable bowel syndrome)   . Nephrolithiasis    followed by Dr Bernardo Heater, s/p stents  . PONV (postoperative nausea and vomiting)   . Shingles outbreak 11/26/2011  . Sleep apnea    Past Surgical History:  Procedure Laterality Date  . APPENDECTOMY     . BLADDER SURGERY  1993  . BREAST BIOPSY N/A   . capsule endoscopy    . CHOLECYSTECTOMY  1995  . COLONOSCOPY WITH PROPOFOL N/A 06/19/2017   Procedure: COLONOSCOPY WITH PROPOFOL;  Surgeon: Manya Silvas, MD;  Location: [facility] ENDOSCOPY;  Service: Endoscopy;  Laterality: N/A;  . ESOPHAGOGASTRODUODENOSCOPY (EGD) WITH PROPOFOL N/A 06/19/2017   Procedure: ESOPHAGOGASTRODUODENOSCOPY (EGD) WITH PROPOFOL;  Surgeon: Manya Silvas, MD;  Location: [facility] ENDOSCOPY;  Service: Endoscopy;  Laterality: N/A;  . FRACTURE SURGERY    . history of helicobacter pylori infection    . JOINT REPLACEMENT    . LEFT OOPHORECTOMY    . LEG SURGERY Left   . ORIF ANKLE FRACTURE  2012  . OVARIAN CYST REMOVAL     right  . RECTOCELE REPAIR N/A   . REPLACEMENT TOTAL KNEE Right 03/19/2016  . TONSILECTOMY/ADENOIDECTOMY WITH MYRINGOTOMY  1970  . TONSILLECTOMY N/A   . TUBAL LIGATION  1976  . VAGINAL HYSTERECTOMY  1980's   secondary to bleeding   Family History  Problem Relation Age of Onset  . Ulcers Mother   . Ulcers Father   . Breast cancer Other        great aunt and grandfather  . Lung cancer Other        uncle  . Hypertension  Maternal Grandmother   . Heart disease Maternal Grandmother        MI 74s  . Diabetes Maternal Grandmother   . Arthritis Maternal Grandmother   . Diabetes Maternal Grandfather    Social History   Socioeconomic History  . Marital status: Married    Spouse name: Not on file  . Number of children: Not on file  . Years of education: Not on file  . Highest education level: Not on file  Occupational History  . Not on file  Tobacco Use  . Smoking status: Never Smoker  . Smokeless tobacco: Never Used  Vaping Use  . Vaping Use: Never used  Substance and Sexual Activity  . Alcohol use: No    Alcohol/week: 0.0 standard drinks  . Drug use: No  . Sexual activity: Yes  Other Topics Concern  . Not on file  Social History Narrative  . Not on file   Social Determinants of  Health   Financial Resource Strain: Not on file  Food Insecurity: Not on file  Transportation Needs: Not on file  Physical Activity: Not on file  Stress: Not on file  Social Connections: Not on file    Outpatient Encounter Medications as of 04/04/2020  Medication Sig  . aspirin EC 81 MG tablet Take by mouth.  Marland Kitchen atorvastatin (LIPITOR) 10 MG tablet TAKE ONE TABLET EVERY DAY  . azelastine (ASTELIN) 0.1 % nasal spray ONER SPRAY IN EACH NOSTRIL TWICE DAILY AS DIRECTED  . calcium carbonate (TUMS - DOSED IN MG ELEMENTAL CALCIUM) 500 MG chewable tablet Chew 1 tablet by mouth as needed.  . cetirizine (ZYRTEC) 10 MG tablet Take by mouth.  . Diphenhyd-Hydrocort-Nystatin (FIRST-DUKES MOUTHWASH) SUSP Swish and spit 5 mLs tid prn  . estradiol (ESTRACE) 0.5 MG tablet Take 1 tablet (0.5 mg total) by mouth daily.  . fentaNYL (DURAGESIC) 100 MCG/HR Apply 1 patch to skin every 2 days with fentanyl patch 25 g per hour if tolerated  . fentaNYL (DURAGESIC) 25 MCG/HR patch Apply 1 patch to skin every 2 days with fentanyl patch 100 g per hour if tolerated  . fluticasone (FLONASE) 50 MCG/ACT nasal spray Place 2 sprays into both nostrils daily.  . furosemide (LASIX) 20 MG tablet Take 1 tablet (20 mg total) by mouth daily as needed.  . mupirocin ointment (BACTROBAN) 2 % Apply to affected area bid.  Marland Kitchen omeprazole (PRILOSEC) 20 MG capsule TAKE ONE CAPSULE BY MOUTH TWICE A DAY  . oxyCODONE (ROXICODONE) 5 MG immediate release tablet Limit 1 tab by mouth per day or twice per day if tolerated for breakthrough pain while wearing fentanyl patches  . senna-docusate (SENOKOT-S) 8.6-50 MG per tablet Take by mouth.  . [DISCONTINUED] estradiol (ESTRACE) 0.5 MG tablet TAKE ONE TABLET BY MOUTH DAILY  . [DISCONTINUED] furosemide (LASIX) 20 MG tablet TAKE ONE TABLET BY MOUTH EVERY DAY AS NEEDED  . [DISCONTINUED] mupirocin ointment (BACTROBAN) 2 % Apply to affected area bid.   No facility-administered encounter medications on  file as of 04/04/2020.    Review of Systems     Objective:    Physical Exam  BP 104/70   Pulse 74   Temp 98.2 F (36.8 C) (Oral)   Resp 16   Ht '5\' 7"'  (1.702 m)   Wt 169 lb (76.7 kg)   SpO2 97%   BMI 26.47 kg/m  Wt Readings from Last 3 Encounters:  04/04/20 169 lb (76.7 kg)  11/12/19 165 lb 9.6 oz (75.1 kg)  08/20/19 161 lb (  73 kg)     Lab Results  Component Value Date   WBC 7.6 11/10/2019   HGB 13.5 11/10/2019   HCT 40.3 11/10/2019   PLT 202.0 11/10/2019   GLUCOSE 93 03/10/2020   CHOL 162 03/10/2020   TRIG 95.0 03/10/2020   HDL 58.00 03/10/2020   LDLDIRECT 164.2 02/11/2013   LDLCALC 85 03/10/2020   ALT 11 03/10/2020   AST 17 03/10/2020   NA 141 03/10/2020   K 4.2 03/10/2020   CL 103 03/10/2020   CREATININE 0.79 03/10/2020   BUN 13 03/10/2020   CO2 36 (H) 03/10/2020   TSH 1.97 03/10/2020   HGBA1C 5.6 03/10/2020    MR Brain W Wo Contrast  Result Date: 05/14/2017 CLINICAL DATA:  Headaches for 2 months.  Non intractable headache. EXAM: MRI HEAD WITHOUT AND WITH CONTRAST TECHNIQUE: Multiplanar, multiecho pulse sequences of the brain and surrounding structures were obtained without and with intravenous contrast. CONTRAST:  69m MULTIHANCE GADOBENATE DIMEGLUMINE 529 MG/ML IV SOLN COMPARISON:  None. FINDINGS: Brain: Periventricular and subcortical T2 hyperintensities bilaterally are mildly advanced for age. No acute infarct, hemorrhage, or mass lesion is present. Ventricles are of normal size. Insert pass fluid The internal auditory canals are within normal limits bilaterally. The brainstem and cerebellum are normal. Vascular: Flow is present in the major intracranial arteries. Skull and upper cervical spine: The skull base is within normal limits. Craniocervical junction is normal. The upper cervical spine is within normal limits. Sinuses/Orbits: Paranasal sinuses and mastoid air cells are clear. Bilateral lens replacements are present. Globes and orbits are within limits.  Other: None. IMPRESSION: 1. Periventricular and subcortical T2 hyperintensities bilaterally are mildly advanced for age. The finding is nonspecific but can be seen in the setting of chronic microvascular ischemia, a demyelinating process such as multiple sclerosis, vasculitis, complicated migraine headaches, or as the sequelae of a prior infectious or inflammatory process. 2. No acute or focal abnormality to explain headaches otherwise. Electronically Signed   By: CSan MorelleM.D.   On: 05/14/2017 10:13       Assessment & Plan:   Problem List Items Addressed This Visit    Anemia    S/p colonoscopy and EGD.  History of IDA and B12 def.  Follow cbc.        Ear lesion    Ear lesion - minimal redness - inside ear.  Bactroban.  Call with update.       GERD (gastroesophageal reflux disease)    No upper symptoms reported.  On omeprazole.        Hypercholesterolemia    On lipitor.  Tolerating.  Takes breaks prn, but overall taking regularly. Cholesterol has improved.  Hold on increasing dose.  Continue low cholesterol diet and exercise.  Follow lipid panel and liver function tests.   Lab Results  Component Value Date   CHOL 162 03/10/2020   HDL 58.00 03/10/2020   LDLCALC 85 03/10/2020   LDLDIRECT 164.2 02/11/2013   TRIG 95.0 03/10/2020   CHOLHDL 3 03/10/2020        Relevant Medications   furosemide (LASIX) 20 MG tablet   Other Relevant Orders   Hepatic function panel   Lipid panel   Basic metabolic panel   Hyperglycemia    Low carb diet and exercise.  Follow met b and a1c.        Relevant Orders   Hemoglobin A1c   Low back pain radiating to both legs    Chronic pain.  Followed by pain  clinic.  Stable.        Non-Hodgkin's lymphoma ([place])    Followed by Dr Leretha Pol.  Stable.  Last evaluated 04/2019.  Recommended f/u in one year.        Skin lesion    Located under her chin.  Bactroban.  Follow.  Call with update.           Einar Pheasant, MD

## 2020-04-06 DIAGNOSIS — C8238 Follicular lymphoma grade IIIa, lymph nodes of multiple sites: Secondary | ICD-10-CM | POA: Diagnosis not present

## 2020-04-06 DIAGNOSIS — K219 Gastro-esophageal reflux disease without esophagitis: Secondary | ICD-10-CM | POA: Diagnosis not present

## 2020-04-10 ENCOUNTER — Encounter: Payer: Self-pay | Admitting: Internal Medicine

## 2020-04-10 DIAGNOSIS — H9390 Unspecified disorder of ear, unspecified ear: Secondary | ICD-10-CM | POA: Insufficient documentation

## 2020-04-10 NOTE — Assessment & Plan Note (Signed)
Low carb diet and exercise.  Follow met b and a1c.   

## 2020-04-10 NOTE — Assessment & Plan Note (Signed)
Followed by Dr Leretha Pol.  Stable.  Last evaluated 04/2019.  Recommended f/u in one year.

## 2020-04-10 NOTE — Assessment & Plan Note (Signed)
Located under her chin.  Bactroban.  Follow.  Call with update.

## 2020-04-10 NOTE — Assessment & Plan Note (Signed)
No upper symptoms reported.  On omeprazole.  

## 2020-04-10 NOTE — Assessment & Plan Note (Signed)
On lipitor.  Tolerating.  Takes breaks prn, but overall taking regularly. Cholesterol has improved.  Hold on increasing dose.  Continue low cholesterol diet and exercise.  Follow lipid panel and liver function tests.   Lab Results  Component Value Date   CHOL 162 03/10/2020   HDL 58.00 03/10/2020   LDLCALC 85 03/10/2020   LDLDIRECT 164.2 02/11/2013   TRIG 95.0 03/10/2020   CHOLHDL 3 03/10/2020

## 2020-04-10 NOTE — Assessment & Plan Note (Signed)
Chronic pain.  Followed by pain clinic.  Stable.

## 2020-04-10 NOTE — Assessment & Plan Note (Signed)
Ear lesion - minimal redness - inside ear.  Bactroban.  Call with update.

## 2020-04-10 NOTE — Assessment & Plan Note (Signed)
S/p colonoscopy and EGD.  History of IDA and B12 def.  Follow cbc.

## 2020-04-11 DIAGNOSIS — G894 Chronic pain syndrome: Secondary | ICD-10-CM | POA: Diagnosis not present

## 2020-04-22 DIAGNOSIS — G894 Chronic pain syndrome: Secondary | ICD-10-CM | POA: Diagnosis not present

## 2020-05-03 ENCOUNTER — Other Ambulatory Visit: Payer: Self-pay | Admitting: Internal Medicine

## 2020-05-09 DIAGNOSIS — G894 Chronic pain syndrome: Secondary | ICD-10-CM | POA: Diagnosis not present

## 2020-05-11 DIAGNOSIS — Z1231 Encounter for screening mammogram for malignant neoplasm of breast: Secondary | ICD-10-CM | POA: Diagnosis not present

## 2020-05-22 DIAGNOSIS — G894 Chronic pain syndrome: Secondary | ICD-10-CM | POA: Diagnosis not present

## 2020-06-16 ENCOUNTER — Other Ambulatory Visit: Payer: Self-pay | Admitting: Internal Medicine

## 2020-07-01 ENCOUNTER — Other Ambulatory Visit: Payer: Self-pay

## 2020-07-01 ENCOUNTER — Other Ambulatory Visit (INDEPENDENT_AMBULATORY_CARE_PROVIDER_SITE_OTHER): Payer: Medicare Other

## 2020-07-01 DIAGNOSIS — R739 Hyperglycemia, unspecified: Secondary | ICD-10-CM

## 2020-07-01 DIAGNOSIS — E7800 Pure hypercholesterolemia, unspecified: Secondary | ICD-10-CM

## 2020-07-01 LAB — LIPID PANEL
Cholesterol: 194 mg/dL (ref 0–200)
HDL: 57.2 mg/dL (ref >39.00)
LDL Cholesterol: 117 mg/dL — ABNORMAL HIGH (ref 0–99)
NonHDL: 136.47
Total CHOL/HDL Ratio: 3
Triglycerides: 97 mg/dL (ref 0.0–149.0)
VLDL: 19.4 mg/dL (ref 0.0–40.0)

## 2020-07-01 LAB — HEPATIC FUNCTION PANEL
ALT: 28 U/L (ref 0–35)
AST: 33 U/L (ref 0–37)
Albumin: 4.2 g/dL (ref 3.5–5.2)
Alkaline Phosphatase: 61 U/L (ref 39–117)
Bilirubin, Direct: 0.1 mg/dL (ref 0.0–0.3)
Total Bilirubin: 0.7 mg/dL (ref 0.2–1.2)
Total Protein: 6.9 g/dL (ref 6.0–8.3)

## 2020-07-01 LAB — BASIC METABOLIC PANEL
BUN: 14 mg/dL (ref 6–23)
CO2: 35 mEq/L — ABNORMAL HIGH (ref 19–32)
Calcium: 9.4 mg/dL (ref 8.4–10.5)
Chloride: 99 mEq/L (ref 96–112)
Creatinine, Ser: 0.81 mg/dL (ref 0.40–1.20)
GFR: 72.95 mL/min (ref >60.00)
Glucose, Bld: 93 mg/dL (ref 70–99)
Potassium: 3.8 mEq/L (ref 3.5–5.1)
Sodium: 140 mEq/L (ref 135–145)

## 2020-07-01 LAB — HEMOGLOBIN A1C: Hgb A1c MFr Bld: 5.6 % (ref 4.6–6.5)

## 2020-07-04 DIAGNOSIS — G894 Chronic pain syndrome: Secondary | ICD-10-CM | POA: Diagnosis not present

## 2020-07-04 DIAGNOSIS — G4700 Insomnia, unspecified: Secondary | ICD-10-CM | POA: Diagnosis not present

## 2020-07-04 DIAGNOSIS — F419 Anxiety disorder, unspecified: Secondary | ICD-10-CM | POA: Diagnosis not present

## 2020-07-04 DIAGNOSIS — M4807 Spinal stenosis, lumbosacral region: Secondary | ICD-10-CM | POA: Diagnosis not present

## 2020-07-04 DIAGNOSIS — M9943 Connective tissue stenosis of neural canal of lumbar region: Secondary | ICD-10-CM | POA: Diagnosis not present

## 2020-07-04 DIAGNOSIS — M48062 Spinal stenosis, lumbar region with neurogenic claudication: Secondary | ICD-10-CM | POA: Diagnosis not present

## 2020-07-04 DIAGNOSIS — M9973 Connective tissue and disc stenosis of intervertebral foramina of lumbar region: Secondary | ICD-10-CM | POA: Diagnosis not present

## 2020-07-04 DIAGNOSIS — M792 Neuralgia and neuritis, unspecified: Secondary | ICD-10-CM | POA: Diagnosis not present

## 2020-07-04 DIAGNOSIS — M4726 Other spondylosis with radiculopathy, lumbar region: Secondary | ICD-10-CM | POA: Diagnosis not present

## 2020-07-04 DIAGNOSIS — G8929 Other chronic pain: Secondary | ICD-10-CM | POA: Diagnosis not present

## 2020-07-04 DIAGNOSIS — M79609 Pain in unspecified limb: Secondary | ICD-10-CM | POA: Diagnosis not present

## 2020-07-04 DIAGNOSIS — M25559 Pain in unspecified hip: Secondary | ICD-10-CM | POA: Diagnosis not present

## 2020-07-04 DIAGNOSIS — M47897 Other spondylosis, lumbosacral region: Secondary | ICD-10-CM | POA: Diagnosis not present

## 2020-07-05 ENCOUNTER — Encounter: Payer: Self-pay | Admitting: Internal Medicine

## 2020-07-05 ENCOUNTER — Ambulatory Visit (INDEPENDENT_AMBULATORY_CARE_PROVIDER_SITE_OTHER): Payer: Medicare Other | Admitting: Internal Medicine

## 2020-07-05 ENCOUNTER — Other Ambulatory Visit: Payer: Self-pay

## 2020-07-05 ENCOUNTER — Other Ambulatory Visit: Payer: Self-pay | Admitting: Internal Medicine

## 2020-07-05 DIAGNOSIS — M4696 Unspecified inflammatory spondylopathy, lumbar region: Secondary | ICD-10-CM | POA: Diagnosis not present

## 2020-07-05 DIAGNOSIS — C8590 Non-Hodgkin lymphoma, unspecified, unspecified site: Secondary | ICD-10-CM | POA: Diagnosis not present

## 2020-07-05 DIAGNOSIS — E7800 Pure hypercholesterolemia, unspecified: Secondary | ICD-10-CM

## 2020-07-05 DIAGNOSIS — K219 Gastro-esophageal reflux disease without esophagitis: Secondary | ICD-10-CM

## 2020-07-05 DIAGNOSIS — R202 Paresthesia of skin: Secondary | ICD-10-CM

## 2020-07-05 DIAGNOSIS — R739 Hyperglycemia, unspecified: Secondary | ICD-10-CM

## 2020-07-05 DIAGNOSIS — D649 Anemia, unspecified: Secondary | ICD-10-CM

## 2020-07-05 NOTE — Progress Notes (Signed)
Patient ID: Maria Keller, female   DOB: 11/29/1950, 70 y.o.   MRN: [id number]   Subjective:    Patient ID: Maria Keller, female    DOB: 11/17/1950, 70 y.o.   MRN: [id number]  HPI This visit occurred during the SARS-CoV-2 public health emergency.  Safety protocols were in place, including screening questions prior to the visit, additional usage of staff PPE, and extensive cleaning of exam room while observing appropriate contact time as indicated for disinfecting solutions.  Patient here for a scheduled follow up.  Here to follow up regarding her cholesterol. Has follicular lymphoma.  Sees oncology.  Last evaluated 04/2020.  Stable.  Following yearly.  Has chronic back pain.  Followed by pain clinic.  Stable.  Tries to stay as active as possible.  No chest pain or sob reported.  No abdominal pain.  Bowels moving.  She does report sensation change - left frontal/forehead.  Localized - where she had shingles.  No headache.  Present over the last two weeks.  Discussed further w/up and evaluation.  She declines.  Wants to follow.    Past Medical History:  Diagnosis Date  . Anemia    iron deficiency and B12 deficiency  . Arthritis   . Bronchitis 08/2015  . Chronic back pain    followed by Dr Crisp  . Depression   . Diverticulosis   . Esophageal stricture    requiring dilatation x 2  . Fibromyalgia   . Fibromyalgia   . Fibromyalgia   . Follicular lymphoma ([facility])    Followed by Dr DeCastro, s/p chemo and XRT  . Follicular lymphoma ([facility])   . GERD (gastroesophageal reflux disease)   . GERD (gastroesophageal reflux disease)   . Hemorrhoid   . Hiatal hernia   . History of kidney stones   . Hypercholesterolemia   . IBS (irritable bowel syndrome)   . IBS (irritable bowel syndrome)   . Nephrolithiasis    followed by Dr Stoioff, s/p stents  . PONV (postoperative nausea and vomiting)   . Shingles outbreak 11/26/2011  . Sleep apnea    Past Surgical History:  Procedure Laterality Date  .  APPENDECTOMY    . BLADDER SURGERY  1993  . BREAST BIOPSY N/A   . capsule endoscopy    . CHOLECYSTECTOMY  1995  . COLONOSCOPY WITH PROPOFOL N/A 06/19/2017   Procedure: COLONOSCOPY WITH PROPOFOL;  Surgeon: Elliott, Robert T, MD;  Location: [facility] ENDOSCOPY;  Service: Endoscopy;  Laterality: N/A;  . ESOPHAGOGASTRODUODENOSCOPY (EGD) WITH PROPOFOL N/A 06/19/2017   Procedure: ESOPHAGOGASTRODUODENOSCOPY (EGD) WITH PROPOFOL;  Surgeon: Elliott, Robert T, MD;  Location: [facility] ENDOSCOPY;  Service: Endoscopy;  Laterality: N/A;  . FRACTURE SURGERY    . history of helicobacter pylori infection    . JOINT REPLACEMENT    . LEFT OOPHORECTOMY    . LEG SURGERY Left   . ORIF ANKLE FRACTURE  2012  . OVARIAN CYST REMOVAL     right  . RECTOCELE REPAIR N/A   . REPLACEMENT TOTAL KNEE Right 03/19/2016  . TONSILECTOMY/ADENOIDECTOMY WITH MYRINGOTOMY  1970  . TONSILLECTOMY N/A   . TUBAL LIGATION  1976  . VAGINAL HYSTERECTOMY  1980's   secondary to bleeding   Family History  Problem Relation Age of Onset  . Ulcers Mother   . Ulcers Father   . Breast cancer Other        great aunt and grandfather  . Lung cancer Other        uncle  .   Hypertension Maternal Grandmother   . Heart disease Maternal Grandmother        MI 66s  . Diabetes Maternal Grandmother   . Arthritis Maternal Grandmother   . Diabetes Maternal Grandfather    Social History   Socioeconomic History  . Marital status: Married    Spouse name: Not on file  . Number of children: Not on file  . Years of education: Not on file  . Highest education level: Not on file  Occupational History  . Not on file  Tobacco Use  . Smoking status: Never Smoker  . Smokeless tobacco: Never Used  Vaping Use  . Vaping Use: Never used  Substance and Sexual Activity  . Alcohol use: No    Alcohol/week: 0.0 standard drinks  . Drug use: No  . Sexual activity: Yes  Other Topics Concern  . Not on file  Social History Narrative  . Not on file   Social  Determinants of Health   Financial Resource Strain: Not on file  Food Insecurity: Not on file  Transportation Needs: Not on file  Physical Activity: Not on file  Stress: Not on file  Social Connections: Not on file    Outpatient Encounter Medications as of 07/05/2020  Medication Sig  . aspirin EC 81 MG tablet Take by mouth.  Marland Kitchen atorvastatin (LIPITOR) 10 MG tablet TAKE ONE TABLET EVERY DAY  . azelastine (ASTELIN) 0.1 % nasal spray ONER SPRAY IN EACH NOSTRIL TWICE DAILY AS DIRECTED  . calcium carbonate (TUMS - DOSED IN MG ELEMENTAL CALCIUM) 500 MG chewable tablet Chew 1 tablet by mouth as needed.  . cetirizine (ZYRTEC) 10 MG tablet Take by mouth.  . Diphenhyd-Hydrocort-Nystatin (FIRST-DUKES MOUTHWASH) SUSP Swish and spit 5 mLs tid prn  . estradiol (ESTRACE) 0.5 MG tablet Take 1 tablet (0.5 mg total) by mouth daily.  . fentaNYL (DURAGESIC) 100 MCG/HR Apply 1 patch to skin every 2 days with fentanyl patch 25 g per hour if tolerated  . fentaNYL (DURAGESIC) 25 MCG/HR patch Apply 1 patch to skin every 2 days with fentanyl patch 100 g per hour if tolerated  . fluticasone (FLONASE) 50 MCG/ACT nasal spray Place 2 sprays into both nostrils daily.  . mupirocin ointment (BACTROBAN) 2 % Apply to affected area bid.  Marland Kitchen omeprazole (PRILOSEC) 20 MG capsule TAKE ONE CAPSULE BY MOUTH TWICE A DAY  . oxyCODONE (ROXICODONE) 5 MG immediate release tablet Limit 1 tab by mouth per day or twice per day if tolerated for breakthrough pain while wearing fentanyl patches  . senna-docusate (SENOKOT-S) 8.6-50 MG per tablet Take by mouth.  . [DISCONTINUED] furosemide (LASIX) 20 MG tablet Take 1 tablet (20 mg total) by mouth daily as needed.   No facility-administered encounter medications on file as of 07/05/2020.    Review of Systems  Constitutional: Negative for appetite change and unexpected weight change.  HENT: Negative for congestion and sinus pressure.   Respiratory: Negative for cough, chest tightness and  shortness of breath.   Cardiovascular: Negative for chest pain, palpitations and leg swelling.  Gastrointestinal: Negative for abdominal pain, diarrhea, nausea and vomiting.  Genitourinary: Negative for difficulty urinating and dysuria.  Musculoskeletal: Negative for joint swelling and myalgias.       Chronic back pain as outlined.    Skin: Negative for color change and rash.  Neurological: Negative for dizziness, light-headedness and headaches.  Psychiatric/Behavioral: Negative for agitation and dysphoric mood.       Objective:    Physical Exam Vitals reviewed.  Constitutional:      General: She is not in acute distress.    Appearance: Normal appearance.  HENT:     Head: Normocephalic and atraumatic.     Right Ear: External ear normal.     Left Ear: External ear normal.  Eyes:     General: No scleral icterus.       Right eye: No discharge.        Left eye: No discharge.     Conjunctiva/sclera: Conjunctivae normal.  Neck:     Thyroid: No thyromegaly.  Cardiovascular:     Rate and Rhythm: Normal rate and regular rhythm.  Pulmonary:     Effort: No respiratory distress.     Breath sounds: Normal breath sounds. No wheezing.  Abdominal:     General: Bowel sounds are normal.     Palpations: Abdomen is soft.     Tenderness: There is no abdominal tenderness.  Musculoskeletal:        General: No swelling or tenderness.     Cervical back: Neck supple. No tenderness.  Lymphadenopathy:     Cervical: No cervical adenopathy.  Skin:    Findings: No erythema or rash.  Neurological:     Mental Status: She is alert.  Psychiatric:        Mood and Affect: Mood normal.        Behavior: Behavior normal.     BP 120/70   Pulse 78   Temp 97.8 F (36.6 C)   Resp 16   Ht 5' 7" (1.702 m)   Wt 165 lb (74.8 kg)   SpO2 98%   BMI 25.84 kg/m  Wt Readings from Last 3 Encounters:  07/05/20 165 lb (74.8 kg)  04/04/20 169 lb (76.7 kg)  11/12/19 165 lb 9.6 oz (75.1 kg)     Lab  Results  Component Value Date   WBC 7.6 11/10/2019   HGB 13.5 11/10/2019   HCT 40.3 11/10/2019   PLT 202.0 11/10/2019   GLUCOSE 93 07/01/2020   CHOL 194 07/01/2020   TRIG 97.0 07/01/2020   HDL 57.20 07/01/2020   LDLDIRECT 164.2 02/11/2013   LDLCALC 117 (H) 07/01/2020   ALT 28 07/01/2020   AST 33 07/01/2020   NA 140 07/01/2020   K 3.8 07/01/2020   CL 99 07/01/2020   CREATININE 0.81 07/01/2020   BUN 14 07/01/2020   CO2 35 (H) 07/01/2020   TSH 1.97 03/10/2020   HGBA1C 5.6 07/01/2020    MR Brain W Wo Contrast  Result Date: 05/14/2017 CLINICAL DATA:  Headaches for 2 months.  Non intractable headache. EXAM: MRI HEAD WITHOUT AND WITH CONTRAST TECHNIQUE: Multiplanar, multiecho pulse sequences of the brain and surrounding structures were obtained without and with intravenous contrast. CONTRAST:  15mL MULTIHANCE GADOBENATE DIMEGLUMINE 529 MG/ML IV SOLN COMPARISON:  None. FINDINGS: Brain: Periventricular and subcortical T2 hyperintensities bilaterally are mildly advanced for age. No acute infarct, hemorrhage, or mass lesion is present. Ventricles are of normal size. Insert pass fluid The internal auditory canals are within normal limits bilaterally. The brainstem and cerebellum are normal. Vascular: Flow is present in the major intracranial arteries. Skull and upper cervical spine: The skull base is within normal limits. Craniocervical junction is normal. The upper cervical spine is within normal limits. Sinuses/Orbits: Paranasal sinuses and mastoid air cells are clear. Bilateral lens replacements are present. Globes and orbits are within limits. Other: None. IMPRESSION: 1. Periventricular and subcortical T2 hyperintensities bilaterally are mildly advanced for age. The finding is nonspecific but   can be seen in the setting of chronic microvascular ischemia, a demyelinating process such as multiple sclerosis, vasculitis, complicated migraine headaches, or as the sequelae of a prior infectious or  inflammatory process. 2. No acute or focal abnormality to explain headaches otherwise. Electronically Signed   By: San Morelle M.D.   On: 05/14/2017 10:13       Assessment & Plan:   Problem List Items Addressed This Visit    Anemia    S/p colonoscopy and EGD.  Follow cbc.       GERD (gastroesophageal reflux disease)    No upper symptoms reported.  On omeprazole.        Hypercholesterolemia    Continue lipitor.  Low cholesterol diet and exercise.  Follow lipid panel and liver function tests.  Lab Results  Component Value Date   CHOL 194 07/01/2020   HDL 57.20 07/01/2020   LDLCALC 117 (H) 07/01/2020   LDLDIRECT 164.2 02/11/2013   TRIG 97.0 07/01/2020   CHOLHDL 3 07/01/2020        Relevant Orders   Hepatic function panel   Lipid panel   Basic metabolic panel   Hyperglycemia    Low carb diet and exercise.  Follow met b and a1c.       Relevant Orders   Hemoglobin A1c   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[facility])    Chronic back pain.  Followed by pain clinic.       Non-Hodgkin's lymphoma ([place])    Followed by Dr Leretha Pol.  Last evaluated 04/2020.  Stable.  Follow up in one year recommended.       Tingling sensation    Sensation change forehead.  Localized in same are where had shingles.  No headache.  Discussed further w/up and evaluation.  She wants to monitor.  Will notify me if persistent.           Einar Pheasant, MD

## 2020-07-11 ENCOUNTER — Encounter: Payer: Self-pay | Admitting: Internal Medicine

## 2020-07-11 DIAGNOSIS — R202 Paresthesia of skin: Secondary | ICD-10-CM | POA: Insufficient documentation

## 2020-07-11 NOTE — Assessment & Plan Note (Signed)
Sensation change forehead.  Localized in same are where had shingles.  No headache.  Discussed further w/up and evaluation.  She wants to monitor.  Will notify me if persistent.

## 2020-07-11 NOTE — Assessment & Plan Note (Signed)
Chronic back pain.  Followed by pain clinic.  

## 2020-07-11 NOTE — Assessment & Plan Note (Signed)
Followed by Dr Leretha Pol.  Last evaluated 04/2020.  Stable.  Follow up in one year recommended.

## 2020-07-11 NOTE — Assessment & Plan Note (Signed)
S/p colonoscopy and EGD.  Follow cbc.

## 2020-07-11 NOTE — Assessment & Plan Note (Signed)
Low carb diet and exercise.  Follow met b and a1c.  

## 2020-07-11 NOTE — Assessment & Plan Note (Signed)
No upper symptoms reported.  On omeprazole.  

## 2020-07-11 NOTE — Assessment & Plan Note (Signed)
Continue lipitor.  Low cholesterol diet and exercise.  Follow lipid panel and liver function tests.  Lab Results  Component Value Date   CHOL 194 07/01/2020   HDL 57.20 07/01/2020   LDLCALC 117 (H) 07/01/2020   LDLDIRECT 164.2 02/11/2013   TRIG 97.0 07/01/2020   CHOLHDL 3 07/01/2020

## 2020-07-21 DIAGNOSIS — Z03818 Encounter for observation for suspected exposure to other biological agents ruled out: Secondary | ICD-10-CM | POA: Diagnosis not present

## 2020-07-21 DIAGNOSIS — B9689 Other specified bacterial agents as the cause of diseases classified elsewhere: Secondary | ICD-10-CM | POA: Diagnosis not present

## 2020-07-21 DIAGNOSIS — G894 Chronic pain syndrome: Secondary | ICD-10-CM | POA: Diagnosis not present

## 2020-07-21 DIAGNOSIS — J029 Acute pharyngitis, unspecified: Secondary | ICD-10-CM | POA: Diagnosis not present

## 2020-07-21 DIAGNOSIS — Z20822 Contact with and (suspected) exposure to covid-19: Secondary | ICD-10-CM | POA: Diagnosis not present

## 2020-07-21 DIAGNOSIS — J019 Acute sinusitis, unspecified: Secondary | ICD-10-CM | POA: Diagnosis not present

## 2020-07-22 ENCOUNTER — Telehealth: Payer: Self-pay | Admitting: Internal Medicine

## 2020-07-22 MED ORDER — DOXYCYCLINE HYCLATE 100 MG PO TABS: 100.0000 mg | ORAL_TABLET | Freq: Two times a day (BID) | ORAL | 0 refills | Status: DC

## 2020-07-22 NOTE — Telephone Encounter (Signed)
Discussed with Maria Keller.  She has taken doxycycline previously and tolerated.  Rx sent in for doxycycline.  Instructed to take probiotic as directed.

## 2020-07-22 NOTE — Addendum Note (Signed)
Addended by: Alisa Graff on: 07/22/2020 03:41 PM   Modules accepted: Orders

## 2020-07-22 NOTE — Telephone Encounter (Signed)
Patient went to urgent care yesterday and they gave her a Zpack for a sinus infection. Patient advised urgent care that Zpacks don't work on her. They stilled prescribed a Zpack. Patient is requesting a antibiotic that will work for her.

## 2020-07-26 ENCOUNTER — Other Ambulatory Visit: Payer: Self-pay | Admitting: Internal Medicine

## 2020-08-01 DIAGNOSIS — G894 Chronic pain syndrome: Secondary | ICD-10-CM | POA: Diagnosis not present

## 2020-08-01 DIAGNOSIS — M545 Low back pain, unspecified: Secondary | ICD-10-CM | POA: Diagnosis not present

## 2020-08-01 DIAGNOSIS — M5136 Other intervertebral disc degeneration, lumbar region: Secondary | ICD-10-CM | POA: Diagnosis not present

## 2020-08-01 DIAGNOSIS — M542 Cervicalgia: Secondary | ICD-10-CM | POA: Diagnosis not present

## 2020-08-05 ENCOUNTER — Other Ambulatory Visit: Payer: Self-pay | Admitting: Internal Medicine

## 2020-08-20 DIAGNOSIS — G894 Chronic pain syndrome: Secondary | ICD-10-CM | POA: Diagnosis not present

## 2020-08-20 DIAGNOSIS — M179 Osteoarthritis of knee, unspecified: Secondary | ICD-10-CM | POA: Diagnosis not present

## 2020-08-22 ENCOUNTER — Ambulatory Visit (INDEPENDENT_AMBULATORY_CARE_PROVIDER_SITE_OTHER): Payer: Medicare Other

## 2020-08-22 VITALS — Ht 67.0 in | Wt 165.0 lb

## 2020-08-22 DIAGNOSIS — Z0000 Encounter for general adult medical examination without abnormal findings: Secondary | ICD-10-CM | POA: Diagnosis not present

## 2020-08-22 NOTE — Patient Instructions (Addendum)
[name] , Thank you for taking time to come for your Medicare Wellness Visit. I appreciate your ongoing commitment to your health goals. Please review the following plan we discussed and let me know if I can assist you in the future.   These are the goals we discussed:  Goals       Patient Stated     Maintain Healthy Lifestyle (pt-stated)      Stay active Healthy diet         This is a list of the screening recommended for you and due dates:  Health Maintenance  Topic Date Due   COVID-19 Vaccine (3 - Moderna risk series) 02/04/2021*   Flu Shot  09/05/2020   Mammogram  05/12/2022   Colon Cancer Screening  06/20/2022   Tetanus Vaccine  10/15/2028   DEXA scan (bone density measurement)  Completed   Hepatitis C Screening: USPSTF Recommendation to screen - Ages 71-79 yo.  Completed   Pneumonia vaccines  Completed   Zoster (Shingles) Vaccine  Completed   HPV Vaccine  Aged Out  *Topic was postponed. The date shown is not the original due date.    Advanced directives: End of life planning; Advance aging; Advanced directives discussed.  Copy of current HCPOA/Living Will requested.    Conditions/risks identified: none new  Follow up in one year for your annual wellness visit    Preventive Care 65 Years and Older, Female Preventive care refers to lifestyle choices and visits with your health care provider that can promote health and wellness. What does preventive care include? A yearly physical exam. This is also called an annual well check. Dental exams once or twice a year. Routine eye exams. Ask your health care provider how often you should have your eyes checked. Personal lifestyle choices, including: Daily care of your teeth and gums. Regular physical activity. Eating a healthy diet. Avoiding tobacco and drug use. Limiting alcohol use. Practicing safe sex. Taking low-dose aspirin every day. Taking vitamin and mineral supplements as recommended by your health care  provider. What happens during an annual well check? The services and screenings done by your health care provider during your annual well check will depend on your age, overall health, lifestyle risk factors, and family history of disease. Counseling  Your health care provider may ask you questions about your: Alcohol use. Tobacco use. Drug use. Emotional well-being. Home and relationship well-being. Sexual activity. Eating habits. History of falls. Memory and ability to understand (cognition). Work and work Statistician. Reproductive health. Screening  You may have the following tests or measurements: Height, weight, and BMI. Blood pressure. Lipid and cholesterol levels. These may be checked every 5 years, or more frequently if you are over 48 years old. Skin check. Lung cancer screening. You may have this screening every year starting at age 56 if you have a 30-pack-year history of smoking and currently smoke or have quit within the past 15 years. Fecal occult blood test (FOBT) of the stool. You may have this test every year starting at age 40. Flexible sigmoidoscopy or colonoscopy. You may have a sigmoidoscopy every 5 years or a colonoscopy every 10 years starting at age 51. Hepatitis C blood test. Hepatitis B blood test. Sexually transmitted disease (STD) testing. Diabetes screening. This is done by checking your blood sugar (glucose) after you have not eaten for a while (fasting). You may have this done every 1-3 years. Bone density scan. This is done to screen for osteoporosis. You may have this  done starting at age 69. Mammogram. This may be done every 1-2 years. Talk to your health care provider about how often you should have regular mammograms. Talk with your health care provider about your test results, treatment options, and if necessary, the need for more tests. Vaccines  Your health care provider may recommend certain vaccines, such as: Influenza vaccine. This is  recommended every year. Tetanus, diphtheria, and acellular pertussis (Tdap, Td) vaccine. You may need a Td booster every 10 years. Zoster vaccine. You may need this after age 76. Pneumococcal 13-valent conjugate (PCV13) vaccine. One dose is recommended after age 87. Pneumococcal polysaccharide (PPSV23) vaccine. One dose is recommended after age 58. Talk to your health care provider about which screenings and vaccines you need and how often you need them. This information is not intended to replace advice given to you by your health care provider. Make sure you discuss any questions you have with your health care provider. Document Released: 02/18/2015 Document Revised: 10/12/2015 Document Reviewed: 11/23/2014 Elsevier Interactive Patient Education  2017 [place] Prevention in the Home Falls can cause injuries. They can happen to people of all ages. There are many things you can do to make your home safe and to help prevent falls. What can I do on the outside of my home? Regularly fix the edges of walkways and driveways and fix any cracks. Remove anything that might make you trip as you walk through a door, such as a raised step or threshold. Trim any bushes or trees on the path to your home. Use bright outdoor lighting. Clear any walking paths of anything that might make someone trip, such as rocks or tools. Regularly check to see if handrails are loose or broken. Make sure that both sides of any steps have handrails. Any raised decks and porches should have guardrails on the edges. Have any leaves, snow, or ice cleared regularly. Use sand or salt on walking paths during winter. Clean up any spills in your garage right away. This includes oil or grease spills. What can I do in the bathroom? Use night lights. Install grab bars by the toilet and in the tub and shower. Do not use towel bars as grab bars. Use non-skid mats or decals in the tub or shower. If you need to sit down in  the shower, use a plastic, non-slip stool. Keep the floor dry. Clean up any water that spills on the floor as soon as it happens. Remove soap buildup in the tub or shower regularly. Attach bath mats securely with double-sided non-slip rug tape. Do not have throw rugs and other things on the floor that can make you trip. What can I do in the bedroom? Use night lights. Make sure that you have a light by your bed that is easy to reach. Do not use any sheets or blankets that are too big for your bed. They should not hang down onto the floor. Have a firm chair that has side arms. You can use this for support while you get dressed. Do not have throw rugs and other things on the floor that can make you trip. What can I do in the kitchen? Clean up any spills right away. Avoid walking on wet floors. Keep items that you use a lot in easy-to-reach places. If you need to reach something above you, use a strong step stool that has a grab bar. Keep electrical cords out of the way. Do not use floor polish or wax  that makes floors slippery. If you must use wax, use non-skid floor wax. Do not have throw rugs and other things on the floor that can make you trip. What can I do with my stairs? Do not leave any items on the stairs. Make sure that there are handrails on both sides of the stairs and use them. Fix handrails that are broken or loose. Make sure that handrails are as long as the stairways. Check any carpeting to make sure that it is firmly attached to the stairs. Fix any carpet that is loose or worn. Avoid having throw rugs at the top or bottom of the stairs. If you do have throw rugs, attach them to the floor with carpet tape. Make sure that you have a light switch at the top of the stairs and the bottom of the stairs. If you do not have them, ask someone to add them for you. What else can I do to help prevent falls? Wear shoes that: Do not have high heels. Have rubber bottoms. Are comfortable  and fit you well. Are closed at the toe. Do not wear sandals. If you use a stepladder: Make sure that it is fully opened. Do not climb a closed stepladder. Make sure that both sides of the stepladder are locked into place. Ask someone to hold it for you, if possible. Clearly mark and make sure that you can see: Any grab bars or handrails. First and last steps. Where the edge of each step is. Use tools that help you move around (mobility aids) if they are needed. These include: Canes. Walkers. Scooters. Crutches. Turn on the lights when you go into a dark area. Replace any light bulbs as soon as they burn out. Set up your furniture so you have a clear path. Avoid moving your furniture around. If any of your floors are uneven, fix them. If there are any pets around you, be aware of where they are. Review your medicines with your doctor. Some medicines can make you feel dizzy. This can increase your chance of falling. Ask your doctor what other things that you can do to help prevent falls. This information is not intended to replace advice given to you by your health care provider. Make sure you discuss any questions you have with your health care provider. Document Released: 11/18/2008 Document Revised: 06/30/2015 Document Reviewed: 02/26/2014 Elsevier Interactive Patient Education  2017 [employer].

## 2020-08-22 NOTE — Progress Notes (Signed)
Subjective:   Maria Keller is a 70 y.o. female who presents for Medicare Annual (Subsequent) preventive examination.  Review of Systems    No ROS.  Medicare Wellness Virtual Visit.  Visual/audio telehealth visit, UTA vital signs.   See social history for additional risk factors.   Cardiac Risk Factors include: advanced age (>36men, >57 women)     Objective:    Today's Vitals   08/22/20 1121  Weight: 165 lb (74.8 kg)  Height: 5\' 7"  (1.702 m)   Body mass index is 25.84 kg/m.  Advanced Directives 08/22/2020 08/20/2019 08/19/2018 06/19/2017 04/29/2017 04/26/2016 10/12/2015  Does Patient Have a Medical Advance Directive? Yes Yes Yes Yes Yes Yes Yes  Type of Paramedic of [place];Living will [place];Living will [place];Living will - [place];Living will [place];Living will Living will  Does patient want to make changes to medical advance directive? No - Patient declined No - Patient declined No - Patient declined - No - Patient declined No - Patient declined -  Copy of [place] in Chart? No - copy requested No - copy requested No - copy requested - No - copy requested No - copy requested -    Current Medications (verified) Outpatient Encounter Medications as of 08/22/2020  Medication Sig   aspirin EC 81 MG tablet Take by mouth.   atorvastatin (LIPITOR) 10 MG tablet TAKE ONE TABLET EVERY DAY   azelastine (ASTELIN) 0.1 % nasal spray ONER SPRAY IN EACH NOSTRIL TWICE DAILY AS DIRECTED   calcium carbonate (TUMS - DOSED IN MG ELEMENTAL CALCIUM) 500 MG chewable tablet Chew 1 tablet by mouth as needed.   cetirizine (ZYRTEC) 10 MG tablet Take by mouth.   Diphenhyd-Hydrocort-Nystatin (FIRST-DUKES MOUTHWASH) SUSP Swish and spit 5 mLs tid prn   estradiol (ESTRACE) 0.5 MG tablet TAKE 1 TABLET BY MOUTH DAILY.   fentaNYL (DURAGESIC) 100 MCG/HR Apply 1 patch to skin every 2 days  with fentanyl patch 25 g per hour if tolerated   fentaNYL (DURAGESIC) 25 MCG/HR patch Apply 1 patch to skin every 2 days with fentanyl patch 100 g per hour if tolerated   fluticasone (FLONASE) 50 MCG/ACT nasal spray Place 2 sprays into both nostrils daily.   furosemide (LASIX) 20 MG tablet TAKE 1 TABLET BY MOUTH DAILY AS NEEDED.   mupirocin ointment (BACTROBAN) 2 % Apply to affected area bid.   omeprazole (PRILOSEC) 20 MG capsule TAKE 1 CAPSULE BY MOUTH TWICE DAILY   oxyCODONE (ROXICODONE) 5 MG immediate release tablet Limit 1 tab by mouth per day or twice per day if tolerated for breakthrough pain while wearing fentanyl patches   senna-docusate (SENOKOT-S) 8.6-50 MG per tablet Take by mouth.   [DISCONTINUED] doxycycline (VIBRA-TABS) 100 MG tablet Take 1 tablet (100 mg total) by mouth 2 (two) times daily.   No facility-administered encounter medications on file as of 08/22/2020.    Allergies (verified) Oxycodone hcl, Oxycontin [oxycodone hcl], Penicillins, Chloral hydrate, Ciprofloxacin, Ciprofloxacin hcl, Colace [docusate calcium], Methotrexate derivatives, Parafon forte dsc [chlorzoxazone], Penicillin g, Tape, Bextra [valdecoxib], Oxycodone, and Rofecoxib   History: Past Medical History:  Diagnosis Date   Anemia    iron deficiency and B12 deficiency   Arthritis    Bronchitis 08/2015   Chronic back pain    followed by Dr Primus Bravo   Depression    Diverticulosis    Esophageal stricture    requiring dilatation x 2   Fibromyalgia  Fibromyalgia    Fibromyalgia    Follicular lymphoma ([place])    Followed by Dr Leretha Pol, s/p chemo and XRT   Follicular lymphoma ([place])    GERD (gastroesophageal reflux disease)    GERD (gastroesophageal reflux disease)    Hemorrhoid    Hiatal hernia    History of kidney stones    Hypercholesterolemia    IBS (irritable bowel syndrome)    IBS (irritable bowel syndrome)    Nephrolithiasis    followed by Dr Bernardo Heater, s/p stents   PONV (postoperative nausea  and vomiting)    Shingles outbreak 11/26/2011   Sleep apnea    Past Surgical History:  Procedure Laterality Date   APPENDECTOMY     BLADDER SURGERY  1993   BREAST BIOPSY N/A    capsule endoscopy     CHOLECYSTECTOMY  1995   COLONOSCOPY WITH PROPOFOL N/A 06/19/2017   Procedure: COLONOSCOPY WITH PROPOFOL;  Surgeon: Manya Silvas, MD;  Location[facility] ENDOSCOPY;  Service: Endoscopy;  Laterality: N/A;   ESOPHAGOGASTRODUODENOSCOPY (EGD) WITH PROPOFOL N/A 06/19/2017   Procedure: ESOPHAGOGASTRODUODENOSCOPY (EGD) WITH PROPOFOL;  Surgeon: Manya Silvas, MD;  [facility] END[facility]COPY;  Service: Endoscopy;  Laterality: N/A;   FRACTURE SURGERY     history of helicobacter pylori infection     JOINT REPLACEMENT     LEFT OOPHORECTOMY     LEG SURGERY Left    ORIF ANKLE FRACTURE  2012   OVARIAN CYST REMOVAL     right   RECTOCELE REPAIR N/A    REPLACEMENT TOTAL KNEE Right 03/19/2016   TONSILECTOMY/ADENOIDECTOMY WITH MYRINGOTOMY  1970   TONSILLECTOMY N/A    TUBAL LIGATION  1976   VAGINAL HYSTERECTOMY  1980's   secondary to bleeding   Family History  Problem Relation Age of Onset   Ulcers Mother    Ulcers Father    Breast cancer Other        great aunt and grandfather   Lung cancer Other        uncle   Hypertension Maternal Grandmother    Heart disease Maternal Grandmother        MI 72s   Diabetes Maternal Grandmother    Arthritis Maternal Grandmother    Diabetes Maternal Grandfather    Social History   Socioeconomic History   Marital status: Married    Spouse name: Not on file   Number of children: Not on file   Years of education: Not on file   Highest education level: Not on file  Occupational History   Not on file  Tobacco Use   Smoking status: Never   Smokeless tobacco: Never  Vaping Use   Vaping Use: Never used  Substance and Sexual Activity   Alcohol use: No    Alcohol/week: 0.0 standard drinks   Drug use: No   Sexual activity: Yes  Other Topics Concern   Not  on file  Social History Narrative   Not on file   Social Determinants of Health   Financial Resource Strain: Low Risk    Difficulty of Paying Living Expenses: Not hard at all  Food Insecurity: No Food Insecurity   Worried About Charity fundraiser in the Last Year: Never true   Arboriculturist in the Last Year: Never true  Transportation Needs: No Transportation Needs   Lack of Transportation (Medical): No   Lack of Transportation (Non-Medical): No  Physical Activity: Not on file  Stress: No Stress Concern Present   Feeling of Stress :  Not at all  Social Connections: Socially Integrated   Frequency of Communication with Friends and Family: More than three times a week   Frequency of Social Gatherings with Friends and Family: More than three times a week   Attends Religious Services: More than 4 times per year   Active Member of [employer] or Organizations: Not on file   Attends Music therapist: More than 4 times per year   Marital Status: Married    Tobacco Counseling Counseling given: Not Answered   Clinical Intake:  Pre-visit preparation completed: Yes        Diabetes: No  How often do you need to have someone help you when you read instructions, pamphlets, or other written materials from your doctor or pharmacy?: 1 - Never   Interpreter Needed?: No      Activities of Daily Living In your present state of health, do you have any difficulty performing the following activities: 08/22/2020  Hearing? N  Vision? N  Difficulty concentrating or making decisions? N  Walking or climbing stairs? N  Dressing or bathing? N  Doing errands, shopping? N  Preparing Food and eating ? N  Using the Toilet? N  In the past six months, have you accidently leaked urine? N  Do you have problems with loss of bowel control? N  Managing your Medications? N  Managing your Finances? N  Housekeeping or managing your Housekeeping? N  Some recent data might be hidden     Patient Care Team: Einar Pheasant, MD as PCP - General (Internal Medicine)  Indicate any recent Medical Services you may have received from other than [facility] providers in the past year (date may be approximate).     Assessment:   This is a routine wellness examination for [place].  I connected with Maria Keller today by telephone and verified that I am speaking with the correct person using two identifiers. Location patient: home Location provider: work Persons participating in the virtual visit: patient, Marine scientist.    I discussed the limitations, risks, security and privacy concerns of performing an evaluation and management service by telephone and the availability of in person appointments. The patient expressed understanding and verbally consented to this telephonic visit.    Interactive audio and video telecommunications were attempted between this provider and patient, however failed, due to patient having technical difficulties OR patient did not have access to video capability.  We continued and completed visit with audio only.  Some vital signs may be absent or patient reported.   Hearing/Vision screen Hearing Screening - Comments:: Patient is able to hear conversational tones without difficulty.  No issues reported. Vision Screening - Comments:: Wears corrective lenses They have seen their ophthalmologist in the last 12 months.    Dietary issues and exercise activities discussed: Current Exercise Habits: Home exercise routine, Intensity: Mild Healthy diet Good water intake   Goals Addressed               This Visit's Progress     Patient Stated     Maintain Healthy Lifestyle (pt-stated)        Stay active Healthy diet        Depression Screen PHQ 2/9 Scores 08/22/2020 07/05/2020 08/20/2019 06/30/2019 08/19/2018 04/29/2017 04/26/2016  PHQ - 2 Score 0 0 0 0 0 0 0  Exception Documentation - - - - - - -    Fall Risk Fall Risk  08/22/2020 07/05/2020 06/30/2019 03/04/2019  12/30/2018  Falls in the past year? 0 0  0 0 0  Comment - - - - Emmi Telephone Survey: data to providers prior to load  Number falls in past yr: - - 0 - -  Injury with Fall? - - 0 - -  Risk for fall due to : - - - - -  Risk for fall due to: Comment - - - - -  Follow up Falls evaluation completed Falls evaluation completed Falls evaluation completed Falls evaluation completed -    FALL RISK PREVENTION PERTAINING TO THE HOME: Adequate lighting in your home to reduce risk of falls? Yes   ASSISTIVE DEVICES UTILIZED TO PREVENT FALLS: Life alert? No  Use of a cane, walker or w/c? No   TIMED UP AND GO: Was the test performed? No .   Cognitive Function: Patient is alert and oriented x3.  Enjoys brain health games.  Denies difficulty focusing, making decisions, memory loss.  MMSE/6CIT deferred. Normal by direct communication/observation.   MMSE - Mini Mental State Exam 04/29/2017 04/26/2016 04/27/2015  Orientation to time 5 5 5   Orientation to Place 5 5 5   Registration 3 3 3   Attention/ Calculation 5 5 5   Recall 3 3 3   Language- name 2 objects 2 2 2   Language- repeat 1 1 1   Language- follow 3 step command 3 3 3   Language- read & follow direction 1 1 1   Write a sentence 1 1 1   Copy design 1 1 1   Total score 30 30 30      6CIT Screen 08/20/2019 08/19/2018  What Year? - 0 points  What month? - 0 points  What time? - 0 points  Count back from 20 - 0 points  Months in reverse 0 points 0 points  Repeat phrase - 0 points  Total Score - 0    Immunizations Immunization History  Administered Date(s) Administered   Fluad Quad(high Dose 65+) 10/28/2018, 11/12/2019   Influenza Split 02/25/2012   Influenza, High Dose Seasonal PF 11/17/2015, 01/07/2017, 02/01/2018   Influenza,inj,Quad PF,6+ Mos 02/22/2014, 10/27/2014   Influenza-Unspecified 02/29/2012, 10/28/2012, 02/22/2014, 10/27/2014, 11/17/2015   Moderna Sars-Covid-2 Vaccination 03/20/2019, 04/20/2019   Pneumococcal Conjugate-13  03/02/2015   Pneumococcal Polysaccharide-23 01/07/2017   Tdap 10/16/2018   Zoster Recombinat (Shingrix) 10/16/2017, 12/24/2017   Zoster, Live 11/06/2005   Health Maintenance Health Maintenance  Topic Date Due   COVID-19 Vaccine (3 - Moderna risk series) 02/04/2021 (Originally 05/18/2019)   INFLUENZA VACCINE  09/05/2020   MAMMOGRAM  05/12/2022   COLONOSCOPY (Pts 45-38yrs Insurance coverage will need to be confirmed)  06/20/2022   TETANUS/TDAP  10/15/2028   DEXA SCAN  Completed   Hepatitis C Screening  Completed   PNA vac Low Risk Adult  Completed   Zoster Vaccines- Shingrix  Completed   HPV VACCINES  Aged Out   Covid vaccine- 2 completed  Lung Cancer Screening: (Low Dose CT Chest recommended if Age 86-80 years, 30 pack-year currently smoking OR have quit w/in 15years.) does not qualify.   Vision Screening: Recommended annual ophthalmology exams for early detection of glaucoma and other disorders of the eye. Is the patient up to date with their annual eye exam?  Yes   Dental Screening: Recommended annual dental exams for proper oral hygiene  Community Resource Referral / Chronic Care Management: CRR required this visit?  No   CCM required this visit?  No      Plan:   Keep all routine maintenance appointments.   I have personally reviewed and noted the following in the patient's chart:  Medical and social history Use of alcohol, tobacco or illicit drugs  Current medications and supplements including opioid prescriptions. Patient is currently taking opioid. Followed by pain management monthly. Managed by Dr. Primus Bravo.  Functional ability and status Nutritional status Physical activity Advanced directives List of other physicians Hospitalizations, surgeries, and ER visits in previous 12 months Vitals Screenings to include cognitive, depression, and falls Referrals and appointments  In addition, I have reviewed and discussed with patient certain preventive protocols,  quality metrics, and best practice recommendations. A written personalized care plan for preventive services as well as general preventive health recommendations were provided to patient via mychart.     Varney Biles, LPN   6/33/3545

## 2020-08-29 DIAGNOSIS — M545 Low back pain, unspecified: Secondary | ICD-10-CM | POA: Diagnosis not present

## 2020-08-29 DIAGNOSIS — M5136 Other intervertebral disc degeneration, lumbar region: Secondary | ICD-10-CM | POA: Diagnosis not present

## 2020-08-29 DIAGNOSIS — M259 Joint disorder, unspecified: Secondary | ICD-10-CM | POA: Diagnosis not present

## 2020-08-29 DIAGNOSIS — G894 Chronic pain syndrome: Secondary | ICD-10-CM | POA: Diagnosis not present

## 2020-09-19 DIAGNOSIS — G894 Chronic pain syndrome: Secondary | ICD-10-CM | POA: Diagnosis not present

## 2020-09-26 DIAGNOSIS — G894 Chronic pain syndrome: Secondary | ICD-10-CM | POA: Diagnosis not present

## 2020-09-26 DIAGNOSIS — M545 Low back pain, unspecified: Secondary | ICD-10-CM | POA: Diagnosis not present

## 2020-09-26 DIAGNOSIS — M259 Joint disorder, unspecified: Secondary | ICD-10-CM | POA: Diagnosis not present

## 2020-09-26 DIAGNOSIS — M5136 Other intervertebral disc degeneration, lumbar region: Secondary | ICD-10-CM | POA: Diagnosis not present

## 2020-10-05 ENCOUNTER — Other Ambulatory Visit: Payer: Self-pay | Admitting: Internal Medicine

## 2020-10-19 DIAGNOSIS — G894 Chronic pain syndrome: Secondary | ICD-10-CM | POA: Diagnosis not present

## 2020-10-19 DIAGNOSIS — M259 Joint disorder, unspecified: Secondary | ICD-10-CM | POA: Diagnosis not present

## 2020-10-24 DIAGNOSIS — M259 Joint disorder, unspecified: Secondary | ICD-10-CM | POA: Diagnosis not present

## 2020-10-24 DIAGNOSIS — M25559 Pain in unspecified hip: Secondary | ICD-10-CM | POA: Diagnosis not present

## 2020-10-24 DIAGNOSIS — M4807 Spinal stenosis, lumbosacral region: Secondary | ICD-10-CM | POA: Diagnosis not present

## 2020-10-24 DIAGNOSIS — G894 Chronic pain syndrome: Secondary | ICD-10-CM | POA: Diagnosis not present

## 2020-10-24 DIAGNOSIS — M79609 Pain in unspecified limb: Secondary | ICD-10-CM | POA: Diagnosis not present

## 2020-10-24 DIAGNOSIS — M47897 Other spondylosis, lumbosacral region: Secondary | ICD-10-CM | POA: Diagnosis not present

## 2020-10-24 DIAGNOSIS — G4700 Insomnia, unspecified: Secondary | ICD-10-CM | POA: Diagnosis not present

## 2020-10-24 DIAGNOSIS — G8929 Other chronic pain: Secondary | ICD-10-CM | POA: Diagnosis not present

## 2020-10-24 DIAGNOSIS — M545 Low back pain, unspecified: Secondary | ICD-10-CM | POA: Diagnosis not present

## 2020-10-24 DIAGNOSIS — M5136 Other intervertebral disc degeneration, lumbar region: Secondary | ICD-10-CM | POA: Diagnosis not present

## 2020-10-24 DIAGNOSIS — F419 Anxiety disorder, unspecified: Secondary | ICD-10-CM | POA: Diagnosis not present

## 2020-10-24 DIAGNOSIS — M9931 Osseous stenosis of neural canal of cervical region: Secondary | ICD-10-CM | POA: Diagnosis not present

## 2020-11-02 ENCOUNTER — Other Ambulatory Visit: Payer: Self-pay

## 2020-11-02 ENCOUNTER — Other Ambulatory Visit (INDEPENDENT_AMBULATORY_CARE_PROVIDER_SITE_OTHER): Payer: Medicare Other

## 2020-11-02 DIAGNOSIS — E7800 Pure hypercholesterolemia, unspecified: Secondary | ICD-10-CM | POA: Diagnosis not present

## 2020-11-02 DIAGNOSIS — R739 Hyperglycemia, unspecified: Secondary | ICD-10-CM

## 2020-11-02 LAB — BASIC METABOLIC PANEL
BUN: 10 mg/dL (ref 6–23)
CO2: 36 mEq/L — ABNORMAL HIGH (ref 19–32)
Calcium: 9.3 mg/dL (ref 8.4–10.5)
Chloride: 99 mEq/L (ref 96–112)
Creatinine, Ser: 0.81 mg/dL (ref 0.40–1.20)
GFR: 72.77 mL/min (ref >60.00)
Glucose, Bld: 100 mg/dL — ABNORMAL HIGH (ref 70–99)
Potassium: 4.2 mEq/L (ref 3.5–5.1)
Sodium: 141 mEq/L (ref 135–145)

## 2020-11-02 LAB — HEPATIC FUNCTION PANEL
ALT: 23 U/L (ref 0–35)
AST: 30 U/L (ref 0–37)
Albumin: 4.2 g/dL (ref 3.5–5.2)
Alkaline Phosphatase: 65 U/L (ref 39–117)
Bilirubin, Direct: 0.1 mg/dL (ref 0.0–0.3)
Total Bilirubin: 0.8 mg/dL (ref 0.2–1.2)
Total Protein: 6.8 g/dL (ref 6.0–8.3)

## 2020-11-02 LAB — LIPID PANEL
Cholesterol: 186 mg/dL (ref 0–200)
HDL: 55.6 mg/dL (ref >39.00)
LDL Cholesterol: 106 mg/dL — ABNORMAL HIGH (ref 0–99)
NonHDL: 130.37
Total CHOL/HDL Ratio: 3
Triglycerides: 120 mg/dL (ref 0.0–149.0)
VLDL: 24 mg/dL (ref 0.0–40.0)

## 2020-11-02 LAB — HEMOGLOBIN A1C: Hgb A1c MFr Bld: 5.6 % (ref 4.6–6.5)

## 2020-11-03 ENCOUNTER — Other Ambulatory Visit: Payer: Self-pay | Admitting: Internal Medicine

## 2020-11-04 ENCOUNTER — Other Ambulatory Visit: Payer: Self-pay

## 2020-11-04 ENCOUNTER — Ambulatory Visit (INDEPENDENT_AMBULATORY_CARE_PROVIDER_SITE_OTHER): Payer: Medicare Other | Admitting: Internal Medicine

## 2020-11-04 VITALS — BP 118/72 | HR 89 | Temp 97.8°F | Resp 16 | Ht 67.0 in | Wt 168.0 lb

## 2020-11-04 DIAGNOSIS — K589 Irritable bowel syndrome without diarrhea: Secondary | ICD-10-CM

## 2020-11-04 DIAGNOSIS — R739 Hyperglycemia, unspecified: Secondary | ICD-10-CM

## 2020-11-04 DIAGNOSIS — M545 Low back pain, unspecified: Secondary | ICD-10-CM | POA: Diagnosis not present

## 2020-11-04 DIAGNOSIS — K219 Gastro-esophageal reflux disease without esophagitis: Secondary | ICD-10-CM

## 2020-11-04 DIAGNOSIS — Z0000 Encounter for general adult medical examination without abnormal findings: Secondary | ICD-10-CM

## 2020-11-04 DIAGNOSIS — M79605 Pain in left leg: Secondary | ICD-10-CM | POA: Diagnosis not present

## 2020-11-04 DIAGNOSIS — E7800 Pure hypercholesterolemia, unspecified: Secondary | ICD-10-CM | POA: Diagnosis not present

## 2020-11-04 DIAGNOSIS — C8590 Non-Hodgkin lymphoma, unspecified, unspecified site: Secondary | ICD-10-CM

## 2020-11-04 DIAGNOSIS — M79604 Pain in right leg: Secondary | ICD-10-CM | POA: Diagnosis not present

## 2020-11-04 DIAGNOSIS — D649 Anemia, unspecified: Secondary | ICD-10-CM | POA: Diagnosis not present

## 2020-11-04 DIAGNOSIS — L989 Disorder of the skin and subcutaneous tissue, unspecified: Secondary | ICD-10-CM

## 2020-11-04 NOTE — Progress Notes (Signed)
Patient ID: Maria Keller, female   DOB: 11-19-1950, 70 y.o.   MRN: [id number]   Subjective:    Patient ID: Maria Keller, female    DOB: 1950-05-08, 70 y.o.   MRN: [id number]  This visit occurred during the SARS-CoV-2 public health emergency.  Safety protocols were in place, including screening questions prior to the visit, additional usage of staff PPE, and extensive cleaning of exam room while observing appropriate contact time as indicated for disinfecting solutions.   Marland Kitchen   HPI With past history of chronic back pain, follicular lymphoma and hypercholesterolemia - she comes in today to follow up on these issues as well as for a complete physical exam.  Reports she is doing relatively well.  Followed at pain clinic for her chronic back pain.  Appears to be stable.  No chest pain.  Breathing stable.  No acid reflux reported.  No bowel change reported.  Tolerating her cholesterol medication relatively well.  Does take a break from taking prn.  Does report a "rough patch" - nose.  Appears to be handling stress relatively well.     Past Medical History:  Diagnosis Date   Anemia    iron deficiency and B12 deficiency   Arthritis    Bronchitis 08/2015   Chronic back pain    followed by Dr Primus Bravo   Depression    Diverticulosis    Esophageal stricture    requiring dilatation x 2   Fibromyalgia    Fibromyalgia    Fibromyalgia    Follicular lymphoma ([place])    Followed by Dr Leretha Pol, s/p chemo and XRT   Follicular lymphoma ([place])    GERD (gastroesophageal reflux disease)    GERD (gastroesophageal reflux disease)    Hemorrhoid    Hiatal hernia    History of kidney stones    Hypercholesterolemia    IBS (irritable bowel syndrome)    IBS (irritable bowel syndrome)    Nephrolithiasis    followed by Dr Bernardo Heater, s/p stents   PONV (postoperative nausea and vomiting)    Shingles outbreak 11/26/2011   Sleep apnea    Past Surgical History:  Procedure Laterality Date   APPENDECTOMY     BLADDER  SURGERY  1993   BREAST BIOPSY N/A    capsule endoscopy     CHOLECYSTECTOMY  1995   COLONOSCOPY WITH PROPOFOL N/A 06/19/2017   Procedure: COLONOSCOPY WITH PROPOFOL;  Surgeon: Manya Silvas, MD;  Location: [facility] ENDOSCOPY;  Service: Endoscopy;  Laterality: N/A;   ESOPHAGOGASTRODUODENOSCOPY (EGD) WITH PROPOFOL N/A 06/19/2017   Procedure: ESOPHAGOGASTRODUODENOSCOPY (EGD) WITH PROPOFOL;  Surgeon: Manya Silvas, MD;  Location: [facility] ENDOSCOPY;  Service: Endoscopy;  Laterality: N/A;   FRACTURE SURGERY     history of helicobacter pylori infection     JOINT REPLACEMENT     LEFT OOPHORECTOMY     LEG SURGERY Left    ORIF ANKLE FRACTURE  2012   OVARIAN CYST REMOVAL     right   RECTOCELE REPAIR N/A    REPLACEMENT TOTAL KNEE Right 03/19/2016   TONSILECTOMY/ADENOIDECTOMY WITH MYRINGOTOMY  1970   TONSILLECTOMY N/A    TUBAL LIGATION  1976   VAGINAL HYSTERECTOMY  1980's   secondary to bleeding   Family History  Problem Relation Age of Onset   Ulcers Mother    Ulcers Father    Breast cancer Other        great aunt and grandfather   Lung cancer Other        uncle  Hypertension Maternal Grandmother    Heart disease Maternal Grandmother        MI 77s   Diabetes Maternal Grandmother    Arthritis Maternal Grandmother    Diabetes Maternal Grandfather    Social History   Socioeconomic History   Marital status: Married    Spouse name: Not on file   Number of children: Not on file   Years of education: Not on file   Highest education level: Not on file  Occupational History   Not on file  Tobacco Use   Smoking status: Never   Smokeless tobacco: Never  Vaping Use   Vaping Use: Never used  Substance and Sexual Activity   Alcohol use: No    Alcohol/week: 0.0 standard drinks   Drug use: No   Sexual activity: Yes  Other Topics Concern   Not on file  Social History Narrative   Not on file   Social Determinants of Health   Financial Resource Strain: Low Risk    Difficulty of  Paying Living Expenses: Not hard at all  Food Insecurity: No Food Insecurity   Worried About Charity fundraiser in the Last Year: Never true   [place] in the Last Year: Never true  Transportation Needs: No Transportation Needs   Lack of Transportation (Medical): No   Lack of Transportation (Non-Medical): No  Physical Activity: Not on file  Stress: No Stress Concern Present   Feeling of Stress : Not at all  Social Connections: Socially Integrated   Frequency of Communication with Friends and Family: More than three times a week   Frequency of Social Gatherings with Friends and Family: More than three times a week   Attends Religious Services: More than 4 times per year   Active Member of [employer] or Organizations: Not on file   Attends Music therapist: More than 4 times per year   Marital Status: Married     Review of Systems  Constitutional:  Negative for appetite change and unexpected weight change.  HENT:  Negative for congestion, sinus pressure and sore throat.   Eyes:  Negative for pain and visual disturbance.  Respiratory:  Negative for cough, chest tightness and shortness of breath.   Cardiovascular:  Negative for chest pain, palpitations and leg swelling.  Gastrointestinal:  Negative for abdominal pain, diarrhea, nausea and vomiting.  Genitourinary:  Negative for difficulty urinating and dysuria.  Musculoskeletal:  Negative for joint swelling and myalgias.  Skin:  Negative for color change and rash.  Neurological:  Negative for dizziness, light-headedness and headaches.  Hematological:  Negative for adenopathy. Does not bruise/bleed easily.  Psychiatric/Behavioral:  Negative for decreased concentration and dysphoric mood.       Objective:     BP 118/72   Pulse 89   Temp 97.8 F (36.6 C)   Resp 16   Ht '5\' 7"'  (1.702 m)   Wt 168 lb (76.2 kg)   SpO2 98%   BMI 26.31 kg/m  Wt Readings from Last 3 Encounters:  11/04/20 168 lb (76.2 kg)  08/22/20  165 lb (74.8 kg)  07/05/20 165 lb (74.8 kg)    Physical Exam Vitals reviewed.  Constitutional:      General: She is not in acute distress.    Appearance: Normal appearance. She is well-developed.  HENT:     Head: Normocephalic and atraumatic.     Right Ear: External ear normal.     Left Ear: External ear normal.  Eyes:  General: No scleral icterus.       Right eye: No discharge.        Left eye: No discharge.     Conjunctiva/sclera: Conjunctivae normal.  Neck:     Thyroid: No thyromegaly.  Cardiovascular:     Rate and Rhythm: Normal rate and regular rhythm.  Pulmonary:     Effort: No tachypnea, accessory muscle usage or respiratory distress.     Breath sounds: Normal breath sounds. No decreased breath sounds or wheezing.  Chest:  Breasts:    Right: No inverted nipple, mass, nipple discharge or tenderness (no axillary adenopathy).     Left: No inverted nipple, mass, nipple discharge or tenderness (no axilarry adenopathy).  Abdominal:     General: Bowel sounds are normal.     Palpations: Abdomen is soft.     Tenderness: There is no abdominal tenderness.  Musculoskeletal:        General: No swelling or tenderness.     Cervical back: Neck supple.  Lymphadenopathy:     Cervical: No cervical adenopathy.  Skin:    Findings: No erythema or rash.  Neurological:     Mental Status: She is alert and oriented to person, place, and time.  Psychiatric:        Mood and Affect: Mood normal.        Behavior: Behavior normal.     Outpatient Encounter Medications as of 11/04/2020  Medication Sig   aspirin EC 81 MG tablet Take by mouth.   atorvastatin (LIPITOR) 10 MG tablet TAKE ONE TABLET EVERY DAY   azelastine (ASTELIN) 0.1 % nasal spray ONER SPRAY IN EACH NOSTRIL TWICE DAILY AS DIRECTED   calcium carbonate (TUMS - DOSED IN MG ELEMENTAL CALCIUM) 500 MG chewable tablet Chew 1 tablet by mouth as needed.   cetirizine (ZYRTEC) 10 MG tablet Take by mouth.    Diphenhyd-Hydrocort-Nystatin (FIRST-DUKES MOUTHWASH) SUSP Swish and spit 5 mLs tid prn   estradiol (ESTRACE) 0.5 MG tablet TAKE 1 TABLET BY MOUTH DAILY.   fentaNYL (DURAGESIC) 100 MCG/HR Apply 1 patch to skin every 2 days with fentanyl patch 25 g per hour if tolerated   fentaNYL (DURAGESIC) 25 MCG/HR patch Apply 1 patch to skin every 2 days with fentanyl patch 100 g per hour if tolerated   fluticasone (FLONASE) 50 MCG/ACT nasal spray Place 2 sprays into both nostrils daily.   furosemide (LASIX) 20 MG tablet TAKE ONE TABLET DAILY AS NEEDED   mupirocin ointment (BACTROBAN) 2 % Apply to affected area bid.   omeprazole (PRILOSEC) 20 MG capsule TAKE 1 CAPSULE BY MOUTH TWICE DAILY   oxyCODONE (ROXICODONE) 5 MG immediate release tablet Limit 1 tab by mouth per day or twice per day if tolerated for breakthrough pain while wearing fentanyl patches   senna-docusate (SENOKOT-S) 8.6-50 MG per tablet Take by mouth.   No facility-administered encounter medications on file as of 11/04/2020.     Lab Results  Component Value Date   WBC 7.6 11/10/2019   HGB 13.5 11/10/2019   HCT 40.3 11/10/2019   PLT 202.0 11/10/2019   GLUCOSE 100 (H) 11/02/2020   CHOL 186 11/02/2020   TRIG 120.0 11/02/2020   HDL 55.60 11/02/2020   LDLDIRECT 164.2 02/11/2013   LDLCALC 106 (H) 11/02/2020   ALT 23 11/02/2020   AST 30 11/02/2020   NA 141 11/02/2020   K 4.2 11/02/2020   CL 99 11/02/2020   CREATININE 0.81 11/02/2020   BUN 10 11/02/2020   CO2 36 (H) 11/02/2020  TSH 1.97 03/10/2020   HGBA1C 5.6 11/02/2020    MR Brain W Wo Contrast  Result Date: 05/14/2017 CLINICAL DATA:  Headaches for 2 months.  Non intractable headache. EXAM: MRI HEAD WITHOUT AND WITH CONTRAST TECHNIQUE: Multiplanar, multiecho pulse sequences of the brain and surrounding structures were obtained without and with intravenous contrast. CONTRAST:  19m MULTIHANCE GADOBENATE DIMEGLUMINE 529 MG/ML IV SOLN COMPARISON:  None. FINDINGS: Brain:  Periventricular and subcortical T2 hyperintensities bilaterally are mildly advanced for age. No acute infarct, hemorrhage, or mass lesion is present. Ventricles are of normal size. Insert pass fluid The internal auditory canals are within normal limits bilaterally. The brainstem and cerebellum are normal. Vascular: Flow is present in the major intracranial arteries. Skull and upper cervical spine: The skull base is within normal limits. Craniocervical junction is normal. The upper cervical spine is within normal limits. Sinuses/Orbits: Paranasal sinuses and mastoid air cells are clear. Bilateral lens replacements are present. Globes and orbits are within limits. Other: None. IMPRESSION: 1. Periventricular and subcortical T2 hyperintensities bilaterally are mildly advanced for age. The finding is nonspecific but can be seen in the setting of chronic microvascular ischemia, a demyelinating process such as multiple sclerosis, vasculitis, complicated migraine headaches, or as the sequelae of a prior infectious or inflammatory process. 2. No acute or focal abnormality to explain headaches otherwise. Electronically Signed   By: CSan MorelleM.D.   On: 05/14/2017 10:13       Assessment & Plan:   Problem List Items Addressed This Visit     Anemia    S/p colonoscopy and EGD.  Follow cbc.       Relevant Orders   Basic metabolic panel   CBC with Differential/Platelet   TSH   External nasal lesion    Bactroban.  Notify if persistent.       GERD (gastroesophageal reflux disease)    No upper symptoms reported.  On omeprazole.        Health care maintenance    Physical today 11/04/20.  S/p hysterectomy.  Mammogram 05/11/20 - Birads I.  Colonoscopy 06/2017 - internal hemorrhoids/melanosis - recommended f/u in 5 years.       Hypercholesterolemia    Continue lipitor. Unable to titrate dose.  Tolerating.   Low cholesterol diet and exercise.  Follow lipid panel and liver function tests.  Lab Results   Component Value Date   CHOL 186 11/02/2020   HDL 55.60 11/02/2020   LDLCALC 106 (H) 11/02/2020   LDLDIRECT 164.2 02/11/2013   TRIG 120.0 11/02/2020   CHOLHDL 3 11/02/2020       Relevant Orders   Hepatic function panel   Lipid panel   Hyperglycemia - Primary    Low carb diet and exercise.  Follow met b and a1c.       Relevant Orders   Hemoglobin A1c   IBS (irritable bowel syndrome)    Bowels appear to be stable.        Low back pain radiating to both legs    History of chronic back pain.  Followed by pain clinic.  Appears to be stable.       [place]kin's [place]    Followed by Dr DLeretha Pol  Stable.  Follows up yearly.         CEinar Pheasant MD

## 2020-11-04 NOTE — Assessment & Plan Note (Signed)
Physical today 11/04/20.  S/p hysterectomy.  Mammogram 05/11/20 - Birads I.  Colonoscopy 06/2017 - internal hemorrhoids/melanosis - recommended f/u in 5 years.

## 2020-11-13 ENCOUNTER — Encounter: Payer: Self-pay | Admitting: Internal Medicine

## 2020-11-13 DIAGNOSIS — L989 Disorder of the skin and subcutaneous tissue, unspecified: Secondary | ICD-10-CM | POA: Insufficient documentation

## 2020-11-13 NOTE — Assessment & Plan Note (Signed)
No upper symptoms reported.  On omeprazole.  

## 2020-11-13 NOTE — Assessment & Plan Note (Signed)
History of chronic back pain.  Followed by pain clinic.  Appears to be stable.  

## 2020-11-13 NOTE — Assessment & Plan Note (Signed)
Bactroban.  Notify if persistent.

## 2020-11-13 NOTE — Assessment & Plan Note (Signed)
Bowels appear to be stable.

## 2020-11-13 NOTE — Assessment & Plan Note (Addendum)
Continue lipitor. Unable to titrate dose.  Tolerating.   Low cholesterol diet and exercise.  Follow lipid panel and liver function tests.  Lab Results  Component Value Date   CHOL 186 11/02/2020   HDL 55.60 11/02/2020   LDLCALC 106 (H) 11/02/2020   LDLDIRECT 164.2 02/11/2013   TRIG 120.0 11/02/2020   CHOLHDL 3 11/02/2020

## 2020-11-13 NOTE — Assessment & Plan Note (Signed)
Low carb diet and exercise.  Follow met b and a1c.  

## 2020-11-13 NOTE — Assessment & Plan Note (Signed)
S/p colonoscopy and EGD.  Follow cbc.

## 2020-11-13 NOTE — Assessment & Plan Note (Signed)
Followed by Dr DeCastro.  Stable.  Follows up yearly.  

## 2020-11-15 ENCOUNTER — Telehealth: Payer: Self-pay | Admitting: Internal Medicine

## 2020-11-15 NOTE — Telephone Encounter (Signed)
Noted  

## 2020-11-15 NOTE — Telephone Encounter (Signed)
Patient is calling in to update Dr.Scott about the spot on her nose that she was concerned about at her last visit.She stated that the spot on her nose is better and she no longer needs a referral to a dermatologist.

## 2020-11-18 DIAGNOSIS — G894 Chronic pain syndrome: Secondary | ICD-10-CM | POA: Diagnosis not present

## 2020-11-18 DIAGNOSIS — M5030 Other cervical disc degeneration, unspecified cervical region: Secondary | ICD-10-CM | POA: Diagnosis not present

## 2020-11-21 DIAGNOSIS — G894 Chronic pain syndrome: Secondary | ICD-10-CM | POA: Diagnosis not present

## 2020-11-21 DIAGNOSIS — M5136 Other intervertebral disc degeneration, lumbar region: Secondary | ICD-10-CM | POA: Diagnosis not present

## 2020-11-21 DIAGNOSIS — M542 Cervicalgia: Secondary | ICD-10-CM | POA: Diagnosis not present

## 2020-11-21 DIAGNOSIS — M5030 Other cervical disc degeneration, unspecified cervical region: Secondary | ICD-10-CM | POA: Diagnosis not present

## 2020-11-23 DIAGNOSIS — Z23 Encounter for immunization: Secondary | ICD-10-CM | POA: Diagnosis not present

## 2020-12-01 DIAGNOSIS — Z96651 Presence of right artificial knee joint: Secondary | ICD-10-CM | POA: Diagnosis not present

## 2020-12-01 DIAGNOSIS — M7051 Other bursitis of knee, right knee: Secondary | ICD-10-CM | POA: Diagnosis not present

## 2020-12-17 DIAGNOSIS — M5136 Other intervertebral disc degeneration, lumbar region: Secondary | ICD-10-CM | POA: Diagnosis not present

## 2020-12-17 DIAGNOSIS — G894 Chronic pain syndrome: Secondary | ICD-10-CM | POA: Diagnosis not present

## 2020-12-17 DIAGNOSIS — M542 Cervicalgia: Secondary | ICD-10-CM | POA: Diagnosis not present

## 2020-12-17 DIAGNOSIS — M5030 Other cervical disc degeneration, unspecified cervical region: Secondary | ICD-10-CM | POA: Diagnosis not present

## 2020-12-19 ENCOUNTER — Telehealth: Payer: Self-pay | Admitting: Internal Medicine

## 2020-12-19 DIAGNOSIS — G894 Chronic pain syndrome: Secondary | ICD-10-CM | POA: Diagnosis not present

## 2020-12-19 DIAGNOSIS — M5030 Other cervical disc degeneration, unspecified cervical region: Secondary | ICD-10-CM | POA: Diagnosis not present

## 2020-12-19 DIAGNOSIS — M542 Cervicalgia: Secondary | ICD-10-CM | POA: Diagnosis not present

## 2020-12-19 DIAGNOSIS — M5136 Other intervertebral disc degeneration, lumbar region: Secondary | ICD-10-CM | POA: Diagnosis not present

## 2020-12-19 MED ORDER — FLUTICASONE PROPIONATE 50 MCG/ACT NA SUSP: 2.0000 | Freq: Every day | NASAL | 3 refills | Status: DC

## 2020-12-19 NOTE — Telephone Encounter (Signed)
Any other symptoms?  Change in breathing, etc.  If "one spot under knee" - need to evaluate to confirm what is needed.  Please see if she can come in 7:30 - Wednesday am. Work in for this.

## 2020-12-19 NOTE — Telephone Encounter (Signed)
Patient was seen by Dr Lars Masson last week at [facility]. He recommended go back on her furosemide (LASIX) 20 MG tablet because her rt leg has a spot under the knee area. Also patient would like a refill on her fluticasone (FLONASE) 50 MCG/ACT nasal spray.

## 2020-12-19 NOTE — Telephone Encounter (Signed)
Ok to refill lasix?

## 2020-12-19 NOTE — Telephone Encounter (Signed)
Left message for patient to return call back.  

## 2020-12-20 NOTE — Telephone Encounter (Signed)
Spoken to patient, she stated Dr Rosanna Randy instructed her to call her PCP to have refill of lasix since pcp was already monitoring her on lasix. Due to the spot on her right knee and hx of surgery he was unable to drain the fluid. He suggest taking gabapentin and lasix to get rid of the fluid. Provider did give patient gabapentin. Patient scheduled an appointment with PCP for tomorrow at 0730. Patient denies any sx except for the swelling. NO SOB, heart palpitations,blurry vision, chest px, arm px, jaw px, redness, warm to the touch, spreading erythema, and fever/chills.

## 2020-12-21 ENCOUNTER — Encounter: Payer: Self-pay | Admitting: Internal Medicine

## 2020-12-21 ENCOUNTER — Ambulatory Visit (INDEPENDENT_AMBULATORY_CARE_PROVIDER_SITE_OTHER): Payer: Medicare Other | Admitting: Internal Medicine

## 2020-12-21 ENCOUNTER — Other Ambulatory Visit: Payer: Self-pay

## 2020-12-21 ENCOUNTER — Telehealth: Payer: Self-pay

## 2020-12-21 DIAGNOSIS — M25469 Effusion, unspecified knee: Secondary | ICD-10-CM | POA: Insufficient documentation

## 2020-12-21 DIAGNOSIS — C8590 Non-Hodgkin lymphoma, unspecified, unspecified site: Secondary | ICD-10-CM

## 2020-12-21 LAB — BASIC METABOLIC PANEL
BUN: 14 mg/dL (ref 6–23)
CO2: 34 mEq/L — ABNORMAL HIGH (ref 19–32)
Calcium: 9.4 mg/dL (ref 8.4–10.5)
Chloride: 99 mEq/L (ref 96–112)
Creatinine, Ser: 0.91 mg/dL (ref 0.40–1.20)
GFR: 63.23 mL/min (ref >60.00)
Glucose, Bld: 104 mg/dL — ABNORMAL HIGH (ref 70–99)
Potassium: 4.1 mEq/L (ref 3.5–5.1)
Sodium: 140 mEq/L (ref 135–145)

## 2020-12-21 NOTE — Assessment & Plan Note (Signed)
S/p knee replacement.  Saw ortho as outlined.  Swelling appears to be more c/w soft tissue swelling.  On meloxicam.  Will check met b to confirm kidney function stable.  Has a history of taking lasix prn.  Will refill.  Can take lasix for next two days.  See if any improvement. Call with update.  Discussed possible need for f/u with ortho to evaluate.

## 2020-12-21 NOTE — Telephone Encounter (Signed)
I had told her to take one per day for the next two days and then call with update.

## 2020-12-21 NOTE — Progress Notes (Signed)
Patient ID: Maria Keller, female   DOB: [birth year], 70 y.o.   MRN: [id number]   Subjective:    Patient ID: Maria Keller, female    DOB: 03-29-1950, 70 y.o.   MRN: [id number]  This visit occurred during the SARS-CoV-2 public health emergency.  Safety protocols were in place, including screening questions prior to the visit, additional usage of staff PPE, and extensive cleaning of exam room while observing appropriate contact time as indicated for disinfecting solutions.   Patient here for  Chief Complaint  Patient presents with   Joint Swelling    Right knee   .   HPI Here as a workin appt.  Was seen by ortho 12/01/20 - right knee pain.  Is s/p total knee arthroplasty in 2018.  Was noted to have swelling around the knee.   Was placed on meloxicam.  She called in with request to restart lasix.  On questioning, she reports noticing some increased localized swelling - around right knee (below) - for the last month.  No redness.  No injury or trauma.  No increased pain.  Has noticed since starting the meloxicam, maybe a little decrease in the swelling.  Also, appears has shifted down some.  No limited rom.  Maybe a little aching at times.  No swelling extending up or down the leg.     Past Medical History:  Diagnosis Date   Anemia    iron deficiency and B12 deficiency   Arthritis    Bronchitis 08/2015   Chronic back pain    followed by Dr Primus Bravo   Depression    Diverticulosis    Esophageal stricture    requiring dilatation x 2   Fibromyalgia    Fibromyalgia    Fibromyalgia    Follicular lymphoma ([place])    Followed by Dr Leretha Pol, s/p chemo and XRT   Follicular lymphoma ([place])    GERD (gastroesophageal reflux disease)    GERD (gastroesophageal reflux disease)    Hemorrhoid    Hiatal hernia    History of kidney stones    Hypercholesterolemia    IBS (irritable bowel syndrome)    IBS (irritable bowel syndrome)    Nephrolithiasis    followed by Dr Bernardo Heater, s/p stents   PONV  (postoperative nausea and vomiting)    Shingles outbreak 11/26/2011   Sleep apnea    Past Surgical History:  Procedure Laterality Date   APPENDECTOMY     BLADDER SURGERY  1993   BREAST BIOPSY N/A    capsule endoscopy     CHOLECYSTECTOMY  1995   COLONOSCOPY WITH PROPOFOL N/A 06/19/2017   Procedure: COLONOSCOPY WITH PROPOFOL;  Surgeon: Manya Silvas, MD;  Location: [facility] ENDOSCOPY;  Service: Endoscopy;  Laterality: N/A;   ESOPHAGOGASTRODUODENOSCOPY (EGD) WITH PROPOFOL N/A 06/19/2017   Procedure: ESOPHAGOGASTRODUODENOSCOPY (EGD) WITH PROPOFOL;  Surgeon: Manya Silvas, MD;  Location: [facility] ENDOSCOPY;  Service: Endoscopy;  Laterality: N/A;   FRACTURE SURGERY     history of helicobacter pylori infection     JOINT REPLACEMENT     LEFT OOPHORECTOMY     LEG SURGERY Left    ORIF ANKLE FRACTURE  2012   OVARIAN CYST REMOVAL     right   RECTOCELE REPAIR N/A    REPLACEMENT TOTAL KNEE Right 03/19/2016   TONSILECTOMY/ADENOIDECTOMY WITH MYRINGOTOMY  1970   TONSILLECTOMY N/A    TUBAL LIGATION  1976   VAGINAL HYSTERECTOMY  1980's   secondary to bleeding   Family History  Problem Relation Age  of Onset   Ulcers Mother    Ulcers Father    Breast cancer Other        great aunt and grandfather   Lung cancer Other        uncle   Hypertension Maternal Grandmother    Heart disease Maternal Grandmother        MI 41s   Diabetes Maternal Grandmother    Arthritis Maternal Grandmother    Diabetes Maternal Grandfather    Social History   Socioeconomic History   Marital status: Married    Spouse name: Not on file   Number of children: Not on file   Years of education: Not on file   Highest education level: Not on file  Occupational History   Not on file  Tobacco Use   Smoking status: Never   Smokeless tobacco: Never  Vaping Use   Vaping Use: Never used  Substance and Sexual Activity   Alcohol use: No    Alcohol/week: 0.0 standard drinks   Drug use: No   Sexual activity: Yes  Other  Topics Concern   Not on file  Social History Narrative   Not on file   Social Determinants of Health   Financial Resource Strain: Low Risk    Difficulty of Paying Living Expenses: Not hard at all  Food Insecurity: No Food Insecurity   Worried About Charity fundraiser in the Last Year: Never true   [place] in the Last Year: Never true  Transportation Needs: No Transportation Needs   Lack of Transportation (Medical): No   Lack of Transportation (Non-Medical): No  Physical Activity: Not on file  Stress: No Stress Concern Present   Feeling of Stress : Not at all  Social Connections: Socially Integrated   Frequency of Communication with Friends and Family: More than three times a week   Frequency of Social Gatherings with Friends and Family: More than three times a week   Attends Religious Services: More than 4 times per year   Active Member of [employer] or Organizations: Not on file   Attends Archivist Meetings: More than 4 times per year   Marital Status: Married     Review of Systems  Constitutional:  Negative for appetite change and fever.  HENT:  Negative for sinus pressure.   Respiratory:  Negative for cough, chest tightness and shortness of breath.   Cardiovascular:  Negative for chest pain and palpitations.  Gastrointestinal:  Negative for abdominal pain, diarrhea, nausea and vomiting.  Genitourinary:  Negative for difficulty urinating and dysuria.  Musculoskeletal:  Negative for myalgias.       Swelling localized below knee as outlined.    Skin:  Negative for color change and rash.  Neurological:  Negative for dizziness and headaches.  Psychiatric/Behavioral:  Negative for agitation and dysphoric mood.       Objective:     BP 122/70   Pulse 86   Temp (!) 97.4 F (36.3 C)   Resp 16   Wt 171 lb 3.2 oz (77.7 kg)   SpO2 98%   BMI 26.81 kg/m  Wt Readings from Last 3 Encounters:  12/21/20 171 lb 3.2 oz (77.7 kg)  11/04/20 168 lb (76.2 kg)   08/22/20 165 lb (74.8 kg)    Physical Exam Vitals reviewed.  Constitutional:      General: She is not in acute distress.    Appearance: Normal appearance.  HENT:     Head: Normocephalic and atraumatic.  Right Ear: External ear normal.     Left Ear: External ear normal.  Eyes:     General: No scleral icterus.       Right eye: No discharge.        Left eye: No discharge.     Conjunctiva/sclera: Conjunctivae normal.  Neck:     Thyroid: No thyromegaly.  Cardiovascular:     Rate and Rhythm: Normal rate and regular rhythm.  Pulmonary:     Effort: No respiratory distress.     Breath sounds: Normal breath sounds. No wheezing.  Abdominal:     General: Bowel sounds are normal.     Palpations: Abdomen is soft.     Tenderness: There is no abdominal tenderness.  Musculoskeletal:        General: No tenderness.     Cervical back: Neck supple. No tenderness.     Comments: Good rom - right knee.  Appears to have minimal soft tissue swelling - just below right knee. No pain.  No increased erythema or warmth.    Lymphadenopathy:     Cervical: No cervical adenopathy.  Skin:    Findings: No erythema or rash.  Neurological:     Mental Status: She is alert.  Psychiatric:        Mood and Affect: Mood normal.        Behavior: Behavior normal.     Outpatient Encounter Medications as of 12/21/2020  Medication Sig   aspirin EC 81 MG tablet Take by mouth.   atorvastatin (LIPITOR) 10 MG tablet TAKE ONE TABLET EVERY DAY   azelastine (ASTELIN) 0.1 % nasal spray ONER SPRAY IN EACH NOSTRIL TWICE DAILY AS DIRECTED   calcium carbonate (TUMS - DOSED IN MG ELEMENTAL CALCIUM) 500 MG chewable tablet Chew 1 tablet by mouth as needed.   cetirizine (ZYRTEC) 10 MG tablet Take by mouth.   Diphenhyd-Hydrocort-Nystatin (FIRST-DUKES MOUTHWASH) SUSP Swish and spit 5 mLs tid prn   estradiol (ESTRACE) 0.5 MG tablet TAKE 1 TABLET BY MOUTH DAILY.   fentaNYL (DURAGESIC) 100 MCG/HR Apply 1 patch to skin every 2  days with fentanyl patch 25 g per hour if tolerated   fentaNYL (DURAGESIC) 25 MCG/HR patch Apply 1 patch to skin every 2 days with fentanyl patch 100 g per hour if tolerated   fluticasone (FLONASE) 50 MCG/ACT nasal spray Place 2 sprays into both nostrils daily.   furosemide (LASIX) 20 MG tablet TAKE ONE TABLET DAILY AS NEEDED   meloxicam (MOBIC) 15 MG tablet Take 15 mg by mouth daily.   mupirocin ointment (BACTROBAN) 2 % Apply to affected area bid.   omeprazole (PRILOSEC) 20 MG capsule TAKE 1 CAPSULE BY MOUTH TWICE DAILY   oxyCODONE (ROXICODONE) 5 MG immediate release tablet Limit 1 tab by mouth per day or twice per day if tolerated for breakthrough pain while wearing fentanyl patches   senna-docusate (SENOKOT-S) 8.6-50 MG per tablet Take by mouth.   No facility-administered encounter medications on file as of 12/21/2020.     Lab Results  Component Value Date   WBC 7.6 11/10/2019   HGB 13.5 11/10/2019   HCT 40.3 11/10/2019   PLT 202.0 11/10/2019   GLUCOSE 100 (H) 11/02/2020   CHOL 186 11/02/2020   TRIG 120.0 11/02/2020   HDL 55.60 11/02/2020   LDLDIRECT 164.2 02/11/2013   LDLCALC 106 (H) 11/02/2020   ALT 23 11/02/2020   AST 30 11/02/2020   NA 141 11/02/2020   K 4.2 11/02/2020   CL 99 11/02/2020  CREATININE 0.81 11/02/2020   BUN 10 11/02/2020   CO2 36 (H) 11/02/2020   TSH 1.97 03/10/2020   HGBA1C 5.6 11/02/2020    MR Brain W Wo Contrast  Result Date: 05/14/2017 CLINICAL DATA:  Headaches for 2 months.  Non intractable headache. EXAM: MRI HEAD WITHOUT AND WITH CONTRAST TECHNIQUE: Multiplanar, multiecho pulse sequences of the brain and surrounding structures were obtained without and with intravenous contrast. CONTRAST:  79m MULTIHANCE GADOBENATE DIMEGLUMINE 529 MG/ML IV SOLN COMPARISON:  None. FINDINGS: Brain: Periventricular and subcortical T2 hyperintensities bilaterally are mildly advanced for age. No acute infarct, hemorrhage, or mass lesion is present. Ventricles are of  normal size. Insert pass fluid The internal auditory canals are within normal limits bilaterally. The brainstem and cerebellum are normal. Vascular: Flow is present in the major intracranial arteries. Skull and upper cervical spine: The skull base is within normal limits. Craniocervical junction is normal. The upper cervical spine is within normal limits. Sinuses/Orbits: Paranasal sinuses and mastoid air cells are clear. Bilateral lens replacements are present. Globes and orbits are within limits. Other: None. IMPRESSION: 1. Periventricular and subcortical T2 hyperintensities bilaterally are mildly advanced for age. The finding is nonspecific but can be seen in the setting of chronic microvascular ischemia, a demyelinating process such as multiple sclerosis, vasculitis, complicated migraine headaches, or as the sequelae of a prior infectious or inflammatory process. 2. No acute or focal abnormality to explain headaches otherwise. Electronically Signed   By: CSan MorelleM.D.   On: 05/14/2017 10:13       Assessment & Plan:   Problem List Items Addressed This Visit     Knee swelling    S/p knee replacement.  Saw ortho as outlined.  Swelling appears to be more c/w soft tissue swelling.  On meloxicam.  Will check met b to confirm kidney function stable.  Has a history of taking lasix prn.  Will refill.  Can take lasix for next two days.  See if any improvement. Call with update.  Discussed possible need for f/u with ortho to evaluate.        Relevant Orders   Basic metabolic panel   Non-Hodgkin's lymphoma ([place]    Followed by Dr DLeretha Pol  Stable.  Follows up yearly.       Relevant Medications   meloxicam (MOBIC) 15 MG tablet     CEinar Pheasant MD

## 2020-12-21 NOTE — Assessment & Plan Note (Signed)
Followed by Dr DeCastro.  Stable.  Follows up yearly.  

## 2020-12-21 NOTE — Telephone Encounter (Signed)
Spoke with patient. She found her lasix at home. Advised you are out of office this PM and I would call her back in the morning with directions of how she needs to take

## 2020-12-22 NOTE — Telephone Encounter (Signed)
Patient aware.

## 2020-12-23 NOTE — Telephone Encounter (Signed)
Want to confirm with her that the swelling is the localized soft tissue swelling just below her knee that she had on exam when here.  Confirm no "leg swelling".  I feel the changes I saw on exam are more soft tissue and I recommend f/u with ortho.  She has seen Reche Dixon for this.

## 2020-12-23 NOTE — Telephone Encounter (Signed)
Confirmed no leg swelling. Localized swelling to the soft tissue. She is going to call ortho and get back in with them. Will let me know if she needs anything.

## 2020-12-23 NOTE — Telephone Encounter (Signed)
Patient wanted to let Dr Nicki Reaper know that she can not tell a difference in taking lasix, leg still swollen.

## 2020-12-23 NOTE — Telephone Encounter (Signed)
FYI- we told her to update [place] today when she was here on Wednesday. Lasix did not help her knee

## 2021-01-04 ENCOUNTER — Other Ambulatory Visit: Payer: Self-pay | Admitting: Surgery

## 2021-01-04 DIAGNOSIS — G8929 Other chronic pain: Secondary | ICD-10-CM | POA: Diagnosis not present

## 2021-01-04 DIAGNOSIS — D2121 Benign neoplasm of connective and other soft tissue of right lower limb, including hip: Secondary | ICD-10-CM | POA: Diagnosis not present

## 2021-01-04 DIAGNOSIS — M25561 Pain in right knee: Secondary | ICD-10-CM | POA: Diagnosis not present

## 2021-01-04 DIAGNOSIS — Z96651 Presence of right artificial knee joint: Secondary | ICD-10-CM | POA: Diagnosis not present

## 2021-01-04 DIAGNOSIS — M1711 Unilateral primary osteoarthritis, right knee: Secondary | ICD-10-CM | POA: Diagnosis not present

## 2021-01-06 ENCOUNTER — Ambulatory Visit
Admission: RE | Admit: 2021-01-06 | Discharge: 2021-01-06 | Disposition: A | Discharge status: Home / Self Care | Payer: Medicare Other | Source: Ambulatory Visit | Attending: Surgery | Admitting: Surgery

## 2021-01-06 ENCOUNTER — Other Ambulatory Visit: Payer: Self-pay

## 2021-01-06 DIAGNOSIS — D2121 Benign neoplasm of connective and other soft tissue of right lower limb, including hip: Secondary | ICD-10-CM | POA: Insufficient documentation

## 2021-01-06 DIAGNOSIS — M79661 Pain in right lower leg: Secondary | ICD-10-CM | POA: Diagnosis not present

## 2021-01-06 DIAGNOSIS — Z96651 Presence of right artificial knee joint: Secondary | ICD-10-CM | POA: Diagnosis not present

## 2021-01-06 DIAGNOSIS — Z471 Aftercare following joint replacement surgery: Secondary | ICD-10-CM | POA: Diagnosis not present

## 2021-01-06 MED ORDER — GADOBUTROL 1 MMOL/ML IV SOLN
7.5000 mL | Freq: Once | INTRAVENOUS | Status: AC | PRN
  Administered 2021-01-06: 7.5 mL via INTRAVENOUS

## 2021-01-13 DIAGNOSIS — M7050 Other bursitis of knee, unspecified knee: Secondary | ICD-10-CM | POA: Diagnosis not present

## 2021-01-16 DIAGNOSIS — G894 Chronic pain syndrome: Secondary | ICD-10-CM | POA: Diagnosis not present

## 2021-01-16 DIAGNOSIS — M542 Cervicalgia: Secondary | ICD-10-CM | POA: Diagnosis not present

## 2021-01-16 DIAGNOSIS — M5136 Other intervertebral disc degeneration, lumbar region: Secondary | ICD-10-CM | POA: Diagnosis not present

## 2021-01-23 ENCOUNTER — Other Ambulatory Visit: Payer: Self-pay | Admitting: Surgery

## 2021-01-23 DIAGNOSIS — Z96651 Presence of right artificial knee joint: Secondary | ICD-10-CM

## 2021-01-23 DIAGNOSIS — M25561 Pain in right knee: Secondary | ICD-10-CM

## 2021-01-25 ENCOUNTER — Other Ambulatory Visit: Payer: Self-pay | Admitting: Internal Medicine

## 2021-02-13 ENCOUNTER — Other Ambulatory Visit: Payer: Self-pay

## 2021-02-13 ENCOUNTER — Ambulatory Visit
Admission: RE | Admit: 2021-02-13 | Discharge: 2021-02-13 | Disposition: A | Discharge status: Home / Self Care | Payer: Medicare Other | Source: Ambulatory Visit | Attending: Surgery | Admitting: Surgery

## 2021-02-13 DIAGNOSIS — M25561 Pain in right knee: Secondary | ICD-10-CM | POA: Diagnosis not present

## 2021-02-13 DIAGNOSIS — M2559 Pain in other specified joint: Secondary | ICD-10-CM | POA: Diagnosis not present

## 2021-02-13 DIAGNOSIS — G4700 Insomnia, unspecified: Secondary | ICD-10-CM | POA: Diagnosis not present

## 2021-02-13 DIAGNOSIS — F419 Anxiety disorder, unspecified: Secondary | ICD-10-CM | POA: Diagnosis not present

## 2021-02-13 DIAGNOSIS — M9951 Intervertebral disc stenosis of neural canal of cervical region: Secondary | ICD-10-CM | POA: Diagnosis not present

## 2021-02-13 DIAGNOSIS — M792 Neuralgia and neuritis, unspecified: Secondary | ICD-10-CM | POA: Diagnosis not present

## 2021-02-13 DIAGNOSIS — G8929 Other chronic pain: Secondary | ICD-10-CM | POA: Diagnosis not present

## 2021-02-13 DIAGNOSIS — Z96651 Presence of right artificial knee joint: Secondary | ICD-10-CM | POA: Insufficient documentation

## 2021-02-13 DIAGNOSIS — M48062 Spinal stenosis, lumbar region with neurogenic claudication: Secondary | ICD-10-CM | POA: Diagnosis not present

## 2021-02-13 DIAGNOSIS — M47897 Other spondylosis, lumbosacral region: Secondary | ICD-10-CM | POA: Diagnosis not present

## 2021-02-21 ENCOUNTER — Telehealth: Payer: Self-pay | Admitting: Internal Medicine

## 2021-02-21 NOTE — Telephone Encounter (Signed)
Pt called in wanting to know if she needs to have blood work done before her upcoming appt.

## 2021-02-22 NOTE — Telephone Encounter (Signed)
Are the future labs in the chart supposed to be drawn before her appt on 03/07/2021?

## 2021-02-22 NOTE — Telephone Encounter (Signed)
Yes.  Please schedule 1-2 days before next appt.

## 2021-02-23 NOTE — Telephone Encounter (Signed)
Appt scheduled

## 2021-02-28 ENCOUNTER — Other Ambulatory Visit (INDEPENDENT_AMBULATORY_CARE_PROVIDER_SITE_OTHER): Payer: Medicare Other

## 2021-02-28 ENCOUNTER — Other Ambulatory Visit: Payer: Self-pay

## 2021-02-28 DIAGNOSIS — E7800 Pure hypercholesterolemia, unspecified: Secondary | ICD-10-CM | POA: Diagnosis not present

## 2021-02-28 DIAGNOSIS — D649 Anemia, unspecified: Secondary | ICD-10-CM

## 2021-02-28 DIAGNOSIS — R739 Hyperglycemia, unspecified: Secondary | ICD-10-CM | POA: Diagnosis not present

## 2021-02-28 LAB — HEPATIC FUNCTION PANEL
ALT: 13 U/L (ref 0–35)
AST: 21 U/L (ref 0–37)
Albumin: 4.2 g/dL (ref 3.5–5.2)
Alkaline Phosphatase: 61 U/L (ref 39–117)
Bilirubin, Direct: 0.1 mg/dL (ref 0.0–0.3)
Total Bilirubin: 0.6 mg/dL (ref 0.2–1.2)
Total Protein: 6.4 g/dL (ref 6.0–8.3)

## 2021-02-28 LAB — LIPID PANEL
Cholesterol: 176 mg/dL (ref 0–200)
HDL: 52.1 mg/dL (ref >39.00)
LDL Cholesterol: 103 mg/dL — ABNORMAL HIGH (ref 0–99)
NonHDL: 123.65
Total CHOL/HDL Ratio: 3
Triglycerides: 103 mg/dL (ref 0.0–149.0)
VLDL: 20.6 mg/dL (ref 0.0–40.0)

## 2021-02-28 LAB — BASIC METABOLIC PANEL
BUN: 12 mg/dL (ref 6–23)
CO2: 32 mEq/L (ref 19–32)
Calcium: 9 mg/dL (ref 8.4–10.5)
Chloride: 102 mEq/L (ref 96–112)
Creatinine, Ser: 0.75 mg/dL (ref 0.40–1.20)
GFR: 79.63 mL/min (ref >60.00)
Glucose, Bld: 114 mg/dL — ABNORMAL HIGH (ref 70–99)
Potassium: 4.2 mEq/L (ref 3.5–5.1)
Sodium: 141 mEq/L (ref 135–145)

## 2021-02-28 LAB — CBC WITH DIFFERENTIAL/PLATELET
Basophils Absolute: 0.1 10*3/uL (ref 0.0–0.1)
Basophils Relative: 0.8 % (ref 0.0–3.0)
Eosinophils Absolute: 0.1 10*3/uL (ref 0.0–0.7)
Eosinophils Relative: 1.8 % (ref 0.0–5.0)
HCT: 37.8 % (ref 36.0–46.0)
Hemoglobin: 12.3 g/dL (ref 12.0–15.0)
Lymphocytes Relative: 36.3 % (ref 12.0–46.0)
Lymphs Abs: 2.3 10*3/uL (ref 0.7–4.0)
MCHC: 32.6 g/dL (ref 30.0–36.0)
MCV: 85.1 fl (ref 78.0–100.0)
Monocytes Absolute: 0.5 10*3/uL (ref 0.1–1.0)
Monocytes Relative: 7.6 % (ref 3.0–12.0)
Neutro Abs: 3.4 10*3/uL (ref 1.4–7.7)
Neutrophils Relative %: 53.5 % (ref 43.0–77.0)
Platelets: 181 10*3/uL (ref 150.0–400.0)
RBC: 4.44 Mil/uL (ref 3.87–5.11)
RDW: 13.9 % (ref 11.5–15.5)
WBC: 6.3 10*3/uL (ref 4.0–10.5)

## 2021-02-28 LAB — HEMOGLOBIN A1C: Hgb A1c MFr Bld: 5.4 % (ref 4.6–6.5)

## 2021-02-28 LAB — TSH: TSH: 1.61 u[IU]/mL (ref 0.35–5.50)

## 2021-03-03 DIAGNOSIS — M1711 Unilateral primary osteoarthritis, right knee: Secondary | ICD-10-CM | POA: Diagnosis not present

## 2021-03-03 DIAGNOSIS — M25561 Pain in right knee: Secondary | ICD-10-CM | POA: Diagnosis not present

## 2021-03-03 DIAGNOSIS — Z96651 Presence of right artificial knee joint: Secondary | ICD-10-CM | POA: Diagnosis not present

## 2021-03-03 DIAGNOSIS — G8929 Other chronic pain: Secondary | ICD-10-CM | POA: Diagnosis not present

## 2021-03-07 ENCOUNTER — Ambulatory Visit (INDEPENDENT_AMBULATORY_CARE_PROVIDER_SITE_OTHER): Payer: Medicare Other | Admitting: Internal Medicine

## 2021-03-07 ENCOUNTER — Other Ambulatory Visit: Payer: Self-pay

## 2021-03-07 ENCOUNTER — Encounter: Payer: Self-pay | Admitting: Internal Medicine

## 2021-03-07 VITALS — BP 126/70 | HR 90 | Temp 98.0°F | Resp 16 | Ht 67.0 in | Wt 169.0 lb

## 2021-03-07 DIAGNOSIS — H9390 Unspecified disorder of ear, unspecified ear: Secondary | ICD-10-CM | POA: Diagnosis not present

## 2021-03-07 DIAGNOSIS — K219 Gastro-esophageal reflux disease without esophagitis: Secondary | ICD-10-CM | POA: Diagnosis not present

## 2021-03-07 DIAGNOSIS — D649 Anemia, unspecified: Secondary | ICD-10-CM | POA: Diagnosis not present

## 2021-03-07 DIAGNOSIS — C8590 Non-Hodgkin lymphoma, unspecified, unspecified site: Secondary | ICD-10-CM

## 2021-03-07 DIAGNOSIS — R739 Hyperglycemia, unspecified: Secondary | ICD-10-CM

## 2021-03-07 DIAGNOSIS — M25469 Effusion, unspecified knee: Secondary | ICD-10-CM

## 2021-03-07 DIAGNOSIS — E7800 Pure hypercholesterolemia, unspecified: Secondary | ICD-10-CM | POA: Diagnosis not present

## 2021-03-07 DIAGNOSIS — M4696 Unspecified inflammatory spondylopathy, lumbar region: Secondary | ICD-10-CM | POA: Diagnosis not present

## 2021-03-07 MED ORDER — FUROSEMIDE 20 MG PO TABS: ORAL_TABLET | ORAL | 0 refills | Status: DC

## 2021-03-07 MED ORDER — AZELASTINE HCL 0.1 % NA SOLN: NASAL | 1 refills | Status: DC

## 2021-03-07 NOTE — Progress Notes (Signed)
Patient ID: Maria Keller, female   DOB: August 22, 1950, 71 y.o.   MRN: [id number]   Subjective:    Patient ID: Maria Keller, female    DOB: 1950/11/02, 71 y.o.   MRN: [id number]  This visit occurred during the SARS-CoV-2 public health emergency.  Safety protocols were in place, including screening questions prior to the visit, additional usage of staff PPE, and extensive cleaning of exam room while observing appropriate contact time as indicated for disinfecting solutions.   Patient here for a scheduled follow up.   Chief Complaint  Patient presents with   Gastroesophageal Reflux   Hyperlipidemia   .   Gastroesophageal Reflux She reports no abdominal pain, no chest pain, no coughing or no nausea.  Hyperlipidemia Pertinent negatives include no chest pain, myalgias or shortness of breath.  Having persistent problems with her right knee swelling and discomfort.  Seeing Dr Roland Rack.  Has a knee brace now.  MRI and CT - knee - unrevealing.  S/p injection.  Planning brace for a couple of months and then f/u - considering bone scan if persistent problems.  States otherwise doing well.  No chest pain or sob reported.  No abdominal pain or bowel change reported.  Minimal itching - right ear.  Just started a couple of days ago.  Using cream.  Will notify me if persistent.  Drainage - astelin helps.     Past Medical History:  Diagnosis Date   Anemia    iron deficiency and B12 deficiency   Arthritis    Bronchitis 08/2015   Chronic back pain    followed by Dr Primus Bravo   Depression    Diverticulosis    Esophageal stricture    requiring dilatation x 2   Fibromyalgia    Fibromyalgia    Fibromyalgia    Follicular lymphoma ([place])    Followed by Dr Leretha Pol, s/p chemo and XRT   Follicular lymphoma ([place])    GERD (gastroesophageal reflux disease)    GERD (gastroesophageal reflux disease)    Hemorrhoid    Hiatal hernia    History of kidney stones    Hypercholesterolemia    IBS (irritable bowel syndrome)     IBS (irritable bowel syndrome)    Nephrolithiasis    followed by Dr Bernardo Heater, s/p stents   PONV (postoperative nausea and vomiting)    Shingles outbreak 11/26/2011   Sleep apnea    Past Surgical History:  Procedure Laterality Date   APPENDECTOMY     BLADDER SURGERY  1993   BREAST BIOPSY N/A    capsule endoscopy     CHOLECYSTECTOMY  1995   COLONOSCOPY WITH PROPOFOL N/A 06/19/2017   Procedure: COLONOSCOPY WITH PROPOFOL;  Surgeon: Manya Silvas, MD;  Location: [facility] ENDOSCOPY;  Service: Endoscopy;  Laterality: N/A;   ESOPHAGOGASTRODUODENOSCOPY (EGD) WITH PROPOFOL N/A 06/19/2017   Procedure: ESOPHAGOGASTRODUODENOSCOPY (EGD) WITH PROPOFOL;  Surgeon: Manya Silvas, MD;  Location: [facility] ENDOSCOPY;  Service: Endoscopy;  Laterality: N/A;   FRACTURE SURGERY     history of helicobacter pylori infection     JOINT REPLACEMENT     LEFT OOPHORECTOMY     LEG SURGERY Left    ORIF ANKLE FRACTURE  2012   OVARIAN CYST REMOVAL     right   RECTOCELE REPAIR N/A    REPLACEMENT TOTAL KNEE Right 03/19/2016   TONSILECTOMY/ADENOIDECTOMY WITH MYRINGOTOMY  1970   TONSILLECTOMY N/A    TUBAL LIGATION  1976   VAGINAL HYSTERECTOMY  1980's   secondary to bleeding  Family History  Problem Relation Age of Onset   Ulcers Mother    Ulcers Father    Breast cancer Other        great aunt and grandfather   Lung cancer Other        uncle   Hypertension Maternal Grandmother    Heart disease Maternal Grandmother        MI 73s   Diabetes Maternal Grandmother    Arthritis Maternal Grandmother    Diabetes Maternal Grandfather    Social History   Socioeconomic History   Marital status: Married    Spouse name: Not on file   Number of children: Not on file   Years of education: Not on file   Highest education level: Not on file  Occupational History   Not on file  Tobacco Use   Smoking status: Never   Smokeless tobacco: Never  Vaping Use   Vaping Use: Never used  Substance and Sexual Activity    Alcohol use: No    Alcohol/week: 0.0 standard drinks   Drug use: No   Sexual activity: Yes  Other Topics Concern   Not on file  Social History Narrative   Not on file   Social Determinants of Health   Financial Resource Strain: Low Risk    Difficulty of Paying Living Expenses: Not hard at all  Food Insecurity: No Food Insecurity   Worried About Charity fundraiser in the Last Year: Never true   [place] in the Last Year: Never true  Transportation Needs: No Transportation Needs   Lack of Transportation (Medical): No   Lack of Transportation (Non-Medical): No  Physical Activity: Not on file  Stress: No Stress Concern Present   Feeling of Stress : Not at all  Social Connections: Socially Integrated   Frequency of Communication with Friends and Family: More than three times a week   Frequency of Social Gatherings with Friends and Family: More than three times a week   Attends Religious Services: More than 4 times per year   Active Member of [employer] or Organizations: Not on file   Attends Music therapist: More than 4 times per year   Marital Status: Married     Review of Systems  Constitutional:  Negative for appetite change and unexpected weight change.  HENT:  Positive for postnasal drip. Negative for congestion and sinus pressure.   Respiratory:  Negative for cough, chest tightness and shortness of breath.   Cardiovascular:  Negative for chest pain, palpitations and leg swelling.  Gastrointestinal:  Negative for abdominal pain, diarrhea, nausea and vomiting.  Genitourinary:  Negative for difficulty urinating and dysuria.  Musculoskeletal:  Negative for joint swelling and myalgias.  Skin:  Negative for color change and rash.  Neurological:  Negative for dizziness, light-headedness and headaches.  Psychiatric/Behavioral:  Negative for agitation and dysphoric mood.       Objective:     BP 126/70    Pulse 90    Temp 98 F (36.7 C)    Resp 16    Ht '5\' 7"'   (1.702 m)    Wt 169 lb (76.7 kg)    SpO2 97%    BMI 26.47 kg/m  Wt Readings from Last 3 Encounters:  03/07/21 169 lb (76.7 kg)  12/21/20 171 lb 3.2 oz (77.7 kg)  11/04/20 168 lb (76.2 kg)    Physical Exam Vitals reviewed.  Constitutional:      General: She is not in acute distress.  Appearance: Normal appearance.  HENT:     Head: Normocephalic and atraumatic.     Right Ear: External ear normal.     Left Ear: External ear normal.  Eyes:     General: No scleral icterus.       Right eye: No discharge.        Left eye: No discharge.     Conjunctiva/sclera: Conjunctivae normal.  Neck:     Thyroid: No thyromegaly.  Cardiovascular:     Rate and Rhythm: Normal rate and regular rhythm.  Pulmonary:     Effort: No respiratory distress.     Breath sounds: Normal breath sounds. No wheezing.  Abdominal:     General: Bowel sounds are normal.     Palpations: Abdomen is soft.     Tenderness: There is no abdominal tenderness.  Musculoskeletal:        General: No swelling or tenderness.     Cervical back: Neck supple. No tenderness.  Lymphadenopathy:     Cervical: No cervical adenopathy.  Skin:    Findings: No erythema or rash.  Neurological:     Mental Status: She is alert.  Psychiatric:        Mood and Affect: Mood normal.        Behavior: Behavior normal.     Outpatient Encounter Medications as of 03/07/2021  Medication Sig   aspirin EC 81 MG tablet Take by mouth.   atorvastatin (LIPITOR) 10 MG tablet TAKE ONE TABLET EVERY DAY   azelastine (ASTELIN) 0.1 % nasal spray ONE SPRAY IN EACH NOSTRIL TWICE DAILY AS DIRECTED   calcium carbonate (TUMS - DOSED IN MG ELEMENTAL CALCIUM) 500 MG chewable tablet Chew 1 tablet by mouth as needed.   cetirizine (ZYRTEC) 10 MG tablet Take by mouth.   Diphenhyd-Hydrocort-Nystatin (FIRST-DUKES MOUTHWASH) SUSP Swish and spit 5 mLs tid prn   estradiol (ESTRACE) 0.5 MG tablet TAKE 1 TABLET BY MOUTH DAILY.   fentaNYL (DURAGESIC) 100 MCG/HR Apply 1  patch to skin every 2 days with fentanyl patch 25 g per hour if tolerated   fentaNYL (DURAGESIC) 25 MCG/HR patch Apply 1 patch to skin every 2 days with fentanyl patch 100 g per hour if tolerated   fluticasone (FLONASE) 50 MCG/ACT nasal spray Place 2 sprays into both nostrils daily.   furosemide (LASIX) 20 MG tablet TAKE ONE TABLET DAILY AS NEEDED   meloxicam (MOBIC) 15 MG tablet Take 15 mg by mouth daily.   mupirocin ointment (BACTROBAN) 2 % Apply to affected area bid.   omeprazole (PRILOSEC) 20 MG capsule TAKE 1 CAPSULE BY MOUTH TWICE DAILY   oxyCODONE (ROXICODONE) 5 MG immediate release tablet Limit 1 tab by mouth per day or twice per day if tolerated for breakthrough pain while wearing fentanyl patches   senna-docusate (SENOKOT-S) 8.6-50 MG per tablet Take by mouth.   [DISCONTINUED] azelastine (ASTELIN) 0.1 % nasal spray ONER SPRAY IN EACH NOSTRIL TWICE DAILY AS DIRECTED   [DISCONTINUED] furosemide (LASIX) 20 MG tablet TAKE ONE TABLET DAILY AS NEEDED   No facility-administered encounter medications on file as of 03/07/2021.     Lab Results  Component Value Date   WBC 6.3 02/28/2021   HGB 12.3 02/28/2021   HCT 37.8 02/28/2021   PLT 181.0 02/28/2021   GLUCOSE 114 (H) 02/28/2021   CHOL 176 02/28/2021   TRIG 103.0 02/28/2021   HDL 52.10 02/28/2021   LDLDIRECT 164.2 02/11/2013   LDLCALC 103 (H) 02/28/2021   ALT 13 02/28/2021   AST 21  02/28/2021   NA 141 02/28/2021   K 4.2 02/28/2021   CL 102 02/28/2021   CREATININE 0.75 02/28/2021   BUN 12 02/28/2021   CO2 32 02/28/2021   TSH 1.61 02/28/2021   HGBA1C 5.4 02/28/2021    CT KNEE RIGHT WO CONTRAST  Result Date: 02/14/2021 CLINICAL DATA:  Right knee pain and swelling below the right knee for 3 months. 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 History of follicular lymphoma in 4356. EXAM: CT OF THE right KNEE WITHOUT CONTRAST TECHNIQUE: Multidetector CT imaging of the right knee was performed according to the standard  protocol. Multiplanar CT image reconstructions were also generated. COMPARISON:  MRI right calf 01/06/2021, MRI right knee 11/30/2015 FINDINGS: Bones/Joint/Cartilage Postsurgical changes are again seen of total right knee arthroplasty. Additional transverse screw within the distal femoral metaphysis. This hardware produces streak artifacts that limits evaluation of the distal femur greater than the proximal tibia. Within this limitation, no definite perihardware lucency is seen to indicate CT evidence of hardware loosening. Tiny joint effusion. No acute fracture is seen. No dislocation. Ligaments Suboptimally assessed by CT. Muscles and Tendons There is again focal fat seen within the far medial aspect of the mid to distal soleus musculature, as on 01/06/2021 MRI. Soft tissues Otherwise unremarkable. IMPRESSION: Status post total right knee arthroplasty without evidence of hardware failure. Electronically Signed   By: Yvonne Kendall   On: 02/14/2021 09:02       Assessment & Plan:   Problem List Items Addressed This Visit     Anemia    Recent hgb wnl.       Ear lesion    bactroban as directed.  Follow.  Notify me if does not resolve.       GERD (gastroesophageal reflux disease)    No upper symptoms reported.  On omeprazole.        Hypercholesterolemia    Continue lipitor. Unable to titrate dose.  Tolerating.   Low cholesterol diet and exercise.  Follow lipid panel and liver function tests. Discussed possible repatha if problems with oral statins.  Lab Results  Component Value Date   CHOL 176 02/28/2021   HDL 52.10 02/28/2021   LDLCALC 103 (H) 02/28/2021   LDLDIRECT 164.2 02/11/2013   TRIG 103.0 02/28/2021   CHOLHDL 3 02/28/2021       Relevant Medications   furosemide (LASIX) 20 MG tablet   Other Relevant Orders   Lipid panel   Hepatic function panel   Basic metabolic panel   Hyperglycemia - Primary    Low carb diet and exercise.  Follow met b and a1c.  Recent a1c 5.4.         Relevant Orders   Hemoglobin A1c   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[facility])    Chronic back pain.  Followed by pain clinic.       Knee swelling    S/p knee replacement.  Seeing ortho.  Swelling and pain as outlined.  S/p MRI and CT - unrevealing.  Wearing a brace.  Continue f/u with ortho.        Non-Hodgkin's lymphoma ([place])    Followed by Dr Leretha Pol.  Stable.  Follows up yearly.         Einar Pheasant, MD

## 2021-03-07 NOTE — Assessment & Plan Note (Signed)
Followed by Dr DeCastro.  Stable.  Follows up yearly.  

## 2021-03-07 NOTE — Assessment & Plan Note (Signed)
Low carb diet and exercise.  Follow met b and a1c.  Recent a1c 5.4.

## 2021-03-07 NOTE — Assessment & Plan Note (Signed)
No upper symptoms reported.  On omeprazole.  

## 2021-03-07 NOTE — Assessment & Plan Note (Signed)
Chronic back pain.  Followed by pain clinic.  

## 2021-03-07 NOTE — Assessment & Plan Note (Signed)
S/p knee replacement.  Seeing ortho.  Swelling and pain as outlined.  S/p MRI and CT - unrevealing.  Wearing a brace.  Continue f/u with ortho.

## 2021-03-07 NOTE — Assessment & Plan Note (Signed)
Recent hgb wnl.  

## 2021-03-07 NOTE — Assessment & Plan Note (Addendum)
Continue lipitor. Unable to titrate dose.  Tolerating.   Low cholesterol diet and exercise.  Follow lipid panel and liver function tests. Discussed possible repatha if problems with oral statins.  Lab Results  Component Value Date   CHOL 176 02/28/2021   HDL 52.10 02/28/2021   LDLCALC 103 (H) 02/28/2021   LDLDIRECT 164.2 02/11/2013   TRIG 103.0 02/28/2021   CHOLHDL 3 02/28/2021

## 2021-03-07 NOTE — Assessment & Plan Note (Signed)
bactroban as directed.  Follow.  Notify me if does not resolve.

## 2021-03-13 DIAGNOSIS — G8929 Other chronic pain: Secondary | ICD-10-CM | POA: Diagnosis not present

## 2021-03-13 DIAGNOSIS — M545 Low back pain, unspecified: Secondary | ICD-10-CM | POA: Diagnosis not present

## 2021-03-13 DIAGNOSIS — M542 Cervicalgia: Secondary | ICD-10-CM | POA: Diagnosis not present

## 2021-03-13 DIAGNOSIS — M5030 Other cervical disc degeneration, unspecified cervical region: Secondary | ICD-10-CM | POA: Diagnosis not present

## 2021-03-13 DIAGNOSIS — M5430 Sciatica, unspecified side: Secondary | ICD-10-CM | POA: Diagnosis not present

## 2021-04-05 ENCOUNTER — Telehealth: Payer: Self-pay | Admitting: Internal Medicine

## 2021-04-05 NOTE — Telephone Encounter (Signed)
Pt has not heard from [facility] about scheduling her mammogram ?

## 2021-04-10 ENCOUNTER — Ambulatory Visit: Payer: Medicare Other | Admitting: Dermatology

## 2021-04-10 DIAGNOSIS — G894 Chronic pain syndrome: Secondary | ICD-10-CM | POA: Diagnosis not present

## 2021-04-10 DIAGNOSIS — M5030 Other cervical disc degeneration, unspecified cervical region: Secondary | ICD-10-CM | POA: Diagnosis not present

## 2021-04-10 DIAGNOSIS — M542 Cervicalgia: Secondary | ICD-10-CM | POA: Diagnosis not present

## 2021-04-10 DIAGNOSIS — G8929 Other chronic pain: Secondary | ICD-10-CM | POA: Diagnosis not present

## 2021-04-10 DIAGNOSIS — M5136 Other intervertebral disc degeneration, lumbar region: Secondary | ICD-10-CM | POA: Diagnosis not present

## 2021-04-10 NOTE — Telephone Encounter (Signed)
Called patient. Unable to leave message- memory full. Attempted to call [place] [facility]. On hold for >10 minutes. Unable to reach anyone. Sent my chart message to patient and provided number to call and schedule.  ?

## 2021-04-10 NOTE — Telephone Encounter (Signed)
See my chart message

## 2021-04-11 ENCOUNTER — Other Ambulatory Visit: Payer: Self-pay | Admitting: Internal Medicine

## 2021-04-17 DIAGNOSIS — C8238 Follicular lymphoma grade IIIa, lymph nodes of multiple sites: Secondary | ICD-10-CM | POA: Diagnosis not present

## 2021-05-01 ENCOUNTER — Other Ambulatory Visit: Payer: Self-pay | Admitting: Internal Medicine

## 2021-05-01 ENCOUNTER — Other Ambulatory Visit: Payer: Self-pay | Admitting: Surgery

## 2021-05-01 DIAGNOSIS — M1711 Unilateral primary osteoarthritis, right knee: Secondary | ICD-10-CM | POA: Diagnosis not present

## 2021-05-01 DIAGNOSIS — Z96651 Presence of right artificial knee joint: Secondary | ICD-10-CM

## 2021-05-01 DIAGNOSIS — G8929 Other chronic pain: Secondary | ICD-10-CM

## 2021-05-01 DIAGNOSIS — M25561 Pain in right knee: Secondary | ICD-10-CM | POA: Diagnosis not present

## 2021-05-04 ENCOUNTER — Telehealth: Payer: Self-pay | Admitting: Internal Medicine

## 2021-05-04 ENCOUNTER — Ambulatory Visit
Admission: RE | Admit: 2021-05-04 | Discharge: 2021-05-04 | Disposition: A | Discharge status: Home / Self Care | Payer: Medicare Other | Source: Ambulatory Visit | Attending: Surgery | Admitting: Surgery

## 2021-05-04 DIAGNOSIS — M25561 Pain in right knee: Secondary | ICD-10-CM | POA: Diagnosis not present

## 2021-05-04 DIAGNOSIS — G8929 Other chronic pain: Secondary | ICD-10-CM | POA: Diagnosis not present

## 2021-05-04 DIAGNOSIS — Z471 Aftercare following joint replacement surgery: Secondary | ICD-10-CM | POA: Diagnosis not present

## 2021-05-04 DIAGNOSIS — Z96651 Presence of right artificial knee joint: Secondary | ICD-10-CM | POA: Diagnosis not present

## 2021-05-04 MED ORDER — TECHNETIUM TC 99M MEDRONATE IV KIT
20.0000 | PACK | Freq: Once | INTRAVENOUS | Status: AC | PRN
  Administered 2021-05-04: 20.32 via INTRAVENOUS

## 2021-05-04 NOTE — Telephone Encounter (Signed)
Medication refilled

## 2021-05-04 NOTE — Telephone Encounter (Signed)
Patient called and said she has been trying to ger her  stradiol (ESTRACE) 0.5 MG tablet refilled from her pharmacy for 2 days. Pharmacy told patient they are waiting for patient's provider to reply. Patient is now out of her medication. ?

## 2021-05-09 DIAGNOSIS — G8929 Other chronic pain: Secondary | ICD-10-CM | POA: Diagnosis not present

## 2021-05-09 DIAGNOSIS — G894 Chronic pain syndrome: Secondary | ICD-10-CM | POA: Diagnosis not present

## 2021-05-09 DIAGNOSIS — M792 Neuralgia and neuritis, unspecified: Secondary | ICD-10-CM | POA: Diagnosis not present

## 2021-05-09 DIAGNOSIS — M4807 Spinal stenosis, lumbosacral region: Secondary | ICD-10-CM | POA: Diagnosis not present

## 2021-05-18 ENCOUNTER — Other Ambulatory Visit: Payer: Self-pay | Admitting: Internal Medicine

## 2021-05-19 DIAGNOSIS — Z20822 Contact with and (suspected) exposure to covid-19: Secondary | ICD-10-CM | POA: Diagnosis not present

## 2021-06-02 DIAGNOSIS — Z1231 Encounter for screening mammogram for malignant neoplasm of breast: Secondary | ICD-10-CM | POA: Diagnosis not present

## 2021-06-02 LAB — HM MAMMOGRAPHY

## 2021-06-12 DIAGNOSIS — Z96659 Presence of unspecified artificial knee joint: Secondary | ICD-10-CM | POA: Insufficient documentation

## 2021-06-12 DIAGNOSIS — T84038S Mechanical loosening of other internal prosthetic joint, sequela: Secondary | ICD-10-CM | POA: Insufficient documentation

## 2021-06-12 DIAGNOSIS — M1711 Unilateral primary osteoarthritis, right knee: Secondary | ICD-10-CM | POA: Diagnosis not present

## 2021-06-12 DIAGNOSIS — T84038D Mechanical loosening of other internal prosthetic joint, subsequent encounter: Secondary | ICD-10-CM | POA: Diagnosis not present

## 2021-06-12 DIAGNOSIS — Z96651 Presence of right artificial knee joint: Secondary | ICD-10-CM | POA: Diagnosis not present

## 2021-06-20 DIAGNOSIS — M5136 Other intervertebral disc degeneration, lumbar region: Secondary | ICD-10-CM | POA: Diagnosis not present

## 2021-06-20 DIAGNOSIS — M25519 Pain in unspecified shoulder: Secondary | ICD-10-CM | POA: Diagnosis not present

## 2021-06-20 DIAGNOSIS — M9951 Intervertebral disc stenosis of neural canal of cervical region: Secondary | ICD-10-CM | POA: Diagnosis not present

## 2021-06-20 DIAGNOSIS — F419 Anxiety disorder, unspecified: Secondary | ICD-10-CM | POA: Diagnosis not present

## 2021-06-20 DIAGNOSIS — G894 Chronic pain syndrome: Secondary | ICD-10-CM | POA: Diagnosis not present

## 2021-06-20 DIAGNOSIS — M48062 Spinal stenosis, lumbar region with neurogenic claudication: Secondary | ICD-10-CM | POA: Diagnosis not present

## 2021-06-20 DIAGNOSIS — M792 Neuralgia and neuritis, unspecified: Secondary | ICD-10-CM | POA: Diagnosis not present

## 2021-06-20 DIAGNOSIS — M47897 Other spondylosis, lumbosacral region: Secondary | ICD-10-CM | POA: Diagnosis not present

## 2021-06-20 DIAGNOSIS — G8929 Other chronic pain: Secondary | ICD-10-CM | POA: Diagnosis not present

## 2021-06-20 DIAGNOSIS — G4700 Insomnia, unspecified: Secondary | ICD-10-CM | POA: Diagnosis not present

## 2021-06-20 DIAGNOSIS — M25559 Pain in unspecified hip: Secondary | ICD-10-CM | POA: Diagnosis not present

## 2021-06-20 DIAGNOSIS — M5030 Other cervical disc degeneration, unspecified cervical region: Secondary | ICD-10-CM | POA: Diagnosis not present

## 2021-07-04 ENCOUNTER — Other Ambulatory Visit (INDEPENDENT_AMBULATORY_CARE_PROVIDER_SITE_OTHER): Payer: Medicare Other

## 2021-07-04 DIAGNOSIS — E7800 Pure hypercholesterolemia, unspecified: Secondary | ICD-10-CM

## 2021-07-04 DIAGNOSIS — R739 Hyperglycemia, unspecified: Secondary | ICD-10-CM

## 2021-07-04 LAB — HEPATIC FUNCTION PANEL
ALT: 14 U/L (ref 0–35)
AST: 20 U/L (ref 0–37)
Albumin: 4.1 g/dL (ref 3.5–5.2)
Alkaline Phosphatase: 70 U/L (ref 39–117)
Bilirubin, Direct: 0.1 mg/dL (ref 0.0–0.3)
Total Bilirubin: 0.6 mg/dL (ref 0.2–1.2)
Total Protein: 6.5 g/dL (ref 6.0–8.3)

## 2021-07-04 LAB — LIPID PANEL
Cholesterol: 174 mg/dL (ref 0–200)
HDL: 52.9 mg/dL (ref >39.00)
LDL Cholesterol: 102 mg/dL — ABNORMAL HIGH (ref 0–99)
NonHDL: 121.33
Total CHOL/HDL Ratio: 3
Triglycerides: 98 mg/dL (ref 0.0–149.0)
VLDL: 19.6 mg/dL (ref 0.0–40.0)

## 2021-07-04 LAB — BASIC METABOLIC PANEL
BUN: 11 mg/dL (ref 6–23)
CO2: 34 mEq/L — ABNORMAL HIGH (ref 19–32)
Calcium: 9.3 mg/dL (ref 8.4–10.5)
Chloride: 101 mEq/L (ref 96–112)
Creatinine, Ser: 0.84 mg/dL (ref 0.40–1.20)
GFR: 69.34 mL/min (ref >60.00)
Glucose, Bld: 95 mg/dL (ref 70–99)
Potassium: 4 mEq/L (ref 3.5–5.1)
Sodium: 140 mEq/L (ref 135–145)

## 2021-07-04 LAB — HEMOGLOBIN A1C: Hgb A1c MFr Bld: 5.5 % (ref 4.6–6.5)

## 2021-07-06 ENCOUNTER — Ambulatory Visit (INDEPENDENT_AMBULATORY_CARE_PROVIDER_SITE_OTHER): Payer: Medicare Other | Admitting: Internal Medicine

## 2021-07-06 ENCOUNTER — Encounter: Payer: Self-pay | Admitting: Internal Medicine

## 2021-07-06 VITALS — BP 124/72 | HR 70 | Temp 98.0°F | Resp 15 | Ht 67.0 in | Wt 174.8 lb

## 2021-07-06 DIAGNOSIS — E7800 Pure hypercholesterolemia, unspecified: Secondary | ICD-10-CM

## 2021-07-06 DIAGNOSIS — E2839 Other primary ovarian failure: Secondary | ICD-10-CM | POA: Diagnosis not present

## 2021-07-06 DIAGNOSIS — Z1231 Encounter for screening mammogram for malignant neoplasm of breast: Secondary | ICD-10-CM | POA: Diagnosis not present

## 2021-07-06 DIAGNOSIS — R739 Hyperglycemia, unspecified: Secondary | ICD-10-CM

## 2021-07-06 DIAGNOSIS — C8590 Non-Hodgkin lymphoma, unspecified, unspecified site: Secondary | ICD-10-CM

## 2021-07-06 DIAGNOSIS — M4696 Unspecified inflammatory spondylopathy, lumbar region: Secondary | ICD-10-CM | POA: Diagnosis not present

## 2021-07-06 DIAGNOSIS — K219 Gastro-esophageal reflux disease without esophagitis: Secondary | ICD-10-CM | POA: Diagnosis not present

## 2021-07-06 DIAGNOSIS — M25469 Effusion, unspecified knee: Secondary | ICD-10-CM

## 2021-07-06 DIAGNOSIS — Z1239 Encounter for other screening for malignant neoplasm of breast: Secondary | ICD-10-CM | POA: Insufficient documentation

## 2021-07-06 DIAGNOSIS — Z0000 Encounter for general adult medical examination without abnormal findings: Secondary | ICD-10-CM

## 2021-07-06 DIAGNOSIS — D649 Anemia, unspecified: Secondary | ICD-10-CM | POA: Diagnosis not present

## 2021-07-06 MED ORDER — FUROSEMIDE 20 MG PO TABS: ORAL_TABLET | ORAL | 0 refills | Status: DC

## 2021-07-06 NOTE — Assessment & Plan Note (Signed)
Mammogram 06/02/21 - ok - [facility] II.

## 2021-07-06 NOTE — Progress Notes (Unsigned)
Patient ID: Maria Keller, female   DOB: [birth year], 71 y.o.   MRN: [id number]   Subjective:    Patient ID: Maria Keller, female    DOB: 05/23/1950, 71 y.o.   MRN: [id number]  This visit occurred during the SARS-CoV-2 public health emergency.  Safety protocols were in place, including screening questions prior to the visit, additional usage of staff PPE, and extensive cleaning of exam room while observing appropriate contact time as indicated for disinfecting solutions.   Patient here for a scheduled follow up.   HPI Here follow up regarding her cholesterol.  Has been having problems with her knee.  Seeing Dr Roland Rack.  Is going to have to have another surgery.  Limiting her activity.  Discussed diet and exercise.  She had questions about weight loss medications.  No chest pain or sob.  No acid reflux reported.  No abdominal pain.  Bowels moving.  Discussed labs and cholesterol medication. Discussed other treatment options for cholesterol given aching with statins.     Past Medical History:  Diagnosis Date   Anemia    iron deficiency and B12 deficiency   Arthritis    Bronchitis 08/2015   Chronic back pain    followed by Dr Primus Bravo   Depression    Diverticulosis    Esophageal stricture    requiring dilatation x 2   Fibromyalgia    Fibromyalgia    Fibromyalgia    Follicular lymphoma ([place])    Followed by Dr Leretha Pol, s/p chemo and XRT   Follicular lymphoma ([place])    GERD (gastroesophageal reflux disease)    GERD (gastroesophageal reflux disease)    Hemorrhoid    Hiatal hernia    History of kidney stones    Hypercholesterolemia    IBS (irritable bowel syndrome)    IBS (irritable bowel syndrome)    Nephrolithiasis    followed by Dr Bernardo Heater, s/p stents   PONV (postoperative nausea and vomiting)    Shingles outbreak 11/26/2011   Sleep apnea    Past Surgical History:  Procedure Laterality Date   APPENDECTOMY     BLADDER SURGERY  1993   BREAST BIOPSY N/A    capsule endoscopy      CHOLECYSTECTOMY  1995   COLONOSCOPY WITH PROPOFOL N/A 06/19/2017   Procedure: COLONOSCOPY WITH PROPOFOL;  Surgeon: Manya Silvas, MD;  Location: [facility] ENDOSCOPY;  Service: Endoscopy;  Laterality: N/A;   ESOPHAGOGASTRODUODENOSCOPY (EGD) WITH PROPOFOL N/A 06/19/2017   Procedure: ESOPHAGOGASTRODUODENOSCOPY (EGD) WITH PROPOFOL;  Surgeon: Manya Silvas, MD;  Location: [facility] ENDOSCOPY;  Service: Endoscopy;  Laterality: N/A;   FRACTURE SURGERY     history of helicobacter pylori infection     JOINT REPLACEMENT     LEFT OOPHORECTOMY     LEG SURGERY Left    ORIF ANKLE FRACTURE  2012   OVARIAN CYST REMOVAL     right   RECTOCELE REPAIR N/A    REPLACEMENT TOTAL KNEE Right 03/19/2016   TONSILECTOMY/ADENOIDECTOMY WITH MYRINGOTOMY  1970   TONSILLECTOMY N/A    TUBAL LIGATION  1976   VAGINAL HYSTERECTOMY  1980's   secondary to bleeding   Family History  Problem Relation Age of Onset   Ulcers Mother    Ulcers Father    Breast cancer Other        great aunt and grandfather   Lung cancer Other        uncle   Hypertension Maternal Grandmother    Heart disease Maternal Grandmother  MI 23s   Diabetes Maternal Grandmother    Arthritis Maternal Grandmother    Diabetes Maternal Grandfather    Social History   Socioeconomic History   Marital status: Married    Spouse name: Not on file   Number of children: Not on file   Years of education: Not on file   Highest education level: Not on file  Occupational History   Not on file  Tobacco Use   Smoking status: Never   Smokeless tobacco: Never  Vaping Use   Vaping Use: Never used  Substance and Sexual Activity   Alcohol use: No    Alcohol/week: 0.0 standard drinks   Drug use: No   Sexual activity: Yes  Other Topics Concern   Not on file  Social History Narrative   Not on file   Social Determinants of Health   Financial Resource Strain: Low Risk    Difficulty of Paying Living Expenses: Not hard at all  Food Insecurity: No Food  Insecurity   Worried About Charity fundraiser in the Last Year: Never true   [place] in the Last Year: Never true  Transportation Needs: No Transportation Needs   Lack of Transportation (Medical): No   Lack of Transportation (Non-Medical): No  Physical Activity: Not on file  Stress: No Stress Concern Present   Feeling of Stress : Not at all  Social Connections: Socially Integrated   Frequency of Communication with Friends and Family: More than three times a week   Frequency of Social Gatherings with Friends and Family: More than three times a week   Attends Religious Services: More than 4 times per year   Active Member of Clubs or Organizations: Not on file   Attends Music therapist: More than 4 times per year   Marital Status: Married     Review of Systems     Objective:     BP 124/72 (BP Location: Left Arm, Patient Position: Sitting, Cuff Size: Small)   Pulse 70   Temp 98 F (36.7 C) (Temporal)   Resp 15   Ht '5\' 7"'$  (1.702 m)   Wt 174 lb 12.8 oz (79.3 kg)   SpO2 97%   BMI 27.38 kg/m  Wt Readings from Last 3 Encounters:  07/06/21 174 lb 12.8 oz (79.3 kg)  03/07/21 169 lb (76.7 kg)  12/21/20 171 lb 3.2 oz (77.7 kg)    Physical Exam   Outpatient Encounter Medications as of 07/06/2021  Medication Sig   aspirin EC 81 MG tablet Take by mouth.   atorvastatin (LIPITOR) 10 MG tablet TAKE 1 TABLET BY MOUTH DAILY   azelastine (ASTELIN) 0.1 % nasal spray 1 SPRAY IN EACH NOSTRIL TWICE DAILY AS DIRECTED   calcium carbonate (TUMS - DOSED IN MG ELEMENTAL CALCIUM) 500 MG chewable tablet Chew 1 tablet by mouth as needed.   cetirizine (ZYRTEC) 10 MG tablet Take by mouth.   Diphenhyd-Hydrocort-Nystatin (FIRST-DUKES MOUTHWASH) SUSP Swish and spit 5 mLs tid prn   estradiol (ESTRACE) 0.5 MG tablet TAKE 1 TABLET BY MOUTH DAILY.   fentaNYL (DURAGESIC) 100 MCG/HR Apply 1 patch to skin every 2 days with fentanyl patch 25 g per hour if tolerated   fentaNYL (DURAGESIC)  25 MCG/HR patch Apply 1 patch to skin every 2 days with fentanyl patch 100 g per hour if tolerated   fluticasone (FLONASE) 50 MCG/ACT nasal spray Place 2 sprays into both nostrils daily.   furosemide (LASIX) 20 MG tablet TAKE ONE TABLET  DAILY AS NEEDED   meloxicam (MOBIC) 15 MG tablet Take 15 mg by mouth daily. (Patient not taking: Reported on 07/06/2021)   mupirocin ointment (BACTROBAN) 2 % Apply to affected area bid.   omeprazole (PRILOSEC) 20 MG capsule TAKE 1 CAPSULE BY MOUTH TWICE DAILY   oxyCODONE (ROXICODONE) 5 MG immediate release tablet Limit 1 tab by mouth per day or twice per day if tolerated for breakthrough pain while wearing fentanyl patches   senna-docusate (SENOKOT-S) 8.6-50 MG per tablet Take by mouth.   No facility-administered encounter medications on file as of 07/06/2021.     Lab Results  Component Value Date   WBC 6.3 02/28/2021   HGB 12.3 02/28/2021   HCT 37.8 02/28/2021   PLT 181.0 02/28/2021   GLUCOSE 95 07/04/2021   CHOL 174 07/04/2021   TRIG 98.0 07/04/2021   HDL 52.90 07/04/2021   LDLDIRECT 164.2 02/11/2013   LDLCALC 102 (H) 07/04/2021   ALT 14 07/04/2021   AST 20 07/04/2021   NA 140 07/04/2021   K 4.0 07/04/2021   CL 101 07/04/2021   CREATININE 0.84 07/04/2021   BUN 11 07/04/2021   CO2 34 (H) 07/04/2021   TSH 1.61 02/28/2021   HGBA1C 5.5 07/04/2021    NM Bone Scan 3 Phase  Result Date: 05/04/2021 CLINICAL DATA:  Post RIGHT total knee arthroplasty Z96.651 in 2018, chronic RIGHT knee pain, question loosening EXAM: NUCLEAR MEDICINE 3-PHASE BONE SCAN TECHNIQUE: Radionuclide angiographic images, immediate static blood pool images, and 3-hour delayed static images were obtained of the knees after intravenous injection of radiopharmaceutical. RADIOPHARMACEUTICALS:  20.32 mCi Tc-14mMDP IV COMPARISON:  CT RIGHT knee 02/13/2021 FINDINGS: Vascular phase: Normal blood flow to both knees Blood pool phase: Normal blood pool at both knees Delayed phase: Abnormal  delayed uptake of tracer at the LEFT knee consistent with degenerative changes. Photopenic defect at RIGHT knee from prosthesis. Increased tracer uptake at medial and lateral RIGHT tibial plateau adjacent to the tibial component of the RIGHT knee prosthesis suspicious for aseptic loosening. No abnormal tracer uptake adjacent to the femoral component of the RIGHT knee prosthesis. IMPRESSION: Delayed uptake of tracer at the medial and lateral RIGHT tibial plateau adjacent to the tibial component of the RIGHT knee prosthesis concerning for aseptic loosening. Electronically Signed   By: MLavonia DanaM.D.   On: 05/04/2021 14:54       Assessment & Plan:   Problem List Items Addressed This Visit   None    CEinar Pheasant MD

## 2021-07-08 ENCOUNTER — Encounter: Payer: Self-pay | Admitting: Internal Medicine

## 2021-07-08 DIAGNOSIS — E2839 Other primary ovarian failure: Secondary | ICD-10-CM | POA: Insufficient documentation

## 2021-07-08 NOTE — Assessment & Plan Note (Signed)
Continue lipitor. Unable to titrate dose.  Tolerating.   Low cholesterol diet and exercise.  Follow lipid panel and liver function tests. Discussed zetia and possible repatha if problems with oral statins.  Lab Results  Component Value Date   CHOL 174 07/04/2021   HDL 52.90 07/04/2021   LDLCALC 102 (H) 07/04/2021   LDLDIRECT 164.2 02/11/2013   TRIG 98.0 07/04/2021   CHOLHDL 3 07/04/2021

## 2021-07-08 NOTE — Assessment & Plan Note (Signed)
Schedule bone density.    

## 2021-07-08 NOTE — Assessment & Plan Note (Addendum)
Low carb diet and exercise.  Follow met b and a1c.

## 2021-07-08 NOTE — Assessment & Plan Note (Signed)
Persistent knee pain.  Seeing Dr Roland Rack. Planning for future surgery.  No date yet.

## 2021-07-08 NOTE — Assessment & Plan Note (Signed)
Chronic back pain.  Followed by pain clinic.  

## 2021-07-08 NOTE — Assessment & Plan Note (Signed)
Followed by Dr DeCastro.  Stable.  Follows up yearly.  

## 2021-07-08 NOTE — Assessment & Plan Note (Signed)
Follow cbc.  

## 2021-07-08 NOTE — Assessment & Plan Note (Signed)
No upper symptoms reported.  On omeprazole.  

## 2021-07-11 ENCOUNTER — Encounter: Payer: Self-pay | Admitting: Internal Medicine

## 2021-07-18 DIAGNOSIS — M542 Cervicalgia: Secondary | ICD-10-CM | POA: Diagnosis not present

## 2021-07-18 DIAGNOSIS — M5030 Other cervical disc degeneration, unspecified cervical region: Secondary | ICD-10-CM | POA: Diagnosis not present

## 2021-07-18 DIAGNOSIS — M5136 Other intervertebral disc degeneration, lumbar region: Secondary | ICD-10-CM | POA: Diagnosis not present

## 2021-07-18 DIAGNOSIS — G894 Chronic pain syndrome: Secondary | ICD-10-CM | POA: Diagnosis not present

## 2021-07-21 ENCOUNTER — Telehealth (INDEPENDENT_AMBULATORY_CARE_PROVIDER_SITE_OTHER): Payer: Medicare Other | Admitting: Internal Medicine

## 2021-07-21 VITALS — Ht 67.0 in | Wt 175.0 lb

## 2021-07-21 DIAGNOSIS — S025XXD Fracture of tooth (traumatic), subsequent encounter for fracture with routine healing: Secondary | ICD-10-CM

## 2021-07-21 DIAGNOSIS — R300 Dysuria: Secondary | ICD-10-CM

## 2021-07-21 LAB — POCT URINALYSIS DIPSTICK
Bilirubin, UA: NEGATIVE
Glucose, UA: NEGATIVE
Ketones, UA: NEGATIVE
Nitrite, UA: NEGATIVE
Protein, UA: POSITIVE — AB
Spec Grav, UA: 1.015 (ref 1.010–1.025)
Urobilinogen, UA: 0.2 E.U./dL
pH, UA: 7 (ref 5.0–8.0)

## 2021-07-21 LAB — URINALYSIS, MICROSCOPIC ONLY

## 2021-07-21 MED ORDER — FLUCONAZOLE 150 MG PO TABS: ORAL_TABLET | ORAL | 0 refills | Status: DC

## 2021-07-21 MED ORDER — NITROFURANTOIN MONOHYD MACRO 100 MG PO CAPS: 100.0000 mg | ORAL_CAPSULE | Freq: Two times a day (BID) | ORAL | 0 refills | Status: DC

## 2021-07-21 NOTE — Progress Notes (Unsigned)
Patient ID: Maria Keller, female   DOB: 12-19-1950, 71 y.o.   MRN: [id number]   Virtual Visit via video Note   All issues noted in this document were discussed and addressed.  No physical exam was performed (except for noted visual exam findings with Video Visits).   I connected with Kristopher Glee by a video enabled telemedicine application or telephone and verified that I am speaking with the correct person using two identifiers. Location patient: home Location provider: work or home office Persons participating in the virtual visit: patient, provider  I discussed the limitations, risks, security and privacy concerns of performing an evaluation and management service by telephone and the availability of in person appointments. I also discussed with the patient that there may be a patient responsible charge related to this service. The patient expressed understanding and agreed to proceed.  Interactive audio and video telecommunications were attempted between this provider and patient, however failed, due to patient having technical difficulties OR patient did not have access to video capability.  We continued and completed visit with audio only. ***  Reason for visit: ***  HPI: ***   ROS: See pertinent positives and negatives per HPI.  Past Medical History:  Diagnosis Date   Anemia    iron deficiency and B12 deficiency   Arthritis    Bronchitis 08/2015   Chronic back pain    followed by Dr Primus Bravo   Depression    Diverticulosis    Esophageal stricture    requiring dilatation x 2   Fibromyalgia    Fibromyalgia    Fibromyalgia    Follicular lymphoma ([place])    Followed by Dr Leretha Pol, s/p chemo and XRT   Follicular lymphoma ([place])    GERD (gastroesophageal reflux disease)    GERD (gastroesophageal reflux disease)    Hemorrhoid    Hiatal hernia    History of kidney stones    Hypercholesterolemia    IBS (irritable bowel syndrome)    IBS (irritable bowel syndrome)     Nephrolithiasis    followed by Dr Bernardo Heater, s/p stents   PONV (postoperative nausea and vomiting)    Shingles outbreak 11/26/2011   Sleep apnea     Past Surgical History:  Procedure Laterality Date   APPENDECTOMY     BLADDER SURGERY  1993   BREAST BIOPSY N/A    capsule endoscopy     CHOLECYSTECTOMY  1995   COLONOSCOPY WITH PROPOFOL N/A 06/19/2017   Procedure: COLONOSCOPY WITH PROPOFOL;  Surgeon: Manya Silvas, MD;  Location: [facility] ENDOSCOPY;  Service: Endoscopy;  Laterality: N/A;   ESOPHAGOGASTRODUODENOSCOPY (EGD) WITH PROPOFOL N/A 06/19/2017   Procedure: ESOPHAGOGASTRODUODENOSCOPY (EGD) WITH PROPOFOL;  Surgeon: Manya Silvas, MD;  Location: [facility] ENDOSCOPY;  Service: Endoscopy;  Laterality: N/A;   FRACTURE SURGERY     history of helicobacter pylori infection     JOINT REPLACEMENT     LEFT OOPHORECTOMY     LEG SURGERY Left    ORIF ANKLE FRACTURE  2012   OVARIAN CYST REMOVAL     right   RECTOCELE REPAIR N/A    REPLACEMENT TOTAL KNEE Right 03/19/2016   TONSILECTOMY/ADENOIDECTOMY WITH MYRINGOTOMY  1970   TONSILLECTOMY N/A    TUBAL LIGATION  1976   VAGINAL HYSTERECTOMY  1980's   secondary to bleeding    Family History  Problem Relation Age of Onset   Ulcers Mother    Ulcers Father    Breast cancer Other        great aunt  and grandfather   Lung cancer Other        uncle   Hypertension Maternal Grandmother    Heart disease Maternal Grandmother        MI 61s   Diabetes Maternal Grandmother    Arthritis Maternal Grandmother    Diabetes Maternal Grandfather     SOCIAL HX: ***   Current Outpatient Medications:    aspirin EC 81 MG tablet, Take by mouth., Disp: , Rfl:    atorvastatin (LIPITOR) 10 MG tablet, TAKE 1 TABLET BY MOUTH DAILY, Disp: 30 tablet, Rfl: 1   azelastine (ASTELIN) 0.1 % nasal spray, 1 SPRAY IN EACH NOSTRIL TWICE DAILY AS DIRECTED, Disp: 30 mL, Rfl: 1   calcium carbonate (TUMS - DOSED IN MG ELEMENTAL CALCIUM) 500 MG chewable tablet, Chew 1 tablet by  mouth as needed., Disp: , Rfl:    cetirizine (ZYRTEC) 10 MG tablet, Take by mouth., Disp: , Rfl:    Diphenhyd-Hydrocort-Nystatin (FIRST-DUKES MOUTHWASH) SUSP, Swish and spit 5 mLs tid prn, Disp: 37 mL, Rfl: 0   estradiol (ESTRACE) 0.5 MG tablet, TAKE 1 TABLET BY MOUTH DAILY., Disp: 90 tablet, Rfl: 1   fentaNYL (DURAGESIC) 100 MCG/HR, Apply 1 patch to skin every 2 days with fentanyl patch 25 g per hour if tolerated, Disp: 15 patch, Rfl: 0   fentaNYL (DURAGESIC) 25 MCG/HR patch, Apply 1 patch to skin every 2 days with fentanyl patch 100 g per hour if tolerated, Disp: 15 patch, Rfl: 0   fentaNYL (DURAGESIC) 50 MCG/HR, SMARTSIG:1 Patch(s) Topical As Directed, Disp: , Rfl:    fluticasone (FLONASE) 50 MCG/ACT nasal spray, Place 2 sprays into both nostrils daily., Disp: 48 g, Rfl: 3   furosemide (LASIX) 20 MG tablet, TAKE ONE TABLET DAILY AS NEEDED, Disp: 90 tablet, Rfl: 0   lidocaine (LIDODERM) 5 %, SMARTSIG:Topical, Disp: , Rfl:    mupirocin ointment (BACTROBAN) 2 %, Apply to affected area bid., Disp: 22 g, Rfl: 0   omeprazole (PRILOSEC) 20 MG capsule, TAKE 1 CAPSULE BY MOUTH TWICE DAILY, Disp: 180 capsule, Rfl: 2   oxyCODONE (ROXICODONE) 5 MG immediate release tablet, Limit 1 tab by mouth per day or twice per day if tolerated for breakthrough pain while wearing fentanyl patches, Disp: 50 tablet, Rfl: 0   senna-docusate (SENOKOT-S) 8.6-50 MG per tablet, Take by mouth., Disp: , Rfl:   EXAM:  VITALS per patient if applicable:  GENERAL: alert, oriented, appears well and in no acute distress  HEENT: atraumatic, conjunttiva clear, no obvious abnormalities on inspection of external nose and ears  NECK: normal movements of the head and neck  LUNGS: on inspection no signs of respiratory distress, breathing rate appears normal, no obvious gross SOB, gasping or wheezing  CV: no obvious cyanosis  MS: moves all visible extremities without noticeable abnormality  PSYCH/NEURO: pleasant and cooperative,  no obvious depression or anxiety, speech and thought processing grossly intact  ASSESSMENT AND PLAN:  Discussed the following assessment and plan:  Problem List Items Addressed This Visit   None Visit Diagnoses     Dysuria    -  Primary   Relevant Orders   POCT Urinalysis Dipstick (Completed)   Urine Microscopic Only (Completed)   Urine Culture       No follow-ups on file.   I discussed the assessment and treatment plan with the patient. The patient was provided an opportunity to ask questions and all were answered. The patient agreed with the plan and demonstrated an understanding of the instructions.  The patient was advised to call back or seek an in-person evaluation if the symptoms worsen or if the condition fails to improve as anticipated.  I provided *** minutes of non-face-to-face time during this encounter.   Einar Pheasant, MD

## 2021-07-22 ENCOUNTER — Encounter: Payer: Self-pay | Admitting: Internal Medicine

## 2021-07-22 DIAGNOSIS — S025XXA Fracture of tooth (traumatic), initial encounter for closed fracture: Secondary | ICD-10-CM | POA: Insufficient documentation

## 2021-07-22 DIAGNOSIS — R300 Dysuria: Secondary | ICD-10-CM | POA: Insufficient documentation

## 2021-07-22 NOTE — Assessment & Plan Note (Signed)
Evaluated by her dentist.  Some drainage.  Saline nasal spray.  Follow.  F/u with dentist.  Call if persistent problems.

## 2021-07-22 NOTE — Assessment & Plan Note (Signed)
Symptoms and urine dip/urine micro - c/w UTI.  Discussed.  Discussed allergies.  Treat with macrobid.  Denies any problems previously with macrobid.  Stay hydrated.  Probiotics as directed.  Strain urine.  Will need f/u urinalysis to confirm blood clears.  Follow. Await culture results.

## 2021-07-24 ENCOUNTER — Other Ambulatory Visit: Payer: Self-pay | Admitting: Internal Medicine

## 2021-07-24 DIAGNOSIS — R319 Hematuria, unspecified: Secondary | ICD-10-CM

## 2021-07-24 LAB — URINE CULTURE
MICRO NUMBER:: 13535893
SPECIMEN QUALITY:: ADEQUATE

## 2021-07-24 NOTE — Progress Notes (Signed)
Order placed for f/u urinalysis 

## 2021-07-26 ENCOUNTER — Other Ambulatory Visit: Payer: Self-pay | Admitting: Family

## 2021-08-01 ENCOUNTER — Other Ambulatory Visit (INDEPENDENT_AMBULATORY_CARE_PROVIDER_SITE_OTHER): Payer: Medicare Other

## 2021-08-01 DIAGNOSIS — R319 Hematuria, unspecified: Secondary | ICD-10-CM | POA: Diagnosis not present

## 2021-08-01 DIAGNOSIS — R739 Hyperglycemia, unspecified: Secondary | ICD-10-CM

## 2021-08-01 DIAGNOSIS — E7800 Pure hypercholesterolemia, unspecified: Secondary | ICD-10-CM

## 2021-08-01 LAB — HEPATIC FUNCTION PANEL
ALT: 12 U/L (ref 0–35)
AST: 16 U/L (ref 0–37)
Albumin: 4 g/dL (ref 3.5–5.2)
Alkaline Phosphatase: 79 U/L (ref 39–117)
Bilirubin, Direct: 0.1 mg/dL (ref 0.0–0.3)
Total Bilirubin: 0.5 mg/dL (ref 0.2–1.2)
Total Protein: 6.5 g/dL (ref 6.0–8.3)

## 2021-08-01 LAB — URINALYSIS, ROUTINE W REFLEX MICROSCOPIC
Bilirubin Urine: NEGATIVE
Hgb urine dipstick: NEGATIVE
Ketones, ur: NEGATIVE
Leukocytes,Ua: NEGATIVE
Nitrite: NEGATIVE
RBC / HPF: NONE SEEN (ref 0–?)
Specific Gravity, Urine: <=1.005 — AB (ref 1.000–1.030)
Total Protein, Urine: NEGATIVE
Urine Glucose: NEGATIVE
Urobilinogen, UA: 0.2 (ref 0.0–1.0)
pH: 6.5 (ref 5.0–8.0)

## 2021-08-01 LAB — BASIC METABOLIC PANEL
BUN: 13 mg/dL (ref 6–23)
CO2: 33 mEq/L — ABNORMAL HIGH (ref 19–32)
Calcium: 9.3 mg/dL (ref 8.4–10.5)
Chloride: 99 mEq/L (ref 96–112)
Creatinine, Ser: 0.81 mg/dL (ref 0.40–1.20)
GFR: 72.39 mL/min (ref >60.00)
Glucose, Bld: 106 mg/dL — ABNORMAL HIGH (ref 70–99)
Potassium: 4.2 mEq/L (ref 3.5–5.1)
Sodium: 137 mEq/L (ref 135–145)

## 2021-08-01 LAB — LIPID PANEL
Cholesterol: 185 mg/dL (ref 0–200)
HDL: 54.1 mg/dL (ref >39.00)
LDL Cholesterol: 97 mg/dL (ref 0–99)
NonHDL: 131.09
Total CHOL/HDL Ratio: 3
Triglycerides: 169 mg/dL — ABNORMAL HIGH (ref 0.0–149.0)
VLDL: 33.8 mg/dL (ref 0.0–40.0)

## 2021-08-01 LAB — HEMOGLOBIN A1C: Hgb A1c MFr Bld: 5.5 % (ref 4.6–6.5)

## 2021-08-02 ENCOUNTER — Other Ambulatory Visit: Payer: Self-pay

## 2021-08-02 ENCOUNTER — Other Ambulatory Visit: Payer: Self-pay | Admitting: Internal Medicine

## 2021-08-02 MED ORDER — ATORVASTATIN CALCIUM 10 MG PO TABS: 10.0000 mg | ORAL_TABLET | Freq: Every day | ORAL | 2 refills | Status: DC

## 2021-08-02 NOTE — Addendum Note (Signed)
Addended by: Neta Ehlers on: 08/02/2021 08:21 AM   Modules accepted: Orders

## 2021-08-15 DIAGNOSIS — M5030 Other cervical disc degeneration, unspecified cervical region: Secondary | ICD-10-CM | POA: Diagnosis not present

## 2021-08-15 DIAGNOSIS — M5136 Other intervertebral disc degeneration, lumbar region: Secondary | ICD-10-CM | POA: Diagnosis not present

## 2021-08-15 DIAGNOSIS — M542 Cervicalgia: Secondary | ICD-10-CM | POA: Diagnosis not present

## 2021-08-15 DIAGNOSIS — G894 Chronic pain syndrome: Secondary | ICD-10-CM | POA: Diagnosis not present

## 2021-08-23 ENCOUNTER — Ambulatory Visit (INDEPENDENT_AMBULATORY_CARE_PROVIDER_SITE_OTHER): Payer: Medicare Other

## 2021-08-23 VITALS — Ht 67.0 in | Wt 175.0 lb

## 2021-08-23 DIAGNOSIS — Z0000 Encounter for general adult medical examination without abnormal findings: Secondary | ICD-10-CM | POA: Diagnosis not present

## 2021-08-23 NOTE — Patient Instructions (Addendum)
Maria Keller , Thank you for taking time to come for your Medicare Wellness Visit. I appreciate your ongoing commitment to your health goals. Please review the following plan we discussed and let me know if I can assist you in the future.   These are the goals we discussed:  Goals       Patient Stated     Maintain Healthy Lifestyle (pt-stated)      Stay active. Healthy diet.         This is a list of the screening recommended for you and due dates:  Health Maintenance  Topic Date Due   COVID-19 Vaccine (3 - Moderna risk series) 09/08/2021*   Flu Shot  09/05/2021   Colon Cancer Screening  06/20/2022   Mammogram  06/03/2023   Tetanus Vaccine  10/15/2028   Pneumonia Vaccine  Completed   DEXA scan (bone density measurement)  Completed   Hepatitis C Screening: USPSTF Recommendation to screen - Ages 22-79 yo.  Completed   Zoster (Shingles) Vaccine  Completed   HPV Vaccine  Aged Out  *Topic was postponed. The date shown is not the original due date.   Opioid Pain Medicine Management Opioids are powerful medicines that are used to treat moderate to severe pain. When used for short periods of time, they can help you to: Sleep better. Do better in physical or occupational therapy. Feel better in the first few days after an injury. Recover from surgery. Opioids should be taken with the supervision of a trained health care provider. They should be taken for the shortest period of time possible. This is because opioids can be addictive, and the longer you take opioids, the greater your risk of addiction. This addiction can also be called opioid use disorder. What are the risks? Using opioid pain medicines for longer than 3 days increases your risk of side effects. Side effects include: Constipation. Nausea and vomiting. Breathing difficulties (respiratory depression). Drowsiness. Confusion. Opioid use disorder. Itching. Taking opioid pain medicine for a long period of time can affect  your ability to do daily tasks. It also puts you at risk for: Motor vehicle crashes. Depression. Suicide. Heart attack. Overdose, which can be life-threatening. What is a pain treatment plan? A pain treatment plan is an agreement between you and your health care provider. Pain is unique to each person, and treatments vary depending on your condition. To manage your pain, you and your health care provider need to work together. To help you do this: Discuss the goals of your treatment, including how much pain you might expect to have and how you will manage the pain. Review the risks and benefits of taking opioid medicines. Remember that a good treatment plan uses more than one approach and minimizes the chance of side effects. Be honest about the amount of medicines you take and about any drug or alcohol use. Get pain medicine prescriptions from only one health care provider. Pain can be managed with many types of alternative treatments. Ask your health care provider to refer you to one or more specialists who can help you manage pain through: Physical or occupational therapy. Counseling (cognitive behavioral therapy). Good nutrition. Biofeedback. Massage. Meditation. Non-opioid medicine. Following a gentle exercise program. How to use opioid pain medicine Taking medicine Take your pain medicine exactly as told by your health care provider. Take it only when you need it. If your pain gets less severe, you may take less than your prescribed dose if your health care provider approves.  If you are not having pain, do nottake pain medicine unless your health care provider tells you to take it. If your pain is severe, do nottry to treat it yourself by taking more pills than instructed on your prescription. Contact your health care provider for help. Write down the times when you take your pain medicine. It is easy to become confused while on pain medicine. Writing the time can help you avoid  overdose. Take other over-the-counter or prescription medicines only as told by your health care provider. Keeping yourself and others safe  While you are taking opioid pain medicine: Do not drive, use machinery, or power tools. Do not sign legal documents. Do not drink alcohol. Do not take sleeping pills. Do not supervise children by yourself. Do not do activities that require climbing or being in high places. Do not go to a lake, river, ocean, spa, or swimming pool. Do not share your pain medicine with anyone. Keep pain medicine in a locked cabinet or in a secure area where pets and children cannot reach it. Stopping your use of opioids If you have been taking opioid medicine for more than a few weeks, you may need to slowly decrease (taper) how much you take until you stop completely. Tapering your use of opioids can decrease your risk of symptoms of withdrawal, such as: Pain and cramping in the abdomen. Nausea. Sweating. Sleepiness. Restlessness. Uncontrollable shaking (tremors). Cravings for the medicine. Do not attempt to taper your use of opioids on your own. Talk with your health care provider about how to do this. Your health care provider may prescribe a step-down schedule based on how much medicine you are taking and how long you have been taking it. Getting rid of leftover pills Do not save any leftover pills. Get rid of leftover pills safely by: Taking the medicine to a prescription take-back program. This is usually offered by the county or law enforcement. Bringing them to a pharmacy that has a drug disposal container. Flushing them down the toilet. Check the label or package insert of your medicine to see whether this is safe to do. Throwing them out in the trash. Check the label or package insert of your medicine to see whether this is safe to do. If it is safe to throw it out, remove the medicine from the original container, put it into a sealable bag or container, and  mix it with used coffee grounds, food scraps, dirt, or cat litter before putting it in the trash. Follow these instructions at home: Activity Do exercises as told by your health care provider. Avoid activities that make your pain worse. Return to your normal activities as told by your health care provider. Ask your health care provider what activities are safe for you. General instructions You may need to take these actions to prevent or treat constipation: Drink enough fluid to keep your urine pale yellow. Take over-the-counter or prescription medicines. Eat foods that are high in fiber, such as beans, whole grains, and fresh fruits and vegetables. Limit foods that are high in fat and processed sugars, such as fried or sweet foods. Keep all follow-up visits. This is important. Where to find support If you have been taking opioids for a long time, you may benefit from receiving support for quitting from a local support group or counselor. Ask your health care provider for a referral to these resources in your area. Where to find more information Centers for Disease Control and Prevention (CDC): http://www.wolf.info/ [place]  Food and Drug Administration (FDA): [place] Get help right away if: You may have taken too much of an opioid (overdosed). Common symptoms of an overdose: Your breathing is slower or more shallow than normal. You have a very slow heartbeat (pulse). You have slurred speech. You have nausea and vomiting. Your pupils become very small. You have other potential symptoms: You are very confused. You faint or feel like you will faint. You have cold, clammy skin. You have blue lips or fingernails. You have thoughts of harming yourself or harming others. These symptoms may represent a serious problem that is an emergency. Do not wait to see if the symptoms will go away. Get medical help right away. Call your local emergency services (911 in the [place] Do not drive yourself to the  hospital.  If you ever feel like you may hurt yourself or others, or have thoughts about taking your own life, get help right away. Go to your nearest emergency department or: Call your local emergency services (911 in the [place] Call the [facility] 2015655192 in the [place] Call a suicide crisis helpline, such as the [place] at (773) 697-1087 or 988 in the [place]. This is open 24 hours a day in the [place] Text the Crisis Text Line at 9715621029 (in the [place].). Summary Opioid medicines can help you manage moderate to severe pain for a short period of time. A pain treatment plan is an agreement between you and your health care provider. Discuss the goals of your treatment, including how much pain you might expect to have and how you will manage the pain. If you think that you or someone else may have taken too much of an opioid, get medical help right away. This information is not intended to replace advice given to you by your health care provider. Make sure you discuss any questions you have with your health care provider. Document Revised: 08/17/2020 Document Reviewed: 05/04/2020 Elsevier Patient Education  [place].

## 2021-08-23 NOTE — Progress Notes (Signed)
Subjective:   Maria Keller is a 71 y.o. female who presents for Medicare Annual (Subsequent) preventive examination.  Review of Systems    No ROS.  Medicare Wellness Virtual Visit.  Visual/audio telehealth visit, UTA vital signs.   See social history for additional risk factors.   Cardiac Risk Factors include: advanced age (>47mn, >>22women)     Objective:    Today's Vitals   08/23/21 0836  Weight: 175 lb (79.4 kg)  Height: '5\' 7"'$  (1.702 m)   Body mass index is 27.41 kg/m.     08/23/2021    8:39 AM 08/22/2020   11:26 AM 08/20/2019    9:42 AM 08/19/2018   10:14 AM 06/19/2017    8:50 AM 04/29/2017   10:11 AM 04/26/2016   10:18 AM  Advanced Directives  Does Patient Have a Medical Advance Directive? Yes Yes Yes Yes Yes Yes Yes  Type of AParamedicof AGreenLiving will HWest Long BranchLiving will HColeraineLiving will HZebulonLiving will  HPine RidgeLiving will HWalkerLiving will  Does patient want to make changes to medical advance directive? No - Patient declined No - Patient declined No - Patient declined No - Patient declined  No - Patient declined No - Patient declined  Copy of H[place] Cityin Chart? No - copy requested No - copy requested No - copy requested No - copy requested  No - copy requested No - copy requested    Current Medications (verified) Outpatient Encounter Medications as of 08/23/2021  Medication Sig   aspirin EC 81 MG tablet Take by mouth.   atorvastatin (LIPITOR) 10 MG tablet Take 1 tablet (10 mg total) by mouth daily.   azelastine (ASTELIN) 0.1 % nasal spray 1 SPRAY IN EACH NOSTRIL TWICE DAILY AS DIRECTED   calcium carbonate (TUMS - DOSED IN MG ELEMENTAL CALCIUM) 500 MG chewable tablet Chew 1 tablet by mouth as needed.   cetirizine (ZYRTEC) 10 MG tablet Take by mouth.   Diphenhyd-Hydrocort-Nystatin (FIRST-DUKES MOUTHWASH)  SUSP Swish and spit 5 mLs tid prn   estradiol (ESTRACE) 0.5 MG tablet TAKE 1 TABLET BY MOUTH DAILY.   fentaNYL (DURAGESIC) 100 MCG/HR Apply 1 patch to skin every 2 days with fentanyl patch 25 g per hour if tolerated   fentaNYL (DURAGESIC) 25 MCG/HR patch Apply 1 patch to skin every 2 days with fentanyl patch 100 g per hour if tolerated   fentaNYL (DURAGESIC) 50 MCG/HR SMARTSIG:1 Patch(s) Topical As Directed   fluconazole (DIFLUCAN) 150 MG tablet Take 1 tablet x 1.  If persistent symptoms, may repeat x 1 in 3 days.   fluticasone (FLONASE) 50 MCG/ACT nasal spray Place 2 sprays into both nostrils daily.   furosemide (LASIX) 20 MG tablet TAKE ONE TABLET DAILY AS NEEDED   lidocaine (LIDODERM) 5 % SMARTSIG:Topical   mupirocin ointment (BACTROBAN) 2 % Apply to affected area bid.   nitrofurantoin, macrocrystal-monohydrate, (MACROBID) 100 MG capsule Take 1 capsule (100 mg total) by mouth 2 (two) times daily.   omeprazole (PRILOSEC) 20 MG capsule TAKE 1 CAPSULE BY MOUTH TWICE DAILY   oxyCODONE (ROXICODONE) 5 MG immediate release tablet Limit 1 tab by mouth per day or twice per day if tolerated for breakthrough pain while wearing fentanyl patches   senna-docusate (SENOKOT-S) 8.6-50 MG per tablet Take by mouth.   No facility-administered encounter medications on file as of 08/23/2021.    Allergies (verified) Oxycodone hcl, Oxycontin [oxycodone  hcl], Penicillins, Chloral hydrate, Ciprofloxacin, Ciprofloxacin hcl, Colace [docusate calcium], Methotrexate derivatives, Parafon forte dsc [chlorzoxazone], Penicillin g, Tape, Bextra [valdecoxib], Oxycodone, and Rofecoxib   History: Past Medical History:  Diagnosis Date   Anemia    iron deficiency and B12 deficiency   Arthritis    Bronchitis 08/2015   Chronic back pain    followed by Dr Primus Bravo   Depression    Diverticulosis    Esophageal stricture    requiring dilatation x 2   Fibromyalgia    Fibromyalgia    Fibromyalgia    Follicular lymphoma ([place])     Followed by Dr Leretha Pol, s/p chemo and XRT   Follicular lymphoma ([place])    GERD (gastroesophageal reflux disease)    GERD (gastroesophageal reflux disease)    Hemorrhoid    Hiatal hernia    History of kidney stones    Hypercholesterolemia    IBS (irritable bowel syndrome)    IBS (irritable bowel syndrome)    Nephrolithiasis    followed by Dr Bernardo Heater, s/p stents   PONV (postoperative nausea and vomiting)    Shingles outbreak 11/26/2011   Sleep apnea    Past Surgical History:  Procedure Laterality Date   APPENDECTOMY     BLADDER SURGERY  1993   BREAST BIOPSY N/A    capsule endoscopy     CHOLECYSTECTOMY  1995   COLONOSCOPY WITH PROPOFOL N/A 06/19/2017   Procedure: COLONOSCOPY WITH PROPOFOL;  Surgeon: Manya Silvas, MD;  Location: [facility] ENDOSCOPY;  Service: Endoscopy;  Laterality: N/A;   ESOPHAGOGASTRODUODENOSCOPY (EGD) WITH PROPOFOL N/A 06/19/2017   Procedure: ESOPHAGOGASTRODUODENOSCOPY (EGD) WITH PROPOFOL;  Surgeon: Manya Silvas, MD;  Location: [facility] ENDOSCOPY;  Service: Endoscopy;  Laterality: N/A;   FRACTURE SURGERY     history of helicobacter pylori infection     JOINT REPLACEMENT     LEFT OOPHORECTOMY     LEG SURGERY Left    ORIF ANKLE FRACTURE  2012   OVARIAN CYST REMOVAL     right   RECTOCELE REPAIR N/A    REPLACEMENT TOTAL KNEE Right 03/19/2016   TONSILECTOMY/ADENOIDECTOMY WITH MYRINGOTOMY  1970   TONSILLECTOMY N/A    TUBAL LIGATION  1976   VAGINAL HYSTERECTOMY  1980's   secondary to bleeding   Family History  Problem Relation Age of Onset   Ulcers Mother    Ulcers Father    Breast cancer Other        great aunt and grandfather   Lung cancer Other        uncle   Hypertension Maternal Grandmother    Heart disease Maternal Grandmother        MI 44s   Diabetes Maternal Grandmother    Arthritis Maternal Grandmother    Diabetes Maternal Grandfather    Social History   Socioeconomic History   Marital status: Married    Spouse name: Not on file    Number of children: Not on file   Years of education: Not on file   Highest education level: Not on file  Occupational History   Not on file  Tobacco Use   Smoking status: Never   Smokeless tobacco: Never  Vaping Use   Vaping Use: Never used  Substance and Sexual Activity   Alcohol use: No    Alcohol/week: 0.0 standard drinks of alcohol   Drug use: No   Sexual activity: Yes  Other Topics Concern   Not on file  Social History Narrative   Not on file   Social Determinants  of Health   Financial Resource Strain: Low Risk  (08/23/2021)   Overall Financial Resource Strain (CARDIA)    Difficulty of Paying Living Expenses: Not hard at all  Food Insecurity: No Food Insecurity (08/23/2021)   Hunger Vital Sign    Worried About Running Out of Food in the Last Year: Never true    Ran Out of Food in the Last Year: Never true  Transportation Needs: No Transportation Needs (08/23/2021)   PRAPARE - Hydrologist (Medical): No    Lack of Transportation (Non-Medical): No  Physical Activity: Insufficiently Active (08/19/2018)   Exercise Vital Sign    Days of Exercise per Week: 1 day    Minutes of Exercise per Session: 30 min  Stress: No Stress Concern Present (08/23/2021)   [place]    Feeling of Stress : Not at all  Social Connections: [place] (08/23/2021)   Social Connection and Isolation Panel [NHANES]    Frequency of Communication with Friends and Family: More than three times a week    Frequency of Social Gatherings with Friends and Family: More than three times a week    Attends Religious Services: More than 4 times per year    Active Member of [employer] or Organizations: Not on file    Attends Music therapist: More than 4 times per year    Marital Status: Married    Tobacco Counseling Counseling given: Not Answered   Clinical Intake:  Pre-visit preparation  completed: Yes        Diabetes: No  How often do you need to have someone help you when you read instructions, pamphlets, or other written materials from your doctor or pharmacy?: 1 - Never  Interpreter Needed?: No    Activities of Daily Living    08/23/2021    8:38 AM  In your present state of health, do you have any difficulty performing the following activities:  Hearing? 0  Vision? 0  Difficulty concentrating or making decisions? 0  Walking or climbing stairs? 1  Dressing or bathing? 0  Doing errands, shopping? 0  Preparing Food and eating ? N  Using the Toilet? N  In the past six months, have you accidently leaked urine? N  Do you have problems with loss of bowel control? N  Managing your Medications? N  Managing your Finances? N  Housekeeping or managing your Housekeeping? N    Patient Care Team: Einar Pheasant, MD as PCP - General (Internal Medicine)  Indicate any recent Medical Services you may have received from other than [facility] providers in the past year (date may be approximate).     Assessment:   This is a routine wellness examination for [place].  Virtual Visit via Telephone Note  I connected with  Maria Keller on 08/23/21 at  8:30 AM EDT by telephone and verified that I am speaking with the correct person using two identifiers.  Persons participating in the virtual visit: patient/Nurse Health Advisor   I discussed the limitations of performing an evaluation and management service by telehealth. We continued and completed visit with audio only. Some vital signs may be absent or patient reported.   Hearing/Vision screen Hearing Screening - Comments:: Patient is able to hear conversational tones without difficulty. No issues reported. Vision Screening - Comments:: Followed by [facility] Wears glasses Cataract extraction, bilateral  They have seen their ophthalmologist in the last 12 months.   Dietary  issues and exercise activities  discussed: Current Exercise Habits: The patient does not participate in regular exercise at present   Goals Addressed               This Visit's Progress     Patient Stated     Maintain Healthy Lifestyle (pt-stated)        Stay active. Healthy diet.        Depression Screen    08/23/2021    8:38 AM 07/06/2021    7:34 AM 08/22/2020   11:23 AM 07/05/2020   10:04 AM 08/20/2019    9:50 AM 06/30/2019    8:57 AM 08/19/2018   10:14 AM  PHQ 2/9 Scores  PHQ - 2 Score 0 0 0 0 0 0 0    Fall Risk    08/23/2021    8:38 AM 07/06/2021    7:34 AM 08/22/2020   11:28 AM 07/05/2020   10:04 AM 06/30/2019    8:57 AM  Fall Risk   Falls in the past year? 0 0 0 0 0  Number falls in past yr:     0  Injury with Fall?     0  Risk for fall due to :  No Fall Risks     Follow up Falls evaluation completed Falls evaluation completed Falls evaluation completed Falls evaluation completed Falls evaluation completed    [place]: Home free of loose throw rugs in walkways, pet beds, electrical cords, etc? Yes  Adequate lighting in your home to reduce risk of falls? Yes   ASSISTIVE DEVICES UTILIZED TO PREVENT FALLS: Life alert? No  Use of a cane, walker or w/c? Yes  Grab bars in the bathroom? Yes  Shower chair or bench in shower? No  Comfort chair height toilet ? Yes   TIMED UP AND GO: Was the test performed? No .   Cognitive Function: Patient is alert and oriented x3.  Enjoys playing some brain games for stimulation.      04/29/2017   10:47 AM 04/26/2016   10:53 AM 04/27/2015   11:24 AM  MMSE - Mini Mental State Exam  Orientation to time '5 5 5  '$ Orientation to Place '5 5 5  '$ Registration '3 3 3  '$ Attention/ Calculation '5 5 5  '$ Recall '3 3 3  '$ Language- name 2 objects '2 2 2  '$ Language- repeat '1 1 1  '$ Language- follow 3 step command '3 3 3  '$ Language- read & follow direction '1 1 1  '$ Write a sentence '1 1 1  '$ Copy design '1 1 1  '$ Total score '30 30 30        '$ 08/20/2019     9:50 AM 08/19/2018   10:17 AM  6CIT Screen  What Year?  0 points  What month?  0 points  What time?  0 points  Count back from 20  0 points  Months in reverse 0 points 0 points  Repeat phrase  0 points  Total Score  0 points    Immunizations Immunization History  Administered Date(s) Administered   Fluad Quad(high Dose 65+) 10/28/2018, 11/12/2019   Influenza Split 02/25/2012   Influenza, High Dose Seasonal PF 11/17/2015, 01/07/2017, 02/01/2018   Influenza,inj,Quad PF,6+ Mos 02/22/2014, 10/27/2014   Influenza-Unspecified 02/29/2012, 10/28/2012, 02/22/2014, 10/27/2014, 11/17/2015   Moderna Sars-Covid-2 Vaccination 03/20/2019, 04/20/2019   Pneumococcal Conjugate-13 03/02/2015   Pneumococcal Polysaccharide-23 01/07/2017   Tdap 10/16/2018   Zoster Recombinat (Shingrix) 10/16/2017, 12/24/2017   Zoster, Live 11/06/2005  Screening Tests Health Maintenance  Topic Date Due   COVID-19 Vaccine (3 - Moderna risk series) 09/08/2021 (Originally 05/18/2019)   INFLUENZA VACCINE  09/05/2021   COLONOSCOPY (Pts 45-37yr Insurance coverage will need to be confirmed)  06/20/2022   MAMMOGRAM  06/03/2023   TETANUS/TDAP  10/15/2028   Pneumonia Vaccine 71 Years old  Completed   DEXA SCAN  Completed   Hepatitis C Screening  Completed   Zoster Vaccines- Shingrix  Completed   HPV VACCINES  Aged Out   Health Maintenance There are no preventive care reminders to display for this patient.  Lung Cancer Screening: (Low Dose CT Chest recommended if Age 73-80years, 30 pack-year currently smoking OR have quit w/in 15years.) does not qualify.   Vision Screening: Recommended annual ophthalmology exams for early detection of glaucoma and other disorders of the eye.  Dental Screening: Recommended annual dental exams for proper oral hygiene  Community Resource Referral / Chronic Care Management: CRR required this visit?  No   CCM required this visit?  No      Plan:   Keep all routine maintenance  appointments.   I have personally reviewed and noted the following in the patient's chart:   Medical and social history Use of alcohol, tobacco or illicit drugs  Current medications and supplements including opioid prescriptions. Taking opioids. [facility] GMohammed KindleFunctional ability and status Nutritional status Physical activity Advanced directives List of other physicians Hospitalizations, surgeries, and ER visits in previous 12 months Vitals Screenings to include cognitive, depression, and falls Referrals and appointments  In addition, I have reviewed and discussed with patient certain preventive protocols, quality metrics, and best practice recommendations. A written personalized care plan for preventive services as well as general preventive health recommendations were provided to patient.     OVarney Biles LPN   [id number]

## 2021-09-06 ENCOUNTER — Other Ambulatory Visit: Payer: Medicare Other

## 2021-09-12 ENCOUNTER — Ambulatory Visit
Admission: RE | Admit: 2021-09-12 | Discharge: 2021-09-12 | Disposition: A | Discharge status: Home / Self Care | Payer: Medicare Other | Source: Ambulatory Visit | Attending: Internal Medicine | Admitting: Internal Medicine

## 2021-09-12 ENCOUNTER — Telehealth: Payer: Self-pay | Admitting: Internal Medicine

## 2021-09-12 ENCOUNTER — Other Ambulatory Visit: Payer: Medicare Other

## 2021-09-12 DIAGNOSIS — M542 Cervicalgia: Secondary | ICD-10-CM | POA: Diagnosis not present

## 2021-09-12 DIAGNOSIS — E2839 Other primary ovarian failure: Secondary | ICD-10-CM | POA: Insufficient documentation

## 2021-09-12 DIAGNOSIS — M7989 Other specified soft tissue disorders: Secondary | ICD-10-CM

## 2021-09-12 DIAGNOSIS — M5136 Other intervertebral disc degeneration, lumbar region: Secondary | ICD-10-CM | POA: Diagnosis not present

## 2021-09-12 DIAGNOSIS — G894 Chronic pain syndrome: Secondary | ICD-10-CM | POA: Diagnosis not present

## 2021-09-12 DIAGNOSIS — M85851 Other specified disorders of bone density and structure, right thigh: Secondary | ICD-10-CM | POA: Diagnosis not present

## 2021-09-12 DIAGNOSIS — M5030 Other cervical disc degeneration, unspecified cervical region: Secondary | ICD-10-CM | POA: Diagnosis not present

## 2021-09-12 NOTE — Telephone Encounter (Signed)
Pt called stating she is having knee surgery and Dr. Primus Bravo want the provider to refer her to a lymphatic doctor. Pt would like to be called

## 2021-09-13 NOTE — Telephone Encounter (Signed)
S/w pt - stated Dr Primus Bravo wants pt referred to a lymphatic dr? for swelling in R leg prior to knee surgery. Thinks this will be helpful for any issues due to swelling during/after surgery.  Ok to place referral ? Pt advised may need to be seen here first, agreeable.  Please advise.

## 2021-09-13 NOTE — Telephone Encounter (Signed)
Order placed for referral to [facility] °

## 2021-09-13 NOTE — Telephone Encounter (Signed)
Pt advised - agreeable

## 2021-09-13 NOTE — Telephone Encounter (Signed)
I am ok to place referral.  Her pain medicine MD has evaluated and recommended.  I can place order to  vascular and vein - is where I would start if agreeable.

## 2021-10-11 DIAGNOSIS — M5136 Other intervertebral disc degeneration, lumbar region: Secondary | ICD-10-CM | POA: Diagnosis not present

## 2021-10-11 DIAGNOSIS — Z79891 Long term (current) use of opiate analgesic: Secondary | ICD-10-CM | POA: Insufficient documentation

## 2021-10-11 DIAGNOSIS — G894 Chronic pain syndrome: Secondary | ICD-10-CM | POA: Insufficient documentation

## 2021-10-11 DIAGNOSIS — M5030 Other cervical disc degeneration, unspecified cervical region: Secondary | ICD-10-CM | POA: Diagnosis not present

## 2021-10-11 DIAGNOSIS — M899 Disorder of bone, unspecified: Secondary | ICD-10-CM | POA: Insufficient documentation

## 2021-10-11 DIAGNOSIS — Z789 Other specified health status: Secondary | ICD-10-CM | POA: Insufficient documentation

## 2021-10-11 DIAGNOSIS — M259 Joint disorder, unspecified: Secondary | ICD-10-CM | POA: Diagnosis not present

## 2021-10-11 DIAGNOSIS — K5790 Diverticulosis of intestine, part unspecified, without perforation or abscess without bleeding: Secondary | ICD-10-CM | POA: Insufficient documentation

## 2021-10-11 DIAGNOSIS — Z79899 Other long term (current) drug therapy: Secondary | ICD-10-CM | POA: Insufficient documentation

## 2021-10-11 DIAGNOSIS — F1190 Opioid use, unspecified, uncomplicated: Secondary | ICD-10-CM | POA: Insufficient documentation

## 2021-10-11 DIAGNOSIS — T50905S Adverse effect of unspecified drugs, medicaments and biological substances, sequela: Secondary | ICD-10-CM | POA: Insufficient documentation

## 2021-10-11 DIAGNOSIS — M542 Cervicalgia: Secondary | ICD-10-CM | POA: Diagnosis not present

## 2021-10-11 NOTE — Patient Instructions (Addendum)
____________________________________________________________________________________________  Drug Holidays (Slow)  What is a "Drug Holiday"? Drug Holiday: is the name given to the period of time during which a patient stops taking a medication(s) for the purpose of eliminating tolerance to the drug.  Benefits Improved effectiveness of opioids. Decreased opioid dose needed to achieve benefits. Improved pain with lesser dose.  What is tolerance? Tolerance: is the progressive decreased in effectiveness of a drug due to its repetitive use. With repetitive use, the body gets use to the medication and as a consequence, it loses its effectiveness. This is a common problem seen with opioid pain medications. As a result, a larger dose of the drug is needed to achieve the same effect that used to be obtained with a smaller dose.  How long should a "Drug Holiday" last? You should stay off of the pain medicine for at least 14 consecutive days. (2 weeks)  Should I stop the medicine "cold [place]"? No. You should always coordinate with your Pain Specialist so that he/she can provide you with the correct medication dose to make the transition as smoothly as possible.  How do I stop the medicine? Slowly. You will be instructed to decrease the daily amount of pills that you take by one (1) pill every seven (7) days. This is called a "slow downward taper" of your dose. For example: if you normally take four (4) pills per day, you will be asked to drop this dose to three (3) pills per day for seven (7) days, then to two (2) pills per day for seven (7) days, then to one (1) per day for seven (7) days, and at the end of those last seven (7) days, this is when the "Drug Holiday" would start.   Will I have withdrawals? By doing a "slow downward taper" like this one, it is unlikely that you will experience any significant withdrawal symptoms. Typically, what triggers withdrawals is the sudden stop of a high dose  opioid therapy. Withdrawals can usually be avoided by slowly decreasing the dose over a prolonged period of time. If you do not follow these instructions and decide to stop your medication abruptly, withdrawals may be possible.  What are withdrawals? Withdrawals: refers to the wide range of symptoms that occur after stopping or dramatically reducing opiate drugs after heavy and prolonged use. Withdrawal symptoms do not occur to patients that use low dose opioids, or those who take the medication sporadically. Contrary to benzodiazepine (example: Valium, Xanax, etc.) or alcohol withdrawals ("Delirium Tremens"), opioid withdrawals are not lethal. Withdrawals are the physical manifestation of the body getting rid of the excess receptors.  Expected Symptoms Early symptoms of withdrawal may include: Agitation Anxiety Muscle aches Increased tearing Insomnia Runny nose Sweating Yawning  Late symptoms of withdrawal may include: Abdominal cramping Diarrhea Dilated pupils Goose bumps Nausea Vomiting  Will I experience withdrawals? Due to the slow nature of the taper, it is very unlikely that you will experience any.  What is a slow taper? Taper: refers to the gradual decrease in dose.  (Last update: 08/26/2019) ____________________________________________________________________________________________  ____________________________________________________________________________________________  Pharmacy Shortages of Pain Medication   Introduction Shockingly as it may seem, .  "No U.S. Supreme Court decision has ever interpreted the Constitution as guaranteeing a right to health care for all Americans." - https://huff.com/  "With respect to human rights, the [place] has no formally codified right to health, nor does it participate in a human rights treaty that specifies a right to health." - Scott J. Schweikart,  JD, MBE  Situation By now, most of our patients have had the experience of being told by their pharmacist that they do not have enough medication to cover their prescription. If you have not had this experience, just know that you soon will.  Problem There appears to be a shortage of these medications, either at the national level or locally. This is happening with all pharmacies. When there is not enough medication, patients are offered a partial fill and they are told that they will try to get the rest of the medicine for them at a later time. If they do not have enough for even a partial fill, the pharmacists are telling the patients to call us (the prescribing physicians) to request that we send another prescription to another pharmacy to get the medicine.   This reordering of a controlled substance creates documentation problems where additional paperwork needs to be created to explain why two prescriptions for the same period of time and the same medicine are being prescribed to the same patient. It also creates situations where the last appointment note does not accurately reflect when and what prescriptions were given to a patient. This leads to prescribing errors down the line, in subsequent follow-up visits.   [employer] of Pharmacy ([employer]) Research revealed that Surveyor, quantity .1806 (21 NCAC 46.1806) authorizes pharmacists to the transfer of prescriptions among pharmacies, and it sets forth procedural and recordkeeping requirements for doing so. However, this requires the pharmacist to complete the previously mentioned procedural paperwork to accomplish the transfer. As it turns out, it is much easier for them to have the prescribing physicians do the work.   Possible solutions 1. You can ask your physician to assist you in weaning yourself off these medications. 2. Ask your pharmacy if the medication is in stock, 3 days prior to your refill. 3. If you need a pharmacy  change, let us know at your medication management visit. Prescriptions that have already been electronically sent to a pharmacy will not be re-sent to a different pharmacy if your pharmacy of record does not have it in stock. Proper stocking of medication is a pharmacy problem, not a prescriber problem. Work with your pharmacist to solve the problem. 4. Have the [employer] add a provision to the "STOP ACT" (the law that mandates how controlled substances are prescribed) where there is an exception to the electronic prescribing rule that states that in the event there are shortages of medications the physicians are allowed to use written prescriptions as opposed to electronic ones. This would allow patients to take their prescriptions to a different pharmacy that may have enough medication available to fill the prescription. The problem is that currently there is a law that does not allow for written prescriptions, with the exception of instances where the electronic medical record is down due to technical issues.  5. Have [place] Congress ease the pressure on pharmaceutical companies, allowing them to produce enough quantities of the medication to adequately supply the population. 6. Have pharmacies keep enough stocks of these medications to cover their client base.  7. Have the [facility] Assembly add a provision to the "STOP ACT" where they ease the regulations surrounding the transfer of controlled substances between pharmacies, so as to simplify the transfer of supplies. As an alternative, develop a system to allow patients to obtain the remainder of their prescription at another one of their pharmacies or at an associate pharmacy.  How this shortage will affect you.  Understand that this is a pharmacy supply problem, not a prescriber problem. Work with your pharmacy to solve it. The job of the prescriber is to evaluate and monitor the patient for the appropriate indications and  use of these medicines. It is not the job of the prescriber to supply the medication or to solve problems with that supply. The responsibility and the choice to obtain the medication resides on the patient. By law, supplying the medication is the job of the pharmacy. It is certainly not the job of the prescriber to solve supply problems.   Due to the above problems we are no longer taking patients to write for their pain medication. Future discussions with your physician may include potentially weaning medications or transitioning to alternatives.  We will be focusing primarily on interventional based pain management. We will continue to evaluate for appropriate indications and we may provide recommendations regarding medication, dose, and schedule, as well as monitoring recommendations, however, we will not be taking over the actual prescribing of these substances. On those patients where we are treating their chronic pain with interventional therapies, exceptions will be considered on a case by case basis. At this time, we will try to continue providing this supplemental service to those patients we have been managing in the past. However, as of August 1st, 2023, we no longer will be sending additional prescriptions to other pharmacies for the purpose of solving their supply problems. Once we send a prescription to a pharmacy, we will not be resending it again to another pharmacy to cover for their shortages.   What to do. Write as many letters as you can. Recruit the help of family members in writing these letters. Below are some of the places where you can write to make your voice heard. Let them know what the problem is and push them to look for solutions.   Search internet for: "[employer] find your legislators" NoseSwap.is  Search internet for: "[employer] commissioner  complaints" Starlas.fi  Search internet for: "[place] complaints" https://www.hernandez-brewer.com/.htm  Search internet for: "[facility] pharmacy complaints" Email [facility] Customer Relations woondaal.com.jsp?callType=store  Search internet for: Programme researcher, broadcasting/film/video customer service complaints" https://www.[facility].com/topic/marketing/contactus/contactus_customerservice.jsp  ____________________________________________________________________________________________  ____________________________________________________________________________________________  Medication Rules  Purpose: To inform patients, and their family members, of our rules and regulations.  Applies to: All patients receiving prescriptions (written or electronic).  Pharmacy of record: Pharmacy where electronic prescriptions will be sent. If written prescriptions are taken to a different pharmacy, please inform the nursing staff. The pharmacy listed in the electronic medical record should be the one where you would like electronic prescriptions to be sent.  Electronic prescriptions: In compliance with the [place] (STOP) Act of 2017 (Session Lanny Cramp [phone]), effective February 05, 2018, all controlled substances must be electronically prescribed. Calling prescriptions to the pharmacy will cease to exist.  Prescription refills: Only during scheduled appointments. Applies to all prescriptions.  NOTE: The following applies primarily to controlled substances (Opioid* Pain Medications).   Type of encounter (visit): For patients receiving controlled substances, face-to-face visits are required. (Not an option or up to the patient.)  Patient's responsibilities: Pain Pills: Bring all pain pills to every appointment (except for procedure appointments). Pill Bottles:  Bring pills in original pharmacy bottle. Always bring the newest bottle. Bring bottle, even if empty. Medication refills: You are responsible for knowing and keeping track of what medications you take and those you  need refilled. The day before your appointment: write a list of all prescriptions that need to be refilled. The day of the appointment: give the list to the admitting nurse. Prescriptions will be written only during appointments. No prescriptions will be written on procedure days. If you forget a medication: it will not be "Called in", "Faxed", or "electronically sent". You will need to get another appointment to get these prescribed. No early refills. Do not call asking to have your prescription filled early. Prescription Accuracy: You are responsible for carefully inspecting your prescriptions before leaving our office. Have the discharge nurse carefully go over each prescription with you, before taking them home. Make sure that your name is accurately spelled, that your address is correct. Check the name and dose of your medication to make sure it is accurate. Check the number of pills, and the written instructions to make sure they are clear and accurate. Make sure that you are given enough medication to last until your next medication refill appointment. Taking Medication: Take medication as prescribed. When it comes to controlled substances, taking less pills or less frequently than prescribed is permitted and encouraged. Never take more pills than instructed. Never take medication more frequently than prescribed.  Inform other Doctors: Always inform, all of your healthcare providers, of all the medications you take. Pain Medication from other Providers: You are not allowed to accept any additional pain medication from any other Doctor or Healthcare provider. There are two exceptions to this rule. (see below) In the event that you require additional pain medication, you are responsible  for notifying us, as stated below. Cough Medicine: Often these contain an opioid, such as codeine or hydrocodone. Never accept or take cough medicine containing these opioids if you are already taking an opioid* medication. The combination may cause respiratory failure and death. Medication Agreement: You are responsible for carefully reading and following our Medication Agreement. This must be signed before receiving any prescriptions from our practice. Safely store a copy of your signed Agreement. Violations to the Agreement will result in no further prescriptions. (Additional copies of our Medication Agreement are available upon request.) Laws, Rules, & Regulations: All patients are expected to follow all Federal and [employer], [employer], Rules, [employer]. Ignorance of the Laws does not constitute a valid excuse.  Illegal drugs and Controlled Substances: The use of illegal substances (including, but not limited to marijuana and its derivatives) and/or the illegal use of any controlled substances is strictly prohibited. Violation of this rule may result in the immediate and permanent discontinuation of any and all prescriptions being written by our practice. The use of any illegal substances is prohibited. Adopted CDC guidelines & recommendations: Target dosing levels will be at or below 60 MME/day. Use of benzodiazepines** is not recommended.  Exceptions: There are only two exceptions to the rule of not receiving pain medications from other Healthcare Providers. Exception #1 (Emergencies): In the event of an emergency (i.e.: accident requiring emergency care), you are allowed to receive additional pain medication. However, you are responsible for: As soon as you are able, call our office [phone], at any time of the day or night, and leave a message stating your name, the date and nature of the emergency, and the name and dose of the medication prescribed. In the event that your call is answered  by a member of our staff, make sure to document and save the date, time, and the name of the person that took your information.  Exception #2 (Planned Surgery): In the event that you are scheduled by another doctor or dentist to have any type of surgery or procedure, you are allowed (for a period no longer than 30 days), to receive additional pain medication, for the acute post-op pain. However, in this case, you are responsible for picking up a copy of our "Post-op Pain Management for Surgeons" handout, and giving it to your surgeon or dentist. This document is available at our office, and does not require an appointment to obtain it. Simply go to our office during business hours (Monday-Thursday from 8:00 AM to 4:00 PM) (Friday 8:00 AM to 12:00 Noon) or if you have a scheduled appointment with [place], prior to your surgery, and ask for it by name. In addition, you are responsible for: calling our office [phone], at any time of the day or night, and leaving a message stating your name, name of your surgeon, type of surgery, and date of procedure or surgery. Failure to comply with your responsibilities may result in termination of therapy involving the controlled substances. Medication Agreement Violation. Following the above rules, including your responsibilities will help you in avoiding a Medication Agreement Violation ("Breaking your Pain Medication Contract").  *Opioid medications include: morphine, codeine, oxycodone, oxymorphone, hydrocodone, hydromorphone, meperidine, tramadol, tapentadol, buprenorphine, fentanyl, methadone. **Benzodiazepine medications include: diazepam (Valium), alprazolam (Xanax), clonazepam (Klonopine), lorazepam (Ativan), clorazepate (Tranxene), chlordiazepoxide (Librium), estazolam (Prosom), oxazepam (Serax), temazepam (Restoril), triazolam (Halcion) (Last updated: 11/02/2020) ____________________________________________________________________________________________   ____________________________________________________________________________________________  Medication Recommendations and Reminders  Applies to: All patients receiving prescriptions (written and/or electronic).  Medication Rules & Regulations: These rules and regulations exist for your safety and that of others. They are not flexible and neither are we. Dismissing or ignoring them will be considered "non-compliance" with medication therapy, resulting in complete and irreversible termination of such therapy. (See document titled "Medication Rules" for more details.) In all conscience, because of safety reasons, we cannot continue providing a therapy where the patient does not follow instructions.  Pharmacy of record:  Definition: This is the pharmacy where your electronic prescriptions will be sent.  We do not endorse any particular pharmacy, however, we have experienced problems with [facility] not securing enough medication supply for the community. We do not restrict you in your choice of pharmacy. However, once we write for your prescriptions, we will NOT be re-sending more prescriptions to fix restricted supply problems created by your pharmacy, or your insurance.  The pharmacy listed in the electronic medical record should be the one where you want electronic prescriptions to be sent. If you choose to change pharmacy, simply notify our nursing staff.  Recommendations: Keep all of your pain medications in a safe place, under lock and key, even if you live alone. We will NOT replace lost, stolen, or damaged medication. After you fill your prescription, take 1 week's worth of pills and put them away in a safe place. You should keep a separate, properly labeled bottle for this purpose. The remainder should be kept in the original bottle. Use this as your primary supply, until it runs out. Once it's gone, then you know that you have 1 week's worth of medicine, and it is time to come in for a  prescription refill. If you do this correctly, it is unlikely that you will ever run out of medicine. To make sure that the above recommendation works, it is very important that you make sure your medication refill appointments are scheduled at least 1 week before you run  out of medicine. To do this in an effective manner, make sure that you do not leave the office without scheduling your next medication management appointment. Always ask the nursing staff to show you in your prescription , when your medication will be running out. Then arrange for the receptionist to get you a return appointment, at least 7 days before you run out of medicine. Do not wait until you have 1 or 2 pills left, to come in. This is very poor planning and does not take into consideration that we may need to cancel appointments due to bad weather, sickness, or emergencies affecting our staff. DO NOT ACCEPT A "Partial Fill": If for any reason your pharmacy does not have enough pills/tablets to completely fill or refill your prescription, do not allow for a "partial fill". The law allows the pharmacy to complete that prescription within 72 hours, without requiring a new prescription. If they do not fill the rest of your prescription within those 72 hours, you will need a separate prescription to fill the remaining amount, which we will NOT provide. If the reason for the partial fill is your insurance, you will need to talk to the pharmacist about payment alternatives for the remaining tablets, but again, DO NOT ACCEPT A PARTIAL FILL, unless you can trust your pharmacist to obtain the remainder of the pills within 72 hours.  Prescription refills and/or changes in medication(s):  Prescription refills, and/or changes in dose or medication, will be conducted only during scheduled medication management appointments. (Applies to both, written and electronic prescriptions.) No refills on procedure days. No medication will be changed or started  on procedure days. No changes, adjustments, and/or refills will be conducted on a procedure day. Doing so will interfere with the diagnostic portion of the procedure. No phone refills. No medications will be "called into the pharmacy". No Fax refills. No weekend refills. No Holliday refills. No after hours refills.  Remember:  Business hours are:  Monday to Thursday 8:00 AM to 4:00 PM Provider's Schedule: Milinda Pointer, MD - Appointments are:  Medication management: Monday and Wednesday 8:00 AM to 4:00 PM Procedure day: Tuesday and Thursday 7:30 AM to 4:00 PM Gillis Santa, MD - Appointments are:  Medication management: Tuesday and Thursday 8:00 AM to 4:00 PM Procedure day: Monday and Wednesday 7:30 AM to 4:00 PM (Last update: 08/26/2019) ____________________________________________________________________________________________  ____________________________________________________________________________________________  CBD (cannabidiol) & Delta-8 (Delta-8 tetrahydrocannabinol) WARNING  Intro: Cannabidiol (CBD) and tetrahydrocannabinol (THC), are two natural compounds found in plants of the Cannabis genus. They can both be extracted from hemp or cannabis. Hemp and cannabis come from the Cannabis sativa plant. Both compounds interact with your body's endocannabinoid system, but they have very different effects. CBD does not produce the high sensation associated with cannabis. Delta-8 tetrahydrocannabinol, also known as delta-8 THC, is a psychoactive substance found in the Cannabis sativa plant, of which marijuana and hemp are two varieties. THC is responsible for the high associated with the illicit use of marijuana.  Applicable to: All individuals currently taking or considering taking CBD (cannabidiol) and, more important, all patients taking opioid analgesic controlled substances (pain medication). (Example: oxycodone; oxymorphone; hydrocodone; hydromorphone; morphine; methadone;  tramadol; tapentadol; fentanyl; buprenorphine; butorphanol; dextromethorphan; meperidine; codeine; etc.)  Legal status: CBD remains a Schedule I drug prohibited for any use. CBD is illegal with one exception. In the [place], CBD has a limited Transport planner (FDA) approval for the treatment of two specific types of epilepsy disorders. Only one CBD  product has been approved by the FDA for this purpose: "Epidiolex". FDA is aware that some companies are marketing products containing cannabis and cannabis-derived compounds in ways that violate the [employer], Drug and Cosmetic Act [facility] Act) and that may put the health and safety of consumers at risk. The FDA, a Federal agency, has not enforced the CBD status since 2018. UPDATE: (03/24/2021) The Drug Enforcement Agency ([place]) issued a letter stating that "delta" cannabinoids, including Delta-8-THCO and Delta-9-THCO, synthetically derived from hemp do not qualify as hemp and will be viewed as Schedule I drugs. (Schedule I drugs, substances, or chemicals are defined as drugs with no currently accepted medical use and a high potential for abuse. Some examples of Schedule I drugs are: heroin, lysergic acid diethylamide (LSD), marijuana (cannabis), 3,4-methylenedioxymethamphetamine (ecstasy), methaqualone, and peyote.) ([email or web address]://jennings.[email or web address]/)  Legality: Some manufacturers ship CBD products nationally, which is illegal. Often such products are sold online and are therefore available throughout the country. CBD is openly sold in head shops and health food stores in some states where such sales have not been explicitly legalized. Selling unapproved products with unsubstantiated therapeutic claims is not only a violation of the law, but also can put patients at risk, as these products have not been proven to be safe or effective. Federal illegality makes it difficult to conduct research on CBD.  Reference: "FDA Regulation of Cannabis and Cannabis-Derived  Products, Including Cannabidiol (CBD)" - [employer]  Warning: CBD is not FDA approved and has not undergo the same manufacturing controls as prescription drugs.  This means that the purity and safety of available CBD may be questionable. Most of the time, despite manufacturer's claims, it is contaminated with THC (delta-9-tetrahydrocannabinol - the chemical in marijuana responsible for the "HIGH").  When this is the case, the [facility] contaminant will trigger a positive urine drug screen (UDS) test for Marijuana (carboxy-THC). Because a positive UDS for any illicit substance is a violation of our medication agreement, your opioid analgesics (pain medicine) may be permanently discontinued. The FDA recently put out a warning about 5 things that everyone should be aware of regarding Delta-8 THC: Delta-8 THC products have not been evaluated or approved by the FDA for safe use and may be marketed in ways that put the public health at risk. The FDA has received adverse event reports involving delta-8 THC-containing products. Delta-8 THC has psychoactive and intoxicating effects. Delta-8 THC manufacturing often involve use of potentially harmful chemicals to create the concentrations of delta-8 THC claimed in the marketplace. The final delta-8 THC product may have potentially harmful by-products (contaminants) due to the chemicals used in the process. Manufacturing of delta-8 THC products may occur in uncontrolled or unsanitary settings, which may lead to the presence of unsafe contaminants or other potentially harmful substances. Delta-8 THC products should be kept out of the reach of children and pets.  MORE ABOUT CBD  General Information: CBD was discovered in 21 and it is a derivative of the cannabis sativa genus plants (Marijuana and Hemp). It is one of the 113 identified substances found  in Marijuana. It accounts for up to 40% of the plant's extract. As of 2018, preliminary clinical studies on CBD included research for the treatment of anxiety, movement disorders, and pain. CBD is available and consumed in multiple forms, including inhalation of smoke or vapor, as an aerosol spray, and by mouth. It may be supplied as an oil containing CBD, capsules, dried cannabis, or as a liquid solution. CBD is thought not to be  as psychoactive as THC (delta-9-tetrahydrocannabinol - the chemical in marijuana responsible for the "HIGH"). Studies suggest that CBD may interact with different biological target receptors in the body, including cannabinoid and other neurotransmitter receptors. As of 2018 the mechanism of action for its biological effects has not been determined.  Side-effects  Adverse reactions: Dry mouth, diarrhea, decreased appetite, fatigue, drowsiness, malaise, weakness, sleep disturbances, and others.  Drug interactions: CBC may interact with other medications such as blood-thinners. Because CBD causes drowsiness on its own, it also increases the drowsiness caused by other medications, including antihistamines (such as Benadryl), benzodiazepines (Xanax, Ativan, Valium), antipsychotics, antidepressants and opioids, as well as alcohol and supplements such as kava, melatonin and St. John's Wort. Be cautious with the following combinations:   Brivaracetam (Briviact) Brivaracetam is changed and broken down by the body. CBD might decrease how quickly the body breaks down brivaracetam. This might increase levels of brivaracetam in the body.  Caffeine Caffeine is changed and broken down by the body. CBD might decrease how quickly the body breaks down caffeine. This might increase levels of caffeine in the body.  Carbamazepine (Tegretol) Carbamazepine is changed and broken down by the body. CBD might decrease how quickly the body breaks down carbamazepine. This might increase levels of  carbamazepine in the body and increase its side effects.  Citalopram (Celexa) Citalopram is changed and broken down by the body. CBD might decrease how quickly the body breaks down citalopram. This might increase levels of citalopram in the body and increase its side effects.  Clobazam (Onfi) Clobazam is changed and broken down by the liver. CBD might decrease how quickly the liver breaks down clobazam. This might increase the effects and side effects of clobazam.  Eslicarbazepine (Aptiom) Eslicarbazepine is changed and broken down by the body. CBD might decrease how quickly the body breaks down eslicarbazepine. This might increase levels of eslicarbazepine in the body by a small amount.  Everolimus (Zostress) Everolimus is changed and broken down by the body. CBD might decrease how quickly the body breaks down everolimus. This might increase levels of everolimus in the body.  Lithium Taking higher doses of CBD might increase levels of lithium. This can increase the risk of lithium toxicity.  Medications changed by the liver (Cytochrome P450 1A1 (CYP1A1) substrates) Some medications are changed and broken down by the liver. CBD might change how quickly the liver breaks down these medications. This could change the effects and side effects of these medications.  Medications changed by the liver (Cytochrome P450 1A2 (CYP1A2) substrates) Some medications are changed and broken down by the liver. CBD might change how quickly the liver breaks down these medications. This could change the effects and side effects of these medications.  Medications changed by the liver (Cytochrome P450 1B1 (CYP1B1) substrates) Some medications are changed and broken down by the liver. CBD might change how quickly the liver breaks down these medications. This could change the effects and side effects of these medications.  Medications changed by the liver (Cytochrome P450 2A6 (CYP2A6) substrates) Some medications  are changed and broken down by the liver. CBD might change how quickly the liver breaks down these medications. This could change the effects and side effects of these medications.  Medications changed by the liver (Cytochrome P450 2B6 (CYP2B6) substrates) Some medications are changed and broken down by the liver. CBD might change how quickly the liver breaks down these medications. This could change the effects and side effects of these medications.  Medications changed  by the liver (Cytochrome P450 2C19 (CYP2C19) substrates) Some medications are changed and broken down by the liver. CBD might change how quickly the liver breaks down these medications. This could change the effects and side effects of these medications.  Medications changed by the liver (Cytochrome P450 2C8 (CYP2C8) substrates) Some medications are changed and broken down by the liver. CBD might change how quickly the liver breaks down these medications. This could change the effects and side effects of these medications.  Medications changed by the liver (Cytochrome P450 2C9 (CYP2C9) substrates) Some medications are changed and broken down by the liver. CBD might change how quickly the liver breaks down these medications. This could change the effects and side effects of these medications.  Medications changed by the liver (Cytochrome P450 2D6 (CYP2D6) substrates) Some medications are changed and broken down by the liver. CBD might change how quickly the liver breaks down these medications. This could change the effects and side effects of these medications.  Medications changed by the liver (Cytochrome P450 2E1 (CYP2E1) substrates) Some medications are changed and broken down by the liver. CBD might change how quickly the liver breaks down these medications. This could change the effects and side effects of these medications.  Medications changed by the liver (Cytochrome P450 3A4 (CYP3A4) substrates) Some medications are  changed and broken down by the liver. CBD might change how quickly the liver breaks down these medications. This could change the effects and side effects of these medications.  Medications changed by the liver (Glucuronidated drugs) Some medications are changed and broken down by the liver. CBD might change how quickly the liver breaks down these medications. This could change the effects and side effects of these medications.  Medications that decrease the breakdown of other medications by the liver (Cytochrome P450 2C19 (CYP2C19) inhibitors) CBD is changed and broken down by the liver. Some drugs decrease how quickly the liver changes and breaks down CBD. This could change the effects and side effects of CBD.  Medications that decrease the breakdown of other medications in the liver (Cytochrome P450 3A4 (CYP3A4) inhibitors) CBD is changed and broken down by the liver. Some drugs decrease how quickly the liver changes and breaks down CBD. This could change the effects and side effects of CBD.  Medications that increase breakdown of other medications by the liver (Cytochrome P450 3A4 (CYP3A4) inducers) CBD is changed and broken down by the liver. Some drugs increase how quickly the liver changes and breaks down CBD. This could change the effects and side effects of CBD.  Medications that increase the breakdown of other medications by the liver (Cytochrome P450 2C19 (CYP2C19) inducers) CBD is changed and broken down by the liver. Some drugs increase how quickly the liver changes and breaks down CBD. This could change the effects and side effects of CBD.  Methadone (Dolophine) Methadone is broken down by the liver. CBD might decrease how quickly the liver breaks down methadone. Taking cannabidiol along with methadone might increase the effects and side effects of methadone.  Rufinamide (Banzel) Rufinamide is changed and broken down by the body. CBD might decrease how quickly the body breaks down  rufinamide. This might increase levels of rufinamide in the body by a small amount.  Sedative medications (CNS depressants) CBD might cause sleepiness and slowed breathing. Some medications, called sedatives, can also cause sleepiness and slowed breathing. Taking CBD with sedative medications might cause breathing problems and/or too much sleepiness.  Sirolimus (Rapamune) Sirolimus is changed and  broken down by the body. CBD might decrease how quickly the body breaks down sirolimus. This might increase levels of sirolimus in the body.  Stiripentol (Diacomit) Stiripentol is changed and broken down by the body. CBD might decrease how quickly the body breaks down stiripentol. This might increase levels of stiripentol in the body and increase its side effects.  Tacrolimus (Prograf) Tacrolimus is changed and broken down by the body. CBD might decrease how quickly the body breaks down tacrolimus. This might increase levels of tacrolimus in the body.  Tamoxifen (Soltamox) Tamoxifen is changed and broken down by the body. CBD might affect how quickly the body breaks down tamoxifen. This might affect levels of tamoxifen in the body.  Topiramate (Topamax) Topiramate is changed and broken down by the body. CBD might decrease how quickly the body breaks down topiramate. This might increase levels of topiramate in the body by a small amount.  Valproate Valproic acid can cause liver injury. Taking cannabidiol with valproic acid might increase the chance of liver injury. CBD and/or valproic acid might need to be stopped, or the dose might need to be reduced.  Warfarin (Coumadin) CBD might increase levels of warfarin, which can increase the risk for bleeding. CBD and/or warfarin might need to be stopped, or the dose might need to be reduced.  Zonisamide Zonisamide is changed and broken down by the body. CBD might decrease how quickly the body breaks down zonisamide. This might increase levels of  zonisamide in the body by a small amount. (Last update: 04/05/2021) ____________________________________________________________________________________________    ______________________________________________________________________________________________  Specialty Pain Scale  Introduction:  There are significant differences in how pain is reported. The word pain usually refers to physical pain, but it is also a common synonym of suffering. The medical community uses a scale from 0 (zero) to 10 (ten) to report pain level. Zero (0) is described as "no pain", while ten (10) is described as "the worse pain you can imagine". The problem with this scale is that physical pain is reported along with suffering. Suffering refers to mental pain, or more often yet it refers to any unpleasant feeling, emotion or aversion associated with the perception of harm or threat of harm. It is the psychological component of pain.  Pain Specialists prefer to separate the two components. The pain scale used by this practice is the Verbal Numerical Rating Scale (VNRS-11). This scale is for the physical pain only. DO NOT INCLUDE how your pain psychologically affects you. This scale is for adults 73 years of age and older. It has 11 (eleven) levels. The 1st level is 0/10. This means: "right now, I have no pain". In the context of pain management, it also means: "right now, my physical pain is under control with the current therapy".  General Information:  The scale should reflect your current level of pain. Unless you are specifically asked for the level of your worst pain, or your average pain. If you are asked for one of these two, then it should be understood that it is over the past 24 hours.  Levels 1 (one) through 5 (five) are described below, and can be treated as an outpatient. Ambulatory pain management facilities such as ours are more than adequate to treat these levels. Levels 6 (six) through 10 (ten) are also  described below, however, these must be treated as a hospitalized patient. While levels 6 (six) and 7 (seven) may be evaluated at an urgent care facility, levels 8 (eight) through 10 (ten)  constitute medical emergencies and as such, they belong in a hospital's emergency department. When having these levels (as described below), do not come to our office. Our facility is not equipped to manage these levels. Go directly to an urgent care facility or an emergency department to be evaluated.  Definitions:  Activities of Daily Living (ADL): Activities of daily living (ADL or ADLs) is a term used in healthcare to refer to people's daily self-care activities. Health professionals often use a person's ability or inability to perform ADLs as a measurement of their functional status, particularly in regard to people post injury, with disabilities and the elderly. There are two ADL levels: Basic and Instrumental. Basic Activities of Daily Living (BADL  or BADLs) consist of self-care tasks that include: Bathing and showering; personal hygiene and grooming (including brushing/combing/styling hair); dressing; Toilet hygiene (getting to the toilet, cleaning oneself, and getting back up); eating and self-feeding (not including cooking or chewing and swallowing); functional mobility, often referred to as "transferring", as measured by the ability to walk, get in and out of bed, and get into and out of a chair; the broader definition (moving from one place to another while performing activities) is useful for people with different physical abilities who are still able to get around independently. Basic ADLs include the things many people do when they get up in the morning and get ready to go out of the house: get out of bed, go to the toilet, bathe, dress, groom, and eat. On the average, loss of function typically follows a particular order. Hygiene is the first to go, followed by loss of toilet use and locomotion. The last to  go is the ability to eat. When there is only one remaining area in which the person is independent, there is a 62.9% chance that it is eating and only a 3.5% chance that it is hygiene. Instrumental Activities of Daily Living (IADL or IADLs) are not necessary for fundamental functioning, but they let an individual live independently in a community. IADL consist of tasks that include: cleaning and maintaining the house; home establishment and maintenance; care of others (including selecting and supervising caregivers); care of pets; child rearing; managing money; managing financials (investments, etc.); meal preparation and cleanup; shopping for groceries and necessities; moving within the community; safety procedures and emergency responses; health management and maintenance (taking prescribed medications); and using the telephone or other form of communication.  Instructions:  Most patients tend to report their pain as a combination of two factors, their physical pain and their psychosocial pain. This last one is also known as "suffering" and it is reflection of how physical pain affects you socially and psychologically. From now on, report them separately.  From this point on, when asked to report your pain level, report only your physical pain. Use the following table for reference.  Pain Clinic Pain Levels (0-5/10)  Pain Level Score  Description  No Pain 0   Mild pain 1 Nagging, annoying, but does not interfere with basic activities of daily living (ADL). Patients are able to eat, bathe, get dressed, toileting (being able to get on and off the toilet and perform personal hygiene functions), transfer (move in and out of bed or a chair without assistance), and maintain continence (able to control bladder and bowel functions). Blood pressure and heart rate are unaffected. A normal heart rate for a healthy adult ranges from 60 to 100 bpm (beats per minute).   Mild to moderate pain 2 Noticeable and  distracting. Impossible to hide from other people. More frequent flare-ups. Still possible to adapt and function close to normal. It can be very annoying and may have occasional stronger flare-ups. With discipline, patients may get used to it and adapt.   Moderate pain 3 Interferes significantly with activities of daily living (ADL). It becomes difficult to feed, bathe, get dressed, get on and off the toilet or to perform personal hygiene functions. Difficult to get in and out of bed or a chair without assistance. Very distracting. With effort, it can be ignored when deeply involved in activities.   Moderately severe pain 4 Impossible to ignore for more than a few minutes. With effort, patients may still be able to manage work or participate in some social activities. Very difficult to concentrate. Signs of autonomic nervous system discharge are evident: dilated pupils (mydriasis); mild sweating (diaphoresis); sleep interference. Heart rate becomes elevated (>115 bpm). Diastolic blood pressure (lower number) rises above 100 mmHg. Patients find relief in laying down and not moving.   Severe pain 5 Intense and extremely unpleasant. Associated with frowning face and frequent crying. Pain overwhelms the senses.  Ability to do any activity or maintain social relationships becomes significantly limited. Conversation becomes difficult. Pacing back and forth is common, as getting into a comfortable position is nearly impossible. Pain wakes you up from deep sleep. Physical signs will be obvious: pupillary dilation; increased sweating; goosebumps; brisk reflexes; cold, clammy hands and feet; nausea, vomiting or dry heaves; loss of appetite; significant sleep disturbance with inability to fall asleep or to remain asleep. When persistent, significant weight loss is observed due to the complete loss of appetite and sleep deprivation.  Blood pressure and heart rate becomes significantly elevated. Caution: If elevated  blood pressure triggers a pounding headache associated with blurred vision, then the patient should immediately seek attention at an urgent or emergency care unit, as these may be signs of an impending stroke.    Emergency Department Pain Levels (6-10/10)  Emergency Room Pain 6 Severely limiting. Requires emergency care and should not be seen or managed at an outpatient pain management facility. Communication becomes difficult and requires great effort. Assistance to reach the emergency department may be required. Facial flushing and profuse sweating along with potentially dangerous increases in heart rate and blood pressure will be evident.   Distressing pain 7 Self-care is very difficult. Assistance is required to transport, or use restroom. Assistance to reach the emergency department will be required. Tasks requiring coordination, such as bathing and getting dressed become very difficult.   Disabling pain 8 Self-care is no longer possible. At this level, pain is disabling. The individual is unable to do even the most "basic" activities such as walking, eating, bathing, dressing, transferring to a bed, or toileting. Fine motor skills are lost. It is difficult to think clearly.   Incapacitating pain 9 Pain becomes incapacitating. Thought processing is no longer possible. Difficult to remember your own name. Control of movement and coordination are lost.   The worst pain imaginable 10 At this level, most patients pass out from pain. When this level is reached, collapse of the autonomic nervous system occurs, leading to a sudden drop in blood pressure and heart rate. This in turn results in a temporary and dramatic drop in blood flow to the brain, leading to a loss of consciousness. Fainting is one of the body's self defense mechanisms. Passing out puts the brain in a calmed state and causes it to shut down for a  while, in order to begin the healing process.    Summary: 1.   Refer to this scale when  providing [place] with your pain level. 2.   Be accurate and careful when reporting your pain level. This will help with your care. 3.   Over-reporting your pain level will lead to loss of credibility. 4.   Even a level of 1/10 means that there is pain and will be treated at our facility. 5.   High, inaccurate reporting will be documented as "Symptom Exaggeration", leading to loss of credibility and suspicions of possible secondary gains such as obtaining more narcotics, or wanting to appear disabled, for fraudulent reasons. 6.   Only pain levels of 5 or below will be seen at our facility. 7.   Pain levels of 6 and above will be sent to the Emergency Department and the appointment cancelled.  ______________________________________________________________________________________________

## 2021-10-11 NOTE — Progress Notes (Signed)
Patient: Maria Keller  Service Category: E/M  Provider: Gaspar Cola, MD  DOB: [birth year]  DOS: 10/12/2021  Referring Provider: Mohammed Kindle, MD  MRN: [id number]  Setting: Ambulatory outpatient  PCP: Einar Pheasant, MD  Type: New Patient  Specialty: Interventional Pain Management    Location: Office  Delivery: Face-to-face     Primary Reason(s) for Visit: Encounter for initial evaluation of one or more chronic problems (new to examiner) potentially causing chronic pain, and posing a threat to normal musculoskeletal function. (Level of risk: High) CC: Back Pain and Knee Pain (Right)  HPI  Maria Keller is a 71 y.o. year old, female patient, who comes for the first time to our practice referred by Mohammed Kindle, MD for our initial evaluation of her chronic pain. She has History of lymphoma; Pure hypercholesterolemia; Nephrolithiasis; Gastro-esophageal reflux disease without esophagitis; Fibromyalgia; Rash; Skin nodule; Enterocele; SOB (shortness of breath); Pain in joint involving multiple sites; Rectocele; Non-Hodgkin's lymphoma ([place]); Chronic low back pain (1ry area of Pain) (Bilateral) (R>L) w/o sciatica; Chronic sacroiliac joint pain; Hypercholesterolemia; Health care maintenance; DDD (degenerative disc disease), lumbar; Facet syndrome, lumbar; Sacroiliac joint dysfunction; Postherpetic neuralgia; Degenerative disc disease, lumbar; Lumbar radiculopathy; Night sweats; Acute bronchitis; Cough; Overweight (BMI 25.0-29.9); Anemia; Acute upper respiratory infection; Hair loss; Headache; 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[facility]); Fatigue; Skin lesion; Hyperglycemia; Folliculitis; R91 deficiency; Ear lesion; Tingling sensation; External nasal lesion; Knee swelling; Breast cancer screening; Estrogen deficiency; Dysuria; Chipped tooth; Loose total knee arthroplasty, sequela (Right); 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, right; Diverticulosis; History of shingles; History of urinary stone; Intractable  migraine with aura without status migrainosus; Myalgia and myositis; Osteoarthritis; Primary osteoarthritis of right knee; History of total knee replacement (Right); Sleep apnea; Lymphoma ([place]); Psoriasis (a type of skin inflammation); Chronic pain syndrome; Pharmacologic therapy; Disorder of skeletal system; Problems influencing health status; Chronic use of opiate for therapeutic purpose; Opioid use (487.5 MME/day); Drug tolerance, sequela; Physical tolerance to opiate drug; Chronic thigh pain (2ry area of Pain) (Bilateral) (R>L); Chronic knee pain after total replacement (3ry area of Pain) (Right); and Chronic shoulder pain (4th area of Pain) (Bilateral) (R>L) on their problem list. Today she comes in for evaluation of her Back Pain and Knee Pain (Right)  Pain Assessment: Location: Right, Left Back Radiating: hips,buttocks bilateral Onset: More than a month ago Duration: Chronic pain Quality: Sharp, Burning, Constant, Tender, Tightness, Discomfort Severity: 4 /10 (subjective, self-reported pain score)  Effect on ADL: Daily ADL's, "I cant do much of anything" Timing: Constant Modifying factors: heat, relax, hot tub,ice,biofreze medications BP: 121/63  HR: 72  Onset and Duration: Present longer than 3 months Cause of pain: Unknown Severity: No change since onset, NAS-11 at its worse: 8/10, NAS-11 at its best: 3/10, NAS-11 now: 4/10, and NAS-11 on the average: 3/10 Timing: Not influenced by the time of the day and During activity or exercise Aggravating Factors: Climbing, Kneeling, Lifiting, Prolonged sitting, Prolonged standing, Twisting, Walking, and Walking uphill Alleviating Factors: Hot packs, Lying down, Medications, Resting, and Warm showers or baths Associated Problems: Constipation, Fatigue, Numbness, Sweating, Pain that wakes patient up, and Pain that does not allow patient to sleep Quality of Pain: Agonizing, Constant, Deep, Exhausting, Nagging, Sharp, and Uncomfortable Previous  Examinations or Tests: CT scan, Ct-Myelogram, Endoscopy, MRI scan, Nerve block, Neurological evaluation, Orthopedic evaluation, and Chiropractic evaluation Previous Treatments: Biofeedback, Chiropractic manipulations, Epidural steroid injections, Narcotic medications, Physical Therapy, Pool exercises, Radiofrequency, Relaxation therapy, Stretching exercises, TENS, and Trigger point injections  According  to the patient the primary area of pain is that of the lower back (Bilateral) (R>L).  The patient indicates that the pain goes all the way down to her coccyx.  She denies any prior surgeries, any recent x-rays or imaging studies, she indicates having had multiple nerve blocks in her back by Dr. Mohammed Kindle, but none of them recent.  She also indicates having [facility] physical [facility].  The patient's secondary area pain is that of her thighs (Bilateral) (R>L).  She denies any prior surgeries in that area, any recent x-rays, no nerve blocks, but she does indicate that approximately 6 months ago Dr. Roland Rack ([place]) did a right leg injection, but she is not sure what kind of injection it was.  The patient's third area pain is that of the right knee.  She indicates having had the right knee replaced more than 5 years ago by Dr. Vilinda Flake.  Because of the pain that she was experiencing, she had this evaluated by Dr. Roland Rack who diagnosed her as having some hardware loosening and requiring a revision of the total knee replacement.  She is scheduled to undergo the surgery on November 06, 2021.  When asked, the patient indicated that Dr. Roland Rack will manage her postoperative pain.  I reminded her that we do not manage any acute postop pain.  The patient's fourth area of pain is that of the shoulders (Bilateral) (R>L).  She denies any prior surgeries, joint injections, any recent imaging studies, or physical therapy.  Pharmacotherapy: (Prescribed by Dr.  Mohammed Kindle) Oxycodone IR 5 mg, 1 tab p.o. up to 5 times per day (PRN for breakthrough pain). Duragesic patch 25 mcg/h every 48 hours Duragesic patch 100 mcg/h every 48 hours  Pharmacies: [facility] in [place], [place], [place] [phone].  As of today, they still have 1 box of fentanyl 100 mcg patches, 1 box of fentanyl 25 mcg patches, and a prescription for oxycodone IR 5 mg (150). [facility] in [address], [place], [place] [phone].  As of today, they have 1 box of fentanyl 25 mcg patches.  Today I have explained to the patient that I do not take patients for medication management only.  However, since she indicated that she is interested in coming off of her opioids, I have agreed to evaluate her case.  The patient was informed that once we take over her medications and begin the tapering process to get her off of the medications, there will not be any type of negotiations to slow down or stop that process.  I have also explained to the patient that using the smallest available unit of opioid (Oxy IR 5 mg tablet) (7.5 MME) we will slowly decrease her opioid consumption by those 7.5 MME's per week.  Since he is currently taking an estimated 487.5 MME/day, he will take [place] approximately 16 months to be completely off of the opioids.  Today I have warned the patient about avoiding drops in dosing larger than the amounts that we will be instructing her to decrease.  She understood and accepted.  Since she is pending to have a revision of her total knee replacement on November 06, 2021, he will be difficult to accurately decrease in control her opioid use knowing that she will require some pain medication around the time of her surgery.  She indicates that after her initial replacement, it took her approximately 3 weeks to be able  to recover and begin her physical therapy at [facility] physical therapy.  Assuming that this time she has a similar recovery period, then it is likely  that we will begin tapering her opioids starting at the end of October.  In giving her some advice of what she can do until then, it turns out that she refers not taking the oxycodone IR 5 mg 5 times a day.  She refers that occasionally she will only take 1/day.  I asked her if she would be able to continue taking only 1 pill/day and if so, to try to do that until I see her back after her knee surgery.  We also took the time to call her pharmacies and we learned that she has prescriptions left for oxycodone, Duragesic 25 and 100 mcg/h patches.  I have recommended that she try changing her patches every 72 hours instead of every 48 hours as she has been doing.  In any case, the Duragesic patches are technically supposed to be changed every 3 days and not every 2 days.  If we can have her do those 2 changes, her prescriptions should last until the very end of October.  I do realize that she will probably get additional pain medicine for the postop.  From the orthopedic surgeon, and this would be okay as long as she keeps her chronic pain regiment as instructed.  The patient indicated that she thinks she can do this without problems.  I have also informed the patient that because of the pain ordered an amount of pain medication that she is taking, I will probably need to see her on a monthly basis until we can have her completely off of her opioids.  Again, she understood and accepted.  Historic Controlled Substance Pharmacotherapy Review  PMP and historical list of controlled substances: Oxycodone IR 5 mg tablet, 1 tab p.o. 5 times a day (#150) (last filled on 09/20/2021); fentanyl 25 mcg/h patch every 48 hours (#15) (last filled on 09/15/2021) + fentanyl 100 mcg/h patch every 48 hours (# 15) (last filled on 09/12/2021). Current opioid analgesics: Oxycodone IR 5 mg tablet, 1 tab p.o. 5 times daily (#150) (last filled on 09/20/2021) (37.5 MME/day); fentanyl 25 mcg/h patch every 48 hours (#15) (last filled on  09/15/2021) (90 MME/day) + fentanyl 100 mcg/h patch every 48 hours (# 15) (last filled on 09/12/2021) (360 MME/day). MME/day: 487.5 mg/day  Historical Monitoring: The patient  reports no history of drug use. List of prior UDS Testing: No results found for: "MDMA", "COCAINSCRNUR", "PCPSCRNUR", "PCPQUANT", "CANNABQUANT", "THCU", "ETH", "CBDTHCR", "D8THCCBX", "D9THCCBX" Historical Background Evaluation: [place] PMP: PDMP reviewed during this encounter. Review of the past 72-month conducted.             PMP NARX Score Report:  Narcotic: 423 Sedative: 170 Stimulant: 000 Maria Keller Department of public safety, offender search: (Editor, commissioningInformation) Non-contributory Risk Assessment Profile: Aberrant behavior: None observed or detected today Risk factors for fatal opioid overdose: None identified today PMP NARX Overdose Risk Score: 350 Fatal overdose hazard ratio (HR): Calculation deferred Non-fatal overdose hazard ratio (HR): Calculation deferred Risk of opioid abuse or dependence: 0.7-3.0% with doses ? 36 MME/day and 6.1-26% with doses ? 120 MME/day. Substance use disorder (SUD) risk level: See below Personal History of Substance Abuse (SUD-Substance use disorder):  Alcohol: Negative  Illegal Drugs: Negative  Rx Drugs: Negative  ORT Risk Level calculation: Low Risk  Opioid Risk Tool - 10/12/21 1409       Family History of  Substance Abuse   Alcohol Negative    Illegal Drugs Negative    Rx Drugs Negative      Personal History of Substance Abuse   Alcohol Negative    Illegal Drugs Negative    Rx Drugs Negative      Age   Age between 84-45 years  No      History of Preadolescent Sexual Abuse   History of Preadolescent Sexual Abuse Negative or Female      Psychological Disease   Psychological Disease Negative    Depression Negative      Total Score   Opioid Risk Tool Scoring 0    Opioid Risk Interpretation Low Risk            ORT Scoring interpretation table:  Score <3 = Low Risk for  SUD  Score between 4-7 = Moderate Risk for SUD  Score >8 = High Risk for Opioid Abuse   PHQ-2 Depression Scale:  Total score: 0  PHQ-2 Scoring interpretation table: (Score and probability of major depressive disorder)  Score 0 = No depression  Score 1 = 15.4% Probability  Score 2 = 21.1% Probability  Score 3 = 38.4% Probability  Score 4 = 45.5% Probability  Score 5 = 56.4% Probability  Score 6 = 78.6% Probability   PHQ-9 Depression Scale:  Total score: 0  PHQ-9 Scoring interpretation table:  Score 0-4 = No depression  Score 5-9 = Mild depression  Score 10-14 = Moderate depression  Score 15-19 = Moderately severe depression  Score 20-27 = Severe depression (2.4 times higher risk of SUD and 2.89 times higher risk of overuse)   Pharmacologic Plan: As per protocol, I have not taken over any controlled substance management, pending the results of ordered tests and/or consults.            Initial impression: Pending review of available data and ordered tests.  Meds   Current Outpatient Medications:    aspirin EC 81 MG tablet, Take by mouth., Disp: , Rfl:    atorvastatin (LIPITOR) 10 MG tablet, Take 1 tablet (10 mg total) by mouth daily., Disp: 30 tablet, Rfl: 2   azelastine (ASTELIN) 0.1 % nasal spray, 1 SPRAY IN EACH NOSTRIL TWICE DAILY AS DIRECTED, Disp: 30 mL, Rfl: 1   calcium carbonate (TUMS - DOSED IN MG ELEMENTAL CALCIUM) 500 MG chewable tablet, Chew 1 tablet by mouth as needed., Disp: , Rfl:    cetirizine (ZYRTEC) 10 MG tablet, Take by mouth., Disp: , Rfl:    Diphenhyd-Hydrocort-Nystatin (FIRST-DUKES MOUTHWASH) SUSP, Swish and spit 5 mLs tid prn, Disp: 37 mL, Rfl: 0   estradiol (ESTRACE) 0.5 MG tablet, TAKE 1 TABLET BY MOUTH DAILY., Disp: 90 tablet, Rfl: 1   fentaNYL (DURAGESIC) 100 MCG/HR, Apply 1 patch to skin every 2 days with fentanyl patch 25 g per hour if tolerated, Disp: 15 patch, Rfl: 0   fentaNYL (DURAGESIC) 25 MCG/HR patch, Apply 1 patch to skin every 2 days with  fentanyl patch 100 g per hour if tolerated, Disp: 15 patch, Rfl: 0   fluconazole (DIFLUCAN) 150 MG tablet, Take 1 tablet x 1.  If persistent symptoms, may repeat x 1 in 3 days., Disp: 2 tablet, Rfl: 0   fluticasone (FLONASE) 50 MCG/ACT nasal spray, Place 2 sprays into both nostrils daily., Disp: 48 g, Rfl: 3   furosemide (LASIX) 20 MG tablet, TAKE ONE TABLET DAILY AS NEEDED, Disp: 90 tablet, Rfl: 0   lidocaine (LIDODERM) 5 %, SMARTSIG:Topical, Disp: , Rfl:  mupirocin ointment (BACTROBAN) 2 %, Apply to affected area bid., Disp: 22 g, Rfl: 0   nitrofurantoin, macrocrystal-monohydrate, (MACROBID) 100 MG capsule, Take 1 capsule (100 mg total) by mouth 2 (two) times daily., Disp: 10 capsule, Rfl: 0   omeprazole (PRILOSEC) 20 MG capsule, TAKE 1 CAPSULE BY MOUTH TWICE DAILY, Disp: 180 capsule, Rfl: 2   oxyCODONE (ROXICODONE) 5 MG immediate release tablet, Limit 1 tab by mouth per day or twice per day if tolerated for breakthrough pain while wearing fentanyl patches, Disp: 50 tablet, Rfl: 0   senna-docusate (SENOKOT-S) 8.6-50 MG per tablet, Take by mouth., Disp: , Rfl:   Imaging Review  Lumbosacral Imaging: Lumbar DG 2-3 views: Results for orders placed in visit on 11/12/19 DG Lumbar Spine 2-3 Views  Narrative CLINICAL DATA:  Low back pain  EXAM: LUMBAR SPINE - 2-3 VIEW  COMPARISON:  March 17, 2013  FINDINGS: There are five non-rib bearing lumbar-type vertebral bodies. There is normal alignment. There is no evidence for acute fracture or subluxation. Intervertebral disc spaces are preserved without significant degenerative changes. Minimal multilevel endplate proliferative changes. Surgical clips project over the RIGHT abdomen.  IMPRESSION: No acute osseous abnormality in the lumbar spine.   Electronically Signed By: Valentino Saxon MD On: 11/12/2019 15:39  Knee Imaging: Knee-R CT wo contrast: Results for orders placed during the hospital encounter of 02/13/21 CT KNEE RIGHT  WO CONTRAST  Narrative CLINICAL DATA:  Right knee pain and swelling below the right knee for 3 months. 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 History of follicular lymphoma in 3382.  EXAM: CT OF THE right KNEE WITHOUT CONTRAST  TECHNIQUE: Multidetector CT imaging of the right knee was performed according to the standard protocol. Multiplanar CT image reconstructions were also generated.  COMPARISON:  MRI right calf 01/06/2021, MRI right knee 11/30/2015  FINDINGS: Bones/Joint/Cartilage  Postsurgical changes are again seen of total right knee arthroplasty. Additional transverse screw within the distal femoral metaphysis. This hardware produces streak artifacts that limits evaluation of the distal femur greater than the proximal tibia. Within this limitation, no definite perihardware lucency is seen to indicate CT evidence of hardware loosening. Tiny joint effusion. No acute fracture is seen. No dislocation.  Ligaments  Suboptimally assessed by CT.  Muscles and Tendons  There is again focal fat seen within the far medial aspect of the mid to distal soleus musculature, as on 01/06/2021 MRI.  Soft tissues  Otherwise unremarkable.  IMPRESSION: Status post total right knee arthroplasty without evidence of hardware failure.   Electronically Signed By: Yvonne Kendall On: 02/14/2021 09:02  Complexity Note: Imaging results reviewed.                         ROS  Cardiovascular: Needs antibiotics prior to dental procedures Pulmonary or Respiratory: Coughing up mucus (Bronchitis) Neurological: Abnormal skin sensations (Peripheral Neuropathy) Psychological-Psychiatric: No reported psychological or psychiatric signs or symptoms such as difficulty sleeping, anxiety, depression, delusions or hallucinations (schizophrenial), mood swings (bipolar disorders) or suicidal ideations or attempts Gastrointestinal: Heartburn due to stomach pushing into lungs (Hiatal hernia),  Reflux or heatburn, and Alternating episodes iof diarrhea and constipation (IBS-Irritable bowe syndrome) Genitourinary: Passing kidney stones Hematological: Weakness due to low blood hemoglobin or red blood cell count (Anemia) and Brusing easily Endocrine: No reported endocrine signs or symptoms such as high or low blood sugar, rapid heart rate due to high thyroid levels, obesity or weight gain due to slow thyroid or thyroid disease  Rheumatologic: Generalized muscle aches (Fibromyalgia) Musculoskeletal: Negative for myasthenia gravis, muscular dystrophy, multiple sclerosis or malignant hyperthermia Work History: Disabled  Allergies  Maria Keller is allergic to oxycodone hcl, oxycontin [oxycodone hcl], penicillins, chloral hydrate, ciprofloxacin, ciprofloxacin hcl, colace [docusate calcium], methotrexate derivatives, parafon forte dsc [chlorzoxazone], penicillin g, tape, bextra [valdecoxib], oxycodone, and rofecoxib.  Laboratory Chemistry Profile   Renal Lab Results  Component Value Date   BUN 13 08/01/2021   CREATININE 0.81 08/01/2021   GFR 72.39 08/01/2021   GFRAA >60 11/06/2012   GFRNONAA >60 11/06/2012   SPECGRAV 1.015 07/21/2021   PHUR 7.0 07/21/2021   PROTEINUR Positive (A) 07/21/2021     Electrolytes Lab Results  Component Value Date   NA 137 08/01/2021   K 4.2 08/01/2021   CL 99 08/01/2021   CALCIUM 9.3 08/01/2021   MG 1.9 10/03/2015     Hepatic Lab Results  Component Value Date   AST 16 08/01/2021   ALT 12 08/01/2021   ALBUMIN 4.0 08/01/2021   ALKPHOS 79 08/01/2021     ID Lab Results  Component Value Date   HCVAB NEGATIVE 05/04/2015     Bone No results found for: "VD25OH", "VD125OH2TOT", "PJ0315XY5", "OP9292KM6", "25OHVITD1", "25OHVITD2", "28MNOTRR1", "TESTOFREE", "TESTOSTERONE"   Endocrine Lab Results  Component Value Date   GLUCOSE 106 (H) 08/01/2021   GLUCOSEU NEGATIVE 08/01/2021   HGBA1C 5.5 08/01/2021   TSH 1.61 02/28/2021     Neuropathy Lab  Results  Component Value Date   VITAMINB12 426 11/10/2019   HGBA1C 5.5 08/01/2021     CNS No results found for: "COLORCSF", "APPEARCSF", "RBCCOUNTCSF", "WBCCSF", "POLYSCSF", "LYMPHSCSF", "EOSCSF", "PROTEINCSF", "GLUCCSF", "JCVIRUS", "CSFOLI", "IGGCSF", "LABACHR", "ACETBL"   Inflammation (CRP: Acute  ESR: Chronic) Lab Results  Component Value Date   ESRSEDRATE 18 01/07/2017     Rheumatology No results found for: "RF", "ANA", "LABURIC", "URICUR", "LYMEIGGIGMAB", "LYMEABIGMQN", "HLAB27"   Coagulation Lab Results  Component Value Date   PLT 181.0 02/28/2021     Cardiovascular Lab Results  Component Value Date   CKTOTAL 55 01/07/2017   HGB 12.3 02/28/2021   HCT 37.8 02/28/2021     Screening Lab Results  Component Value Date   HCVAB NEGATIVE 05/04/2015     Cancer No results found for: "CEA", "CA125", "LABCA2"   Allergens No results found for: "ALMOND", "APPLE", "ASPARAGUS", "AVOCADO", "BANANA", "BARLEY", "BASIL", "BAYLEAF", "GREENBEAN", "LIMABEAN", "WHITEBEAN", "BEEFIGE", "REDBEET", "BLUEBERRY", "BROCCOLI", "CABBAGE", "MELON", "CARROT", "CASEIN", "CASHEWNUT", "CAULIFLOWER", "CELERY"     Note: Lab results reviewed.  [place]  Drug: Maria Keller  reports no history of drug use. Alcohol:  reports no history of alcohol use. Tobacco:  reports that she has never smoked. She has never used smokeless tobacco. Medical:  has a past medical history of Anemia, Arthritis, Bronchitis (08/2015), Chronic back pain, Depression, Diverticulosis, Esophageal stricture, Fibromyalgia, Fibromyalgia, Fibromyalgia, Follicular lymphoma ([place]), Follicular lymphoma ([place]), GERD (gastroesophageal reflux disease), GERD (gastroesophageal reflux disease), Hemorrhoid, Hiatal hernia, History of kidney stones, Hypercholesterolemia, IBS (irritable bowel syndrome), IBS (irritable bowel syndrome), Nephrolithiasis, PONV (postoperative nausea and vomiting), Shingles outbreak (11/26/2011), and Sleep apnea. Family: family  history includes Arthritis in her maternal grandmother; Breast cancer in an other family member; Diabetes in her maternal grandfather and maternal grandmother; Heart disease in her maternal grandmother; Hypertension in her maternal grandmother; Lung cancer in an other family member; Ulcers in her father and mother.  Past Surgical History:  Procedure Laterality Date   APPENDECTOMY     BLADDER SURGERY  1993   BREAST BIOPSY N/A  capsule endoscopy     CHOLECYSTECTOMY  1995   COLONOSCOPY WITH PROPOFOL N/A 06/19/2017   Procedure: COLONOSCOPY WITH PROPOFOL;  Surgeon: Manya Silvas, MD;  Location: [facility] ENDOSCOPY;  Service: Endoscopy;  Laterality: N/A;   ESOPHAGOGASTRODUODENOSCOPY (EGD) WITH PROPOFOL N/A 06/19/2017   Procedure: ESOPHAGOGASTRODUODENOSCOPY (EGD) WITH PROPOFOL;  Surgeon: Manya Silvas, MD;  [facility] END[facility]COPY;  Service: Endoscopy;  Laterality: N/A;   FRACTURE SURGERY     history of helicobacter pylori infection     JOINT REPLACEMENT     LEFT OOPHORECTOMY     LEG SURGERY Left    ORIF ANKLE FRACTURE  2012   OVARIAN CYST REMOVAL     right   RECTOCELE REPAIR N/A    REPLACEMENT TOTAL KNEE Right 03/19/2016   TONSILECTOMY/ADENOIDECTOMY WITH MYRINGOTOMY  1970   TONSILLECTOMY N/A    TUBAL LIGATION  1976   VAGINAL HYSTERECTOMY  1980's   secondary to bleeding   Active Ambulatory Problems    Diagnosis Date Noted   History of lymphoma 11/13/2011   Pure hypercholesterolemia 02/15/2012   Nephrolithiasis 02/15/2012   Gastro-esophageal reflux disease without esophagitis 02/15/2012   Fibromyalgia 02/15/2012   Rash 06/12/2012   Skin nodule 02/22/2013   Enterocele 10/25/2013   SOB (shortness of breath) 11/15/2013   Pain in joint involving multiple sites 03/12/2014   Rectocele 10/27/2013   Non-Hodgkin's lymphoma (H[facility])    Chronic low back pain (1ry area of Pain) (Bilateral) (R>L) w/o sciatica 06/07/2014   Chronic sacroiliac joint pain 06/07/2014   Hypercholesterolemia  06/23/2014   Health care maintenance 07/04/2014   DDD (degenerative disc disease), lumbar 07/13/2014   Facet syndrome, lumbar 07/13/2014   Sacroiliac joint dysfunction 07/13/2014   Postherpetic neuralgia 07/13/2014   Degenerative disc disease, lumbar 10/18/2014   Lumbar radiculopathy 10/18/2014   Night sweats 10/27/2014   Acute bronchitis 03/24/2015   Cough 07/02/2015   Overweight (BMI 25.0-29.9) 07/02/2015   Anemia 11/13/2011   Acute upper respiratory infection 07/29/2015   Hair loss 07/06/2016   Headache 05/03/2017   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[place]) 07/15/2017   Fatigue 11/26/2017   Skin lesion 11/02/2018   Hyperglycemia 16/11/9602   Folliculitis 54/10/8117   B12 deficiency 11/09/2019   Ear lesion 04/10/2020   Tingling sensation 07/11/2020   External nasal lesion 11/13/2020   Knee swelling 12/21/2020   Breast cancer screening 07/06/2021   Estrogen deficiency 07/08/2021   Dysuria 07/22/2021   Chipped tooth 07/22/2021   Loose total knee arthroplasty, sequela (Right) 06/12/2021   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, right 01/04/2021   Diverticulosis 10/11/2021   History of shingles 08/16/2017   History of urinary stone 10/30/2011   Intractable migraine with aura without status migrainosus 08/16/2017   Myalgia and myositis 02/15/2012   Osteoarthritis 07/11/2016   Primary osteoarthritis of right knee 10/24/2015   History of total knee replacement (Right) 09/10/2016   Sleep apnea 10/27/2013   Lymphoma ([place]) 11/13/2011   Psoriasis (a type of skin inflammation) 07/11/2016   Chronic pain syndrome 10/11/2021   Pharmacologic therapy 10/11/2021   Disorder of skeletal system 10/11/2021   Problems influencing health status 10/11/2021   Chronic use of opiate for therapeutic purpose 10/11/2021   Opioid use (487.5 MME/day) 10/11/2021   Drug tolerance, sequela 10/11/2021   Physical tolerance to opiate drug 10/11/2021   Chronic thigh pain (2ry area of Pain)  (Bilateral) (R>L) 10/12/2021   Chronic knee pain after total replacement (3ry area of Pain) (Right) 10/12/2021   Chronic shoulder  pain (4th area of Pain) (Bilateral) (R>L) 10/12/2021   Resolved Ambulatory Problems    Diagnosis Date Noted   Anemia 11/13/2011   Past Medical History:  Diagnosis Date   Arthritis    Bronchitis 08/2015   Chronic back pain    Depression    Esophageal stricture    Follicular lymphoma (HCC)    Follicular lymphoma (HCC)    GERD (gastroesophageal reflux disease)    GERD (gastroesophageal reflux disease)    Hemorrhoid    Hiatal hernia    History of kidney stones    IBS (irritable bowel syndrome)    IBS (irritable bowel syndrome)    PONV (postoperative nausea and vomiting)    Shingles outbreak 11/26/2011   Constitutional Exam  General appearance: Well nourished, well developed, and well hydrated. In no apparent acute distress Vitals:   10/12/21 1341  BP: 121/63  Pulse: 72  Resp: 16  Temp: (!) 97.3 F (36.3 C)  SpO2: 97%  Weight: 172 lb (78 kg)  Height: '5\' 7"'  (1.702 m)   BMI Assessment: Estimated body mass index is 26.94 kg/m as calculated from the following:   Height as of this encounter: '5\' 7"'  (1.702 m).   Weight as of this encounter: 172 lb (78 kg).  BMI interpretation table: BMI level Category Range association with higher incidence of chronic pain  <18 kg/m2 Underweight   18.5-24.9 kg/m2 Ideal body weight   25-29.9 kg/m2 Overweight Increased incidence by 20%  30-34.9 kg/m2 Obese (Class I) Increased incidence by 68%  35-39.9 kg/m2 Severe obesity (Class II) Increased incidence by 136%  >40 kg/m2 Extreme obesity (Class III) Increased incidence by 254%   Patient's current BMI Ideal Body weight  Body mass index is 26.94 kg/m. Ideal body weight: 61.6 kg (135 lb 12.9 oz) Adjusted ideal body weight: 68.2 kg (150 lb 4.5 oz)   BMI Readings from Last 4 Encounters:  10/12/21 26.94 kg/m  08/23/21 27.41 kg/m  07/21/21 27.41 kg/m  07/06/21  27.38 kg/m   Wt Readings from Last 4 Encounters:  10/12/21 172 lb (78 kg)  08/23/21 175 lb (79.4 kg)  07/21/21 175 lb (79.4 kg)  07/06/21 174 lb 12.8 oz (79.3 kg)    Psych/Mental status: Alert, oriented x 3 (person, place, & time)       Eyes: PERLA Respiratory: No evidence of acute respiratory distress  Assessment  Primary Diagnosis & Pertinent Problem List: The primary encounter diagnosis was Chronic pain syndrome. Diagnoses of Pharmacologic therapy, Chronic use of opiate for therapeutic purpose, Opioid use (487.5 MME/day), Drug tolerance, sequela, Physical tolerance to opiate drug, Disorder of skeletal system, Problems influencing health status, B12 deficiency, History of total knee replacement (Right), Loose total knee arthroplasty, sequela (Right), Chronic low back pain (1ry area of Pain) (Bilateral) (R>L) w/o sciatica, Chronic thigh pain (2ry area of Pain) (Bilateral) (R>L), Chronic knee pain after total replacement (3ry area of Pain) (Right), and Chronic shoulder pain (4th area of Pain) (Bilateral) (R>L) were also pertinent to this visit.  Visit Diagnosis (New problems to examiner): 1. Chronic pain syndrome   2. Pharmacologic therapy   3. Chronic use of opiate for therapeutic purpose   4. Opioid use (487.5 MME/day)   5. Drug tolerance, sequela   6. Physical tolerance to opiate drug   7. Disorder of skeletal system   8. Problems influencing health status   9. B12 deficiency   10. History of total knee replacement (Right)   11. Loose total knee arthroplasty, sequela (Right)  12. Chronic low back pain (1ry area of Pain) (Bilateral) (R>L) w/o sciatica   13. Chronic thigh pain (2ry area of Pain) (Bilateral) (R>L)   14. Chronic knee pain after total replacement (3ry area of Pain) (Right)   15. Chronic shoulder pain (4th area of Pain) (Bilateral) (R>L)    Plan of Care (Initial workup plan)  Note: Maria Keller was reminded that as per protocol, today's visit has been an evaluation  only. We have not taken over the patient's controlled substance management.  Problem-specific plan: No problem-specific Assessment & Plan notes found for this encounter.  Lab Orders         Compliance Drug Analysis, Ur         Comp. Metabolic Panel (12)         Magnesium         Vitamin B12         Sedimentation rate         25-Hydroxy vitamin D Lcms D2+D3         C-reactive protein         Cytochrome P450 2D6/2C19         Cytochrome P450 3A4/3A5     Imaging Orders         DG Lumbar Spine Complete W/Bend         DG Si Joints         DG Shoulder Right         DG Shoulder Left     Referral Orders  No referral(s) requested today   Procedure Orders    No procedure(s) ordered today   Pharmacotherapy (current): Medications ordered:  No orders of the defined types were placed in this encounter.  Medications administered during this visit: Maria Keller had no medications administered during this visit.   Pharmacological management options:  Opioid Analgesics: The patient was informed that there is no guarantee that she would be a candidate for opioid analgesics. The decision will be made following CDC guidelines. This decision will be based on the results of diagnostic studies, as well as Maria Keller's risk profile.   Membrane stabilizer: To be determined at a later time  Muscle relaxant: To be determined at a later time  NSAID: To be determined at a later time  Other analgesic(s): To be determined at a later time   Interventional management options: Maria Keller was informed that there is no guarantee that she would be a candidate for interventional therapies. The decision will be based on the results of diagnostic studies, as well as Maria Keller's risk profile.  Procedure(s) under consideration:  Pending results of ordered studies      Interventional Therapies  Risk  Complexity Considerations:   Estimated body mass index is 26.94 kg/m as calculated from the following:   Height  as of this encounter: '5\' 7"'  (1.702 m).   Weight as of this encounter: 172 lb (78 kg). WNL   Planned  Pending:   Slow tapering down of opioids from 487.5 MME/day (10/12/2021) at an estimated decrease rate of 7.5 MME/wk, should take approximately 65 weeks (apox.16.25 months) until start of "Drug Holiday".  If tapering begins on 10/21/2021, it should be completed by 01/19/2023. By 10/12/2021 the patient was taking: Oxycodone IR 5 mg tablet, 1 tab p.o. 5 times daily (#150) (filled on 09/20/2021) (37.5 MME/day); fentanyl 25 mcg/h patch every 48 hours (#15) (filled on 09/15/2021) (90 MME/day) + fentanyl 100 mcg/h patch every 48 hours (# 15) (filled on 09/12/2021) (  360 MME/day). Morphine milligram equivalents (MME):  Oxycodone IR 5 mg (7.5 MME)  Fentanyl 12.5 mcg/h patch (28.8 MME)    Under consideration:   Pending further evaluation   Completed:   None at this time   Completed by other providers:   right ultrasound-guided pes anserine bursa injection (01/13/2021) by Rosalia Hammers, DO  Right total knee replacement (03/19/2016) by Franchot Mimes, MD  Right knee arthroscopy (05/22/2013) by Franchot Mimes, MD  Midline L4-5 LESI (10/18/2014) by Dr. Mohammed Kindle    Therapeutic  Palliative (PRN) options:   None established    Provider-requested follow-up: Return for Eval-day (M,W), (F2F), 2nd Visit, (MM), for review of ordered tests.  Future Appointments  Date Time Provider [place]  10/16/2021  3:00 PM [facility] 1 [facility] None  10/16/2021  4:00 PM Schnier, Dolores Lory, MD [facility] None  12/04/2021  8:00 AM [facility]  12/04/2021 10:00 AM Milinda Pointer, MD [facility] None  12/07/2021 10:00 AM Einar Pheasant, MD [facility]  08/27/2022  8:30 AM [facility] [place] ADVISOR [facility]    Note by: Gaspar Cola, MD Date: 10/12/2021; Time: 3:21 PM

## 2021-10-12 ENCOUNTER — Encounter: Payer: Self-pay | Admitting: Pain Medicine

## 2021-10-12 ENCOUNTER — Ambulatory Visit
Admission: RE | Admit: 2021-10-12 | Discharge: 2021-10-12 | Disposition: A | Discharge status: Home / Self Care | Payer: Medicare Other | Source: Ambulatory Visit | Attending: Pain Medicine | Admitting: Pain Medicine

## 2021-10-12 ENCOUNTER — Ambulatory Visit (HOSPITAL_BASED_OUTPATIENT_CLINIC_OR_DEPARTMENT_OTHER): Payer: Medicare Other | Admitting: Pain Medicine

## 2021-10-12 ENCOUNTER — Other Ambulatory Visit: Admission: RE | Admit: 2021-10-12 | Payer: Medicare Other | Source: Ambulatory Visit | Admitting: Pain Medicine

## 2021-10-12 VITALS — BP 121/63 | HR 72 | Temp 97.3°F | Resp 16 | Ht 67.0 in | Wt 172.0 lb

## 2021-10-12 DIAGNOSIS — M19012 Primary osteoarthritis, left shoulder: Secondary | ICD-10-CM | POA: Diagnosis not present

## 2021-10-12 DIAGNOSIS — G8929 Other chronic pain: Secondary | ICD-10-CM

## 2021-10-12 DIAGNOSIS — E538 Deficiency of other specified B group vitamins: Secondary | ICD-10-CM

## 2021-10-12 DIAGNOSIS — Z79899 Other long term (current) drug therapy: Secondary | ICD-10-CM

## 2021-10-12 DIAGNOSIS — Z96659 Presence of unspecified artificial knee joint: Secondary | ICD-10-CM | POA: Insufficient documentation

## 2021-10-12 DIAGNOSIS — M25512 Pain in left shoulder: Secondary | ICD-10-CM | POA: Insufficient documentation

## 2021-10-12 DIAGNOSIS — M25561 Pain in right knee: Secondary | ICD-10-CM | POA: Insufficient documentation

## 2021-10-12 DIAGNOSIS — Z789 Other specified health status: Secondary | ICD-10-CM | POA: Insufficient documentation

## 2021-10-12 DIAGNOSIS — T84038S Mechanical loosening of other internal prosthetic joint, sequela: Secondary | ICD-10-CM

## 2021-10-12 DIAGNOSIS — G894 Chronic pain syndrome: Secondary | ICD-10-CM

## 2021-10-12 DIAGNOSIS — M47817 Spondylosis without myelopathy or radiculopathy, lumbosacral region: Secondary | ICD-10-CM | POA: Diagnosis not present

## 2021-10-12 DIAGNOSIS — M533 Sacrococcygeal disorders, not elsewhere classified: Secondary | ICD-10-CM | POA: Diagnosis not present

## 2021-10-12 DIAGNOSIS — Z96651 Presence of right artificial knee joint: Secondary | ICD-10-CM | POA: Insufficient documentation

## 2021-10-12 DIAGNOSIS — M545 Low back pain, unspecified: Secondary | ICD-10-CM

## 2021-10-12 DIAGNOSIS — T50905S Adverse effect of unspecified drugs, medicaments and biological substances, sequela: Secondary | ICD-10-CM

## 2021-10-12 DIAGNOSIS — M47816 Spondylosis without myelopathy or radiculopathy, lumbar region: Secondary | ICD-10-CM | POA: Diagnosis not present

## 2021-10-12 DIAGNOSIS — M899 Disorder of bone, unspecified: Secondary | ICD-10-CM

## 2021-10-12 DIAGNOSIS — M79652 Pain in left thigh: Secondary | ICD-10-CM

## 2021-10-12 DIAGNOSIS — Z79891 Long term (current) use of opiate analgesic: Secondary | ICD-10-CM | POA: Insufficient documentation

## 2021-10-12 DIAGNOSIS — F1190 Opioid use, unspecified, uncomplicated: Secondary | ICD-10-CM

## 2021-10-12 DIAGNOSIS — M79651 Pain in right thigh: Secondary | ICD-10-CM

## 2021-10-12 DIAGNOSIS — M25511 Pain in right shoulder: Secondary | ICD-10-CM | POA: Insufficient documentation

## 2021-10-12 DIAGNOSIS — M19011 Primary osteoarthritis, right shoulder: Secondary | ICD-10-CM | POA: Diagnosis not present

## 2021-10-12 DIAGNOSIS — M4317 Spondylolisthesis, lumbosacral region: Secondary | ICD-10-CM | POA: Diagnosis not present

## 2021-10-12 NOTE — Progress Notes (Signed)
Nursing Pain Medication Assessment:  Safety precautions to be maintained throughout the outpatient stay will include: orient to surroundings, keep bed in low position, maintain call bell within reach at all times, provide assistance with transfer out of bed and ambulation.  Medication Inspection Compliance: Pill count conducted under aseptic conditions, in front of the patient. Neither the pills nor the bottle was removed from the patient's sight at any time. Once count was completed pills were immediately returned to the patient in their original bottle.  Medication: Oxycodone IR Pill/Patch Count:  96 of 150 pills remain Pill/Patch Appearance: Markings consistent with prescribed medication Bottle Appearance: Standard pharmacy container. Clearly labeled. Filled Date: 8 / 86 / 2023 Last Medication intake:  TodaySafety precautions to be maintained throughout the outpatient stay will include: orient to surroundings, keep bed in low position, maintain call bell within reach at all times, provide assistance with transfer out of bed and ambulation.   Patient both empty boxes of fentanyl 163mg 25 mcg. Instructed to bring the used patches after she takes them off. Patient with understading.[place]

## 2021-10-13 ENCOUNTER — Other Ambulatory Visit (INDEPENDENT_AMBULATORY_CARE_PROVIDER_SITE_OTHER): Payer: Self-pay | Admitting: Nurse Practitioner

## 2021-10-13 DIAGNOSIS — M7989 Other specified soft tissue disorders: Secondary | ICD-10-CM

## 2021-10-15 DIAGNOSIS — I890 Lymphedema, not elsewhere classified: Secondary | ICD-10-CM | POA: Insufficient documentation

## 2021-10-15 DIAGNOSIS — I872 Venous insufficiency (chronic) (peripheral): Secondary | ICD-10-CM | POA: Insufficient documentation

## 2021-10-15 LAB — COMPLIANCE DRUG ANALYSIS, UR

## 2021-10-15 NOTE — Progress Notes (Signed)
MRN : [id number]  Maria Keller is a 71 y.o. (Aug 01, 1950) female who presents with chief complaint of legs swell.  History of Present Illness:   Patient is seen for evaluation of leg pain and leg swelling. The patient first noticed the swelling remotely. The swelling is associated with pain and discoloration. The pain and swelling worsens with prolonged dependency and improves with elevation. The pain is unrelated to activity.  The patient notes that in the morning the legs are significantly improved but they steadily worsened throughout the course of the day. The patient also notes a steady worsening of the discoloration in the ankle and shin area.   The patient denies claudication symptoms.  The patient denies symptoms consistent with rest pain.  The patient denies and extensive history of DJD and LS spine disease.  The patient has no had any past angiography, interventions or vascular surgery.  Elevation makes the leg symptoms better, dependency makes them much worse. There is no history of ulcerations. The patient denies any recent changes in medications.  The patient has not been wearing graduated compression.  The patient denies a history of DVT or PE. There is no prior history of phlebitis. There is no history of primary lymphedema.  No history of malignancies. No history of trauma or groin or pelvic surgery. There is no history of radiation treatment to the groin or pelvis  The patient denies amaurosis fugax or recent TIA symptoms. There are no recent neurological changes noted. The patient denies recent episodes of angina or shortness of breath   Venous duplex of the right lower extremity shows a patent deep venous system, no DVT.  No superficial reflux identified.  No outpatient medications have been marked as taking for the 10/16/21 encounter (Appointment) with Delana Meyer, Dolores Lory, MD.    Past Medical History:  Diagnosis Date   Anemia    iron deficiency and B12  deficiency   Arthritis    Bronchitis 08/2015   Chronic back pain    followed by Dr Primus Bravo   Depression    Diverticulosis    Esophageal stricture    requiring dilatation x 2   Fibromyalgia    Fibromyalgia    Fibromyalgia    Follicular lymphoma ([place])    Followed by Dr Leretha Pol, s/p chemo and XRT   Follicular lymphoma ([place])    GERD (gastroesophageal reflux disease)    GERD (gastroesophageal reflux disease)    Hemorrhoid    Hiatal hernia    History of kidney stones    Hypercholesterolemia    IBS (irritable bowel syndrome)    IBS (irritable bowel syndrome)    Nephrolithiasis    followed by Dr Bernardo Heater, s/p stents   PONV (postoperative nausea and vomiting)    Shingles outbreak 11/26/2011   Sleep apnea     Past Surgical History:  Procedure Laterality Date   APPENDECTOMY     BLADDER SURGERY  1993   BREAST BIOPSY N/A    capsule endoscopy     CHOLECYSTECTOMY  1995   COLONOSCOPY WITH PROPOFOL N/A 06/19/2017   Procedure: COLONOSCOPY WITH PROPOFOL;  Surgeon: Manya Silvas, MD;  Location[facility];  Service: Endoscopy;  Laterality: N/A;   ESOPHAGOGASTRODUODENOSCOPY (EGD) WITH PROPOFOL N/A 06/19/2017   Procedure: ESOPHAGOGASTRODUODENOSCOPY (EGD) WITH PROPOFOL;  Surgeon: Manya Silvas, MD;  Location: [facility] ENDOSCOPY;  Service: Endoscopy;  Laterality: N/A;   FRACTURE SURGERY     history of helicobacter pylori infection     JOINT REPLACEMENT  LEFT OOPHORECTOMY     LEG SURGERY Left    ORIF ANKLE FRACTURE  2012   OVARIAN CYST REMOVAL     right   RECTOCELE REPAIR N/A    REPLACEMENT TOTAL KNEE Right 03/19/2016   TONSILECTOMY/ADENOIDECTOMY WITH MYRINGOTOMY  1970   TONSILLECTOMY N/A    TUBAL LIGATION  1976   VAGINAL HYSTERECTOMY  1980's   secondary to bleeding    Social History Social History   Tobacco Use   Smoking status: Never   Smokeless tobacco: Never  Vaping Use   Vaping Use: Never used  Substance Use Topics   Alcohol use: No    Alcohol/week: 0.0 standard  drinks of alcohol   Drug use: No    Family History Family History  Problem Relation Age of Onset   Ulcers Mother    Ulcers Father    Breast cancer Other        great aunt and grandfather   Lung cancer Other        uncle   Hypertension Maternal Grandmother    Heart disease Maternal Grandmother        MI 27s   Diabetes Maternal Grandmother    Arthritis Maternal Grandmother    Diabetes Maternal Grandfather     Allergies  Allergen Reactions   Oxycodone Hcl Itching    Other reaction(s): Headache (finding)   Oxycontin [Oxycodone Hcl] Itching    Sleep Apnea   Penicillins Anaphylaxis and Itching   Chloral Hydrate Hives    Other Reaction: Not Assessed   Ciprofloxacin Itching   Ciprofloxacin Hcl Itching    She can take Levaquin   Colace [Docusate Calcium]    Methotrexate Derivatives Other (See Comments)    Questionable    Parafon Forte Dsc [Chlorzoxazone] Itching and Other (See Comments)    Other Reaction: Not Assessed   Penicillin G Hives   Tape Other (See Comments)    adhesive  tape cause redness and skin to peel   Bextra [Valdecoxib] Itching    Other reaction(s): Other (See Comments) Other Reaction: Not Assessed   Oxycodone Itching   Rofecoxib Itching    Other reaction(s): Headache (finding)     REVIEW OF SYSTEMS (Negative unless checked)  Constitutional: '[]'$ Weight loss  '[]'$ Fever  '[]'$ Chills Cardiac: '[]'$ Chest pain   '[]'$ Chest pressure   '[]'$ Palpitations   '[]'$ Shortness of breath when laying flat   '[]'$ Shortness of breath with exertion. Vascular:  '[]'$ Pain in legs with walking   '[x]'$ Pain in legs with standing  '[]'$ History of DVT   '[]'$ Phlebitis   '[x]'$ Swelling in legs   '[]'$ Varicose veins   '[]'$ Non-healing ulcers Pulmonary:   '[]'$ Uses home oxygen   '[]'$ Productive cough   '[]'$ Hemoptysis   '[]'$ Wheeze  '[]'$ COPD   '[]'$ Asthma Neurologic:  '[]'$ Dizziness   '[]'$ Seizures   '[]'$ History of stroke   '[]'$ History of TIA  '[]'$ Aphasia   '[]'$ Vissual changes   '[]'$ Weakness or numbness in arm   '[]'$ Weakness or numbness in  leg Musculoskeletal:   '[]'$ Joint swelling   '[]'$ Joint pain   '[]'$ Low back pain Hematologic:  '[]'$ Easy bruising  '[]'$ Easy bleeding   '[]'$ Hypercoagulable state   '[]'$ Anemic Gastrointestinal:  '[]'$ Diarrhea   '[]'$ Vomiting  '[x]'$ Gastroesophageal reflux/heartburn   '[]'$ Difficulty swallowing. Genitourinary:  '[]'$ Chronic kidney disease   '[]'$ Difficult urination  '[]'$ Frequent urination   '[]'$ Blood in urine Skin:  '[]'$ Rashes   '[]'$ Ulcers  Psychological:  '[]'$ History of anxiety   '[]'$  History of major depression.  Physical Examination  There were no vitals filed for this visit. There is no height or weight on  file to calculate BMI. Gen: WD/WN, NAD Head: [place]/AT, No temporalis wasting.  Ear/Nose/Throat: Hearing grossly intact, nares w/o erythema or drainage, pinna without lesions Eyes: PER, EOMI, sclera nonicteric.  Neck: Supple, no gross masses.  No JVD.  Pulmonary:  Good air movement, no audible wheezing, no use of accessory muscles.  Cardiac: RRR, precordium not hyperdynamic. Vascular:  varicosities present on the right >6 mm .  Mild venous stasis changes to the legs bilaterally.  2+ soft pitting edema.  CEAP class C4as  Vessel Right Left  Radial Palpable Palpable  Gastrointestinal: soft, non-distended. No guarding/no peritoneal signs.  Musculoskeletal: M/S 5/5 throughout.  No deformity.  Neurologic: CN 2-12 intact. Pain and light touch intact in extremities.  Symmetrical.  Speech is fluent. Motor exam as listed above. Psychiatric: Judgment intact, Mood & affect appropriate for pt's clinical situation. Dermatologic: Venous rashes no ulcers noted.  No changes consistent with cellulitis. Lymph : No lichenification or skin changes of chronic lymphedema.  CBC Lab Results  Component Value Date   WBC 6.3 02/28/2021   HGB 12.3 02/28/2021   HCT 37.8 02/28/2021   MCV 85.1 02/28/2021   PLT 181.0 02/28/2021    BMET    Component Value Date/Time   NA 140 10/12/2021 1505   NA 138 11/06/2012 1031   K 4.7 10/12/2021 1505   K 4.0  11/06/2012 1031   CL 98 10/12/2021 1505   CL 100 11/06/2012 1031   CO2 33 (H) 08/01/2021 1311   CO2 35 (H) 11/06/2012 1031   GLUCOSE 94 10/12/2021 1505   GLUCOSE 106 (H) 08/01/2021 1311   GLUCOSE 88 11/06/2012 1031   BUN 12 10/12/2021 1505   BUN 13 11/06/2012 1031   CREATININE 0.90 10/12/2021 1505   CREATININE 0.90 11/06/2012 1031   CALCIUM 9.5 10/12/2021 1505   CALCIUM 9.0 11/06/2012 1031   GFRNONAA >60 11/06/2012 1031   GFRAA >60 11/06/2012 1031   Estimated Creatinine Clearance: 60.8 mL/min (by C-G formula based on SCr of 0.9 mg/dL).  COAG No results found for: "INR", "PROTIME"  Radiology DG Shoulder Right  Result Date: 10/14/2021 CLINICAL DATA:  Bilateral shoulder pain.  No known injury. EXAM: RIGHT SHOULDER - 2+ VIEW; LEFT SHOULDER - 2+ VIEW COMPARISON:  None available FINDINGS: Right shoulder: Surgical clips noted in the right medial upper arm. Irregularity of the greater tuberosity indicative of rotator cuff tendinopathy. Mild degenerative changes of the acromioclavicular joint. Left shoulder: Irregularity of the lesser tuberosity indicative of rotator cuff tendinopathy. Mild degenerative changes of the acromioclavicular joint. No fracture or dislocation. IMPRESSION: 1. Radiographic appearance of the shoulders suggestive of rotator cuff tendinopathy. 2. Mild bilateral acromioclavicular osteoarthrosis. Electronically Signed   By: Miachel Roux M.D.   On: 10/14/2021 13:19   DG Shoulder Left  Result Date: 10/14/2021 CLINICAL DATA:  Bilateral shoulder pain.  No known injury. EXAM: RIGHT SHOULDER - 2+ VIEW; LEFT SHOULDER - 2+ VIEW COMPARISON:  None available FINDINGS: Right shoulder: Surgical clips noted in the right medial upper arm. Irregularity of the greater tuberosity indicative of rotator cuff tendinopathy. Mild degenerative changes of the acromioclavicular joint. Left shoulder: Irregularity of the lesser tuberosity indicative of rotator cuff tendinopathy. Mild degenerative changes  of the acromioclavicular joint. No fracture or dislocation. IMPRESSION: 1. Radiographic appearance of the shoulders suggestive of rotator cuff tendinopathy. 2. Mild bilateral acromioclavicular osteoarthrosis. Electronically Signed   By: Miachel Roux M.D.   On: 10/14/2021 13:19   DG Lumbar Spine Complete W/Bend  Result Date: 10/14/2021 CLINICAL DATA:  Chronic low back pain with no known injury EXAM: LUMBAR SPINE - COMPLETE WITH BENDING VIEWS COMPARISON:  11/12/2019 FINDINGS: Right upper quadrant surgical clips likely related to prior cholecystectomy. There are 5 lumbar type vertebral bodies. Mild facet degenerative changes at L4-L5 and L5-S1. Grade 1 anterolisthesis of L5 on S1 which is not significantly change with flexion or extension. Vertebral body heights are maintained. No significant disc height loss. IMPRESSION: Mild degenerative changes of the lower lumbar spine, predominately involving the facets. Electronically Signed   By: Miachel Roux M.D.   On: 10/14/2021 13:15   DG Si Joints  Result Date: 10/14/2021 CLINICAL DATA:  Chronic low back pain EXAM: BILATERAL SACROILIAC JOINTS - 3+ VIEW COMPARISON:  CT abdomen pelvis 03/17/2013 FINDINGS: Minimal symmetric spurring of the sacroiliac joints. The joint space is maintained. No fracture or dislocation.  Visualized soft tissues are normal. IMPRESSION: Minimal bilateral sacroiliac joint osteoarthrosis. Electronically Signed   By: Miachel Roux M.D.   On: 10/14/2021 13:12     Assessment/Plan 1. Lymphedema Recommend:  I have had a long discussion with the patient regarding swelling and why it  causes symptoms.    She is a CEAP class C4as  Patient will begin wearing graduated compression on a daily basis a prescription was given. The patient will  wear the stockings first thing in the morning and removing them in the evening. The patient is instructed specifically not to sleep in the stockings.   In addition, behavioral modification will be initiated.   This will include frequent elevation, use of over the counter pain medications and exercise such as walking.  Consideration for a lymph pump will also be made based upon the effectiveness of conservative therapy.  This would help to improve the edema control and prevent sequela such as ulcers and infections   Venous duplex of the right lower extremity shows a patent deep venous system, no DVT.  No superficial reflux identified.  2. Chronic venous insufficiency See #1  3. Gastro-esophageal reflux disease without esophagitis Continue PPI as already ordered, this medication has been reviewed and there are no changes at this time.  Avoidence of caffeine and alcohol  Moderate elevation of the head of the bed    4. DDD (degenerative disc disease), lumbar Continue NSAID medications as already ordered, these medications have been reviewed and there are no changes at this time.  Continued activity and therapy was stressed.   5. Pure hypercholesterolemia Continue statin as ordered and reviewed, no changes at this time   6. Right leg swelling See #1 - VAS [place] LOWER EXTREMITY VENOUS REFLUX    Hortencia Pilar, MD  10/15/2021 8:56 PM

## 2021-10-16 ENCOUNTER — Ambulatory Visit (INDEPENDENT_AMBULATORY_CARE_PROVIDER_SITE_OTHER): Payer: Medicare Other

## 2021-10-16 ENCOUNTER — Encounter (INDEPENDENT_AMBULATORY_CARE_PROVIDER_SITE_OTHER): Payer: Self-pay | Admitting: Vascular Surgery

## 2021-10-16 ENCOUNTER — Ambulatory Visit (INDEPENDENT_AMBULATORY_CARE_PROVIDER_SITE_OTHER): Payer: Medicare Other | Admitting: Vascular Surgery

## 2021-10-16 VITALS — BP 116/69 | HR 80 | Resp 18 | Ht 67.0 in | Wt 175.2 lb

## 2021-10-16 DIAGNOSIS — M7989 Other specified soft tissue disorders: Secondary | ICD-10-CM | POA: Diagnosis not present

## 2021-10-16 DIAGNOSIS — I872 Venous insufficiency (chronic) (peripheral): Secondary | ICD-10-CM | POA: Diagnosis not present

## 2021-10-16 DIAGNOSIS — E7800 Pure hypercholesterolemia, unspecified: Secondary | ICD-10-CM | POA: Diagnosis not present

## 2021-10-16 DIAGNOSIS — M5136 Other intervertebral disc degeneration, lumbar region: Secondary | ICD-10-CM | POA: Diagnosis not present

## 2021-10-16 DIAGNOSIS — M51369 Other intervertebral disc degeneration, lumbar region without mention of lumbar back pain or lower extremity pain: Secondary | ICD-10-CM

## 2021-10-16 DIAGNOSIS — I890 Lymphedema, not elsewhere classified: Secondary | ICD-10-CM | POA: Diagnosis not present

## 2021-10-16 DIAGNOSIS — K219 Gastro-esophageal reflux disease without esophagitis: Secondary | ICD-10-CM

## 2021-10-16 DIAGNOSIS — C8238 Follicular lymphoma grade IIIa, lymph nodes of multiple sites: Secondary | ICD-10-CM | POA: Diagnosis not present

## 2021-10-16 LAB — CBC AND DIFFERENTIAL
HCT: 39 (ref 36–46)
Hemoglobin: 12.9 (ref 12.0–16.0)
Platelets: 214 10*3/uL (ref 150–400)
WBC: 7.8

## 2021-10-16 LAB — BASIC METABOLIC PANEL
BUN: 11 (ref 4–21)
CO2: 34 — AB (ref 13–22)
Chloride: 101 (ref 99–108)
Creatinine: 0.8 (ref <=1.1)
Glucose: 97
Potassium: 4.4 mEq/L (ref 3.5–5.1)
Sodium: 142 (ref 137–147)

## 2021-10-16 LAB — HEPATIC FUNCTION PANEL
ALT: 21 U/L (ref 7–35)
AST: 28 (ref 13–35)
Alkaline Phosphatase: 62 (ref 25–125)
Bilirubin, Direct: 0.5 — AB
Bilirubin, Total: 0.5

## 2021-10-16 LAB — COMPREHENSIVE METABOLIC PANEL
Albumin: 4.3 (ref 3.5–5.0)
Calcium: 9.5 (ref 8.7–10.7)
eGFR: 78

## 2021-10-22 ENCOUNTER — Encounter (INDEPENDENT_AMBULATORY_CARE_PROVIDER_SITE_OTHER): Payer: Self-pay | Admitting: Vascular Surgery

## 2021-10-23 ENCOUNTER — Other Ambulatory Visit: Payer: Self-pay | Admitting: Surgery

## 2021-10-26 ENCOUNTER — Encounter
Admission: RE | Admit: 2021-10-26 | Discharge: 2021-10-26 | Disposition: A | Discharge status: Home / Self Care | Payer: Medicare Other | Source: Ambulatory Visit | Attending: Surgery | Admitting: Surgery

## 2021-10-26 ENCOUNTER — Telehealth: Payer: Self-pay | Admitting: Internal Medicine

## 2021-10-26 ENCOUNTER — Other Ambulatory Visit: Payer: Self-pay

## 2021-10-26 VITALS — BP 123/59 | HR 63 | Resp 16 | Ht 67.0 in | Wt 173.5 lb

## 2021-10-26 DIAGNOSIS — Z01812 Encounter for preprocedural laboratory examination: Secondary | ICD-10-CM

## 2021-10-26 DIAGNOSIS — Z20822 Contact with and (suspected) exposure to covid-19: Secondary | ICD-10-CM | POA: Diagnosis not present

## 2021-10-26 DIAGNOSIS — Z01810 Encounter for preprocedural cardiovascular examination: Secondary | ICD-10-CM | POA: Diagnosis not present

## 2021-10-26 LAB — URINALYSIS, ROUTINE W REFLEX MICROSCOPIC
Bacteria, UA: NONE SEEN
Bilirubin Urine: NEGATIVE
Glucose, UA: NEGATIVE mg/dL
Hgb urine dipstick: NEGATIVE
Ketones, ur: NEGATIVE mg/dL
Leukocytes,Ua: NEGATIVE
Nitrite: NEGATIVE
Protein, ur: 30 mg/dL — AB
Specific Gravity, Urine: 1.025 (ref 1.005–1.030)
pH: 6 (ref 5.0–8.0)

## 2021-10-26 LAB — SURGICAL PCR SCREEN
MRSA, PCR: NEGATIVE
Staphylococcus aureus: NEGATIVE

## 2021-10-26 LAB — TYPE AND SCREEN
ABO/RH(D): O POS
Antibody Screen: NEGATIVE

## 2021-10-26 NOTE — Patient Instructions (Addendum)
Your procedure is scheduled on: 11/07/21 - Tuesday Report to the Registration Desk on the 1st floor of the [facility]. To find out your arrival time, please call [phone] between 1PM - 3PM on: 11/06/21 - Monday If your arrival time is 6:00 am, do not arrive prior to that time as the [facility] entrance doors do not open until 6:00 am.  REMEMBER: Instructions that are not followed completely may result in serious medical risk, up to and including death; or upon the discretion of your surgeon and anesthesiologist your surgery may need to be rescheduled.  Do not eat food after midnight the night before surgery.  No gum chewing, lozengers or hard candies.  You may however, drink CLEAR liquids up to 2 hours before you are scheduled to arrive for your surgery. Do not drink anything within 2 hours of your scheduled arrival time.  Clear liquids include: - water  - apple juice without pulp - gatorade (not RED colors) - black coffee or tea (Do NOT add milk or creamers to the coffee or tea) Do NOT drink anything that is not on this list.  In addition, your doctor has ordered for you to drink the provided  Ensure Pre-Surgery Clear Carbohydrate Drink  Drinking this carbohydrate drink up to two hours before surgery helps to reduce insulin resistance and improve patient outcomes. Please complete drinking 2 hours prior to scheduled arrival time.  TAKE THESE MEDICATIONS THE MORNING OF SURGERY WITH A SIP OF WATER:  - omeprazole (PRILOSEC) - (take one the night before and one on the morning of surgery - helps to prevent nausea after surgery.) - fluticasone (FLONASE) - estradiol (ESTRACE)   One week prior to surgery: STOP ASPIRIN beginning 10/31/21. Stop Anti-inflammatories (NSAIDS) such as Advil, Aleve, Ibuprofen, Motrin, Naproxen, Naprosyn and Aspirin based products such as Excedrin, Goodys Powder, BC Powder.  Stop beginning 10/31/21, ANY OVER THE COUNTER supplements until after  surgery.  You may however, continue to take Tylenol if needed for pain up until the day of surgery.  No Alcohol for 24 hours before or after surgery.  No Smoking including e-cigarettes for 24 hours prior to surgery.  No chewable tobacco products for at least 6 hours prior to surgery.  No nicotine patches on the day of surgery.  Do not use any "recreational" drugs for at least a week prior to your surgery.  Please be advised that the combination of cocaine and anesthesia may have negative outcomes, up to and including death. If you test positive for cocaine, your surgery will be cancelled.  On the morning of surgery brush your teeth with toothpaste and water, you may rinse your mouth with mouthwash if you wish. Do not swallow any toothpaste or mouthwash.  Use CHG Soap or wipes as directed on instruction sheet.  Do not wear jewelry, make-up, hairpins, clips or nail polish.  Do not wear lotions, powders, or perfumes.   Do not shave body from the neck down 48 hours prior to surgery just in case you cut yourself which could leave a site for infection.  Also, freshly shaved skin may become irritated if using the CHG soap.  Contact lenses, hearing aids and dentures may not be worn into surgery.  Do not bring valuables to the hospital. [facility] is not responsible for any missing/lost belongings or valuables.   Notify your doctor if there is any change in your medical condition (cold, fever, infection).  Wear comfortable clothing (specific to your surgery type) to  the hospital.  After surgery, you can help prevent lung complications by doing breathing exercises.  Take deep breaths and cough every 1-2 hours. Your doctor may order a device called an Incentive Spirometer to help you take deep breaths. When coughing or sneezing, hold a pillow firmly against your incision with both hands. This is called "splinting." Doing this helps protect your incision. It also decreases belly  discomfort.  If you are being admitted to the hospital overnight, leave your suitcase in the car. After surgery it may be brought to your room.  If you are being discharged the day of surgery, you will not be allowed to drive home. You will need a responsible adult (18 years or older) to drive you home and stay with you that night.   If you are taking public transportation, you will need to have a responsible adult (18 years or older) with you. Please confirm with your physician that it is acceptable to use public transportation.   Please call the [facility] at [phone] if you have any questions about these instructions.  Surgery Visitation Policy:  Patients undergoing a surgery or procedure may have two family members or support persons with them as long as the person is not COVID-19 positive or experiencing its symptoms.   Inpatient Visitation:    Visiting hours are 7 a.m. to 8 p.m. Up to four visitors are allowed at one time in a patient room, including children. The visitors may rotate out with other people during the day. One designated support person (adult) may remain overnight.

## 2021-10-26 NOTE — Telephone Encounter (Signed)
Patient dropped off lab results for Dr Nicki Reaper. Results are up front in Dr Bary Leriche color folder.

## 2021-10-26 NOTE — Telephone Encounter (Signed)
Retrieved from folder up front and placed in Dr Nicki Reaper basket to review.

## 2021-11-01 ENCOUNTER — Other Ambulatory Visit: Payer: Self-pay | Admitting: Internal Medicine

## 2021-11-01 DIAGNOSIS — Z96659 Presence of unspecified artificial knee joint: Secondary | ICD-10-CM | POA: Diagnosis not present

## 2021-11-01 DIAGNOSIS — T84038D Mechanical loosening of other internal prosthetic joint, subsequent encounter: Secondary | ICD-10-CM | POA: Diagnosis not present

## 2021-11-02 LAB — COMP. METABOLIC PANEL (12)
AST: 27 IU/L (ref 0–40)
Albumin/Globulin Ratio: 2 (ref 1.2–2.2)
Albumin: 4.8 g/dL (ref 3.8–4.8)
Alkaline Phosphatase: 76 IU/L (ref 44–121)
BUN/Creatinine Ratio: 13 (ref 12–28)
BUN: 12 mg/dL (ref 8–27)
Bilirubin Total: 0.7 mg/dL (ref 0.0–1.2)
Calcium: 9.5 mg/dL (ref 8.7–10.3)
Chloride: 98 mmol/L (ref 96–106)
Creatinine, Ser: 0.9 mg/dL (ref 0.57–1.00)
Globulin, Total: 2.4 g/dL (ref 1.5–4.5)
Glucose: 94 mg/dL (ref 70–99)
Potassium: 4.7 mmol/L (ref 3.5–5.2)
Sodium: 140 mmol/L (ref 134–144)
Total Protein: 7.2 g/dL (ref 6.0–8.5)
eGFR: 68 mL/min/{1.73_m2} (ref >59)

## 2021-11-02 LAB — CYTOCHROME P450 3A4/3A5: 3A4 Metabolic Activity:: NORMAL

## 2021-11-02 LAB — 25-HYDROXY VITAMIN D LCMS D2+D3
25-Hydroxy, Vitamin D-2: <1 ng/mL
25-Hydroxy, Vitamin D-3: 32 ng/mL
25-Hydroxy, Vitamin D: 32 ng/mL

## 2021-11-02 LAB — CYTOCHROME P450 2D6/2C19: 2C19 Metabolic Activity:: NORMAL

## 2021-11-02 LAB — MAGNESIUM: Magnesium: 2.4 mg/dL — ABNORMAL HIGH (ref 1.6–2.3)

## 2021-11-02 LAB — VITAMIN B12: Vitamin B-12: 551 pg/mL (ref 232–1245)

## 2021-11-02 LAB — C-REACTIVE PROTEIN: CRP: 3 mg/L (ref 0–10)

## 2021-11-02 LAB — SEDIMENTATION RATE: Sed Rate: 24 mm/hr (ref 0–40)

## 2021-11-06 ENCOUNTER — Other Ambulatory Visit: Payer: Medicare Other

## 2021-11-07 ENCOUNTER — Encounter
Admission: RE | Disposition: A | Discharge status: Home Health | Payer: Self-pay | Source: Home / Self Care | Attending: Surgery

## 2021-11-07 ENCOUNTER — Inpatient Hospital Stay: Payer: Medicare Other | Admitting: Urgent Care

## 2021-11-07 ENCOUNTER — Other Ambulatory Visit: Payer: Self-pay

## 2021-11-07 ENCOUNTER — Inpatient Hospital Stay: Payer: Medicare Other | Admitting: Certified Registered"

## 2021-11-07 ENCOUNTER — Inpatient Hospital Stay
Admission: RE | Admit: 2021-11-07 | Discharge: 2021-11-08 | DRG: 468 | Disposition: A | Discharge status: Home Health | Payer: Medicare Other | Attending: Surgery | Admitting: Surgery

## 2021-11-07 ENCOUNTER — Inpatient Hospital Stay: Payer: Medicare Other

## 2021-11-07 ENCOUNTER — Encounter: Payer: Self-pay | Admitting: Surgery

## 2021-11-07 DIAGNOSIS — G4730 Sleep apnea, unspecified: Secondary | ICD-10-CM | POA: Diagnosis present

## 2021-11-07 DIAGNOSIS — E7800 Pure hypercholesterolemia, unspecified: Secondary | ICD-10-CM | POA: Diagnosis present

## 2021-11-07 DIAGNOSIS — Z8619 Personal history of other infectious and parasitic diseases: Secondary | ICD-10-CM

## 2021-11-07 DIAGNOSIS — T84032A Mechanical loosening of internal right knee prosthetic joint, initial encounter: Secondary | ICD-10-CM | POA: Diagnosis present

## 2021-11-07 DIAGNOSIS — M549 Dorsalgia, unspecified: Secondary | ICD-10-CM | POA: Diagnosis present

## 2021-11-07 DIAGNOSIS — Z23 Encounter for immunization: Secondary | ICD-10-CM

## 2021-11-07 DIAGNOSIS — M797 Fibromyalgia: Secondary | ICD-10-CM | POA: Diagnosis present

## 2021-11-07 DIAGNOSIS — K589 Irritable bowel syndrome without diarrhea: Secondary | ICD-10-CM | POA: Diagnosis present

## 2021-11-07 DIAGNOSIS — M1990 Unspecified osteoarthritis, unspecified site: Secondary | ICD-10-CM | POA: Diagnosis present

## 2021-11-07 DIAGNOSIS — Z96651 Presence of right artificial knee joint: Secondary | ICD-10-CM | POA: Diagnosis not present

## 2021-11-07 DIAGNOSIS — D509 Iron deficiency anemia, unspecified: Secondary | ICD-10-CM | POA: Diagnosis present

## 2021-11-07 DIAGNOSIS — Z79890 Hormone replacement therapy (postmenopausal): Secondary | ICD-10-CM

## 2021-11-07 DIAGNOSIS — G8929 Other chronic pain: Secondary | ICD-10-CM | POA: Diagnosis present

## 2021-11-07 DIAGNOSIS — Z8572 Personal history of non-Hodgkin lymphomas: Secondary | ICD-10-CM

## 2021-11-07 DIAGNOSIS — Z880 Allergy status to penicillin: Secondary | ICD-10-CM

## 2021-11-07 DIAGNOSIS — Z886 Allergy status to analgesic agent status: Secondary | ICD-10-CM

## 2021-11-07 DIAGNOSIS — K219 Gastro-esophageal reflux disease without esophagitis: Secondary | ICD-10-CM | POA: Diagnosis present

## 2021-11-07 DIAGNOSIS — Z9851 Tubal ligation status: Secondary | ICD-10-CM

## 2021-11-07 DIAGNOSIS — Z90721 Acquired absence of ovaries, unilateral: Secondary | ICD-10-CM

## 2021-11-07 DIAGNOSIS — Z9049 Acquired absence of other specified parts of digestive tract: Secondary | ICD-10-CM | POA: Diagnosis not present

## 2021-11-07 DIAGNOSIS — F32A Depression, unspecified: Secondary | ICD-10-CM | POA: Diagnosis present

## 2021-11-07 DIAGNOSIS — Z90710 Acquired absence of both cervix and uterus: Secondary | ICD-10-CM

## 2021-11-07 DIAGNOSIS — Y838 Other surgical procedures as the cause of abnormal reaction of the patient, or of later complication, without mention of misadventure at the time of the procedure: Secondary | ICD-10-CM | POA: Diagnosis present

## 2021-11-07 DIAGNOSIS — Y792 Prosthetic and other implants, materials and accessory orthopedic devices associated with adverse incidents: Secondary | ICD-10-CM | POA: Diagnosis present

## 2021-11-07 DIAGNOSIS — Z79899 Other long term (current) drug therapy: Secondary | ICD-10-CM | POA: Diagnosis not present

## 2021-11-07 DIAGNOSIS — Z888 Allergy status to other drugs, medicaments and biological substances status: Secondary | ICD-10-CM

## 2021-11-07 DIAGNOSIS — M25561 Pain in right knee: Secondary | ICD-10-CM | POA: Diagnosis not present

## 2021-11-07 DIAGNOSIS — D649 Anemia, unspecified: Secondary | ICD-10-CM | POA: Diagnosis not present

## 2021-11-07 DIAGNOSIS — E538 Deficiency of other specified B group vitamins: Secondary | ICD-10-CM | POA: Diagnosis present

## 2021-11-07 DIAGNOSIS — L409 Psoriasis, unspecified: Secondary | ICD-10-CM | POA: Diagnosis present

## 2021-11-07 DIAGNOSIS — Z885 Allergy status to narcotic agent status: Secondary | ICD-10-CM

## 2021-11-07 DIAGNOSIS — Z87442 Personal history of urinary calculi: Secondary | ICD-10-CM

## 2021-11-07 DIAGNOSIS — T84038A Mechanical loosening of other internal prosthetic joint, initial encounter: Secondary | ICD-10-CM | POA: Diagnosis not present

## 2021-11-07 DIAGNOSIS — Z9221 Personal history of antineoplastic chemotherapy: Secondary | ICD-10-CM | POA: Diagnosis not present

## 2021-11-07 DIAGNOSIS — Z881 Allergy status to other antibiotic agents status: Secondary | ICD-10-CM

## 2021-11-07 DIAGNOSIS — Z9109 Other allergy status, other than to drugs and biological substances: Secondary | ICD-10-CM

## 2021-11-07 HISTORY — PX: TOTAL KNEE REVISION: SHX996

## 2021-11-07 LAB — ABO/RH: ABO/RH(D): O POS

## 2021-11-07 SURGERY — TOTAL KNEE REVISION
Anesthesia: Choice | Site: Knee | Laterality: Right

## 2021-11-07 MED ORDER — KETOROLAC TROMETHAMINE 15 MG/ML IJ SOLN
15.0000 mg | Freq: Once | INTRAMUSCULAR | Status: AC
  Administered 2021-11-07: 15 mg via INTRAVENOUS

## 2021-11-07 MED ORDER — MIDAZOLAM HCL 5 MG/5ML IJ SOLN
INTRAMUSCULAR | Status: DC | PRN
  Administered 2021-11-07: 2 mg via INTRAVENOUS

## 2021-11-07 MED ORDER — ESTRADIOL 1 MG PO TABS
0.5000 mg | ORAL_TABLET | Freq: Every day | ORAL | Status: DC
  Administered 2021-11-08: 0.5 mg via ORAL
  Filled 2021-11-07: qty 0.5

## 2021-11-07 MED ORDER — VANCOMYCIN HCL IN DEXTROSE 1-5 GM/200ML-% IV SOLN: 1000.0000 mg | INTRAVENOUS | Status: AC

## 2021-11-07 MED ORDER — INFLUENZA VAC A&B SA ADJ QUAD 0.5 ML IM PRSY
0.5000 mL | PREFILLED_SYRINGE | INTRAMUSCULAR | Status: AC
  Administered 2021-11-08: 0.5 mL via INTRAMUSCULAR
  Filled 2021-11-07: qty 0.5

## 2021-11-07 MED ORDER — LACTATED RINGERS IV SOLN: INTRAVENOUS | Status: DC

## 2021-11-07 MED ORDER — AZELASTINE HCL 0.1 % NA SOLN: 1.0000 | Freq: Two times a day (BID) | NASAL | Status: DC

## 2021-11-07 MED ORDER — BUPIVACAINE HCL (PF) 0.5 % IJ SOLN
INTRAMUSCULAR | Status: AC
  Filled 2021-11-07: qty 10

## 2021-11-07 MED ORDER — FLEET ENEMA 7-19 GM/118ML RE ENEM: 1.0000 | ENEMA | Freq: Once | RECTAL | Status: DC | PRN

## 2021-11-07 MED ORDER — SODIUM CHLORIDE 0.9 % IR SOLN
Status: DC | PRN
  Administered 2021-11-07: 3000 mL

## 2021-11-07 MED ORDER — BISACODYL 10 MG RE SUPP: 10.0000 mg | Freq: Every day | RECTAL | Status: DC | PRN

## 2021-11-07 MED ORDER — APIXABAN 2.5 MG PO TABS
2.5000 mg | ORAL_TABLET | Freq: Two times a day (BID) | ORAL | Status: DC
  Administered 2021-11-08: 2.5 mg via ORAL
  Filled 2021-11-07: qty 1

## 2021-11-07 MED ORDER — EPHEDRINE 5 MG/ML INJ
INTRAVENOUS | Status: AC
  Filled 2021-11-07: qty 5

## 2021-11-07 MED ORDER — BUPIVACAINE-EPINEPHRINE (PF) 0.5% -1:200000 IJ SOLN
INTRAMUSCULAR | Status: AC
  Filled 2021-11-07: qty 30

## 2021-11-07 MED ORDER — CHLORHEXIDINE GLUCONATE 0.12 % MT SOLN
OROMUCOSAL | Status: AC
  Administered 2021-11-07: 15 mL via OROMUCOSAL
  Filled 2021-11-07: qty 15

## 2021-11-07 MED ORDER — DOCUSATE SODIUM 100 MG PO CAPS
100.0000 mg | ORAL_CAPSULE | Freq: Two times a day (BID) | ORAL | Status: DC
  Administered 2021-11-07 – 2021-11-08 (×2): 100 mg via ORAL
  Filled 2021-11-07 (×2): qty 1

## 2021-11-07 MED ORDER — ACETAMINOPHEN 325 MG PO TABS: 325.0000 mg | ORAL_TABLET | Freq: Four times a day (QID) | ORAL | Status: DC | PRN

## 2021-11-07 MED ORDER — SODIUM CHLORIDE FLUSH 0.9 % IV SOLN
INTRAVENOUS | Status: AC
  Filled 2021-11-07: qty 40

## 2021-11-07 MED ORDER — PANTOPRAZOLE SODIUM 40 MG PO TBEC
40.0000 mg | DELAYED_RELEASE_TABLET | Freq: Every day | ORAL | Status: DC
  Administered 2021-11-08: 40 mg via ORAL
  Filled 2021-11-07: qty 1

## 2021-11-07 MED ORDER — MIDAZOLAM HCL 2 MG/2ML IJ SOLN
INTRAMUSCULAR | Status: AC
  Filled 2021-11-07: qty 2

## 2021-11-07 MED ORDER — FLUTICASONE PROPIONATE 50 MCG/ACT NA SUSP: 2.0000 | Freq: Every day | NASAL | Status: DC

## 2021-11-07 MED ORDER — PROPOFOL 500 MG/50ML IV EMUL
INTRAVENOUS | Status: DC | PRN
  Administered 2021-11-07: 75 ug/kg/min via INTRAVENOUS

## 2021-11-07 MED ORDER — VANCOMYCIN HCL IN DEXTROSE 1-5 GM/200ML-% IV SOLN
1000.0000 mg | Freq: Two times a day (BID) | INTRAVENOUS | Status: AC
  Administered 2021-11-07: 1000 mg via INTRAVENOUS
  Filled 2021-11-07: qty 200

## 2021-11-07 MED ORDER — MAGNESIUM HYDROXIDE 400 MG/5ML PO SUSP
30.0000 mL | Freq: Every day | ORAL | Status: DC | PRN
  Administered 2021-11-07 – 2021-11-08 (×2): 30 mL via ORAL
  Filled 2021-11-07 (×2): qty 30

## 2021-11-07 MED ORDER — FENTANYL 100 MCG/HR TD PT72: 1.0000 | MEDICATED_PATCH | TRANSDERMAL | Status: DC

## 2021-11-07 MED ORDER — FENTANYL 100 MCG/HR TD PT72
1.0000 | MEDICATED_PATCH | TRANSDERMAL | Status: DC
  Administered 2021-11-08: 1 via TRANSDERMAL
  Filled 2021-11-07: qty 1

## 2021-11-07 MED ORDER — FENTANYL CITRATE (PF) 100 MCG/2ML IJ SOLN: 25.0000 ug | INTRAMUSCULAR | Status: DC | PRN

## 2021-11-07 MED ORDER — TRANEXAMIC ACID 1000 MG/10ML IV SOLN
INTRAVENOUS | Status: DC | PRN
  Administered 2021-11-07: 1000 mg via TOPICAL

## 2021-11-07 MED ORDER — BUPIVACAINE HCL (PF) 0.5 % IJ SOLN
INTRAMUSCULAR | Status: DC | PRN
  Administered 2021-11-07: 3 mL

## 2021-11-07 MED ORDER — SENNOSIDES-DOCUSATE SODIUM 8.6-50 MG PO TABS
1.0000 | ORAL_TABLET | Freq: Every day | ORAL | Status: DC
  Administered 2021-11-08: 1 via ORAL
  Filled 2021-11-07: qty 1

## 2021-11-07 MED ORDER — LORATADINE 10 MG PO TABS
10.0000 mg | ORAL_TABLET | Freq: Every day | ORAL | Status: DC
  Administered 2021-11-07 – 2021-11-08 (×2): 10 mg via ORAL
  Filled 2021-11-07 (×2): qty 1

## 2021-11-07 MED ORDER — TRANEXAMIC ACID 1000 MG/10ML IV SOLN
INTRAVENOUS | Status: AC
  Filled 2021-11-07: qty 10

## 2021-11-07 MED ORDER — BUPIVACAINE HCL (PF) 0.5 % IJ SOLN
INTRAMUSCULAR | Status: AC
  Filled 2021-11-07: qty 30

## 2021-11-07 MED ORDER — EPHEDRINE SULFATE (PRESSORS) 50 MG/ML IJ SOLN
INTRAMUSCULAR | Status: DC | PRN
  Administered 2021-11-07: 15 mg via INTRAVENOUS
  Administered 2021-11-07: 7.5 mg via INTRAVENOUS

## 2021-11-07 MED ORDER — ONDANSETRON HCL 4 MG/2ML IJ SOLN: 4.0000 mg | Freq: Four times a day (QID) | INTRAMUSCULAR | Status: DC | PRN

## 2021-11-07 MED ORDER — PHENYLEPHRINE 80 MCG/ML (10ML) SYRINGE FOR IV PUSH (FOR BLOOD PRESSURE SUPPORT)
PREFILLED_SYRINGE | INTRAVENOUS | Status: DC | PRN
  Administered 2021-11-07: 80 ug via INTRAVENOUS

## 2021-11-07 MED ORDER — ATORVASTATIN CALCIUM 10 MG PO TABS
10.0000 mg | ORAL_TABLET | Freq: Every day | ORAL | Status: DC
  Administered 2021-11-07: 10 mg via ORAL
  Filled 2021-11-07 (×2): qty 1

## 2021-11-07 MED ORDER — ONDANSETRON HCL 4 MG PO TABS: 4.0000 mg | ORAL_TABLET | Freq: Four times a day (QID) | ORAL | Status: DC | PRN

## 2021-11-07 MED ORDER — BUPIVACAINE LIPOSOME 1.3 % IJ SUSP
INTRAMUSCULAR | Status: AC
  Filled 2021-11-07: qty 20

## 2021-11-07 MED ORDER — VITAMIN D 25 MCG (1000 UNIT) PO TABS
1000.0000 [IU] | ORAL_TABLET | Freq: Every day | ORAL | Status: DC
  Administered 2021-11-07 – 2021-11-08 (×2): 1000 [IU] via ORAL
  Filled 2021-11-07 (×2): qty 1

## 2021-11-07 MED ORDER — TRIAMCINOLONE ACETONIDE 40 MG/ML IJ SUSP
INTRAMUSCULAR | Status: AC
  Filled 2021-11-07: qty 2

## 2021-11-07 MED ORDER — VANCOMYCIN HCL IN DEXTROSE 1-5 GM/200ML-% IV SOLN
INTRAVENOUS | Status: AC
  Administered 2021-11-07: 1000 mg via INTRAVENOUS
  Filled 2021-11-07: qty 200

## 2021-11-07 MED ORDER — FUROSEMIDE 20 MG PO TABS
20.0000 mg | ORAL_TABLET | Freq: Every day | ORAL | Status: DC
  Administered 2021-11-07 – 2021-11-08 (×2): 20 mg via ORAL
  Filled 2021-11-07 (×2): qty 1

## 2021-11-07 MED ORDER — PHENYLEPHRINE HCL-NACL 20-0.9 MG/250ML-% IV SOLN
INTRAVENOUS | Status: DC | PRN
  Administered 2021-11-07: 25 ug/min via INTRAVENOUS

## 2021-11-07 MED ORDER — CHLORHEXIDINE GLUCONATE 0.12 % MT SOLN: 15.0000 mL | Freq: Once | OROMUCOSAL | Status: AC

## 2021-11-07 MED ORDER — PHENYLEPHRINE HCL (PRESSORS) 10 MG/ML IV SOLN
INTRAVENOUS | Status: AC
  Filled 2021-11-07: qty 1

## 2021-11-07 MED ORDER — FENTANYL 25 MCG/HR TD PT72: 1.0000 | MEDICATED_PATCH | TRANSDERMAL | Status: DC

## 2021-11-07 MED ORDER — CALCIUM CARBONATE ANTACID 500 MG PO CHEW
1250.0000 mg | CHEWABLE_TABLET | Freq: Every day | ORAL | Status: DC
  Filled 2021-11-07: qty 7

## 2021-11-07 MED ORDER — ADULT MULTIVITAMIN W/MINERALS CH
1.0000 | ORAL_TABLET | Freq: Every day | ORAL | Status: DC
  Administered 2021-11-08: 1 via ORAL
  Filled 2021-11-07: qty 1

## 2021-11-07 MED ORDER — DIPHENHYDRAMINE HCL 12.5 MG/5ML PO ELIX
12.5000 mg | ORAL_SOLUTION | ORAL | Status: DC | PRN
  Administered 2021-11-08: 25 mg via ORAL
  Filled 2021-11-07: qty 10

## 2021-11-07 MED ORDER — HYDROMORPHONE HCL 1 MG/ML IJ SOLN: 0.2500 mg | INTRAMUSCULAR | Status: DC | PRN

## 2021-11-07 MED ORDER — METOCLOPRAMIDE HCL 5 MG/ML IJ SOLN: 5.0000 mg | Freq: Three times a day (TID) | INTRAMUSCULAR | Status: DC | PRN

## 2021-11-07 MED ORDER — OXYCODONE HCL 5 MG PO TABS
5.0000 mg | ORAL_TABLET | ORAL | Status: DC | PRN
  Administered 2021-11-07 – 2021-11-08 (×5): 10 mg via ORAL
  Filled 2021-11-07 (×5): qty 2

## 2021-11-07 MED ORDER — PROPOFOL 1000 MG/100ML IV EMUL
INTRAVENOUS | Status: AC
  Filled 2021-11-07: qty 100

## 2021-11-07 MED ORDER — TRIAMCINOLONE ACETONIDE 40 MG/ML IJ SUSP
INTRAMUSCULAR | Status: DC | PRN
  Administered 2021-11-07: 93 mL

## 2021-11-07 MED ORDER — 0.9 % SODIUM CHLORIDE (POUR BTL) OPTIME
TOPICAL | Status: DC | PRN
  Administered 2021-11-07: 500 mL

## 2021-11-07 MED ORDER — FENTANYL 25 MCG/HR TD PT72
1.0000 | MEDICATED_PATCH | TRANSDERMAL | Status: DC
  Administered 2021-11-08: 1 via TRANSDERMAL
  Filled 2021-11-07: qty 1

## 2021-11-07 MED ORDER — PROPOFOL 10 MG/ML IV BOLUS
INTRAVENOUS | Status: AC
  Filled 2021-11-07: qty 60

## 2021-11-07 MED ORDER — ACETAMINOPHEN 500 MG PO TABS
1000.0000 mg | ORAL_TABLET | Freq: Four times a day (QID) | ORAL | Status: AC
  Administered 2021-11-07 – 2021-11-08 (×4): 1000 mg via ORAL
  Filled 2021-11-07 (×4): qty 2

## 2021-11-07 MED ORDER — KETOROLAC TROMETHAMINE 15 MG/ML IJ SOLN
7.5000 mg | Freq: Four times a day (QID) | INTRAMUSCULAR | Status: AC
  Administered 2021-11-07 – 2021-11-08 (×3): 7.5 mg via INTRAVENOUS
  Filled 2021-11-07 (×3): qty 1

## 2021-11-07 MED ORDER — METOCLOPRAMIDE HCL 5 MG PO TABS: 5.0000 mg | ORAL_TABLET | Freq: Three times a day (TID) | ORAL | Status: DC | PRN

## 2021-11-07 MED ORDER — ORAL CARE MOUTH RINSE: 15.0000 mL | Freq: Once | OROMUCOSAL | Status: AC

## 2021-11-07 MED ORDER — KETOROLAC TROMETHAMINE 15 MG/ML IJ SOLN
INTRAMUSCULAR | Status: AC
  Filled 2021-11-07: qty 1

## 2021-11-07 MED ORDER — SODIUM CHLORIDE 0.9 % IV SOLN: INTRAVENOUS | Status: DC

## 2021-11-07 SURGICAL SUPPLY — 72 items
BLADE FLEX CHISEL 8 2.8 (MISCELLANEOUS) IMPLANT
BLADE SAW SAG 25X90X1.19 (BLADE) ×1 IMPLANT
BLADE SAW SGTL 13X75X1.27 (BLADE) IMPLANT
BNDG COHESIVE 6X5 TAN ST LF (GAUZE/BANDAGES/DRESSINGS) ×1 IMPLANT
BNDG ELASTIC 6X5.8 VLCR STR LF (GAUZE/BANDAGES/DRESSINGS) ×1 IMPLANT
CEMENT BONE R 1X40 (Cement) ×2 IMPLANT
CEMENT VACUUM MIXING SYSTEM (MISCELLANEOUS) ×1 IMPLANT
CHLORAPREP W/TINT 26 (MISCELLANEOUS) ×1 IMPLANT
COMP FEM REV CMT 5 RT (Joint) ×1 IMPLANT
COMP TIB FIXED CMT E RT (Joint) ×1 IMPLANT
COMPONENT FEM REV CMT 5 RT (Joint) IMPLANT
COMPONENT TIB FIXED CMT E RT (Joint) IMPLANT
COOLER POLAR GLACIER W/PUMP (MISCELLANEOUS) ×1 IMPLANT
CUFF TOURN SGL QUICK 24 (TOURNIQUET CUFF)
CUFF TOURN SGL QUICK 34 (TOURNIQUET CUFF)
CUFF TRNQT CYL 24X4X16.5-23 (TOURNIQUET CUFF) IMPLANT
CUFF TRNQT CYL 34X4.125X (TOURNIQUET CUFF) IMPLANT
DRAPE IMP U-DRAPE 54X76 (DRAPES) ×1 IMPLANT
DRAPE SURG 17X11 SM STRL (DRAPES) ×2 IMPLANT
DRSG MEPILEX SACRM 8.7X9.8 (GAUZE/BANDAGES/DRESSINGS) ×1 IMPLANT
DRSG OPSITE POSTOP 4X14 (GAUZE/BANDAGES/DRESSINGS) ×1 IMPLANT
ELECT CAUTERY BLADE 6.4 (BLADE) IMPLANT
ELECT REM PT RETURN 9FT ADLT (ELECTROSURGICAL) ×1
ELECTRODE REM PT RTRN 9FT ADLT (ELECTROSURGICAL) ×1 IMPLANT
GLOVE BIO SURGEON STRL SZ7.5 (GLOVE) ×4 IMPLANT
GLOVE BIO SURGEON STRL SZ8 (GLOVE) ×4 IMPLANT
GLOVE BIOGEL PI IND STRL 8 (GLOVE) ×1 IMPLANT
GLOVE SURG UNDER LTX SZ8 (GLOVE) ×1 IMPLANT
GOWN STRL REUS W/ TWL LRG LVL3 (GOWN DISPOSABLE) ×1 IMPLANT
GOWN STRL REUS W/ TWL XL LVL3 (GOWN DISPOSABLE) ×1 IMPLANT
GOWN STRL REUS W/TWL LRG LVL3 (GOWN DISPOSABLE) ×1
GOWN STRL REUS W/TWL XL LVL3 (GOWN DISPOSABLE) ×1
HDLS TROCR DRIL PIN KNEE 75 (PIN) ×1
HOOD PEEL AWAY FLYTE STAYCOOL (MISCELLANEOUS) ×3 IMPLANT
IMMBOLIZER KNEE 19 BLUE UNIV (SOFTGOODS) ×1 IMPLANT
INSERT TIB BEAR EF3-5 12 RT (Insert) IMPLANT
INSERT TIB PS CMTLS CC XSM (Insert) IMPLANT
INSTR SCRW HEX REV FIX 3.5X48 (ORTHOPEDIC DISPOSABLE SUPPLIES) ×1
INSTRUMENT SCRW HEX REV 3.5X48 (ORTHOPEDIC DISPOSABLE SUPPLIES) IMPLANT
IV NS IRRIG 3000ML ARTHROMATIC (IV SOLUTION) ×1 IMPLANT
KIT TURNOVER KIT A (KITS) ×1 IMPLANT
MANIFOLD NEPTUNE II (INSTRUMENTS) ×1 IMPLANT
NDL SPNL 20GX3.5 QUINCKE YW (NEEDLE) ×1 IMPLANT
NEEDLE SPNL 20GX3.5 QUINCKE YW (NEEDLE) ×1 IMPLANT
NS IRRIG 500ML POUR BTL (IV SOLUTION) ×1 IMPLANT
OSTEOTOME THIN 10 5 (MISCELLANEOUS) IMPLANT
OSTEOTOME THIN 8 3 (MISCELLANEOUS) IMPLANT
PACK TOTAL KNEE (MISCELLANEOUS) ×1 IMPLANT
PAD WRAPON POLAR KNEE (MISCELLANEOUS) ×1 IMPLANT
PENCIL SMOKE EVACUATOR (MISCELLANEOUS) ×1 IMPLANT
PIN DRILL HDLS TROCAR 75 4PK (PIN) IMPLANT
PULSAVAC PLUS IRRIG FAN TIP (DISPOSABLE) ×1
SCREW HEX HEADED 3.5X27 DISP (ORTHOPEDIC DISPOSABLE SUPPLIES) IMPLANT
STAPLER SKIN PROX 35W (STAPLE) ×1 IMPLANT
STEM EXT FEM 135L 12D 6 (Stem) IMPLANT
STEM FEM 135L 14D 3 (Stem) IMPLANT
SUCTION FRAZIER HANDLE 10FR (MISCELLANEOUS) ×1
SUCTION TUBE FRAZIER 10FR DISP (MISCELLANEOUS) ×1 IMPLANT
SUT VIC AB 0 CT1 36 (SUTURE) ×4 IMPLANT
SUT VIC AB 2-0 CT1 27 (SUTURE) ×4
SUT VIC AB 2-0 CT1 TAPERPNT 27 (SUTURE) ×4 IMPLANT
SWAB CULTURE AMIES ANAERIB BLU (MISCELLANEOUS) IMPLANT
SYR 10ML LL (SYRINGE) ×1 IMPLANT
SYR 20ML LL LF (SYRINGE) ×1 IMPLANT
SYR 30ML LL (SYRINGE) ×2 IMPLANT
TIP FAN IRRIG PULSAVAC PLUS (DISPOSABLE) ×1 IMPLANT
TRAP FLUID SMOKE EVACUATOR (MISCELLANEOUS) ×1 IMPLANT
TRAY FOLEY SLVR 16FR LF STAT (SET/KITS/TRAYS/PACK) ×1 IMPLANT
TUBING CONNECTING 10 (TUBING) IMPLANT
WATER STERILE IRR 500ML POUR (IV SOLUTION) ×1 IMPLANT
WEDGE FEM DIST PERS KNEE 5.5 (Joint) IMPLANT
WRAPON POLAR PAD KNEE (MISCELLANEOUS) ×1

## 2021-11-07 NOTE — H&P (Signed)
History of Present Illness: Maria Keller is a 71 y.o. who presents today for a history and physical. She is to undergo a revision of the right knee on 11/07/2021. Patient was last seen in the clinic on 06/12/2021. There has been no change in her condition since that time. Patient states that the pain has increased to the point that is significantly interfering with her activities of daily living she wishes to proceed with surgery.  Patient underwent a right total knee arthroplasty performed by Dr. Leanor Kail in 2018. She had done well up until the end of October when she started limping when she was ambulating. She was noted to have some swelling to the knee. She does not recall any specific injury or trauma that this may have precipitated the onset. She has been taking anti-inflammatory medications as well as using ice/compression as well as elevation. Been taking Tylenol. She is on chronic medications of fentanyl and oxycodone for her back. She had an MRI which did not show any discrete soft tissue mass, fluid collection or hematoma but there was susceptibility artifact partially obscures the proximal tibia and fibula. Patient continued to have pain to the knee and subsequently a bone scan was performed which did show indication of loosening of the prosthesis. She subsequently is scheduled to undergo revision.  Past Medical History: Arthritis  Chronic back pain  Depression  Diverticulosis  Encounter for blood transfusion  Fibromyalgia  Follicular lymphoma (CMS-HCC)  GERD (gastroesophageal reflux disease)  Hemorrhoids  Hiatal hernia  History of Helicobacter pylori infection  History of kidney stones  Iron deficiency anemia  Osteoarthritis  Personal history of other specified diseases(V13.89)  PONV (postoperative nausea and vomiting)  Psoriasis  Sleep apnea (was brought on by use of Roxicodone/ reaction to medication)  Sore throat (long uvula gets caught on ETT)   Past Surgical  History Past Surgical History:  FLEXIBLE SIGMOIDOSCOPY 04/12/1995  COLONOSCOPY 05/16/1995 (Normal Colon)  BREAST BIOPSY Right 1998  COLONOSCOPY 02/12/2001 (Int Hemorrhoids, Diverticulosis)  CAPSULE ENDOSCOPY 10/07/2008  COLPORRHAPHY FOR REPAIR RECTOCELE POSTERIOR N/A 11/02/2013  Procedure: COLPORRHAPHY FOR REPAIR RECTOCELE POSTERIOR; Surgeon: Colletta Maryland, MD; Location: [facility] OR; Service: Gynecology; Laterality: N/A;  JOINT REPLACEMENT Right 03/19/2016 (mako)  COLONOSCOPY 06/19/2017  Int Hemorrhoids, FHCP (Brother) CBF 06/2022  EGD 06/19/2017 (Gastritis: No repeat per RTE)  APPENDECTOMY  BREAST SURGERY (breast Bx x2)  CHOLECYSTECTOMY  COLONOSCOPY 03/10/2012, 06/27/2009, 09/29/2008, 02/03/2007  FH Colon Polyps (Brother): CBF 03/2017; Recall Ltr mailed 02/14/2017 (dh)  EGD 03/10/2012, 09/29/2008, 02/03/2007, 12/18/2004, 02/12/2001, 04/21/1997, 04/17/1995, 08/31/1992 (No repeat per RTE)  HYSTERECTOMY  KNEE ARTHROSCOPY  OOPHORECTOMY Left  REPAIR ENTEROCELE VAGINAL  REPAIR RECTOCELE  S/P MMK of the bladder  S/P Tubal ligation  TONSILLECTOMY AND ADENOIDECTOMY  UPPER GASTROINTESTINAL ENDOSCOPY   Past Family History Diabetes type II Father  High blood pressure (Hypertension) Father  Breast cancer Maternal Aunt  Lung cancer Paternal Aunt  Heart disease Maternal Grandmother  Stroke Maternal Grandmother  Coronary Artery Disease (Blocked arteries around heart) Maternal Grandmother  Diabetes Maternal Grandmother  Myocardial Infarction (Heart attack) Maternal Grandmother  High blood pressure (Hypertension) Maternal Grandmother  Lung cancer Maternal Grandfather  Colon polyps Maternal Grandfather  Cancer Maternal Grandfather  Breast cancer Maternal Grandfather  High blood pressure (Hypertension) Brother  Diabetes type II Brother  Heart disease Brother  Colon polyps Brother  Cancer Brother  Colon polyps Brother  Breast cancer Other  Anesthesia problems Neg Hx  Malignant  hyperthermia Neg Hx  Ovarian cancer Neg Hx  Medications: acetaminophen (TYLENOL) 650 MG ER tablet Take 650 mg by mouth every 8 (eight) hours as needed for Pain  atorvastatin (LIPITOR) 10 MG tablet Take one tablet q Monday, Wednesday and Friday  azelastine (ASTELIN) 137 mcg nasal spray Place 2 sprays into both nostrils 2 (two) times daily  calcium carbonate (CALCIUM 500 ORAL) Take 1 tablet by mouth once daily  cetirizine (ZYRTEC) 10 MG tablet Take 10 mg by mouth daily.  cholecalciferol (VITAMIN D3) 1000 unit tablet Take 1,000 Units by mouth once daily  clindamycin (CLEOCIN) 300 MG capsule Take 2 capsules (600 mg total) by mouth as directed. One hour before procedure 2 capsule 0  estradioL (ESTRACE) 0.5 MG tablet TAKE ONE TABLET EVERY DAY  fentaNYL (DURAGESIC) 100 mcg/hr patch Place 1 patch onto the skin every other day. Use with fentanyl 25 mcg patch to equal 125 mcg fentaNYL (DURAGESIC) 25 mcg/hr patch Place 1 patch onto the skin every other day. Use with fentanyl 100 mcg patch to = 125 mcg fluticasone (FLONASE) 50 mcg/actuation nasal spray Place 1 spray into both nostrils daily.  FUROsemide (LASIX) 20 MG tablet TAKE ONE TABLET BY MOUTH EVERY DAY AS NEEDED  lidocaine (LIDODERM) 5 % patch Place 1 patch onto the skin as needed  lidocaine-diphenhydramine-aluminum-magnesium-simethicone (FIRST MOUTHWASH BLM) oral suspension Swish and spit 5 mLs 3 (three) times daily as needed 237 mL 0  multivitamin tablet Take 1 tablet by mouth once daily  naloxone (NARCAN) 4 mg/actuation nasal spray Place 4 mg into one nostril once  omeprazole (PRILOSEC) 20 MG DR capsule TAKE 1 CAPSULE TWICE DAILY  oxyCODONE (ROXICODONE) 5 MG immediate release tablet Take 1 tablet (5 mg total) by mouth every 6 (six) hours as needed for Pain. 40 tablet 0  polyethylene glycol (MIRALAX) powder Take 17 g by mouth 2 (two) times daily Mix in 4-8ounces of fluid prior to taking.  sennosides-docusate (SENOKOT-S) 8.6-50 mg tablet Take 1  tablet by mouth once daily  acetaminophen (TYLENOL) 325 MG tablet Take 650 mg by mouth every 4 (four) hours as needed for Pain.  aspirin 81 MG EC tablet Take 81 mg by mouth once daily. (Patient not taking: Reported on 11/01/2021)  tiZANidine (ZANAFLEX) 2 MG tablet  VITAMIN D3 ORAL Take 1 capsule by mouth once daily   Allergies: Penicillins Anaphylaxis  Adhesive Other (adhesive tape cause redness and skin to peel) Bextra [Valdecoxib] Itching  Chloral Hydrate Analogues Itching (Patient thinks she had itching but unsure)  Ciprofloxacin Itching  Docusate Calcium Other (See Comments)  Methotrexate Other (Questionable) Oxycontin [Oxycodone] Itching and Other (Caused severe sleep apnea)  Parafon Forte Itching  Vioxx [Rofecoxib] Itching   Review of Systems A comprehensive 14 point ROS was performed, reviewed, and the pertinent orthopaedic findings are documented in the HPI.  Physical Exam: BP 110/60 (BP Location: Left upper arm, Patient Position: Sitting, BP Cuff Size: Adult)  Ht 170.2 cm ('5\' 7"'$ )  Wt 77.3 kg (170 lb 6.4 oz)  BMI 26.69 kg/m   General: Well-developed well-nourished female seen in no acute distress.   HEENT: Atraumatic,normocephalic. Pupils are equal and reactive to light. Oropharynx is clear with moist mucosa  Lungs: Clear to auscultation bilaterally   Cardiovascular: Regular rate and rhythm. Normal S1, S2. No murmurs. No appreciable gallops or rubs. Peripheral pulses are palpable.  Abdomen: Soft, non-tender, nondistended. Bowel sounds present  Right knee exam: GAIT: Mild limp, but does not use any assistive devices. ALIGNMENT: Normal SKIN: Well-healed anterior midline surgical incision, otherwise unremarkable SWELLING: Minimal residual swelling  over the anteromedial aspect of the proximal tibia without palpable discrete fluid collection. EFFUSION: At most a trace effusion WARMTH: None TENDERNESS: Mildly tender along medial knee, minimally tender  laterally ROM: 0-120 degrees without pain McMURRAY'S: Not evaluated PATELLOFEMORAL: Normal tracking with no peri-patellar tenderness and negative apprehension sign CREPITUS: None LACHMAN'S: Not evaluated PIVOT SHIFT: Not evaluated ANTERIOR DRAWER: Trace-1+ POSTERIOR DRAWER: Not tested today VARUS/VALGUS: Mild laxity with varus and valgus stressing.  Neurological: The patient is alert and oriented Sensation to light touch appears to be intact and within normal limits Gross motor strength appeared to be equal to 5/5  Vascular : Peripheral pulses felt to be palpable. 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  Impression: 1. 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 In addition, patient educational materials were provided regarding the diagnosis and treatment options. The patient is quite frustrated by her symptoms and functional limitations, and is ready to consider more aggressive treatment options. Therefore, I have recommended a surgical procedure, specifically a revision right total knee arthroplasty. The procedure was discussed with the patient, as were the potential risks (including bleeding, infection, nerve and/or blood vessel injury, persistent or recurrent pain, loosening and/or failure of the components, dislocation, need for further surgery, blood clots, strokes, heart attacks and/or arhythmias, pneumonia, etc.) and benefits. The patient states her understanding and wishes to proceed. A formal written consent will be obtained by the nursing staff. She will follow-up in 10 to 14 days postoperatively.   H&P reviewed and patient re-examined. No  changes.

## 2021-11-07 NOTE — Anesthesia Postprocedure Evaluation (Signed)
Anesthesia Post Note  Patient: Maria Keller  Procedure(s) Performed: TOTAL KNEE REVISION (Right: Knee)  Patient location during evaluation: PACU Anesthesia Type: Combined General/Spinal Level of consciousness: awake and alert Pain management: pain level controlled Vital Signs Assessment: post-procedure vital signs reviewed and stable Respiratory status: spontaneous breathing, nonlabored ventilation, respiratory function stable and patient connected to nasal cannula oxygen Cardiovascular status: blood pressure returned to baseline and stable Postop Assessment: no apparent nausea or vomiting Anesthetic complications: no   No notable events documented.   Last Vitals:  Vitals:   11/07/21 1412 11/07/21 1415  BP: (!) 109/57 99/63  Pulse: 85 82  Resp: 10 17  Temp:    SpO2: 91% 93%    Last Pain:  Vitals:   11/07/21 1415  PainSc: 0-No pain                 Dimas Millin

## 2021-11-07 NOTE — Anesthesia Procedure Notes (Signed)
Spinal  Patient location during procedure: OR Start time: 11/07/2021 10:39 AM End time: 11/07/2021 10:44 AM Reason for block: surgical anesthesia Staffing Performed: resident/CRNA  Anesthesiologist: Dimas Millin, MD Resident/CRNA: Fredderick Phenix, CRNA Performed by: Fredderick Phenix, CRNA Authorized by: Dimas Millin, MD   Preanesthetic Checklist Completed: patient identified, IV checked, site marked, risks and benefits discussed, surgical consent, monitors and equipment checked, pre-op evaluation and timeout performed Spinal Block Patient position: sitting Prep: DuraPrep Patient monitoring: heart rate, cardiac monitor, continuous pulse ox and blood pressure Approach: midline Location: L3-4 Injection technique: single-shot Needle Needle type: Sprotte  Needle gauge: 24 G Needle length: 9 cm Assessment Sensory level: T4 Events: CSF return

## 2021-11-07 NOTE — Evaluation (Signed)
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 PMH includes: chronic back pain, fibromyalgia, depression, follicular lymphoma, OA, and sleep apnea.   Clinical Impression  Pt was pleasant and motivated to participate during the session and put forth good effort throughout. Pt required no physical assistance with bed mobility or transfers but once in standing experienced min dizziness that progress steadily to moderate dizziness, pt returned to supine.  CNA assisted with taking supine BP which was WNL with pt's symptoms quickly improving.  Pt is expected to make good progress while in acute care once dizziness in standing resolves and will benefit from HHPT upon discharge to safely address deficits listed in patient problem list for decreased caregiver assistance and eventual return to PLOF.          Recommendations for follow up therapy are one component of a multi-disciplinary discharge planning process, led by the attending physician.  Recommendations may be updated based on patient status, additional functional criteria and insurance authorization.  Follow Up Recommendations Home health PT      Assistance Recommended at Discharge Intermittent Supervision/Assistance  Patient can return home with the following  A little help with walking and/or transfers;A little help with bathing/dressing/bathroom;Assistance with cooking/housework;Help with stairs or ramp for entrance;Assist for transportation    Equipment Recommendations Rolling walker (2 wheels);BSC/3in1  Recommendations for Other Services       Functional Status Assessment Patient has had a recent decline in their functional status and demonstrates the ability to make significant improvements in function in a  reasonable and predictable amount of time.     Precautions / Restrictions Precautions Precautions: Fall Restrictions Weight Bearing Restrictions: Yes RLE Weight Bearing: Weight bearing as tolerated      Mobility  Bed Mobility Overal bed mobility: Modified Independent             General bed mobility comments: Min extra time and effort only    Transfers Overall transfer level: Needs assistance Equipment used: Rolling walker (2 wheels) Transfers: Sit to/from Stand Sit to Stand: Min guard           General transfer comment: Min verabl cues for RLE and BUE placement    Ambulation/Gait               General Gait Details: deferred secondary to dizziness in standing  Stairs            Wheelchair Mobility    Modified Rankin (Stroke Patients Only)       Balance Overall balance assessment: Needs assistance   Sitting balance-Leahy Scale: Good     Standing balance support: Bilateral upper extremity supported, During functional activity Standing balance-Leahy Scale: Good                               Pertinent Vitals/Pain Pain Assessment Pain Assessment: 0-10 Pain Score: 1  Pain Location: R knee Pain Descriptors / Indicators: Sore Pain Intervention(s): Repositioned, Premedicated before session, Monitored during session, Ice applied    Home Living Family/patient expects to be discharged to:: Private residence Living Arrangements: Spouse/significant other Available Help at Discharge: Family;Available 24 hours/day Type of Home: House Home Access: Stairs to enter Entrance Stairs-Rails: None Entrance Stairs-Number of Steps: 3   Home  Layout: One level Home Equipment: Cane - single point      Prior Function Prior Level of Function : Independent/Modified Independent             Mobility Comments: Ind amb community distances without an AD, no fall history ADLs Comments: Ind with ADLs     Hand Dominance         Extremity/Trunk Assessment   Upper Extremity Assessment Upper Extremity Assessment: Overall WFL for tasks assessed    Lower Extremity Assessment Lower Extremity Assessment: RLE deficits/detail RLE Deficits / Details: BLE ankle strength, AROM, and sensation to light touch grossly intact RLE: Unable to fully assess due to pain       Communication   Communication: No difficulties  Cognition Arousal/Alertness: Awake/alert Behavior During Therapy: WFL for tasks assessed/performed Overall Cognitive Status: Within Functional Limits for tasks assessed                                          General Comments      Exercises Total Joint Exercises Ankle Circles/Pumps: AROM, Strengthening, Both, 10 reps Quad Sets: AROM, Strengthening, Right, 10 reps Long Arc Quad: AROM, Strengthening, Right, 5 reps, 10 reps Knee Flexion: AROM, Strengthening, Right, 5 reps, 10 reps Goniometric ROM: R knee AROM: 3-97 deg Other Exercises Other Exercises: Standing weight shifting right/left x 10   Assessment/Plan    PT Assessment Patient needs continued PT services  PT Problem List Decreased strength;Decreased range of motion;Decreased activity tolerance;Decreased balance;Decreased mobility;Decreased knowledge of use of DME;Pain       PT Treatment Interventions DME instruction;Gait training;Stair training;Functional mobility training;Therapeutic activities;Therapeutic exercise;Balance training;Patient/family education    PT Goals (Current goals can be found in the Care Plan section)  Acute Rehab PT Goals Patient Stated Goal: To walk better without pain PT Goal Formulation: With patient Time For Goal Achievement: 11/20/21 Potential to Achieve Goals: Good    Frequency BID     Co-evaluation               AM-PAC PT "6 Clicks" Mobility  Outcome Measure Help needed turning from your back to your side while in a flat bed without using bedrails?: A Little Help needed moving  from lying on your back to sitting on the side of a flat bed without using bedrails?: A Little Help needed moving to and from a bed to a chair (including a wheelchair)?: A Little Help needed standing up from a chair using your arms (e.g., wheelchair or bedside chair)?: A Little Help needed to walk in hospital room?: A Lot Help needed climbing 3-5 steps with a railing? : A Lot 6 Click Score: 16    End of Session Equipment Utilized During Treatment: Gait belt Activity Tolerance: Other (comment) (limited by dizziness in standing) Patient left: in bed;with call bell/phone within reach;with bed alarm set;with SCD's reapplied;Other (comment) (polar care to R knee) Nurse Communication: Mobility status;Other (comment);Weight bearing status (dizziness in standing) PT Visit Diagnosis: Other abnormalities of gait and mobility (R26.89);Muscle weakness (generalized) (M62.81);Pain Pain - Right/Left: Right Pain - part of body: Knee    Time: 4401-0272 PT Time Calculation (min) (ACUTE ONLY): 37 min   Charges:   PT Evaluation $PT Eval Moderate Complexity: 1 Mod PT Treatments $Therapeutic Activity: 8-22 mins       D. Royetta Asal PT, DPT 11/07/21, 5:28 PM

## 2021-11-07 NOTE — Transfer of Care (Signed)
Immediate Anesthesia Transfer of Care Note  Patient: Maria Keller  Procedure(s) Performed: TOTAL KNEE REVISION (Right: Knee)  Patient Location: PACU  Anesthesia Type:Spinal  Level of Consciousness: drowsy  Airway & Oxygen Therapy: Patient Spontanous Breathing  Post-op Assessment: Report given to RN and Post -op Vital signs reviewed and stable  Post vital signs: Reviewed and stable  Last Vitals:  Vitals Value Taken Time  BP 103/60 11/07/21 1402  Temp    Pulse 92 11/07/21 1405  Resp 10 11/07/21 1405  SpO2 93 % 11/07/21 1405  Vitals shown include unvalidated device data.  Last Pain:  Vitals:   11/07/21 0914  PainSc: 3          Complications: No notable events documented.

## 2021-11-07 NOTE — Op Note (Addendum)
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  Post-Op Diagnosis:   Same  Procedure:   Revision right TKA using all-cemented Zimmer Persona revision knee system with a #5+ femur with a 14 x 135 mm stem (+3 mm offset to 7 o'clock position) and a 5 mm distal medial augment; an "E" tibial tray with a 12 x 135 mm stem (+6 mm offset to 7 o'clock position); an XS tibial cone; and a 12 mm CPS insert.  Surgeon:   Pascal Lux, MD  Assistant:   Cameron Proud, PA-C   Anesthesia:   Spinal  Findings:   As above  Complications:   None  EBL:   10 cc  Fluids:   900 cc crystalloid  UOP:   None  TT:   135 minutes at 300 mmHg  Drains:   None  Closure:   Staples  Implants:   As above  Brief Clinical Note:   The patient is a 71 year old female with gradually worsening right knee pain now 5 years status post a right total knee arthroplasty. Extensive work-up including an infection work-up which was negative, as well as a CT scan and three-phase bone scan was performed, the latter studies suggested aseptic tibial loosening. The patient's symptoms have progressed despite medications, activity modification, etc. Her history and examination are consistent with a septic loosening of the tibial component. The patient presents at this time for a revision right total knee arthroplasty.  Procedure:   The patient was brought into the operating room. After adequate spinal anesthesia was obtained, the patient was lain in the supine position. The right lower extremity was prepped with ChloraPrep solution and draped sterilely. Preoperative antibiotics were administered. After verifying the proper laterality with a surgical timeout, the limb was exsanguinated with an Esmarch and the tourniquet inflated to 300 mmHg.   A standard anterior approach to the knee was made utilizing the previous anterior incision. The incision was  carried down through the subcutaneous tissues to expose superficial retinaculum. This was split the length of the incision and the medial flap elevated sufficiently to expose the medial retinaculum. The medial retinaculum was incised, leaving a 3-4 mm cuff of tissue on the patella. This was extended distally along the medial border of the patellar tendon and proximally through the medial third of the quadriceps tendon. Abundant scar tissues were excised and several samples sent down to the lab for was examination under the microscope for signs of acute inflammation before the soft tissues were elevated off the anteromedial and anterolateral aspects of the proximal tibia to the level of the collateral ligaments. In addition, a culture was obtained. The previously placed femoral and tibial components were removed using osteotomes to minimize any further loss of bone. The exposed bone surfaces were irrigated to remove any residual rind before being irrigated thoroughly with bacitracin saline via the jet lavage system.   Preparation of the proximal tibia was performed first. The 9 mm intramedullary reamer was inserted into the tibia and advanced to the appropriate depth. The tibial canal was reamed sequentially up to a 12 mm reamer which demonstrated good cortical chatter. This was left in place and the intramedullary tibial cutting guide positioned.  Utilizing the +0 slot, a freshening cut was performed. This resulted in an excellent circumferential cortical rim. The tibial trial components were positioned. It was determined that the "E" tibial tray with 6 mm  offset (set to the 7 o'clock position) provided the best coverage. The tray was pinned into position and the central reamer advanced to the appropriate depth. The extra extra small cone punch was inserted followed by the extra small cone punch. The extra small cone punch provided excellent circumferential bony contact in the metaphyseal region. Finally, the keel  was created using the appropriate punch to complete preparation of the tibia. The trial tibial component was put together on the back table and inserted. It was found to fit quite well.  Attention was directed to the distal femur. The intramedullary canal was accessed with a 9 mm reamer inserted to a depth of 135 mm. The canal was reamed sequentially by 1 mm increments to 14 mm. This provided good cortical chatter at a depth of 135 mm. The reamer was left in place and the distal cutting block positioned. A 1 mm freshening cut was performed.  This provided excellent freshening of the lateral condylar surface, but insufficient freshening of the medial surface.  Therefore, an extra 5 mm was resected from the distal portion of the medial condyle. Utilizing the adjustable offset device, the femur was offset anteriorly by 3 mm with the dial set at the 7 o'clock position, resulting in excellent medial-lateral coverage of the distal femur.  The 4-in-1 cutting block was then positioned and the appropriate anterior, posterior, and chamfer cuts made. A trial reduction was performed using both the femoral and tibial components and first the 10 mm and then the 12 mm CPS inserts. The 12 mm insert demonstrated excellent stability to varus and valgus stressing in both flexion and extension while permitting full extension.   The patella itself was assessed and found to be well fixed and in good position. Therefore, this was not revised. Patellar tracking also was assessed and found to track quite well. The trial components were then removed.  The bony surfaces were prepared for cementing by irrigating them thoroughly with bacitracin saline solution. In addition, 20 cc of Exparel, 2 cc of Kenalog 40 (80 mg), 30 mg of Toradol, and 30 cc of 0.5% Sensorcaine diluted out to 90 cc with normal saline were injected into the postero-medial and postero-lateral aspects of the knee, the medial and lateral gutter regions, and the  peri-incisional tissues to help with postoperative analgesia. Meanwhile, the permanent femoral and tibial components were put together on the back table utilizing all of the appropriate pieces. Once the components were put together properly, the cement was mixed on the back table.  At this point, the XS tibial metaphyseal cone was impacted into place.  When the cement was ready, the tibial tray was cemented in first. The excess cement was removed using [employer] fast streamer. Next, the femoral component was impacted into place. Again, the excess cement was removed using [employer] fast streamer. The 12 mm CPS trial insert was positioned and the knee brought into extension while the cement hardened. Finally, the patella was cemented into place and secured using the patellar clamp. Again, the excess cement was removed using [employer] fast streamer. Once the cement had hardened, the knee was placed through a range of motion with the findings as described above. Therefore, the trial insert was removed and, after verifying that no cement had been retained posteriorly, the permanent insert was positioned and its locking mechanism verified using 1/4 inch osteotome. Again the knee was placed through a range of motion with the findings as described above.  The wound was copiously irrigated with sterile saline solution using  the jet lavage system before the quadriceps tendon and retinacular layer were reapproximated using #0 Vicryl interrupted sutures. A total of 10 cc of transexemic acid (TXA) was injected intra-articularly before the subcutaneous tissues were closed in several layers using 2-0 Vicryl interrupted sutures. The skin was closed using staples. A sterile honeycomb dressing was applied to the skin before the leg was wrapped with an Ace wrap to accommodate the Polar care device The patient was then awakened and returned to the recovery room in satisfactory condition after tolerating the procedure well.

## 2021-11-07 NOTE — Anesthesia Preprocedure Evaluation (Signed)
Anesthesia Evaluation  Patient identified by MRN, date of birth, ID band Patient awake    Reviewed: Allergy & Precautions, NPO status , Patient's Chart, lab work & pertinent test results  History of Anesthesia Complications (+) PONV and history of anesthetic complications  Airway Mallampati: II  TM Distance: >3 FB Neck ROM: full    Dental  (+) Dental Advidsory Given, Poor Dentition   Pulmonary neg pulmonary ROS, neg shortness of breath, neg COPD,    Pulmonary exam normal        Cardiovascular (-) anginanegative cardio ROS Normal cardiovascular exam     Neuro/Psych PSYCHIATRIC DISORDERS negative neurological ROS     GI/Hepatic Neg liver ROS, GERD  ,  Endo/Other  negative endocrine ROS  Renal/GU      Musculoskeletal   Abdominal   Peds  Hematology negative hematology ROS (+)   Anesthesia Other Findings Past Medical History: No date: Anemia     Comment:  iron deficiency and B12 deficiency No date: Arthritis 08/2015: Bronchitis No date: Chronic back pain     Comment:  followed by Dr Primus Bravo No date: Depression No date: Diverticulosis No date: Esophageal stricture     Comment:  requiring dilatation x 2 No date: Fibromyalgia No date: Fibromyalgia No date: Fibromyalgia No 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[place])     Comment:  Followed by Dr Leretha Pol, s/p chemo and XRT No 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[facility]) No date: GERD (gastroesophageal reflux disease) No date: GERD (gastroesophageal reflux disease) No date: Hemorrhoid No date: Hiatal hernia No date: History of kidney stones No date: Hypercholesterolemia No date: IBS (irritable bowel syndrome) No date: IBS (irritable bowel syndrome) No date: Nephrolithiasis     Comment:  followed by Dr Bernardo Heater, s/p stents No date: PONV (postoperative nausea and vomiting)     Comment:  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 11/26/2011: Shingles outbreak No date: Sleep apnea  Past Surgical History: No date: APPENDECTOMY 1993: BLADDER SURGERY No date: BREAST BIOPSY; N/A No date: capsule endoscopy 1995: CHOLECYSTECTOMY 06/19/2017: COLONOSCOPY WITH PROPOFOL; N/A     Comment:  Procedure: COLONOSCOPY WITH PROPOFOL;  Surgeon: Manya Silvas, MD;  Location: [facility] ENDOSCOPY;  Service:               Endoscopy;  Laterality: N/A; 06/19/2017: ESOPHAGOGASTRODUODENOSCOPY (EGD) WITH PROPOFOL; N/A     Comment:  Procedure: ESOPHAGOGASTRODUODENOSCOPY (EGD) WITH               PROPOFOL;  Surgeon: Manya Silvas, MD;  Location:               [facility] ENDOSCOPY;  Service: Endoscopy;  Laterality: N/A; No date: EYE SURGERY     Comment:  both cataract remove No date: FRACTURE SURGERY; Left     Comment:  ankle, plate with 8 screws No date: history of helicobacter pylori infection No date: JOINT REPLACEMENT; Right     Comment:  knee No date: LEFT OOPHORECTOMY No date: LEG SURGERY; Left 2012: ORIF ANKLE FRACTURE No date: OVARIAN CYST REMOVAL     Comment:  right No date: RECTOCELE REPAIR; N/A 03/19/2016: REPLACEMENT TOTAL KNEE; Right 1970: TONSILECTOMY/ADENOIDECTOMY WITH MYRINGOTOMY No date: TONSILLECTOMY; N/A 1976: TUBAL LIGATION 1980's: VAGINAL HYSTERECTOMY     Comment:  secondary to bleeding  BMI    Body Mass Index:  26.63 kg/m      Reproductive/Obstetrics negative OB ROS                             Anesthesia Physical Anesthesia Plan  ASA: 3  Anesthesia Plan: General/Spinal   Post-op Pain Management:    Induction:   PONV Risk Score and Plan: 3  Airway Management Planned: Natural Airway and Nasal Cannula  Additional Equipment:   Intra-op Plan:   Post-operative Plan: Extubation in OR  Informed Consent: I have reviewed the patients History and Physical, chart, labs and discussed the procedure including the risks, benefits and alternatives for the proposed  anesthesia with the patient or authorized representative who has indicated his/her understanding and acceptance.     Dental Advisory Given  Plan Discussed with: Anesthesiologist, CRNA and Surgeon  Anesthesia Plan Comments: (Patient consented for risks of anesthesia including but not limited to:  - adverse reactions to medications - damage to eyes, teeth, lips or other oral mucosa - nerve damage due to positioning  - sore throat or hoarseness - Damage to heart, brain, nerves, lungs, other parts of body or loss of life  Patient voiced understanding.)        Anesthesia Quick Evaluation

## 2021-11-08 ENCOUNTER — Encounter: Payer: Self-pay | Admitting: Surgery

## 2021-11-08 ENCOUNTER — Encounter: Payer: Medicare Other | Admitting: Internal Medicine

## 2021-11-08 LAB — CBC
HCT: 32.8 % — ABNORMAL LOW (ref 36.0–46.0)
Hemoglobin: 10.6 g/dL — ABNORMAL LOW (ref 12.0–15.0)
MCH: 27.4 pg (ref 26.0–34.0)
MCHC: 32.3 g/dL (ref 30.0–36.0)
MCV: 84.8 fL (ref 80.0–100.0)
Platelets: 187 10*3/uL (ref 150–400)
RBC: 3.87 MIL/uL (ref 3.87–5.11)
RDW: 13.5 % (ref 11.5–15.5)
WBC: 12.4 10*3/uL — ABNORMAL HIGH (ref 4.0–10.5)
nRBC: 0 % (ref 0.0–0.2)

## 2021-11-08 LAB — BASIC METABOLIC PANEL
Anion gap: 6 (ref 5–15)
BUN: 11 mg/dL (ref 8–23)
CO2: 29 mmol/L (ref 22–32)
Calcium: 8.6 mg/dL — ABNORMAL LOW (ref 8.9–10.3)
Chloride: 103 mmol/L (ref 98–111)
Creatinine, Ser: 0.68 mg/dL (ref 0.44–1.00)
GFR, Estimated: >60 mL/min (ref >60)
Glucose, Bld: 163 mg/dL — ABNORMAL HIGH (ref 70–99)
Potassium: 3.3 mmol/L — ABNORMAL LOW (ref 3.5–5.1)
Sodium: 138 mmol/L (ref 135–145)

## 2021-11-08 MED ORDER — APIXABAN 2.5 MG PO TABS: 2.5000 mg | ORAL_TABLET | Freq: Two times a day (BID) | ORAL | 0 refills | Status: DC

## 2021-11-08 MED ORDER — OXYCODONE HCL 5 MG PO TABS: 5.0000 mg | ORAL_TABLET | ORAL | 0 refills | Status: DC | PRN

## 2021-11-08 MED ORDER — ONDANSETRON HCL 4 MG PO TABS: 4.0000 mg | ORAL_TABLET | Freq: Four times a day (QID) | ORAL | 0 refills | Status: DC | PRN

## 2021-11-08 NOTE — Evaluation (Addendum)
Occupational Therapy Evaluation Patient Details Name: Maria Keller MRN: [id number] DOB: 1950-05-15 Today's Date: 11/08/2021   History of Present Illness Pt is a 71 yo F diagnosed with aseptic loosening status post right total knee arthroplasty and is now s/p right TKA revision. PMH includes: chronic back pain, fibromyalgia, depression, follicular lymphoma, OA, and sleep apnea.   Clinical Impression   Patient seen for OT evaluation. Patient presenting with R knee pain, decreased endurance, and decreased strength impacting safety and independence in ADLs. At baseline, pt is independent with ADLs, IADLs, and functional mobility with no AD. Patient currently functioning at [facility] guard for functional mobility to the bathroom using RW, supervision for toilet transfer using BSC frame over toilet, and set up-supervision for ADLs. Pt was educated on polar care system, precautions for RLE WBAT, and no pillow under R knee. Education and demonstration was provided for LB dressing AE including reacher and sock aid. Pt able to complete LB dressing without AE. Pt lives with husband who is able to provide physical assistance as needed upon discharge. Patient will benefit from acute OT to increase overall independence in the areas of ADLs and functional mobility in order to safely discharge home.      Recommendations for follow up therapy are one component of a multi-disciplinary discharge planning process, led by the attending physician.  Recommendations may be updated based on patient status, additional functional criteria and insurance authorization.   Follow Up Recommendations  No OT follow up    Assistance Recommended at Discharge PRN  Patient can return home with the following A little help with walking and/or transfers;A little help with bathing/dressing/bathroom;Assistance with cooking/housework;Assist for transportation;Help with stairs or ramp for entrance    Functional Status Assessment  Patient has  had a recent decline in their functional status and demonstrates the ability to make significant improvements in function in a reasonable and predictable amount of time.  Equipment Recommendations  BSC/3in1;Tub/shower seat    Recommendations for Other Services       Precautions / Restrictions Precautions Precautions: Fall Restrictions Weight Bearing Restrictions: Yes RLE Weight Bearing: Weight bearing as tolerated      Mobility Bed Mobility               General bed mobility comments: NT, pt received and left in recliner    Transfers Overall transfer level: Needs assistance Equipment used: Rolling walker (2 wheels) Transfers: Sit to/from Stand Sit to Stand: Supervision           General transfer comment: for STS from recliner and toilet      Balance Overall balance assessment: Needs assistance Sitting-balance support: Feet supported Sitting balance-Leahy Scale: Good     Standing balance support: Bilateral upper extremity supported, No upper extremity supported, During functional activity   Standing balance comment: BUE support from RW during functional mobility, no UE support required during hand hygiene/toileting tasks                           ADL either performed or assessed with clinical judgement   ADL Overall ADL's : Needs assistance/impaired     Grooming: Wash/dry hands;Standing;Supervision/safety               Lower Body Dressing: Supervision/safety;Set up;Sitting/lateral leans;Sit to/from stand Lower Body Dressing Details (indicate cue type and reason): Demonstrated provided for using reacher to don/doff mesh underwear. Pt then able to don/doff mesh underwear and socks with set up and no  AE. Toilet Transfer: Supervision/safety;BSC/3in1;Regular Toilet;Rolling walker (2 wheels) Toilet Transfer Details (indicate cue type and reason): Pt completed toilet transfer with supervision, use of BSC frame over regular toilet Toileting-  Clothing Manipulation and Hygiene: Supervision/safety;Sit to/from stand Toileting - Clothing Manipulation Details (indicate cue type and reason): for peri-care and clothing management in standing     Functional mobility during ADLs: Min guard;Rolling walker (2 wheels) (at room level to the bathroom) General ADL Comments: Pt reported completing grooming routine while standing at the sink this morning.     Vision Baseline Vision/History: 1 Wears glasses Patient Visual Report: No change from baseline       Perception     Praxis      Pertinent Vitals/Pain Pain Assessment Pain Assessment: 0-10 Pain Score: 4  Pain Location: R knee Pain Descriptors / Indicators: Sore Pain Intervention(s): Monitored during session, Ice applied     Hand Dominance     Extremity/Trunk Assessment Upper Extremity Assessment Upper Extremity Assessment: Overall WFL for tasks assessed   Lower Extremity Assessment Lower Extremity Assessment: RLE deficits/detail RLE Deficits / Details: s/p TKA RLE: Unable to fully assess due to pain       Communication Communication Communication: No difficulties   Cognition Arousal/Alertness: Awake/alert Behavior During Therapy: WFL for tasks assessed/performed Overall Cognitive Status: Within Functional Limits for tasks assessed                                 General Comments: A&Ox4, motivated     General Comments       Exercises Other Exercises Other Exercises: OT provided education re: role of OT, OT POC, post acute recs, sitting up for all meals, EOB/OOB mobility with assistance, home/fall safety, RLE WBAT, no pillow under R knee, LB dressing AE, handout provided.    Shoulder Instructions      Home Living Family/patient expects to be discharged to:: Private residence Living Arrangements: Spouse/significant other Available Help at Discharge: Family;Available 24 hours/day Type of Home: House Home Access: Stairs to enter [employer] of Steps: 3 Entrance Stairs-Rails: None Home Layout: One level     Bathroom Shower/Tub: Tub/shower unit;Walk-in shower (will be using walk in shower at D/C)         Home Equipment: Kasandra Knudsen - single point   Additional Comments: Husband works part time, but has taken off whole month of October to be home with pt.      Prior Functioning/Environment Prior Level of Function : Independent/Modified Independent;Driving             Mobility Comments: Ind amb community distances without an AD, no fall history ADLs Comments: Ind with ADLs        OT Problem List: Decreased strength;Decreased range of motion;Decreased activity tolerance;Impaired balance (sitting and/or standing);Pain      OT Treatment/Interventions: Self-care/ADL training;Patient/family education;Therapeutic exercise;Balance training;Energy conservation;DME and/or AE instruction;Therapeutic activities    OT Goals(Current goals can be found in the care plan section) Acute Rehab OT Goals Patient Stated Goal: go home OT Goal Formulation: With patient Time For Goal Achievement: 11/22/21 Potential to Achieve Goals: Good ADL Goals Pt Will Perform Grooming: Independently;standing Pt Will Perform Lower Body Dressing: Independently;sit to/from stand;sitting/lateral leans Pt Will Transfer to Toilet: Independently;ambulating;bedside commode Pt Will Perform Toileting - Clothing Manipulation and hygiene: Independently;sit to/from stand  OT Frequency: Min 2X/week    Co-evaluation              AM-PAC  OT "6 Clicks" Daily Activity     Outcome Measure Help from another person eating meals?: None Help from another person taking care of personal grooming?: None Help from another person toileting, which includes using toliet, bedpan, or urinal?: A Little Help from another person bathing (including washing, rinsing, drying)?: A Little Help from another person to put on and taking off regular upper body clothing?:  None Help from another person to put on and taking off regular lower body clothing?: A Little 6 Click Score: 21   End of Session Equipment Utilized During Treatment: Gait belt;Rolling walker (2 wheels) Nurse Communication: Mobility status  Activity Tolerance: Patient tolerated treatment well Patient left: in chair;with call bell/phone within reach;with chair alarm set  OT Visit Diagnosis: Other abnormalities of gait and mobility (R26.89);Muscle weakness (generalized) (M62.81);Pain Pain - Right/Left: Right Pain - part of body: Knee                Time: 3568-6168 OT Time Calculation (min): 20 min Charges:  OT General Charges $OT Visit: 1 Visit OT Evaluation $OT Eval Low Complexity: 1 Low  Gi Or Norman MS, OTR/L ascom [phone]  11/08/21, 9:15 AM

## 2021-11-08 NOTE — Progress Notes (Addendum)
1020 166mg and 256m old fentanyl patches removed from pt abdomen. Justyce Ashworth LPN at bedside to witness, and waste. Nurse also witnessed new 10035mand 22m22mentanyl patches replaced to abdomen.   1533 D/c paperwork reviewed with pt and husband. Voided post foley removal. IV removed. ACE bandage removed honeycomb with scant amount of breakthrough drainage TED hose applied to R leg.

## 2021-11-08 NOTE — Progress Notes (Signed)
Met with the patient and her family in the room she lives at home with her husband He will provide transportation She is set up with [place] for [facility] She needs a RW and a 3 in 1 [employer] to deliver to the room prior to [place] reviewed

## 2021-11-08 NOTE — Progress Notes (Signed)
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 PMH includes: chronic back pain, fibromyalgia, depression, follicular lymphoma, OA, and sleep apnea.    PT Comments    Pt was pleasant and motivated to participate during the session and put forth good effort throughout. Pt required no physical assistance during the session and was steady with good control and stability with transfers, gait, and stair training.  Pt required cuing for sequencing at times, most notably during stair training, with spouse present during the session and participating with training.  Pt requested additional stair training in the afternoon prior to discharge.  No adverse symptoms during the session other than R knee pain with SpO2 and HR WNL on room air.  Pt will benefit from HHPT upon discharge to safely address deficits listed in patient problem list for decreased caregiver assistance and eventual return to PLOF.     Recommendations for follow up therapy are one component of a multi-disciplinary discharge planning process, led by the attending physician.  Recommendations may be updated based on patient status, additional functional criteria and insurance authorization.  Follow Up Recommendations  Home health PT     Assistance Recommended at Discharge Intermittent Supervision/Assistance  Patient can return home with the following A little help with walking and/or transfers;A little help with bathing/dressing/bathroom;Assistance with cooking/housework;Help with stairs or ramp for entrance;Assist for transportation   Equipment Recommendations  Rolling walker (2 wheels);BSC/3in1    Recommendations for Other Services       Precautions / Restrictions Precautions Precautions:  Fall Restrictions Weight Bearing Restrictions: Yes RLE Weight Bearing: Weight bearing as tolerated     Mobility  Bed Mobility               General bed mobility comments: NT, pt received and left in recliner    Transfers Overall transfer level: Needs assistance Equipment used: Rolling walker (2 wheels) Transfers: Sit to/from Stand Sit to Stand: Supervision           General transfer comment: Min verbal cues for sequencing    Ambulation/Gait Ambulation/Gait assistance: Min guard Gait Distance (Feet): 125 Feet Assistive device: Rolling walker (2 wheels) Gait Pattern/deviations: Step-to pattern, Step-through pattern, Decreased stance time - right Gait velocity: decreased     General Gait Details: Pt alternated between step-to and step-through pattern with short steps length and slow cadence but steady without LOB   Stairs Stairs: Yes Stairs assistance: Min guard Stair Management: With walker, Backwards, Forwards, Step to pattern Number of Stairs: 4 General stair comments: Mod multi-modal cues for sequencing with spouse present for and participating with training; ascend backwards and descend forwards with a RW   Wheelchair Mobility    Modified Rankin (Stroke Patients Only)       Balance Overall balance assessment: Needs assistance Sitting-balance support: Feet unsupported Sitting balance-Leahy Scale: Normal     Standing balance support: Bilateral upper extremity supported, No upper extremity supported, During functional activity Standing balance-Leahy Scale: Good                              Cognition Arousal/Alertness: Awake/alert Behavior During Therapy: WFL for tasks assessed/performed Overall Cognitive Status: Within Functional Limits for tasks assessed  Exercises Total Joint Exercises Quad Sets: AROM, Strengthening, Right, 10 reps, 5 reps Long Arc Quad: AROM,  Strengthening, Right, 5 reps, 10 reps Knee Flexion: AROM, Strengthening, Right, 5 reps, 10 reps Goniometric ROM: R knee AROM: 7-115 deg Other Exercises Other Exercises: Car transfer sequencing education provided to pt and spouse Other Exercises: HEP review per handout Other Exercises: Positioning education provided to pt and spouse to promote R knee ext PROM    General Comments        Pertinent Vitals/Pain Pain Assessment Pain Assessment: 0-10 Pain Score: 4  Pain Location: R knee Pain Descriptors / Indicators: Sore Pain Intervention(s): Repositioned, Monitored during session, RN gave pain meds during session    Home Living Family/patient expects to be discharged to:: Private residence Living Arrangements: Spouse/significant other Available Help at Discharge: Family;Available 24 hours/day Type of Home: House Home Access: Stairs to enter Entrance Stairs-Rails: None Entrance Stairs-Number of Steps: 3   Home Layout: One level Home Equipment: Kasandra Knudsen - single point Additional Comments: Husband works part time, but has taken off whole month of October to be home with pt.    Prior Function            PT Goals (current goals can now be found in the care plan section) Progress towards PT goals: Progressing toward goals    Frequency    BID      PT Plan Current plan remains appropriate    Co-evaluation              AM-PAC PT "6 Clicks" Mobility   Outcome Measure  Help needed turning from your back to your side while in a flat bed without using bedrails?: A Little Help needed moving from lying on your back to sitting on the side of a flat bed without using bedrails?: A Little Help needed moving to and from a bed to a chair (including a wheelchair)?: A Little Help needed standing up from a chair using your arms (e.g., wheelchair or bedside chair)?: A Little Help needed to walk in hospital room?: A Little Help needed climbing 3-5 steps with a railing? : A Little 6  Click Score: 18    End of Session Equipment Utilized During Treatment: Gait belt Activity Tolerance: Patient tolerated treatment well Patient left: in chair;with call bell/phone within reach;with SCD's reapplied;Other (comment);with chair alarm set;with family/visitor present (polar care donned to R knee) Nurse Communication: Mobility status PT Visit Diagnosis: Other abnormalities of gait and mobility (R26.89);Muscle weakness (generalized) (M62.81);Pain Pain - Right/Left: Right Pain - part of body: Knee     Time: 7902-4097 PT Time Calculation (min) (ACUTE ONLY): 38 min  Charges:  $Gait Training: 8-22 mins $Therapeutic Exercise: 8-22 mins $Therapeutic Activity: 8-22 mins                     D. Scott Rayan Dyal PT, DPT 11/08/21, 12:31 PM

## 2021-11-08 NOTE — Progress Notes (Cosign Needed)
Patient is not able to walk the distance required to go the bathroom, or he/she is unable to safely negotiate stairs required to access the bathroom.  A 3in1 BSC will alleviate this problem  

## 2021-11-08 NOTE — Progress Notes (Signed)
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 PMH includes: chronic back pain, fibromyalgia, depression, follicular lymphoma, OA, and sleep apnea.    PT Comments    Pt was pleasant and motivated to participate during the session and put forth good effort throughout.  Pt steady with gait alternating between step-to and step-through pattern with improved cadence.  Pt steady with good carryover of proper sequencing during stair training with significant improvement in cadence and confidence, spouse again present for training.  Pt spouse both agree pt ready for discharge with no concerns or further questions from a physical therapy perspective.  Pt will benefit from HHPT upon discharge to safely address deficits listed in patient problem list for decreased caregiver assistance and eventual return to PLOF.     Recommendations for follow up therapy are one component of a multi-disciplinary discharge planning process, led by the attending physician.  Recommendations may be updated based on patient status, additional functional criteria and insurance authorization.  Follow Up Recommendations  Home health PT     Assistance Recommended at Discharge Intermittent Supervision/Assistance  Patient can return home with the following A little help with walking and/or transfers;A little help with bathing/dressing/bathroom;Assistance with cooking/housework;Help with stairs or ramp for entrance;Assist for transportation   Equipment Recommendations  Rolling walker (2 wheels);BSC/3in1    Recommendations for Other Services       Precautions / Restrictions Precautions Precautions: Fall Restrictions Weight Bearing Restrictions: Yes RLE Weight Bearing: Weight bearing as tolerated      Mobility  Bed Mobility               General bed mobility comments: NT, pt received and left in recliner    Transfers Overall transfer level: Needs assistance Equipment used: Rolling walker (2 wheels) Transfers: Sit to/from Stand Sit to Stand: Supervision           General transfer comment: Good control and stability as well as carryover of proper sequencing    Ambulation/Gait Ambulation/Gait assistance: Min guard Gait Distance (Feet): 125 Feet x 2 Assistive device: Rolling walker (2 wheels) Gait Pattern/deviations: Step-to pattern, Step-through pattern, Decreased stance time - right Gait velocity: decreased     General Gait Details: Pt alternated between step-to and step-through pattern with short steps length and slow cadence but steady without LOB   Stairs Stairs: Yes Stairs assistance: Min guard Stair Management: With walker, Backwards, Forwards, Step to pattern Number of Stairs: 4 General stair comments: Min multi-modal cues for sequencing with spouse present for and participating with training; ascend backwards and descend forwards with a RW with good carryover of proper sequencing   Wheelchair Mobility    Modified Rankin (Stroke Patients Only)       Balance Overall balance assessment: Needs assistance Sitting-balance support: Feet unsupported Sitting balance-Leahy Scale: Normal     Standing balance support: Bilateral upper extremity supported, During functional activity Standing balance-Leahy Scale: Good                              Cognition Arousal/Alertness: Awake/alert Behavior During Therapy: WFL for tasks assessed/performed Overall Cognitive Status: Within Functional Limits for tasks assessed  Exercises Total Joint Exercises Quad Sets: AROM, Strengthening, Right, 10 reps, 5 reps Long Arc Quad: AROM, Strengthening, Right, 5 reps, 10 reps Knee Flexion: AROM,  Strengthening, Right, 5 reps, 10 reps Goniometric ROM: R knee AROM: 7-115 deg Other Exercises Other Exercises: Car transfer sequencing education provided to pt and spouse Other Exercises: HEP review per handout Other Exercises: Positioning education provided to pt and spouse to promote R knee ext PROM    General Comments        Pertinent Vitals/Pain Pain Assessment Pain Assessment: 0-10 Pain Score: 4  Pain Location: R knee Pain Descriptors / Indicators: Sore Pain Intervention(s): Repositioned, Premedicated before session, Monitored during session, Ice applied    Home Living                          Prior Function            PT Goals (current goals can now be found in the care plan section) Progress towards PT goals: Progressing toward goals    Frequency    BID      PT Plan Current plan remains appropriate    Co-evaluation              AM-PAC PT "6 Clicks" Mobility   Outcome Measure  Help needed turning from your back to your side while in a flat bed without using bedrails?: A Little Help needed moving from lying on your back to sitting on the side of a flat bed without using bedrails?: A Little Help needed moving to and from a bed to a chair (including a wheelchair)?: A Little Help needed standing up from a chair using your arms (e.g., wheelchair or bedside chair)?: A Little Help needed to walk in hospital room?: A Little Help needed climbing 3-5 steps with a railing? : A Little 6 Click Score: 18    End of Session Equipment Utilized During Treatment: Gait belt Activity Tolerance: Patient tolerated treatment well Patient left: in chair;with call bell/phone within reach;with SCD's reapplied;Other (comment);with chair alarm set;with family/visitor present (polar care to R knee) Nurse Communication: Mobility status PT Visit Diagnosis: Other abnormalities of gait and mobility (R26.89);Muscle weakness (generalized) (M62.81);Pain Pain - Right/Left:  Right Pain - part of body: Knee     Time: 1610-9604 PT Time Calculation (min) (ACUTE ONLY): 17 min  Charges:  $Gait Training: 8-22 mins $Therapeutic Exercise: 8-22 mins $Therapeutic Activity: 8-22 mins                     D. Scott Hiyab Nhem PT, DPT 11/08/21, 3:59 PM

## 2021-11-08 NOTE — Discharge Summary (Signed)
Physician Discharge Summary  Patient ID: Maria Keller MRN: [id number] DOB/AGE: [birth year] 71 y.o.  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  Discharge Diagnoses: Patient Active Problem List   Diagnosis Date Noted   Status post revision of total knee replacement, right 11/07/2021   Lymphedema 10/15/2021   Chronic venous insufficiency 10/15/2021   Chronic thigh pain (2ry area of Pain) (Bilateral) (R>L) 10/12/2021   Chronic knee pain after total replacement (3ry area of Pain) (Right) 10/12/2021   Chronic shoulder pain (4th area of Pain) (Bilateral) (R>L) 10/12/2021   Diverticulosis 10/11/2021   Chronic pain syndrome 10/11/2021   Pharmacologic therapy 10/11/2021   Disorder of skeletal system 10/11/2021   Problems influencing health status 10/11/2021   Chronic use of opiate for therapeutic purpose 10/11/2021   Opioid use (487.5 MME/day) 10/11/2021   Drug tolerance, sequela 10/11/2021   Physical tolerance to opiate drug 10/11/2021   Dysuria 07/22/2021   Chipped tooth 07/22/2021   Estrogen deficiency 07/08/2021   Breast cancer screening 07/06/2021   Loose total knee arthroplasty, sequela (Right) 06/12/2021   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, right 01/04/2021   Knee swelling 12/21/2020   External nasal lesion 11/13/2020   Tingling sensation 07/11/2020   Ear lesion 04/10/2020   B12 deficiency 44/81/8563   Folliculitis 14/97/0263   Hyperglycemia 03/08/2019   Skin lesion 11/02/2018   Fatigue 11/26/2017   History of shingles 08/16/2017   Intractable migraine with aura without status migrainosus 08/16/2017   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[place]) 07/15/2017   Headache 05/03/2017   History of total knee replacement (Right) 09/10/2016   Osteoarthritis 07/11/2016   Psoriasis (a type of skin inflammation)  07/11/2016   Hair loss 07/06/2016   Primary osteoarthritis of right knee 10/24/2015   Acute upper respiratory infection 07/29/2015   Cough 07/02/2015   Overweight (BMI 25.0-29.9) 07/02/2015   Acute bronchitis 03/24/2015   Night sweats 10/27/2014   Degenerative disc disease, lumbar 10/18/2014   Lumbar radiculopathy 10/18/2014   DDD (degenerative disc disease), lumbar 07/13/2014   Facet syndrome, lumbar 07/13/2014   Sacroiliac joint dysfunction 07/13/2014   Postherpetic neuralgia 07/13/2014   Health care maintenance 07/04/2014   Hypercholesterolemia 06/23/2014   Chronic low back pain (1ry area of Pain) (Bilateral) (R>L) w/o sciatica 06/07/2014   Chronic sacroiliac joint pain 06/07/2014   Non-Hodgkin's lymphoma (HCC)    Pain in joint involving multiple sites 03/12/2014   SOB (shortness of breath) 11/15/2013   Rectocele 10/27/2013   Sleep apnea 10/27/2013   Enterocele 10/25/2013   Skin nodule 02/22/2013   Rash 06/12/2012   Pure hypercholesterolemia 02/15/2012   Nephrolithiasis 02/15/2012   Gastro-esophageal reflux disease without esophagitis 02/15/2012   Fibromyalgia 02/15/2012   Myalgia and myositis 02/15/2012   History of lymphoma 11/13/2011   Anemia 11/13/2011   Lymphoma ([place]) 11/13/2011   History of urinary stone 10/30/2011    Past Medical History:  Diagnosis Date   Anemia    iron deficiency and B12 deficiency   Arthritis    Bronchitis 08/2015   Chronic back pain    followed by Dr Primus Bravo   Depression    Diverticulosis    Esophageal stricture    requiring dilatation x 2   Fibromyalgia    Fibromyalgia    Fibromyalgia    Follicular lymphoma ([place])    Followed by Dr Leretha Pol, s/p chemo and XRT  Follicular lymphoma (HCC)    GERD (gastroesophageal reflux disease)    GERD (gastroesophageal reflux disease)    Hemorrhoid    Hiatal hernia    History of kidney stones    Hypercholesterolemia    IBS (irritable bowel syndrome)    IBS (irritable bowel syndrome)     Nephrolithiasis    followed by Dr Bernardo Heater, s/p stents   PONV (postoperative nausea and vomiting)    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   Shingles outbreak 11/26/2011   Sleep apnea      Transfusion: None.   Consultants (if any):   Discharged Condition: Improved  Hospital Course: Maria Keller is an 71 y.o. female who was admitted 11/07/2021 with a diagnosis of aseptic loosening s/p right total knee arthroplasty and went to the operating room on 11/07/2021 and underwent the above named procedures.    Surgeries: Procedure(s): TOTAL KNEE REVISION on 11/07/2021 Patient tolerated the surgery well. Taken to PACU where she was stabilized and then transferred to the orthopedic floor.  Started on Eliquis 2.46m tab every 12 hours. Heels elevated on bed with rolled towels. No evidence of DVT. Negative Homan. Physical therapy started on day #1 for gait training and transfer. OT started day #1 for ADL and assisted devices.  Patient's IV , foley and hemovac was d/c on day #2.  Implants: Revision right TKA using all-cemented Zimmer Persona revision knee system with a #5+ femur with a 14 x 135 mm stem (+3 mm offset to 7 o'clock position) and a 5 mm distal medial augment; an "E" tibial tray with a 12 x 135 mm stem (+6 mm offset to 7 o'clock position); an XS tibial cone; and a 12 mm CPS insert.   She was given perioperative antibiotics:  Anti-infectives (From admission, onward)    Start     Dose/Rate Route Frequency Ordered Stop   11/07/21 2000  vancomycin (VANCOCIN) IVPB 1000 mg/200 mL premix        1,000 mg 200 mL/hr over 60 Minutes Intravenous Every 12 hours 11/07/21 1518 11/07/21 2330   11/07/21 0600  vancomycin (VANCOCIN) IVPB 1000 mg/200 mL premix        1,000 mg 200 mL/hr over 60 Minutes Intravenous On call to O.R. 11/07/21 0022 11/07/21 1037     .  She was given sequential compression devices, early ambulation, and Eliquis for DVT  prophylaxis.  She benefited maximally from the hospital stay and there were no complications.    Recent vital signs:  Vitals:   11/08/21 0525 11/08/21 0816  BP: (!) 110/52 115/61  Pulse: 65 78  Resp: 20   Temp: 97.8 F (36.6 C) 98.5 F (36.9 C)  SpO2: 97% 95%    Recent laboratory studies:  Lab Results  Component Value Date   HGB 10.6 (L) 11/08/2021   HGB 12.9 10/16/2021   HGB 12.3 02/28/2021   Lab Results  Component Value Date   WBC 12.4 (H) 11/08/2021   PLT 187 11/08/2021   No results found for: "INR" Lab Results  Component Value Date   NA 138 11/08/2021   K 3.3 (L) 11/08/2021   CL 103 11/08/2021   CO2 29 11/08/2021   BUN 11 11/08/2021   CREATININE 0.68 11/08/2021   GLUCOSE 163 (H) 11/08/2021    Discharge Medications:   Allergies as of 11/08/2021       Reactions   Oxycodone Hcl Itching   Other reaction(s): Headache (finding)  Oxycontin [oxycodone Hcl] Itching   Sleep Apnea   Penicillins Anaphylaxis, Itching   Chloral Hydrate Hives   Other Reaction: Not Assessed   Ciprofloxacin Itching   Ciprofloxacin Hcl Itching   She can take Levaquin   Colace [docusate Calcium]    Methotrexate Derivatives Other (See Comments)   Questionable   Parafon Forte Dsc [chlorzoxazone] Itching, Other (See Comments)   Other Reaction: Not Assessed   Penicillin G Hives   Tape Other (See Comments)   adhesive  tape cause redness and skin to peel   Bextra [valdecoxib] Itching   Other reaction(s): Other (See Comments) Other Reaction: Not Assessed   Oxycodone Itching   Rofecoxib Itching   Other reaction(s): Headache (finding)        Medication List     STOP taking these medications    aspirin EC 81 MG tablet       TAKE these medications    acetaminophen 650 MG CR tablet Commonly known as: TYLENOL Take 650 mg by mouth every 8 (eight) hours as needed for pain.   apixaban 2.5 MG Tabs tablet Commonly known as: ELIQUIS Take 1 tablet (2.5 mg total) by mouth 2 (two)  times daily.   atorvastatin 10 MG tablet Commonly known as: LIPITOR Take 1 tablet (10 mg total) by mouth daily.   azelastine 0.1 % nasal spray Commonly known as: ASTELIN 1 SPRAY IN EACH NOSTRIL TWICE DAILY AS DIRECTED   calcium carbonate 1250 (500 Ca) MG chewable tablet Commonly known as: OS-CAL Chew 1 tablet by mouth daily.   calcium carbonate 500 MG chewable tablet Commonly known as: TUMS - dosed in mg elemental calcium Chew 1 tablet by mouth as needed.   cetirizine 10 MG tablet Commonly known as: ZYRTEC Take by mouth.   cholecalciferol 25 MCG (1000 UNIT) tablet Commonly known as: VITAMIN D3 Take 1,000 Units by mouth daily.   estradiol 0.5 MG tablet Commonly known as: ESTRACE TAKE 1 TABLET BY MOUTH DAILY.   fentaNYL 25 MCG/HR Commonly known as: DURAGESIC Apply 1 patch to skin every 2 days with fentanyl patch 100 g per hour if tolerated What changed: additional instructions   fentaNYL 100 MCG/HR Commonly known as: DURAGESIC Apply 1 patch to skin every 2 days with fentanyl patch 25 g per hour if tolerated What changed: additional instructions   First-Dukes Mouthwash Susp Swish and spit 5 mLs tid prn   fluticasone 50 MCG/ACT nasal spray Commonly known as: FLONASE Place 2 sprays into both nostrils daily.   furosemide 20 MG tablet Commonly known as: LASIX TAKE 1 TABLET BY MOUTH DAILY AS NEEDED.   lidocaine 5 % Commonly known as: LIDODERM as needed.   multivitamin tablet Take 1 tablet by mouth daily.   naloxone 4 MG/0.1ML Liqd nasal spray kit Commonly known as: NARCAN SMARTSIG:Both Nares   omeprazole 20 MG capsule Commonly known as: PRILOSEC TAKE 1 CAPSULE BY MOUTH TWO TIMES DAILY   ondansetron 4 MG tablet Commonly known as: ZOFRAN Take 1 tablet (4 mg total) by mouth every 6 (six) hours as needed for nausea.   oxyCODONE 5 MG immediate release tablet Commonly known as: Roxicodone Take 1 tablet (5 mg total) by mouth every 4 (four) hours as needed  for severe pain. Limit 1 tab by mouth per day or twice per day if tolerated for breakthrough pain while wearing fentanyl patches What changed:  how much to take how to take this when to take this reasons to take this   senna-docusate 8.6-50 MG  tablet Commonly known as: Senokot-S Take by mouth daily.               Durable Medical Equipment  (From admission, onward)           Start     Ordered   11/07/21 1519  DME Bedside commode  Once       Question:  Patient needs a bedside commode to treat with the following condition  Answer:  Status post revision of total knee replacement, right   11/07/21 1518   11/07/21 1519  DME 3 n 1  Once        11/07/21 1518   11/07/21 1519  DME Walker rolling  Once       Question Answer Comment  Walker: With 5 Inch Wheels   Patient needs a walker to treat with the following condition Status post revision of total knee replacement, right      11/07/21 1518           Diagnostic Studies: DG Knee Right Port  Result Date: 11/07/2021 CLINICAL DATA:  Status post right knee arthroplasty revision. EXAM: PORTABLE RIGHT KNEE - 1-2 VIEW COMPARISON:  CT right knee 02/13/2021 FINDINGS: Interval revision of the prior total right knee arthroplasty. There are moderately long femoral and tibial stems. No perihardware lucency is seen to indicate hardware failure or loosening. There are anterior surgical skin staples. Postoperative changes including subcutaneous and intra-articular air. No acute fracture or dislocation. IMPRESSION: Interval revision of total right knee arthroplasty without evidence of hardware failure. Electronically Signed   By: Yvonne Kendall M.D.   On: 11/07/2021 15:05   VAS [place] LOWER EXTREMITY VENOUS REFLUX  Result Date: 10/30/2021  Lower Venous Reflux Study Patient Name:  BLAISE PALLADINO  Date of Exam:   10/16/2021 Medical Rec #: [id number]     Accession #:    [id number] Date of Birth: Nov 21, 1950    Patient Gender: F Patient Age:   44 years Exam  Location:  [facility] Procedure:      VAS [place] LOWER EXTREMITY VENOUS REFLUX Referring Phys: Eulogio Ditch --------------------------------------------------------------------------------  Indications: Swelling, Pain, and pre-op Rt TKR.  Performing Technologist: Concha Norway RVT  Examination Guidelines: A complete evaluation includes B-mode imaging, spectral Doppler, color Doppler, and power Doppler as needed of all accessible portions of each vessel. Bilateral testing is considered an integral part of a complete examination. Limited examinations for reoccurring indications may be performed as noted. The reflux portion of the exam is performed with the patient in reverse Trendelenburg. Significant venous reflux is defined as >500 ms in the superficial venous system, and >1 second in the deep venous system.   Summary: Right: - No evidence of deep vein thrombosis seen in the right lower extremity, from the common femoral through the popliteal veins. - No evidence of superficial venous thrombosis in the right lower extremity. - There is no evidence of venous reflux seen in the right lower extremity. - No evidence of superficial venous reflux seen in the right greater saphenous vein. - No evidence of superficial venous reflux seen in the right short saphenous vein.  *See table(s) above for measurements and observations. Electronically signed by Hortencia Pilar MD on 10/30/2021 at 5:28:47 PM.    Final    DG Shoulder Right  Result Date: 10/14/2021 CLINICAL DATA:  Bilateral shoulder pain.  No known injury. EXAM: RIGHT SHOULDER - 2+ VIEW; LEFT SHOULDER - 2+ VIEW COMPARISON:  None available FINDINGS: Right shoulder: Surgical clips  noted in the right medial upper arm. Irregularity of the greater tuberosity indicative of rotator cuff tendinopathy. Mild degenerative changes of the acromioclavicular joint. Left shoulder: Irregularity of the lesser tuberosity indicative of rotator cuff tendinopathy. Mild degenerative  changes of the acromioclavicular joint. No fracture or dislocation. IMPRESSION: 1. Radiographic appearance of the shoulders suggestive of rotator cuff tendinopathy. 2. Mild bilateral acromioclavicular osteoarthrosis. Electronically Signed   By: Miachel Roux M.D.   On: 10/14/2021 13:19   DG Shoulder Left  Result Date: 10/14/2021 CLINICAL DATA:  Bilateral shoulder pain.  No known injury. EXAM: RIGHT SHOULDER - 2+ VIEW; LEFT SHOULDER - 2+ VIEW COMPARISON:  None available FINDINGS: Right shoulder: Surgical clips noted in the right medial upper arm. Irregularity of the greater tuberosity indicative of rotator cuff tendinopathy. Mild degenerative changes of the acromioclavicular joint. Left shoulder: Irregularity of the lesser tuberosity indicative of rotator cuff tendinopathy. Mild degenerative changes of the acromioclavicular joint. No fracture or dislocation. IMPRESSION: 1. Radiographic appearance of the shoulders suggestive of rotator cuff tendinopathy. 2. Mild bilateral acromioclavicular osteoarthrosis. Electronically Signed   By: Miachel Roux M.D.   On: 10/14/2021 13:19   DG Lumbar Spine Complete W/Bend  Result Date: 10/14/2021 CLINICAL DATA:  Chronic low back pain with no known injury EXAM: LUMBAR SPINE - COMPLETE WITH BENDING VIEWS COMPARISON:  11/12/2019 FINDINGS: Right upper quadrant surgical clips likely related to prior cholecystectomy. There are 5 lumbar type vertebral bodies. Mild facet degenerative changes at L4-L5 and L5-S1. Grade 1 anterolisthesis of L5 on S1 which is not significantly change with flexion or extension. Vertebral body heights are maintained. No significant disc height loss. IMPRESSION: Mild degenerative changes of the lower lumbar spine, predominately involving the facets. Electronically Signed   By: Miachel Roux M.D.   On: 10/14/2021 13:15   DG Si Joints  Result Date: 10/14/2021 CLINICAL DATA:  Chronic low back pain EXAM: BILATERAL SACROILIAC JOINTS - 3+ VIEW COMPARISON:  CT  abdomen pelvis 03/17/2013 FINDINGS: Minimal symmetric spurring of the sacroiliac joints. The joint space is maintained. No fracture or dislocation.  Visualized soft tissues are normal. IMPRESSION: Minimal bilateral sacroiliac joint osteoarthrosis. Electronically Signed   By: Miachel Roux M.D.   On: 10/14/2021 13:12    Disposition: Plan for discharge home today pending progress with PT.   Follow-up Information     Lattie Corns, PA-C Follow up in 14 day(s).   Specialty: Physician Assistant Why: Electa Sniff information: [place] [address] [phone]                Signed: Judson Roch PA-C 11/08/2021, 9:25 AM

## 2021-11-08 NOTE — Discharge Instructions (Signed)
Diet: As you were doing prior to hospitalization   Shower:  May shower but keep the wounds dry, use an occlusive plastic wrap, NO SOAKING IN TUB.  If the bandage gets wet, change with a clean dry gauze.  Dressing:  You may change your dressing as needed. Change the dressing with sterile gauze dressing.    Activity:  Increase activity slowly as tolerated, but follow the weight bearing instructions below.  No lifting or driving for 6 weeks.  Weight Bearing:   Weight bearing as tolerated to right lower extremity  Pain Medication: Continue using your Fentanyl patches as your were prior to surgery.  I have prescribed Oxycodone '5mg'$  tablets to take every 4 hours as needed for breakthrough pain.  To prevent constipation: you may use a stool softener such as -  Colace (over the counter) 100 mg by mouth twice a day  Drink plenty of fluids (prune juice may be helpful) and high fiber foods Miralax (over the counter) for constipation as needed.    Itching:  If you experience itching with your medications, try taking only a single pain pill, or even half a pain pill at a time.  You may take up to 10 pain pills per day, and you can also use benadryl over the counter for itching or also to help with sleep.   Precautions:  If you experience chest pain or shortness of breath - call 911 immediately for transfer to the hospital emergency department!!  If you develop a fever greater that 101 F, purulent drainage from wound, increased redness or drainage from wound, or calf pain-Call [place]                                              Follow- Up Appointment:  Please call for an appointment to be seen in 2 weeks at [facility]

## 2021-11-08 NOTE — Progress Notes (Signed)
Subjective: 1 Day Post-Op Procedure(s) (LRB): TOTAL KNEE REVISION (Right) Patient reports pain as moderate.   Patient is well, and has had no acute complaints or problems Plan is to go Home after hospital stay. Negative for chest pain and shortness of breath Fever: no Gastrointestinal:Negative for nausea and vomiting  Objective: Vital signs in last 24 hours: Temp:  [97 F (36.1 C)-98.5 F (36.9 C)] 98.5 F (36.9 C) (10/04 0816) Pulse Rate:  [65-90] 78 (10/04 0816) Resp:  [10-20] 20 (10/04 0525) BP: (96-135)/(52-88) 115/61 (10/04 0816) SpO2:  [91 %-100 %] 95 % (10/04 0816) Weight:  [77.1 kg] 77.1 kg (10/03 1544)  Intake/Output from previous day:  Intake/Output Summary (Last 24 hours) at 11/08/2021 0858 Last data filed at 11/08/2021 0607 Gross per 24 hour  Intake 1980.02 ml  Output 1955 ml  Net 25.02 ml    Intake/Output this shift: No intake/output data recorded.  Labs: Recent Labs    11/08/21 0547  HGB 10.6*   Recent Labs    11/08/21 0547  WBC 12.4*  RBC 3.87  HCT 32.8*  PLT 187   Recent Labs    11/08/21 0547  NA 138  K 3.3*  CL 103  CO2 29  BUN 11  CREATININE 0.68  GLUCOSE 163*  CALCIUM 8.6*   No results for input(s): "LABPT", "INR" in the last 72 hours.   EXAM General - Patient is Alert, Appropriate, and Oriented Extremity - ABD soft Neurovascular intact Dorsiflexion/Plantar flexion intact Incision: dressing C/D/I No cellulitis present Compartment soft Dressing/Incision - clean, dry, no drainage noted to the right knee honeycomb dressing. Motor Function - intact, moving foot and toes well on exam.  Abdomen soft with intact bowel sounds. Negative homans to bilateral lower extremities.  Past Medical History:  Diagnosis Date   Anemia    iron deficiency and B12 deficiency   Arthritis    Bronchitis 08/2015   Chronic back pain    followed by Dr Primus Bravo   Depression    Diverticulosis    Esophageal stricture    requiring dilatation x 2    Fibromyalgia    Fibromyalgia    Fibromyalgia    Follicular lymphoma ([place])    Followed by Dr Leretha Pol, s/p chemo and XRT   Follicular lymphoma ([place])    GERD (gastroesophageal reflux disease)    GERD (gastroesophageal reflux disease)    Hemorrhoid    Hiatal hernia    History of kidney stones    Hypercholesterolemia    IBS (irritable bowel syndrome)    IBS (irritable bowel syndrome)    Nephrolithiasis    followed by Dr Bernardo Heater, s/p stents   PONV (postoperative nausea and vomiting)    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   Shingles outbreak 11/26/2011   Sleep apnea     Assessment/Plan: 1 Day Post-Op Procedure(s) (LRB): TOTAL KNEE REVISION (Right) Principal Problem:   Status post revision of total knee replacement, right  Estimated body mass index is 26.63 kg/m as calculated from the following:   Height as of this encounter: '5\' 7"'$  (1.702 m).   Weight as of this encounter: 77.1 kg. Advance diet Up with therapy D/C IV fluids when tolerating po intake.  Labs reviewed this AM, Hg 10.6, WBC 12.4. Denies any N/V this morning, abdomen soft with intact bowels sounds. Up with therapy today.  Plan for discharge home with HHPT pending progress today. Patient does use a fentanyl patch chronically, is seen by pain  management.  Will prescribe oxycodone for home use for breakthrough pain.  DVT Prophylaxis - TED hose and Eliquis Weight-Bearing as tolerated to right leg  J. Cameron Proud, PA-C [facility] 11/08/2021, 8:58 AM

## 2021-11-08 NOTE — Plan of Care (Signed)
  Problem: Education: Goal: Knowledge of General Education information will improve Description: Including pain rating scale, medication(s)/side effects and non-pharmacologic comfort measures Outcome: Progressing   Problem: Clinical Measurements: Goal: Will remain free from infection Outcome: Progressing   Problem: Activity: Goal: Risk for activity intolerance will decrease Outcome: Progressing   

## 2021-11-08 NOTE — Care Management Obs Status (Signed)
[place] NOTIFICATION   Patient Details  Name: Maria Keller MRN: [id number] Date of Birth: 1950-12-26   Medicare Observation Status Notification Given:  Yes    Conception Oms, RN 11/08/2021, 10:32 AM

## 2021-11-09 DIAGNOSIS — Z7901 Long term (current) use of anticoagulants: Secondary | ICD-10-CM | POA: Diagnosis not present

## 2021-11-09 DIAGNOSIS — K5790 Diverticulosis of intestine, part unspecified, without perforation or abscess without bleeding: Secondary | ICD-10-CM | POA: Diagnosis not present

## 2021-11-09 DIAGNOSIS — Z6826 Body mass index (BMI) 26.0-26.9, adult: Secondary | ICD-10-CM | POA: Diagnosis not present

## 2021-11-09 DIAGNOSIS — M797 Fibromyalgia: Secondary | ICD-10-CM | POA: Diagnosis not present

## 2021-11-09 DIAGNOSIS — M1990 Unspecified osteoarthritis, unspecified site: Secondary | ICD-10-CM | POA: Diagnosis not present

## 2021-11-09 DIAGNOSIS — I872 Venous insufficiency (chronic) (peripheral): Secondary | ICD-10-CM | POA: Diagnosis not present

## 2021-11-09 DIAGNOSIS — Z9181 History of falling: Secondary | ICD-10-CM | POA: Diagnosis not present

## 2021-11-09 DIAGNOSIS — G894 Chronic pain syndrome: Secondary | ICD-10-CM | POA: Diagnosis not present

## 2021-11-09 DIAGNOSIS — T84032D Mechanical loosening of internal right knee prosthetic joint, subsequent encounter: Secondary | ICD-10-CM | POA: Diagnosis not present

## 2021-11-09 DIAGNOSIS — C8590 Non-Hodgkin lymphoma, unspecified, unspecified site: Secondary | ICD-10-CM | POA: Diagnosis not present

## 2021-11-09 DIAGNOSIS — E663 Overweight: Secondary | ICD-10-CM | POA: Diagnosis not present

## 2021-11-09 DIAGNOSIS — G4730 Sleep apnea, unspecified: Secondary | ICD-10-CM | POA: Diagnosis not present

## 2021-11-09 DIAGNOSIS — D509 Iron deficiency anemia, unspecified: Secondary | ICD-10-CM | POA: Diagnosis not present

## 2021-11-09 DIAGNOSIS — M5116 Intervertebral disc disorders with radiculopathy, lumbar region: Secondary | ICD-10-CM | POA: Diagnosis not present

## 2021-11-09 DIAGNOSIS — K589 Irritable bowel syndrome without diarrhea: Secondary | ICD-10-CM | POA: Diagnosis not present

## 2021-11-09 DIAGNOSIS — F32A Depression, unspecified: Secondary | ICD-10-CM | POA: Diagnosis not present

## 2021-11-09 DIAGNOSIS — E7800 Pure hypercholesterolemia, unspecified: Secondary | ICD-10-CM | POA: Diagnosis not present

## 2021-11-09 DIAGNOSIS — Z79891 Long term (current) use of opiate analgesic: Secondary | ICD-10-CM | POA: Diagnosis not present

## 2021-11-09 DIAGNOSIS — K219 Gastro-esophageal reflux disease without esophagitis: Secondary | ICD-10-CM | POA: Diagnosis not present

## 2021-11-09 DIAGNOSIS — I890 Lymphedema, not elsewhere classified: Secondary | ICD-10-CM | POA: Diagnosis not present

## 2021-11-09 DIAGNOSIS — G43119 Migraine with aura, intractable, without status migrainosus: Secondary | ICD-10-CM | POA: Diagnosis not present

## 2021-11-09 DIAGNOSIS — E538 Deficiency of other specified B group vitamins: Secondary | ICD-10-CM | POA: Diagnosis not present

## 2021-11-09 DIAGNOSIS — Z87442 Personal history of urinary calculi: Secondary | ICD-10-CM | POA: Diagnosis not present

## 2021-11-11 DIAGNOSIS — I890 Lymphedema, not elsewhere classified: Secondary | ICD-10-CM | POA: Diagnosis not present

## 2021-11-11 DIAGNOSIS — G43119 Migraine with aura, intractable, without status migrainosus: Secondary | ICD-10-CM | POA: Diagnosis not present

## 2021-11-11 DIAGNOSIS — T84032D Mechanical loosening of internal right knee prosthetic joint, subsequent encounter: Secondary | ICD-10-CM | POA: Diagnosis not present

## 2021-11-11 DIAGNOSIS — I872 Venous insufficiency (chronic) (peripheral): Secondary | ICD-10-CM | POA: Diagnosis not present

## 2021-11-11 DIAGNOSIS — M5116 Intervertebral disc disorders with radiculopathy, lumbar region: Secondary | ICD-10-CM | POA: Diagnosis not present

## 2021-11-11 DIAGNOSIS — G894 Chronic pain syndrome: Secondary | ICD-10-CM | POA: Diagnosis not present

## 2021-11-12 LAB — AEROBIC/ANAEROBIC CULTURE W GRAM STAIN (SURGICAL/DEEP WOUND)
Culture: NO GROWTH
Culture: NO GROWTH
Culture: NO GROWTH
Culture: NO GROWTH
Gram Stain: NONE SEEN

## 2021-11-13 DIAGNOSIS — I872 Venous insufficiency (chronic) (peripheral): Secondary | ICD-10-CM | POA: Diagnosis not present

## 2021-11-13 DIAGNOSIS — T84032D Mechanical loosening of internal right knee prosthetic joint, subsequent encounter: Secondary | ICD-10-CM | POA: Diagnosis not present

## 2021-11-13 DIAGNOSIS — G894 Chronic pain syndrome: Secondary | ICD-10-CM | POA: Diagnosis not present

## 2021-11-13 DIAGNOSIS — I890 Lymphedema, not elsewhere classified: Secondary | ICD-10-CM | POA: Diagnosis not present

## 2021-11-13 DIAGNOSIS — M5116 Intervertebral disc disorders with radiculopathy, lumbar region: Secondary | ICD-10-CM | POA: Diagnosis not present

## 2021-11-13 DIAGNOSIS — G43119 Migraine with aura, intractable, without status migrainosus: Secondary | ICD-10-CM | POA: Diagnosis not present

## 2021-11-13 LAB — AEROBIC/ANAEROBIC CULTURE W GRAM STAIN (SURGICAL/DEEP WOUND): Culture: NO GROWTH

## 2021-11-15 DIAGNOSIS — I872 Venous insufficiency (chronic) (peripheral): Secondary | ICD-10-CM | POA: Diagnosis not present

## 2021-11-15 DIAGNOSIS — G894 Chronic pain syndrome: Secondary | ICD-10-CM | POA: Diagnosis not present

## 2021-11-15 DIAGNOSIS — G43119 Migraine with aura, intractable, without status migrainosus: Secondary | ICD-10-CM | POA: Diagnosis not present

## 2021-11-15 DIAGNOSIS — I890 Lymphedema, not elsewhere classified: Secondary | ICD-10-CM | POA: Diagnosis not present

## 2021-11-15 DIAGNOSIS — M5116 Intervertebral disc disorders with radiculopathy, lumbar region: Secondary | ICD-10-CM | POA: Diagnosis not present

## 2021-11-15 DIAGNOSIS — T84032D Mechanical loosening of internal right knee prosthetic joint, subsequent encounter: Secondary | ICD-10-CM | POA: Diagnosis not present

## 2021-11-16 ENCOUNTER — Other Ambulatory Visit: Payer: Self-pay

## 2021-11-16 ENCOUNTER — Telehealth: Payer: Self-pay | Admitting: Internal Medicine

## 2021-11-16 ENCOUNTER — Telehealth (INDEPENDENT_AMBULATORY_CARE_PROVIDER_SITE_OTHER): Payer: Medicare Other | Admitting: Internal Medicine

## 2021-11-16 DIAGNOSIS — R739 Hyperglycemia, unspecified: Secondary | ICD-10-CM | POA: Diagnosis not present

## 2021-11-16 DIAGNOSIS — N3000 Acute cystitis without hematuria: Secondary | ICD-10-CM | POA: Diagnosis not present

## 2021-11-16 DIAGNOSIS — M1711 Unilateral primary osteoarthritis, right knee: Secondary | ICD-10-CM

## 2021-11-16 DIAGNOSIS — R300 Dysuria: Secondary | ICD-10-CM

## 2021-11-16 LAB — URINALYSIS, ROUTINE W REFLEX MICROSCOPIC
Bilirubin Urine: NEGATIVE
Ketones, ur: NEGATIVE
Nitrite: NEGATIVE
Specific Gravity, Urine: <=1.005 — AB (ref 1.000–1.030)
Total Protein, Urine: NEGATIVE
Urine Glucose: NEGATIVE
Urobilinogen, UA: 0.2 (ref 0.0–1.0)
pH: 6 (ref 5.0–8.0)

## 2021-11-16 MED ORDER — NITROFURANTOIN MONOHYD MACRO 100 MG PO CAPS: 100.0000 mg | ORAL_CAPSULE | Freq: Two times a day (BID) | ORAL | 0 refills | Status: DC

## 2021-11-16 NOTE — Progress Notes (Signed)
Patient ID: Maria Keller, female   DOB: 1950-08-06, 71 y.o.   MRN: [id number]   Virtual Visit via video Note  All issues noted in this document were discussed and addressed.  No physical exam was performed (except for noted visual exam findings with Video Visits).   I connected with Kristopher Glee by a video enabled telemedicine application and verified that I am speaking with the correct person using two identifiers. Location patient: home Location provider: work Persons participating in the virtual visit: patient, provider  The limitations, risks, security and privacy concerns of performing an evaluation and management service by video and the availability of in person appointments have been discussed.  It has also been discussed with the patient that there may be a patient responsible charge related to this service. The patient expressed understanding and agreed to proceed.   Reason for visit: work in appt.   HPI: Work in with concerns regarding a possible UTI.  States symptoms started yesterday evening.  Noticed increased urgency and dysuria.  Some abdominal discomfort - lower abdomen.  No fever.  Felt not emptying well.  No nausea or vomiting.  Had recent catheter - with surgery.  Started cranapple tablets.  Feel similar to previous UTI.    ROS: See pertinent positives and negatives per HPI.  Past Medical History:  Diagnosis Date   Anemia    iron deficiency and B12 deficiency   Arthritis    Bronchitis 08/2015   Chronic back pain    followed by Dr Primus Bravo   Depression    Diverticulosis    Esophageal stricture    requiring dilatation x 2   Fibromyalgia    Fibromyalgia    Fibromyalgia    Follicular lymphoma ([place])    Followed by Dr Leretha Pol, s/p chemo and XRT   Follicular lymphoma ([place])    GERD (gastroesophageal reflux disease)    GERD (gastroesophageal reflux disease)    Hemorrhoid    Hiatal hernia    History of kidney stones    Hypercholesterolemia    IBS (irritable bowel  syndrome)    IBS (irritable bowel syndrome)    Nephrolithiasis    followed by Dr Bernardo Heater, s/p stents   PONV (postoperative nausea and vomiting)    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   Shingles outbreak 11/26/2011   Sleep apnea     Past Surgical History:  Procedure Laterality Date   APPENDECTOMY     BLADDER SURGERY  1993   BREAST BIOPSY N/A    capsule endoscopy     CHOLECYSTECTOMY  1995   COLONOSCOPY WITH PROPOFOL N/A 06/19/2017   Procedure: COLONOSCOPY WITH PROPOFOL;  Surgeon: Manya Silvas, MD;  Location: [facility] ENDOSCOPY;  Service: Endoscopy;  Laterality: N/A;   ESOPHAGOGASTRODUODENOSCOPY (EGD) WITH PROPOFOL N/A 06/19/2017   Procedure: ESOPHAGOGASTRODUODENOSCOPY (EGD) WITH PROPOFOL;  Surgeon: Manya Silvas, MD;  Location: [facility] ENDOSCOPY;  Service: Endoscopy;  Laterality: N/A;   EYE SURGERY     both cataract remove   FRACTURE SURGERY Left    ankle, plate with 8 screws   history of helicobacter pylori infection     JOINT REPLACEMENT Right    knee   LEFT OOPHORECTOMY     LEG SURGERY Left    ORIF ANKLE FRACTURE  2012   OVARIAN CYST REMOVAL     right   RECTOCELE REPAIR N/A    REPLACEMENT TOTAL KNEE Right 03/19/2016   TONSILECTOMY/ADENOIDECTOMY WITH MYRINGOTOMY  1970  TONSILLECTOMY N/A    TOTAL KNEE REVISION Right 11/07/2021   Procedure: TOTAL KNEE REVISION;  Surgeon: Corky Mull, MD;  Location: [facility] ORS;  Service: Orthopedics;  Laterality: Right;   TUBAL LIGATION  1976   VAGINAL HYSTERECTOMY  1980's   secondary to bleeding    Family History  Problem Relation Age of Onset   Ulcers Mother    Ulcers Father    Breast cancer Other        great aunt and grandfather   Lung cancer Other        uncle   Hypertension Maternal Grandmother    Heart disease Maternal Grandmother        MI 109s   Diabetes Maternal Grandmother    Arthritis Maternal Grandmother    Diabetes Maternal Grandfather     SOCIAL HX: reviewed.     Current Outpatient Medications:    acetaminophen (TYLENOL) 650 MG CR tablet, Take 650 mg by mouth every 8 (eight) hours as needed for pain., Disp: , Rfl:    apixaban (ELIQUIS) 2.5 MG TABS tablet, Take 1 tablet (2.5 mg total) by mouth 2 (two) times daily., Disp: 30 tablet, Rfl: 0   atorvastatin (LIPITOR) 10 MG tablet, Take 1 tablet (10 mg total) by mouth daily., Disp: 30 tablet, Rfl: 2   azelastine (ASTELIN) 0.1 % nasal spray, 1 SPRAY IN EACH NOSTRIL TWICE DAILY AS DIRECTED, Disp: 30 mL, Rfl: 1   calcium carbonate (OS-CAL) 1250 (500 Ca) MG chewable tablet, Chew 1 tablet by mouth daily., Disp: , Rfl:    calcium carbonate (TUMS - DOSED IN MG ELEMENTAL CALCIUM) 500 MG chewable tablet, Chew 1 tablet by mouth as needed., Disp: , Rfl:    cetirizine (ZYRTEC) 10 MG tablet, Take by mouth., Disp: , Rfl:    cholecalciferol (VITAMIN D3) 25 MCG (1000 UNIT) tablet, Take 1,000 Units by mouth daily., Disp: , Rfl:    estradiol (ESTRACE) 0.5 MG tablet, TAKE 1 TABLET BY MOUTH DAILY., Disp: 90 tablet, Rfl: 1   fentaNYL (DURAGESIC) 100 MCG/HR, Apply 1 patch to skin every 2 days with fentanyl patch 25 g per hour if tolerated (Patient taking differently: Apply 1 patch to skin every 3 days with fentanyl patch 25 g per hour if tolerated), Disp: 15 patch, Rfl: 0   fentaNYL (DURAGESIC) 25 MCG/HR patch, Apply 1 patch to skin every 2 days with fentanyl patch 100 g per hour if tolerated (Patient taking differently: Apply 1 patch to skin every 3 days with fentanyl patch 100 g per hour if tolerated), Disp: 15 patch, Rfl: 0   fluticasone (FLONASE) 50 MCG/ACT nasal spray, Place 2 sprays into both nostrils daily., Disp: 48 g, Rfl: 3   furosemide (LASIX) 20 MG tablet, TAKE 1 TABLET BY MOUTH DAILY AS NEEDED., Disp: 90 tablet, Rfl: 0   Multiple Vitamin (MULTIVITAMIN) tablet, Take 1 tablet by mouth daily., Disp: , Rfl:    naloxone (NARCAN) nasal spray 4 mg/0.1 mL, SMARTSIG:Both Nares, Disp: , Rfl:    nitrofurantoin,  macrocrystal-monohydrate, (MACROBID) 100 MG capsule, Take 1 capsule (100 mg total) by mouth 2 (two) times daily., Disp: 10 capsule, Rfl: 0   omeprazole (PRILOSEC) 20 MG capsule, TAKE 1 CAPSULE BY MOUTH TWO TIMES DAILY, Disp: 180 capsule, Rfl: 2   ondansetron (ZOFRAN) 4 MG tablet, Take 1 tablet (4 mg total) by mouth every 6 (six) hours as needed for nausea., Disp: 30 tablet, Rfl: 0   oxyCODONE (ROXICODONE) 5 MG immediate release tablet, Take 1 tablet (5 mg  total) by mouth every 4 (four) hours as needed for severe pain. Limit 1 tab by mouth per day or twice per day if tolerated for breakthrough pain while wearing fentanyl patches, Disp: 30 tablet, Rfl: 0   senna-docusate (SENOKOT-S) 8.6-50 MG per tablet, Take by mouth daily., Disp: , Rfl:   EXAM:  GENERAL: alert, oriented, appears well and in no acute distress  HEENT: atraumatic, conjunttiva clear, no obvious abnormalities on inspection of external nose and ears  NECK: normal movements of the head and neck  LUNGS: on inspection no signs of respiratory distress, breathing rate appears normal, no obvious gross SOB, gasping or wheezing  CV: no obvious cyanosis  PSYCH/NEURO: pleasant and cooperative, no obvious depression or anxiety, speech and thought processing grossly intact  ASSESSMENT AND PLAN:  Discussed the following assessment and plan:  Problem List Items Addressed This Visit     Dysuria   Hyperglycemia    Low carb diet and exercise.  Follow met b and a1c.      Primary osteoarthritis of right knee    S/p total knee revision 11/2021.  Doing well.  Treat infection.  Follow.       UTI (urinary tract infection)    Symptoms and urinalysis appear to be c/w UTI.  Treat with macrobid as directed. Probiotics as directed.  Stay hydrated.  Follow.  Culture pending.        Relevant Medications   nitrofurantoin, macrocrystal-monohydrate, (MACROBID) 100 MG capsule    Return if symptoms worsen or fail to improve.   I discussed the  assessment and treatment plan with the patient. The patient was provided an opportunity to ask questions and all were answered. The patient agreed with the plan and demonstrated an understanding of the instructions.   The patient was advised to call back or seek an in-person evaluation if the symptoms worsen or if the condition fails to improve as anticipated.    Einar Pheasant, MD

## 2021-11-16 NOTE — Telephone Encounter (Signed)
Pt called stating she has frequent urination and burning and pt can not come in because she had surgery. Pt want to know if her husband can bring a specimen to the office today

## 2021-11-16 NOTE — Telephone Encounter (Signed)
Pt called and she did have sterile cup at home from last UTI she had & wipes. Instructed to put name/DOB on cup before returned. Husband will bring sample & she is scheduled for VV today at 4:30p.

## 2021-11-16 NOTE — Telephone Encounter (Signed)
Ok to bring urine and schedule for virtual visit this pm  will need urine dip, micro and culture

## 2021-11-17 DIAGNOSIS — G894 Chronic pain syndrome: Secondary | ICD-10-CM | POA: Diagnosis not present

## 2021-11-17 DIAGNOSIS — G43119 Migraine with aura, intractable, without status migrainosus: Secondary | ICD-10-CM | POA: Diagnosis not present

## 2021-11-17 DIAGNOSIS — I890 Lymphedema, not elsewhere classified: Secondary | ICD-10-CM | POA: Diagnosis not present

## 2021-11-17 DIAGNOSIS — M5116 Intervertebral disc disorders with radiculopathy, lumbar region: Secondary | ICD-10-CM | POA: Diagnosis not present

## 2021-11-17 DIAGNOSIS — T84032D Mechanical loosening of internal right knee prosthetic joint, subsequent encounter: Secondary | ICD-10-CM | POA: Diagnosis not present

## 2021-11-17 DIAGNOSIS — I872 Venous insufficiency (chronic) (peripheral): Secondary | ICD-10-CM | POA: Diagnosis not present

## 2021-11-19 ENCOUNTER — Encounter: Payer: Self-pay | Admitting: Internal Medicine

## 2021-11-19 DIAGNOSIS — N390 Urinary tract infection, site not specified: Secondary | ICD-10-CM | POA: Insufficient documentation

## 2021-11-19 NOTE — Assessment & Plan Note (Signed)
Symptoms and urinalysis appear to be c/w UTI.  Treat with macrobid as directed. Probiotics as directed.  Stay hydrated.  Follow.  Culture pending.

## 2021-11-19 NOTE — Assessment & Plan Note (Signed)
S/p total knee revision 11/2021.  Doing well.  Treat infection.  Follow.

## 2021-11-19 NOTE — Assessment & Plan Note (Signed)
Low carb diet and exercise.  Follow met b and a1c.

## 2021-11-20 DIAGNOSIS — G894 Chronic pain syndrome: Secondary | ICD-10-CM | POA: Diagnosis not present

## 2021-11-20 DIAGNOSIS — G43119 Migraine with aura, intractable, without status migrainosus: Secondary | ICD-10-CM | POA: Diagnosis not present

## 2021-11-20 DIAGNOSIS — I872 Venous insufficiency (chronic) (peripheral): Secondary | ICD-10-CM | POA: Diagnosis not present

## 2021-11-20 DIAGNOSIS — T84032D Mechanical loosening of internal right knee prosthetic joint, subsequent encounter: Secondary | ICD-10-CM | POA: Diagnosis not present

## 2021-11-20 DIAGNOSIS — M5116 Intervertebral disc disorders with radiculopathy, lumbar region: Secondary | ICD-10-CM | POA: Diagnosis not present

## 2021-11-20 DIAGNOSIS — I890 Lymphedema, not elsewhere classified: Secondary | ICD-10-CM | POA: Diagnosis not present

## 2021-11-20 LAB — URINE CULTURE
MICRO NUMBER:: 14042941
SPECIMEN QUALITY:: ADEQUATE

## 2021-11-21 ENCOUNTER — Other Ambulatory Visit: Payer: Self-pay | Admitting: Internal Medicine

## 2021-11-21 ENCOUNTER — Encounter: Payer: Self-pay | Admitting: Internal Medicine

## 2021-11-21 DIAGNOSIS — R319 Hematuria, unspecified: Secondary | ICD-10-CM

## 2021-11-21 NOTE — Progress Notes (Signed)
Order placed for f/u urinalysis.

## 2021-11-22 DIAGNOSIS — Z96651 Presence of right artificial knee joint: Secondary | ICD-10-CM | POA: Diagnosis not present

## 2021-11-22 DIAGNOSIS — M25561 Pain in right knee: Secondary | ICD-10-CM | POA: Diagnosis not present

## 2021-11-29 DIAGNOSIS — Z96651 Presence of right artificial knee joint: Secondary | ICD-10-CM | POA: Diagnosis not present

## 2021-11-29 DIAGNOSIS — M25561 Pain in right knee: Secondary | ICD-10-CM | POA: Diagnosis not present

## 2021-11-30 ENCOUNTER — Other Ambulatory Visit: Payer: Self-pay | Admitting: Internal Medicine

## 2021-12-01 DIAGNOSIS — Z96651 Presence of right artificial knee joint: Secondary | ICD-10-CM | POA: Diagnosis not present

## 2021-12-01 DIAGNOSIS — M25561 Pain in right knee: Secondary | ICD-10-CM | POA: Diagnosis not present

## 2021-12-04 ENCOUNTER — Ambulatory Visit: Payer: Medicare Other | Attending: Pain Medicine | Admitting: Pain Medicine

## 2021-12-04 ENCOUNTER — Encounter (INDEPENDENT_AMBULATORY_CARE_PROVIDER_SITE_OTHER): Payer: Self-pay

## 2021-12-04 ENCOUNTER — Encounter: Payer: Self-pay | Admitting: Pain Medicine

## 2021-12-04 ENCOUNTER — Other Ambulatory Visit (INDEPENDENT_AMBULATORY_CARE_PROVIDER_SITE_OTHER): Payer: Medicare Other

## 2021-12-04 VITALS — BP 112/67 | HR 81 | Temp 96.8°F | Resp 16 | Ht 67.0 in | Wt 175.0 lb

## 2021-12-04 DIAGNOSIS — Z79899 Other long term (current) drug therapy: Secondary | ICD-10-CM | POA: Diagnosis present

## 2021-12-04 DIAGNOSIS — F1190 Opioid use, unspecified, uncomplicated: Secondary | ICD-10-CM | POA: Diagnosis present

## 2021-12-04 DIAGNOSIS — M25512 Pain in left shoulder: Secondary | ICD-10-CM | POA: Insufficient documentation

## 2021-12-04 DIAGNOSIS — M545 Low back pain, unspecified: Secondary | ICD-10-CM | POA: Diagnosis not present

## 2021-12-04 DIAGNOSIS — M4317 Spondylolisthesis, lumbosacral region: Secondary | ICD-10-CM | POA: Diagnosis present

## 2021-12-04 DIAGNOSIS — M67919 Unspecified disorder of synovium and tendon, unspecified shoulder: Secondary | ICD-10-CM | POA: Diagnosis present

## 2021-12-04 DIAGNOSIS — M25511 Pain in right shoulder: Secondary | ICD-10-CM | POA: Diagnosis present

## 2021-12-04 DIAGNOSIS — R319 Hematuria, unspecified: Secondary | ICD-10-CM

## 2021-12-04 DIAGNOSIS — M19012 Primary osteoarthritis, left shoulder: Secondary | ICD-10-CM | POA: Diagnosis present

## 2021-12-04 DIAGNOSIS — M47817 Spondylosis without myelopathy or radiculopathy, lumbosacral region: Secondary | ICD-10-CM | POA: Diagnosis present

## 2021-12-04 DIAGNOSIS — Z79891 Long term (current) use of opiate analgesic: Secondary | ICD-10-CM | POA: Diagnosis present

## 2021-12-04 DIAGNOSIS — M461 Sacroiliitis, not elsewhere classified: Secondary | ICD-10-CM | POA: Diagnosis present

## 2021-12-04 DIAGNOSIS — Z96651 Presence of right artificial knee joint: Secondary | ICD-10-CM | POA: Diagnosis not present

## 2021-12-04 DIAGNOSIS — M5137 Other intervertebral disc degeneration, lumbosacral region: Secondary | ICD-10-CM | POA: Insufficient documentation

## 2021-12-04 DIAGNOSIS — M533 Sacrococcygeal disorders, not elsewhere classified: Secondary | ICD-10-CM | POA: Insufficient documentation

## 2021-12-04 DIAGNOSIS — M79652 Pain in left thigh: Secondary | ICD-10-CM | POA: Insufficient documentation

## 2021-12-04 DIAGNOSIS — G894 Chronic pain syndrome: Secondary | ICD-10-CM | POA: Diagnosis not present

## 2021-12-04 DIAGNOSIS — M19011 Primary osteoarthritis, right shoulder: Secondary | ICD-10-CM | POA: Diagnosis present

## 2021-12-04 DIAGNOSIS — M25561 Pain in right knee: Secondary | ICD-10-CM | POA: Insufficient documentation

## 2021-12-04 DIAGNOSIS — G8929 Other chronic pain: Secondary | ICD-10-CM | POA: Diagnosis present

## 2021-12-04 DIAGNOSIS — M79651 Pain in right thigh: Secondary | ICD-10-CM | POA: Insufficient documentation

## 2021-12-04 DIAGNOSIS — E7800 Pure hypercholesterolemia, unspecified: Secondary | ICD-10-CM | POA: Diagnosis not present

## 2021-12-04 DIAGNOSIS — R739 Hyperglycemia, unspecified: Secondary | ICD-10-CM

## 2021-12-04 DIAGNOSIS — M51379 Other intervertebral disc degeneration, lumbosacral region without mention of lumbar back pain or lower extremity pain: Secondary | ICD-10-CM | POA: Insufficient documentation

## 2021-12-04 DIAGNOSIS — M778 Other enthesopathies, not elsewhere classified: Secondary | ICD-10-CM | POA: Insufficient documentation

## 2021-12-04 DIAGNOSIS — T50905S Adverse effect of unspecified drugs, medicaments and biological substances, sequela: Secondary | ICD-10-CM | POA: Diagnosis present

## 2021-12-04 LAB — URINALYSIS, ROUTINE W REFLEX MICROSCOPIC
Hgb urine dipstick: NEGATIVE
Ketones, ur: NEGATIVE
Leukocytes,Ua: NEGATIVE
Nitrite: NEGATIVE
Specific Gravity, Urine: >=1.03 — AB (ref 1.000–1.030)
Total Protein, Urine: 30 — AB
Urine Glucose: NEGATIVE
Urobilinogen, UA: 1 (ref 0.0–1.0)
pH: 6 (ref 5.0–8.0)

## 2021-12-04 LAB — BASIC METABOLIC PANEL
BUN: 14 mg/dL (ref 6–23)
CO2: 29 mEq/L (ref 19–32)
Calcium: 8.7 mg/dL (ref 8.4–10.5)
Chloride: 104 mEq/L (ref 96–112)
Creatinine, Ser: 0.64 mg/dL (ref 0.40–1.20)
GFR: 87.92 mL/min (ref >60.00)
Glucose, Bld: 105 mg/dL — ABNORMAL HIGH (ref 70–99)
Potassium: 4.4 mEq/L (ref 3.5–5.1)
Sodium: 140 mEq/L (ref 135–145)

## 2021-12-04 LAB — HEPATIC FUNCTION PANEL
ALT: 14 U/L (ref 0–35)
AST: 27 U/L (ref 0–37)
Albumin: 3.7 g/dL (ref 3.5–5.2)
Alkaline Phosphatase: 102 U/L (ref 39–117)
Bilirubin, Direct: 0.1 mg/dL (ref 0.0–0.3)
Total Bilirubin: 0.3 mg/dL (ref 0.2–1.2)
Total Protein: 6.1 g/dL (ref 6.0–8.3)

## 2021-12-04 LAB — LIPID PANEL
Cholesterol: 151 mg/dL (ref 0–200)
HDL: 50 mg/dL (ref >39.00)
LDL Cholesterol: 74 mg/dL (ref 0–99)
NonHDL: 101.32
Total CHOL/HDL Ratio: 3
Triglycerides: 137 mg/dL (ref 0.0–149.0)
VLDL: 27.4 mg/dL (ref 0.0–40.0)

## 2021-12-04 LAB — HEMOGLOBIN A1C: Hgb A1c MFr Bld: 5.1 % (ref 4.6–6.5)

## 2021-12-04 MED ORDER — FENTANYL 100 MCG/HR TD PT72: 1.0000 | MEDICATED_PATCH | TRANSDERMAL | 0 refills | Status: DC

## 2021-12-04 MED ORDER — FENTANYL 12 MCG/HR TD PT72: 1.0000 | MEDICATED_PATCH | TRANSDERMAL | 0 refills | Status: DC

## 2021-12-04 MED ORDER — OXYCODONE HCL 5 MG PO TABS: 5.0000 mg | ORAL_TABLET | Freq: Four times a day (QID) | ORAL | 0 refills | Status: DC

## 2021-12-04 MED ORDER — NALOXONE HCL 4 MG/0.1ML NA LIQD: 1.0000 | NASAL | 0 refills | Status: DC | PRN

## 2021-12-04 NOTE — Progress Notes (Deleted)
Safety precautions to be maintained throughout the outpatient stay will include: orient to surroundings, keep bed in low position, maintain call bell within reach at all times, provide assistance with transfer out of bed and ambulation.  

## 2021-12-04 NOTE — Patient Instructions (Addendum)
Downward tapering instructions: Keep the current 25 mcg patch for a total of 6 days. When the time comes to replace the 25 mcg patch, replace it with a 12.5 mcg/h patch x1. Keep the 12.5 mcg/hr patch on x 6 days, then remove it and do not replace it with another. Continue using the 100 mcg/h patch as instructed, changing the patch every 3 days (72 hours). Continue taking the oxycodone IR 5 mg, 1 tablet by mouth, no more than 4 times a day.  If you feel that you do not need to take the oxycodone, you may skip a dose.  Note: You will receive #10 of the 12.5 mcg/hr patches. Do not use them until Dr. Dossie Arbour instructs you when and how.  ____________________________________________________________________________________________  Patient Information update  To: All of our patients.  Re: Name change.  It has been made official that our current name, "[facility]"   will soon be changed to "[facility]".   The purpose of this change is to eliminate any confusion created by the concept of our practice being a "Medication Management Pain Clinic". In the past this has led to the misconception that we treat pain primarily by the use of prescription medications.  Nothing can be farther from the truth.   Understanding PAIN MANAGEMENT: To further understand what our practice does, you first have to understand that "Pain Management" is a subspecialty that requires additional training once a physician has completed their specialty training, which can be in either Anesthesia, Neurology, Psychiatry, or Physical Medicine and Rehabilitation (PMR). Each one of these contributes to the final approach taken by each physician to the management of their patient's pain. To be a "Pain Management Specialist" you must have first completed one of the specialty trainings below.  Anesthesiologists - trained in  clinical pharmacology and interventional techniques such as nerve blockade and regional as well as central neuroanatomy. They are trained to block pain before, during, and after surgical interventions.  Neurologists - trained in the diagnosis and pharmacological treatment of complex neurological conditions, such as Multiple Sclerosis, Parkinson's, spinal cord injuries, and other systemic conditions that may be associated with symptoms that may include but are not limited to pain. They tend to rely primarily on the treatment of chronic pain using prescription medications.  Psychiatrist - trained in conditions affecting the psychosocial wellbeing of patients including but not limited to depression, anxiety, schizophrenia, personality disorders, addiction, and other substance use disorders that may be associated with chronic pain. They tend to rely primarily on the treatment of chronic pain using prescription medications.   Physical Medicine and Rehabilitation (PMR) physicians, also known as physiatrists - trained to treat a wide variety of medical conditions affecting the brain, spinal cord, nerves, bones, joints, ligaments, muscles, and tendons. Their training is primarily aimed at treating patients that have suffered injuries that have caused severe physical impairment. Their training is primarily aimed at the physical therapy and rehabilitation of those patients. They may also work alongside orthopedic surgeons or neurosurgeons using their expertise in assisting surgical patients to recover after their surgeries.  INTERVENTIONAL PAIN MANAGEMENT is sub-subspecialty of Pain Management.  Our physicians are Board-certified in Anesthesia, Pain Management, and Interventional Pain Management.  This meaning that not only have they been trained and Board-certified in their specialty of Anesthesia, and subspecialty of Pain Management, but they have also received further training in the sub-subspecialty of  Interventional Pain Management, in  order to become Board-certified as INTERVENTIONAL PAIN MANAGEMENT SPECIALIST.    Mission: Our goal is to use our skills in  [place] as alternatives to the chronic use of prescription opioid medications for the treatment of pain. To make this more clear, we have changed our name to reflect what we do and offer. We will continue to offer medication management assessment and recommendations, but we will not be taking over any patient's medication management.  ____________________________________________________________________________________________  _______________________________________________________________________  Medication Rules  Purpose: To inform patients, and their family members, of our medication rules and regulations.  Applies to: All patients receiving prescriptions from our practice (written or electronic).  Pharmacy of record: This is the pharmacy where your electronic prescriptions will be sent. Make sure we have the correct one.  Electronic prescriptions: In compliance with the [place] (STOP) Act of 2017 (Session Lanny Cramp [phone]), effective February 05, 2018, all controlled substances must be electronically prescribed. Written prescriptions, faxing, or calling prescriptions to a pharmacy will no longer be done.  Prescription refills: These will be provided only during in-person appointments. No medications will be renewed without a "face-to-face" evaluation with your provider. Applies to all prescriptions.  NOTE: The following applies primarily to controlled substances (Opioid* Pain Medications).   Type of encounter (visit): For patients receiving controlled substances, face-to-face visits are required. (Not an option and not up to the patient.)  Patient's responsibilities: Pain Pills: Bring all pain pills to every appointment (except for procedure appointments). Pill  Bottles: Bring pills in original pharmacy bottle. Bring bottle, even if empty. Always bring the bottle of the most recent fill.  Medication refills: You are responsible for knowing and keeping track of what medications you are taking and when is it that you will need a refill. The day before your appointment: write a list of all prescriptions that need to be refilled. The day of the appointment: give the list to the admitting nurse. Prescriptions will be written only during appointments. No prescriptions will be written on procedure days. If you forget a medication: it will not be "Called in", "Faxed", or "electronically sent". You will need to get another appointment to get these prescribed. No early refills. Do not call asking to have your prescription filled early. Partial  or short prescriptions: Occasionally your pharmacy may not have enough pills to fill your prescription.  NEVER ACCEPT a partial fill or a prescription that is short of the total amount of pills that you were prescribed.  With controlled substances the law allows 72 hours for the pharmacy to complete the prescription.  If the prescription is not completed within 72 hours, the pharmacist will require a new prescription to be written. This means that you will be short on your medicine and we WILL NOT send another prescription to complete your original prescription.  Instead, request the pharmacy to send a carrier to a nearby branch to get enough medication to provide you with your full prescription. Prescription Accuracy: You are responsible for carefully inspecting your prescriptions before leaving our office. Have the discharge nurse carefully go over each prescription with you, before taking them home. Make sure that your name is accurately spelled, that your address is correct. Check the name and dose of your medication to make sure it is accurate. Check the number of pills, and the written instructions to make sure they are clear and  accurate. Make sure that you are given enough medication to last until your next medication  refill appointment. Taking Medication: Take medication as prescribed. When it comes to controlled substances, taking less pills or less frequently than prescribed is permitted and encouraged. Never take more pills than instructed. Never take the medication more frequently than prescribed.  Inform other Doctors: Always inform, all of your healthcare providers, of all the medications you take. Pain Medication from other Providers: You are not allowed to accept any additional pain medication from any other Doctor or Healthcare provider. There are two exceptions to this rule. (see below) In the event that you require additional pain medication, you are responsible for notifying us, as stated below. Cough Medicine: Often these contain an opioid, such as codeine or hydrocodone. Never accept or take cough medicine containing these opioids if you are already taking an opioid* medication. The combination may cause respiratory failure and death. Medication Agreement: You are responsible for carefully reading and following our Medication Agreement. This must be signed before receiving any prescriptions from our practice. Safely store a copy of your signed Agreement. Violations to the Agreement will result in no further prescriptions. (Additional copies of our Medication Agreement are available upon request.) Laws, Rules, & Regulations: All patients are expected to follow all Federal and [employer], Rules, [employer]. Ignorance of the Laws does not constitute a valid excuse.  Illegal drugs and Controlled Substances: The use of illegal substances (including, but not limited to marijuana and its derivatives) and/or the illegal use of any controlled substances is strictly prohibited. Violation of this rule may result in the immediate and permanent discontinuation of any and all prescriptions being written by our  practice. The use of any illegal substances is prohibited. Adopted CDC guidelines & recommendations: Target dosing levels will be at or below 60 MME/day. Use of benzodiazepines** is not recommended.  Exceptions: There are only two exceptions to the rule of not receiving pain medications from other Healthcare Providers. Exception #1 (Emergencies): In the event of an emergency (i.e.: accident requiring emergency care), you are allowed to receive additional pain medication. However, you are responsible for: As soon as you are able, call our office [phone], at any time of the day or night, and leave a message stating your name, the date and nature of the emergency, and the name and dose of the medication prescribed. In the event that your call is answered by a member of our staff, make sure to document and save the date, time, and the name of the person that took your information.  Exception #2 (Planned Surgery): In the event that you are scheduled by another doctor or dentist to have any type of surgery or procedure, you are allowed (for a period no longer than 30 days), to receive additional pain medication, for the acute post-op pain. However, in this case, you are responsible for picking up a copy of our "Post-op Pain Management for Surgeons" handout, and giving it to your surgeon or dentist. This document is available at our office, and does not require an appointment to obtain it. Simply go to our office during business hours (Monday-Thursday from 8:00 AM to 4:00 PM) (Friday 8:00 AM to 12:00 Noon) or if you have a scheduled appointment with [place], prior to your surgery, and ask for it by name. In addition, you are responsible for: calling our office [phone], at any time of the day or night, and leaving a message stating your name, name of your surgeon, type of surgery, and date of procedure or surgery. Failure to comply  with your responsibilities may result in termination of therapy involving the  controlled substances. Medication Agreement Violation. Following the above rules, including your responsibilities will help you in avoiding a Medication Agreement Violation ("Breaking your Pain Medication Contract").  Consequences:  Not following the above rules may result in permanent discontinuation of medication prescription therapy.  *Opioid medications include: morphine, codeine, oxycodone, oxymorphone, hydrocodone, hydromorphone, meperidine, tramadol, tapentadol, buprenorphine, fentanyl, methadone. **Benzodiazepine medications include: diazepam (Valium), alprazolam (Xanax), clonazepam (Klonopine), lorazepam (Ativan), clorazepate (Tranxene), chlordiazepoxide (Librium), estazolam (Prosom), oxazepam (Serax), temazepam (Restoril), triazolam (Halcion) (Last updated: 11/28/2021) ______________________________________________________________________   ______________________________________________________________________  Medication Recommendations and Reminders  Applies to: All patients receiving prescriptions (written and/or electronic).  Medication Rules & Regulations: You are responsible for reading, knowing, and following our "Medication Rules" document. These exist for your safety and that of others. They are not flexible and neither are we. Dismissing or ignoring them is an act of "non-compliance" that may result in complete and irreversible termination of such medication therapy. For safety reasons, "non-compliance" will not be tolerated. As with the [place] fundamental legal principle of "ignorance of the law is no defense", we will accept no excuses for not having read and knowing the content of documents provided to you by our practice.  Pharmacy of record:  Definition: This is the pharmacy where your electronic prescriptions will be sent.  We do not endorse any particular pharmacy. It is up to you and your insurance to decide what pharmacy to use.  We do not restrict you in your choice  of pharmacy. However, once we write for your prescriptions, we will NOT be re-sending more prescriptions to fix restricted supply problems created by your pharmacy, or your insurance.  The pharmacy listed in the electronic medical record should be the one where you want electronic prescriptions to be sent. If you choose to change pharmacy, simply notify our nursing staff. Changes will be made only during your regular appointments and not over the phone.  Recommendations: Keep all of your pain medications in a safe place, under lock and key, even if you live alone. We will NOT replace lost, stolen, or damaged medication. We do not accept "Police Reports" as proof of medications having been stolen. After you fill your prescription, take 1 week's worth of pills and put them away in a safe place. You should keep a separate, properly labeled bottle for this purpose. The remainder should be kept in the original bottle. Use this as your primary supply, until it runs out. Once it's gone, then you know that you have 1 week's worth of medicine, and it is time to come in for a prescription refill. If you do this correctly, it is unlikely that you will ever run out of medicine. To make sure that the above recommendation works, it is very important that you make sure your medication refill appointments are scheduled at least 1 week before you run out of medicine. To do this in an effective manner, make sure that you do not leave the office without scheduling your next medication management appointment. Always ask the nursing staff to show you in your prescription , when your medication will be running out. Then arrange for the receptionist to get you a return appointment, at least 7 days before you run out of medicine. Do not wait until you have 1 or 2 pills left, to come in. This is very poor planning and does not take into consideration that we may need to cancel appointments due to  bad weather, sickness, or emergencies  affecting our staff. DO NOT ACCEPT A "Partial Fill": If for any reason your pharmacy does not have enough pills/tablets to completely fill or refill your prescription, do not allow for a "partial fill". The law allows the pharmacy to complete that prescription within 72 hours, without requiring a new prescription. If they do not fill the rest of your prescription within those 72 hours, you will need a separate prescription to fill the remaining amount, which we will NOT provide. If the reason for the partial fill is your insurance, you will need to talk to the pharmacist about payment alternatives for the remaining tablets, but again, DO NOT ACCEPT A PARTIAL FILL, unless you can trust your pharmacist to obtain the remainder of the pills within 72 hours.  Prescription refills and/or changes in medication(s):  Prescription refills, and/or changes in dose or medication, will be conducted only during scheduled medication management appointments. (Applies to both, written and electronic prescriptions.) No refills on procedure days. No medication will be changed or started on procedure days. No changes, adjustments, and/or refills will be conducted on a procedure day. Doing so will interfere with the diagnostic portion of the procedure. No phone refills. No medications will be "called into the pharmacy". No Fax refills. No weekend refills. No Holliday refills. No after hours refills.  Remember:  Business hours are:  Monday to Thursday 8:00 AM to 4:00 PM Provider's Schedule: Milinda Pointer, MD - Appointments are:  Medication management: Monday and Wednesday 8:00 AM to 4:00 PM Procedure day: Tuesday and Thursday 7:30 AM to 4:00 PM Gillis Santa, MD - Appointments are:  Medication management: Tuesday and Thursday 8:00 AM to 4:00 PM Procedure day: Monday and Wednesday 7:30 AM to 4:00 PM (Last update: 11/28/2021) ______________________________________________________________________   ____________________________________________________________________________________________  Pharmacy Shortages of Pain Medication   Introduction Shockingly as it may seem, .  "No U.S. Supreme Court decision has ever interpreted the Constitution as guaranteeing a right to health care for all Americans." - https://huff.com/  "With respect to human rights, the [place] has no formally codified right to health, nor does it participate in a human rights treaty that specifies a right to health." - Scott J. Schweikart, JD, MBE  Situation By now, most of our patients have had the experience of being told by their pharmacist that they do not have enough medication to cover their prescription. If you have not had this experience, just know that you soon will.  Problem There appears to be a shortage of these medications, either at the national level or locally. This is happening with all pharmacies. When there is not enough medication, patients are offered a partial fill and they are told that they will try to get the rest of the medicine for them at a later time. If they do not have enough for even a partial fill, the pharmacists are telling the patients to call us (the prescribing physicians) to request that we send another prescription to another pharmacy to get the medicine.   This reordering of a controlled substance creates documentation problems where additional paperwork needs to be created to explain why two prescriptions for the same period of time and the same medicine are being prescribed to the same patient. It also creates situations where the last appointment note does not accurately reflect when and what prescriptions were given to a patient. This leads to prescribing errors down the line, in subsequent follow-up visits.   [employer] of Pharmacy (  [employer]) Research revealed that Board of Pharmacy Rule .1806 (21  NCAC 46.1806) authorizes pharmacists to the transfer of prescriptions among pharmacies, and it sets forth procedural and recordkeeping requirements for doing so. However, this requires the pharmacist to complete the previously mentioned procedural paperwork to accomplish the transfer. As it turns out, it is much easier for them to have the prescribing physicians do the work.   Possible solutions 1. You can ask your physician to assist you in weaning yourself off these medications. 2. Ask your pharmacy if the medication is in stock, 3 days prior to your refill. 3. If you need a pharmacy change, let us know at your medication management visit. Prescriptions that have already been electronically sent to a pharmacy will not be re-sent to a different pharmacy if your pharmacy of record does not have it in stock. Proper stocking of medication is a pharmacy problem, not a prescriber problem. Work with your pharmacist to solve the problem. 4. Have the [facility] Assembly add a provision to the "STOP ACT" (the law that mandates how controlled substances are prescribed) where there is an exception to the electronic prescribing rule that states that in the event there are shortages of medications the physicians are allowed to use written prescriptions as opposed to electronic ones. This would allow patients to take their prescriptions to a different pharmacy that may have enough medication available to fill the prescription. The problem is that currently there is a law that does not allow for written prescriptions, with the exception of instances where the electronic medical record is down due to technical issues.  5. Have [place] Congress ease the pressure on pharmaceutical companies, allowing them to produce enough quantities of the medication to adequately supply the population. 6. Have pharmacies keep enough stocks of these medications to cover their client base.  7. Have the [facility] Assembly add  a provision to the "STOP ACT" where they ease the regulations surrounding the transfer of controlled substances between pharmacies, so as to simplify the transfer of supplies. As an alternative, develop a system to allow patients to obtain the remainder of their prescription at another one of their pharmacies or at an associate pharmacy.   How this shortage will affect you.  Understand that this is a pharmacy supply problem, not a prescriber problem. Work with your pharmacy to solve it. The job of the prescriber is to evaluate and monitor the patient for the appropriate indications and use of these medicines. It is not the job of the prescriber to supply the medication or to solve problems with that supply. The responsibility and the choice to obtain the medication resides on the patient. By law, supplying the medication is the job of the pharmacy. It is certainly not the job of the prescriber to solve supply problems.   Due to the above problems we are no longer taking patients to write for their pain medication. Future discussions with your physician may include potentially weaning medications or transitioning to alternatives.  We will be focusing primarily on interventional based pain management. We will continue to evaluate for appropriate indications and we may provide recommendations regarding medication, dose, and schedule, as well as monitoring recommendations, however, we will not be taking over the actual prescribing of these substances. On those patients where we are treating their chronic pain with interventional therapies, exceptions will be considered on a case by case basis. At this time, we will try to continue providing this supplemental service  to those patients we have been managing in the past. However, as of August 1st, 2023, we no longer will be sending additional prescriptions to other pharmacies for the purpose of solving their supply problems. Once we send a prescription to a pharmacy,  we will not be resending it again to another pharmacy to cover for their shortages.   What to do. Write as many letters as you can. Recruit the help of family members in writing these letters. Below are some of the places where you can write to make your voice heard. Let them know what the problem is and push them to look for solutions.   Search internet for: "[employer] find your legislators" NoseSwap.is  Search internet for: "[employer] commissioner complaints" Starlas.fi  Search internet for: "[place] complaints" https://www.hernandez-brewer.com/.htm  Search internet for: "[facility] pharmacy complaints" Email [facility] Customer Relations woondaal.com.jsp?callType=store  Search internet for: Programme researcher, broadcasting/film/video customer service complaints" https://www.[email or web address]/topic/marketing/contactus/contactus_[email or web address]  ____________________________________________________________________________________________  ____________________________________________________________________________________________  Drug Holidays (Slow)  What is a "Drug Holiday"? Drug Holiday: is the name given to the period of time during which a patient stops taking a medication(s) for the purpose of eliminating tolerance to the drug.  Benefits Improved effectiveness of opioids. Decreased opioid dose needed to achieve benefits. Improved pain with lesser dose.  What is tolerance? Tolerance: is the progressive decreased in effectiveness of a drug due to its repetitive use. With repetitive use, the body gets use to the medication and as a consequence, it loses its effectiveness. This is a common problem seen with opioid pain medications. As a result, a larger dose of the drug is needed to achieve the same effect that used to be obtained with a  smaller dose.  How long should a "Drug Holiday" last? You should stay off of the pain medicine for at least 14 consecutive days. (2 weeks)  Should I stop the medicine "cold [place]"? No. You should always coordinate with your Pain Specialist so that he/she can provide you with the correct medication dose to make the transition as smoothly as possible.  How do I stop the medicine? Slowly. You will be instructed to decrease the daily amount of pills that you take by one (1) pill every seven (7) days. This is called a "slow downward taper" of your dose. For example: if you normally take four (4) pills per day, you will be asked to drop this dose to three (3) pills per day for seven (7) days, then to two (2) pills per day for seven (7) days, then to one (1) per day for seven (7) days, and at the end of those last seven (7) days, this is when the "Drug Holiday" would start.   Will I have withdrawals? By doing a "slow downward taper" like this one, it is unlikely that you will experience any significant withdrawal symptoms. Typically, what triggers withdrawals is the sudden stop of a high dose opioid therapy. Withdrawals can usually be avoided by slowly decreasing the dose over a prolonged period of time. If you do not follow these instructions and decide to stop your medication abruptly, withdrawals may be possible.  What are withdrawals? Withdrawals: refers to the wide range of symptoms that occur after stopping or dramatically reducing opiate drugs after heavy and prolonged use. Withdrawal symptoms do not occur to patients that use low dose opioids, or those who take the medication sporadically. Contrary to benzodiazepine (example: Valium, Xanax, etc.) or alcohol withdrawals ("Delirium Tremens"), opioid withdrawals are  not lethal. Withdrawals are the physical manifestation of the body getting rid of the excess receptors.  Expected Symptoms Early symptoms of withdrawal may  include: Agitation Anxiety Muscle aches Increased tearing Insomnia Runny nose Sweating Yawning  Late symptoms of withdrawal may include: Abdominal cramping Diarrhea Dilated pupils Goose bumps Nausea Vomiting  Will I experience withdrawals? Due to the slow nature of the taper, it is very unlikely that you will experience any.  What is a slow taper? Taper: refers to the gradual decrease in dose.  (Last update: 08/26/2019) ____________________________________________________________________________________________   ____________________________________________________________________________________________  CBD (cannabidiol) & Delta-8 (Delta-8 tetrahydrocannabinol) WARNING  Intro: Cannabidiol (CBD) and tetrahydrocannabinol (THC), are two natural compounds found in plants of the Cannabis genus. They can both be extracted from hemp or cannabis. Hemp and cannabis come from the Cannabis sativa plant. Both compounds interact with your body's endocannabinoid system, but they have very different effects. CBD does not produce the high sensation associated with cannabis. Delta-8 tetrahydrocannabinol, also known as delta-8 THC, is a psychoactive substance found in the Cannabis sativa plant, of which marijuana and hemp are two varieties. THC is responsible for the high associated with the illicit use of marijuana.  Applicable to: All individuals currently taking or considering taking CBD (cannabidiol) and, more important, all patients taking opioid analgesic controlled substances (pain medication). (Example: oxycodone; oxymorphone; hydrocodone; hydromorphone; morphine; methadone; tramadol; tapentadol; fentanyl; buprenorphine; butorphanol; dextromethorphan; meperidine; codeine; etc.)  Legal status: CBD remains a Schedule I drug prohibited for any use. CBD is illegal with one exception. In the [place], CBD has a limited Transport planner (FDA) approval for the treatment of two  specific types of epilepsy disorders. Only one CBD product has been approved by the FDA for this purpose: "Epidiolex". FDA is aware that some companies are marketing products containing cannabis and cannabis-derived compounds in ways that violate the [employer], Drug and Cosmetic Act [facility] Act) and that may put the health and safety of consumers at risk. The FDA, a Federal agency, has not enforced the CBD status since 2018. UPDATE: (03/24/2021) The Drug Enforcement Agency ([place]) issued a letter stating that "delta" cannabinoids, including Delta-8-THCO and Delta-9-THCO, synthetically derived from hemp do not qualify as hemp and will be viewed as Schedule I drugs. (Schedule I drugs, substances, or chemicals are defined as drugs with no currently accepted medical use and a high potential for abuse. Some examples of Schedule I drugs are: heroin, lysergic acid diethylamide (LSD), marijuana (cannabis), 3,4-methylenedioxymethamphetamine (ecstasy), methaqualone, and peyote.) (https://jennings.com/)  Legality: Some manufacturers ship CBD products nationally, which is illegal. Often such products are sold online and are therefore available throughout the country. CBD is openly sold in head shops and health food stores in some states where such sales have not been explicitly legalized. Selling unapproved products with unsubstantiated therapeutic claims is not only a violation of the law, but also can put patients at risk, as these products have not been proven to be safe or effective. Federal illegality makes it difficult to conduct research on CBD.  Reference: "FDA Regulation of Cannabis and Cannabis-Derived Products, Including Cannabidiol (CBD)" - [employer]Artists.[employer].pt  Warning: CBD is not FDA approved and has not undergo the same manufacturing controls as prescription drugs.  This means that the purity and  safety of available CBD may be questionable. Most of the time, despite manufacturer's claims, it is contaminated with THC (delta-9-tetrahydrocannabinol - the chemical in marijuana responsible for the "HIGH").  When this is the case, the [facility] contaminant will trigger  a positive urine drug screen (UDS) test for Marijuana (carboxy-THC). Because a positive UDS for any illicit substance is a violation of our medication agreement, your opioid analgesics (pain medicine) may be permanently discontinued. The FDA recently put out a warning about 5 things that everyone should be aware of regarding Delta-8 THC: Delta-8 THC products have not been evaluated or approved by the FDA for safe use and may be marketed in ways that put the public health at risk. The FDA has received adverse event reports involving delta-8 THC-containing products. Delta-8 THC has psychoactive and intoxicating effects. Delta-8 THC manufacturing often involve use of potentially harmful chemicals to create the concentrations of delta-8 THC claimed in the marketplace. The final delta-8 THC product may have potentially harmful by-products (contaminants) due to the chemicals used in the process. Manufacturing of delta-8 THC products may occur in uncontrolled or unsanitary settings, which may lead to the presence of unsafe contaminants or other potentially harmful substances. Delta-8 THC products should be kept out of the reach of children and pets.  MORE ABOUT CBD  General Information: CBD was discovered in [birth year] and it is a derivative of the cannabis sativa genus plants (Marijuana and Hemp). It is one of the 113 identified substances found in Marijuana. It accounts for up to 40% of the plant's extract. As of 2018, preliminary clinical studies on CBD included research for the treatment of anxiety, movement disorders, and pain. CBD is available and consumed in multiple forms, including inhalation of smoke or vapor, as an aerosol spray, and by mouth. It  may be supplied as an oil containing CBD, capsules, dried cannabis, or as a liquid solution. CBD is thought not to be as psychoactive as THC (delta-9-tetrahydrocannabinol - the chemical in marijuana responsible for the "HIGH"). Studies suggest that CBD may interact with different biological target receptors in the body, including cannabinoid and other neurotransmitter receptors. As of 2018 the mechanism of action for its biological effects has not been determined.  Side-effects  Adverse reactions: Dry mouth, diarrhea, decreased appetite, fatigue, drowsiness, malaise, weakness, sleep disturbances, and others.  Drug interactions: CBC may interact with other medications such as blood-thinners. Because CBD causes drowsiness on its own, it also increases the drowsiness caused by other medications, including antihistamines (such as Benadryl), benzodiazepines (Xanax, Ativan, Valium), antipsychotics, antidepressants and opioids, as well as alcohol and supplements such as kava, melatonin and St. John's Wort. Be cautious with the following combinations:   Brivaracetam (Briviact) Brivaracetam is changed and broken down by the body. CBD might decrease how quickly the body breaks down brivaracetam. This might increase levels of brivaracetam in the body.  Caffeine Caffeine is changed and broken down by the body. CBD might decrease how quickly the body breaks down caffeine. This might increase levels of caffeine in the body.  Carbamazepine (Tegretol) Carbamazepine is changed and broken down by the body. CBD might decrease how quickly the body breaks down carbamazepine. This might increase levels of carbamazepine in the body and increase its side effects.  Citalopram (Celexa) Citalopram is changed and broken down by the body. CBD might decrease how quickly the body breaks down citalopram. This might increase levels of citalopram in the body and increase its side effects.  Clobazam (Onfi) Clobazam is changed and  broken down by the liver. CBD might decrease how quickly the liver breaks down clobazam. This might increase the effects and side effects of clobazam.  Eslicarbazepine (Aptiom) Eslicarbazepine is changed and broken down by the body. CBD might decrease  how quickly the body breaks down eslicarbazepine. This might increase levels of eslicarbazepine in the body by a small amount.  Everolimus (Zostress) Everolimus is changed and broken down by the body. CBD might decrease how quickly the body breaks down everolimus. This might increase levels of everolimus in the body.  Lithium Taking higher doses of CBD might increase levels of lithium. This can increase the risk of lithium toxicity.  Medications changed by the liver (Cytochrome P450 1A1 (CYP1A1) substrates) Some medications are changed and broken down by the liver. CBD might change how quickly the liver breaks down these medications. This could change the effects and side effects of these medications.  Medications changed by the liver (Cytochrome P450 1A2 (CYP1A2) substrates) Some medications are changed and broken down by the liver. CBD might change how quickly the liver breaks down these medications. This could change the effects and side effects of these medications.  Medications changed by the liver (Cytochrome P450 1B1 (CYP1B1) substrates) Some medications are changed and broken down by the liver. CBD might change how quickly the liver breaks down these medications. This could change the effects and side effects of these medications.  Medications changed by the liver (Cytochrome P450 2A6 (CYP2A6) substrates) Some medications are changed and broken down by the liver. CBD might change how quickly the liver breaks down these medications. This could change the effects and side effects of these medications.  Medications changed by the liver (Cytochrome P450 2B6 (CYP2B6) substrates) Some medications are changed and broken down by the liver. CBD  might change how quickly the liver breaks down these medications. This could change the effects and side effects of these medications.  Medications changed by the liver (Cytochrome P450 2C19 (CYP2C19) substrates) Some medications are changed and broken down by the liver. CBD might change how quickly the liver breaks down these medications. This could change the effects and side effects of these medications.  Medications changed by the liver (Cytochrome P450 2C8 (CYP2C8) substrates) Some medications are changed and broken down by the liver. CBD might change how quickly the liver breaks down these medications. This could change the effects and side effects of these medications.  Medications changed by the liver (Cytochrome P450 2C9 (CYP2C9) substrates) Some medications are changed and broken down by the liver. CBD might change how quickly the liver breaks down these medications. This could change the effects and side effects of these medications.  Medications changed by the liver (Cytochrome P450 2D6 (CYP2D6) substrates) Some medications are changed and broken down by the liver. CBD might change how quickly the liver breaks down these medications. This could change the effects and side effects of these medications.  Medications changed by the liver (Cytochrome P450 2E1 (CYP2E1) substrates) Some medications are changed and broken down by the liver. CBD might change how quickly the liver breaks down these medications. This could change the effects and side effects of these medications.  Medications changed by the liver (Cytochrome P450 3A4 (CYP3A4) substrates) Some medications are changed and broken down by the liver. CBD might change how quickly the liver breaks down these medications. This could change the effects and side effects of these medications.  Medications changed by the liver (Glucuronidated drugs) Some medications are changed and broken down by the liver. CBD might change how quickly  the liver breaks down these medications. This could change the effects and side effects of these medications.  Medications that decrease the breakdown of other medications by the liver (Cytochrome P450  2C19 (CYP2C19) inhibitors) CBD is changed and broken down by the liver. Some drugs decrease how quickly the liver changes and breaks down CBD. This could change the effects and side effects of CBD.  Medications that decrease the breakdown of other medications in the liver (Cytochrome P450 3A4 (CYP3A4) inhibitors) CBD is changed and broken down by the liver. Some drugs decrease how quickly the liver changes and breaks down CBD. This could change the effects and side effects of CBD.  Medications that increase breakdown of other medications by the liver (Cytochrome P450 3A4 (CYP3A4) inducers) CBD is changed and broken down by the liver. Some drugs increase how quickly the liver changes and breaks down CBD. This could change the effects and side effects of CBD.  Medications that increase the breakdown of other medications by the liver (Cytochrome P450 2C19 (CYP2C19) inducers) CBD is changed and broken down by the liver. Some drugs increase how quickly the liver changes and breaks down CBD. This could change the effects and side effects of CBD.  Methadone (Dolophine) Methadone is broken down by the liver. CBD might decrease how quickly the liver breaks down methadone. Taking cannabidiol along with methadone might increase the effects and side effects of methadone.  Rufinamide (Banzel) Rufinamide is changed and broken down by the body. CBD might decrease how quickly the body breaks down rufinamide. This might increase levels of rufinamide in the body by a small amount.  Sedative medications (CNS depressants) CBD might cause sleepiness and slowed breathing. Some medications, called sedatives, can also cause sleepiness and slowed breathing. Taking CBD with sedative medications might cause breathing  problems and/or too much sleepiness.  Sirolimus (Rapamune) Sirolimus is changed and broken down by the body. CBD might decrease how quickly the body breaks down sirolimus. This might increase levels of sirolimus in the body.  Stiripentol (Diacomit) Stiripentol is changed and broken down by the body. CBD might decrease how quickly the body breaks down stiripentol. This might increase levels of stiripentol in the body and increase its side effects.  Tacrolimus (Prograf) Tacrolimus is changed and broken down by the body. CBD might decrease how quickly the body breaks down tacrolimus. This might increase levels of tacrolimus in the body.  Tamoxifen (Soltamox) Tamoxifen is changed and broken down by the body. CBD might affect how quickly the body breaks down tamoxifen. This might affect levels of tamoxifen in the body.  Topiramate (Topamax) Topiramate is changed and broken down by the body. CBD might decrease how quickly the body breaks down topiramate. This might increase levels of topiramate in the body by a small amount.  Valproate Valproic acid can cause liver injury. Taking cannabidiol with valproic acid might increase the chance of liver injury. CBD and/or valproic acid might need to be stopped, or the dose might need to be reduced.  Warfarin (Coumadin) CBD might increase levels of warfarin, which can increase the risk for bleeding. CBD and/or warfarin might need to be stopped, or the dose might need to be reduced.  Zonisamide Zonisamide is changed and broken down by the body. CBD might decrease how quickly the body breaks down zonisamide. This might increase levels of zonisamide in the body by a small amount. (Last update: 04/05/2021) ____________________________________________________________________________________________  Naloxone Nasal Spray What is this medication? NALOXONE (nal OX one) treats opioid overdose, which causes slow or shallow breathing, severe drowsiness, or trouble  staying awake. Call emergency services after using this medication. You may need additional treatment. Naloxone works by reversing the effects  of opioids. It belongs to a group of medications called opioid blockers. This medicine may be used for other purposes; ask your health care provider or pharmacist if you have questions. COMMON BRAND NAME(S): Kloxxado, Narcan What should I tell my care team before I take this medication? They need to know if you have any of these conditions: Heart disease Substance use disorder An unusual or allergic reaction to naloxone, other medications, foods, dyes, or preservatives Pregnant or trying to get pregnant Breast-feeding How should I use this medication? This medication is for use in the nose. Lay the person on their back. Support their neck with your hand and allow the head to tilt back before giving the medication. The nasal spray should be given into 1 nostril. After giving the medication, move the person onto their side. Do not remove or test the nasal spray until ready to use. Get emergency medical help right away after giving the first dose of this medication, even if the person wakes up. You should be familiar with how to recognize the signs and symptoms of a narcotic overdose. If more doses are needed, give the additional dose in the other nostril. Talk to your care team about the use of this medication in children. While this medication may be prescribed for children as young as newborns for selected conditions, precautions do apply. Overdosage: If you think you have taken too much of this medicine contact a poison control center or emergency room at once. NOTE: This medicine is only for you. Do not share this medicine with others. What if I miss a dose? This does not apply. What may interact with this medication? This is only used during an emergency. No interactions are expected during emergency use. This list may not describe all possible  interactions. Give your health care provider a list of all the medicines, herbs, non-prescription drugs, or dietary supplements you use. Also tell them if you smoke, drink alcohol, or use illegal drugs. Some items may interact with your medicine. What should I watch for while using this medication? Keep this medication ready for use in the case of an opioid overdose. Make sure that you have the phone number of your care team and local hospital ready. You may need to have additional doses of this medication. Each nasal spray contains a single dose. Some emergencies may require additional doses. After use, bring the treated person to the nearest hospital or call 911. Make sure the treating care team knows that the person has received a dose of this medication. You will receive additional instructions on what to do during and after use of this medication before an emergency occurs. What side effects may I notice from receiving this medication? Side effects that you should report to your care team as soon as possible: Allergic reactions--skin rash, itching, hives, swelling of the face, lips, tongue, or throat Side effects that usually do not require medical attention (report these to your care team if they continue or are bothersome): Constipation Dryness or irritation inside the nose Headache Increase in blood pressure Muscle spasms Stuffy nose Toothache This list may not describe all possible side effects. Call your doctor for medical advice about side effects. You may report side effects to FDA at 1-800-FDA-1088. Where should I keep my medication? Keep out of the reach of children and pets. Store between 20 and 25 degrees C (68 and 77 degrees F). Do not freeze. Throw away any unused medication after the expiration date. Keep in original  box until ready to use. NOTE: This sheet is a summary. It may not cover all possible information. If you have questions about this medicine, talk to your doctor,  pharmacist, or health care provider.  2023 Elsevier/Gold Standard (2020-09-30 00:00:00)

## 2021-12-04 NOTE — Progress Notes (Signed)
Nursing Pain Medication Assessment:  Safety precautions to be maintained throughout the outpatient stay will include: orient to surroundings, keep bed in low position, maintain call bell within reach at all times, provide assistance with transfer out of bed and ambulation.  Medication Inspection Compliance: Pill count conducted under aseptic conditions, in front of the patient. Neither the pills nor the bottle was removed from the patient's sight at any time. Once count was completed pills were immediately returned to the patient in their original bottle.  Medication #1: Fentanyl patch Pill/Patch Count:  0 of 15 patches remain Pill/Patch Appearance: Markings consistent with prescribed medication Bottle Appearance: Standard pharmacy container. Clearly labeled. Filled Date: 09 / 05 / 2023 Last Medication intake:  Day before yesterday  Medication #2: Fentanyl patch Pill/Patch Count:  0 of 15 pills remain Pill/Patch Appearance: Markings consistent with prescribed medication Bottle Appearance: Standard pharmacy container. Clearly labeled. Filled Date: 09 / 08 / 2023 Last Medication intake:  Day before yesterday  Medication #3: Oxycodone IR Pill/Patch Count:  150 of 150 pills remain Pill/Patch Appearance: Markings consistent with prescribed medication Bottle Appearance: Standard pharmacy container. Clearly labeled. Filled Date: 09 / 12 / 2023 Last Medication intake:  Yesterday

## 2021-12-04 NOTE — Progress Notes (Addendum)
PROVIDER NOTE: Information contained herein reflects review and annotations entered in association with encounter. Interpretation of such information and data should be left to medically-trained personnel. Information provided to patient can be located elsewhere in the medical record under "Patient Instructions". Document created using STT-dictation technology, any transcriptional errors that may result from process are unintentional.    Patient: Maria Keller  Service Category: E/M  Provider: Gaspar Cola, MD  DOB: 12-13-1950  DOS: 12/04/2021  Referring Provider: Einar Pheasant, MD  MRN: [id number]  Specialty: Interventional Pain Management  PCP: Einar Pheasant, MD  Type: Established Patient  Setting: Ambulatory outpatient    Location: Office  Delivery: Face-to-face     Primary Reason(s) for Visit: Encounter for evaluation before starting new chronic pain management plan of care (Level of risk: moderate) CC: Back Pain (Lumbar bilateral ) and Hip Pain (Right )  HPI  Maria Keller is a 71 y.o. year old, female patient, who comes today for a follow-up evaluation to review the test results and decide on a treatment plan. She has History of lymphoma; Pure hypercholesterolemia; Nephrolithiasis; Gastro-esophageal reflux disease without esophagitis; Fibromyalgia; Rash; Skin nodule; SOB (shortness of breath); Pain in joint involving multiple sites; Non-Hodgkin's lymphoma ([employer]); Chronic low back pain (1ry area of Pain) (Bilateral) (R>L) w/o sciatica; Chronic sacroiliac joint pain (Bilateral); Hypercholesterolemia; Health care maintenance; DDD (degenerative disc disease), lumbar; Lumbar facet syndrome; Sacroiliac joint dysfunction; Postherpetic neuralgia; Degenerative disc disease, lumbar; Lumbar radiculopathy; Night sweats; Acute bronchitis; Cough; Overweight (BMI 25.0-29.9); Anemia; Acute upper respiratory infection; Hair loss; Headache; 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[facility]); Fatigue; Skin lesion;  Hyperglycemia; Folliculitis; E95 deficiency; Ear lesion; Tingling sensation; External nasal lesion; Knee swelling; Breast cancer screening; Estrogen deficiency; Dysuria; Chipped tooth; Loose total knee arthroplasty, sequela (Right); 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, right; Diverticulosis; History of shingles; History of urinary stone; Intractable migraine with aura without status migrainosus; Myalgia and myositis; Osteoarthritis; Primary osteoarthritis of right knee; History of total knee replacement (Right); Sleep apnea; Lymphoma ([place]); Psoriasis (a type of skin inflammation); Chronic pain syndrome; Pharmacologic therapy; Disorder of skeletal system; Problems influencing health status; Chronic use of opiate for therapeutic purpose; Opioid use (487.5 MME/day); Drug tolerance, sequela; Physical tolerance to opiate drug; Chronic thigh pain (2ry area of Pain) (Bilateral) (R>L); Chronic knee pain after total replacement (3ry area of Pain) (Right); Chronic shoulder pain (4th area of Pain) (Bilateral) (R>L); Lymphedema; Chronic venous insufficiency; Status post revision of total knee replacement, right; UTI (urinary tract infection); Osteoarthritis of acromioclavicular joints (Bilateral); Tendinopathy of rotator cuff (Bilateral); Shoulder enthesopathy (Bilateral); Grade 1 Anterolisthesis of lumbosacral spine of L5/S1; DDD (degenerative disc disease), lumbosacral; Lumbosacral facet arthropathy; and Osteoarthritis of sacroiliac joints (Bilateral) (HCC) on their problem list. Her primarily concern today is the Back Pain (Lumbar bilateral ) and Hip Pain (Right )  Pain Assessment: Location: Lower, Left, Right Back (right hip) Radiating: ? if pain in right hip is coming from back. Onset: More than a month ago Duration: Chronic pain Quality: Discomfort, Constant Severity: 3 /10 (subjective, self-reported pain score)  Effect on ADL: reports that she doesn't do a whole lot Timing: Constant Modifying factors:  medications BP: 112/67  HR: 81  Maria Keller comes in today for a follow-up visit after her initial evaluation on 10/12/2021. Today we went over the results of her tests. These were explained in "Layman's terms". During today's appointment we went over my diagnostic impression, as well as the proposed treatment plan.  Review of initial evaluation (10/12/2021): "  According to the patient the primary area of pain is that of the lower back (Bilateral) (R>L).  The patient indicates that the pain goes all the way down to her coccyx.  She denies any prior surgeries, any recent x-rays or imaging studies, she indicates having had multiple nerve blocks in her back by Dr. Mohammed Kindle, but none of them recent.  She also indicates having [facility].  The patient's secondary area pain is that of her thighs (Bilateral) (R>L).  She denies any prior surgeries in that area, any recent x-rays, no nerve blocks, but she does indicate that approximately 6 months ago Dr. Roland Rack ([place]) did a right leg injection, but she is not sure what kind of injection it was.  The patient's third area pain is that of the right knee.  She indicates having had the right knee replaced more than 5 years ago by Dr. Vilinda Flake.  Because of the pain that she was experiencing, she had this evaluated by Dr. Roland Rack who diagnosed her as having some hardware loosening and requiring a revision of the total knee replacement.  She is scheduled to undergo the surgery on November 06, 2021.  When asked, the patient indicated that Dr. Roland Rack will manage her postoperative pain.  I reminded her that we do not manage any acute postop pain.  The patient's fourth area of pain is that of the shoulders (Bilateral) (R>L).  She denies any prior surgeries, joint injections, any recent imaging studies, or physical therapy.  Pharmacotherapy: (Prescribed by Dr. Mohammed Kindle) Oxycodone IR 5 mg, 1 tab  p.o. up to 5 times per day (PRN for breakthrough pain). Duragesic patch 25 mcg/h every 48 hours Duragesic patch 100 mcg/h every 48 hours  Pharmacies[facility] [place], [place], [place] [phone].  As of today, they still have 1 box of fentanyl 100 mcg patches, 1 box of fentanyl 25 mcg patches, and a prescription for oxycodone IR 5 mg (150). [facility] in [address] [place], [place] [phone].  As of today, they have 1 box of fentanyl 25 mcg patches.  Today I have explained to the patient that I do not take patients for medication management only.  However, since she indicated that she is interested in coming off of her opioids, I have agreed to evaluate her case.  The patient was informed that once we take over her medications and begin the tapering process to get her off of the medications, there will not be any type of negotiations to slow down or stop that process.  I have also explained to the patient that using the smallest available unit of opioid (Oxy IR 5 mg tablet) (7.5 MME) we will slowly decrease her opioid consumption by those 7.5 MME's per week.  Since he is currently taking an estimated 487.5 MME/day, he will take [place] approximately 16 months to be completely off of the opioids.  Today I have warned the patient about avoiding drops in dosing larger than the amounts that we will be instructing her to decrease.  She understood and accepted.  Since she is pending to have a revision of her total knee replacement on November 06, 2021, he will be difficult to accurately decrease in control her opioid use knowing that she will require some pain medication around the time of her surgery.  She indicates that after her initial replacement, it took her approximately 3 weeks to be  able to recover and begin her physical therapy at [facility] physical therapy.  Assuming that this time she has a similar recovery period, then it is likely that we will begin tapering her opioids  starting at the end of October.  In giving her some advice of what she can do until then, it turns out that she refers not taking the oxycodone IR 5 mg 5 times a day.  She refers that occasionally she will only take 1/day.  I asked her if she would be able to continue taking only 1 pill/day and if so, to try to do that until I see her back after her knee surgery.  We also took the time to call her pharmacies and we learned that she has prescriptions left for oxycodone, Duragesic 25 and 100 mcg/h patches.  I have recommended that she try changing her patches every 72 hours instead of every 48 hours as she has been doing.  In any case, the Duragesic patches are technically supposed to be changed every 3 days and not every 2 days.  If we can have her do those 2 changes, her prescriptions should last until the very end of October.  I do realize that she will probably get additional pain medicine for the postop.  From the orthopedic surgeon, and this would be okay as long as she keeps her chronic pain regiment as instructed.  The patient indicated that she thinks she can do this without problems.  I have also informed the patient that because of the pain ordered an amount of pain medication that she is taking, I will probably need to see her on a monthly basis until we can have her completely off of her opioids.  Again, she understood and accepted."  Review of current pharmacotherapy: According to the patient's PMP on 11/22/2021 she received a prescription for hydrocodone/APAP 5/325, 1 tab p.o. 4 times daily (# 30) (filled on 11/22/2021).  In addition, the patient also received oxycodone IR 5 mg tablet, 1 tab p.o. 5 times a day (150) (last filled on 10/17/2021); fentanyl 100 mcg/h patch, every 48 hours (# 15) (filled on 10/13/2021); fentanyl 25 mcg/h patch every 48 hours (# 15) (last filled 10/13/2021).  Today we went over the results of her x-rays and lab work.  Both were explained to the patient in layman's  terms.  Today the patient's husband was present and he was able to hear the instructions and the plan to reduce the amount of opioids that she is currently taking.  She follows my recommendation since she is no longer taking the fentanyl patches every 48 hours, instead she has gone to every 72 hours as instructed.  The following instructions were given to the patient on her AVS to begin day and downward tapering of her opioids.  I have decided to start working with the fentanyl patches since the smallest available unit is the 12 5 mcg/h patch which has an MME/day of 30.  Oxycodone IR 5 mg on the other hand has MME/day of 7.5.  For this reason I will be tapering down the fentanyl first and the oxycodone IR last.  Downward tapering instructions: Keep the current 25 mcg patch for a total of 6 days. When the time comes to replace the 25 mcg patch, replace it with a 12.5 mcg/h patch x1. Keep the 12.5 mcg/hr patch on x 6 days, then remove it and do not replace it with another. Continue using the 100 mcg/h patch as instructed, changing  the patch every 3 days (72 hours). Continue taking the oxycodone IR 5 mg, 1 tablet by mouth, no more than 4 times a day.  If you feel that you do not need to take the oxycodone, you may skip a dose.  RTCB: 01/03/2022.  By the time she returns, she should be on the fentanyl 100 mcg/h patch + oxycodone IR 5 mg 4 times daily.    Downward taper plan:  The neck step then will be to bring down the fentanyl to 87.5 by using: Phase 1: (Approx. 7 weeks) Step 1: 75 mcg + 12.5 mcg patch x 1 wk. Step 2: 75 mcg x 1 week. Step 3: 50 mcg +12.5 mcg x 1 week. Step 4: 50 mcg x 1 week. Step 5: 25 mcg +12.5 mcg x 1 week. Step 6: 25 mcg x 1 week. Step 7: 12.5 mcg x 1 week. (All of the above while maintaining the oxycodone IR 5 mg tablet, 1 tab p.o. 4 times daily)  Phase 2:  Step 1: Oxycodone 5 mg 4/day x1 week Step 2: Oxycodone 5 mg 3/day x 1 week Step 3: Oxycodone 5 mg 2/day x 1  week Step 4: Oxycodone 5 mg 1/day x 1 week Step 5:  "Drug Holiday" x2-3 weeks  Controlled Substance Pharmacotherapy Assessment REMS (Risk Evaluation and Mitigation Strategy)  Opioid Analgesic: Oxycodone IR 5 mg tablet, 1 tab p.o. 5 times daily (#150) (last filled on 09/20/2021) (37.5 MME/day); fentanyl 25 mcg/h patch every 48 hours (#15) (last filled on 09/15/2021) (90 MME/day) + fentanyl 100 mcg/h patch every 48 hours (# 15) (last filled on 09/12/2021) (360 MME/day). MME/day: 487.5 mg/day  Pill Count: None expected due to no prior prescriptions written by our practice. Janett Billow, RN  12/04/2021 10:51 AM  Signed [facility], [facility] do no [facility] any active controlled Rx's Called to [facility], they did have 1 Rx for fentanyl 25 mcg/h.  That is now cancelled.   Janett Billow, RN  12/04/2021 11:44 AM  Signed Nursing Pain Medication Assessment:  Safety precautions to be maintained throughout the outpatient stay will include: orient to surroundings, keep bed in low position, maintain call bell within reach at all times, provide assistance with transfer out of bed and ambulation.  Medication Inspection Compliance: Pill count conducted under aseptic conditions, in front of the patient. Neither the pills nor the bottle was removed from the patient's sight at any time. Once count was completed pills were immediately returned to the patient in their original bottle.  Medication #1: Fentanyl patch Pill/Patch Count:  0 of 15 patches remain Pill/Patch Appearance: Markings consistent with prescribed medication Bottle Appearance: Standard pharmacy container. Clearly labeled. Filled Date: 09 / 05 / 2023 Last Medication intake:  Day before yesterday  Medication #2: Fentanyl patch Pill/Patch Count:  0 of 15 pills remain Pill/Patch Appearance: Markings consistent with prescribed medication Bottle Appearance: Standard pharmacy container. Clearly labeled. Filled Date: 09 / 08 /  2023 Last Medication intake:  Day before yesterday  Medication #3: Oxycodone IR Pill/Patch Count:  150 of 150 pills remain Pill/Patch Appearance: Markings consistent with prescribed medication Bottle Appearance: Standard pharmacy container. Clearly labeled. Filled Date: 09 / 12 / 2023 Last Medication intake:  Yesterday   Pharmacokinetics: Liberation and absorption (onset of action): WNL Distribution (time to peak effect): WNL Metabolism and excretion (duration of action): WNL         Pharmacodynamics: Desired effects: Analgesia: Ms. Korell reports >50% benefit. Functional ability: Patient  reports that medication allows her to accomplish basic ADLs Clinically meaningful improvement in function (CMIF): Sustained CMIF goals met Perceived effectiveness: Described as relatively effective, allowing for increase in activities of daily living (ADL) Undesirable effects: Side-effects or Adverse reactions: None reported Monitoring:  PMP: PDMP reviewed during this encounter. Online review of the past 39-monthperiod previously conducted. Not applicable at this point since we have not taken over the patient's medication management yet. List of other Serum/Urine Drug Screening Test(s):  No results found for: "AMPHSCRSER", "BARBSCRSER", "BENZOSCRSER", "COCAINSCRSER", "COCAINSCRNUR", "PCPSCRSER", "THCSCRSER", "THCU", "CANNABQUANT", "OPIATESCRSER", "OXYSCRSER", "PROPOXSCRSER", "ETH", "CBDTHCR", "D8THCCBX", "D9THCCBX" List of all UDS test(s) done:  Lab Results  Component Value Date   TOXASSSELUR FINAL 07/12/2015   SUMMARY Note 10/12/2021   Last UDS on record: ToxAssure Select 13  Date Value Ref Range Status  07/12/2015 FINAL  Final    Comment:    ==================================================================== TOXASSURE SELECT 13 (MW) ==================================================================== Test                             Result       Flag       Units Drug Present and Declared  for Prescription Verification   Fentanyl                       >284         EXPECTED   ng/mg creat   Norfentanyl                    >568         EXPECTED   ng/mg creat    Source of fentanyl is a scheduled prescription medication,    including IV, patch, and transmucosal formulations. Norfentanyl    is an expected metabolite of fentanyl. Drug Absent but Declared for Prescription Verification   Oxycodone                      Not Detected UNEXPECTED ng/mg creat ==================================================================== Test                      Result    Flag   Units      Ref Range   Creatinine              176              mg/dL      >=20 ==================================================================== Declared Medications:  The flagging and interpretation on this report are based on the  following declared medications.  Unexpected results may arise from  inaccuracies in the declared medications.  **Note: The testing scope of this panel includes these medications:  Fentanyl (Duragesic)  Oxycodone  **Note: The testing scope of this panel does not include following  reported medications:  Aspirin  Azelastine  Calcium Carbonate (Tums)  Cetirizine (Zyrtec)  Citalopram (Lexapro)  Cyanocobalamin  Docusate (Senokot-S)  Doxycycline  Estrogen (Premarin)  Fluticasone (Flonase)  Furosemide (Lasix)  Multivitamin (Integra)  Omega-3 Fatty Acids (Fish Oil)  Omeprazole (Prilosec)  Sennosides (Senokot-S)  Valacyclovir (Valtrex) ==================================================================== For clinical consultation, please call [phone] ====================================================================    Summary  Date Value Ref Range Status  10/12/2021 Note  Final    Comment:    ==================================================================== Compliance Drug Analysis, Ur ==================================================================== Test  Result       Flag       Units  Drug Present and Declared for Prescription Verification   Oxycodone                      79           EXPECTED   ng/mg creat   Oxymorphone                    47           EXPECTED   ng/mg creat   Noroxycodone                   611          EXPECTED   ng/mg creat    Sources of oxycodone include scheduled prescription medications.    Oxymorphone and noroxycodone are expected metabolites of oxycodone.    Oxymorphone is also available as a scheduled prescription medication.    Fentanyl                       >231         EXPECTED   ng/mg creat   Norfentanyl                    >463         EXPECTED   ng/mg creat    Source of fentanyl is a scheduled prescription medication, including    IV, patch, and transmucosal formulations. Norfentanyl is an expected    metabolite of fentanyl.  Drug Present not Declared for Prescription Verification   Acetaminophen                  PRESENT      UNEXPECTED  Drug Absent but Declared for Prescription Verification   Salicylate                     Not Detected UNEXPECTED    Aspirin, as indicated in the declared medication list, is not always    detected even when used as directed.    Lidocaine                      Not Detected UNEXPECTED    Lidocaine, as indicated in the declared medication list, is not    always detected even when used as directed.  ==================================================================== Test                      Result    Flag   Units      Ref Range   Creatinine              216              mg/dL      >=20 ==================================================================== Declared Medications:  The flagging and interpretation on this report are based on the  following declared medications.  Unexpected results may arise from  inaccuracies in the declared medications.   **Note: The testing scope of this panel includes these medications:   Fentanyl (Duragesic)  Oxycodone  (OxyIR)   **Note: The testing scope of this panel does not include small to  moderate amounts of these reported medications:   Aspirin  Topical Lidocaine (Lidoderm)   **Note: The testing scope of this panel does not include the  following reported medications:   Atorvastatin (Lipitor)  Azelastine (Astelin)  Calcium (  Tums)  Cetirizine (Zyrtec)  Docusate (Senokot-S)  Estradiol (Estrace)  Fluconazole (Diflucan)  Fluticasone (Flonase)  Furosemide (Lasix)  Mouthwash  Mupirocin (Bactroban)  Nitrofurantoin (Macrobid)  Omeprazole (Prilosec)  Sennosides (Senokot-S) ==================================================================== For clinical consultation, please call [phone]. ====================================================================    UDS interpretation: No unexpected findings.          Medication Assessment Form: Not applicable. No opioids. Treatment compliance: Not applicable Risk Assessment Profile: Aberrant behavior: See initial evaluations. None observed or detected today Comorbid factors increasing risk of overdose: See initial evaluation. No additional risks detected today Opioid risk tool (ORT):     12/04/2021   10:46 AM  Opioid Risk   Alcohol 0  Illegal Drugs 0  Rx Drugs 0  Alcohol 0  Illegal Drugs 0  Rx Drugs 0  Age between 16-45 years  0  Psychological Disease 0  Depression 0  Opioid Risk Tool Scoring 0  Opioid Risk Interpretation Low Risk    ORT Scoring interpretation table:  Score <3 = Low Risk for SUD  Score between 4-7 = Moderate Risk for SUD  Score >8 = High Risk for Opioid Abuse   Risk of substance use disorder (SUD): Low  Risk Mitigation Strategies:  Patient opioid safety counseling: No controlled substances prescribed. Patient-Prescriber Agreement (PPA): No agreement signed.  Controlled substance notification to other providers: None required. No opioid therapy.  Pharmacologic Plan: Non-opioid analgesic therapy  offered. Interventional alternatives discussed.             Laboratory Chemistry Profile   Renal Lab Results  Component Value Date   BUN 11 11/08/2021   CREATININE 0.68 11/08/2021   BCR 13 10/12/2021   GFR 72.39 08/01/2021   GFRAA >60 11/06/2012   GFRNONAA >60 11/08/2021   SPECGRAV 1.015 07/21/2021   PHUR 7.0 07/21/2021   PROTEINUR 30 (A) 10/26/2021     Electrolytes Lab Results  Component Value Date   NA 138 11/08/2021   K 3.3 (L) 11/08/2021   CL 103 11/08/2021   CALCIUM 8.6 (L) 11/08/2021   MG 2.4 (H) 10/12/2021     Hepatic Lab Results  Component Value Date   AST 28 10/16/2021   ALT 21 10/16/2021   ALBUMIN 4.3 10/16/2021   ALKPHOS 62 10/16/2021     ID Lab Results  Component Value Date   STAPHAUREUS NEGATIVE 10/26/2021   MRSAPCR NEGATIVE 10/26/2021   HCVAB NEGATIVE 05/04/2015     Bone Lab Results  Component Value Date   25OHVITD1 32 10/12/2021   25OHVITD2 <1.0 10/12/2021   25OHVITD3 32 10/12/2021     Endocrine Lab Results  Component Value Date   GLUCOSE 163 (H) 11/08/2021   GLUCOSEU NEGATIVE 12/04/2021   HGBA1C 5.5 08/01/2021   TSH 1.61 02/28/2021     Neuropathy Lab Results  Component Value Date   VITAMINB12 551 10/12/2021   HGBA1C 5.5 08/01/2021     CNS No results found for: "COLORCSF", "APPEARCSF", "RBCCOUNTCSF", "WBCCSF", "POLYSCSF", "LYMPHSCSF", "EOSCSF", "PROTEINCSF", "GLUCCSF", "JCVIRUS", "CSFOLI", "IGGCSF", "LABACHR", "ACETBL"   Inflammation (CRP: Acute  ESR: Chronic) Lab Results  Component Value Date   CRP 3 10/12/2021   ESRSEDRATE 24 10/12/2021     Rheumatology No results found for: "RF", "ANA", "LABURIC", "URICUR", "LYMEIGGIGMAB", "LYMEABIGMQN", "HLAB27"   Coagulation Lab Results  Component Value Date   PLT 187 11/08/2021     Cardiovascular Lab Results  Component Value Date   CKTOTAL 55 01/07/2017   HGB 10.6 (L) 11/08/2021   HCT 32.8 (L) 11/08/2021  Screening Lab Results  Component Value Date   STAPHAUREUS  NEGATIVE 10/26/2021   MRSAPCR NEGATIVE 10/26/2021   HCVAB NEGATIVE 05/04/2015     Cancer No results found for: "CEA", "CA125", "LABCA2"   Allergens No results found for: "ALMOND", "APPLE", "ASPARAGUS", "AVOCADO", "BANANA", "BARLEY", "BASIL", "BAYLEAF", "GREENBEAN", "LIMABEAN", "WHITEBEAN", "BEEFIGE", "REDBEET", "BLUEBERRY", "BROCCOLI", "CABBAGE", "MELON", "CARROT", "CASEIN", "CASHEWNUT", "CAULIFLOWER", "CELERY"     Note: Lab results reviewed.  Recent Diagnostic Imaging Review  Shoulder Imaging: Shoulder-R DG: Results for orders placed during the hospital encounter of 10/12/21 DG Shoulder Right  Narrative CLINICAL DATA:  Bilateral shoulder pain.  No known injury.  EXAM: RIGHT SHOULDER - 2+ VIEW; LEFT SHOULDER - 2+ VIEW  COMPARISON:  None available  FINDINGS: Right shoulder: Surgical clips noted in the right medial upper arm. Irregularity of the greater tuberosity indicative of rotator cuff tendinopathy. Mild degenerative changes of the acromioclavicular joint.  Left shoulder: Irregularity of the lesser tuberosity indicative of rotator cuff tendinopathy. Mild degenerative changes of the acromioclavicular joint. No fracture or dislocation.  IMPRESSION: 1. Radiographic appearance of the shoulders suggestive of rotator cuff tendinopathy. 2. Mild bilateral acromioclavicular osteoarthrosis.   Electronically Signed By: Miachel Roux M.D. On: 10/14/2021 13:19  Shoulder-L DG: Results for orders placed during the hospital encounter of 10/12/21 DG Shoulder Left  Narrative CLINICAL DATA:  Bilateral shoulder pain.  No known injury.  EXAM: RIGHT SHOULDER - 2+ VIEW; LEFT SHOULDER - 2+ VIEW  COMPARISON:  None available  FINDINGS: Right shoulder: Surgical clips noted in the right medial upper arm. Irregularity of the greater tuberosity indicative of rotator cuff tendinopathy. Mild degenerative changes of the acromioclavicular joint.  Left shoulder: Irregularity of the  lesser tuberosity indicative of rotator cuff tendinopathy. Mild degenerative changes of the acromioclavicular joint. No fracture or dislocation.  IMPRESSION: 1. Radiographic appearance of the shoulders suggestive of rotator cuff tendinopathy. 2. Mild bilateral acromioclavicular osteoarthrosis.   Electronically Signed By: Miachel Roux M.D. On: 10/14/2021 13:19  Lumbosacral Imaging: Lumbar DG Bending views: Results for orders placed during the hospital encounter of 10/12/21 DG Lumbar Spine Complete W/Bend  Narrative CLINICAL DATA:  Chronic low back pain with no known injury  EXAM: LUMBAR SPINE - COMPLETE WITH BENDING VIEWS  COMPARISON:  11/12/2019  FINDINGS: Right upper quadrant surgical clips likely related to prior cholecystectomy. There are 5 lumbar type vertebral bodies.  Mild facet degenerative changes at L4-L5 and L5-S1.  Grade 1 anterolisthesis of L5 on S1 which is not significantly change with flexion or extension.  Vertebral body heights are maintained. No significant disc height loss.  IMPRESSION: Mild degenerative changes of the lower lumbar spine, predominately involving the facets.   Electronically Signed By: Miachel Roux M.D. On: 10/14/2021 13:15  Sacroiliac Joint Imaging: Sacroiliac Joint DG: Results for orders placed during the hospital encounter of 10/12/21 DG Si Joints  Narrative CLINICAL DATA:  Chronic low back pain  EXAM: BILATERAL SACROILIAC JOINTS - 3+ VIEW  COMPARISON:  CT abdomen pelvis 03/17/2013  FINDINGS: Minimal symmetric spurring of the sacroiliac joints. The joint space is maintained.  No fracture or dislocation.  Visualized soft tissues are normal.  IMPRESSION: Minimal bilateral sacroiliac joint osteoarthrosis.   Electronically Signed By: Miachel Roux M.D. On: 10/14/2021 13:12  Knee Imaging: Knee-R MR wo contrast: Results for orders placed during the hospital encounter of 11/30/15 MR Knee Right Wo  Contrast  Narrative CLINICAL DATA:  Chronic anterior and posterior knee pain. History of meniscus repair and osteoarthritis. No recent injury.  EXAM: MRI OF THE RIGHT KNEE WITHOUT CONTRAST  TECHNIQUE: Multiplanar, multisequence MR imaging of the knee was performed. No intravenous contrast was administered.  COMPARISON:  MRI 04/17/2013.  FINDINGS: MENISCI  Medial meniscus: Presumed interval partial meniscectomy. The posterior horn and body are mildly diminutive with mild free edge blunting. No recurrent meniscal tear or displaced fragment identified.  Lateral meniscus: Presumed interval partial meniscectomy. There is progressive horizontal signal within the meniscal body, best seen on the coronal images. Possible tiny parameniscal cyst peripheral to the meniscal body, best seen on coronal image 14. No displaced meniscal fragment.  LIGAMENTS  Cruciates:  Intact.  Collaterals:  Intact.  CARTILAGE  Patellofemoral: Stable patellofemoral chondral thinning and surface irregularity.  Medial: Stable chondral thinning, surface irregularity and osteophytes.  Lateral: Stable chondral thinning, surface irregularity and osteophytes.  MISCELLANEOUS  Joint: Small joint effusion. Mild synovial irregularity without definite loose body.  Popliteal Fossa:  Unremarkable. No significant Baker's cyst.  Extensor Mechanism:  Intact.  Bones:  No acute or significant extra-articular osseous findings.  Other: No significant periarticular soft tissue findings.  IMPRESSION: 1. Presumed interval partial meniscectomy medially and laterally. 2. Horizontal signal within the body of the lateral meniscus with possible adjacent tiny parameniscal cyst. These findings may indicate a small recurrent lateral meniscal tear. No displaced meniscal fragment. 3. Stable tricompartmental degenerative changes. No acute osseous findings. 4. The cruciate and collateral ligaments are  intact.   Electronically Signed By: Richardean Sale M.D. On: 11/30/2015 12:59  Knee-R CT wo contrast: Results for orders placed during the hospital encounter of 02/13/21 CT KNEE RIGHT WO CONTRAST  Narrative CLINICAL DATA:  Right knee pain and swelling below the right knee for 3 months. 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  EXAM: CT OF THE right KNEE WITHOUT CONTRAST  TECHNIQUE: Multidetector CT imaging of the right knee was performed according to the standard protocol. Multiplanar CT image reconstructions were also generated.  COMPARISON:  MRI right calf 01/06/2021, MRI right knee 11/30/2015  FINDINGS: Bones/Joint/Cartilage  Postsurgical changes are again seen of total right knee arthroplasty. Additional transverse screw within the distal femoral metaphysis. This hardware produces streak artifacts that limits evaluation of the distal femur greater than the proximal tibia. Within this limitation, no definite perihardware lucency is seen to indicate CT evidence of hardware loosening. Tiny joint effusion. No acute fracture is seen. No dislocation.  Ligaments  Suboptimally assessed by CT.  Muscles and Tendons  There is again focal fat seen within the far medial aspect of the mid to distal soleus musculature, as on 01/06/2021 MRI.  Soft tissues  Otherwise unremarkable.  IMPRESSION: Status post total right knee arthroplasty without evidence of hardware failure.   Electronically Signed By: Yvonne Kendall On: 02/14/2021 09:02  Complexity Note: Imaging results reviewed.                         Meds   Current Outpatient Medications:    acetaminophen (TYLENOL) 650 MG CR tablet, Take 650 mg by mouth every 8 (eight) hours as needed for pain., Disp: , Rfl:    aspirin EC 81 MG tablet, Take 81 mg by mouth daily., Disp: , Rfl:    atorvastatin (LIPITOR) 10 MG tablet, TAKE 1 TABLET BY MOUTH DAILY., Disp: 30 tablet,  Rfl: 2   azelastine (ASTELIN) 0.1 % nasal spray, 1 SPRAY IN EACH NOSTRIL TWICE DAILY AS DIRECTED, Disp: 30 mL, Rfl: 1  calcium carbonate (OS-CAL) 1250 (500 Ca) MG chewable tablet, Chew 1 tablet by mouth daily., Disp: , Rfl:    calcium carbonate (TUMS - DOSED IN MG ELEMENTAL CALCIUM) 500 MG chewable tablet, Chew 1 tablet by mouth as needed., Disp: , Rfl:    cetirizine (ZYRTEC) 10 MG tablet, Take by mouth., Disp: , Rfl:    cholecalciferol (VITAMIN D3) 25 MCG (1000 UNIT) tablet, Take 1,000 Units by mouth daily., Disp: , Rfl:    estradiol (ESTRACE) 0.5 MG tablet, TAKE 1 TABLET BY MOUTH DAILY., Disp: 90 tablet, Rfl: 1   fentaNYL (DURAGESIC) 100 MCG/HR, Place 1 patch onto the skin every 3 (three) days., Disp: 10 patch, Rfl: 0   [START ON 12/08/2021] fentaNYL (DURAGESIC) 12 MCG/HR, Place 1 patch onto the skin every 3 (three) days. Must last 30 days., Disp: 10 patch, Rfl: 0   fentaNYL (DURAGESIC) 25 MCG/HR patch, Apply 1 patch to skin every 2 days with fentanyl patch 100 g per hour if tolerated (Patient taking differently: Apply 1 patch to skin every 3 days with fentanyl patch 100 g per hour if tolerated), Disp: 15 patch, Rfl: 0   fluticasone (FLONASE) 50 MCG/ACT nasal spray, Place 2 sprays into both nostrils daily., Disp: 48 g, Rfl: 3   furosemide (LASIX) 20 MG tablet, TAKE 1 TABLET BY MOUTH DAILY AS NEEDED., Disp: 90 tablet, Rfl: 0   HYDROcodone-acetaminophen (NORCO/VICODIN) 5-325 MG tablet, Take 1 tablet by mouth every 6 (six) hours as needed., Disp: , Rfl:    Multiple Vitamin (MULTIVITAMIN) tablet, Take 1 tablet by mouth daily., Disp: , Rfl:    naloxone (NARCAN) nasal spray 4 mg/0.1 mL, Place 1 spray into the nose as needed for up to 365 doses (for opioid-induced respiratory depresssion). In case of emergency (overdose), spray once into each nostril. If no response within 3 minutes, repeat application and call [phone]., Disp: 1 each, Rfl: 0   omeprazole (PRILOSEC) 20 MG capsule, TAKE 1 CAPSULE BY MOUTH TWO  TIMES DAILY, Disp: 180 capsule, Rfl: 2   ondansetron (ZOFRAN) 4 MG tablet, Take 1 tablet (4 mg total) by mouth every 6 (six) hours as needed for nausea., Disp: 30 tablet, Rfl: 0   [START ON 12/15/2021] oxyCODONE (ROXICODONE) 5 MG immediate release tablet, Take 1 tablet (5 mg total) by mouth every 6 (six) hours., Disp: 120 tablet, Rfl: 0   senna-docusate (SENOKOT-S) 8.6-50 MG per tablet, Take by mouth daily., Disp: , Rfl:   ROS  Constitutional: Denies any fever or chills Gastrointestinal: No reported hemesis, hematochezia, vomiting, or acute GI distress Musculoskeletal: Denies any acute onset joint swelling, redness, loss of ROM, or weakness Neurological: No reported episodes of acute onset apraxia, aphasia, dysarthria, agnosia, amnesia, paralysis, loss of coordination, or loss of consciousness  Allergies  Ms. Signer is allergic to oxycodone hcl, oxycontin [oxycodone hcl], penicillins, chloral hydrate, ciprofloxacin, ciprofloxacin hcl, colace [docusate calcium], methotrexate derivatives, parafon forte dsc [chlorzoxazone], penicillin g, tape, bextra [valdecoxib], oxycodone, and rofecoxib.  [place]  Drug: Ms. Ericson  reports no history of drug use. Alcohol:  reports no history of alcohol use. Tobacco:  reports that she has never smoked. She has never used smokeless tobacco. Medical:  has a past medical history of Anemia, Arthritis, Bronchitis (08/2015), Chronic back pain, Depression, Diverticulosis, Esophageal stricture, Fibromyalgia, Fibromyalgia, Fibromyalgia, Follicular lymphoma ([place]), Follicular lymphoma ([place]), GERD (gastroesophageal reflux disease), GERD (gastroesophageal reflux disease), Hemorrhoid, Hiatal hernia, History of kidney stones, Hypercholesterolemia, IBS (irritable bowel syndrome), IBS (irritable bowel syndrome), Nephrolithiasis, PONV (postoperative nausea and vomiting),  Shingles outbreak (11/26/2011), and Sleep apnea. Surgical: Ms. Foulk  has a past surgical history that includes  Tonsilectomy/adenoidectomy with myringotomy (1970); Appendectomy; Tubal ligation (1976); Vaginal hysterectomy (1980's); Left oophorectomy; Ovarian cyst removal; Bladder surgery (1993); Cholecystectomy (1995); ORIF ankle fracture (2012); Rectocele repair (N/A); Breast biopsy (N/A); Leg Surgery (Left); Tonsillectomy (N/A); Replacement total knee (Right, 85/92/9244); history of helicobacter pylori infection; capsule endoscopy; Joint replacement (Right); Fracture surgery (Left); Esophagogastroduodenoscopy (egd) with propofol (N/A, 06/19/2017); Colonoscopy with propofol (N/A, 06/19/2017); Eye surgery; and Total knee revision (Right, 11/07/2021). Family: family history includes Arthritis in her maternal grandmother; Breast cancer in an other family member; Diabetes in her maternal grandfather and maternal grandmother; Heart disease in her maternal grandmother; Hypertension in her maternal grandmother; Lung cancer in an other family member; Ulcers in her father and mother.  Constitutional Exam  General appearance: Well nourished, well developed, and well hydrated. In no apparent acute distress Vitals:   12/04/21 0955  BP: 112/67  Pulse: 81  Resp: 16  Temp: (!) 96.8 F (36 C)  TempSrc: Temporal  SpO2: 97%  Weight: 175 lb (79.4 kg)  Height: _0  (1.702 m)   BMI Assessment: Estimated body mass index is 27.41 kg/m as calculated from the following:   Height as of this encounter: _1  (1.702 m).   Weight as of this encounter: 175 lb (79.4 kg).  BMI interpretation table: BMI level Category Range association with higher incidence of chronic pain  <18 kg/m2 Underweight   18.5-24.9 kg/m2 Ideal body weight   25-29.9 kg/m2 Overweight Increased incidence by 20%  30-34.9 kg/m2 Obese (Class I) Increased incidence by 68%  35-39.9 kg/m2 Severe obesity (Class II) Increased incidence by 136%  >40 kg/m2 Extreme obesity (Class III) Increased incidence by 254%   Patient's current BMI Ideal Body weight  Body mass  index is 27.41 kg/m. Ideal body weight: 61.6 kg (135 lb 12.9 oz) Adjusted ideal body weight: 68.7 kg (151 lb 7.7 oz)   BMI Readings from Last 4 Encounters:  12/04/21 27.41 kg/m  11/16/21 27.41 kg/m  11/07/21 26.63 kg/m  10/26/21 27.17 kg/m   Wt Readings from Last 4 Encounters:  12/04/21 175 lb (79.4 kg)  11/16/21 175 lb (79.4 kg)  11/07/21 170 lb (77.1 kg)  10/26/21 173 lb 8 oz (78.7 kg)    Psych/Mental status: Alert, oriented x 3 (person, place, & time)       Eyes: PERLA Respiratory: No evidence of acute respiratory distress  Assessment & Plan  Primary Diagnosis & Pertinent Problem List: The primary encounter diagnosis was Chronic pain syndrome. Diagnoses of Chronic low back pain (1ry area of Pain) (Bilateral) (R>L) w/o sciatica, Chronic thigh pain (2ry area of Pain) (Bilateral) (R>L), Chronic knee pain after total replacement (3ry area of Pain) (Right), Chronic shoulder pain (4th area of Pain) (Bilateral) (R>L), Osteoarthritis of acromioclavicular joints (Bilateral), Tendinopathy of rotator cuff, unspecified laterality, Shoulder enthesopathy, unspecified laterality, Grade 1 Anterolisthesis of lumbosacral spine of L5/S1, DDD (degenerative disc disease), lumbosacral, Lumbosacral facet arthropathy, Osteoarthritis of sacroiliac joints (Bilateral) (HCC), Chronic sacroiliac joint pain (Bilateral), Pharmacologic therapy, Chronic use of opiate for therapeutic purpose, Physical tolerance to opiate drug, Drug tolerance, sequela, Encounter for medication management, and Encounter for chronic pain management were also pertinent to this visit.  Visit Diagnosis: 1. Chronic pain syndrome   2. Chronic low back pain (1ry area of Pain) (Bilateral) (R>L) w/o sciatica   3. Chronic thigh pain (2ry area of Pain) (Bilateral) (R>L)   4. Chronic knee pain after  total replacement (3ry area of Pain) (Right)   5. Chronic shoulder pain (4th area of Pain) (Bilateral) (R>L)   6. Osteoarthritis of  acromioclavicular joints (Bilateral)   7. Tendinopathy of rotator cuff, unspecified laterality   8. Shoulder enthesopathy, unspecified laterality   9. Grade 1 Anterolisthesis of lumbosacral spine of L5/S1   10. DDD (degenerative disc disease), lumbosacral   11. Lumbosacral facet arthropathy   12. Osteoarthritis of sacroiliac joints (Bilateral) ([place])   13. Chronic sacroiliac joint pain (Bilateral)   14. Pharmacologic therapy   15. Chronic use of opiate for therapeutic purpose   16. Physical tolerance to opiate drug   17. Drug tolerance, sequela   18. Encounter for medication management   19. Encounter for chronic pain management    Problems updated and reviewed during this visit: Problem  Osteoarthritis of acromioclavicular joints (Bilateral)  Tendinopathy of rotator cuff (Bilateral)  Shoulder enthesopathy (Bilateral)   Possible bilateral rotator cuff tendinopathy.   Grade 1 Anterolisthesis of lumbosacral spine of L5/S1  Ddd (Degenerative Disc Disease), Lumbosacral  Lumbosacral facet arthropathy  Osteoarthritis of sacroiliac joints (Bilateral) (HCC)  Status Post Revision of Total Knee Replacement, Right  Lumbar facet syndrome  Chronic sacroiliac joint pain (Bilateral)  Lymphedema  Chronic Venous Insufficiency  Uti (Urinary Tract Infection)  Rectocele (Resolved)   Formatting of this note might be different from the original. Last Assessment & Plan:  Bowels stable.   Enterocele (Resolved)   Formatting of this note might be different from the original. Last Assessment & Plan:  Saw Dr Enzo Bi.  Referred to Dr Judeth Horn.  Is s/p surgery 11/02/13.     Plan of Care  Pharmacotherapy (Medications Ordered): Meds ordered this encounter  Medications   fentaNYL (DURAGESIC) 100 MCG/HR    Sig: Place 1 patch onto the skin every 3 (three) days.    Dispense:  10 patch    Refill:  0    DO NOT: delete (not duplicate); no partial-fill (will deny script to complete), no refill request  (F/U required). DISPENSE: 1 day early if closed on fill date. WARN: No CNS-depressants within 8 hrs of med.   fentaNYL (DURAGESIC) 12 MCG/HR    Sig: Place 1 patch onto the skin every 3 (three) days. Must last 30 days.    Dispense:  10 patch    Refill:  0    DO NOT: delete (not duplicate); no partial-fill (will deny script to complete), no refill request (F/U required). DISPENSE: 1 day early if closed on fill date. WARN: No CNS-depressants within 8 hrs of med.   oxyCODONE (ROXICODONE) 5 MG immediate release tablet    Sig: Take 1 tablet (5 mg total) by mouth every 6 (six) hours.    Dispense:  120 tablet    Refill:  0    DO NOT: delete (not duplicate); no partial-fill (will deny script to complete), no refill request (F/U required). DISPENSE: 1 day early if closed on fill date. WARN: No CNS-depressants within 8 hrs of med.   naloxone (NARCAN) nasal spray 4 mg/0.1 mL    Sig: Place 1 spray into the nose as needed for up to 365 doses (for opioid-induced respiratory depresssion). In case of emergency (overdose), spray once into each nostril. If no response within 3 minutes, repeat application and call [phone].    Dispense:  1 each    Refill:  0    Instruct patient in proper use of device.   Procedure Orders    No procedure(s) ordered today  Lab Orders  No laboratory test(s) ordered today   Imaging Orders  No imaging studies ordered today   Referral Orders  No referral(s) requested today    Pharmacological management:  Opioid Analgesics: I will not be prescribing any opioids at this time Membrane stabilizer: I will not be prescribing any at this time Muscle relaxant: I will not be prescribing any at this time NSAID: I will not be prescribing any at this time Other analgesic(s): I will not be prescribing any at this time      Interventional Therapies  Risk  Complexity Considerations:   Estimated body mass index is 26.94 kg/m as calculated from the following:   Height as of this  encounter: _0  (1.702 m).   Weight as of this encounter: 172 lb (78 kg). 2D6 Metabolic Activity: 6K08 Metabolic Activity: 3A4 Metabolic Activity: 3A5 Metabolic Activity: Intermediate Normal Normal Poor     Planned  Pending:   Downward taper plan:  The neck step then will be to bring down the fentanyl to 87.5 by using: Phase 1: (Approx. 7 weeks) Step 1: 75 mcg + 12.5 mcg patch x 1 wk.  Step 2: 75 mcg x 1 week.  Step 3: 50 mcg +12.5 mcg x 1 week.  Step 4: 50 mcg x 1 week.  Step 5: 25 mcg +12.5 mcg x 1 week.  Step 6: 25 mcg x 1 week.  Step 7: 12.5 mcg x 1 week.  (All of the above while maintaining the oxycodone IR 5 mg tablet, 1 tab p.o. 4 times daily)   Phase 2: (Approx. 6-7 weeks)  Step 1: Oxycodone 5 mg 4/day x1 week  Step 2: Oxycodone 5 mg 3/day x 1 week  Step 3: Oxycodone 5 mg 2/day x 1 week  Step 4: Oxycodone 5 mg 1/day x 1 week  Step 5:  "Drug Holiday" x2-3 weeks   Total estimated time: 14 weeks (Approx. 04/11/22)    Under consideration:   Pending further evaluation   Completed:   None at this time   Completed by other providers:   right ultrasound-guided pes anserine bursa injection (01/13/2021) by Rosalia Hammers, DO  Right total knee replacement (03/19/2016) by Franchot Mimes, MD  Right knee arthroscopy (05/22/2013) by Franchot Mimes, MD  Midline L4-5 LESI (10/18/2014) by Dr. Mohammed Kindle    Therapeutic  Palliative (PRN) options:   None established     Provider-requested follow-up: Return in about 30 days (around 01/03/2022) for Eval-day (M,W), (F2F), (MM). Recent Visits Date Type Provider Dept  10/12/21 Office Visit Milinda Pointer, MD [facility]  Showing recent visits within past 90 days and meeting all other requirements Today's Visits Date Type Provider Dept  12/04/21 Office Visit Milinda Pointer, MD [facility]  Showing today's visits and meeting all other requirements Future Appointments Date Type Provider Dept   12/25/21 Appointment Milinda Pointer, MD [facility]  Showing future appointments within next 90 days and meeting all other requirements  Primary Care Physician: Einar Pheasant, MD Note by: Gaspar Cola, MD Date: 12/04/2021; Time: 12:15 PM

## 2021-12-04 NOTE — Progress Notes (Signed)
Called to [facility], they do no have any active controlled Rx's Called to [facility] pharmacy, they did have 1 Rx for fentanyl 25 mcg/h.  That is now cancelled.

## 2021-12-06 DIAGNOSIS — M25561 Pain in right knee: Secondary | ICD-10-CM | POA: Diagnosis not present

## 2021-12-06 DIAGNOSIS — Z96651 Presence of right artificial knee joint: Secondary | ICD-10-CM | POA: Diagnosis not present

## 2021-12-07 ENCOUNTER — Encounter: Payer: Self-pay | Admitting: Internal Medicine

## 2021-12-07 ENCOUNTER — Ambulatory Visit (INDEPENDENT_AMBULATORY_CARE_PROVIDER_SITE_OTHER): Payer: Medicare Other | Admitting: Internal Medicine

## 2021-12-07 VITALS — BP 142/82 | HR 74 | Temp 98.0°F | Resp 17 | Ht 67.0 in | Wt 170.8 lb

## 2021-12-07 DIAGNOSIS — C8590 Non-Hodgkin lymphoma, unspecified, unspecified site: Secondary | ICD-10-CM | POA: Diagnosis not present

## 2021-12-07 DIAGNOSIS — M4696 Unspecified inflammatory spondylopathy, lumbar region: Secondary | ICD-10-CM | POA: Diagnosis not present

## 2021-12-07 DIAGNOSIS — D649 Anemia, unspecified: Secondary | ICD-10-CM | POA: Diagnosis not present

## 2021-12-07 DIAGNOSIS — E7800 Pure hypercholesterolemia, unspecified: Secondary | ICD-10-CM | POA: Diagnosis not present

## 2021-12-07 DIAGNOSIS — R059 Cough, unspecified: Secondary | ICD-10-CM | POA: Diagnosis not present

## 2021-12-07 DIAGNOSIS — Z0000 Encounter for general adult medical examination without abnormal findings: Secondary | ICD-10-CM

## 2021-12-07 DIAGNOSIS — R300 Dysuria: Secondary | ICD-10-CM

## 2021-12-07 DIAGNOSIS — R739 Hyperglycemia, unspecified: Secondary | ICD-10-CM | POA: Diagnosis not present

## 2021-12-07 DIAGNOSIS — Z96651 Presence of right artificial knee joint: Secondary | ICD-10-CM | POA: Diagnosis not present

## 2021-12-07 DIAGNOSIS — M545 Low back pain, unspecified: Secondary | ICD-10-CM | POA: Diagnosis not present

## 2021-12-07 DIAGNOSIS — K219 Gastro-esophageal reflux disease without esophagitis: Secondary | ICD-10-CM | POA: Diagnosis not present

## 2021-12-07 DIAGNOSIS — G8929 Other chronic pain: Secondary | ICD-10-CM

## 2021-12-07 MED ORDER — MUPIROCIN 2 % EX OINT: 1.0000 | TOPICAL_OINTMENT | Freq: Two times a day (BID) | CUTANEOUS | 0 refills | Status: DC

## 2021-12-07 MED ORDER — PREDNISONE 10 MG PO TABS: ORAL_TABLET | ORAL | 0 refills | Status: DC

## 2021-12-07 NOTE — Progress Notes (Signed)
Patient ID: Maria Keller, female   DOB: 25-Aug-1950, 71 y.o.   MRN: [id number]   Subjective:    Patient ID: Maria Keller, female    DOB: [birth year], 71 y.o.   MRN: [id number]   Patient here for  Chief Complaint  Patient presents with   Annual Exam    CPE    Cough   .   HPI With past history of total knee revision 30/1601, follicular lymphoma and hypercholesterolemia.  She comes in today to follow up on these issues as well as for a complete physical exam. Saw oncology 10/16/21 - stable.  Recommended f/u in one year. Evaluated by Dr Delana Meyer 10/16/21 - recommended compression hose.  Is s/p right TKA (11/07/21).  Per report, treated with bactrim - cellulitis.  Resolved. Doing well from her surgery.  PT.  Reports that approximately 10 days ago, increased aching and cough.  Low grade fever.  Covid negative.  Increased cough - productive. Some coughing fits.  No increased sob.  No vomiting.  Taking robitussin.  Using astelin and flonase.     Past Medical History:  Diagnosis Date   Anemia    iron deficiency and B12 deficiency   Arthritis    Bronchitis 08/2015   Chronic back pain    followed by Dr Primus Bravo   Depression    Diverticulosis    Esophageal stricture    requiring dilatation x 2   Fibromyalgia    Fibromyalgia    Fibromyalgia    Follicular lymphoma ([place])    Followed by Dr Leretha Pol, s/p chemo and XRT   Follicular lymphoma ([place])    GERD (gastroesophageal reflux disease)    GERD (gastroesophageal reflux disease)    Hemorrhoid    Hiatal hernia    History of kidney stones    Hypercholesterolemia    IBS (irritable bowel syndrome)    IBS (irritable bowel syndrome)    Nephrolithiasis    followed by Dr Bernardo Heater, s/p stents   PONV (postoperative nausea and vomiting)    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   Shingles outbreak 11/26/2011   Sleep apnea    Past Surgical History:  Procedure Laterality Date   APPENDECTOMY     BLADDER  SURGERY  1993   BREAST BIOPSY N/A    capsule endoscopy     CHOLECYSTECTOMY  1995   COLONOSCOPY WITH PROPOFOL N/A 06/19/2017   Procedure: COLONOSCOPY WITH PROPOFOL;  Surgeon: Manya Silvas, MD;  Location: [facility] ENDOSCOPY;  Service: Endoscopy;  Laterality: N/A;   ESOPHAGOGASTRODUODENOSCOPY (EGD) WITH PROPOFOL N/A 06/19/2017   Procedure: ESOPHAGOGASTRODUODENOSCOPY (EGD) WITH PROPOFOL;  Surgeon: Manya Silvas, MD;  Location: [facility] ENDOSCOPY;  Service: Endoscopy;  Laterality: N/A;   EYE SURGERY     both cataract remove   FRACTURE SURGERY Left    ankle, plate with 8 screws   history of helicobacter pylori infection     JOINT REPLACEMENT Right    knee   LEFT OOPHORECTOMY     LEG SURGERY Left    ORIF ANKLE FRACTURE  2012   OVARIAN CYST REMOVAL     right   RECTOCELE REPAIR N/A    REPLACEMENT TOTAL KNEE Right 03/19/2016   TONSILECTOMY/ADENOIDECTOMY WITH MYRINGOTOMY  1970   TONSILLECTOMY N/A    TOTAL KNEE REVISION Right 11/07/2021   Procedure: TOTAL KNEE REVISION;  Surgeon: Corky Mull, MD;  Location: [facility] ORS;  Service: Orthopedics;  Laterality: Right;   TUBAL LIGATION  1976   VAGINAL HYSTERECTOMY  1980's   secondary to bleeding   Family History  Problem Relation Age of Onset   Ulcers Mother    Ulcers Father    Breast cancer Other        great aunt and grandfather   Lung cancer Other        uncle   Hypertension Maternal Grandmother    Heart disease Maternal Grandmother        MI 57s   Diabetes Maternal Grandmother    Arthritis Maternal Grandmother    Diabetes Maternal Grandfather    Social History   Socioeconomic History   Marital status: Married    Spouse name: [name]   Number of children: Not on file   Years of education: Not on file   Highest education level: Not on file  Occupational History   Not on file  Tobacco Use   Smoking status: Never   Smokeless tobacco: Never  Vaping Use   Vaping Use: Never used  Substance and Sexual Activity   Alcohol use: No     Alcohol/week: 0.0 standard drinks of alcohol   Drug use: No   Sexual activity: Yes  Other Topics Concern   Not on file  Social History Narrative   Not on file   Social Determinants of Health   Financial Resource Strain: Low Risk  (08/23/2021)   Overall Financial Resource Strain (CARDIA)    Difficulty of Paying Living Expenses: Not hard at all  Food Insecurity: No Food Insecurity (11/07/2021)   Hunger Vital Sign    Worried About Running Out of Food in the Last Year: Never true    [place] in the Last Year: Never true  Transportation Needs: No Transportation Needs (11/07/2021)   PRAPARE - Hydrologist (Medical): No    Lack of Transportation (Non-Medical): No  Physical Activity: Insufficiently Active (08/19/2018)   Exercise Vital Sign    Days of Exercise per Week: 1 day    Minutes of Exercise per Session: 30 min  Stress: No Stress Concern Present (08/23/2021)   Ray    Feeling of Stress : Not at all  Social Connections: [place] (08/23/2021)   Social Connection and Isolation Panel [NHANES]    Frequency of Communication with Friends and Family: More than three times a week    Frequency of Social Gatherings with Friends and Family: More than three times a week    Attends Religious Services: More than 4 times per year    Active Member of [employer] or Organizations: Not on file    Attends Music therapist: More than 4 times per year    Marital Status: Married     Review of Systems  Constitutional:  Negative for appetite change and unexpected weight change.  HENT:  Positive for congestion and postnasal drip. Negative for sore throat.   Eyes:  Negative for pain and visual disturbance.  Respiratory:  Positive for cough. Negative for chest tightness and shortness of breath.   Cardiovascular:  Negative for chest pain, palpitations and leg swelling.   Gastrointestinal:  Negative for abdominal pain, diarrhea, nausea and vomiting.  Genitourinary:  Positive for frequency. Negative for difficulty urinating and dysuria.  Musculoskeletal:  Negative for joint swelling and myalgias.  Skin:  Negative for color change and rash.  Neurological:  Negative for dizziness and headaches.  Hematological:  Negative for adenopathy. Does not bruise/bleed easily.  Psychiatric/Behavioral:  Negative for agitation and dysphoric mood.        Objective:     BP (!) 142/82 (BP Location: Left Arm, Patient Position: Sitting, Cuff Size: Small)   Pulse 74   Temp 98 F (36.7 C) (Temporal)   Resp 17   Ht _0  (1.702 m)   Wt 170 lb 12.8 oz (77.5 kg)   SpO2 98%   BMI 26.75 kg/m  Wt Readings from Last 3 Encounters:  12/07/21 170 lb 12.8 oz (77.5 kg)  12/04/21 175 lb (79.4 kg)  11/16/21 175 lb (79.4 kg)    Physical Exam Vitals reviewed.  Constitutional:      General: She is not in acute distress.    Appearance: Normal appearance. She is well-developed.  HENT:     Head: Normocephalic and atraumatic.     Right Ear: External ear normal.     Left Ear: External ear normal.  Eyes:     General: No scleral icterus.       Right eye: No discharge.        Left eye: No discharge.     Conjunctiva/sclera: Conjunctivae normal.  Neck:     Thyroid: No thyromegaly.  Cardiovascular:     Rate and Rhythm: Normal rate and regular rhythm.  Pulmonary:     Effort: No tachypnea, accessory muscle usage or respiratory distress.     Breath sounds: Normal breath sounds. No decreased breath sounds or wheezing.  Chest:  Breasts:    Right: No inverted nipple, mass, nipple discharge or tenderness (no axillary adenopathy).     Left: No inverted nipple, mass, nipple discharge or tenderness (no axilarry adenopathy).  Abdominal:     General: Bowel sounds are normal.     Palpations: Abdomen is soft.     Tenderness: There is no abdominal tenderness.  Musculoskeletal:         General: No swelling or tenderness.     Cervical back: Neck supple.  Lymphadenopathy:     Cervical: No cervical adenopathy.  Skin:    Findings: No erythema or rash.  Neurological:     Mental Status: She is alert and oriented to person, place, and time.  Psychiatric:        Mood and Affect: Mood normal.        Behavior: Behavior normal.      Outpatient Encounter Medications as of 12/07/2021  Medication Sig   acetaminophen (TYLENOL) 650 MG CR tablet Take 650 mg by mouth every 8 (eight) hours as needed for pain.   aspirin EC 81 MG tablet Take 81 mg by mouth daily.   atorvastatin (LIPITOR) 10 MG tablet TAKE 1 TABLET BY MOUTH DAILY.   azelastine (ASTELIN) 0.1 % nasal spray 1 SPRAY IN EACH NOSTRIL TWICE DAILY AS DIRECTED   calcium carbonate (OS-CAL) 1250 (500 Ca) MG chewable tablet Chew 1 tablet by mouth daily.   calcium carbonate (TUMS - DOSED IN MG ELEMENTAL CALCIUM) 500 MG chewable tablet Chew 1 tablet by mouth as needed.   cetirizine (ZYRTEC) 10 MG tablet Take by mouth.   cholecalciferol (VITAMIN D3) 25 MCG (1000 UNIT) tablet Take 1,000 Units by mouth daily.   estradiol (ESTRACE) 0.5 MG tablet TAKE 1 TABLET BY MOUTH DAILY.   fentaNYL (DURAGESIC) 100 MCG/HR Place 1 patch onto the skin every 3 (three) days.   fentaNYL (DURAGESIC) 12 MCG/HR Place 1 patch onto the skin every 3 (three) days. Must last 30 days.   fentaNYL (DURAGESIC) 25 MCG/HR patch Apply 1  patch to skin every 2 days with fentanyl patch 100 g per hour if tolerated (Patient taking differently: Apply 1 patch to skin every 3 days with fentanyl patch 100 g per hour if tolerated)   fluticasone (FLONASE) 50 MCG/ACT nasal spray Place 2 sprays into both nostrils daily.   furosemide (LASIX) 20 MG tablet TAKE 1 TABLET BY MOUTH DAILY AS NEEDED.   HYDROcodone-acetaminophen (NORCO/VICODIN) 5-325 MG tablet Take 1 tablet by mouth every 6 (six) hours as needed.   Multiple Vitamin (MULTIVITAMIN) tablet Take 1 tablet by mouth daily.    mupirocin ointment (BACTROBAN) 2 % Apply 1 Application topically 2 (two) times daily.   naloxone (NARCAN) nasal spray 4 mg/0.1 mL Place 1 spray into the nose as needed for up to 365 doses (for opioid-induced respiratory depresssion). In case of emergency (overdose), spray once into each nostril. If no response within 3 minutes, repeat application and call [phone].   omeprazole (PRILOSEC) 20 MG capsule TAKE 1 CAPSULE BY MOUTH TWO TIMES DAILY   ondansetron (ZOFRAN) 4 MG tablet Take 1 tablet (4 mg total) by mouth every 6 (six) hours as needed for nausea.   oxyCODONE (ROXICODONE) 5 MG immediate release tablet Take 1 tablet (5 mg total) by mouth every 6 (six) hours.   predniSONE (DELTASONE) 10 MG tablet Take 4 tablets x 1 day and then decrease by 1/2 tablet per day until down to zero mg.   senna-docusate (SENOKOT-S) 8.6-50 MG per tablet Take by mouth daily.   No facility-administered encounter medications on file as of 12/07/2021.     Lab Results  Component Value Date   WBC 12.4 (H) 11/08/2021   HGB 10.6 (L) 11/08/2021   HCT 32.8 (L) 11/08/2021   PLT 187 11/08/2021   GLUCOSE 105 (H) 12/04/2021   CHOL 151 12/04/2021   TRIG 137.0 12/04/2021   HDL 50.00 12/04/2021   LDLDIRECT 164.2 02/11/2013   LDLCALC 74 12/04/2021   ALT 14 12/04/2021   AST 27 12/04/2021   NA 140 12/04/2021   K 4.4 12/04/2021   CL 104 12/04/2021   CREATININE 0.64 12/04/2021   BUN 14 12/04/2021   CO2 29 12/04/2021   TSH 1.61 02/28/2021   HGBA1C 5.1 12/04/2021    DG Knee Right Port  Result Date: 11/07/2021 CLINICAL DATA:  Status post right knee arthroplasty revision. EXAM: PORTABLE RIGHT KNEE - 1-2 VIEW COMPARISON:  CT right knee 02/13/2021 FINDINGS: Interval revision of the prior total right knee arthroplasty. There are moderately long femoral and tibial stems. No perihardware lucency is seen to indicate hardware failure or loosening. There are anterior surgical skin staples. Postoperative changes including subcutaneous and  intra-articular air. No acute fracture or dislocation. IMPRESSION: Interval revision of total right knee arthroplasty without evidence of hardware failure. Electronically Signed   By: Yvonne Kendall M.D.   On: 11/07/2021 15:05       Assessment & Plan:   Problem List Items Addressed This Visit     Anemia    Follow cbc.       Chronic low back pain (1ry area of Pain) (Bilateral) (R>L) w/o sciatica (Chronic)    History of chronic back pain.  Followed by pain clinic.  Appears to be stable.       Relevant Medications   predniSONE (DELTASONE) 10 MG tablet   Cough    Increased cough/congestion as outlined.  Having coughing fits.  Continue steroid nasal spray, astelin and robitussin.  Add prednisone taper as directed.  Follow.  Call with  update.       Dysuria    Recently treated.  Check urine to confirm no infection.       Gastro-esophageal reflux disease without esophagitis    No upper symptoms reported.  On omeprazole.        Health care maintenance - Primary    Physical today 12/07/21.  S/p hysterectomy.  Mammogram 06/02/21 - Birads II.  Colonosocpy 03/2013 - melanosis and internal hemorrhoids.       History of total knee replacement (Right)    Being followed by ortho.  PT.      Hypercholesterolemia    Continue lipitor. Unable to titrate dose.  Tolerating.   Low cholesterol diet and exercise.  Follow lipid panel and liver function tests. Have discussed zetia and possible repatha if problems with oral statins.  Lab Results  Component Value Date   CHOL 151 12/04/2021   HDL 50.00 12/04/2021   LDLCALC 74 12/04/2021   LDLDIRECT 164.2 02/11/2013   TRIG 137.0 12/04/2021   CHOLHDL 3 12/04/2021       Relevant Orders   Basic Metabolic Panel (BMET)   Lipid Profile   Hepatic function panel   CBC with Differential/Platelet   TSH   Hyperglycemia    Low carb diet and exercise.  Follow met b and a1c.      Relevant Orders   HgB A1c   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[facility])    Chronic back pain.  Followed by pain clinic.       Non-Hodgkin's lymphoma ([place]) (Chronic)    Followed by Dr Leretha Pol.  Stable.  Follows up yearly.       Relevant Medications   predniSONE (DELTASONE) 10 MG tablet     Einar Pheasant, MD

## 2021-12-07 NOTE — Assessment & Plan Note (Signed)
Physical today 12/07/21.  S/p hysterectomy.  Mammogram 06/02/21 - Birads II.  Colonosocpy 03/2013 - melanosis and internal hemorrhoids.

## 2021-12-08 LAB — POCT URINALYSIS DIPSTICK
Bilirubin, UA: NEGATIVE
Blood, UA: NEGATIVE
Glucose, UA: NEGATIVE
Ketones, UA: NEGATIVE
Leukocytes, UA: NEGATIVE
Nitrite, UA: NEGATIVE
Protein, UA: NEGATIVE
Spec Grav, UA: 1.02 (ref 1.010–1.025)
Urobilinogen, UA: 0.2 E.U./dL
pH, UA: 6 (ref 5.0–8.0)

## 2021-12-11 DIAGNOSIS — M25561 Pain in right knee: Secondary | ICD-10-CM | POA: Diagnosis not present

## 2021-12-11 DIAGNOSIS — Z96651 Presence of right artificial knee joint: Secondary | ICD-10-CM | POA: Diagnosis not present

## 2021-12-11 NOTE — Progress Notes (Deleted)
MRN : [id number]  Maria Keller is a 71 y.o. (1950-12-12) female who presents with chief complaint of legs swell.  History of Present Illness:   The patient presents to the office for evaluation of DVT.  DVT was identified at [facility] by Duplex ultrasound.  The initial symptoms were pain and swelling in the lower extremity.  The patient notes the leg continues to be very painful with dependency and swells quite a bite.  Symptoms are much better with elevation.  The patient notes minimal edema in the morning which steadily worsens throughout the day.    The patient has not been using compression therapy at this point.  No SOB or pleuritic chest pains.  No cough or hemoptysis.  No blood per rectum or blood in any sputum.  No excessive bruising per the patient.   No recent shortening of the patient's walking distance or new symptoms consistent with claudication.  No history of rest pain symptoms. No new ulcers or wounds of the lower extremities have occurred.  The patient denies amaurosis fugax or recent TIA symptoms. There are no recent neurological changes noted. No recent episodes of angina or shortness of breath documented.  No outpatient medications have been marked as taking for the 12/14/21 encounter (Appointment) with Delana Meyer, Dolores Lory, MD.    Past Medical History:  Diagnosis Date   Anemia    iron deficiency and B12 deficiency   Arthritis    Bronchitis 08/2015   Chronic back pain    followed by Dr Primus Bravo   Depression    Diverticulosis    Esophageal stricture    requiring dilatation x 2   Fibromyalgia    Fibromyalgia    Fibromyalgia    Follicular lymphoma ([place])    Followed by Dr Leretha Pol, s/p chemo and XRT   Follicular lymphoma ([place])    GERD (gastroesophageal reflux disease)    GERD (gastroesophageal reflux disease)    Hemorrhoid    Hiatal hernia    History of kidney stones    Hypercholesterolemia    IBS (irritable bowel syndrome)    IBS (irritable bowel  syndrome)    Nephrolithiasis    followed by Dr Bernardo Heater, s/p stents   PONV (postoperative nausea and vomiting)    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   Shingles outbreak 11/26/2011   Sleep apnea     Past Surgical History:  Procedure Laterality Date   APPENDECTOMY     BLADDER SURGERY  1993   BREAST BIOPSY N/A    capsule endoscopy     CHOLECYSTECTOMY  1995   COLONOSCOPY WITH PROPOFOL N/A 06/19/2017   Procedure: COLONOSCOPY WITH PROPOFOL;  Surgeon: Manya Silvas, MD;  Location: [facility] ENDOSCOPY;  Service: Endoscopy;  Laterality: N/A;   ESOPHAGOGASTRODUODENOSCOPY (EGD) WITH PROPOFOL N/A 06/19/2017   Procedure: ESOPHAGOGASTRODUODENOSCOPY (EGD) WITH PROPOFOL;  Surgeon: Manya Silvas, MD;  Location: [facility] ENDOSCOPY;  Service: Endoscopy;  Laterality: N/A;   EYE SURGERY     both cataract remove   FRACTURE SURGERY Left    ankle, plate with 8 screws   history of helicobacter pylori infection     JOINT REPLACEMENT Right    knee   LEFT OOPHORECTOMY     LEG SURGERY Left    ORIF ANKLE FRACTURE  2012   OVARIAN CYST REMOVAL     right   RECTOCELE REPAIR N/A    REPLACEMENT TOTAL KNEE Right 03/19/2016   TONSILECTOMY/ADENOIDECTOMY WITH  MYRINGOTOMY  1970   TONSILLECTOMY N/A    TOTAL KNEE REVISION Right 11/07/2021   Procedure: TOTAL KNEE REVISION;  Surgeon: Corky Mull, MD;  Location: [facility] ORS;  Service: Orthopedics;  Laterality: Right;   TUBAL LIGATION  1976   VAGINAL HYSTERECTOMY  1980's   secondary to bleeding    Social History Social History   Tobacco Use   Smoking status: Never   Smokeless tobacco: Never  Vaping Use   Vaping Use: Never used  Substance Use Topics   Alcohol use: No    Alcohol/week: 0.0 standard drinks of alcohol   Drug use: No    Family History Family History  Problem Relation Age of Onset   Ulcers Mother    Ulcers Father    Breast cancer Other        great aunt and grandfather   Lung cancer Other         uncle   Hypertension Maternal Grandmother    Heart disease Maternal Grandmother        MI 59s   Diabetes Maternal Grandmother    Arthritis Maternal Grandmother    Diabetes Maternal Grandfather     Allergies  Allergen Reactions   Oxycodone Hcl Itching    Other reaction(s): Headache (finding)   Oxycontin [Oxycodone Hcl] Itching    Sleep Apnea   Penicillins Anaphylaxis and Itching   Chloral Hydrate Hives    Other Reaction: Not Assessed   Ciprofloxacin Itching   Ciprofloxacin Hcl Itching    She can take Levaquin   Colace [Docusate Calcium]    Methotrexate Derivatives Other (See Comments)    Questionable    Parafon Forte Dsc [Chlorzoxazone] Itching and Other (See Comments)    Other Reaction: Not Assessed   Penicillin G Hives   Tape Other (See Comments)    adhesive  tape cause redness and skin to peel   Bextra [Valdecoxib] Itching    Other reaction(s): Other (See Comments) Other Reaction: Not Assessed   Oxycodone Itching   Rofecoxib Itching    Other reaction(s): Headache (finding)     REVIEW OF SYSTEMS (Negative unless checked)  Constitutional: '[]'$ Weight loss  '[]'$ Fever  '[]'$ Chills Cardiac: '[]'$ Chest pain   '[]'$ Chest pressure   '[]'$ Palpitations   '[]'$ Shortness of breath when laying flat   '[]'$ Shortness of breath with exertion. Vascular:  '[]'$ Pain in legs with walking   '[x]'$ Pain in legs with standing  '[]'$ History of DVT   '[]'$ Phlebitis   '[x]'$ Swelling in legs   '[]'$ Varicose veins   '[]'$ Non-healing ulcers Pulmonary:   '[]'$ Uses home oxygen   '[]'$ Productive cough   '[]'$ Hemoptysis   '[]'$ Wheeze  '[]'$ COPD   '[]'$ Asthma Neurologic:  '[]'$ Dizziness   '[]'$ Seizures   '[]'$ History of stroke   '[]'$ History of TIA  '[]'$ Aphasia   '[]'$ Vissual changes   '[]'$ Weakness or numbness in arm   '[]'$ Weakness or numbness in leg Musculoskeletal:   '[]'$ Joint swelling   '[]'$ Joint pain   '[]'$ Low back pain Hematologic:  '[]'$ Easy bruising  '[]'$ Easy bleeding   '[]'$ Hypercoagulable state   '[]'$ Anemic Gastrointestinal:  '[]'$ Diarrhea   '[]'$ Vomiting  '[x]'$ Gastroesophageal reflux/heartburn    '[]'$ Difficulty swallowing. Genitourinary:  '[]'$ Chronic kidney disease   '[]'$ Difficult urination  '[]'$ Frequent urination   '[]'$ Blood in urine Skin:  '[]'$ Rashes   '[]'$ Ulcers  Psychological:  '[]'$ History of anxiety   '[]'$  History of major depression.  Physical Examination  There were no vitals filed for this visit. There is no height or weight on file to calculate BMI. Gen: WD/WN, NAD Head: /AT, No temporalis wasting.  Ear/Nose/Throat: Hearing  grossly intact, nares w/o erythema or drainage, pinna without lesions Eyes: PER, EOMI, sclera nonicteric.  Neck: Supple, no gross masses.  No JVD.  Pulmonary:  Good air movement, no audible wheezing, no use of accessory muscles.  Cardiac: RRR, precordium not hyperdynamic. Vascular:  scattered varicosities present bilaterally.  Mild venous stasis changes to the legs bilaterally.  3-4+ soft pitting edema, CEAP [id number]  Vessel Right Left  Radial Palpable Palpable  Gastrointestinal: soft, non-distended. No guarding/no peritoneal signs.  Musculoskeletal: M/S 5/5 throughout.  No deformity.  Neurologic: CN 2-12 intact. Pain and light touch intact in extremities.  Symmetrical.  Speech is fluent. Motor exam as listed above. Psychiatric: Judgment intact, Mood & affect appropriate for pt's clinical situation. Dermatologic: Venous rashes no ulcers noted.  No changes consistent with cellulitis. Lymph : No lichenification or skin changes of chronic lymphedema.  CBC Lab Results  Component Value Date   WBC 12.4 (H) 11/08/2021   HGB 10.6 (L) 11/08/2021   HCT 32.8 (L) 11/08/2021   MCV 84.8 11/08/2021   PLT 187 11/08/2021    BMET    Component Value Date/Time   NA 140 12/04/2021 0755   NA 142 10/16/2021 0000   NA 138 11/06/2012 1031   K 4.4 12/04/2021 0755   K 4.0 11/06/2012 1031   CL 104 12/04/2021 0755   CL 100 11/06/2012 1031   CO2 29 12/04/2021 0755   CO2 35 (H) 11/06/2012 1031   GLUCOSE 105 (H) 12/04/2021 0755   GLUCOSE 88 11/06/2012 1031   BUN 14 12/04/2021  0755   BUN 11 10/16/2021 0000   BUN 13 11/06/2012 1031   CREATININE 0.64 12/04/2021 0755   CREATININE 0.90 11/06/2012 1031   CALCIUM 8.7 12/04/2021 0755   CALCIUM 9.0 11/06/2012 1031   GFRNONAA >60 11/08/2021 0547   GFRNONAA >60 11/06/2012 1031   GFRAA >60 11/06/2012 1031   Estimated Creatinine Clearance: 67.2 mL/min (by C-G formula based on SCr of 0.64 mg/dL).  COAG No results found for: "INR", "PROTIME"  Radiology No results found.   Assessment/Plan There are no diagnoses linked to this encounter.   Hortencia Pilar, MD  12/11/2021 1:03 PM

## 2021-12-14 ENCOUNTER — Encounter (INDEPENDENT_AMBULATORY_CARE_PROVIDER_SITE_OTHER): Payer: Medicare Other

## 2021-12-14 ENCOUNTER — Ambulatory Visit (INDEPENDENT_AMBULATORY_CARE_PROVIDER_SITE_OTHER): Payer: Medicare Other | Admitting: Vascular Surgery

## 2021-12-14 DIAGNOSIS — I890 Lymphedema, not elsewhere classified: Secondary | ICD-10-CM

## 2021-12-14 DIAGNOSIS — M5136 Other intervertebral disc degeneration, lumbar region: Secondary | ICD-10-CM

## 2021-12-14 DIAGNOSIS — E7800 Pure hypercholesterolemia, unspecified: Secondary | ICD-10-CM

## 2021-12-14 DIAGNOSIS — I872 Venous insufficiency (chronic) (peripheral): Secondary | ICD-10-CM

## 2021-12-17 ENCOUNTER — Encounter: Payer: Self-pay | Admitting: Internal Medicine

## 2021-12-17 NOTE — Assessment & Plan Note (Signed)
Followed by Dr Leretha Pol.  Stable.  Follows up yearly.

## 2021-12-17 NOTE — Assessment & Plan Note (Signed)
Continue lipitor. Unable to titrate dose.  Tolerating.   Low cholesterol diet and exercise.  Follow lipid panel and liver function tests. Have discussed zetia and possible repatha if problems with oral statins.  Lab Results  Component Value Date   CHOL 151 12/04/2021   HDL 50.00 12/04/2021   LDLCALC 74 12/04/2021   LDLDIRECT 164.2 02/11/2013   TRIG 137.0 12/04/2021   CHOLHDL 3 12/04/2021

## 2021-12-17 NOTE — Addendum Note (Signed)
Addended by: Alisa Graff on: 12/17/2021 11:43 AM   Modules accepted: Level of Service

## 2021-12-17 NOTE — Assessment & Plan Note (Signed)
Low carb diet and exercise.  Follow met b and a1c.

## 2021-12-17 NOTE — Assessment & Plan Note (Signed)
Chronic back pain.  Followed by pain clinic.

## 2021-12-17 NOTE — Assessment & Plan Note (Signed)
Recently treated.  Check urine to confirm no infection.

## 2021-12-17 NOTE — Assessment & Plan Note (Signed)
Follow cbc.  

## 2021-12-17 NOTE — Assessment & Plan Note (Signed)
Being followed by ortho.  PT.

## 2021-12-17 NOTE — Assessment & Plan Note (Signed)
No upper symptoms reported.  On omeprazole.  

## 2021-12-17 NOTE — Assessment & Plan Note (Signed)
History of chronic back pain.  Followed by pain clinic.  Appears to be stable.

## 2021-12-17 NOTE — Assessment & Plan Note (Signed)
Increased cough/congestion as outlined.  Having coughing fits.  Continue steroid nasal spray, astelin and robitussin.  Add prednisone taper as directed.  Follow.  Call with update.

## 2021-12-19 DIAGNOSIS — Z96651 Presence of right artificial knee joint: Secondary | ICD-10-CM | POA: Diagnosis not present

## 2021-12-19 DIAGNOSIS — M25561 Pain in right knee: Secondary | ICD-10-CM | POA: Diagnosis not present

## 2021-12-22 DIAGNOSIS — Z96651 Presence of right artificial knee joint: Secondary | ICD-10-CM | POA: Diagnosis not present

## 2021-12-24 NOTE — Progress Notes (Unsigned)
PROVIDER NOTE: Information contained herein reflects review and annotations entered in association with encounter. Interpretation of such information and data should be left to medically-trained personnel. Information provided to patient can be located elsewhere in the medical record under "Patient Instructions". Document created using STT-dictation technology, any transcriptional errors that may result from process are unintentional.    Patient: Maria Keller  Service Category: E/M  Provider: Gaspar Cola, MD  DOB: 09-May-1950  DOS: 12/25/2021  Referring Provider: Einar Pheasant, MD  MRN: [id number]  Specialty: Interventional Pain Management  PCP: Einar Pheasant, MD  Type: Established Patient  Setting: Ambulatory outpatient    Location: Office  Delivery: Face-to-face     HPI  [name], a 71 y.o. year old female, is here today because of her No primary diagnosis found.. Maria Keller primary complain today is No chief complaint on file. Last encounter: My last encounter with her was on 12/04/2021. Pertinent problems: Maria Keller has History of lymphoma; Fibromyalgia; Pain in joint involving multiple sites; Non-Hodgkin's lymphoma ([place]); Chronic low back pain (1ry area of Pain) (Bilateral) (R>L) w/o sciatica; Chronic sacroiliac joint pain (Bilateral); DDD (degenerative disc disease), lumbar; Lumbar facet syndrome; Sacroiliac joint dysfunction; Postherpetic neuralgia; Degenerative disc disease, lumbar; Lumbar radiculopathy; Headache; 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[facility]); Tingling sensation; Knee swelling; Loose total knee arthroplasty, sequela (Right); 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, right; History of shingles; History of urinary stone; Intractable migraine with aura without status migrainosus; Myalgia and myositis; Osteoarthritis; Primary osteoarthritis of right knee; History of total knee replacement (Right); Lymphoma ([place]); Chronic pain syndrome; Chronic thigh pain (2ry area of  Pain) (Bilateral) (R>L); Chronic knee pain after total replacement (3ry area of Pain) (Right); Chronic shoulder pain (4th area of Pain) (Bilateral) (R>L); Status post revision of total knee replacement, right; Osteoarthritis of acromioclavicular joints (Bilateral); Tendinopathy of rotator cuff (Bilateral); Shoulder enthesopathy (Bilateral); Grade 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 Pain Assessment: Severity of   is reported as a  /10. Location:    / . Onset:  . Quality:  . Timing:  . Modifying factor(s):  Marland Kitchen Vitals:  vitals were not taken for this visit.   Reason for encounter:  *** . ***  Pharmacotherapy Assessment  Analgesic: Oxycodone IR 5 mg tablet, 1 tab p.o. 5 times daily (#150) (last filled on 09/20/2021) (37.5 MME/day); fentanyl 25 mcg/h patch every 48 hours (#15) (last filled on 09/15/2021) (90 MME/day) + fentanyl 100 mcg/h patch every 48 hours (# 15) (last filled on 09/12/2021) (360 MME/day). MME/day: 487.5 mg/day   Monitoring: [place] PMP: PDMP reviewed during this encounter.       Pharmacotherapy: No side-effects or adverse reactions reported. Compliance: No problems identified. Effectiveness: Clinically acceptable.  No notes on file  No results found for: "CBDTHCR" No results found for: "D8THCCBX" No results found for: "D9THCCBX"  UDS:  Summary  Date Value Ref Range Status  10/12/2021 Note  Final    Comment:    ==================================================================== Compliance Drug Analysis, Ur ==================================================================== Test                             Result       Flag       Units  Drug Present and Declared for Prescription Verification   Oxycodone  79           EXPECTED   ng/mg creat   Oxymorphone                    47            EXPECTED   ng/mg creat   Noroxycodone                   611          EXPECTED   ng/mg creat    Sources of oxycodone include scheduled prescription medications.    Oxymorphone and noroxycodone are expected metabolites of oxycodone.    Oxymorphone is also available as a scheduled prescription medication.    Fentanyl                       >231         EXPECTED   ng/mg creat   Norfentanyl                    >463         EXPECTED   ng/mg creat    Source of fentanyl is a scheduled prescription medication, including    IV, patch, and transmucosal formulations. Norfentanyl is an expected    metabolite of fentanyl.  Drug Present not Declared for Prescription Verification   Acetaminophen                  PRESENT      UNEXPECTED  Drug Absent but Declared for Prescription Verification   Salicylate                     Not Detected UNEXPECTED    Aspirin, as indicated in the declared medication list, is not always    detected even when used as directed.    Lidocaine                      Not Detected UNEXPECTED    Lidocaine, as indicated in the declared medication list, is not    always detected even when used as directed.  ==================================================================== Test                      Result    Flag   Units      Ref Range   Creatinine              216              mg/dL      >=20 ==================================================================== Declared Medications:  The flagging and interpretation on this report are based on the  following declared medications.  Unexpected results may arise from  inaccuracies in the declared medications.   **Note: The testing scope of this panel includes these medications:   Fentanyl (Duragesic)  Oxycodone (OxyIR)   **Note: The testing scope of this panel does not include small to  moderate amounts of these reported medications:   Aspirin  Topical Lidocaine (Lidoderm)   **Note: The testing scope of this panel does not  include the  following reported medications:   Atorvastatin (Lipitor)  Azelastine (Astelin)  Calcium (Tums)  Cetirizine (Zyrtec)  Docusate (Senokot-S)  Estradiol (Estrace)  Fluconazole (Diflucan)  Fluticasone (Flonase)  Furosemide (Lasix)  Mouthwash  Mupirocin (Bactroban)  Nitrofurantoin (Macrobid)  Omeprazole (Prilosec)  Sennosides (Senokot-S) ==================================================================== For clinical consultation, please call [phone]. ====================================================================  ROS  Constitutional: Denies any fever or chills Gastrointestinal: No reported hemesis, hematochezia, vomiting, or acute GI distress Musculoskeletal: Denies any acute onset joint swelling, redness, loss of ROM, or weakness Neurological: No reported episodes of acute onset apraxia, aphasia, dysarthria, agnosia, amnesia, paralysis, loss of coordination, or loss of consciousness  Medication Review  HYDROcodone-acetaminophen, acetaminophen, aspirin EC, atorvastatin, azelastine, calcium carbonate, cetirizine, cholecalciferol, estradiol, fentaNYL, fluticasone, furosemide, multivitamin, mupirocin ointment, naloxone, omeprazole, ondansetron, oxyCODONE, predniSONE, and senna-docusate  History Review  Allergy: Maria Keller is allergic to oxycodone hcl, oxycontin [oxycodone hcl], penicillins, chloral hydrate, ciprofloxacin, ciprofloxacin hcl, colace [docusate calcium], methotrexate derivatives, parafon forte dsc [chlorzoxazone], penicillin g, tape, bextra [valdecoxib], oxycodone, and rofecoxib. Drug: Maria Keller  reports no history of drug use. Alcohol:  reports no history of alcohol use. Tobacco:  reports that she has never smoked. She has never used smokeless tobacco. Social: Maria Keller  reports that she has never smoked. She has never used smokeless tobacco. She reports that she does not drink alcohol and does not use drugs. Medical:  has a past medical  history of Anemia, Arthritis, Bronchitis (08/2015), Chronic back pain, Depression, Diverticulosis, Esophageal stricture, Fibromyalgia, Fibromyalgia, Fibromyalgia, Follicular lymphoma ([place]), Follicular lymphoma ([place]), GERD (gastroesophageal reflux disease), GERD (gastroesophageal reflux disease), Hemorrhoid, Hiatal hernia, History of kidney stones, Hypercholesterolemia, IBS (irritable bowel syndrome), IBS (irritable bowel syndrome), Nephrolithiasis, PONV (postoperative nausea and vomiting), Shingles outbreak (11/26/2011), and Sleep apnea. Surgical: Maria Keller  has a past surgical history that includes Tonsilectomy/adenoidectomy with myringotomy (1970); Appendectomy; Tubal ligation (1976); Vaginal hysterectomy (1980's); Left oophorectomy; Ovarian cyst removal; Bladder surgery (1993); Cholecystectomy (1995); ORIF ankle fracture (2012); Rectocele repair (N/A); Breast biopsy (N/A); Leg Surgery (Left); Tonsillectomy (N/A); Replacement total knee (Right, 19/14/7829); history of helicobacter pylori infection; capsule endoscopy; Joint replacement (Right); Fracture surgery (Left); Esophagogastroduodenoscopy (egd) with propofol (N/A, 06/19/2017); Colonoscopy with propofol (N/A, 06/19/2017); Eye surgery; and Total knee revision (Right, 11/07/2021). Family: family history includes Arthritis in her maternal grandmother; Breast cancer in an other family member; Diabetes in her maternal grandfather and maternal grandmother; Heart disease in her maternal grandmother; Hypertension in her maternal grandmother; Lung cancer in an other family member; Ulcers in her father and mother.  Laboratory Chemistry Profile   Renal Lab Results  Component Value Date   BUN 14 12/04/2021   CREATININE 0.64 12/04/2021   BCR 13 10/12/2021   GFR 87.92 12/04/2021   GFRAA >60 11/06/2012   GFRNONAA >60 11/08/2021    Hepatic Lab Results  Component Value Date   AST 27 12/04/2021   ALT 14 12/04/2021   ALBUMIN 3.7 12/04/2021   ALKPHOS 102  12/04/2021   HCVAB NEGATIVE 05/04/2015    Electrolytes Lab Results  Component Value Date   NA 140 12/04/2021   K 4.4 12/04/2021   CL 104 12/04/2021   CALCIUM 8.7 12/04/2021   MG 2.4 (H) 10/12/2021    Bone Lab Results  Component Value Date   25OHVITD1 32 10/12/2021   25OHVITD2 <1.0 10/12/2021   25OHVITD3 32 10/12/2021    Inflammation (CRP: Acute Phase) (ESR: Chronic Phase) Lab Results  Component Value Date   CRP 3 10/12/2021   ESRSEDRATE 24 10/12/2021         Note: Above Lab results reviewed.  Recent Imaging Review  DG Knee Right Port CLINICAL DATA:  Status post right knee arthroplasty revision.  EXAM: PORTABLE RIGHT KNEE - 1-2 VIEW  COMPARISON:  CT right knee 02/13/2021  FINDINGS: Interval revision of the prior total right knee arthroplasty. There are  moderately long femoral and tibial stems. No perihardware lucency is seen to indicate hardware failure or loosening. There are anterior surgical skin staples. Postoperative changes including subcutaneous and intra-articular air. No acute fracture or dislocation.  IMPRESSION: Interval revision of total right knee arthroplasty without evidence of hardware failure.  Electronically Signed   By: Yvonne Kendall M.D.   On: 11/07/2021 15:05 Note: Reviewed        Physical Exam  General appearance: Well nourished, well developed, and well hydrated. In no apparent acute distress Mental status: Alert, oriented x 3 (person, place, & time)       Respiratory: No evidence of acute respiratory distress Eyes: PERLA Vitals: There were no vitals taken for this visit. BMI: Estimated body mass index is 26.75 kg/m as calculated from the following:   Height as of 12/07/21: 5' 7" (1.702 m).   Weight as of 12/07/21: 170 lb 12.8 oz (77.5 kg). Ideal: Patient weight not recorded  Assessment   Diagnosis Status  No diagnosis found. Controlled Controlled Controlled   Updated Problems: No problems updated.  Plan of Care   Problem-specific:  No problem-specific Assessment & Plan notes found for this encounter.  Maria Keller has a current medication list which includes the following long-term medication(s): atorvastatin, azelastine, calcium carbonate, cetirizine, estradiol, fentanyl, fentanyl, fentanyl, fluticasone, furosemide, omeprazole, and oxycodone.  Pharmacotherapy (Medications Ordered): No orders of the defined types were placed in this encounter.  Orders:  No orders of the defined types were placed in this encounter.  Follow-up plan:   No follow-ups on file.     Interventional Therapies  Risk  Complexity Considerations:   Estimated body mass index is 26.94 kg/m as calculated from the following:   Height as of this encounter: 5' 7" (1.702 m).   Weight as of this encounter: 172 lb (78 kg). 2D6 Metabolic Activity: 9I26 Metabolic Activity: 3A4 Metabolic Activity: 3A5 Metabolic Activity: Intermediate Normal Normal Poor     Planned  Pending:   Downward taper plan:  The neck step then will be to bring down the fentanyl to 87.5 by using: Phase 1: (Approx. 7 weeks) Step 1: 75 mcg + 12.5 mcg patch x 1 wk.  Step 2: 75 mcg x 1 week.  Step 3: 50 mcg +12.5 mcg x 1 week.  Step 4: 50 mcg x 1 week.  Step 5: 25 mcg +12.5 mcg x 1 week.  Step 6: 25 mcg x 1 week.  Step 7: 12.5 mcg x 1 week.  (All of the above while maintaining the oxycodone IR 5 mg tablet, 1 tab p.o. 4 times daily)   Phase 2: (Approx. 6-7 weeks)  Step 1: Oxycodone 5 mg 4/day x1 week  Step 2: Oxycodone 5 mg 3/day x 1 week  Step 3: Oxycodone 5 mg 2/day x 1 week  Step 4: Oxycodone 5 mg 1/day x 1 week  Step 5:  "Drug Holiday" x2-3 weeks   Total estimated time: 14 weeks (Approx. 04/11/22)    Under consideration:   Pending further evaluation   Completed:   None at this time   Completed by other providers:   right ultrasound-guided pes anserine bursa injection (01/13/2021) by Rosalia Hammers, DO  Right total knee replacement  (03/19/2016) by Franchot Mimes, MD  Right knee arthroscopy (05/22/2013) by Franchot Mimes, MD  Midline L4-5 LESI (10/18/2014) by Dr. Mohammed Kindle    Therapeutic  Palliative (PRN) options:   None established      Recent Visits Date  Type Provider Dept  12/04/21 Office Visit Milinda Pointer, MD [facility]  10/12/21 Office Visit Milinda Pointer, MD [facility]  Showing recent visits within past 90 days and meeting all other requirements Future Appointments Date Type Provider Dept  12/25/21 Appointment Milinda Pointer, MD [facility]  Showing future appointments within next 90 days and meeting all other requirements  I discussed the assessment and treatment plan with the patient. The patient was provided an opportunity to ask questions and all were answered. The patient agreed with the plan and demonstrated an understanding of the instructions.  Patient advised to call back or seek an in-person evaluation if the symptoms or condition worsens.  Duration of encounter: *** minutes.  Total time on encounter, as per AMA guidelines included both the face-to-face and non-face-to-face time personally spent by the physician and/or other qualified health care professional(s) on the day of the encounter (includes time in activities that require the physician or other qualified health care professional and does not include time in activities normally performed by clinical staff). Physician's time may include the following activities when performed: preparing to see the patient (eg, review of tests, pre-charting review of records) obtaining and/or reviewing separately obtained history performing a medically appropriate examination and/or evaluation counseling and educating the patient/family/caregiver ordering medications, tests, or procedures referring and communicating with other health care professionals (when not separately reported) documenting clinical  information in the electronic or other health record independently interpreting results (not separately reported) and communicating results to the patient/ family/caregiver care coordination (not separately reported)  Note by: Gaspar Cola, MD Date: 12/25/2021; Time: 2:21 PM

## 2021-12-25 ENCOUNTER — Encounter: Payer: Self-pay | Admitting: Pain Medicine

## 2021-12-25 ENCOUNTER — Ambulatory Visit: Payer: Medicare Other | Attending: Pain Medicine | Admitting: Pain Medicine

## 2021-12-25 DIAGNOSIS — M79651 Pain in right thigh: Secondary | ICD-10-CM | POA: Diagnosis present

## 2021-12-25 DIAGNOSIS — M25511 Pain in right shoulder: Secondary | ICD-10-CM | POA: Insufficient documentation

## 2021-12-25 DIAGNOSIS — G894 Chronic pain syndrome: Secondary | ICD-10-CM | POA: Diagnosis present

## 2021-12-25 DIAGNOSIS — M25512 Pain in left shoulder: Secondary | ICD-10-CM | POA: Insufficient documentation

## 2021-12-25 DIAGNOSIS — Z96651 Presence of right artificial knee joint: Secondary | ICD-10-CM | POA: Insufficient documentation

## 2021-12-25 DIAGNOSIS — M79652 Pain in left thigh: Secondary | ICD-10-CM | POA: Insufficient documentation

## 2021-12-25 DIAGNOSIS — F1190 Opioid use, unspecified, uncomplicated: Secondary | ICD-10-CM | POA: Diagnosis present

## 2021-12-25 DIAGNOSIS — Z79891 Long term (current) use of opiate analgesic: Secondary | ICD-10-CM | POA: Insufficient documentation

## 2021-12-25 DIAGNOSIS — M545 Low back pain, unspecified: Secondary | ICD-10-CM | POA: Insufficient documentation

## 2021-12-25 DIAGNOSIS — G8929 Other chronic pain: Secondary | ICD-10-CM | POA: Insufficient documentation

## 2021-12-25 DIAGNOSIS — M25561 Pain in right knee: Secondary | ICD-10-CM | POA: Diagnosis present

## 2021-12-25 DIAGNOSIS — T50905S Adverse effect of unspecified drugs, medicaments and biological substances, sequela: Secondary | ICD-10-CM | POA: Insufficient documentation

## 2021-12-25 DIAGNOSIS — Z79899 Other long term (current) drug therapy: Secondary | ICD-10-CM | POA: Diagnosis present

## 2021-12-25 MED ORDER — OXYCODONE HCL 5 MG PO TABS: ORAL_TABLET | ORAL | 0 refills | Status: DC

## 2021-12-25 MED ORDER — FENTANYL 25 MCG/HR TD PT72: 1.0000 | MEDICATED_PATCH | TRANSDERMAL | 0 refills | Status: DC

## 2021-12-25 MED ORDER — FENTANYL 50 MCG/HR TD PT72: 1.0000 | MEDICATED_PATCH | TRANSDERMAL | 0 refills | Status: DC

## 2021-12-25 NOTE — Progress Notes (Signed)
Safety precautions to be maintained throughout the outpatient stay will include: orient to surroundings, keep bed in low position, maintain call bell within reach at all times, provide assistance with transfer out of bed and ambulation.    Nursing Pain Medication Assessment:  Safety precautions to be maintained throughout the outpatient stay will include: orient to surroundings, keep bed in low position, maintain call bell within reach at all times, provide assistance with transfer out of bed and ambulation.  Medication Inspection Compliance: Pill count conducted under aseptic conditions, in front of the patient. Neither the pills nor the bottle was removed from the patient's sight at any time. Once count was completed pills were immediately returned to the patient in their original bottle.  Medication #1: Fentanyl patch 100 mcg Pill/Patch Count:  03 of 05 patches remain Pill/Patch Appearance: Markings consistent with prescribed medication Bottle Appearance: Standard pharmacy container. Clearly labeled. Filled Date: 29 / 30 / 2023 Last Medication intake:  Placed patch 3 days ago on Saturday 12/23/21.   Medication #2: Fentanyl patch 12 mcg Pill/Patch Count:  4 of 5 patches remain Pill/Patch Appearance: Markings consistent with prescribed medication Bottle Appearance: Standard pharmacy container. Clearly labeled. Filled Date: 37 / 3 / 2023 Last Medication intake:  Used times x1 only has prescribed by Dr. Consuela Mimes - does not remember exactly when   Medication #3: Fentanyl patch 12 mcg Pill/Patch Count: 5 of 5 patches remain Pill/Patch Appearance: Markings consistent with prescribed medication Bottle Appearance: Standard pharmacy container. Clearly labeled. Filled Date: 43 / 3 / 2023 Last Medication intake:  Has not open this box.   Medication #4: Oxycodone IR Pill/Patch Count: 98 of 150 pills remain Pill/Patch Appearance: Markings consistent with prescribed medication Bottle Appearance:  Standard pharmacy container. Clearly labeled. Filled Date: 09 / 12 / 2023 Last Medication intake:  Today  Medication #5: Oxycodone IR Pill/Patch Count: 120 of 120 pills remain Pill/Patch Appearance: Markings consistent with prescribed medication Bottle Appearance: Standard pharmacy container. Clearly labeled. Filled Date: 80 / 10 / 2023 Last Medication intake:  Has not started taking this bottle as she has plenty left from 10/17/21 fill.

## 2021-12-25 NOTE — Progress Notes (Signed)
Served as witness for Fentanyl 161mg patch. Walked to outpatient pharmacy and meet up with patient to dispose of 3 patches of 1085m Fentanyl.  Fentanyl patches inserted inside green disposal box.   EvAl DecantRN

## 2021-12-25 NOTE — Patient Instructions (Signed)
____________________________________________________________________________________________  Drug Holidays (Slow)  What is a "Drug Holiday"? Drug Holiday: is the name given to the period of time during which a patient stops taking a medication(s) for the purpose of eliminating tolerance to the drug.  Benefits Improved effectiveness of opioids. Decreased opioid dose needed to achieve benefits. Improved pain with lesser dose.  What is tolerance? Tolerance: is the progressive decreased in effectiveness of a drug due to its repetitive use. With repetitive use, the body gets use to the medication and as a consequence, it loses its effectiveness. This is a common problem seen with opioid pain medications. As a result, a larger dose of the drug is needed to achieve the same effect that used to be obtained with a smaller dose.  How long should a "Drug Holiday" last? You should stay off of the pain medicine for at least 14 consecutive days. (2 weeks)  Should I stop the medicine "cold [place]"? No. You should always coordinate with your Pain Specialist so that he/she can provide you with the correct medication dose to make the transition as smoothly as possible.  How do I stop the medicine? Slowly. You will be instructed to decrease the daily amount of pills that you take by one (1) pill every seven (7) days. This is called a "slow downward taper" of your dose. For example: if you normally take four (4) pills per day, you will be asked to drop this dose to three (3) pills per day for seven (7) days, then to two (2) pills per day for seven (7) days, then to one (1) per day for seven (7) days, and at the end of those last seven (7) days, this is when the "Drug Holiday" would start.   Will I have withdrawals? By doing a "slow downward taper" like this one, it is unlikely that you will experience any significant withdrawal symptoms. Typically, what triggers withdrawals is the sudden stop of a high dose  opioid therapy. Withdrawals can usually be avoided by slowly decreasing the dose over a prolonged period of time. If you do not follow these instructions and decide to stop your medication abruptly, withdrawals may be possible.  What are withdrawals? Withdrawals: refers to the wide range of symptoms that occur after stopping or dramatically reducing opiate drugs after heavy and prolonged use. Withdrawal symptoms do not occur to patients that use low dose opioids, or those who take the medication sporadically. Contrary to benzodiazepine (example: Valium, Xanax, etc.) or alcohol withdrawals ("Delirium Tremens"), opioid withdrawals are not lethal. Withdrawals are the physical manifestation of the body getting rid of the excess receptors.  Expected Symptoms Early symptoms of withdrawal may include: Agitation Anxiety Muscle aches Increased tearing Insomnia Runny nose Sweating Yawning  Late symptoms of withdrawal may include: Abdominal cramping Diarrhea Dilated pupils Goose bumps Nausea Vomiting  Will I experience withdrawals? Due to the slow nature of the taper, it is very unlikely that you will experience any.  What is a slow taper? Taper: refers to the gradual decrease in dose.  (Last update: 08/26/2019) ____________________________________________________________________________________________   _______________________________________________________________________  Medication Rules  Purpose: To inform patients, and their family members, of our medication rules and regulations.  Applies to: All patients receiving prescriptions from our practice (written or electronic).  Pharmacy of record: This is the pharmacy where your electronic prescriptions will be sent. Make sure we have the correct one.  Electronic prescriptions: In compliance with the [place] (STOP) Act of 2017 (Session Law  3094-07/W808), effective February 05, 2018, all  controlled substances must be electronically prescribed. Written prescriptions, faxing, or calling prescriptions to a pharmacy will no longer be done.  Prescription refills: These will be provided only during in-person appointments. No medications will be renewed without a "face-to-face" evaluation with your provider. Applies to all prescriptions.  NOTE: The following applies primarily to controlled substances (Opioid* Pain Medications).   Type of encounter (visit): For patients receiving controlled substances, face-to-face visits are required. (Not an option and not up to the patient.)  Patient's responsibilities: Pain Pills: Bring all pain pills to every appointment (except for procedure appointments). Pill Bottles: Bring pills in original pharmacy bottle. Bring bottle, even if empty. Always bring the bottle of the most recent fill.  Medication refills: You are responsible for knowing and keeping track of what medications you are taking and when is it that you will need a refill. The day before your appointment: write a list of all prescriptions that need to be refilled. The day of the appointment: give the list to the admitting nurse. Prescriptions will be written only during appointments. No prescriptions will be written on procedure days. If you forget a medication: it will not be "Called in", "Faxed", or "electronically sent". You will need to get another appointment to get these prescribed. No early refills. Do not call asking to have your prescription filled early. Partial  or short prescriptions: Occasionally your pharmacy may not have enough pills to fill your prescription.  NEVER ACCEPT a partial fill or a prescription that is short of the total amount of pills that you were prescribed.  With controlled substances the law allows 72 hours for the pharmacy to complete the prescription.  If the prescription is not completed within 72 hours, the pharmacist will require a new prescription to be  written. This means that you will be short on your medicine and we WILL NOT send another prescription to complete your original prescription.  Instead, request the pharmacy to send a carrier to a nearby branch to get enough medication to provide you with your full prescription. Prescription Accuracy: You are responsible for carefully inspecting your prescriptions before leaving our office. Have the discharge nurse carefully go over each prescription with you, before taking them home. Make sure that your name is accurately spelled, that your address is correct. Check the name and dose of your medication to make sure it is accurate. Check the number of pills, and the written instructions to make sure they are clear and accurate. Make sure that you are given enough medication to last until your next medication refill appointment. Taking Medication: Take medication as prescribed. When it comes to controlled substances, taking less pills or less frequently than prescribed is permitted and encouraged. Never take more pills than instructed. Never take the medication more frequently than prescribed.  Inform other Doctors: Always inform, all of your healthcare providers, of all the medications you take. Pain Medication from other Providers: You are not allowed to accept any additional pain medication from any other Doctor or Healthcare provider. There are two exceptions to this rule. (see below) In the event that you require additional pain medication, you are responsible for notifying us, as stated below. Cough Medicine: Often these contain an opioid, such as codeine or hydrocodone. Never accept or take cough medicine containing these opioids if you are already taking an opioid* medication. The combination may cause respiratory failure and death. Medication Agreement: You are responsible for carefully reading and following our  Medication Agreement. This must be signed before receiving any prescriptions from our  practice. Safely store a copy of your signed Agreement. Violations to the Agreement will result in no further prescriptions. (Additional copies of our Medication Agreement are available upon request.) Laws, Rules, & Regulations: All patients are expected to follow all Federal and [employer], [employer], Rules, [employer] [facility]. Ignorance of the Laws does not constitute a valid excuse.  Illegal drugs and Controlled Substances: The use of illegal substances (including, but not limited to marijuana and its derivatives) and/or the illegal use of any controlled substances is strictly prohibited. Violation of this rule may result in the immediate and permanent discontinuation of any and all prescriptions being written by our practice. The use of any illegal substances is prohibited. Adopted CDC guidelines & recommendations: Target dosing levels will be at or below 60 MME/day. Use of benzodiazepines** is not recommended.  Exceptions: There are only two exceptions to the rule of not receiving pain medications from other Healthcare Providers. Exception #1 (Emergencies): In the event of an emergency (i.e.: accident requiring emergency care), you are allowed to receive additional pain medication. However, you are responsible for: As soon as you are able, call our office [phone], at any time of the day or night, and leave a message stating your name, the date and nature of the emergency, and the name and dose of the medication prescribed. In the event that your call is answered by a member of our staff, make sure to document and save the date, time, and the name of the person that took your information.  Exception #2 (Planned Surgery): In the event that you are scheduled by another doctor or dentist to have any type of surgery or procedure, you are allowed (for a period no longer than 30 days), to receive additional pain medication, for the acute post-op pain. However, in this case, you are responsible for picking up a  copy of our "Post-op Pain Management for Surgeons" handout, and giving it to your surgeon or dentist. This document is available at our office, and does not require an appointment to obtain it. Simply go to our office during business hours (Monday-Thursday from 8:00 AM to 4:00 PM) (Friday 8:00 AM to 12:00 Noon) or if you have a scheduled appointment with [place], prior to your surgery, and ask for it by name. In addition, you are responsible for: calling our office [phone], at any time of the day or night, and leaving a message stating your name, name of your surgeon, type of surgery, and date of procedure or surgery. Failure to comply with your responsibilities may result in termination of therapy involving the controlled substances. Medication Agreement Violation. Following the above rules, including your responsibilities will help you in avoiding a Medication Agreement Violation ("Breaking your Pain Medication Contract").  Consequences:  Not following the above rules may result in permanent discontinuation of medication prescription therapy.  *Opioid medications include: morphine, codeine, oxycodone, oxymorphone, hydrocodone, hydromorphone, meperidine, tramadol, tapentadol, buprenorphine, fentanyl, methadone. **Benzodiazepine medications include: diazepam (Valium), alprazolam (Xanax), clonazepam (Klonopine), lorazepam (Ativan), clorazepate (Tranxene), chlordiazepoxide (Librium), estazolam (Prosom), oxazepam (Serax), temazepam (Restoril), triazolam (Halcion) (Last updated: 11/28/2021) ______________________________________________________________________   ______________________________________________________________________  Medication Recommendations and Reminders  Applies to: All patients receiving prescriptions (written and/or electronic).  Medication Rules & Regulations: You are responsible for reading, knowing, and following our "Medication Rules" document. These exist for your safety and  that of others. They are not flexible and neither are we. Dismissing or ignoring  them is an act of "non-compliance" that may result in complete and irreversible termination of such medication therapy. For safety reasons, "non-compliance" will not be tolerated. As with the [place] fundamental legal principle of "ignorance of the law is no defense", we will accept no excuses for not having read and knowing the content of documents provided to you by our practice.  Pharmacy of record:  Definition: This is the pharmacy where your electronic prescriptions will be sent.  We do not endorse any particular pharmacy. It is up to you and your insurance to decide what pharmacy to use.  We do not restrict you in your choice of pharmacy. However, once we write for your prescriptions, we will NOT be re-sending more prescriptions to fix restricted supply problems created by your pharmacy, or your insurance.  The pharmacy listed in the electronic medical record should be the one where you want electronic prescriptions to be sent. If you choose to change pharmacy, simply notify our nursing staff. Changes will be made only during your regular appointments and not over the phone.  Recommendations: Keep all of your pain medications in a safe place, under lock and key, even if you live alone. We will NOT replace lost, stolen, or damaged medication. We do not accept "Police Reports" as proof of medications having been stolen. After you fill your prescription, take 1 week's worth of pills and put them away in a safe place. You should keep a separate, properly labeled bottle for this purpose. The remainder should be kept in the original bottle. Use this as your primary supply, until it runs out. Once it's gone, then you know that you have 1 week's worth of medicine, and it is time to come in for a prescription refill. If you do this correctly, it is unlikely that you will ever run out of medicine. To make sure that the above  recommendation works, it is very important that you make sure your medication refill appointments are scheduled at least 1 week before you run out of medicine. To do this in an effective manner, make sure that you do not leave the office without scheduling your next medication management appointment. Always ask the nursing staff to show you in your prescription , when your medication will be running out. Then arrange for the receptionist to get you a return appointment, at least 7 days before you run out of medicine. Do not wait until you have 1 or 2 pills left, to come in. This is very poor planning and does not take into consideration that we may need to cancel appointments due to bad weather, sickness, or emergencies affecting our staff. DO NOT ACCEPT A "Partial Fill": If for any reason your pharmacy does not have enough pills/tablets to completely fill or refill your prescription, do not allow for a "partial fill". The law allows the pharmacy to complete that prescription within 72 hours, without requiring a new prescription. If they do not fill the rest of your prescription within those 72 hours, you will need a separate prescription to fill the remaining amount, which we will NOT provide. If the reason for the partial fill is your insurance, you will need to talk to the pharmacist about payment alternatives for the remaining tablets, but again, DO NOT ACCEPT A PARTIAL FILL, unless you can trust your pharmacist to obtain the remainder of the pills within 72 hours.  Prescription refills and/or changes in medication(s):  Prescription refills, and/or changes in dose or medication, will be  conducted only during scheduled medication management appointments. (Applies to both, written and electronic prescriptions.) No refills on procedure days. No medication will be changed or started on procedure days. No changes, adjustments, and/or refills will be conducted on a procedure day. Doing so will interfere with the  diagnostic portion of the procedure. No phone refills. No medications will be "called into the pharmacy". No Fax refills. No weekend refills. No Holliday refills. No after hours refills.  Remember:  Business hours are:  Monday to Thursday 8:00 AM to 4:00 PM Provider's Schedule: Milinda Pointer, MD - Appointments are:  Medication management: Monday and Wednesday 8:00 AM to 4:00 PM Procedure day: Tuesday and Thursday 7:30 AM to 4:00 PM Gillis Santa, MD - Appointments are:  Medication management: Tuesday and Thursday 8:00 AM to 4:00 PM Procedure day: Monday and Wednesday 7:30 AM to 4:00 PM (Last update: 11/28/2021) ______________________________________________________________________  ____________________________________________________________________________________________  Pharmacy Shortages of Pain Medication   Introduction Shockingly as it may seem, .  "No U.[place] Supreme Court decision has ever interpreted the Constitution as guaranteeing a right to health care for all Americans." - https://huff.com/  "With respect to human rights, the [place] has no formally codified right to health, nor does it participate in a human rights treaty that specifies a right to health." - Scott J. Schweikart, JD, MBE  Situation By now, most of our patients have had the experience of being told by their pharmacist that they do not have enough medication to cover their prescription. If you have not had this experience, just know that you soon will.  Problem There appears to be a shortage of these medications, either at the national level or locally. This is happening with all pharmacies. When there is not enough medication, patients are offered a partial fill and they are told that they will try to get the rest of the medicine for them at a later time. If they do not have enough for even a partial fill, the pharmacists are  telling the patients to call us (the prescribing physicians) to request that we send another prescription to another pharmacy to get the medicine.   This reordering of a controlled substance creates documentation problems where additional paperwork needs to be created to explain why two prescriptions for the same period of time and the same medicine are being prescribed to the same patient. It also creates situations where the last appointment note does not accurately reflect when and what prescriptions were given to a patient. This leads to prescribing errors down the line, in subsequent follow-up visits.   [employer] of Pharmacy ([employer]) Research revealed that Surveyor, quantity .1806 (21 NCAC 46.1806) authorizes pharmacists to the transfer of prescriptions among pharmacies, and it sets forth procedural and recordkeeping requirements for doing so. However, this requires the pharmacist to complete the previously mentioned procedural paperwork to accomplish the transfer. As it turns out, it is much easier for them to have the prescribing physicians do the work.   Possible solutions 1. You can ask your physician to assist you in weaning yourself off these medications. 2. Ask your pharmacy if the medication is in stock, 3 days prior to your refill. 3. If you need a pharmacy change, let us know at your medication management visit. Prescriptions that have already been electronically sent to a pharmacy will not be re-sent to a different pharmacy if your pharmacy of record does not have it in stock. Proper stocking of medication is a pharmacy problem,  not a prescriber problem. Work with your pharmacist to solve the problem. 4. Have the [facility] Assembly add a provision to the "STOP ACT" (the law that mandates how controlled substances are prescribed) where there is an exception to the electronic prescribing rule that states that in the event there are shortages of medications the  physicians are allowed to use written prescriptions as opposed to electronic ones. This would allow patients to take their prescriptions to a different pharmacy that may have enough medication available to fill the prescription. The problem is that currently there is a law that does not allow for written prescriptions, with the exception of instances where the electronic medical record is down due to technical issues.  5. Have [place] Congress ease the pressure on pharmaceutical companies, allowing them to produce enough quantities of the medication to adequately supply the population. 6. Have pharmacies keep enough stocks of these medications to cover their client base.  7. Have the [facility] Assembly add a provision to the "STOP ACT" where they ease the regulations surrounding the transfer of controlled substances between pharmacies, so as to simplify the transfer of supplies. As an alternative, develop a system to allow patients to obtain the remainder of their prescription at another one of their pharmacies or at an associate pharmacy.   How this shortage will affect you.  Understand that this is a pharmacy supply problem, not a prescriber problem. Work with your pharmacy to solve it. The job of the prescriber is to evaluate and monitor the patient for the appropriate indications and use of these medicines. It is not the job of the prescriber to supply the medication or to solve problems with that supply. The responsibility and the choice to obtain the medication resides on the patient. By law, supplying the medication is the job of the pharmacy. It is certainly not the job of the prescriber to solve supply problems.   Due to the above problems we are no longer taking patients to write for their pain medication. Future discussions with your physician may include potentially weaning medications or transitioning to alternatives.  We will be focusing primarily on interventional based pain management.  We will continue to evaluate for appropriate indications and we may provide recommendations regarding medication, dose, and schedule, as well as monitoring recommendations, however, we will not be taking over the actual prescribing of these substances. On those patients where we are treating their chronic pain with interventional therapies, exceptions will be considered on a case by case basis. At this time, we will try to continue providing this supplemental service to those patients we have been managing in the past. However, as of August 1st, 2023, we no longer will be sending additional prescriptions to other pharmacies for the purpose of solving their supply problems. Once we send a prescription to a pharmacy, we will not be resending it again to another pharmacy to cover for their shortages.   What to do. Write as many letters as you can. Recruit the help of family members in writing these letters. Below are some of the places where you can write to make your voice heard. Let them know what the problem is and push them to look for solutions.   Search internet for: "[employer] find your legislators" NoseSwap.is  Search internet for: "[employer] commissioner complaints" Starlas.fi  Search internet for: "[place] complaints" https://www.hernandez-brewer.com/.htm  Search internet for: "[facility] pharmacy complaints" Email [facility] Customer Relations  woondaal.com.jsp?callType=store  Search internet for: Programme researcher, broadcasting/film/video customer service complaints" https://www.[facility].com/topic/marketing/contactus/contactus_customerservice.jsp  ____________________________________________________________________________________________  ____________________________________________________________________________________________  CBD  (cannabidiol) & Delta-8 (Delta-8 tetrahydrocannabinol) WARNING  Intro: Cannabidiol (CBD) and tetrahydrocannabinol (THC), are two natural compounds found in plants of the Cannabis genus. They can both be extracted from hemp or cannabis. Hemp and cannabis come from the Cannabis sativa plant. Both compounds interact with your body's endocannabinoid system, but they have very different effects. CBD does not produce the high sensation associated with cannabis. Delta-8 tetrahydrocannabinol, also known as delta-8 THC, is a psychoactive substance found in the Cannabis sativa plant, of which marijuana and hemp are two varieties. THC is responsible for the high associated with the illicit use of marijuana.  Applicable to: All individuals currently taking or considering taking CBD (cannabidiol) and, more important, all patients taking opioid analgesic controlled substances (pain medication). (Example: oxycodone; oxymorphone; hydrocodone; hydromorphone; morphine; methadone; tramadol; tapentadol; fentanyl; buprenorphine; butorphanol; dextromethorphan; meperidine; codeine; etc.)  Legal status: CBD remains a Schedule I drug prohibited for any use. CBD is illegal with one exception. In the [place], CBD has a limited Transport planner (FDA) approval for the treatment of two specific types of epilepsy disorders. Only one CBD product has been approved by the FDA for this purpose: "Epidiolex". FDA is aware that some companies are marketing products containing cannabis and cannabis-derived compounds in ways that violate the [employer], Drug and Cosmetic Act [facility] Act) and that may put the health and safety of consumers at risk. The FDA, a Federal agency, has not enforced the CBD status since 2018. UPDATE: (03/24/2021) The Drug Enforcement Agency ([place]) issued a letter stating that "delta" cannabinoids, including Delta-8-THCO and Delta-9-THCO, synthetically derived from hemp do not qualify as hemp and will be  viewed as Schedule I drugs. (Schedule I drugs, substances, or chemicals are defined as drugs with no currently accepted medical use and a high potential for abuse. Some examples of Schedule I drugs are: heroin, lysergic acid diethylamide (LSD), marijuana (cannabis), 3,4-methylenedioxymethamphetamine (ecstasy), methaqualone, and peyote.) (https://jennings.com/)  Legality: Some manufacturers ship CBD products nationally, which is illegal. Often such products are sold online and are therefore available throughout the country. CBD is openly sold in head shops and health food stores in some states where such sales have not been explicitly legalized. Selling unapproved products with unsubstantiated therapeutic claims is not only a violation of the law, but also can put patients at risk, as these products have not been proven to be safe or effective. Federal illegality makes it difficult to conduct research on CBD.  Reference: "FDA Regulation of Cannabis and Cannabis-Derived Products, Including Cannabidiol (CBD)" - [employer].pt  Warning: CBD is not FDA approved and has not undergo the same manufacturing controls as prescription drugs.  This means that the purity and safety of available CBD may be questionable. Most of the time, despite manufacturer's claims, it is contaminated with THC (delta-9-tetrahydrocannabinol - the chemical in marijuana responsible for the "HIGH").  When this is the case, the [facility] contaminant will trigger a positive urine drug screen (UDS) test for Marijuana (carboxy-THC). Because a positive UDS for any illicit substance is a violation of our medication agreement, your opioid analgesics (pain medicine) may be permanently discontinued. The FDA recently put out a warning about 5 things that everyone should be aware of regarding Delta-8 THC: Delta-8 THC products have not been  evaluated or approved by the FDA for safe use and may be marketed in ways that put the public health at risk.  The FDA has received adverse event reports involving delta-8 THC-containing products. Delta-8 THC has psychoactive and intoxicating effects. Delta-8 THC manufacturing often involve use of potentially harmful chemicals to create the concentrations of delta-8 THC claimed in the marketplace. The final delta-8 THC product may have potentially harmful by-products (contaminants) due to the chemicals used in the process. Manufacturing of delta-8 THC products may occur in uncontrolled or unsanitary settings, which may lead to the presence of unsafe contaminants or other potentially harmful substances. Delta-8 THC products should be kept out of the reach of children and pets.  MORE ABOUT CBD  General Information: CBD was discovered in [birth year] and it is a derivative of the cannabis sativa genus plants (Marijuana and Hemp). It is one of the 113 identified substances found in Marijuana. It accounts for up to 40% of the plant's extract. As of 2018, preliminary clinical studies on CBD included research for the treatment of anxiety, movement disorders, and pain. CBD is available and consumed in multiple forms, including inhalation of smoke or vapor, as an aerosol spray, and by mouth. It may be supplied as an oil containing CBD, capsules, dried cannabis, or as a liquid solution. CBD is thought not to be as psychoactive as THC (delta-9-tetrahydrocannabinol - the chemical in marijuana responsible for the "HIGH"). Studies suggest that CBD may interact with different biological target receptors in the body, including cannabinoid and other neurotransmitter receptors. As of 2018 the mechanism of action for its biological effects has not been determined.  Side-effects  Adverse reactions: Dry mouth, diarrhea, decreased appetite, fatigue, drowsiness, malaise, weakness, sleep disturbances, and others.  Drug interactions:  CBC may interact with other medications such as blood-thinners. Because CBD causes drowsiness on its own, it also increases the drowsiness caused by other medications, including antihistamines (such as Benadryl), benzodiazepines (Xanax, Ativan, Valium), antipsychotics, antidepressants and opioids, as well as alcohol and supplements such as kava, melatonin and St. John's Wort. Be cautious with the following combinations:   Brivaracetam (Briviact) Brivaracetam is changed and broken down by the body. CBD might decrease how quickly the body breaks down brivaracetam. This might increase levels of brivaracetam in the body.  Caffeine Caffeine is changed and broken down by the body. CBD might decrease how quickly the body breaks down caffeine. This might increase levels of caffeine in the body.  Carbamazepine (Tegretol) Carbamazepine is changed and broken down by the body. CBD might decrease how quickly the body breaks down carbamazepine. This might increase levels of carbamazepine in the body and increase its side effects.  Citalopram (Celexa) Citalopram is changed and broken down by the body. CBD might decrease how quickly the body breaks down citalopram. This might increase levels of citalopram in the body and increase its side effects.  Clobazam (Onfi) Clobazam is changed and broken down by the liver. CBD might decrease how quickly the liver breaks down clobazam. This might increase the effects and side effects of clobazam.  Eslicarbazepine (Aptiom) Eslicarbazepine is changed and broken down by the body. CBD might decrease how quickly the body breaks down eslicarbazepine. This might increase levels of eslicarbazepine in the body by a small amount.  Everolimus (Zostress) Everolimus is changed and broken down by the body. CBD might decrease how quickly the body breaks down everolimus. This might increase levels of everolimus in the body.  Lithium Taking higher doses of CBD might increase levels of  lithium. This can increase the risk of lithium toxicity.  Medications changed by the liver (Cytochrome P450 1A1 (CYP1A1)  substrates) Some medications are changed and broken down by the liver. CBD might change how quickly the liver breaks down these medications. This could change the effects and side effects of these medications.  Medications changed by the liver (Cytochrome P450 1A2 (CYP1A2) substrates) Some medications are changed and broken down by the liver. CBD might change how quickly the liver breaks down these medications. This could change the effects and side effects of these medications.  Medications changed by the liver (Cytochrome P450 1B1 (CYP1B1) substrates) Some medications are changed and broken down by the liver. CBD might change how quickly the liver breaks down these medications. This could change the effects and side effects of these medications.  Medications changed by the liver (Cytochrome P450 2A6 (CYP2A6) substrates) Some medications are changed and broken down by the liver. CBD might change how quickly the liver breaks down these medications. This could change the effects and side effects of these medications.  Medications changed by the liver (Cytochrome P450 2B6 (CYP2B6) substrates) Some medications are changed and broken down by the liver. CBD might change how quickly the liver breaks down these medications. This could change the effects and side effects of these medications.  Medications changed by the liver (Cytochrome P450 2C19 (CYP2C19) substrates) Some medications are changed and broken down by the liver. CBD might change how quickly the liver breaks down these medications. This could change the effects and side effects of these medications.  Medications changed by the liver (Cytochrome P450 2C8 (CYP2C8) substrates) Some medications are changed and broken down by the liver. CBD might change how quickly the liver breaks down these medications. This could change  the effects and side effects of these medications.  Medications changed by the liver (Cytochrome P450 2C9 (CYP2C9) substrates) Some medications are changed and broken down by the liver. CBD might change how quickly the liver breaks down these medications. This could change the effects and side effects of these medications.  Medications changed by the liver (Cytochrome P450 2D6 (CYP2D6) substrates) Some medications are changed and broken down by the liver. CBD might change how quickly the liver breaks down these medications. This could change the effects and side effects of these medications.  Medications changed by the liver (Cytochrome P450 2E1 (CYP2E1) substrates) Some medications are changed and broken down by the liver. CBD might change how quickly the liver breaks down these medications. This could change the effects and side effects of these medications.  Medications changed by the liver (Cytochrome P450 3A4 (CYP3A4) substrates) Some medications are changed and broken down by the liver. CBD might change how quickly the liver breaks down these medications. This could change the effects and side effects of these medications.  Medications changed by the liver (Glucuronidated drugs) Some medications are changed and broken down by the liver. CBD might change how quickly the liver breaks down these medications. This could change the effects and side effects of these medications.  Medications that decrease the breakdown of other medications by the liver (Cytochrome P450 2C19 (CYP2C19) inhibitors) CBD is changed and broken down by the liver. Some drugs decrease how quickly the liver changes and breaks down CBD. This could change the effects and side effects of CBD.  Medications that decrease the breakdown of other medications in the liver (Cytochrome P450 3A4 (CYP3A4) inhibitors) CBD is changed and broken down by the liver. Some drugs decrease how quickly the liver changes and breaks down CBD.  This could change the effects and side effects of  CBD.  Medications that increase breakdown of other medications by the liver (Cytochrome P450 3A4 (CYP3A4) inducers) CBD is changed and broken down by the liver. Some drugs increase how quickly the liver changes and breaks down CBD. This could change the effects and side effects of CBD.  Medications that increase the breakdown of other medications by the liver (Cytochrome P450 2C19 (CYP2C19) inducers) CBD is changed and broken down by the liver. Some drugs increase how quickly the liver changes and breaks down CBD. This could change the effects and side effects of CBD.  Methadone (Dolophine) Methadone is broken down by the liver. CBD might decrease how quickly the liver breaks down methadone. Taking cannabidiol along with methadone might increase the effects and side effects of methadone.  Rufinamide (Banzel) Rufinamide is changed and broken down by the body. CBD might decrease how quickly the body breaks down rufinamide. This might increase levels of rufinamide in the body by a small amount.  Sedative medications (CNS depressants) CBD might cause sleepiness and slowed breathing. Some medications, called sedatives, can also cause sleepiness and slowed breathing. Taking CBD with sedative medications might cause breathing problems and/or too much sleepiness.  Sirolimus (Rapamune) Sirolimus is changed and broken down by the body. CBD might decrease how quickly the body breaks down sirolimus. This might increase levels of sirolimus in the body.  Stiripentol (Diacomit) Stiripentol is changed and broken down by the body. CBD might decrease how quickly the body breaks down stiripentol. This might increase levels of stiripentol in the body and increase its side effects.  Tacrolimus (Prograf) Tacrolimus is changed and broken down by the body. CBD might decrease how quickly the body breaks down tacrolimus. This might increase levels of tacrolimus in the  body.  Tamoxifen (Soltamox) Tamoxifen is changed and broken down by the body. CBD might affect how quickly the body breaks down tamoxifen. This might affect levels of tamoxifen in the body.  Topiramate (Topamax) Topiramate is changed and broken down by the body. CBD might decrease how quickly the body breaks down topiramate. This might increase levels of topiramate in the body by a small amount.  Valproate Valproic acid can cause liver injury. Taking cannabidiol with valproic acid might increase the chance of liver injury. CBD and/or valproic acid might need to be stopped, or the dose might need to be reduced.  Warfarin (Coumadin) CBD might increase levels of warfarin, which can increase the risk for bleeding. CBD and/or warfarin might need to be stopped, or the dose might need to be reduced.  Zonisamide Zonisamide is changed and broken down by the body. CBD might decrease how quickly the body breaks down zonisamide. This might increase levels of zonisamide in the body by a small amount. (Last update: 04/05/2021) ____________________________________________________________________________________________  ____________________________________________________________________________________________  Patient Information update  To: All of our patients.  Re: Name change.  It has been made official that our current name, "[facility]"   will soon be changed to "[facility]".   The purpose of this change is to eliminate any confusion created by the concept of our practice being a "Medication Management Pain Clinic". In the past this has led to the misconception that we treat pain primarily by the use of prescription medications.  Nothing can be farther from the truth.   Understanding PAIN MANAGEMENT: To further understand what our practice does, you first have to understand that "Pain  Management" is a subspecialty that  requires additional training once a physician has completed their specialty training, which can be in either Anesthesia, Neurology, Psychiatry, or Physical Medicine and Rehabilitation (PMR). Each one of these contributes to the final approach taken by each physician to the management of their patient's pain. To be a "Pain Management Specialist" you must have first completed one of the specialty trainings below.  Anesthesiologists - trained in clinical pharmacology and interventional techniques such as nerve blockade and regional as well as central neuroanatomy. They are trained to block pain before, during, and after surgical interventions.  Neurologists - trained in the diagnosis and pharmacological treatment of complex neurological conditions, such as Multiple Sclerosis, Parkinson's, spinal cord injuries, and other systemic conditions that may be associated with symptoms that may include but are not limited to pain. They tend to rely primarily on the treatment of chronic pain using prescription medications.  Psychiatrist - trained in conditions affecting the psychosocial wellbeing of patients including but not limited to depression, anxiety, schizophrenia, personality disorders, addiction, and other substance use disorders that may be associated with chronic pain. They tend to rely primarily on the treatment of chronic pain using prescription medications.   Physical Medicine and Rehabilitation (PMR) physicians, also known as physiatrists - trained to treat a wide variety of medical conditions affecting the brain, spinal cord, nerves, bones, joints, ligaments, muscles, and tendons. Their training is primarily aimed at treating patients that have suffered injuries that have caused severe physical impairment. Their training is primarily aimed at the physical therapy and rehabilitation of those patients. They may also work alongside orthopedic surgeons or neurosurgeons using  their expertise in assisting surgical patients to recover after their surgeries.  INTERVENTIONAL PAIN MANAGEMENT is sub-subspecialty of Pain Management.  Our physicians are Board-certified in Anesthesia, Pain Management, and Interventional Pain Management.  This meaning that not only have they been trained and Board-certified in their specialty of Anesthesia, and subspecialty of Pain Management, but they have also received further training in the sub-subspecialty of Interventional Pain Management, in order to become Board-certified as INTERVENTIONAL PAIN MANAGEMENT SPECIALIST.    Mission: Our goal is to use our skills in  [place] as alternatives to the chronic use of prescription opioid medications for the treatment of pain. To make this more clear, we have changed our name to reflect what we do and offer. We will continue to offer medication management assessment and recommendations, but we will not be taking over any patient's medication management.  ____________________________________________________________________________________________

## 2022-01-02 ENCOUNTER — Other Ambulatory Visit (INDEPENDENT_AMBULATORY_CARE_PROVIDER_SITE_OTHER): Payer: Self-pay | Admitting: Nurse Practitioner

## 2022-01-02 DIAGNOSIS — Z9889 Other specified postprocedural states: Secondary | ICD-10-CM

## 2022-01-02 DIAGNOSIS — I872 Venous insufficiency (chronic) (peripheral): Secondary | ICD-10-CM

## 2022-01-03 ENCOUNTER — Ambulatory Visit (INDEPENDENT_AMBULATORY_CARE_PROVIDER_SITE_OTHER): Payer: Medicare Other | Admitting: Nurse Practitioner

## 2022-01-03 ENCOUNTER — Ambulatory Visit (INDEPENDENT_AMBULATORY_CARE_PROVIDER_SITE_OTHER): Payer: Medicare Other

## 2022-01-03 ENCOUNTER — Encounter (INDEPENDENT_AMBULATORY_CARE_PROVIDER_SITE_OTHER): Payer: Self-pay | Admitting: Nurse Practitioner

## 2022-01-03 VITALS — BP 128/74 | HR 65 | Resp 16 | Wt 174.0 lb

## 2022-01-03 DIAGNOSIS — I872 Venous insufficiency (chronic) (peripheral): Secondary | ICD-10-CM

## 2022-01-03 DIAGNOSIS — Z9889 Other specified postprocedural states: Secondary | ICD-10-CM

## 2022-01-03 DIAGNOSIS — I890 Lymphedema, not elsewhere classified: Secondary | ICD-10-CM

## 2022-01-14 ENCOUNTER — Encounter (INDEPENDENT_AMBULATORY_CARE_PROVIDER_SITE_OTHER): Payer: Self-pay | Admitting: Nurse Practitioner

## 2022-01-14 NOTE — Progress Notes (Signed)
Subjective:    Patient ID: Maria Keller, female    DOB: Jan 11, 1951, 71 y.o.   MRN: [id number] Chief Complaint  Patient presents with   Follow-up    Ultrasound follow up    Maria Keller is a 71 year old female who returns today following recent right knee replacement surgery.  Initially the patient did have swelling however she notes that the swelling has improved significantly and it is actually resolved at this time.  She denies that she has been having any issues of swelling.  There has been no redness or signs symptoms of DVT.  Today no evidence of DVT in the right lower extremity with no evidence of superficial thrombophlebitis.  No cystic structure found in the popliteal fossa.  There is also no evidence of left common femoral vein obstruction.    Review of Systems  Cardiovascular:  Negative for leg swelling.  All other systems reviewed and are negative.      Objective:   Physical Exam Vitals reviewed.  HENT:     Head: Normocephalic.  Cardiovascular:     Rate and Rhythm: Normal rate.     Pulses: Normal pulses.  Pulmonary:     Effort: Pulmonary effort is normal.  Skin:    General: Skin is warm and dry.  Neurological:     Mental Status: She is alert and oriented to person, place, and time.  Psychiatric:        Mood and Affect: Mood normal.        Behavior: Behavior normal.        Thought Content: Thought content normal.        Judgment: Judgment normal.     BP 128/74 (BP Location: Left Arm)   Pulse 65   Resp 16   Wt 174 lb (78.9 kg)   BMI 27.25 kg/m   Past Medical History:  Diagnosis Date   Anemia    iron deficiency and B12 deficiency   Arthritis    Bronchitis 08/2015   Chronic back pain    followed by Dr Primus Bravo   Depression    Diverticulosis    Esophageal stricture    requiring dilatation x 2   Fibromyalgia    Fibromyalgia    Fibromyalgia    Follicular lymphoma (HCC)    Followed by Dr Leretha Pol, s/p chemo and XRT   Follicular lymphoma ([place])     GERD (gastroesophageal reflux disease)    GERD (gastroesophageal reflux disease)    Hemorrhoid    Hiatal hernia    History of kidney stones    Hypercholesterolemia    IBS (irritable bowel syndrome)    IBS (irritable bowel syndrome)    Nephrolithiasis    followed by Dr Bernardo Heater, s/p stents   PONV (postoperative nausea and vomiting)    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   Shingles outbreak 11/26/2011   Sleep apnea     Social History   Socioeconomic History   Marital status: Married    Spouse name: [name]   Number of children: Not on file   Years of education: Not on file   Highest education level: Not on file  Occupational History   Not on file  Tobacco Use   Smoking status: Never   Smokeless tobacco: Never  Vaping Use   Vaping Use: Never used  Substance and Sexual Activity   Alcohol use: No    Alcohol/week: 0.0 standard drinks of alcohol   Drug use: No  Sexual activity: Yes  Other Topics Concern   Not on file  Social History Narrative   Not on file   Social Determinants of Health   Financial Resource Strain: Low Risk  (08/23/2021)   Overall Financial Resource Strain (CARDIA)    Difficulty of Paying Living Expenses: Not hard at all  Food Insecurity: No Food Insecurity (11/07/2021)   Hunger Vital Sign    Worried About Running Out of Food in the Last Year: Never true    Ran Out of Food in the Last Year: Never true  Transportation Needs: No Transportation Needs (11/07/2021)   PRAPARE - Hydrologist (Medical): No    Lack of Transportation (Non-Medical): No  Physical Activity: Insufficiently Active (08/19/2018)   Exercise Vital Sign    Days of Exercise per Week: 1 day    Minutes of Exercise per Session: 30 min  Stress: No Stress Concern Present (08/23/2021)   [place]    Feeling of Stress : Not at all  Social Connections:  [place] (08/23/2021)   Social Connection and Isolation Panel [NHANES]    Frequency of Communication with Friends and Family: More than three times a week    Frequency of Social Gatherings with Friends and Family: More than three times a week    Attends Religious Services: More than 4 times per year    Active Member of Clubs or Organizations: Not on file    Attends Archivist Meetings: More than 4 times per year    Marital Status: Married  Human resources officer Violence: Not At Risk (11/07/2021)   Humiliation, Afraid, Rape, and Kick questionnaire    Fear of Current or Ex-Partner: No    Emotionally Abused: No    Physically Abused: No    Sexually Abused: No    Past Surgical History:  Procedure Laterality Date   APPENDECTOMY     BLADDER SURGERY  1993   BREAST BIOPSY N/A    capsule endoscopy     CHOLECYSTECTOMY  1995   COLONOSCOPY WITH PROPOFOL N/A 06/19/2017   Procedure: COLONOSCOPY WITH PROPOFOL;  Surgeon: Manya Silvas, MD;  Location: [facility] [place] ENDOSCOPY;  Service: Endoscopy;  Laterality: N/A;   ESOPHAGOGASTRODUODENOSCOPY (EGD) WITH PROPOFOL N/A 06/19/2017   Procedure: ESOPHAGOGASTRODUODENOSCOPY (EGD) WITH PROPOFOL;  Surgeon: Manya Silvas, MD;  Location: [facility] ENDOSCOPY;  Service: Endoscopy;  Laterality: N/A;   EYE SURGERY     both cataract remove   FRACTURE SURGERY Left    ankle, plate with 8 screws   history of helicobacter pylori infection     JOINT REPLACEMENT Right    knee   LEFT OOPHORECTOMY     LEG SURGERY Left    ORIF ANKLE FRACTURE  2012   OVARIAN CYST REMOVAL     right   RECTOCELE REPAIR N/A    REPLACEMENT TOTAL KNEE Right 03/19/2016   TONSILECTOMY/ADENOIDECTOMY WITH MYRINGOTOMY  1970   TONSILLECTOMY N/A    TOTAL KNEE REVISION Right 11/07/2021   Procedure: TOTAL KNEE REVISION;  Surgeon: Corky Mull, MD;  Location: [facility] ORS;  Service: Orthopedics;  Laterality: Right;   TUBAL LIGATION  1976   VAGINAL HYSTERECTOMY  1980's   secondary to bleeding     Family History  Problem Relation Age of Onset   Ulcers Mother    Ulcers Father    Breast cancer Other        great aunt and grandfather   Lung cancer Other  uncle   Hypertension Maternal Grandmother    Heart disease Maternal Grandmother        MI 105s   Diabetes Maternal Grandmother    Arthritis Maternal Grandmother    Diabetes Maternal Grandfather     Allergies  Allergen Reactions   Oxycodone Hcl Itching    Other reaction(s): Headache (finding)   Oxycontin [Oxycodone Hcl] Itching    Sleep Apnea   Penicillins Anaphylaxis and Itching   Chloral Hydrate Hives    Other Reaction: Not Assessed   Ciprofloxacin Itching   Ciprofloxacin Hcl Itching    She can take Levaquin   Colace [Docusate Calcium]    Methotrexate Derivatives Other (See Comments)    Questionable    Parafon Forte Dsc [Chlorzoxazone] Itching and Other (See Comments)    Other Reaction: Not Assessed   Penicillin G Hives   Tape Other (See Comments)    adhesive  tape cause redness and skin to peel   Bextra [Valdecoxib] Itching    Other reaction(s): Other (See Comments) Other Reaction: Not Assessed   Oxycodone Itching   Rofecoxib Itching    Other reaction(s): Headache (finding)       Latest Ref Rng & Units 11/08/2021    5:47 AM 10/16/2021   12:00 AM 02/28/2021    8:46 AM  CBC  WBC 4.0 - 10.5 K/uL 12.4  7.8     6.3   Hemoglobin 12.0 - 15.0 g/dL 10.6  12.9     12.3   Hematocrit 36.0 - 46.0 % 32.8  39     37.8   Platelets 150 - 400 K/uL 187  214     181.0      This result is from an external source.      CMP     Component Value Date/Time   NA 140 12/04/2021 0755   NA 142 10/16/2021 0000   NA 138 11/06/2012 1031   K 4.4 12/04/2021 0755   K 4.0 11/06/2012 1031   CL 104 12/04/2021 0755   CL 100 11/06/2012 1031   CO2 29 12/04/2021 0755   CO2 35 (H) 11/06/2012 1031   GLUCOSE 105 (H) 12/04/2021 0755   GLUCOSE 88 11/06/2012 1031   BUN 14 12/04/2021 0755   BUN 11 10/16/2021 0000   BUN 13  11/06/2012 1031   CREATININE 0.64 12/04/2021 0755   CREATININE 0.90 11/06/2012 1031   CALCIUM 8.7 12/04/2021 0755   CALCIUM 9.0 11/06/2012 1031   PROT 6.1 12/04/2021 0755   PROT 7.2 10/12/2021 1505   ALBUMIN 3.7 12/04/2021 0755   ALBUMIN 4.8 10/12/2021 1505   AST 27 12/04/2021 0755   ALT 14 12/04/2021 0755   ALKPHOS 102 12/04/2021 0755   BILITOT 0.3 12/04/2021 0755   BILITOT 0.7 10/12/2021 1505   GFRNONAA >60 11/08/2021 0547   GFRNONAA >60 11/06/2012 1031   GFRAA >60 11/06/2012 1031     No results found.     Assessment & Plan:   1. Chronic venous insufficiency Today noninvasive study showed no evidence of venous insufficiency bilaterally.  Her swelling is doing well post knee replacement. - VAS [place] LOWER EXTREMITY VENOUS (DVT)  2. Lymphedema Post knee replacement the patient swelling is doing well.  She denies any significant issues with the swelling or rehab.  Based on this the patient continue with conservative therapy including use of medical grade compression, elevation and activity.  Because her swelling is much improved we will have the patient follow-up on an as-needed basis.   Current  Outpatient Medications on File Prior to Visit  Medication Sig Dispense Refill   acetaminophen (TYLENOL) 650 MG CR tablet Take 650 mg by mouth every 8 (eight) hours as needed for pain.     aspirin EC 81 MG tablet Take 81 mg by mouth daily.     atorvastatin (LIPITOR) 10 MG tablet TAKE 1 TABLET BY MOUTH DAILY. 30 tablet 2   azelastine (ASTELIN) 0.1 % nasal spray 1 SPRAY IN EACH NOSTRIL TWICE DAILY AS DIRECTED 30 mL 1   calcium carbonate (OS-CAL) 1250 (500 Ca) MG chewable tablet Chew 1 tablet by mouth daily.     calcium carbonate (TUMS - DOSED IN MG ELEMENTAL CALCIUM) 500 MG chewable tablet Chew 1 tablet by mouth as needed.     cetirizine (ZYRTEC) 10 MG tablet Take by mouth.     cholecalciferol (VITAMIN D3) 25 MCG (1000 UNIT) tablet Take 1,000 Units by mouth daily.     estradiol (ESTRACE)  0.5 MG tablet TAKE 1 TABLET BY MOUTH DAILY. 90 tablet 1   fentaNYL (DURAGESIC) 25 MCG/HR Place 1 patch onto the skin every 3 (three) days for 15 days. 5 patch 0   fentaNYL (DURAGESIC) 50 MCG/HR Place 1 patch onto the skin every 3 (three) days for 15 days. 5 patch 0   fluticasone (FLONASE) 50 MCG/ACT nasal spray Place 2 sprays into both nostrils daily. 48 g 3   furosemide (LASIX) 20 MG tablet TAKE 1 TABLET BY MOUTH DAILY AS NEEDED. 90 tablet 0   Multiple Vitamin (MULTIVITAMIN) tablet Take 1 tablet by mouth daily.     mupirocin ointment (BACTROBAN) 2 % Apply 1 Application topically 2 (two) times daily. 22 g 0   naloxone (NARCAN) nasal spray 4 mg/0.1 mL Place 1 spray into the nose as needed for up to 365 doses (for opioid-induced respiratory depresssion). In case of emergency (overdose), spray once into each nostril. If no response within 3 minutes, repeat application and call [phone]. 1 each 0   omeprazole (PRILOSEC) 20 MG capsule TAKE 1 CAPSULE BY MOUTH TWO TIMES DAILY 180 capsule 2   ondansetron (ZOFRAN) 4 MG tablet Take 1 tablet (4 mg total) by mouth every 6 (six) hours as needed for nausea. 30 tablet 0   oxyCODONE (ROXICODONE) 5 MG immediate release tablet Take 1 tablet (5 mg total) by mouth every 6 (six) hours. 120 tablet 0   [START ON 02/03/2022] oxyCODONE (ROXICODONE) 5 MG immediate release tablet Take 1 tablet (5 mg total) by mouth in the morning, at noon, and at bedtime for 7 days, THEN 1 tablet (5 mg total) in the morning and at bedtime for 7 days, THEN 1 tablet (5 mg total) daily for 7 days. 42 tablet 0   predniSONE (DELTASONE) 10 MG tablet Take 4 tablets x 1 day and then decrease by 1/2 tablet per day until down to zero mg. 18 tablet 0   senna-docusate (SENOKOT-S) 8.6-50 MG per tablet Take by mouth daily.     No current facility-administered medications on file prior to visit.    There are no Patient Instructions on file for this visit. No follow-ups on file.   Kris Hartmann, NP

## 2022-01-31 ENCOUNTER — Encounter: Payer: Medicare Other | Admitting: Pain Medicine

## 2022-01-31 ENCOUNTER — Other Ambulatory Visit: Payer: Self-pay | Admitting: Internal Medicine

## 2022-02-09 DIAGNOSIS — T84038D Mechanical loosening of other internal prosthetic joint, subsequent encounter: Secondary | ICD-10-CM | POA: Diagnosis not present

## 2022-02-09 DIAGNOSIS — Z96651 Presence of right artificial knee joint: Secondary | ICD-10-CM | POA: Diagnosis not present

## 2022-02-09 DIAGNOSIS — Z96659 Presence of unspecified artificial knee joint: Secondary | ICD-10-CM | POA: Diagnosis not present

## 2022-02-11 NOTE — Progress Notes (Unsigned)
PROVIDER NOTE: Information contained herein reflects review and annotations entered in association with encounter. Interpretation of such information and data should be left to medically-trained personnel. Information provided to patient can be located elsewhere in the medical record under "Patient Instructions". Document created using STT-dictation technology, any transcriptional errors that may result from process are unintentional.    Patient: Maria Keller  Service Category: E/M  Provider: Gaspar Cola, MD  DOB: May 13, 1950  DOS: 02/14/2022  Referring Provider: Einar Pheasant, MD  MRN: [id number]  Specialty: Interventional Pain Management  PCP: Einar Pheasant, MD  Type: Established Patient  Setting: Ambulatory outpatient    Location: Office  Delivery: Face-to-face     HPI  [name], a 72 y.o. year old female, is here today because of her No primary diagnosis found.. Maria Keller primary complain today is No chief complaint on file. Last encounter: My last encounter with her was on 12/25/2021. Pertinent problems: Ms. 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, right; History of shingles; History of urinary stone; Intractable migraine with aura without status migrainosus; Myalgia and myositis; Osteoarthritis; Primary osteoarthritis of right knee; History of total knee replacement (Right); Lymphoma ([place]); Chronic pain syndrome; Chronic thigh pain (2ry area of  Pain) (Bilateral) (R>L); Chronic knee pain after total replacement (3ry area of Pain) (Right); Chronic shoulder pain (4th area of Pain) (Bilateral) (R>L); Status post revision of total knee replacement, right; Osteoarthritis of acromioclavicular joints (Bilateral); Tendinopathy of rotator cuff (Bilateral); Shoulder enthesopathy (Bilateral); Grade 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 Pain Assessment: Severity of   is reported as a  /10. Location:    / . Onset:  . Quality:  . Timing:  . Modifying factor(s):  Marland Kitchen Vitals:  vitals were not taken for this visit.  BMI: Estimated body mass index is 27.25 kg/m as calculated from the following:   Height as of 12/25/21: '5\' 7"'$  (1.702 m).   Weight as of 01/03/22: 174 lb (78.9 kg).  Reason for encounter: medication management. ***    Pharmacotherapy Assessment  Analgesic: Oxycodone IR 5 mg tablet, 1 tab p.o. 5 times daily (#150) (last filled on 09/20/2021) (37.5 MME/day); fentanyl 25 mcg/h patch every 48 hours (#15) (last filled on 09/15/2021) (90 MME/day) + fentanyl 100 mcg/h patch every 48 hours (# 15) (last filled on 09/12/2021) (360 MME/day). MME/day: 487.5 mg/day   Monitoring:  PMP: PDMP reviewed during this encounter.       Pharmacotherapy: No side-effects or adverse reactions reported. Compliance: No problems identified. Effectiveness: Clinically acceptable.  No notes on file  No results found for: "CBDTHCR" No results found for: "D8THCCBX" No results found for: "D9THCCBX"  UDS:  Summary  Date Value Ref Range Status  10/12/2021 Note  Final    Comment:    ==================================================================== Compliance Drug Analysis, Ur ==================================================================== Test  Result        Flag       Units  Drug Present and Declared for Prescription Verification   Oxycodone                      79           EXPECTED   ng/mg creat   Oxymorphone                    47           EXPECTED   ng/mg creat   Noroxycodone                   611          EXPECTED   ng/mg creat    Sources of oxycodone include scheduled prescription medications.    Oxymorphone and noroxycodone are expected metabolites of oxycodone.    Oxymorphone is also available as a scheduled prescription medication.    Fentanyl                       >231         EXPECTED   ng/mg creat   Norfentanyl                    >463         EXPECTED   ng/mg creat    Source of fentanyl is a scheduled prescription medication, including    IV, patch, and transmucosal formulations. Norfentanyl is an expected    metabolite of fentanyl.  Drug Present not Declared for Prescription Verification   Acetaminophen                  PRESENT      UNEXPECTED  Drug Absent but Declared for Prescription Verification   Salicylate                     Not Detected UNEXPECTED    Aspirin, as indicated in the declared medication list, is not always    detected even when used as directed.    Lidocaine                      Not Detected UNEXPECTED    Lidocaine, as indicated in the declared medication list, is not    always detected even when used as directed.  ==================================================================== Test                      Result    Flag   Units      Ref Range   Creatinine              216              mg/dL      >=20 ==================================================================== Declared Medications:  The flagging and interpretation on this report are based on the  following declared medications.  Unexpected results may arise from  inaccuracies in the declared medications.   **Note: The testing scope of this panel includes these medications:   Fentanyl (Duragesic)  Oxycodone (OxyIR)   **Note: The testing  scope of this panel does not include small to  moderate amounts of these reported medications:   Aspirin  Topical Lidocaine (Lidoderm)   **Note: The testing scope of this panel does not include the  following reported medications:   Atorvastatin (Lipitor)  Azelastine (Astelin)  Calcium (Tums)  Cetirizine (Zyrtec)  Docusate (Senokot-S)  Estradiol (Estrace)  Fluconazole (Diflucan)  Fluticasone (Flonase)  Furosemide (Lasix)  Mouthwash  Mupirocin (Bactroban)  Nitrofurantoin (Macrobid)  Omeprazole (Prilosec)  Sennosides (Senokot-S) ==================================================================== For clinical consultation, please call [phone]. ====================================================================       ROS  Constitutional: Denies any fever or chills Gastrointestinal: No reported hemesis, hematochezia, vomiting, or acute GI distress Musculoskeletal: Denies any acute onset joint swelling, redness, loss of ROM, or weakness Neurological: No reported episodes of acute onset apraxia, aphasia, dysarthria, agnosia, amnesia, paralysis, loss of coordination, or loss of consciousness  Medication Review  acetaminophen, aspirin EC, atorvastatin, azelastine, calcium carbonate, cetirizine, cholecalciferol, estradiol, fentaNYL, fluticasone, furosemide, multivitamin, mupirocin ointment, naloxone, omeprazole, ondansetron, oxyCODONE, predniSONE, and senna-docusate  History Review  Allergy: Maria Keller is allergic to oxycodone hcl, oxycontin [oxycodone hcl], penicillins, chloral hydrate, ciprofloxacin, ciprofloxacin hcl, colace [docusate calcium], methotrexate derivatives, parafon forte dsc [chlorzoxazone], penicillin g, tape, bextra [valdecoxib], oxycodone, and rofecoxib. Drug: Maria Keller  reports no history of drug use. Alcohol:  reports no history of alcohol use. Tobacco:  reports that she has never smoked. She has never used smokeless tobacco. Social: Maria Keller  reports that  she has never smoked. She has never used smokeless tobacco. She reports that she does not drink alcohol and does not use drugs. Medical:  has a past medical history of Anemia, Arthritis, Bronchitis (08/2015), Chronic back pain, Depression, Diverticulosis, Esophageal stricture, Fibromyalgia, Fibromyalgia, Fibromyalgia, Follicular lymphoma ([place]), Follicular lymphoma ([place]), GERD (gastroesophageal reflux disease), GERD (gastroesophageal reflux disease), Hemorrhoid, Hiatal hernia, History of kidney stones, Hypercholesterolemia, IBS (irritable bowel syndrome), IBS (irritable bowel syndrome), Nephrolithiasis, PONV (postoperative nausea and vomiting), Shingles outbreak (11/26/2011), and Sleep apnea. Surgical: Maria Keller  has a past surgical history that includes Tonsilectomy/adenoidectomy with myringotomy (1970); Appendectomy; Tubal ligation (1976); Vaginal hysterectomy (1980's); Left oophorectomy; Ovarian cyst removal; Bladder surgery (1993); Cholecystectomy (1995); ORIF ankle fracture (2012); Rectocele repair (N/A); Breast biopsy (N/A); Leg Surgery (Left); Tonsillectomy (N/A); Replacement total knee (Right, 54/00/8676); history of helicobacter pylori infection; capsule endoscopy; Joint replacement (Right); Fracture surgery (Left); Esophagogastroduodenoscopy (egd) with propofol (N/A, 06/19/2017); Colonoscopy with propofol (N/A, 06/19/2017); Eye surgery; and Total knee revision (Right, 11/07/2021). Family: family history includes Arthritis in her maternal grandmother; Breast cancer in an other family member; Diabetes in her maternal grandfather and maternal grandmother; Heart disease in her maternal grandmother; Hypertension in her maternal grandmother; Lung cancer in an other family member; Ulcers in her father and mother.  Laboratory Chemistry Profile   Renal Lab Results  Component Value Date   BUN 14 12/04/2021   CREATININE 0.64 12/04/2021   BCR 13 10/12/2021   GFR 87.92 12/04/2021   GFRAA >60 11/06/2012    GFRNONAA >60 11/08/2021    Hepatic Lab Results  Component Value Date   AST 27 12/04/2021   ALT 14 12/04/2021   ALBUMIN 3.7 12/04/2021   ALKPHOS 102 12/04/2021   HCVAB NEGATIVE 05/04/2015    Electrolytes Lab Results  Component Value Date   NA 140 12/04/2021   K 4.4 12/04/2021   CL 104 12/04/2021   CALCIUM 8.7 12/04/2021   MG 2.4 (H) 10/12/2021    Bone Lab Results  Component Value Date   25OHVITD1 32 10/12/2021   25OHVITD2 <1.0 10/12/2021   25OHVITD3 32 10/12/2021    Inflammation (CRP: Acute Phase) (ESR: Chronic Phase) Lab Results  Component Value Date   CRP 3 10/12/2021   ESRSEDRATE 24 10/12/2021         Note: Above Lab  results reviewed.  Recent Imaging Review  VAS [place] LOWER EXTREMITY VENOUS (DVT)  Lower Venous DVT Study  Patient Name:  Maria Keller  Date of Exam:   01/03/2022 Medical Rec #: [id number]     Accession #:    [id number] Date of Birth: 1950-09-17    Patient Gender: F Patient Age:   18 years Exam Location:  [facility] Procedure:      VAS [place] LOWER EXTREMITY VENOUS (DVT) Referring Phys: Eulogio Ditch  --------------------------------------------------------------------------------   Indications: Swelling, and Edema.   Risk Factors: Surgery Right knee replacement. Performing Technologist: Delorise Shiner RVT    Examination Guidelines: A complete evaluation includes B-mode imaging, spectral Doppler, color Doppler, and power Doppler as needed of all accessible portions of each vessel. Bilateral testing is considered an integral part of a complete examination. Limited examinations for reoccurring indications may be performed as noted. The reflux portion of the exam is performed with the patient in reverse Trendelenburg.     +---------+---------------+---------+-----------+----------+--------------+ RIGHT    CompressibilityPhasicitySpontaneityPropertiesThrombus  Aging +---------+---------------+---------+-----------+----------+--------------+ CFV      Full           Yes      Yes                                 +---------+---------------+---------+-----------+----------+--------------+ SFJ      Full           Yes      Yes                                 +---------+---------------+---------+-----------+----------+--------------+ FV Prox  Full           Yes      Yes                                 +---------+---------------+---------+-----------+----------+--------------+ FV Mid   Full           Yes      Yes                                 +---------+---------------+---------+-----------+----------+--------------+ FV DistalFull           Yes      Yes                                 +---------+---------------+---------+-----------+----------+--------------+ PFV      Full                    Yes                                 +---------+---------------+---------+-----------+----------+--------------+ POP      Full           Yes      Yes                                 +---------+---------------+---------+-----------+----------+--------------+ PTV      Full                                                        +---------+---------------+---------+-----------+----------+--------------+  GSV      Full                    Yes                                 +---------+---------------+---------+-----------+----------+--------------+ SSV      Full                                                        +---------+---------------+---------+-----------+----------+--------------+        +----+---------------+---------+-----------+----------+--------------+ LEFTCompressibilityPhasicitySpontaneityPropertiesThrombus Aging +----+---------------+---------+-----------+----------+--------------+ CFV Full           Yes      Yes                                  +----+---------------+---------+-----------+----------+--------------+             Summary: RIGHT: - There is no evidence of deep vein thrombosis in the lower extremity. - There is no evidence of superficial venous thrombosis.   - No cystic structure found in the popliteal fossa.   LEFT: - No evidence of common femoral vein obstruction.    *See table(s) above for measurements and observations.  Electronically signed by Hortencia Pilar MD on 01/16/2022 at 10:02:17 PM.      Final   Note: Reviewed        Physical Exam  General appearance: Well nourished, well developed, and well hydrated. In no apparent acute distress Mental status: Alert, oriented x 3 (person, place, & time)       Respiratory: No evidence of acute respiratory distress Eyes: PERLA Vitals: There were no vitals taken for this visit. BMI: Estimated body mass index is 27.25 kg/m as calculated from the following:   Height as of 12/25/21: '5\' 7"'$  (1.702 m).   Weight as of 01/03/22: 174 lb (78.9 kg). Ideal: Patient weight not recorded  Assessment   Diagnosis Status  No diagnosis found. Controlled Controlled Controlled   Updated Problems: No problems updated.  Plan of Care  Problem-specific:  No problem-specific Assessment & Plan notes found for this encounter.  Maria Keller has a current medication list which includes the following long-term medication(s): atorvastatin, azelastine, calcium carbonate, cetirizine, estradiol, fentanyl, fentanyl, fluticasone, furosemide, omeprazole, oxycodone, and oxycodone.  Pharmacotherapy (Medications Ordered): No orders of the defined types were placed in this encounter.  Orders:  No orders of the defined types were placed in this encounter.  Follow-up plan:   No follow-ups on file.     Interventional Therapies  Risk  Complexity Considerations:   Estimated body mass index is 26.94 kg/m as calculated from the following:   Height as of this encounter: 5'  7" (1.702 m).   Weight as of this encounter: 172 lb (78 kg). 2D6 Metabolic Activity: 7X41 Metabolic Activity: 3A4 Metabolic Activity: 3A5 Metabolic Activity: Intermediate Normal Normal Poor     Planned  Pending:   Downward taper plan:  The neck step then will be to bring down the fentanyl to 87.5 by using: Phase 1: (Approx. 7 weeks) Step 1: 75 mcg + 12.5 mcg patch x 1 wk.  Step 2: 75 mcg x 1 week.  Step 3: 50 mcg +12.5  mcg x 1 week.  Step 4: 50 mcg x 1 week.  Step 5: 25 mcg +12.5 mcg x 1 week.  Step 6: 25 mcg x 1 week.  Step 7: 12.5 mcg x 1 week.  (All of the above while maintaining the oxycodone IR 5 mg tablet, 1 tab p.o. 4 times daily)   Phase 2: (Approx. 6-7 weeks)  Step 1: Oxycodone 5 mg 4/day x1 week  Step 2: Oxycodone 5 mg 3/day x 1 week  Step 3: Oxycodone 5 mg 2/day x 1 week  Step 4: Oxycodone 5 mg 1/day x 1 week  Step 5:  "Drug Holiday" x2-3 weeks   Total estimated time: 14 weeks (Approx. 04/11/22)    Under consideration:   Pending further evaluation   Completed:   None at this time   Completed by other providers:   right ultrasound-guided pes anserine bursa injection (01/13/2021) by Rosalia Hammers, DO  Right total knee replacement (03/19/2016) by Franchot Mimes, MD  Right knee arthroscopy (05/22/2013) by Franchot Mimes, MD  Midline L4-5 LESI (10/18/2014) by Dr. Mohammed Kindle    Therapeutic  Palliative (PRN) options:   None established       Recent Visits Date Type Provider Dept  12/25/21 Office Visit Milinda Pointer, MD [facility]  12/04/21 Office Visit Milinda Pointer, MD [facility]  Showing recent visits within past 90 days and meeting all other requirements Future Appointments Date Type Provider Dept  02/14/22 Appointment Milinda Pointer, MD [facility]  Showing future appointments within next 90 days and meeting all other requirements  I discussed the assessment and treatment plan with the patient. The  patient was provided an opportunity to ask questions and all were answered. The patient agreed with the plan and demonstrated an understanding of the instructions.  Patient advised to call back or seek an in-person evaluation if the symptoms or condition worsens.  Duration of encounter: *** minutes.  Total time on encounter, as per AMA guidelines included both the face-to-face and non-face-to-face time personally spent by the physician and/or other qualified health care professional(s) on the day of the encounter (includes time in activities that require the physician or other qualified health care professional and does not include time in activities normally performed by clinical staff). Physician's time may include the following activities when performed: Preparing to see the patient (e.g., pre-charting review of records, searching for previously ordered imaging, lab work, and nerve conduction tests) Review of prior analgesic pharmacotherapies. Reviewing PMP Interpreting ordered tests (e.g., lab work, imaging, nerve conduction tests) Performing post-procedure evaluations, including interpretation of diagnostic procedures Obtaining and/or reviewing separately obtained history Performing a medically appropriate examination and/or evaluation Counseling and educating the patient/family/caregiver Ordering medications, tests, or procedures Referring and communicating with other health care professionals (when not separately reported) Documenting clinical information in the electronic or other health record Independently interpreting results (not separately reported) and communicating results to the patient/ family/caregiver Care coordination (not separately reported)  Note by: Gaspar Cola, MD Date: 02/14/2022; Time: 6:21 PM

## 2022-02-14 ENCOUNTER — Ambulatory Visit: Payer: Medicare Other | Attending: Pain Medicine | Admitting: Pain Medicine

## 2022-02-14 ENCOUNTER — Encounter: Payer: Self-pay | Admitting: Pain Medicine

## 2022-02-14 VITALS — BP 129/81 | HR 83 | Temp 97.5°F | Ht 67.0 in | Wt 168.0 lb

## 2022-02-14 DIAGNOSIS — G8929 Other chronic pain: Secondary | ICD-10-CM | POA: Diagnosis not present

## 2022-02-14 DIAGNOSIS — M4317 Spondylolisthesis, lumbosacral region: Secondary | ICD-10-CM | POA: Diagnosis not present

## 2022-02-14 DIAGNOSIS — Z79899 Other long term (current) drug therapy: Secondary | ICD-10-CM | POA: Insufficient documentation

## 2022-02-14 DIAGNOSIS — M79651 Pain in right thigh: Secondary | ICD-10-CM | POA: Insufficient documentation

## 2022-02-14 DIAGNOSIS — M79652 Pain in left thigh: Secondary | ICD-10-CM | POA: Diagnosis not present

## 2022-02-14 DIAGNOSIS — M25561 Pain in right knee: Secondary | ICD-10-CM | POA: Diagnosis not present

## 2022-02-14 DIAGNOSIS — Z79891 Long term (current) use of opiate analgesic: Secondary | ICD-10-CM | POA: Diagnosis not present

## 2022-02-14 DIAGNOSIS — G894 Chronic pain syndrome: Secondary | ICD-10-CM | POA: Diagnosis not present

## 2022-02-14 DIAGNOSIS — M47816 Spondylosis without myelopathy or radiculopathy, lumbar region: Secondary | ICD-10-CM | POA: Diagnosis not present

## 2022-02-14 DIAGNOSIS — M545 Low back pain, unspecified: Secondary | ICD-10-CM | POA: Diagnosis not present

## 2022-02-14 DIAGNOSIS — M25512 Pain in left shoulder: Secondary | ICD-10-CM | POA: Diagnosis not present

## 2022-02-14 DIAGNOSIS — Z96651 Presence of right artificial knee joint: Secondary | ICD-10-CM | POA: Diagnosis not present

## 2022-02-14 DIAGNOSIS — M25511 Pain in right shoulder: Secondary | ICD-10-CM | POA: Diagnosis not present

## 2022-02-14 NOTE — Patient Instructions (Signed)
______________________________________________________________________  Preparing for your procedure  During your procedure appointment there will be: No Prescription Refills. No disability issues to discussed. No medication changes or discussions.  Instructions: Food intake: Avoid eating anything solid for at least 8 hours prior to your procedure. Clear liquid intake: You may take clear liquids such as water up to 2 hours prior to your procedure. (No carbonated drinks. No soda.) Transportation: Unless otherwise stated by your physician, bring a driver. Morning Medicines: Except for blood thinners, take all of your other morning medications with a sip of water. Make sure to take your heart and blood pressure medicines. If your blood pressure's lower number is above 100, the case will be rescheduled. Blood thinners: If you take a blood thinner, but were not instructed to stop it, call our office [phone] and ask to talk to a nurse. Not stopping a blood thinner prior to certain procedures could lead to serious complications. Diabetics on insulin: Notify the staff so that you can be scheduled 1st case in the morning. If your diabetes requires high dose insulin, take only  of your normal insulin dose the morning of the procedure and notify the staff that you have done so. Preventing infections: Shower with an antibacterial soap the morning of your procedure.  Build-up your immune system: Take 1000 mg of Vitamin C with every meal (3 times a day) the day prior to your procedure. Antibiotics: Inform the nursing staff if you are taking any antibiotics or if you have any conditions that may require antibiotics prior to procedures. (Example: recent joint implants)   Pregnancy: If you are pregnant make sure to notify the nursing staff. Not doing so may result in injury to the fetus, including death.  Sickness: If you have a cold, fever, or any active infections, call and cancel or reschedule your  procedure. Receiving steroids while having an infection may result in complications. Arrival: You must be in the facility at least 30 minutes prior to your scheduled procedure. Tardiness: Your scheduled time is also the cutoff time. If you do not arrive at least 15 minutes prior to your procedure, you will be rescheduled.  Children: Do not bring any children with you. Make arrangements to keep them home. Dress appropriately: There is always a possibility that your clothing may get soiled. Avoid long dresses. Valuables: Do not bring any jewelry or valuables.  Reasons to call and reschedule or cancel your procedure: (Following these recommendations will minimize the risk of a serious complication.) Surgeries: Avoid having procedures within 2 weeks of any surgery. (Avoid for 2 weeks before or after any surgery). Flu Shots: Avoid having procedures within 2 weeks of a flu shots or . (Avoid for 2 weeks before or after immunizations). Barium: Avoid having a procedure within 7-10 days after having had a radiological study involving the use of radiological contrast. (Myelograms, Barium swallow or enema study). Heart attacks: Avoid any elective procedures or surgeries for the initial 6 months after a "Myocardial Infarction" (Heart Attack). Blood thinners: It is imperative that you stop these medications before procedures. Let us know if you if you take any blood thinner.  Infection: Avoid procedures during or within two weeks of an infection (including chest colds or gastrointestinal problems). Symptoms associated with infections include: Localized redness, fever, chills, night sweats or profuse sweating, burning sensation when voiding, cough, congestion, stuffiness, runny nose, sore throat, diarrhea, nausea, vomiting, cold or Flu symptoms, recent or current infections. It is specially important if the infection is   over the area that we intend to treat. Heart and lung problems: Symptoms that may suggest an  active cardiopulmonary problem include: cough, chest pain, breathing difficulties or shortness of breath, dizziness, ankle swelling, uncontrolled high or unusually low blood pressure, and/or palpitations. If you are experiencing any of these symptoms, cancel your procedure and contact your primary care physician for an evaluation.  Remember:  Regular Business hours are:  Monday to Thursday 8:00 AM to 4:00 PM  Provider's Schedule: Kimberlie Csaszar, MD:  Procedure days: Tuesday and Thursday 7:30 AM to 4:00 PM  Bilal Lateef, MD:  Procedure days: Monday and Wednesday 7:30 AM to 4:00 PM  ______________________________________________________________________    ____________________________________________________________________________________________  General Risks and Possible Complications  Patient Responsibilities: It is important that you read this as it is part of your informed consent. It is our duty to inform you of the risks and possible complications associated with treatments offered to you. It is your responsibility as a patient to read this and to ask questions about anything that is not clear or that you believe was not covered in this document.  Patient's Rights: You have the right to refuse treatment. You also have the right to change your mind, even after initially having agreed to have the treatment done. However, under this last option, if you wait until the last second to change your mind, you may be charged for the materials used up to that point.  Introduction: Medicine is not an exact science. Everything in Medicine, including the lack of treatment(s), carries the potential for danger, harm, or loss (which is by definition: Risk). In Medicine, a complication is a secondary problem, condition, or disease that can aggravate an already existing one. All treatments carry the risk of possible complications. The fact that a side effects or complications occurs, does not imply  that the treatment was conducted incorrectly. It must be clearly understood that these can happen even when everything is done following the highest safety standards.  No treatment: You can choose not to proceed with the proposed treatment alternative. The "PRO(s)" would include: avoiding the risk of complications associated with the therapy. The "CON(s)" would include: not getting any of the treatment benefits. These benefits fall under one of three categories: diagnostic; therapeutic; and/or palliative. Diagnostic benefits include: getting information which can ultimately lead to improvement of the disease or symptom(s). Therapeutic benefits are those associated with the successful treatment of the disease. Finally, palliative benefits are those related to the decrease of the primary symptoms, without necessarily curing the condition (example: decreasing the pain from a flare-up of a chronic condition, such as incurable terminal cancer).  General Risks and Complications: These are associated to most interventional treatments. They can occur alone, or in combination. They fall under one of the following six (6) categories: no benefit or worsening of symptoms; bleeding; infection; nerve damage; allergic reactions; and/or death. No benefits or worsening of symptoms: In Medicine there are no guarantees, only probabilities. No healthcare provider can ever guarantee that a medical treatment will work, they can only state the probability that it may. Furthermore, there is always the possibility that the condition may worsen, either directly, or indirectly, as a consequence of the treatment. Bleeding: This is more common if the patient is taking a blood thinner, either prescription or over the counter (example: Goody Powders, Fish oil, Aspirin, Garlic, etc.), or if suffering a condition associated with impaired coagulation (example: Hemophilia, cirrhosis of the liver, low platelet counts, etc.). However, even if   you  do not have one on these, it can still happen. If you have any of these conditions, or take one of these drugs, make sure to notify your treating physician. Infection: This is more common in patients with a compromised immune system, either due to disease (example: diabetes, cancer, human immunodeficiency virus [HIV], etc.), or due to medications or treatments (example: therapies used to treat cancer and rheumatological diseases). However, even if you do not have one on these, it can still happen. If you have any of these conditions, or take one of these drugs, make sure to notify your treating physician. Nerve Damage: This is more common when the treatment is an invasive one, but it can also happen with the use of medications, such as those used in the treatment of cancer. The damage can occur to small secondary nerves, or to large primary ones, such as those in the spinal cord and brain. This damage may be temporary or permanent and it may lead to impairments that can range from temporary numbness to permanent paralysis and/or brain death. Allergic Reactions: Any time a substance or material comes in contact with our body, there is the possibility of an allergic reaction. These can range from a mild skin rash (contact dermatitis) to a severe systemic reaction (anaphylactic reaction), which can result in death. Death: In general, any medical intervention can result in death, most of the time due to an unforeseen complication. ____________________________________________________________________________________________    

## 2022-02-14 NOTE — Progress Notes (Signed)
Nursing Pain Medication Assessment:  Safety precautions to be maintained throughout the outpatient stay will include: orient to surroundings, keep bed in low position, maintain call bell within reach at all times, provide assistance with transfer out of bed and ambulation.  Medication Inspection Compliance: Pill count conducted under aseptic conditions, in front of the patient. Neither the pills nor the bottle was removed from the patient's sight at any time. Once count was completed pills were immediately returned to the patient in their original bottle.  Medication #1: Fentanyl patch  27mg Pill/Patch Count:  1 of 5 pills remain Pill/Patch Appearance: Markings consistent with prescribed medication Bottle Appearance: Standard pharmacy container. Clearly labeled. Filled Date: 128/ 24 / 2023 Last Medication intake:   01-10-22  Medication #2: Fentanyl patch 12 mcg Pill/Patch Count:  10 of 10 pills remain Pill/Patch Appearance: Markings consistent with prescribed medication Bottle Appearance: Standard pharmacy container. Clearly labeled. Filled Date: 138/ 3 / 2023 Last Medication intake:   01-22-22   Medication: Fentanyl patch  258m Pill/Patch Count:  1 of 5 pills remain Pill/Patch Appearance: Markings consistent with prescribed medication Bottle Appearance: Standard pharmacy container. Clearly labeled. Filled Date: 1178 24 / 2023 Last Medication intake:   01/18/22  Medication: Oxycodone IR Pill/Patch Count:  77 of 120 pills remain Pill/Patch Appearance: Markings consistent with prescribed medication Bottle Appearance: Standard pharmacy container. Clearly labeled. Filled Date: 1123 10 / 2023 Last Medication intake:  TodaySafety precautions to be maintained throughout the outpatient stay will include: orient to surroundings, keep bed in low position, maintain call bell within reach at all times, provide assistance with transfer out of bed and ambulation.

## 2022-02-19 ENCOUNTER — Telehealth: Payer: Self-pay

## 2022-02-19 NOTE — Telephone Encounter (Signed)
Per Dr. Dossie Arbour, she will need antibiotics, but will administer per IV when she is here. Patient notified.

## 2022-02-19 NOTE — Telephone Encounter (Signed)
I scheduled her facets for Thursday. She says she had a knee replacement and wants to know if she should be on antibiotics for the procedure. Please call and let her know

## 2022-02-20 LAB — TOXASSURE SELECT 13 (MW), URINE

## 2022-02-22 ENCOUNTER — Ambulatory Visit
Admission: RE | Admit: 2022-02-22 | Discharge: 2022-02-22 | Disposition: A | Discharge status: Home / Self Care | Payer: Medicare Other | Source: Ambulatory Visit | Attending: Pain Medicine | Admitting: Pain Medicine

## 2022-02-22 ENCOUNTER — Encounter: Payer: Self-pay | Admitting: Pain Medicine

## 2022-02-22 ENCOUNTER — Ambulatory Visit: Payer: Medicare Other | Attending: Pain Medicine | Admitting: Pain Medicine

## 2022-02-22 VITALS — BP 139/65 | HR 83 | Temp 97.7°F | Resp 16 | Ht 67.0 in | Wt 168.0 lb

## 2022-02-22 DIAGNOSIS — M5137 Other intervertebral disc degeneration, lumbosacral region: Secondary | ICD-10-CM | POA: Insufficient documentation

## 2022-02-22 DIAGNOSIS — M51379 Other intervertebral disc degeneration, lumbosacral region without mention of lumbar back pain or lower extremity pain: Secondary | ICD-10-CM

## 2022-02-22 DIAGNOSIS — Z792 Long term (current) use of antibiotics: Secondary | ICD-10-CM | POA: Diagnosis not present

## 2022-02-22 DIAGNOSIS — Z96651 Presence of right artificial knee joint: Secondary | ICD-10-CM

## 2022-02-22 DIAGNOSIS — M47817 Spondylosis without myelopathy or radiculopathy, lumbosacral region: Secondary | ICD-10-CM | POA: Diagnosis not present

## 2022-02-22 DIAGNOSIS — G8929 Other chronic pain: Secondary | ICD-10-CM | POA: Diagnosis not present

## 2022-02-22 DIAGNOSIS — M47816 Spondylosis without myelopathy or radiculopathy, lumbar region: Secondary | ICD-10-CM | POA: Diagnosis not present

## 2022-02-22 DIAGNOSIS — M4317 Spondylolisthesis, lumbosacral region: Secondary | ICD-10-CM | POA: Diagnosis not present

## 2022-02-22 DIAGNOSIS — M545 Low back pain, unspecified: Secondary | ICD-10-CM | POA: Diagnosis not present

## 2022-02-22 MED ORDER — FENTANYL CITRATE (PF) 100 MCG/2ML IJ SOLN: 25.0000 ug | INTRAMUSCULAR | Status: DC | PRN

## 2022-02-22 MED ORDER — PENTAFLUOROPROP-TETRAFLUOROETH EX AERO
INHALATION_SPRAY | Freq: Once | CUTANEOUS | Status: AC
  Administered 2022-02-22: 30 via TOPICAL

## 2022-02-22 MED ORDER — MIDAZOLAM HCL 5 MG/5ML IJ SOLN
0.5000 mg | Freq: Once | INTRAMUSCULAR | Status: AC
  Administered 2022-02-22: 2 mg via INTRAVENOUS

## 2022-02-22 MED ORDER — FENTANYL CITRATE (PF) 100 MCG/2ML IJ SOLN
INTRAMUSCULAR | Status: AC
  Filled 2022-02-22: qty 2

## 2022-02-22 MED ORDER — ROPIVACAINE HCL 2 MG/ML IJ SOLN
18.0000 mL | Freq: Once | INTRAMUSCULAR | Status: AC
  Administered 2022-02-22: 18 mL via PERINEURAL

## 2022-02-22 MED ORDER — MIDAZOLAM HCL 5 MG/5ML IJ SOLN
INTRAMUSCULAR | Status: AC
  Filled 2022-02-22: qty 5

## 2022-02-22 MED ORDER — VANCOMYCIN HCL IN DEXTROSE 1-5 GM/200ML-% IV SOLN
1000.0000 mg | Freq: Once | INTRAVENOUS | Status: AC
  Administered 2022-02-22: 1000 mg via INTRAVENOUS
  Filled 2022-02-22: qty 200

## 2022-02-22 MED ORDER — LIDOCAINE HCL 2 % IJ SOLN
20.0000 mL | Freq: Once | INTRAMUSCULAR | Status: AC
  Administered 2022-02-22: 400 mg

## 2022-02-22 MED ORDER — TRIAMCINOLONE ACETONIDE 40 MG/ML IJ SUSP
80.0000 mg | Freq: Once | INTRAMUSCULAR | Status: AC
  Administered 2022-02-22: 80 mg

## 2022-02-22 MED ORDER — LIDOCAINE HCL 2 % IJ SOLN
INTRAMUSCULAR | Status: AC
  Filled 2022-02-22: qty 20

## 2022-02-22 MED ORDER — TRIAMCINOLONE ACETONIDE 40 MG/ML IJ SUSP
INTRAMUSCULAR | Status: AC
  Filled 2022-02-22: qty 1

## 2022-02-22 MED ORDER — LACTATED RINGERS IV SOLN: Freq: Once | INTRAVENOUS | Status: AC

## 2022-02-22 NOTE — Progress Notes (Signed)
PROVIDER NOTE: Interpretation of information contained herein should be left to medically-trained personnel. Specific patient instructions are provided elsewhere under "Patient Instructions" section of medical record. This document was created in part using STT-dictation technology, any transcriptional errors that may result from this process are unintentional.  Patient: Maria Keller Type: Established DOB: 12/10/1950 MRN: [id number] PCP: Einar Pheasant, MD  Service: Procedure DOS: 02/22/2022 Setting: Ambulatory Location: Ambulatory outpatient facility Delivery: Face-to-face Provider: Gaspar Cola, MD Specialty: Interventional Pain Management Specialty designation: 09 Location: Outpatient facility Ref. Prov.: Milinda Pointer, MD    Procedure:           Type: Lumbar Facet, Medial Branch Block(s) #1  Laterality: Bilateral  Level: L3, L4, L5, and S1 Medial Branch Level(s). Injecting these levels blocks the L4-5 and L5-S1 lumbar facet joints. Imaging: Fluoroscopic guidance         Anesthesia: Local anesthesia (1-2% Lidocaine) Anxiolysis: IV Versed 2.0 mg Sedation: Moderate Sedation                       DOS: 02/22/2022 Performed by: Gaspar Cola, MD  Primary Purpose: Diagnostic/Therapeutic Indications: Low back pain severe enough to impact quality of life or function. 1. Lumbar facet syndrome   2. Spondylosis without myelopathy or radiculopathy, lumbosacral region   3. Grade 1 Anterolisthesis of lumbosacral spine of L5/S1   4. Lumbosacral facet arthropathy   5. DDD (degenerative disc disease), lumbosacral   6. Chronic low back pain (1ry area of Pain) (Bilateral) (R>L) w/o sciatica    NAS-11 Pain score:   Pre-procedure: 6 /10   Post-procedure: 2 /10     Position / Prep / Materials:  Position: Prone  Prep solution: DuraPrep (Iodine Povacrylex [0.7% available iodine] and Isopropyl Alcohol, 74% w/w) Area Prepped: Posterolateral Lumbosacral Spine (Wide prep: From the  lower border of the scapula down to the end of the tailbone and from flank to flank.)  Materials:  Tray: Block Needle(s):  Type: Spinal  Gauge (G): 22  Length: 3.5-in Qty: 4     Pre-op H&P Assessment:  Maria Keller is a 72 y.o. (year old), female patient, seen today for interventional treatment. 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 Maria Keller has a current medication list which includes the following prescription(s): acetaminophen, vitamin c, aspirin ec, atorvastatin, azelastine, calcium carbonate, calcium carbonate, cetirizine, cholecalciferol, estradiol, fluticasone, furosemide, multivitamin, mupirocin ointment, naloxone, omeprazole, ondansetron, oxycodone, and senna-docusate, and the following Facility-Administered Medications: fentanyl and lactated ringers. Her primarily concern today is the Back Pain (Lumbar bilateral )  Initial Vital Signs:  Pulse/HCG Rate: 83ECG Heart Rate: 79 (NSR) Temp: 97.7 F (36.5 C) Resp: 16 BP: (!) 122/91 SpO2: 98 %  BMI: Estimated body mass index is 26.31 kg/m as calculated from the following:   Height as of this encounter: '5\' 7"'$  (1.702 m).   Weight as of this encounter: 168 lb (76.2 kg).  Risk Assessment: Allergies: Reviewed. She is allergic to oxycodone hcl, oxycontin [oxycodone hcl], penicillins, chloral hydrate,  ciprofloxacin, ciprofloxacin hcl, colace [docusate calcium], methotrexate derivatives,  parafon forte dsc [chlorzoxazone], penicillin g, tape, bextra [valdecoxib], oxycodone, and rofecoxib.  Allergy Precautions: None required Coagulopathies: Reviewed. None identified.  Blood-thinner therapy: None at this time Active Infection(s): Reviewed. None identified. Maria Keller is afebrile  Site Confirmation: Maria Keller was asked to confirm the procedure and laterality before marking the site Procedure checklist: Completed Consent: Before the procedure and under the influence of no sedative(s), amnesic(s), or anxiolytics, the patient was informed of the treatment options, risks and possible complications. To fulfill our ethical and legal obligations, as recommended by the American Medical Association's Code of Ethics, I have informed the patient of my clinical impression; the nature and purpose of the treatment or procedure; the risks, benefits, and possible complications of the intervention; the alternatives, including doing nothing; the risk(s) and benefit(s) of the alternative treatment(s) or procedure(s); and the risk(s) and benefit(s) of doing nothing. The patient was provided information about the general risks and possible complications associated with the procedure. These may include, but are not limited to: failure to achieve desired goals, infection, bleeding, organ or nerve damage, allergic reactions, paralysis, and death. In addition, the patient was informed of those risks and complications associated to Spine-related procedures, such as failure to decrease pain; infection (i.e.: Meningitis, epidural or intraspinal abscess); bleeding (i.e.: epidural hematoma, subarachnoid hemorrhage, or any other type of intraspinal or peri-dural bleeding); organ or nerve damage (i.e.: Any type of peripheral nerve, nerve root, or spinal cord injury) with subsequent damage to sensory, motor, and/or autonomic systems, resulting  in permanent pain, numbness, and/or weakness of one or several areas of the body; allergic reactions; (i.e.: anaphylactic reaction); and/or death. Furthermore, the patient was informed of those risks and complications associated with the medications. These include, but are not limited to: allergic reactions (i.e.: anaphylactic or anaphylactoid reaction(s)); adrenal axis suppression; blood sugar elevation that in diabetics may result in ketoacidosis or comma; water retention that in patients with history of congestive heart failure may result in shortness of breath, pulmonary edema, and decompensation with resultant heart failure; weight gain; swelling or edema; medication-induced neural toxicity; particulate matter embolism and blood vessel occlusion with resultant organ, and/or nervous system infarction; and/or aseptic necrosis of one or more joints. Finally, the patient was informed that Medicine is not an exact science; therefore, there is also the possibility of unforeseen or unpredictable risks and/or possible complications that may result in a catastrophic outcome. The patient indicated having understood very clearly. We have given the patient no guarantees and we have made no promises. Enough time was given to the patient to ask questions, all of which were answered to the patient's satisfaction. Ms. Tarrant has indicated that she wanted to continue with the procedure. Attestation: I, the ordering provider, attest that I have discussed with the patient the benefits, risks, side-effects, alternatives, likelihood of achieving goals, and potential problems during recovery for the procedure that I have provided informed consent. Date  Time: 02/22/2022  9:23 AM  Pre-Procedure Preparation:  Monitoring: As per clinic protocol. Respiration, ETCO2, SpO2, BP, heart rate and rhythm monitor placed and checked for adequate function Safety Precautions: Patient was assessed for positional comfort and pressure points  before starting the procedure. Time-out: I initiated and conducted the "Time-out" before starting the procedure, as per protocol. The patient was asked to participate by confirming the accuracy of the "Time Out" information. Verification of the correct person, site, and procedure were performed and confirmed by me, the nursing staff, and the patient. "Time-out" conducted as per Joint  Commission's Universal Protocol ([id number].01.01.01). Time: 1029  Description of Procedure:          Laterality: Bilateral. The procedure was performed in identical fashion on both sides. Targeted Levels: L3, L4, L5, and S1  Medial Branch Level(s)  Safety Precautions: Aspiration looking for blood return was conducted prior to all injections. At no point did we inject any substances, as a needle was being advanced. Before injecting, the patient was told to immediately notify me if she was experiencing any new onset of "ringing in the ears, or metallic taste in the mouth". No attempts were made at seeking any paresthesias. Safe injection practices and needle disposal techniques used. Medications properly checked for expiration dates. SDV (single dose vial) medications used. After the completion of the procedure, all disposable equipment used was discarded in the proper designated medical waste containers. Local Anesthesia: Protocol guidelines were followed. The patient was positioned over the fluoroscopy table. The area was prepped in the usual manner. The time-out was completed. The target area was identified using fluoroscopy. A 12-in long, straight, sterile hemostat was used with fluoroscopic guidance to locate the targets for each level blocked. Once located, the skin was marked with an approved surgical skin marker. Once all sites were marked, the skin (epidermis, dermis, and hypodermis), as well as deeper tissues (fat, connective tissue and muscle) were infiltrated with a small amount of a short-acting local anesthetic, loaded on  a 10cc syringe with a 25G, 1.5-in  Needle. An appropriate amount of time was allowed for local anesthetics to take effect before proceeding to the next step. Local Anesthetic: Lidocaine 2.0% The unused portion of the local anesthetic was discarded in the proper designated containers. Technical description of process:   L3 Medial Branch Nerve Block (MBB): The target area for the L3 medial branch is at the junction of the postero-lateral aspect of the superior articular process and the superior, posterior, and medial edge of the transverse process of L4. Under fluoroscopic guidance, a Quincke needle was inserted until contact was made with os over the superior postero-lateral aspect of the pedicular shadow (target area). After negative aspiration for blood, 0.5 mL of the nerve block solution was injected without difficulty or complication. The needle was removed intact. L4 Medial Branch Nerve Block (MBB): The target area for the L4 medial branch is at the junction of the postero-lateral aspect of the superior articular process and the superior, posterior, and medial edge of the transverse process of L5. Under fluoroscopic guidance, a Quincke needle was inserted until contact was made with os over the superior postero-lateral aspect of the pedicular shadow (target area). After negative aspiration for blood, 0.5 mL of the nerve block solution was injected without difficulty or complication. The needle was removed intact. L5 Medial Branch Nerve Block (MBB): The target area for the L5 medial branch is at the junction of the postero-lateral aspect of the superior articular process and the superior, posterior, and medial edge of the sacral ala. Under fluoroscopic guidance, a Quincke needle was inserted until contact was made with os over the superior postero-lateral aspect of the pedicular shadow (target area). After negative aspiration for blood, 0.5 mL of the nerve block solution was injected without difficulty or  complication. The needle was removed intact. S1 Medial Branch Nerve Block (MBB): The target area for the S1 medial branch is at the posterior and inferior 6 o'clock position of the L5-S1 facet joint. Under fluoroscopic guidance, the Quincke needle inserted for the L5 MBB was redirected  until contact was made with os over the inferior and postero aspect of the sacrum, at the 6 o' clock position under the L5-S1 facet joint (Target area). After negative aspiration for blood, 0.5 mL of the nerve block solution was injected without difficulty or complication. The needle was removed intact.  Once the entire procedure was completed, the treated area was cleaned, making sure to leave some of the prepping solution back to take advantage of its long term bactericidal properties.         Illustration of the posterior view of the lumbar spine and the posterior neural structures. Laminae of L2 through S1 are labeled. DPRL5, dorsal primary ramus of L5; DPRS1, dorsal primary ramus of S1; DPR3, dorsal primary ramus of L3; FJ, facet (zygapophyseal) joint L3-L4; I, inferior articular process of L4; LB1, lateral branch of dorsal primary ramus of L1; IAB, inferior articular branches from L3 medial branch (supplies L4-L5 facet joint); IBP, intermediate branch plexus; MB3, medial branch of dorsal primary ramus of L3; NR3, third lumbar nerve root; S, superior articular process of L5; SAB, superior articular branches from L4 (supplies L4-5 facet joint also); TP3, transverse process of L3.  Vitals:   02/22/22 1025 02/22/22 1031 02/22/22 1036 02/22/22 1047  BP: (!) 169/83 (!) 158/74 (!) 148/73 139/65  Pulse:      Resp: '18 16 18 16  '$ Temp:      TempSrc:      SpO2: 100% 100% 100% 95%  Weight:      Height:         Start Time: 1029 hrs. End Time: 1038 hrs.  Imaging Guidance (Spinal):          Type of Imaging Technique: Fluoroscopy Guidance (Spinal) Indication(s): Assistance in needle guidance and placement for  procedures requiring needle placement in or near specific anatomical locations not easily accessible without such assistance. Exposure Time: Please see nurses notes. Contrast: None used. Fluoroscopic Guidance: I was personally present during the use of fluoroscopy. "Tunnel Vision Technique" used to obtain the best possible view of the target area. Parallax error corrected before commencing the procedure. "Direction-depth-direction" technique used to introduce the needle under continuous pulsed fluoroscopy. Once target was reached, antero-posterior, oblique, and lateral fluoroscopic projection used confirm needle placement in all planes. Images permanently stored in EMR. Interpretation: No contrast injected. I personally interpreted the imaging intraoperatively. Adequate needle placement confirmed in multiple planes. Permanent images saved into the patient's record.  Antibiotic Prophylaxis:   Anti-infectives (From admission, onward)    Start     Dose/Rate Route Frequency Ordered Stop   02/22/22 1000  vancomycin (VANCOCIN) IVPB 1000 mg/200 mL premix       Note to Pharmacy: Pediatric Recommended Dose: 15 mg/kg   1,000 mg 200 mL/hr over 60 Minutes Intravenous  Once 02/22/22 0946 02/22/22 1101      Indication(s): None identified  Post-operative Assessment:  Post-procedure Vital Signs:  Pulse/HCG Rate: 8377 Temp: 97.7 F (36.5 C) Resp: 16 BP: 139/65 SpO2: 95 %  EBL: None  Complications: No immediate post-treatment complications observed by team, or reported by patient.  Note: The patient tolerated the entire procedure well. A repeat set of vitals were taken after the procedure and the patient was kept under observation following institutional policy, for this type of procedure. Post-procedural neurological assessment was performed, showing return to baseline, prior to discharge. The patient was provided with post-procedure discharge instructions, including a section on how to identify  potential problems. Should any problems arise concerning this  procedure, the patient was given instructions to immediately contact us, at any time, without hesitation. In any case, we plan to contact the patient by telephone for a follow-up status report regarding this interventional procedure.  Comments:  No additional relevant information.  Plan of Care  Orders:  Orders Placed This Encounter  Procedures   LUMBAR FACET(MEDIAL BRANCH NERVE BLOCK) MBNB    Scheduling Instructions:     Procedure: Lumbar facet block (AKA.: Lumbosacral medial branch nerve block)     Side: Bilateral     Level: L4-5 and L5-S1 Facets (L3, L4, L5, and S1 Medial Branch Nerves)     Sedation: Patient's choice.     Timeframe: Today    Order Specific Question:   Where will this procedure be performed?    Answer:   [facility]   DG [facility] C-ARM 1-60 MIN NO REPORT    Intraoperative interpretation by procedural physician at [place].    Standing Status:   Standing    Number of Occurrences:   1    Order Specific Question:   Reason for exam:    Answer:   Assistance in needle guidance and placement for procedures requiring needle placement in or near specific anatomical locations not easily accessible without such assistance.   Informed Consent Details: Physician/Practitioner Attestation; Transcribe to consent form and obtain patient signature    Nursing Order: Transcribe to consent form and obtain patient signature. Note: Always confirm laterality of pain with Ms. Corinna Capra, before procedure.    Order Specific Question:   Physician/Practitioner attestation of informed consent for procedure/surgical case    Answer:   I, the physician/practitioner, attest that I have discussed with the patient the benefits, risks, side effects, alternatives, likelihood of achieving goals and potential problems during recovery for the procedure that I have provided informed consent.    Order Specific Question:    Procedure    Answer:   Lumbar Facet Block  under fluoroscopic guidance    Order Specific Question:   Physician/Practitioner performing the procedure    Answer:   Lacreshia Bondarenko A. Dossie Arbour MD    Order Specific Question:   Indication/Reason    Answer:   Low Back Pain, with our without leg pain, due to Facet Joint Arthralgia (Joint Pain) Spondylosis (Arthritis of the Spine), without myelopathy or radiculopathy (Nerve Damage).   Provide equipment / supplies at bedside    Procedure tray: "Block Tray" (Disposable  single use) Skin infiltration needle: Regular 1.5-in, 25-G, (x1) Block Needle type: Spinal Amount/quantity: 4 Size: Regular (3.5-inch) Gauge: 22G    Standing Status:   Standing    Number of Occurrences:   1    Order Specific Question:   Specify    Answer:   Block Tray   Chronic Opioid Analgesic:  Oxycodone IR 5 mg tablet, 1 tab p.o. twice daily.  (Still tapering down on 02/14/2022). MME/day: Down to 15 mg/day from 487.5 mg/day   Medications ordered for procedure: Meds ordered this encounter  Medications   lidocaine (XYLOCAINE) 2 % (with pres) injection 400 mg   pentafluoroprop-tetrafluoroeth (GEBAUERS) aerosol   lactated ringers infusion   midazolam (VERSED) 5 MG/5ML injection 0.5-2 mg    Make sure Flumazenil is available in the pyxis when using this medication. If oversedation occurs, administer 0.2 mg IV over 15 sec. If after 45 sec no response, administer 0.2 mg again over 1 min; may repeat at 1 min intervals; not to exceed 4 doses (1 mg)   fentaNYL (SUBLIMAZE)  injection 25-50 mcg    Make sure Narcan is available in the pyxis when using this medication. In the event of respiratory depression (RR< 8/min): Titrate NARCAN (naloxone) in increments of 0.1 to 0.2 mg IV at 2-3 minute intervals, until desired degree of reversal.   ropivacaine (PF) 2 mg/mL (0.2%) (NAROPIN) injection 18 mL   triamcinolone acetonide (KENALOG-40) injection 80 mg   vancomycin (VANCOCIN) IVPB 1000 mg/200 mL  premix    Pediatric Recommended Dose: 15 mg/kg    Order Specific Question:   Indication:    Answer:   Other Indication (list below)    Order Specific Question:   Other Indication:    Answer:   Procedure Prophylaxis   Medications administered: We administered lidocaine, pentafluoroprop-tetrafluoroeth, lactated ringers, midazolam, ropivacaine (PF) 2 mg/mL (0.2%), triamcinolone acetonide, and vancomycin.  See the medical record for exact dosing, route, and time of administration.  Follow-up plan:   Return in about 2 weeks (around 03/08/2022) for Proc-day (T,Th), (F2F), (PPE).       Interventional Therapies  Risk  Complexity Considerations:      Planned  Pending:   Diagnostic bilateral lumbar facet (L4-5, L5-S1) MBB #1    Under consideration:   Diagnostic bilateral lumbar facet (L4-5, L5-S1) MBB #1    Completed:   None at this time   Completed by other providers:   right ultrasound-guided pes anserine bursa injection (01/13/2021) by Rosalia Hammers, DO  Right total knee replacement (03/19/2016) by Franchot Mimes, MD  Right knee arthroscopy (05/22/2013) by Franchot Mimes, MD  Midline L4-5 LESI (10/18/2014) by Dr. Mohammed Kindle    Therapeutic  Palliative (PRN) options:   None established   Pharmacotherapy  2D6 Metabolic Activity: Intermediate 6R67 Metabolic Activity: Normal 3A4 Metabolic Activity: Normal 3A5 Metabolic Activity: Poor patient of Dr. Mohammed Kindle who had her on opioid analgesics totaling 487.5 MME/day by 10/12/2021.  Slow taper performed and by 02/14/2022 the patient was still on the taper but she was down to 15 MME/day.  The goal is to have the patient completely off of the narcotics and control the pain with interventional therapies.       Recent Visits Date Type Provider Dept  02/14/22 Office Visit Milinda Pointer, MD [facility]  12/25/21 Office Visit Milinda Pointer, MD [facility]  12/04/21 Office Visit Milinda Pointer, MD  [facility]  Showing recent visits within past 90 days and meeting all other requirements Today's Visits Date Type Provider Dept  02/22/22 Procedure visit Milinda Pointer, MD [facility]  Showing today's visits and meeting all other requirements Future Appointments Date Type Provider Dept  03/13/22 Appointment Milinda Pointer, MD [facility]  Showing future appointments within next 90 days and meeting all other requirements  Disposition: Discharge home  Discharge (Date  Time): 02/22/2022; 1100 hrs.   Primary Care Physician: Einar Pheasant, MD Location: [facility] Note by: Gaspar Cola, MD Date: 02/22/2022; Time: 11:23 AM  Disclaimer:  Medicine is not an Chief Strategy Officer. The only guarantee in medicine is that nothing is guaranteed. It is important to note that the decision to proceed with this intervention was based on the information collected from the patient. The Data and conclusions were drawn from the patient's questionnaire, the interview, and the physical examination. Because the information was provided in large part by the patient, it cannot be guaranteed that it has not been purposely or unconsciously manipulated. Every effort has been made to obtain as much  relevant data as possible for this evaluation. It is important to note that the conclusions that lead to this procedure are derived in large part from the available data. Always take into account that the treatment will also be dependent on availability of resources and existing treatment guidelines, considered by other Pain Management Practitioners as being common knowledge and practice, at the time of the intervention. For Medico-Legal purposes, it is also important to point out that variation in procedural techniques and pharmacological choices are the acceptable norm. The indications, contraindications, technique, and results of the above procedure should only  be interpreted and judged by a Board-Certified Interventional Pain Specialist with extensive familiarity and expertise in the same exact procedure and technique.

## 2022-02-22 NOTE — Patient Instructions (Signed)
____________________________________________________________________________________________  Post-Procedure Discharge Instructions  Instructions:  Apply ice:   Purpose: This will minimize any swelling and discomfort after procedure.   When: Day of procedure, as soon as you get home.  How: Fill a plastic sandwich bag with crushed ice. Cover it with a small towel and apply to injection site.  How long: (15 min on, 15 min off) Apply for 15 minutes then remove x 15 minutes.  Repeat sequence on day of procedure, until you go to bed.  Apply heat:   Purpose: To treat any soreness and discomfort from the procedure.  When: Starting the next day after the procedure.  How: Apply heat to procedure site starting the day following the procedure.  How long: May continue to repeat daily, until discomfort goes away.  Food intake: Start with clear liquids (like water) and advance to regular food, as tolerated.   Physical activities: Keep activities to a minimum for the first 8 hours after the procedure. After that, then as tolerated.  Driving: If you have received any sedation, be responsible and do not drive. You are not allowed to drive for 24 hours after having sedation.  Blood thinner: (Applies only to those taking blood thinners) You may restart your blood thinner 6 hours after your procedure.  Insulin: (Applies only to Diabetic patients taking insulin) As soon as you can eat, you may resume your normal dosing schedule.  Infection prevention: Keep procedure site clean and dry. Shower daily and clean area with soap and water.  Post-procedure Pain Diary: Extremely important that this be done correctly and accurately. Recorded information will be used to determine the next step in treatment. For the purpose of accuracy, follow these rules:  Evaluate only the area treated. Do not report or include pain from an untreated area. For the purpose of this evaluation, ignore all other areas of pain,  except for the treated area.  After your procedure, avoid taking a long nap and attempting to complete the pain diary after you wake up. Instead, set your alarm clock to go off every hour, on the hour, for the initial 8 hours after the procedure. Document the duration of the numbing medicine, and the relief you are getting from it.  Do not go to sleep and attempt to complete it later. It will not be accurate. If you received sedation, it is likely that you were given a medication that may cause amnesia. Because of this, completing the diary at a later time may cause the information to be inaccurate. This information is needed to plan your care.  Follow-up appointment: Keep your post-procedure follow-up evaluation appointment after the procedure (usually 2 weeks for most procedures, 6 weeks for radiofrequencies). DO NOT FORGET to bring you pain diary with you.   Expect: (What should I expect to see with my procedure?)  From numbing medicine (AKA: Local Anesthetics): Numbness or decrease in pain. You may also experience some weakness, which if present, could last for the duration of the local anesthetic.  Onset: Full effect within 15 minutes of injected.  Duration: It will depend on the type of local anesthetic used. On the average, 1 to 8 hours.   From steroids (Applies only if steroids were used): Decrease in swelling or inflammation. Once inflammation is improved, relief of the pain will follow.  Onset of benefits: Depends on the amount of swelling present. The more swelling, the longer it will take for the benefits to be seen. In some cases, up to 10 days.    may typically last 2 weeks (the duration of the steroids). Frequent: Cramps (if they occur, drink Gatorade and take over-the-counter Magnesium 450-500 mg once to twice a day); water retention with temporary  weight gain; increases in blood sugar; decreased immune system response; increased appetite. Occasional: Facial flushing (red, warm cheeks); mood swings; menstrual changes. Uncommon: Long-term decrease or suppression of natural hormones; bone thinning. (These are more common with higher doses or more frequent use. This is why we prefer that our patients avoid having any injection therapies in other practices.)  Very Rare: Severe mood changes; psychosis; aseptic necrosis. From procedure: Some discomfort is to be expected once the numbing medicine wears off. This should be minimal if ice and heat are applied as instructed.  Call if: (When should I call?) You experience numbness and weakness that gets worse with time, as opposed to wearing off. New onset bowel or bladder incontinence. (Applies only to procedures done in the spine)  Emergency Numbers: Durning business hours (Monday - Thursday, 8:00 AM - 4:00 PM) (Friday, 9:00 AM - 12:00 Noon): [phone] After hours: [phone] NOTE: If you are having a problem and are unable connect with, or to talk to a provider, then go to your nearest urgent care or emergency department. If the problem is serious and urgent, please call 911. ____________________________________________________________________________________________  ____________________________________________________________________________________________  Patient Information update  To: All of our patients.  Re: Name change.  It has been made official that our current name, "[facility]"   will soon be changed to "[facility]".   The purpose of this change is to eliminate any confusion created by the concept of our practice being a "Medication Management Pain Clinic". In the past this has led to the misconception that we treat pain primarily by the use of prescription  medications.  Nothing can be farther from the truth.   Understanding PAIN MANAGEMENT: To further understand what our practice does, you first have to understand that "Pain Management" is a subspecialty that requires additional training once a physician has completed their specialty training, which can be in either Anesthesia, Neurology, Psychiatry, or Physical Medicine and Rehabilitation (PMR). Each one of these contributes to the final approach taken by each physician to the management of their patient's pain. To be a "Pain Management Specialist" you must have first completed one of the specialty trainings below.  Anesthesiologists - trained in clinical pharmacology and interventional techniques such as nerve blockade and regional as well as central neuroanatomy. They are trained to block pain before, during, and after surgical interventions.  Neurologists - trained in the diagnosis and pharmacological treatment of complex neurological conditions, such as Multiple Sclerosis, Parkinson's, spinal cord injuries, and other systemic conditions that may be associated with symptoms that may include but are not limited to pain. They tend to rely primarily on the treatment of chronic pain using prescription medications.  Psychiatrist - trained in conditions affecting the psychosocial wellbeing of patients including but not limited to depression, anxiety, schizophrenia, personality disorders, addiction, and other substance use disorders that may be associated with chronic pain. They tend to rely primarily on the treatment of chronic pain using prescription medications.   Physical Medicine and Rehabilitation (PMR) physicians, also known as physiatrists - trained to treat a wide variety of medical conditions affecting the brain, spinal cord, nerves, bones, joints, ligaments, muscles, and tendons. Their training is primarily aimed at treating patients that have suffered injuries   that have caused severe physical  impairment. Their training is primarily aimed at the physical therapy and rehabilitation of those patients. They may also work alongside orthopedic surgeons or neurosurgeons using their expertise in assisting surgical patients to recover after their surgeries.  INTERVENTIONAL PAIN MANAGEMENT is sub-subspecialty of Pain Management.  Our physicians are Board-certified in Anesthesia, Pain Management, and Interventional Pain Management.  This meaning that not only have they been trained and Board-certified in their specialty of Anesthesia, and subspecialty of Pain Management, but they have also received further training in the sub-subspecialty of Interventional Pain Management, in order to become Board-certified as INTERVENTIONAL PAIN MANAGEMENT SPECIALIST.    Mission: Our goal is to use our skills in  INTERVENTIONAL PAIN MANAGEMENT as alternatives to the chronic use of prescription opioid medications for the treatment of pain. To make this more clear, we have changed our name to reflect what we do and offer. We will continue to offer medication management assessment and recommendations, but we will not be taking over any patient's medication management.  ____________________________________________________________________________________________   

## 2022-02-23 ENCOUNTER — Telehealth: Payer: Self-pay | Admitting: *Deleted

## 2022-02-23 NOTE — Telephone Encounter (Signed)
No problems post procedure. 

## 2022-02-28 ENCOUNTER — Encounter: Payer: Self-pay | Admitting: Internal Medicine

## 2022-02-28 ENCOUNTER — Telehealth (INDEPENDENT_AMBULATORY_CARE_PROVIDER_SITE_OTHER): Payer: Medicare Other | Admitting: Internal Medicine

## 2022-02-28 ENCOUNTER — Other Ambulatory Visit (INDEPENDENT_AMBULATORY_CARE_PROVIDER_SITE_OTHER): Payer: Medicare Other

## 2022-02-28 ENCOUNTER — Telehealth: Payer: Self-pay | Admitting: Internal Medicine

## 2022-02-28 VITALS — Ht 67.0 in | Wt 168.0 lb

## 2022-02-28 DIAGNOSIS — R300 Dysuria: Secondary | ICD-10-CM | POA: Diagnosis not present

## 2022-02-28 DIAGNOSIS — M4696 Unspecified inflammatory spondylopathy, lumbar region: Secondary | ICD-10-CM

## 2022-02-28 DIAGNOSIS — C8590 Non-Hodgkin lymphoma, unspecified, unspecified site: Secondary | ICD-10-CM

## 2022-02-28 DIAGNOSIS — N3000 Acute cystitis without hematuria: Secondary | ICD-10-CM

## 2022-02-28 LAB — URINALYSIS, ROUTINE W REFLEX MICROSCOPIC
Bilirubin Urine: NEGATIVE
Ketones, ur: NEGATIVE
Nitrite: NEGATIVE
Specific Gravity, Urine: 1.02 (ref 1.000–1.030)
Urine Glucose: NEGATIVE
Urobilinogen, UA: 0.2 (ref 0.0–1.0)
pH: 6.5 (ref 5.0–8.0)

## 2022-02-28 MED ORDER — NITROFURANTOIN MONOHYD MACRO 100 MG PO CAPS: 100.0000 mg | ORAL_CAPSULE | Freq: Two times a day (BID) | ORAL | 0 refills | Status: DC

## 2022-02-28 NOTE — Addendum Note (Signed)
Addended by: Roetta Sessions D on: 02/28/2022 09:11 AM   Modules accepted: Orders

## 2022-02-28 NOTE — Telephone Encounter (Signed)
Patient called and thinks she has a UTI. No appointments available at office. Patient was advised that Wannetta Sender would call her sometime this morning, per Trish.

## 2022-02-28 NOTE — Telephone Encounter (Signed)
Patient having burning, frequency, urgency. Stated that she has a urine cup from our office that was given to her and was wanting to know if husband can drop off this AM. Pt is agreeable with VV this PM. Confirmed that she has access to do a virtual. Ok to place orders and have her husband drop off urine?

## 2022-02-28 NOTE — Telephone Encounter (Signed)
Yes

## 2022-02-28 NOTE — Telephone Encounter (Signed)
ORDERS PLACED FOR URINE

## 2022-02-28 NOTE — Progress Notes (Signed)
Patient ID: Maria Keller, female   DOB: 09/23/1950, 72 y.o.   MRN: [id number]   Virtual Visit via video Note  All issues noted in this document were discussed and addressed.  No physical exam was performed (except for noted visual exam findings with Video Visits).   I connected with Kristopher Glee by a video enabled telemedicine application and verified that I am speaking with the correct person using two identifiers. Location patient: home Location provider: work  Persons participating in the virtual visit: patient, provider  The limitations, risks, security and privacy concerns of performing an evaluation and management service by video and the availability of in person appointments have been discussed.  It has also been discussed with the patient that there may be a patient responsible charge related to this service. The patient expressed understanding and agreed to proceed.   Reason for visit: work in appt  HPI: Work in with concerns regarding UTI.  States symptoms started yesterday pm.  Reports noticing increased frequency and urgency.  Dysuria.  Noticed some minimal blood when wiped - blood with urine.  No vaginal bleeding.  Some abdominal pressure.  No fever.  No nausea or vomiting.  She is eating.  Has had some previous issues with congestion.  Still with some phlegm in her throat.  Taking mucinex and tussin DM.  Using flonase.  Is better.  S/p recent nerve block.     ROS: See pertinent positives and negatives per HPI.  Past Medical History:  Diagnosis Date   Anemia    iron deficiency and B12 deficiency   Arthritis    Bronchitis 08/2015   Chronic back pain    followed by Dr Primus Bravo   Depression    Diverticulosis    Esophageal stricture    requiring dilatation x 2   Fibromyalgia    Fibromyalgia    Fibromyalgia    Follicular lymphoma ([place])    Followed by Dr Leretha Pol, s/p chemo and XRT   Follicular lymphoma ([place])    GERD (gastroesophageal reflux disease)    GERD  (gastroesophageal reflux disease)    Hemorrhoid    Hiatal hernia    History of kidney stones    Hypercholesterolemia    IBS (irritable bowel syndrome)    IBS (irritable bowel syndrome)    Nephrolithiasis    followed by Dr Bernardo Heater, s/p stents   PONV (postoperative nausea and vomiting)    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   Shingles outbreak 11/26/2011   Sleep apnea     Past Surgical History:  Procedure Laterality Date   APPENDECTOMY     BLADDER SURGERY  1993   BREAST BIOPSY N/A    capsule endoscopy     CHOLECYSTECTOMY  1995   COLONOSCOPY WITH PROPOFOL N/A 06/19/2017   Procedure: COLONOSCOPY WITH PROPOFOL;  Surgeon: Manya Silvas, MD;  Location: [facility] ENDOSCOPY;  Service: Endoscopy;  Laterality: N/A;   ESOPHAGOGASTRODUODENOSCOPY (EGD) WITH PROPOFOL N/A 06/19/2017   Procedure: ESOPHAGOGASTRODUODENOSCOPY (EGD) WITH PROPOFOL;  Surgeon: Manya Silvas, MD;  Location: [facility] ENDOSCOPY;  Service: Endoscopy;  Laterality: N/A;   EYE SURGERY     both cataract remove   FRACTURE SURGERY Left    ankle, plate with 8 screws   history of helicobacter pylori infection     JOINT REPLACEMENT Right    knee   LEFT OOPHORECTOMY     LEG SURGERY Left    ORIF ANKLE FRACTURE  2012  OVARIAN CYST REMOVAL     right   RECTOCELE REPAIR N/A    REPLACEMENT TOTAL KNEE Right 03/19/2016   TONSILECTOMY/ADENOIDECTOMY WITH MYRINGOTOMY  1970   TONSILLECTOMY N/A    TOTAL KNEE REVISION Right 11/07/2021   Procedure: TOTAL KNEE REVISION;  Surgeon: Corky Mull, MD;  Location: [facility] ORS;  Service: Orthopedics;  Laterality: Right;   TUBAL LIGATION  1976   VAGINAL HYSTERECTOMY  1980's   secondary to bleeding    Family History  Problem Relation Age of Onset   Ulcers Mother    Ulcers Father    Breast cancer Other        great aunt and grandfather   Lung cancer Other        uncle   Hypertension Maternal Grandmother    Heart disease Maternal Grandmother         MI 78s   Diabetes Maternal Grandmother    Arthritis Maternal Grandmother    Diabetes Maternal Grandfather     SOCIAL HX: reviewed.    Current Outpatient Medications:    acetaminophen (TYLENOL) 650 MG CR tablet, Take 650 mg by mouth every 8 (eight) hours as needed for pain., Disp: , Rfl:    aspirin EC 81 MG tablet, Take 81 mg by mouth daily., Disp: , Rfl:    atorvastatin (LIPITOR) 10 MG tablet, TAKE 1 TABLET BY MOUTH DAILY., Disp: 30 tablet, Rfl: 2   azelastine (ASTELIN) 0.1 % nasal spray, 1 SPRAY IN EACH NOSTRIL TWICE DAILY AS DIRECTED, Disp: 30 mL, Rfl: 1   calcium carbonate (OS-CAL) 1250 (500 Ca) MG chewable tablet, Chew 1 tablet by mouth daily., Disp: , Rfl:    calcium carbonate (TUMS - DOSED IN MG ELEMENTAL CALCIUM) 500 MG chewable tablet, Chew 1 tablet by mouth as needed., Disp: , Rfl:    cetirizine (ZYRTEC) 10 MG tablet, Take by mouth., Disp: , Rfl:    cholecalciferol (VITAMIN D3) 25 MCG (1000 UNIT) tablet, Take 1,000 Units by mouth daily., Disp: , Rfl:    estradiol (ESTRACE) 0.5 MG tablet, TAKE 1 TABLET BY MOUTH DAILY., Disp: 90 tablet, Rfl: 1   fluticasone (FLONASE) 50 MCG/ACT nasal spray, Place 2 sprays into both nostrils daily., Disp: 48 g, Rfl: 3   furosemide (LASIX) 20 MG tablet, TAKE 1 TABLET BY MOUTH DAILY AS NEEDED., Disp: 90 tablet, Rfl: 0   Multiple Vitamin (MULTIVITAMIN) tablet, Take 1 tablet by mouth daily., Disp: , Rfl:    mupirocin ointment (BACTROBAN) 2 %, Apply 1 Application topically 2 (two) times daily., Disp: 22 g, Rfl: 0   nitrofurantoin, macrocrystal-monohydrate, (MACROBID) 100 MG capsule, Take 1 capsule (100 mg total) by mouth 2 (two) times daily., Disp: 10 capsule, Rfl: 0   omeprazole (PRILOSEC) 20 MG capsule, TAKE 1 CAPSULE BY MOUTH TWO TIMES DAILY, Disp: 180 capsule, Rfl: 2   ondansetron (ZOFRAN) 4 MG tablet, Take 1 tablet (4 mg total) by mouth every 6 (six) hours as needed for nausea., Disp: 30 tablet, Rfl: 0   senna-docusate (SENOKOT-S) 8.6-50 MG per  tablet, Take by mouth daily., Disp: , Rfl:   EXAM:  GENERAL: alert, oriented, appears well and in no acute distress  HEENT: atraumatic, conjunttiva clear, no obvious abnormalities on inspection of external nose and ears  NECK: normal movements of the head and neck  LUNGS: on inspection no signs of respiratory distress, breathing rate appears normal, no obvious gross SOB, gasping or wheezing  CV: no obvious cyanosis  PSYCH/NEURO: pleasant and cooperative, no obvious depression or  anxiety, speech and thought processing grossly intact  ASSESSMENT AND PLAN:  Discussed the following assessment and plan:  Problem List Items Addressed This Visit     UTI (urinary tract infection) - Primary    Symptoms and urinalysis appear to be c/w UTI.  Treat with macrobid as directed.  Stay hydrated.  Follow.  Call with update.  Await urine culture results.  Discussed if recurring infections, further w/up.       Relevant Medications   nitrofurantoin, macrocrystal-monohydrate, (MACROBID) 100 MG capsule   Non-Hodgkin's lymphoma (HCC) (Chronic)    Followed by Dr Leretha Pol.  Stable.  Follows up yearly.       Relevant Medications   nitrofurantoin, macrocrystal-monohydrate, (MACROBID) 100 MG capsule   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[facility])    Chronic back pain.  Followed by pain clinic. S/p recent nerve block.        Return if symptoms worsen or fail to improve.   I discussed the assessment and treatment plan with the patient. The patient was provided an opportunity to ask questions and all were answered. The patient agreed with the plan and demonstrated an understanding of the instructions.   The patient was advised to call back or seek an in-person evaluation if the symptoms worsen or if the condition fails to improve as anticipated.   Einar Pheasant, MD

## 2022-03-02 LAB — URINE CULTURE
MICRO NUMBER:: 14467072
SPECIMEN QUALITY:: ADEQUATE

## 2022-03-04 NOTE — Assessment & Plan Note (Signed)
Chronic back pain.  Followed by pain clinic. S/p recent nerve block.

## 2022-03-04 NOTE — Assessment & Plan Note (Signed)
Followed by Dr Leretha Pol.  Stable.  Follows up yearly.

## 2022-03-04 NOTE — Assessment & Plan Note (Signed)
Symptoms and urinalysis appear to be c/w UTI.  Treat with macrobid as directed.  Stay hydrated.  Follow.  Call with update.  Await urine culture results.  Discussed if recurring infections, further w/up.

## 2022-03-06 NOTE — Telephone Encounter (Signed)
Please call and confirm no other symptoms.  Ok to continue, but if persistent symptoms, will need to be evaluated.

## 2022-03-07 NOTE — Telephone Encounter (Signed)
Pt advised If drainage does not improve, will call for eval appt

## 2022-03-08 ENCOUNTER — Ambulatory Visit: Payer: Medicare Other | Admitting: Pain Medicine

## 2022-03-11 NOTE — Progress Notes (Unsigned)
PROVIDER NOTE: Information contained herein reflects review and annotations entered in association with encounter. Interpretation of such information and data should be left to medically-trained personnel. Information provided to patient can be located elsewhere in the medical record under "Patient Instructions". Document created using STT-dictation technology, any transcriptional errors that may result from process are unintentional.    Patient: Maria Keller  Service Category: E/M  Provider: Gaspar Cola, MD  DOB: [birth year]  DOS: 03/13/2022  Referring Provider: Einar Pheasant, MD  MRN: [id number]  Specialty: Interventional Pain Management  PCP: Einar Pheasant, MD  Type: Established Patient  Setting: Ambulatory outpatient    Location: Office  Delivery: Face-to-face     HPI  [name], a 72 y.o. year old female, is here today because of her No primary diagnosis found.. Ms. Revard primary complain today is No chief complaint on file. Last encounter: My last encounter with her was on 02/22/2022. Pertinent problems: Ms. 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, right; History of shingles; History of urinary stone; Intractable migraine with aura without status migrainosus; Myalgia and myositis; Osteoarthritis; Primary osteoarthritis of right knee; History of total knee replacement (Right); Lymphoma ([place]); Chronic pain syndrome; Chronic thigh pain (2ry area of  Pain) (Bilateral) (R>L); Chronic knee pain after total replacement (3ry area of Pain) (Right); Chronic shoulder pain (4th area of Pain) (Bilateral) (R>L); Status post revision of total knee replacement, right; Osteoarthritis of acromioclavicular joints (Bilateral); Tendinopathy of rotator cuff (Bilateral); Shoulder enthesopathy (Bilateral); 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 Pain Assessment: Severity of   is reported as a  /10. Location:    / . Onset:  . Quality:  . Timing:  . Modifying factor(s):  Marland Kitchen Vitals:  vitals were not taken for this visit.  BMI: Estimated body mass index is 26.31 kg/m as calculated from the following:   Height as of 02/28/22: '5\' 7"'$  (1.702 m).   Weight as of 02/28/22: 168 lb (76.2 kg).  Reason for encounter: post-procedure evaluation and assessment. ***  Post-procedure evaluation   Type: Lumbar Facet, Medial Branch Block(s) #1  Laterality: Bilateral  Level: L3, L4, L5, and S1 Medial Branch Level(s). Injecting these levels blocks the L4-5 and L5-S1 lumbar facet joints. Imaging: Fluoroscopic guidance         Anesthesia: Local anesthesia (1-2% Lidocaine) Anxiolysis: IV Versed 2.0 mg Sedation: Moderate Sedation                       DOS: 02/22/2022 Performed by: Gaspar Cola, MD  Primary Purpose: Diagnostic/Therapeutic Indications: Low back pain severe enough to impact quality of life or function. 1. Lumbar facet syndrome   2. Spondylosis without myelopathy or radiculopathy, lumbosacral region   3. Grade 1 Anterolisthesis of lumbosacral spine of L5/S1   4. Lumbosacral facet arthropathy   5. DDD (degenerative disc disease), lumbosacral   6. Chronic low  back pain (1ry area  of Pain) (Bilateral) (R>L) w/o sciatica    NAS-11 Pain score:   Pre-procedure: 6 /10   Post-procedure: 2 /10      Effectiveness:  Initial hour after procedure:   ***. Subsequent 4-6 hours post-procedure:   ***. Analgesia past initial 6 hours:   ***. Ongoing improvement:  Analgesic:  *** Function:    ***    ROM:    ***     Pharmacotherapy Assessment  Analgesic: Oxycodone IR 5 mg tablet, 1 tab p.o. twice daily.  (Still tapering down on 02/14/2022). MME/day: Down to 15 mg/day from 487.5 mg/day   Monitoring: [place] PMP: PDMP reviewed during this encounter.       Pharmacotherapy: No side-effects or adverse reactions reported. Compliance: No problems identified. Effectiveness: Clinically acceptable.  No notes on file  No results found for: "CBDTHCR" No results found for: "D8THCCBX" No results found for: "D9THCCBX"  UDS:  Summary  Date Value Ref Range Status  02/14/2022 Note  Final    Comment:    ==================================================================== [employer] 13 (MW) ==================================================================== Test                             Result       Flag       Units  Drug Present and Declared for Prescription Verification   Oxycodone                      2285         EXPECTED   ng/mg creat   Oxymorphone                    77           EXPECTED   ng/mg creat   Noroxycodone                   >2967        EXPECTED   ng/mg creat   Noroxymorphone                 54           EXPECTED   ng/mg creat    Sources of oxycodone are scheduled prescription medications.    Oxymorphone, noroxycodone, and noroxymorphone are expected    metabolites of oxycodone. Oxymorphone is also available as a    scheduled prescription medication.  Drug Absent but Declared for Prescription Verification   Fentanyl                       Not Detected UNEXPECTED ng/mg creat ==================================================================== Test                       Result    Flag   Units      Ref Range   Creatinine              337              mg/dL      >=20 ==================================================================== Declared Medications:  The flagging and interpretation on this report are based on the  following declared medications.  Unexpected results may arise from  inaccuracies in the declared medications.   **Note: The testing scope of this panel includes these medications:   Fentanyl (Duragesic)  Oxycodone (Roxicodone)   **Note: The testing scope of this panel does not include the  following reported  medications:   Acetaminophen (Tylenol)  Aspirin  Atorvastatin (Lipitor)  Azelastine (Astelin)  Calcium  Cetirizine (Zyrtec)  Estradiol (Estrace)  Fluticasone (Flonase)  Furosemide (Lasix)  Multivitamin  Mupirocin (Bactroban)  Naloxone (Narcan)  Omeprazole (Prilosec)  Ondansetron (Zofran)  Prednisone (Deltasone)  Sennosides (Senokot)  Vitamin D3 ==================================================================== For clinical consultation, please call [phone]. ====================================================================       ROS  Constitutional: Denies any fever or chills Gastrointestinal: No reported hemesis, hematochezia, vomiting, or acute GI distress Musculoskeletal: Denies any acute onset joint swelling, redness, loss of ROM, or weakness Neurological: No reported episodes of acute onset apraxia, aphasia, dysarthria, agnosia, amnesia, paralysis, loss of coordination, or loss of consciousness  Medication Review  acetaminophen, aspirin EC, atorvastatin, azelastine, calcium carbonate, cetirizine, cholecalciferol, estradiol, fluticasone, furosemide, multivitamin, mupirocin ointment, nitrofurantoin (macrocrystal-monohydrate), omeprazole, ondansetron, and senna-docusate  History Review  Allergy: Ms. Vignola is allergic to oxycodone hcl, oxycontin [oxycodone hcl], penicillins, chloral hydrate,  ciprofloxacin, ciprofloxacin hcl, colace [docusate calcium], methotrexate derivatives, parafon forte dsc [chlorzoxazone], penicillin g, tape, bextra [valdecoxib], oxycodone, and rofecoxib. Drug: Ms. Wickham  reports no history of drug use. Alcohol:  reports no history of alcohol use. Tobacco:  reports that she has never smoked. She has never used smokeless tobacco. Social: Ms. Lebon  reports that she has never smoked. She has never used smokeless tobacco. She reports that she does not drink alcohol and does not use drugs. Medical:  has a past medical history of Anemia, Arthritis, Bronchitis (08/2015), Chronic back pain, Depression, Diverticulosis, Esophageal stricture, Fibromyalgia, Fibromyalgia, Fibromyalgia, Follicular lymphoma ([place]), Follicular lymphoma ([place]), GERD (gastroesophageal reflux disease), GERD (gastroesophageal reflux disease), Hemorrhoid, Hiatal hernia, History of kidney stones, Hypercholesterolemia, IBS (irritable bowel syndrome), IBS (irritable bowel syndrome), Nephrolithiasis, PONV (postoperative nausea and vomiting), Shingles outbreak (11/26/2011), and Sleep apnea. Surgical: Ms. Wimer  has a past surgical history that includes Tonsilectomy/adenoidectomy with myringotomy (1970); Appendectomy; Tubal ligation (1976); Vaginal hysterectomy (1980's); Left oophorectomy; Ovarian cyst removal; Bladder surgery (1993); Cholecystectomy (1995); ORIF ankle fracture (2012); Rectocele repair (N/A); Breast biopsy (N/A); Leg Surgery (Left); Tonsillectomy (N/A); Replacement total knee (Right, 27/04/5007); history of helicobacter pylori infection; capsule endoscopy; Joint replacement (Right); Fracture surgery (Left); Esophagogastroduodenoscopy (egd) with propofol (N/A, 06/19/2017); Colonoscopy with propofol (N/A, 06/19/2017); Eye surgery; and Total knee revision (Right, 11/07/2021). Family: family history includes Arthritis in her maternal grandmother; Breast cancer in an other family member; Diabetes in her  maternal grandfather and maternal grandmother; Heart disease in her maternal grandmother; Hypertension in her maternal grandmother; Lung cancer in an other family member; Ulcers in her father and mother.  Laboratory Chemistry Profile   Renal Lab Results  Component Value Date   BUN 14 12/04/2021   CREATININE 0.64 12/04/2021   BCR 13 10/12/2021   GFR 87.92 12/04/2021   GFRAA >60 11/06/2012   GFRNONAA >60 11/08/2021    Hepatic Lab Results  Component Value Date   AST 27 12/04/2021   ALT 14 12/04/2021   ALBUMIN 3.7 12/04/2021   ALKPHOS 102 12/04/2021   HCVAB NEGATIVE 05/04/2015    Electrolytes Lab Results  Component Value Date   NA 140 12/04/2021   K 4.4 12/04/2021   CL 104 12/04/2021   CALCIUM 8.7 12/04/2021   MG 2.4 (H) 10/12/2021    Bone Lab Results  Component Value Date   25OHVITD1 32 10/12/2021   25OHVITD2 <1.0 10/12/2021   25OHVITD3 32 10/12/2021    Inflammation (CRP: Acute Phase) (ESR: Chronic Phase) Lab Results  Component Value Date   CRP 3 10/12/2021  ESRSEDRATE 24 10/12/2021         Note: Above Lab results reviewed.  Recent Imaging Review  DG PAIN CLINIC C-ARM 1-60 MIN NO REPORT Fluoro was used, but no Radiologist interpretation will be provided.  Please refer to "NOTES" tab for provider progress note. Note: Reviewed        Physical Exam  General appearance: Well nourished, well developed, and well hydrated. In no apparent acute distress Mental status: Alert, oriented x 3 (person, place, & time)       Respiratory: No evidence of acute respiratory distress Eyes: PERLA Vitals: There were no vitals taken for this visit. BMI: Estimated body mass index is 26.31 kg/m as calculated from the following:   Height as of 02/28/22: '5\' 7"'$  (1.702 m).   Weight as of 02/28/22: 168 lb (76.2 kg). Ideal: Ideal body weight: 61.6 kg (135 lb 12.9 oz) Adjusted ideal body weight: 67.4 kg (148 lb 10.9 oz)  Assessment   Diagnosis Status  No diagnosis found.  Controlled Controlled Controlled   Updated Problems: No problems updated.  Plan of Care  Problem-specific:  No problem-specific Assessment & Plan notes found for this encounter.  Ms. BELVA KOZIEL has a current medication list which includes the following long-term medication(s): atorvastatin, azelastine, calcium carbonate, cetirizine, estradiol, fluticasone, furosemide, and omeprazole.  Pharmacotherapy (Medications Ordered): No orders of the defined types were placed in this encounter.  Orders:  No orders of the defined types were placed in this encounter.  Follow-up plan:   No follow-ups on file.     Interventional Therapies  Risk  Complexity Considerations:      Planned  Pending:   Diagnostic bilateral lumbar facet (L4-5, L5-S1) MBB #1    Under consideration:   Diagnostic bilateral lumbar facet (L4-5, L5-S1) MBB #1    Completed:   None at this time   Completed by other providers:   right ultrasound-guided pes anserine bursa injection (01/13/2021) by Rosalia Hammers, DO  Right total knee replacement (03/19/2016) by Franchot Mimes, MD  Right knee arthroscopy (05/22/2013) by Franchot Mimes, MD  Midline L4-5 LESI (10/18/2014) by Dr. Mohammed Kindle    Therapeutic  Palliative (PRN) options:   None established   Pharmacotherapy  2D6 Metabolic Activity: Intermediate 3Z85 Metabolic Activity: Normal 3A4 Metabolic Activity: Normal 3A5 Metabolic Activity: Poor patient of Dr. Mohammed Kindle who had her on opioid analgesics totaling 487.5 MME/day by 10/12/2021.  Slow taper performed and by 02/14/2022 the patient was still on the taper but she was down to 15 MME/day.  The goal is to have the patient completely off of the narcotics and control the pain with interventional therapies.        Recent Visits Date Type Provider Dept  02/22/22 Procedure visit Milinda Pointer, MD [facility]  02/14/22 Office Visit Milinda Pointer, MD [facility]  12/25/21  Office Visit Milinda Pointer, MD [facility]  Showing recent visits within past 90 days and meeting all other requirements Future Appointments Date Type Provider Dept  03/13/22 Appointment Milinda Pointer, MD [facility]  Showing future appointments within next 90 days and meeting all other requirements  I discussed the assessment and treatment plan with the patient. The patient was provided an opportunity to ask questions and all were answered. The patient agreed with the plan and demonstrated an understanding of the instructions.  Patient advised to call back or seek an in-person evaluation if the symptoms or condition worsens.  Duration of encounter: ***  minutes.  Total time on encounter, as per AMA guidelines included both the face-to-face and non-face-to-face time personally spent by the physician and/or other qualified health care professional(s) on the day of the encounter (includes time in activities that require the physician or other qualified health care professional and does not include time in activities normally performed by clinical staff). Physician's time may include the following activities when performed: Preparing to see the patient (e.g., pre-charting review of records, searching for previously ordered imaging, lab work, and nerve conduction tests) Review of prior analgesic pharmacotherapies. Reviewing PMP Interpreting ordered tests (e.g., lab work, imaging, nerve conduction tests) Performing post-procedure evaluations, including interpretation of diagnostic procedures Obtaining and/or reviewing separately obtained history Performing a medically appropriate examination and/or evaluation Counseling and educating the patient/family/caregiver Ordering medications, tests, or procedures Referring and communicating with other health care professionals (when not separately reported) Documenting clinical information in the electronic or other health  record Independently interpreting results (not separately reported) and communicating results to the patient/ family/caregiver Care coordination (not separately reported)  Note by: Gaspar Cola, MD Date: 03/13/2022; Time: 1:17 PM

## 2022-03-13 ENCOUNTER — Encounter: Payer: Self-pay | Admitting: Pain Medicine

## 2022-03-13 ENCOUNTER — Ambulatory Visit: Payer: Medicare Other | Attending: Pain Medicine | Admitting: Pain Medicine

## 2022-03-13 VITALS — BP 127/74 | HR 101 | Temp 97.2°F | Resp 18 | Ht 67.0 in | Wt 168.0 lb

## 2022-03-13 DIAGNOSIS — M47816 Spondylosis without myelopathy or radiculopathy, lumbar region: Secondary | ICD-10-CM | POA: Insufficient documentation

## 2022-03-13 DIAGNOSIS — G8929 Other chronic pain: Secondary | ICD-10-CM | POA: Insufficient documentation

## 2022-03-13 DIAGNOSIS — M545 Low back pain, unspecified: Secondary | ICD-10-CM

## 2022-03-13 DIAGNOSIS — M47817 Spondylosis without myelopathy or radiculopathy, lumbosacral region: Secondary | ICD-10-CM | POA: Diagnosis not present

## 2022-03-13 NOTE — Progress Notes (Signed)
Safety precautions to be maintained throughout the outpatient stay will include: orient to surroundings, keep bed in low position, maintain call bell within reach at all times, provide assistance with transfer out of bed and ambulation.   Nursing Pain Medication Assessment:  Safety precautions to be maintained throughout the outpatient stay will include: orient to surroundings, keep bed in low position, maintain call bell within reach at all times, provide assistance with transfer out of bed and ambulation.  Medication Inspection Compliance: Pill count conducted under aseptic conditions, in front of the patient. Neither the pills nor the bottle was removed from the patient's sight at any time. Once count was completed pills were immediately returned to the patient in their original bottle.  Medication: Oxycodone IR Pill/Patch Count:  64 of 120 pills remain Pill/Patch Appearance: Markings consistent with prescribed medication Bottle Appearance: Standard pharmacy container. Clearly labeled. Filled Date: 76 / 10 / 2023  Last Medication intake:    Last took January 20th. Plans to no longer continue taking Oxycodone. Oxycodone discarded today 03/13/22 at the [place] with D. Wheatly, RN and patient served as a witness.   Al Decant, RN

## 2022-03-13 NOTE — Patient Instructions (Signed)

## 2022-03-20 ENCOUNTER — Encounter: Payer: Self-pay | Admitting: Pain Medicine

## 2022-03-20 ENCOUNTER — Ambulatory Visit: Payer: Medicare Other | Attending: Pain Medicine | Admitting: Pain Medicine

## 2022-03-20 ENCOUNTER — Ambulatory Visit
Admission: RE | Admit: 2022-03-20 | Discharge: 2022-03-20 | Disposition: A | Discharge status: Home / Self Care | Payer: Medicare Other | Source: Ambulatory Visit | Attending: Pain Medicine | Admitting: Pain Medicine

## 2022-03-20 VITALS — BP 134/60 | HR 70 | Temp 97.8°F | Resp 16 | Ht 67.0 in | Wt 168.0 lb

## 2022-03-20 DIAGNOSIS — M47817 Spondylosis without myelopathy or radiculopathy, lumbosacral region: Secondary | ICD-10-CM

## 2022-03-20 DIAGNOSIS — M5137 Other intervertebral disc degeneration, lumbosacral region: Secondary | ICD-10-CM | POA: Diagnosis not present

## 2022-03-20 DIAGNOSIS — M47816 Spondylosis without myelopathy or radiculopathy, lumbar region: Secondary | ICD-10-CM

## 2022-03-20 DIAGNOSIS — M4317 Spondylolisthesis, lumbosacral region: Secondary | ICD-10-CM

## 2022-03-20 DIAGNOSIS — M51379 Other intervertebral disc degeneration, lumbosacral region without mention of lumbar back pain or lower extremity pain: Secondary | ICD-10-CM

## 2022-03-20 DIAGNOSIS — M4696 Unspecified inflammatory spondylopathy, lumbar region: Secondary | ICD-10-CM

## 2022-03-20 DIAGNOSIS — M545 Low back pain, unspecified: Secondary | ICD-10-CM

## 2022-03-20 DIAGNOSIS — Z792 Long term (current) use of antibiotics: Secondary | ICD-10-CM

## 2022-03-20 DIAGNOSIS — Z96651 Presence of right artificial knee joint: Secondary | ICD-10-CM | POA: Diagnosis not present

## 2022-03-20 DIAGNOSIS — G8929 Other chronic pain: Secondary | ICD-10-CM | POA: Insufficient documentation

## 2022-03-20 MED ORDER — LIDOCAINE HCL 2 % IJ SOLN
20.0000 mL | Freq: Once | INTRAMUSCULAR | Status: AC
  Administered 2022-03-20: 400 mg
  Filled 2022-03-20: qty 40

## 2022-03-20 MED ORDER — PENTAFLUOROPROP-TETRAFLUOROETH EX AERO
INHALATION_SPRAY | Freq: Once | CUTANEOUS | Status: AC
  Administered 2022-03-20: 30 via TOPICAL

## 2022-03-20 MED ORDER — LACTATED RINGERS IV SOLN: Freq: Once | INTRAVENOUS | Status: AC

## 2022-03-20 MED ORDER — MIDAZOLAM HCL 2 MG/2ML IJ SOLN
0.5000 mg | Freq: Once | INTRAMUSCULAR | Status: AC
  Administered 2022-03-20: 2 mg via INTRAVENOUS
  Filled 2022-03-20: qty 2

## 2022-03-20 MED ORDER — VANCOMYCIN HCL IN DEXTROSE 1-5 GM/200ML-% IV SOLN
1000.0000 mg | Freq: Once | INTRAVENOUS | Status: AC
  Administered 2022-03-20: 1000 mg via INTRAVENOUS
  Filled 2022-03-20: qty 200

## 2022-03-20 MED ORDER — ROPIVACAINE HCL 2 MG/ML IJ SOLN
18.0000 mL | Freq: Once | INTRAMUSCULAR | Status: AC
  Administered 2022-03-20: 18 mL via PERINEURAL
  Filled 2022-03-20: qty 20

## 2022-03-20 MED ORDER — TRIAMCINOLONE ACETONIDE 40 MG/ML IJ SUSP
80.0000 mg | Freq: Once | INTRAMUSCULAR | Status: AC
  Administered 2022-03-20: 80 mg
  Filled 2022-03-20: qty 2

## 2022-03-20 NOTE — Progress Notes (Addendum)
PROVIDER NOTE: Interpretation of information contained herein should be left to medically-trained personnel. Specific patient instructions are provided elsewhere under "Patient Instructions" section of medical record. This document was created in part using STT-dictation technology, any transcriptional errors that may result from this process are unintentional.  Patient: Maria Keller Type: Established DOB: 04/07/1950 MRN: [id number] PCP: Einar Pheasant, MD  Service: Procedure DOS: 03/20/2022 Setting: Ambulatory Location: Ambulatory outpatient facility Delivery: Face-to-face Provider: Gaspar Cola, MD Specialty: Interventional Pain Management Specialty designation: 09 Location: Outpatient facility Ref. Prov.: Milinda Pointer, MD       Interventional Therapy   Procedure: Lumbar Facet, Medial Branch Block(s) #2  Laterality: Bilateral  Level: L3, L4, L5, and S1 Medial Branch Level(s). Injecting these levels blocks the L4-5 and L5-S1 lumbar facet joints. Imaging: Fluoroscopic guidance         Anesthesia: Local anesthesia (1-2% Lidocaine) Anxiolysis: IV Versed 2.0 mg Sedation: No Sedation                       DOS: 03/20/2022 Performed by: Gaspar Cola, MD  Primary Purpose: Diagnostic/Therapeutic Indications: Low back pain severe enough to impact quality of life or function. 1. Lumbar facet syndrome   2. Grade 1 Anterolisthesis of lumbosacral spine of L5/S1   3. Spondylosis without myelopathy or radiculopathy, lumbosacral region   4. Chronic low back pain (1ry area of Pain) (Bilateral) (R>L) w/o sciatica   5. Lumbosacral facet arthropathy   6. DDD (degenerative disc disease), lumbosacral   7. 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 History of total knee replacement (Right)   9. Need for prophylactic antibiotic    NAS-11 Pain score:   Pre-procedure: 5 /10   Post-procedure: 0-No pain/10     Position / Prep / Materials:  Position: Prone  Prep  solution: DuraPrep (Iodine Povacrylex [0.7% available iodine] and Isopropyl Alcohol, 74% w/w) Area Prepped: Posterolateral Lumbosacral Spine (Wide prep: From the lower border of the scapula down to the end of the tailbone and from flank to flank.)  Materials:  Tray: Block Needle(s):  Type: Spinal  Gauge (G): 22  Length: 3.5-in Qty: 3      Pre-op H&P Assessment:  Maria Keller is a 72 y.o. (year old), female patient, seen today for interventional treatment. She  has a past surgical history that includes Tonsilectomy/adenoidectomy with myringotomy (1970); Appendectomy; Tubal ligation (1976); Vaginal hysterectomy (1980's); Left oophorectomy; Ovarian cyst removal; Bladder surgery (1993); Cholecystectomy (1995); ORIF ankle fracture (2012); Rectocele repair (N/A); Breast biopsy (N/A); Leg Surgery (Left); Tonsillectomy (N/A); Replacement total knee (Right, [id number]); history of helicobacter pylori infection; capsule endoscopy; Joint replacement (Right); Fracture surgery (Left); Esophagogastroduodenoscopy (egd) with propofol (N/A, 06/19/2017); Colonoscopy with propofol (N/A, 06/19/2017); Eye surgery; and Total knee revision (Right, 11/07/2021). Maria Keller has a current medication list which includes the following prescription(s): acetaminophen, aspirin ec, atorvastatin, azelastine, calcium carbonate, cetirizine, cholecalciferol, estradiol, fluticasone, furosemide, multivitamin, mupirocin ointment, omeprazole, and senna-docusate. Her primarily concern today is the Back Pain (lower)  Initial Vital Signs:  Pulse/HCG Rate: 80ECG Heart Rate: 85 Temp: 97.8 F (36.6 C) Resp: 16 BP: 139/67 SpO2: 99 %  BMI: Estimated body mass index is 26.31 kg/m as calculated from the following:   Height as of this encounter: '5\' 7"'$  (1.702 m).   Weight as of this encounter: 168 lb (76.2 kg).  Risk Assessment: Allergies: Reviewed. She is allergic to oxycodone hcl, oxycontin [oxycodone hcl], penicillins, chloral hydrate,  ciprofloxacin, ciprofloxacin hcl, colace [docusate calcium],  methotrexate derivatives, parafon forte dsc [chlorzoxazone], penicillin g, tape, bextra [valdecoxib], oxycodone, and rofecoxib.  Allergy Precautions: None required Coagulopathies: Reviewed. None identified.  Blood-thinner therapy: None at this time Active Infection(s): Reviewed. None identified. Maria Keller is afebrile  Site Confirmation: Maria Keller was asked to confirm the procedure and laterality before marking the site Procedure checklist: Completed Consent: Before the procedure and under the influence of no sedative(s), amnesic(s), or anxiolytics, the patient was informed of the treatment options, risks and possible complications. To fulfill our ethical and legal obligations, as recommended by the American Medical Association's Code of Ethics, I have informed the patient of my clinical impression; the nature and purpose of the treatment or procedure; the risks, benefits, and possible complications of the intervention; the alternatives, including doing nothing; the risk(s) and benefit(s) of the alternative treatment(s) or procedure(s); and the risk(s) and benefit(s) of doing nothing. The patient was provided information about the general risks and possible complications associated with the procedure. These may include, but are not limited to: failure to achieve desired goals, infection, bleeding, organ or nerve damage, allergic reactions, paralysis, and death. In addition, the patient was informed of those risks and complications associated to Spine-related procedures, such as failure to decrease pain; infection (i.e.: Meningitis, epidural or intraspinal abscess); bleeding (i.e.: epidural hematoma, subarachnoid hemorrhage, or any other type of intraspinal or peri-dural bleeding); organ or nerve damage (i.e.: Any type of peripheral nerve, nerve root, or spinal cord injury) with subsequent damage to sensory, motor, and/or autonomic systems, resulting  in permanent pain, numbness, and/or weakness of one or several areas of the body; allergic reactions; (i.e.: anaphylactic reaction); and/or death. Furthermore, the patient was informed of those risks and complications associated with the medications. These include, but are not limited to: allergic reactions (i.e.: anaphylactic or anaphylactoid reaction(s)); adrenal axis suppression; blood sugar elevation that in diabetics may result in ketoacidosis or comma; water retention that in patients with history of congestive heart failure may result in shortness of breath, pulmonary edema, and decompensation with resultant heart failure; weight gain; swelling or edema; medication-induced neural toxicity; particulate matter embolism and blood vessel occlusion with resultant organ, and/or nervous system infarction; and/or aseptic necrosis of one or more joints. Finally, the patient was informed that Medicine is not an exact science; therefore, there is also the possibility of unforeseen or unpredictable risks and/or possible complications that may result in a catastrophic outcome. The patient indicated having understood very clearly. We have given the patient no guarantees and we have made no promises. Enough time was given to the patient to ask questions, all of which were answered to the patient's satisfaction. Ms. Mercil has indicated that she wanted to continue with the procedure. Attestation: I, the ordering provider, attest that I have discussed with the patient the benefits, risks, side-effects, alternatives, likelihood of achieving goals, and potential problems during recovery for the procedure that I have provided informed consent. Date  Time: 03/20/2022 11:21 AM   Pre-Procedure Preparation:  Monitoring: As per clinic protocol. Respiration, ETCO2, SpO2, BP, heart rate and rhythm monitor placed and checked for adequate function Safety Precautions: Patient was assessed for positional comfort and pressure points  before starting the procedure. Time-out: I initiated and conducted the "Time-out" before starting the procedure, as per protocol. The patient was asked to participate by confirming the accuracy of the "Time Out" information. Verification of the correct person, site, and procedure were performed and confirmed by me, the nursing staff, and the patient. "Time-out" conducted as  per Joint Commission's Universal Protocol (UP.01.01.01). Time: 1148  Description of Procedure:          Laterality: (see above) Targeted Levels: (see above)  Safety Precautions: Aspiration looking for blood return was conducted prior to all injections. At no point did we inject any substances, as a needle was being advanced. Before injecting, the patient was told to immediately notify me if she was experiencing any new onset of "ringing in the ears, or metallic taste in the mouth". No attempts were made at seeking any paresthesias. Safe injection practices and needle disposal techniques used. Medications properly checked for expiration dates. SDV (single dose vial) medications used. After the completion of the procedure, all disposable equipment used was discarded in the proper designated medical waste containers. Local Anesthesia: Protocol guidelines were followed. The patient was positioned over the fluoroscopy table. The area was prepped in the usual manner. The time-out was completed. The target area was identified using fluoroscopy. A 12-in long, straight, sterile hemostat was used with fluoroscopic guidance to locate the targets for each level blocked. Once located, the skin was marked with an approved surgical skin marker. Once all sites were marked, the skin (epidermis, dermis, and hypodermis), as well as deeper tissues (fat, connective tissue and muscle) were infiltrated with a small amount of a short-acting local anesthetic, loaded on a 10cc syringe with a 25G, 1.5-in  Needle. An appropriate amount of time was allowed for local  anesthetics to take effect before proceeding to the next step. Local Anesthetic: Lidocaine 2.0% The unused portion of the local anesthetic was discarded in the proper designated containers. Technical description of process:  L2 Medial Branch Nerve Block (MBB): The target area for the L2 medial branch is at the junction of the postero-lateral aspect of the superior articular process and the superior, posterior, and medial edge of the transverse process of L3. Under fluoroscopic guidance, a Quincke needle was inserted until contact was made with os over the superior postero-lateral aspect of the pedicular shadow (target area). After negative aspiration for blood, 0.5 mL of the nerve block solution was injected without difficulty or complication. The needle was removed intact. L3 Medial Branch Nerve Block (MBB): The target area for the L3 medial branch is at the junction of the postero-lateral aspect of the superior articular process and the superior, posterior, and medial edge of the transverse process of L4. Under fluoroscopic guidance, a Quincke needle was inserted until contact was made with os over the superior postero-lateral aspect of the pedicular shadow (target area). After negative aspiration for blood, 0.5 mL of the nerve block solution was injected without difficulty or complication. The needle was removed intact. L4 Medial Branch Nerve Block (MBB): The target area for the L4 medial branch is at the junction of the postero-lateral aspect of the superior articular process and the superior, posterior, and medial edge of the transverse process of L5. Under fluoroscopic guidance, a Quincke needle was inserted until contact was made with os over the superior postero-lateral aspect of the pedicular shadow (target area). After negative aspiration for blood, 0.5 mL of the nerve block solution was injected without difficulty or complication. The needle was removed intact. L5 Medial Branch Nerve Block (MBB):  The target area for the L5 medial branch is at the junction of the postero-lateral aspect of the superior articular process and the superior, posterior, and medial edge of the sacral ala. Under fluoroscopic guidance, a Quincke needle was inserted until contact was made with os over the  superior postero-lateral aspect of the pedicular shadow (target area). After negative aspiration for blood, 0.5 mL of the nerve block solution was injected without difficulty or complication. The needle was removed intact. S1 Medial Branch Nerve Block (MBB): The target area for the S1 medial branch is at the posterior and inferior 6 o'clock position of the L5-S1 facet joint. Under fluoroscopic guidance, the Quincke needle inserted for the L5 MBB was redirected until contact was made with os over the inferior and postero aspect of the sacrum, at the 6 o' clock position under the L5-S1 facet joint (Target area). After negative aspiration for blood, 0.5 mL of the nerve block solution was injected without difficulty or complication. The needle was removed intact.  Once the entire procedure was completed, the treated area was cleaned, making sure to leave some of the prepping solution back to take advantage of its long term bactericidal properties.         Illustration of the posterior view of the lumbar spine and the posterior neural structures. Laminae of L2 through S1 are labeled. DPRL5, dorsal primary ramus of L5; DPRS1, dorsal primary ramus of S1; DPR3, dorsal primary ramus of L3; FJ, facet (zygapophyseal) joint L3-L4; I, inferior articular process of L4; LB1, lateral branch of dorsal primary ramus of L1; IAB, inferior articular branches from L3 medial branch (supplies L4-L5 facet joint); IBP, intermediate branch plexus; MB3, medial branch of dorsal primary ramus of L3; NR3, third lumbar nerve root; S, superior articular process of L5; SAB, superior articular branches from L4 (supplies L4-5 facet joint also); TP3, transverse  process of L3.  Vitals:   03/20/22 1150 03/20/22 1158 03/20/22 1200 03/20/22 1218  BP: (!) 143/88 (!) 144/82 (!) 138/90 134/60  Pulse:   79 70  Resp: '16 15 16 16  '$ Temp:      TempSrc:      SpO2: 96% 96% 99% 100%  Weight:      Height:         Start Time: 1148 hrs. End Time: 1158 hrs.  Imaging Guidance (Spinal):          Type of Imaging Technique: Fluoroscopy Guidance (Spinal) Indication(s): Assistance in needle guidance and placement for procedures requiring needle placement in or near specific anatomical locations not easily accessible without such assistance. Exposure Time: Please see nurses notes. Contrast: None used. Fluoroscopic Guidance: I was personally present during the use of fluoroscopy. "Tunnel Vision Technique" used to obtain the best possible view of the target area. Parallax error corrected before commencing the procedure. "Direction-depth-direction" technique used to introduce the needle under continuous pulsed fluoroscopy. Once target was reached, antero-posterior, oblique, and lateral fluoroscopic projection used confirm needle placement in all planes. Images permanently stored in EMR. Interpretation: No contrast injected. I personally interpreted the imaging intraoperatively. Adequate needle placement confirmed in multiple planes. Permanent images saved into the patient's record.  Post-operative Assessment:  Post-procedure Vital Signs:  Pulse/HCG Rate: 7076 Temp: 97.8 F (36.6 C) Resp: 16 BP: 134/60 SpO2: 100 %  EBL: None  Complications: No immediate post-treatment complications observed by team, or reported by patient.  Note: The patient tolerated the entire procedure well. A repeat set of vitals were taken after the procedure and the patient was kept under observation following institutional policy, for this type of procedure. Post-procedural neurological assessment was performed, showing return to baseline, prior to discharge. The patient was provided with  post-procedure discharge instructions, including a section on how to identify potential problems. Should any problems arise concerning  this procedure, the patient was given instructions to immediately contact us, at any time, without hesitation. In any case, we plan to contact the patient by telephone for a follow-up status report regarding this interventional procedure.  Comments:  No additional relevant information.  Plan of Care (POC)  Orders:  Orders Placed This Encounter  Procedures   LUMBAR FACET(MEDIAL BRANCH NERVE BLOCK) MBNB    Scheduling Instructions:     Procedure: Lumbar facet block (AKA.: Lumbosacral medial branch nerve block)     Side: Bilateral     Level: L4-5 and L5-S1 Facets (L3, L4, L5, and S1 Medial Branch Nerves)     Sedation: Patient's choice.     Timeframe: Today    Order Specific Question:   Where will this procedure be performed?    Answer:   [facility]   DG [facility] C-ARM 1-60 MIN NO REPORT    Intraoperative interpretation by procedural physician at [place].    Standing Status:   Standing    Number of Occurrences:   1    Order Specific Question:   Reason for exam:    Answer:   Assistance in needle guidance and placement for procedures requiring needle placement in or near specific anatomical locations not easily accessible without such assistance.   Informed Consent Details: Physician/Practitioner Attestation; Transcribe to consent form and obtain patient signature    Nursing Order: Transcribe to consent form and obtain patient signature. Note: Always confirm laterality of pain with Ms. Corinna Capra, before procedure.    Order Specific Question:   Physician/Practitioner attestation of informed consent for procedure/surgical case    Answer:   I, the physician/practitioner, attest that I have discussed with the patient the benefits, risks, side effects, alternatives, likelihood of achieving goals and potential problems during recovery for the  procedure that I have provided informed consent.    Order Specific Question:   Procedure    Answer:   Lumbar Facet Block  under fluoroscopic guidance    Order Specific Question:   Physician/Practitioner performing the procedure    Answer:   Kayslee Furey A. Dossie Arbour MD    Order Specific Question:   Indication/Reason    Answer:   Low Back Pain, with our without leg pain, due to Facet Joint Arthralgia (Joint Pain) Spondylosis (Arthritis of the Spine), without myelopathy or radiculopathy (Nerve Damage).   Provide equipment / supplies at bedside    Procedure tray: "Block Tray" (Disposable  single use) Skin infiltration needle: Regular 1.5-in, 25-G, (x1) Block Needle type: Spinal Amount/quantity: 4 Size: Regular (3.5-inch) Gauge: 22G    Standing Status:   Standing    Number of Occurrences:   1    Order Specific Question:   Specify    Answer:   Block Tray   Chronic Opioid Analgesic:  Oxycodone IR 5 mg tablet, 1 tab p.o. twice daily.  (Still tapering down on 02/14/2022). MME/day: Down to 15 mg/day from 487.5 mg/day   Medications ordered for procedure: Meds ordered this encounter  Medications   lidocaine (XYLOCAINE) 2 % (with pres) injection 400 mg   pentafluoroprop-tetrafluoroeth (GEBAUERS) aerosol   lactated ringers infusion   midazolam (VERSED) injection 0.5-2 mg    Make sure Flumazenil is available in the pyxis when using this medication. If oversedation occurs, administer 0.2 mg IV over 15 sec. If after 45 sec no response, administer 0.2 mg again over 1 min; may repeat at 1 min intervals; not to exceed 4 doses (1 mg)   ropivacaine (PF)  2 mg/mL (0.2%) (NAROPIN) injection 18 mL   triamcinolone acetonide (KENALOG-40) injection 80 mg   vancomycin (VANCOCIN) IVPB 1000 mg/200 mL premix    Pediatric Recommended Dose: 15 mg/kg    Order Specific Question:   Indication:    Answer:   Other Indication (list below)    Order Specific Question:   Other Indication:    Answer:   Procedure Prophylaxis    Medications administered: We administered lidocaine, pentafluoroprop-tetrafluoroeth, lactated ringers, midazolam, ropivacaine (PF) 2 mg/mL (0.2%), triamcinolone acetonide, and vancomycin.  See the medical record for exact dosing, route, and time of administration.  Follow-up plan:   Return in about 2 weeks (around 04/03/2022) for Proc-day (T,Th), (Face2F), (PPE).       Interventional Therapies  Risk Factors  Considerations:      Planned  Pending:   Diagnostic bilateral lumbar facet (L4-5, L5-S1) MBB #2    Under consideration:   Diagnostic bilateral lumbar facet (L4-5, L5-S1) MBB #2  Therapeutic bilateral lumbar (L4-5, L5-S1) facet ( (L3-S1) medial branch RFA #1    Completed:   Diagnostic bilateral lumbar facet (L4-5, L5-S1) MBB x1 (02/22/2022) (100/100/80/80)    Completed by other providers:   right ultrasound-guided pes anserine bursa injection (01/13/2021) by Rosalia Hammers, DO  Right total knee replacement (03/19/2016) by Franchot Mimes, MD  Right knee arthroscopy (05/22/2013) by Franchot Mimes, MD  Midline L4-5 LESI (10/18/2014) by Dr. Mohammed Kindle    Therapeutic  Palliative (PRN) options:   None established   Pharmacotherapy  2D6 Metabolic Activity: Intermediate [id number] Metabolic Activity: Normal 3A4 Metabolic Activity: Normal 3A5 Metabolic Activity: Poor patient of Dr. Mohammed Kindle who had her on opioid analgesics totaling 487.5 MME/day by 10/12/2021.  Slow taper started 02/14/2022. Patient is currently completely off of all opioids.        Recent Visits Date Type Provider Dept  03/13/22 Office Visit Milinda Pointer, MD [facility]  02/22/22 Procedure visit Milinda Pointer, MD [facility]  02/14/22 Office Visit Milinda Pointer, MD [facility]  12/25/21 Office Visit Milinda Pointer, MD [facility]  Showing recent visits within past 90 days and meeting all other requirements Today's Visits Date Type Provider Dept   03/20/22 Procedure visit Milinda Pointer, MD [facility]  Showing today's visits and meeting all other requirements Future Appointments Date Type Provider Dept  04/05/22 Appointment Milinda Pointer, MD [facility]  Showing future appointments within next 90 days and meeting all other requirements  Disposition: Discharge home  Discharge (Date  Time): 03/20/2022;   hrs.   Primary Care Physician: Einar Pheasant, MD Location: [facility] Note by: Gaspar Cola, MD Date: 03/20/2022; Time: 12:57 PM  Disclaimer:  Medicine is not an Chief Strategy Officer. The only guarantee in medicine is that nothing is guaranteed. It is important to note that the decision to proceed with this intervention was based on the information collected from the patient. The Data and conclusions were drawn from the patient's questionnaire, the interview, and the physical examination. Because the information was provided in large part by the patient, it cannot be guaranteed that it has not been purposely or unconsciously manipulated. Every effort has been made to obtain as much relevant data as possible for this evaluation. It is important to note that the conclusions that lead to this procedure are derived in large part from the available data. Always take into account that the treatment will also be dependent on availability of resources and existing  treatment guidelines, considered by other Pain Management Practitioners as being common knowledge and practice, at the time of the intervention. For Medico-Legal purposes, it is also important to point out that variation in procedural techniques and pharmacological choices are the acceptable norm. The indications, contraindications, technique, and results of the above procedure should only be interpreted and judged by a Board-Certified Interventional Pain Specialist with extensive familiarity and expertise in the same exact  procedure and technique.

## 2022-03-20 NOTE — Patient Instructions (Signed)

## 2022-03-21 ENCOUNTER — Telehealth: Payer: Self-pay

## 2022-03-21 NOTE — Telephone Encounter (Signed)
Post procedure follow up. Patient states she is having some pain today.  Explained aboput the process and informed her to call us for any further questions or concerns.

## 2022-04-05 ENCOUNTER — Ambulatory Visit: Payer: Medicare Other | Attending: Pain Medicine | Admitting: Pain Medicine

## 2022-04-05 VITALS — BP 137/85 | HR 102 | Temp 96.6°F | Resp 18 | Ht 67.0 in | Wt 168.0 lb

## 2022-04-05 DIAGNOSIS — M79652 Pain in left thigh: Secondary | ICD-10-CM | POA: Diagnosis not present

## 2022-04-05 DIAGNOSIS — M4317 Spondylolisthesis, lumbosacral region: Secondary | ICD-10-CM | POA: Diagnosis not present

## 2022-04-05 DIAGNOSIS — M545 Low back pain, unspecified: Secondary | ICD-10-CM | POA: Diagnosis not present

## 2022-04-05 DIAGNOSIS — M47816 Spondylosis without myelopathy or radiculopathy, lumbar region: Secondary | ICD-10-CM | POA: Diagnosis not present

## 2022-04-05 DIAGNOSIS — M47817 Spondylosis without myelopathy or radiculopathy, lumbosacral region: Secondary | ICD-10-CM | POA: Diagnosis not present

## 2022-04-05 DIAGNOSIS — M79651 Pain in right thigh: Secondary | ICD-10-CM | POA: Diagnosis not present

## 2022-04-05 DIAGNOSIS — G8929 Other chronic pain: Secondary | ICD-10-CM | POA: Insufficient documentation

## 2022-04-05 NOTE — Progress Notes (Signed)
PROVIDER NOTE: Information contained herein reflects review and annotations entered in association with encounter. Interpretation of such information and data should be left to medically-trained personnel. Information provided to patient can be located elsewhere in the medical record under "Patient Instructions". Document created using STT-dictation technology, any transcriptional errors that may result from process are unintentional.    Patient: Maria Keller  Service Category: E/M  Provider: Gaspar Cola, MD  DOB: 02/22/1950  DOS: 04/05/2022  Referring Provider: Einar Pheasant, MD  MRN: [id number]  Specialty: Interventional Pain Management  PCP: Einar Pheasant, MD  Type: Established Patient  Setting: Ambulatory outpatient    Location: Office  Delivery: Face-to-face     HPI  [name], a 72 y.o. year old female, is here today because of her Chronic bilateral low back pain without sciatica [M54.50, G89.29]. Ms. Hufnagle primary complain today is Back Pain (low) Last encounter: My last encounter with her was on 03/20/2022. Pertinent problems: Ms. Taff has History of lymphoma; Fibromyalgia; Pain in joint involving multiple sites; Non-Hodgkin's lymphoma ([place]); Chronic low back pain (1ry area of Pain) (Bilateral) (R>L) w/o sciatica; Chronic sacroiliac joint pain (Bilateral); DDD (degenerative disc disease), lumbar; Lumbar facet syndrome; Sacroiliac joint dysfunction; Postherpetic neuralgia; Degenerative disc disease, lumbar; Lumbar radiculopathy; Headache; 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[facility]); Tingling sensation; Knee swelling; Loose total knee arthroplasty, sequela (Right); 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, right; History of shingles; History of urinary stone; Intractable migraine with aura without status migrainosus; Myalgia and myositis; Osteoarthritis; Primary osteoarthritis of right knee; History of total knee replacement (Right); Lymphoma ([place]); Chronic pain syndrome; Chronic  thigh pain (2ry area of Pain) (Bilateral) (R>L); Chronic knee pain after total replacement (3ry area of Pain) (Right); Chronic shoulder pain (4th area of Pain) (Bilateral) (R>L); Status post revision of total knee replacement, right; Osteoarthritis of acromioclavicular joints (Bilateral); Tendinopathy of rotator cuff (Bilateral); Shoulder enthesopathy (Bilateral); Grade 1 Anterolisthesis of lumbosacral spine of L5/S1; DDD (degenerative disc disease), lumbosacral; Lumbosacral facet arthropathy; Osteoarthritis of sacroiliac joints (Bilateral) ([place]); Spondylosis without myelopathy or radiculopathy, lumbosacral region; and S/P TKR (total knee replacement), right on their pertinent problem list. Pain Assessment: Severity of Chronic pain is reported as a 3 /10. Location: Back Lower/radiates into both thighs. Onset: More than a month ago. Quality: Aching, Dull, Sharp. Timing: Constant. Modifying factor(s): procedure. Vitals:  height is '5\' 7"'$  (1.702 m) and weight is 168 lb (76.2 kg). Her temperature is 96.6 F (35.9 C) (abnormal). Her blood pressure is 137/85 and her pulse is 102 (abnormal). Her respiration is 18 and oxygen saturation is 100%.  BMI: Estimated body mass index is 26.31 kg/m as calculated from the following:   Height as of this encounter: '5\' 7"'$  (1.702 m).   Weight as of this encounter: 168 lb (76.2 kg).  Reason for encounter: post-procedure evaluation and assessment.  Today the patient confirmed that for the duration of the local anesthetic again she had 100% relief of her low back and hip pain.  After the local anesthetic wore off, then the relief of the pain went down to an ongoing 75% improvement.  This is again confirms the levels injected to be responsible for her pain and therefore we will go ahead and plan on doing the radiofrequency ablation.  The plan was shared with the patient who understood and accepted.  Post-procedure evaluation   Procedure: Lumbar Facet, Medial Branch Block(s) #2   Laterality: Bilateral  Level: L3, L4, L5, and S1 Medial Branch Level(s). Injecting  these levels blocks the L4-5 and L5-S1 lumbar facet joints. Imaging: Fluoroscopic guidance         Anesthesia: Local anesthesia (1-2% Lidocaine) Anxiolysis: IV Versed 2.0 mg Sedation: No Sedation                       DOS: 03/20/2022 Performed by: Gaspar Cola, MD  Primary Purpose: Diagnostic/Therapeutic Indications: Low back pain severe enough to impact quality of life or function. 1. Lumbar facet syndrome   2. Grade 1 Anterolisthesis of lumbosacral spine of L5/S1   3. Spondylosis without myelopathy or radiculopathy, lumbosacral region   4. Chronic low back pain (1ry area of Pain) (Bilateral) (R>L) w/o sciatica   5. Lumbosacral facet arthropathy   6. DDD (degenerative disc disease), lumbosacral   7. 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[place])   8. History of total knee replacement (Right)   9. Need for prophylactic antibiotic    NAS-11 Pain score:   Pre-procedure: 5 /10   Post-procedure: 0-No pain/10      Effectiveness:  Initial hour after procedure: 100 %. Subsequent 4-6 hours post-procedure: 100 %. Analgesia past initial 6 hours:  (hips and sacral area got relief for 6 hours and then returned.). Ongoing improvement:  Analgesic: The patient attained 100% relief of the pain for the duration of the local anesthetic followed by an ongoing 75% improvement.  In terms of the lower back she indicates an ongoing 100% but in terms of the referred pain towards the hips she indicates that that is the 75%. Function: Ms. Decook reports improvement in function ROM: Ms. Koskovich reports improvement in ROM  Statement of Medical Necessity:  Ms. Ley continues to experienced debilitating chronic nerve-associated pain from the Lumbosacral Facet Syndrome (Spondylosis without myelopathy or radiculopathy, lumbosacral region [id number]).  Indications: Pain severe enough to impact quality of life and/or  function.  Duration: This pain has been diagnosed as "Chronic", defined as persistent pain of longer than three (3) months in duration.  Effects: Pain has been reported to be severe enough to impact quality of life and/or function.  Conservative care: Ms. Giove indicated having tried and either failed tolerate, failed to respond, or was unable to get enough benefit from other more conservative therapies including, but not limited to: 1. Over-the-counter oral analgesic medications (i.e.: ibuprofen, naproxen, etc.) 2. Anti-inflammatory medications 3. Muscle relaxants 4. Membrane stabilizers 5. Opioids  Physical therapy (PT): Ms. Raimo indicated having tried and failed physical therapy options such as supervised physical therapy, chiropractic manipulations, and/or home exercise program (HEP), and modalities (Heat, ice, etc.).  Interventional therapies: Nerve blocks have failed to provide any significant long-term benefit.  Surgical care: Not indicated.  Physical exam: Has been consistent with Lumbosacral Facet Syndrome.  Diagnostic imaging: Lumbosacral Facet Arthropathy.                Diagnostic interventional therapies: Ms. Caldon has attained greater than 50% reduction in pain from at least two (2) diagnostic medial branch blocks conducted in separate occasions.   For the above listed reason, I believe, as the examining and treating physician, that it is medically necessary to proceed with Non-Pulsed Radiofrequency Ablation for the purpose of attempting to prolong the duration of the benefits seen with the diagnostic injections.  At this point, after having personally evaluated this patient and being the treating attending physician, it is my professional opinion, as a board-certified pain management specialist, that it is medically necessary for this patient to undergo the treatments  that I am ordering.  Any attempt at denying these treatments, by a health care insurance entity, represents a  case of "practicing medicine without a license", which is currently illegal in the state of [place].  Furthermore, if this company attempts to use a Mudlogger" to deny such treatment without having performed a face-to-face evaluation, represents an act of gross negligence and malpractice.   Pharmacotherapy Assessment  Analgesic: Oxycodone IR 5 mg tablet, 1 tab p.o. twice daily.  (Still tapering down on 02/14/2022). MME/day: Down to 15 mg/day from 487.5 mg/day   Monitoring: [place] PMP: PDMP not reviewed this encounter.       Pharmacotherapy: No side-effects or adverse reactions reported. Compliance: No problems identified. Effectiveness: Clinically acceptable.  Dewayne Shorter, RN  04/05/2022 10:49 AM  Sign when Signing Visit Safety precautions to be maintained throughout the outpatient stay will include: orient to surroundings, keep bed in low position, maintain call bell within reach at all times, provide assistance with transfer out of bed and ambulation.    No results found for: "CBDTHCR" No results found for: "D8THCCBX" No results found for: "D9THCCBX"  UDS:  Summary  Date Value Ref Range Status  02/14/2022 Note  Final    Comment:    ==================================================================== [employer] [facility] 13 (MW) ==================================================================== Test                             Result       Flag       Units  Drug Present and Declared for Prescription Verification   Oxycodone                      2285         EXPECTED   ng/mg creat   Oxymorphone                    77           EXPECTED   ng/mg creat   Noroxycodone                   >2967        EXPECTED   ng/mg creat   Noroxymorphone                 54           EXPECTED   ng/mg creat    Sources of oxycodone are scheduled prescription medications.    Oxymorphone, noroxycodone, and noroxymorphone are expected    metabolites of oxycodone. Oxymorphone is also available as a     scheduled prescription medication.  Drug Absent but Declared for Prescription Verification   Fentanyl                       Not Detected UNEXPECTED ng/mg creat ==================================================================== Test                      Result    Flag   Units      Ref Range   Creatinine              337              mg/dL      >=20 ==================================================================== Declared Medications:  The flagging and interpretation on this report are based on the  following declared medications.  Unexpected results may arise from  inaccuracies in the declared  medications.   **Note: The testing scope of this panel includes these medications:   Fentanyl (Duragesic)  Oxycodone (Roxicodone)   **Note: The testing scope of this panel does not include the  following reported medications:   Acetaminophen (Tylenol)  Aspirin  Atorvastatin (Lipitor)  Azelastine (Astelin)  Calcium  Cetirizine (Zyrtec)  Estradiol (Estrace)  Fluticasone (Flonase)  Furosemide (Lasix)  Multivitamin  Mupirocin (Bactroban)  Naloxone (Narcan)  Omeprazole (Prilosec)  Ondansetron (Zofran)  Prednisone (Deltasone)  Sennosides (Senokot)  Vitamin D3 ==================================================================== For clinical consultation, please call [phone]. ====================================================================       ROS  Constitutional: Denies any fever or chills Gastrointestinal: No reported hemesis, hematochezia, vomiting, or acute GI distress Musculoskeletal: Denies any acute onset joint swelling, redness, loss of ROM, or weakness Neurological: No reported episodes of acute onset apraxia, aphasia, dysarthria, agnosia, amnesia, paralysis, loss of coordination, or loss of consciousness  Medication Review  acetaminophen, aspirin EC, atorvastatin, azelastine, calcium carbonate, cetirizine, cholecalciferol, estradiol, fluticasone,  furosemide, multivitamin, mupirocin ointment, omeprazole, and senna-docusate  History Review  Allergy: Ms. Penalver is allergic to oxycodone hcl, oxycontin [oxycodone hcl], penicillins, chloral hydrate, ciprofloxacin, ciprofloxacin hcl, colace [docusate calcium], methotrexate derivatives, parafon forte dsc [chlorzoxazone], penicillin g, tape, bextra [valdecoxib], oxycodone, and rofecoxib. Drug: Ms. Ignacio  reports no history of drug use. Alcohol:  reports no history of alcohol use. Tobacco:  reports that she has never smoked. She has never used smokeless tobacco. Social: Ms. Watwood  reports that she has never smoked. She has never used smokeless tobacco. She reports that she does not drink alcohol and does not use drugs. Medical:  has a past medical history of Anemia, Arthritis, Bronchitis (08/2015), Chronic back pain, Depression, Diverticulosis, Esophageal stricture, Fibromyalgia, Fibromyalgia, Fibromyalgia, Follicular lymphoma ([place]), Follicular lymphoma ([place]), GERD (gastroesophageal reflux disease), GERD (gastroesophageal reflux disease), Hemorrhoid, Hiatal hernia, History of kidney stones, Hypercholesterolemia, IBS (irritable bowel syndrome), IBS (irritable bowel syndrome), Nephrolithiasis, PONV (postoperative nausea and vomiting), Shingles outbreak (11/26/2011), and Sleep apnea. Surgical: Ms. Cleaves  has a past surgical history that includes Tonsilectomy/adenoidectomy with myringotomy (1970); Appendectomy; Tubal ligation (1976); Vaginal hysterectomy (1980's); Left oophorectomy; Ovarian cyst removal; Bladder surgery (1993); Cholecystectomy (1995); ORIF ankle fracture (2012); Rectocele repair (N/A); Breast biopsy (N/A); Leg Surgery (Left); Tonsillectomy (N/A); Replacement total knee (Right, [id number]); history of helicobacter pylori infection; capsule endoscopy; Joint replacement (Right); Fracture surgery (Left); Esophagogastroduodenoscopy (egd) with propofol (N/A, 06/19/2017); Colonoscopy with propofol (N/A,  06/19/2017); Eye surgery; and Total knee revision (Right, 11/07/2021). Family: family history includes Arthritis in her maternal grandmother; Breast cancer in an other family member; Diabetes in her maternal grandfather and maternal grandmother; Heart disease in her maternal grandmother; Hypertension in her maternal grandmother; Lung cancer in an other family member; Ulcers in her father and mother.  Laboratory Chemistry Profile   Renal Lab Results  Component Value Date   BUN 14 12/04/2021   CREATININE 0.64 12/04/2021   BCR 13 10/12/2021   GFR 87.92 12/04/2021   GFRAA >60 11/06/2012   GFRNONAA >60 11/08/2021    Hepatic Lab Results  Component Value Date   AST 27 12/04/2021   ALT 14 12/04/2021   ALBUMIN 3.7 12/04/2021   ALKPHOS 102 12/04/2021   HCVAB NEGATIVE 05/04/2015    Electrolytes Lab Results  Component Value Date   NA 140 12/04/2021   K 4.4 12/04/2021   CL 104 12/04/2021   CALCIUM 8.7 12/04/2021   MG 2.4 (H) 10/12/2021    Bone Lab Results  Component Value Date   25OHVITD1 32 10/12/2021  25OHVITD2 <1.0 10/12/2021   25OHVITD3 32 10/12/2021    Inflammation (CRP: Acute Phase) (ESR: Chronic Phase) Lab Results  Component Value Date   CRP 3 10/12/2021   ESRSEDRATE 24 10/12/2021         Note: Above Lab results reviewed.  Recent Imaging Review  DG PAIN CLINIC C-ARM 1-60 MIN NO REPORT Fluoro was used, but no Radiologist interpretation will be provided.  Please refer to "NOTES" tab for provider progress note. Note: Reviewed        Physical Exam  General appearance: Well nourished, well developed, and well hydrated. In no apparent acute distress Mental status: Alert, oriented x 3 (person, place, & time)       Respiratory: No evidence of acute respiratory distress Eyes: PERLA Vitals: BP 137/85   Pulse (!) 102   Temp (!) 96.6 F (35.9 C)   Resp 18   Ht '5\' 7"'$  (1.702 m)   Wt 168 lb (76.2 kg)   SpO2 100%   BMI 26.31 kg/m  BMI: Estimated body mass index is 26.31  kg/m as calculated from the following:   Height as of this encounter: '5\' 7"'$  (1.702 m).   Weight as of this encounter: 168 lb (76.2 kg). Ideal: Ideal body weight: 61.6 kg (135 lb 12.9 oz) Adjusted ideal body weight: 67.4 kg (148 lb 10.9 oz)  Assessment   Diagnosis Status  1. Chronic low back pain (1ry area of Pain) (Bilateral) (R>L) w/o sciatica   2. Lumbar facet syndrome   3. Chronic thigh pain (2ry area of Pain) (Bilateral) (R>L)   4. Grade 1 Anterolisthesis of lumbosacral spine of L5/S1   5. Lumbosacral facet arthropathy   6. Spondylosis without myelopathy or radiculopathy, lumbosacral region    Controlled Controlled Controlled   Updated Problems: No problems updated.  Plan of Care  Problem-specific:  No problem-specific Assessment & Plan notes found for this encounter.  Ms. DELVIN CHRZANOWSKI has a current medication list which includes the following long-term medication(s): atorvastatin, azelastine, calcium carbonate, cetirizine, estradiol, fluticasone, furosemide, and omeprazole.  Pharmacotherapy (Medications Ordered): No orders of the defined types were placed in this encounter.  Orders:  Orders Placed This Encounter  Procedures   Radiofrequency,Lumbar    Standing Status:   Future    Standing Expiration Date:   07/04/2022    Scheduling Instructions:     Side(s): Right-sided     Level: L4-5 and L5-S1 Facets (L3, L4, L5, and S1 Medial Branch)     Sedation: With Sedation.     Scheduling Timeframe: As soon as pre-approved    Order Specific Question:   Where will this procedure be performed?    Answer:   [facility]   Follow-up plan:   Return for (17mn), (ECT): (R) L-FCT RFA #1.      Interventional Therapies  Risk Factors  Considerations:      Planned  Pending:   Diagnostic bilateral lumbar facet (L4-5, L5-S1) MBB #2    Under consideration:   Diagnostic bilateral lumbar facet (L4-5, L5-S1) MBB #2  Therapeutic bilateral lumbar (L4-5, L5-S1) facet (  (L3-S1) medial branch RFA #1    Completed:   Diagnostic bilateral lumbar facet (L4-5, L5-S1) MBB x1 (02/22/2022) (100/100/80/80)    Completed by other providers:   right ultrasound-guided pes anserine bursa injection (01/13/2021) by ARosalia Hammers DO  Right total knee replacement (03/19/2016) by HFranchot Mimes MD  Right knee arthroscopy (05/22/2013) by HFranchot Mimes MD  Midline L4-5 LESI (10/18/2014) by Dr.  Mohammed Kindle    Therapeutic  Palliative (PRN) options:   None established   Pharmacotherapy  2D6 Metabolic Activity: Intermediate [id number] Metabolic Activity: Normal 3A4 Metabolic Activity: Normal 3A5 Metabolic Activity: Poor patient of Dr. Mohammed Kindle who had her on opioid analgesics totaling 487.5 MME/day by 10/12/2021.  Slow taper started 02/14/2022. Patient is currently completely off of all opioids.         Recent Visits Date Type Provider Dept  03/20/22 Procedure visit Milinda Pointer, MD [facility]  03/13/22 Office Visit Milinda Pointer, MD [facility]  02/22/22 Procedure visit Milinda Pointer, MD [facility]  02/14/22 Office Visit Milinda Pointer, MD [facility]  Showing recent visits within past 90 days and meeting all other requirements Today's Visits Date Type Provider Dept  04/05/22 Office Visit Milinda Pointer, MD [facility]  Showing today's visits and meeting all other requirements Future Appointments No visits were found meeting these conditions. Showing future appointments within next 90 days and meeting all other requirements  I discussed the assessment and treatment plan with the patient. The patient was provided an opportunity to ask questions and all were answered. The patient agreed with the plan and demonstrated an understanding of the instructions.  Patient advised to call back or seek an in-person evaluation if the symptoms or condition worsens.  Duration of encounter: 30 minutes.   Total time on encounter, as per AMA guidelines included both the face-to-face and non-face-to-face time personally spent by the physician and/or other qualified health care professional(s) on the day of the encounter (includes time in activities that require the physician or other qualified health care professional and does not include time in activities normally performed by clinical staff). Physician's time may include the following activities when performed: Preparing to see the patient (e.g., pre-charting review of records, searching for previously ordered imaging, lab work, and nerve conduction tests) Review of prior analgesic pharmacotherapies. Reviewing PMP Interpreting ordered tests (e.g., lab work, imaging, nerve conduction tests) Performing post-procedure evaluations, including interpretation of diagnostic procedures Obtaining and/or reviewing separately obtained history Performing a medically appropriate examination and/or evaluation Counseling and educating the patient/family/caregiver Ordering medications, tests, or procedures Referring and communicating with other health care professionals (when not separately reported) Documenting clinical information in the electronic or other health record Independently interpreting results (not separately reported) and communicating results to the patient/ family/caregiver Care coordination (not separately reported)  Note by: Gaspar Cola, MD Date: 04/05/2022; Time: 11:20 AM

## 2022-04-05 NOTE — Progress Notes (Signed)
Safety precautions to be maintained throughout the outpatient stay will include: orient to surroundings, keep bed in low position, maintain call bell within reach at all times, provide assistance with transfer out of bed and ambulation.  

## 2022-04-05 NOTE — Patient Instructions (Addendum)
______________________________________________________________________  Procedure instructions  Do not eat or drink fluids (other than water) for 6 hours before your procedure  No water for 2 hours before your procedure  Take your blood pressure medicine with a sip of water  Arrive 30 minutes before your appointment  Carefully read the "Preparing for your procedure" detailed instructions  If you have questions call us at [phone]  _____________________________________________________________________    ______________________________________________________________________  Preparing for your procedure  Appointments: If you think you may not be able to keep your appointment, call 24-48 hours in advance to cancel. We need time to make it available to others.  During your procedure appointment there will be: No Prescription Refills. No disability issues to discussed. No medication changes or discussions.  Instructions: Food intake: Avoid eating anything solid for at least 8 hours prior to your procedure. Clear liquid intake: You may take clear liquids such as water up to 2 hours prior to your procedure. (No carbonated drinks. No soda.) Transportation: Unless otherwise stated by your physician, bring a driver. Morning Medicines: Except for blood thinners, take all of your other morning medications with a sip of water. Make sure to take your heart and blood pressure medicines. If your blood pressure's lower number is above 100, the case will be rescheduled. Blood thinners: Make sure to stop your blood thinners as instructed.  If you take a blood thinner, but were not instructed to stop it, call our office [phone] and ask to talk to a nurse. Not stopping a blood thinner prior to certain procedures could lead to serious complications. Diabetics on insulin: Notify the staff so that you can be scheduled 1st case in the morning. If your diabetes requires high dose insulin,  take only  of your normal insulin dose the morning of the procedure and notify the staff that you have done so. Preventing infections: Shower with an antibacterial soap the morning of your procedure.  Build-up your immune system: Take 1000 mg of Vitamin C with every meal (3 times a day) the day prior to your procedure. Antibiotics: Inform the nursing staff if you are taking any antibiotics or if you have any conditions that may require antibiotics prior to procedures. (Example: recent joint implants)   Pregnancy: If you are pregnant make sure to notify the nursing staff. Not doing so may result in injury to the fetus, including death.  Sickness: If you have a cold, fever, or any active infections, call and cancel or reschedule your procedure. Receiving steroids while having an infection may result in complications. Arrival: You must be in the facility at least 30 minutes prior to your scheduled procedure. Tardiness: Your scheduled time is also the cutoff time. If you do not arrive at least 15 minutes prior to your procedure, you will be rescheduled.  Children: Do not bring any children with you. Make arrangements to keep them home. Dress appropriately: There is always a possibility that your clothing may get soiled. Avoid long dresses. Valuables: Do not bring any jewelry or valuables.  Reasons to call and reschedule or cancel your procedure: (Following these recommendations will minimize the risk of a serious complication.) Surgeries: Avoid having procedures within 2 weeks of any surgery. (Avoid for 2 weeks before or after any surgery). Flu Shots: Avoid having procedures within 2 weeks of a flu shots or . (Avoid for 2 weeks before or after immunizations). Barium: Avoid having a procedure within 7-10 days after having had a radiological study involving the use of  radiological contrast. (Myelograms, Barium swallow or enema study). Heart attacks: Avoid any elective procedures or surgeries for the  initial 6 months after a "Myocardial Infarction" (Heart Attack). Blood thinners: It is imperative that you stop these medications before procedures. Let us know if you if you take any blood thinner.  Infection: Avoid procedures during or within two weeks of an infection (including chest colds or gastrointestinal problems). Symptoms associated with infections include: Localized redness, fever, chills, night sweats or profuse sweating, burning sensation when voiding, cough, congestion, stuffiness, runny nose, sore throat, diarrhea, nausea, vomiting, cold or Flu symptoms, recent or current infections. It is specially important if the infection is over the area that we intend to treat. Heart and lung problems: Symptoms that may suggest an active cardiopulmonary problem include: cough, chest pain, breathing difficulties or shortness of breath, dizziness, ankle swelling, uncontrolled high or unusually low blood pressure, and/or palpitations. If you are experiencing any of these symptoms, cancel your procedure and contact your primary care physician for an evaluation.  Remember:  Regular Business hours are:  Monday to Thursday 8:00 AM to 4:00 PM  Provider's Schedule: Milinda Pointer, MD:  Procedure days: Tuesday and Thursday 7:30 AM to 4:00 PM  Gillis Santa, MD:  Procedure days: Monday and Wednesday 7:30 AM to 4:00 PM  ______________________________________________________________________    ____________________________________________________________________________________________  General Risks and Possible Complications  Patient Responsibilities: It is important that you read this as it is part of your informed consent. It is our duty to inform you of the risks and possible complications associated with treatments offered to you. It is your responsibility as a patient to read this and to ask questions about anything that is not clear or that you believe was not covered in this  document.  Patient's Rights: You have the right to refuse treatment. You also have the right to change your mind, even after initially having agreed to have the treatment done. However, under this last option, if you wait until the last second to change your mind, you may be charged for the materials used up to that point.  Introduction: Medicine is not an Chief Strategy Officer. Everything in Medicine, including the lack of treatment(s), carries the potential for danger, harm, or loss (which is by definition: Risk). In Medicine, a complication is a secondary problem, condition, or disease that can aggravate an already existing one. All treatments carry the risk of possible complications. The fact that a side effects or complications occurs, does not imply that the treatment was conducted incorrectly. It must be clearly understood that these can happen even when everything is done following the highest safety standards.  No treatment: You can choose not to proceed with the proposed treatment alternative. The "PRO(s)" would include: avoiding the risk of complications associated with the therapy. The "CON(s)" would include: not getting any of the treatment benefits. These benefits fall under one of three categories: diagnostic; therapeutic; and/or palliative. Diagnostic benefits include: getting information which can ultimately lead to improvement of the disease or symptom(s). Therapeutic benefits are those associated with the successful treatment of the disease. Finally, palliative benefits are those related to the decrease of the primary symptoms, without necessarily curing the condition (example: decreasing the pain from a flare-up of a chronic condition, such as incurable terminal cancer).  General Risks and Complications: These are associated to most interventional treatments. They can occur alone, or in combination. They fall under one of the following six (6) categories: no benefit or worsening of symptoms;  bleeding; infection; nerve damage; allergic reactions; and/or death. No benefits or worsening of symptoms: In Medicine there are no guarantees, only probabilities. No healthcare provider can ever guarantee that a medical treatment will work, they can only state the probability that it may. Furthermore, there is always the possibility that the condition may worsen, either directly, or indirectly, as a consequence of the treatment. Bleeding: This is more common if the patient is taking a blood thinner, either prescription or over the counter (example: Goody Powders, Fish oil, Aspirin, Garlic, etc.), or if suffering a condition associated with impaired coagulation (example: Hemophilia, cirrhosis of the liver, low platelet counts, etc.). However, even if you do not have one on these, it can still happen. If you have any of these conditions, or take one of these drugs, make sure to notify your treating physician. Infection: This is more common in patients with a compromised immune system, either due to disease (example: diabetes, cancer, human immunodeficiency virus [HIV], etc.), or due to medications or treatments (example: therapies used to treat cancer and rheumatological diseases). However, even if you do not have one on these, it can still happen. If you have any of these conditions, or take one of these drugs, make sure to notify your treating physician. Nerve Damage: This is more common when the treatment is an invasive one, but it can also happen with the use of medications, such as those used in the treatment of cancer. The damage can occur to small secondary nerves, or to large primary ones, such as those in the spinal cord and brain. This damage may be temporary or permanent and it may lead to impairments that can range from temporary numbness to permanent paralysis and/or brain death. Allergic Reactions: Any time a substance or material comes in contact with our body, there is the possibility of an  allergic reaction. These can range from a mild skin rash (contact dermatitis) to a severe systemic reaction (anaphylactic reaction), which can result in death. Death: In general, any medical intervention can result in death, most of the time due to an unforeseen complication. ____________________________________________________________________________________________   Radiofrequency Ablation Radiofrequency ablation is a procedure that is performed to relieve pain. The procedure is often used for back, neck, or arm pain. Radiofrequency ablation involves the use of a machine that creates radio waves to make heat. During the procedure, the heat is applied to the nerve that carries the pain signal. The heat damages the nerve and interferes with the pain signal. Pain relief usually starts about 2 weeks after the procedure and lasts for 6 months to 1 year. Tell a health care provider about: Any allergies you have. All medicines you are taking, including vitamins, herbs, eye drops, creams, and over-the-counter medicines. Any problems you or family members have had with anesthetic medicines. Any bleeding problems you have. Any surgeries you have had. Any medical conditions you have. Whether you are pregnant or may be pregnant. What are the risks? Generally, this is a safe procedure. However, problems may occur, including: Pain or soreness at the injection site. Allergic reaction to medicines given during the procedure. Bleeding. Infection at the injection site. Damage to nerves or blood vessels. What happens before the procedure? When to stop eating and drinking Follow instructions from your health care provider about what you may eat and drink before your procedure. These may include: 8 hours before the procedure Stop eating most foods. Do not eat meat, fried foods, or fatty foods. Eat only light foods, such  as toast or crackers. All liquids are okay except energy drinks and alcohol. 6 hours  before the procedure Stop eating. Drink only clear liquids, such as water, clear fruit juice, black coffee, plain tea, and sports drinks. Do not drink energy drinks or alcohol. 2 hours before the procedure Stop drinking all liquids. You may be allowed to take medicine with small sips of water. If you do not follow your health care provider's instructions, your procedure may be delayed or canceled. Medicines Ask your health care provider about: Changing or stopping your regular medicines. This is especially important if you are taking diabetes medicines or blood thinners. Taking medicines such as aspirin and ibuprofen. These medicines can thin your blood. Do not take these medicines unless your health care provider tells you to take them. Taking over-the-counter medicines, vitamins, herbs, and supplements. General instructions Ask your health care provider what steps will be taken to help prevent infection. These steps may include: Removing hair at the procedure site. Washing skin with a germ-killing soap. Taking antibiotic medicine. If you will be going home right after the procedure, plan to have a responsible adult: Take you home from the hospital or clinic. You will not be allowed to drive. Care for you for the time you are told. What happens during the procedure?  You will be awake during the procedure. You will need to be able to talk with the health care provider during the procedure. An IV will be inserted into one of your veins. You will be given one or more of the following: A medicine to help you relax (sedative). A medicine to numb the area (local anesthetic). Your health care provider will insert a radiofrequency needle into the area to be treated. This is done with the help of fluoroscopy. A wire that carries the radio waves (electrode) will be put through the radiofrequency needle. An electrical pulse will be sent through the electrode to verify the correct nerve that is  causing your pain. You will feel a tingling sensation, and you may have muscle twitching. The tissue around the needle tip will be heated by an electric current that comes from the radiofrequency machine. This will numb the nerves. The needle will be removed. A bandage (dressing) will be put on the insertion area. The procedure may vary among health care providers and hospitals. What happens after the procedure? Your blood pressure, heart rate, breathing rate, and blood oxygen level will be monitored until you leave the hospital or clinic. Return to your normal activities as told by your health care provider. Ask your health care provider what activities are safe for you. If you were given a sedative during the procedure, it can affect you for several hours. Do not drive or operate machinery until your health care provider says that it is safe. Summary Radiofrequency ablation is a procedure that is performed to relieve pain. The procedure is often used for back, neck, or arm pain. Radiofrequency ablation involves the use of a machine that creates radio waves to make heat. Plan to have a responsible adult take you home from the hospital or clinic. Do not drive or operate machinery until your health care provider says that it is safe. Return to your normal activities as told by your health care provider. Ask your health care provider what activities are safe for you. This information is not intended to replace advice given to you by your health care provider. Make sure you discuss any questions you have with your  health care provider. Document Revised: 07/12/2020 Document Reviewed: 07/12/2020 [employer] Patient Education  [place].

## 2022-04-06 ENCOUNTER — Other Ambulatory Visit (INDEPENDENT_AMBULATORY_CARE_PROVIDER_SITE_OTHER): Payer: Medicare Other

## 2022-04-06 DIAGNOSIS — E7800 Pure hypercholesterolemia, unspecified: Secondary | ICD-10-CM | POA: Diagnosis not present

## 2022-04-06 DIAGNOSIS — R739 Hyperglycemia, unspecified: Secondary | ICD-10-CM

## 2022-04-06 LAB — LIPID PANEL
Cholesterol: 228 mg/dL — ABNORMAL HIGH (ref 0–200)
HDL: 66.1 mg/dL (ref >39.00)
LDL Cholesterol: 139 mg/dL — ABNORMAL HIGH (ref 0–99)
NonHDL: 161.74
Total CHOL/HDL Ratio: 3
Triglycerides: 113 mg/dL (ref 0.0–149.0)
VLDL: 22.6 mg/dL (ref 0.0–40.0)

## 2022-04-06 LAB — CBC WITH DIFFERENTIAL/PLATELET
Basophils Absolute: 0 10*3/uL (ref 0.0–0.1)
Basophils Relative: 0.4 % (ref 0.0–3.0)
Eosinophils Absolute: 0.1 10*3/uL (ref 0.0–0.7)
Eosinophils Relative: 0.8 % (ref 0.0–5.0)
HCT: 40.4 % (ref 36.0–46.0)
Hemoglobin: 13.8 g/dL (ref 12.0–15.0)
Lymphocytes Relative: 31.3 % (ref 12.0–46.0)
Lymphs Abs: 3.5 10*3/uL (ref 0.7–4.0)
MCHC: 34.1 g/dL (ref 30.0–36.0)
MCV: 87.6 fl (ref 78.0–100.0)
Monocytes Absolute: 0.9 10*3/uL (ref 0.1–1.0)
Monocytes Relative: 8.2 % (ref 3.0–12.0)
Neutro Abs: 6.6 10*3/uL (ref 1.4–7.7)
Neutrophils Relative %: 59.3 % (ref 43.0–77.0)
Platelets: 297 10*3/uL (ref 150.0–400.0)
RBC: 4.61 Mil/uL (ref 3.87–5.11)
RDW: 15.9 % — ABNORMAL HIGH (ref 11.5–15.5)
WBC: 11.2 10*3/uL — ABNORMAL HIGH (ref 4.0–10.5)

## 2022-04-06 LAB — BASIC METABOLIC PANEL
BUN: 23 mg/dL (ref 6–23)
CO2: 29 mEq/L (ref 19–32)
Calcium: 10 mg/dL (ref 8.4–10.5)
Chloride: 102 mEq/L (ref 96–112)
Creatinine, Ser: 0.77 mg/dL (ref 0.40–1.20)
GFR: 76.56 mL/min (ref >60.00)
Glucose, Bld: 95 mg/dL (ref 70–99)
Potassium: 3.8 mEq/L (ref 3.5–5.1)
Sodium: 142 mEq/L (ref 135–145)

## 2022-04-06 LAB — TSH: TSH: 1.37 u[IU]/mL (ref 0.35–5.50)

## 2022-04-06 LAB — HEPATIC FUNCTION PANEL
ALT: 27 U/L (ref 0–35)
AST: 18 U/L (ref 0–37)
Albumin: 4 g/dL (ref 3.5–5.2)
Alkaline Phosphatase: 67 U/L (ref 39–117)
Bilirubin, Direct: 0.1 mg/dL (ref 0.0–0.3)
Total Bilirubin: 0.8 mg/dL (ref 0.2–1.2)
Total Protein: 7 g/dL (ref 6.0–8.3)

## 2022-04-06 LAB — HEMOGLOBIN A1C: Hgb A1c MFr Bld: 5.6 % (ref 4.6–6.5)

## 2022-04-09 ENCOUNTER — Encounter: Payer: Self-pay | Admitting: Internal Medicine

## 2022-04-09 ENCOUNTER — Ambulatory Visit (INDEPENDENT_AMBULATORY_CARE_PROVIDER_SITE_OTHER): Payer: Medicare Other

## 2022-04-09 ENCOUNTER — Ambulatory Visit (INDEPENDENT_AMBULATORY_CARE_PROVIDER_SITE_OTHER): Payer: Medicare Other | Admitting: Internal Medicine

## 2022-04-09 VITALS — BP 128/70 | HR 90 | Temp 97.9°F | Resp 16 | Ht 67.0 in | Wt 160.0 lb

## 2022-04-09 DIAGNOSIS — G8929 Other chronic pain: Secondary | ICD-10-CM

## 2022-04-09 DIAGNOSIS — R739 Hyperglycemia, unspecified: Secondary | ICD-10-CM | POA: Diagnosis not present

## 2022-04-09 DIAGNOSIS — C8590 Non-Hodgkin lymphoma, unspecified, unspecified site: Secondary | ICD-10-CM

## 2022-04-09 DIAGNOSIS — M4696 Unspecified inflammatory spondylopathy, lumbar region: Secondary | ICD-10-CM

## 2022-04-09 DIAGNOSIS — K219 Gastro-esophageal reflux disease without esophagitis: Secondary | ICD-10-CM

## 2022-04-09 DIAGNOSIS — R059 Cough, unspecified: Secondary | ICD-10-CM

## 2022-04-09 DIAGNOSIS — R1310 Dysphagia, unspecified: Secondary | ICD-10-CM | POA: Diagnosis not present

## 2022-04-09 DIAGNOSIS — E7800 Pure hypercholesterolemia, unspecified: Secondary | ICD-10-CM | POA: Diagnosis not present

## 2022-04-09 DIAGNOSIS — M545 Low back pain, unspecified: Secondary | ICD-10-CM

## 2022-04-09 DIAGNOSIS — Z1231 Encounter for screening mammogram for malignant neoplasm of breast: Secondary | ICD-10-CM | POA: Diagnosis not present

## 2022-04-09 DIAGNOSIS — Z1211 Encounter for screening for malignant neoplasm of colon: Secondary | ICD-10-CM | POA: Insufficient documentation

## 2022-04-09 DIAGNOSIS — D649 Anemia, unspecified: Secondary | ICD-10-CM

## 2022-04-09 MED ORDER — IPRATROPIUM BROMIDE 0.03 % NA SOLN: 2.0000 | Freq: Two times a day (BID) | NASAL | 1 refills | Status: DC

## 2022-04-09 MED ORDER — PREDNISONE 10 MG PO TABS: ORAL_TABLET | ORAL | 0 refills | Status: DC

## 2022-04-09 NOTE — Progress Notes (Signed)
Subjective:    Patient ID: Maria Keller, female    DOB: 04/17/1950, 72 y.o.   MRN: [id number]  Patient here for  Chief Complaint  Patient presents with   Medical Management of Chronic Issues    HPI Here for f/u regarding hypercholesterolemia, GERD and cough. Has seen [facility] 01/03/22 - compression hose - for lymphedema.  02/28/22 - visit - had reported some issues with congestion - phlegm throat.  Taking mucinex and using flonase.  Reports persistent cough.  Some drainage.  Some congestion - clear.  Some trouble swallowing.  No chest pain.  No increased sob.  Off pain medication. Followed by pain management - nerve block.     Past Medical History:  Diagnosis Date   Anemia    iron deficiency and B12 deficiency   Arthritis    Bronchitis 08/2015   Chronic back pain    followed by Dr Primus Bravo   Depression    Diverticulosis    Esophageal stricture    requiring dilatation x 2   Fibromyalgia    Fibromyalgia    Fibromyalgia    Follicular lymphoma ([place])    Followed by Dr Leretha Pol, s/p chemo and XRT   Follicular lymphoma ([place])    GERD (gastroesophageal reflux disease)    GERD (gastroesophageal reflux disease)    Hemorrhoid    Hiatal hernia    History of kidney stones    Hypercholesterolemia    IBS (irritable bowel syndrome)    IBS (irritable bowel syndrome)    Nephrolithiasis    followed by Dr Bernardo Heater, s/p stents   PONV (postoperative nausea and vomiting)    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   Shingles outbreak 11/26/2011   Sleep apnea    Past Surgical History:  Procedure Laterality Date   APPENDECTOMY     BLADDER SURGERY  1993   BREAST BIOPSY N/A    capsule endoscopy     CHOLECYSTECTOMY  1995   COLONOSCOPY WITH PROPOFOL N/A 06/19/2017   Procedure: COLONOSCOPY WITH PROPOFOL;  Surgeon: Manya Silvas, MD;  Location: [facility] ENDOSCOPY;  Service: Endoscopy;  Laterality: N/A;   ESOPHAGOGASTRODUODENOSCOPY (EGD) WITH PROPOFOL N/A  06/19/2017   Procedure: ESOPHAGOGASTRODUODENOSCOPY (EGD) WITH PROPOFOL;  Surgeon: Manya Silvas, MD;  Location: [facility] ENDOSCOPY;  Service: Endoscopy;  Laterality: N/A;   EYE SURGERY     both cataract remove   FRACTURE SURGERY Left    ankle, plate with 8 screws   history of helicobacter pylori infection     JOINT REPLACEMENT Right    knee   LEFT OOPHORECTOMY     LEG SURGERY Left    ORIF ANKLE FRACTURE  2012   OVARIAN CYST REMOVAL     right   RECTOCELE REPAIR N/A    REPLACEMENT TOTAL KNEE Right 03/19/2016   TONSILECTOMY/ADENOIDECTOMY WITH MYRINGOTOMY  1970   TONSILLECTOMY N/A    TOTAL KNEE REVISION Right 11/07/2021   Procedure: TOTAL KNEE REVISION;  Surgeon: Corky Mull, MD;  Location: [facility] ORS;  Service: Orthopedics;  Laterality: Right;   TUBAL LIGATION  1976   VAGINAL HYSTERECTOMY  1980's   secondary to bleeding   Family History  Problem Relation Age of Onset   Ulcers Mother    Ulcers Father    Breast cancer Other        great aunt and grandfather   Lung cancer Other        uncle   Hypertension Maternal Grandmother  Heart disease Maternal Grandmother        MI 84s   Diabetes Maternal Grandmother    Arthritis Maternal Grandmother    Diabetes Maternal Grandfather    Social History   Socioeconomic History   Marital status: Married    Spouse name: [name]   Number of children: Not on file   Years of education: Not on file   Highest education level: Not on file  Occupational History   Not on file  Tobacco Use   Smoking status: Never   Smokeless tobacco: Never  Vaping Use   Vaping Use: Never used  Substance and Sexual Activity   Alcohol use: No    Alcohol/week: 0.0 standard drinks of alcohol   Drug use: No   Sexual activity: Yes  Other Topics Concern   Not on file  Social History Narrative   Not on file   Social Determinants of Health   Financial Resource Strain: Low Risk  (08/23/2021)   Overall Financial Resource Strain (CARDIA)    Difficulty of  Paying Living Expenses: Not hard at all  Food Insecurity: No Food Insecurity (11/07/2021)   Hunger Vital Sign    Worried About Running Out of Food in the Last Year: Never true    Ran Out of Food in the Last Year: Never true  Transportation Needs: No Transportation Needs (11/07/2021)   PRAPARE - Hydrologist (Medical): No    Lack of Transportation (Non-Medical): No  Physical Activity: Insufficiently Active (08/19/2018)   Exercise Vital Sign    Days of Exercise per Week: 1 day    Minutes of Exercise per Session: 30 min  Stress: No Stress Concern Present (08/23/2021)   [place]    Feeling of Stress : Not at all  Social Connections: [place] (08/23/2021)   Social Connection and Isolation Panel [NHANES]    Frequency of Communication with Friends and Family: More than three times a week    Frequency of Social Gatherings with Friends and Family: More than three times a week    Attends Religious Services: More than 4 times per year    Active Member of [employer] or Organizations: Not on file    Attends Music therapist: More than 4 times per year    Marital Status: Married     Review of Systems  Constitutional:  Negative for appetite change, fever and unexpected weight change.  HENT:  Positive for congestion and postnasal drip.   Respiratory:  Positive for cough. Negative for chest tightness and shortness of breath.   Cardiovascular:  Negative for chest pain and palpitations.  Gastrointestinal:  Negative for abdominal pain, diarrhea, nausea and vomiting.  Genitourinary:  Negative for difficulty urinating and dysuria.  Musculoskeletal:  Positive for back pain. Negative for joint swelling and myalgias.  Skin:  Negative for color change and rash.  Neurological:  Negative for dizziness and headaches.  Psychiatric/Behavioral:  Negative for agitation and dysphoric mood.         Objective:     BP 128/70   Pulse 90   Temp 97.9 F (36.6 C)   Resp 16   Ht '5\' 7"'$  (1.702 m)   Wt 160 lb (72.6 kg)   SpO2 98%   BMI 25.06 kg/m  Wt Readings from Last 3 Encounters:  04/09/22 160 lb (72.6 kg)  04/05/22 168 lb (76.2 kg)  03/20/22 168 lb (76.2 kg)    Physical Exam Vitals  reviewed.  Constitutional:      General: She is not in acute distress.    Appearance: Normal appearance.  HENT:     Head: Normocephalic and atraumatic.     Right Ear: External ear normal.     Left Ear: External ear normal.  Eyes:     General: No scleral icterus.       Right eye: No discharge.        Left eye: No discharge.     Conjunctiva/sclera: Conjunctivae normal.  Neck:     Thyroid: No thyromegaly.  Cardiovascular:     Rate and Rhythm: Normal rate and regular rhythm.  Pulmonary:     Effort: No respiratory distress.     Breath sounds: Normal breath sounds. No wheezing.  Abdominal:     General: Bowel sounds are normal.     Palpations: Abdomen is soft.     Tenderness: There is no abdominal tenderness.  Musculoskeletal:        General: No swelling or tenderness.     Cervical back: Neck supple. No tenderness.  Lymphadenopathy:     Cervical: No cervical adenopathy.  Skin:    Findings: No erythema or rash.  Neurological:     Mental Status: She is alert.  Psychiatric:        Mood and Affect: Mood normal.        Behavior: Behavior normal.      Outpatient Encounter Medications as of 04/09/2022  Medication Sig   ipratropium (ATROVENT) 0.03 % nasal spray Place 2 sprays into both nostrils every 12 (twelve) hours.   predniSONE (DELTASONE) 10 MG tablet Take 4 tablets x 1 day and then decrease by 1/2 tablet per day until down to zero mg.   acetaminophen (TYLENOL) 650 MG CR tablet Take 650 mg by mouth every 8 (eight) hours as needed for pain.   aspirin EC 81 MG tablet Take 81 mg by mouth daily.   calcium carbonate (OS-CAL) 1250 (500 Ca) MG chewable tablet Chew 1 tablet by mouth daily.    cetirizine (ZYRTEC) 10 MG tablet Take by mouth.   cholecalciferol (VITAMIN D3) 25 MCG (1000 UNIT) tablet Take 1,000 Units by mouth daily.   estradiol (ESTRACE) 0.5 MG tablet TAKE 1 TABLET BY MOUTH DAILY.   fluticasone (FLONASE) 50 MCG/ACT nasal spray Place 2 sprays into both nostrils daily.   furosemide (LASIX) 20 MG tablet TAKE 1 TABLET BY MOUTH DAILY AS NEEDED.   Multiple Vitamin (MULTIVITAMIN) tablet Take 1 tablet by mouth daily.   mupirocin ointment (BACTROBAN) 2 % Apply 1 Application topically 2 (two) times daily.   omeprazole (PRILOSEC) 20 MG capsule TAKE 1 CAPSULE BY MOUTH TWO TIMES DAILY   senna-docusate (SENOKOT-S) 8.6-50 MG per tablet Take by mouth daily.   [DISCONTINUED] atorvastatin (LIPITOR) 10 MG tablet TAKE 1 TABLET BY MOUTH DAILY.   [DISCONTINUED] azelastine (ASTELIN) 0.1 % nasal spray 1 SPRAY IN EACH NOSTRIL TWICE DAILY AS DIRECTED   No facility-administered encounter medications on file as of 04/09/2022.     Lab Results  Component Value Date   WBC 11.2 (H) 04/06/2022   HGB 13.8 04/06/2022   HCT 40.4 04/06/2022   PLT 297.0 04/06/2022   GLUCOSE 95 04/06/2022   CHOL 228 (H) 04/06/2022   TRIG 113.0 04/06/2022   HDL 66.10 04/06/2022   LDLDIRECT 164.2 02/11/2013   LDLCALC 139 (H) 04/06/2022   ALT 27 04/06/2022   AST 18 04/06/2022   NA 142 04/06/2022   K 3.8 04/06/2022  CL 102 04/06/2022   CREATININE 0.77 04/06/2022   BUN 23 04/06/2022   CO2 29 04/06/2022   TSH 1.37 04/06/2022   HGBA1C 5.6 04/06/2022    DG PAIN CLINIC C-ARM 1-60 MIN NO REPORT  Result Date: 03/20/2022 Fluoro was used, but no Radiologist interpretation will be provided. Please refer to "NOTES" tab for provider progress note.      Assessment & Plan:  Hypercholesterolemia Assessment & Plan: On lipitor. Unable to titrate dose due to side effects. Has to hold on occasion.  Low cholesterol diet and exercise.  Follow lipid panel and liver function tests. Have discussed zetia and possible repatha  if problems with oral statins.  Lab Results  Component Value Date   CHOL 228 (H) 04/06/2022   HDL 66.10 04/06/2022   LDLCALC 139 (H) 04/06/2022   LDLDIRECT 164.2 02/11/2013   TRIG 113.0 04/06/2022   CHOLHDL 3 04/06/2022    Orders: -     Lipid panel; Future -     Hepatic function panel; Future -     Basic metabolic panel; Future  Hyperglycemia Assessment & Plan: Low carb diet and exercise.  Follow met b and a1c.  Orders: -     Hemoglobin A1c; Future  Visit for screening mammogram -     3D Screening Mammogram, Left and Right; Future  Cough, unspecified type Assessment & Plan: Cough and congestion as outlined.  Some increased cough with forced expiration.  Discussed.  Check cxr.  Atrovent nasal spray as directed.  Prednisone taper as directed.  Continue omeprazole.  Follow.  Call with update.   Orders: -     DG Chest 2 View; Future  Dysphagia, unspecified type Assessment & Plan: On omeprazole.  Did report she has noticed some trouble swallowing.  Due colonoscopy.  Refer to GI for evaluation of dysphagia and colon cancer screening. Question of need for EGD.   Orders: -     Ambulatory referral to Gastroenterology  Colon cancer screening Assessment & Plan: Due colonoscopy.  Refer to GI.   Orders: -     Ambulatory referral to Gastroenterology  Anemia, unspecified type Assessment & Plan: Follow cbc.    Chronic low back pain (1ry area of Pain) (Bilateral) (R>L) w/o sciatica Assessment & Plan: History of chronic back pain.  Followed by pain clinic.  Off pain medication.     Gastro-esophageal reflux disease without esophagitis Assessment & Plan: On omeprazole.  Continue.  Dysphagia as outlined.  Refer to GI.      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[facility]) Assessment & Plan: Chronic back pain.  Followed by pain clinic. S/p recent nerve block.    Non-Hodgkin's lymphoma, unspecified body region, unspecified non-Hodgkin lymphoma type [facility]) Assessment &  Plan: Followed by Dr Leretha Pol.  Stable.  Follows up yearly.    Other orders -     Ipratropium Bromide; Place 2 sprays into both nostrils every 12 (twelve) hours.  Dispense: 30 mL; Refill: 1 -     predniSONE; Take 4 tablets x 1 day and then decrease by 1/2 tablet per day until down to zero mg.  Dispense: 18 tablet; Refill: 0     Einar Pheasant, MD

## 2022-04-10 ENCOUNTER — Other Ambulatory Visit: Payer: Self-pay

## 2022-04-10 MED ORDER — ATORVASTATIN CALCIUM 10 MG PO TABS: 10.0000 mg | ORAL_TABLET | Freq: Every day | ORAL | 2 refills | Status: DC

## 2022-04-13 ENCOUNTER — Encounter: Payer: Self-pay | Admitting: Internal Medicine

## 2022-04-15 ENCOUNTER — Encounter: Payer: Self-pay | Admitting: Internal Medicine

## 2022-04-15 NOTE — Assessment & Plan Note (Signed)
Low carb diet and exercise.  Follow met b and a1c.   

## 2022-04-15 NOTE — Assessment & Plan Note (Signed)
Due colonoscopy.  Refer to GI.

## 2022-04-15 NOTE — Assessment & Plan Note (Signed)
On omeprazole.  Did report she has noticed some trouble swallowing.  Due colonoscopy.  Refer to GI for evaluation of dysphagia and colon cancer screening. Question of need for EGD.

## 2022-04-15 NOTE — Assessment & Plan Note (Signed)
On lipitor. Unable to titrate dose due to side effects. Has to hold on occasion.  Low cholesterol diet and exercise.  Follow lipid panel and liver function tests. Have discussed zetia and possible repatha if problems with oral statins.  Lab Results  Component Value Date   CHOL 228 (H) 04/06/2022   HDL 66.10 04/06/2022   LDLCALC 139 (H) 04/06/2022   LDLDIRECT 164.2 02/11/2013   TRIG 113.0 04/06/2022   CHOLHDL 3 04/06/2022

## 2022-04-15 NOTE — Assessment & Plan Note (Signed)
Chronic back pain.  Followed by pain clinic. S/p recent nerve block.  

## 2022-04-15 NOTE — Assessment & Plan Note (Signed)
Follow cbc.  

## 2022-04-15 NOTE — Assessment & Plan Note (Signed)
History of chronic back pain.  Followed by pain clinic.  Off pain medication.

## 2022-04-15 NOTE — Assessment & Plan Note (Signed)
Followed by Dr DeCastro.  Stable.  Follows up yearly.  

## 2022-04-15 NOTE — Assessment & Plan Note (Signed)
Cough and congestion as outlined.  Some increased cough with forced expiration.  Discussed.  Check cxr.  Atrovent nasal spray as directed.  Prednisone taper as directed.  Continue omeprazole.  Follow.  Call with update.

## 2022-04-15 NOTE — Assessment & Plan Note (Signed)
On omeprazole.  Continue.  Dysphagia as outlined.  Refer to GI.

## 2022-04-16 ENCOUNTER — Telehealth: Payer: Self-pay | Admitting: Pain Medicine

## 2022-04-16 NOTE — Telephone Encounter (Signed)
Patient wants to speak with a nurse before scheduling her RFA.

## 2022-04-16 NOTE — Telephone Encounter (Signed)
Patient states that she has had a round of Prednisone and some drainage in her throat but it has gotten better.  Informed patinet that she would be ok to get her RFA but to let us know if anything changes.  If she starts running a fever or increased drainiage or has to be put on antibiotics, call us and we will reevaluate.

## 2022-04-18 DIAGNOSIS — H35373 Puckering of macula, bilateral: Secondary | ICD-10-CM | POA: Diagnosis not present

## 2022-04-18 DIAGNOSIS — H524 Presbyopia: Secondary | ICD-10-CM | POA: Diagnosis not present

## 2022-04-24 NOTE — Progress Notes (Addendum)
PROVIDER NOTE: Interpretation of information contained herein should be left to medically-trained personnel. Specific patient instructions are provided elsewhere under "Patient Instructions" section of medical record. This document was created in part using STT-dictation technology, any transcriptional errors that may result from this process are unintentional.  Patient: Maria Keller Type: Established DOB: [birth year] MRN: [id number] PCP: Dale Gotebo, MD  Service: Procedure DOS: 04/26/2022 Setting: Ambulatory Location: Ambulatory outpatient facility Delivery: Face-to-face Provider: Oswaldo Done, MD Specialty: Interventional Pain Management Specialty designation: 09 Location: Outpatient facility Ref. Prov.: Dale Kandiyohi, MD       Interventional Therapy   Procedure: Lumbar Facet, Medial Branch Radiofrequency Ablation (RFA) #1  Laterality: Right (-RT)  Level: L2, L3, L4, L5, and S1 Medial Branch Level(s). These levels will denervate the L3-4, L4-5, and L5-S1 lumbar facet joints.  Imaging: Fluoroscopy-guided         Anesthesia: Local anesthesia (1-2% Lidocaine) Anxiolysis: IV Versed 3.0 mg Sedation: Moderate Sedation None required. No Fentanyl administered.         DOS: 04/26/2022  Performed by: Oswaldo Done, MD  Purpose: Therapeutic/Palliative Indications: Low back pain severe enough to impact quality of life or function. Indications: 1. Lumbar facet syndrome   2. Lumbosacral facet arthropathy   3. Spondylosis without myelopathy or radiculopathy, lumbosacral region   4. Grade 1 Anterolisthesis of lumbosacral spine of L5/S1   5. DDD (degenerative disc disease), lumbosacral   6. Chronic low back pain (1ry area of Pain) (Bilateral) (R>L) w/o sciatica   7. History of total knee replacement (Right)    Maria Keller has been dealing with the above chronic pain for longer than three months and has either failed to respond, was unable to tolerate, or simply did not get enough  benefit from other more conservative therapies including, but not limited to: 1. Over-the-counter medications 2. Anti-inflammatory medications 3. Muscle relaxants 4. Membrane stabilizers 5. Opioids 6. Physical therapy and/or chiropractic manipulation 7. Modalities (Heat, ice, etc.) 8. Invasive techniques such as nerve blocks. [name] has attained more than 50% relief of the pain from a series of diagnostic injections conducted in separate occasions.  Pain Score: Pre-procedure: 6 /10 Post-procedure: 0-No pain/10     Position / Prep / Materials:  Position: Prone  Prep solution: DuraPrep (Iodine Povacrylex [0.7% available iodine] and Isopropyl Alcohol, 74% w/w) Prep Area: Entire Lumbosacral Region (Lower back from mid-thoracic region to end of tailbone and from flank to flank.) Materials:  Tray: RFA (Radiofrequency) tray Needle(s):  Type: RFA (Teflon-coated radiofrequency ablation needles) Gauge (G): 22  Length: Regular (10cm) Qty: 4      Pre-op H&P Assessment:  Maria Keller is a 72 y.o. (year old), female patient, seen today for interventional treatment. 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 Maria Keller has a current medication list which includes the following prescription(s): acetaminophen, aspirin ec, atorvastatin, calcium carbonate, cetirizine,  cholecalciferol, estradiol, fluticasone, furosemide, hydrocodone-acetaminophen, [START ON 05/03/2022] hydrocodone-acetaminophen, ipratropium, multivitamin, mupirocin ointment, omeprazole, and senna-docusate, and the following Facility-Administered Medications: fentanyl and pentafluoroprop-tetrafluoroeth. Her primarily concern today is the Back Pain (  lower)  Initial Vital Signs:  Pulse/HCG Rate: 87ECG Heart Rate: 76 (NSR) Temp: (!) 97.4 F (36.3 C) Resp: 16 BP: 127/82 SpO2: 98 %  BMI: Estimated body mass index is 25.06 kg/m as calculated from the following:   Height as of this encounter: 5\' 7"  (1.702 m).   Weight as of this encounter: 160 lb (72.6 kg).  Risk Assessment: Allergies: Reviewed. She is allergic to oxycodone hcl, oxycontin [oxycodone hcl], penicillins, chloral hydrate, ciprofloxacin, ciprofloxacin hcl, colace [docusate calcium], methotrexate derivatives, parafon forte dsc [chlorzoxazone], penicillin g, tape, bextra [valdecoxib], oxycodone, and rofecoxib.  Allergy Precautions: None required Coagulopathies: Reviewed. None identified.  Blood-thinner therapy: None at this time Active Infection(s): Reviewed. None identified. Maria Keller is afebrile  Site Confirmation: Maria Keller was asked to confirm the procedure and laterality before marking the site Procedure checklist: Completed Consent: Before the procedure and under the influence of no sedative(s), amnesic(s), or anxiolytics, the patient was informed of the treatment options, risks and possible complications. To fulfill our ethical and legal obligations, as recommended by the American Medical Association's Code of Ethics, I have informed the patient of my clinical impression; the nature and purpose of the treatment or procedure; the risks, benefits, and possible complications of the intervention; the alternatives, including doing nothing; the risk(s) and benefit(s) of the alternative treatment(s) or procedure(s); and the risk(s) and  benefit(s) of doing nothing. The patient was provided information about the general risks and possible complications associated with the procedure. These may include, but are not limited to: failure to achieve desired goals, infection, bleeding, organ or nerve damage, allergic reactions, paralysis, and death. In addition, the patient was informed of those risks and complications associated to Spine-related procedures, such as failure to decrease pain; infection (i.e.: Meningitis, epidural or intraspinal abscess); bleeding (i.e.: epidural hematoma, subarachnoid hemorrhage, or any other type of intraspinal or peri-dural bleeding); organ or nerve damage (i.e.: Any type of peripheral nerve, nerve root, or spinal cord injury) with subsequent damage to sensory, motor, and/or autonomic systems, resulting in permanent pain, numbness, and/or weakness of one or several areas of the body; allergic reactions; (i.e.: anaphylactic reaction); and/or death. Furthermore, the patient was informed of those risks and complications associated with the medications. These include, but are not limited to: allergic reactions (i.e.: anaphylactic or anaphylactoid reaction(s)); adrenal axis suppression; blood sugar elevation that in diabetics may result in ketoacidosis or comma; water retention that in patients with history of congestive heart failure may result in shortness of breath, pulmonary edema, and decompensation with resultant heart failure; weight gain; swelling or edema; medication-induced neural toxicity; particulate matter embolism and blood vessel occlusion with resultant organ, and/or nervous system infarction; and/or aseptic necrosis of one or more joints. Finally, the patient was informed that Medicine is not an exact science; therefore, there is also the possibility of unforeseen or unpredictable risks and/or possible complications that may result in a catastrophic outcome. The patient indicated having understood very  clearly. We have given the patient no guarantees and we have made no promises. Enough time was given to the patient to ask questions, all of which were answered to the patient's satisfaction. Ms. Jetter has indicated that she wanted to continue with the procedure. Attestation: I, the ordering provider, attest that I have discussed with the patient the benefits, risks, side-effects, alternatives, likelihood of achieving goals, and potential problems during recovery for the procedure that I have provided informed consent. Date  Time: 04/26/2022  8:51 AM   Pre-Procedure Preparation:  Monitoring:  As per clinic protocol. Respiration, ETCO2, SpO2, BP, heart rate and rhythm monitor placed and checked for adequate function Safety Precautions: Patient was assessed for positional comfort and pressure points before starting the procedure. Time-out: I initiated and conducted the "Time-out" before starting the procedure, as per protocol. The patient was asked to participate by confirming the accuracy of the "Time Out" information. Verification of the correct person, site, and procedure were performed and confirmed by me, the nursing staff, and the patient. "Time-out" conducted as per Joint Commission's Universal Protocol (UP.01.01.01). Time: 1010  Description of Procedure:          Laterality: See above. Levels:  See above. Safety Precautions: Aspiration looking for blood return was conducted prior to all injections. At no point did we inject any substances, as a needle was being advanced. Before injecting, the patient was told to immediately notify me if she was experiencing any new onset of "ringing in the ears, or metallic taste in the mouth". No attempts were made at seeking any paresthesias. Safe injection practices and needle disposal techniques used. Medications properly checked for expiration dates. SDV (single dose vial) medications used. After the completion of the procedure, all disposable equipment used  was discarded in the proper designated medical waste containers. Local Anesthesia: Protocol guidelines were followed. The patient was positioned over the fluoroscopy table. The area was prepped in the usual manner. The time-out was completed. The target area was identified using fluoroscopy. A 12-in long, straight, sterile hemostat was used with fluoroscopic guidance to locate the targets for each level blocked. Once located, the skin was marked with an approved surgical skin marker. Once all sites were marked, the skin (epidermis, dermis, and hypodermis), as well as deeper tissues (fat, connective tissue and muscle) were infiltrated with a small amount of a short-acting local anesthetic, loaded on a 10cc syringe with a 25G, 1.5-in  Needle. An appropriate amount of time was allowed for local anesthetics to take effect before proceeding to the next step. Technical description of process:  Radiofrequency Ablation (RFA) L2 Medial Branch Nerve RFA: The target area for the L2 medial branch is at the junction of the postero-lateral aspect of the superior articular process and the superior, posterior, and medial edge of the transverse process of L3. Under fluoroscopic guidance, a Radiofrequency needle was inserted until contact was made with os over the superior postero-lateral aspect of the pedicular shadow (target area). Sensory and motor testing was conducted to properly adjust the position of the needle. Once satisfactory placement of the needle was achieved, the numbing solution was slowly injected after negative aspiration for blood. 2.0 mL of the nerve block solution was injected without difficulty or complication. After waiting for at least 3 minutes, the ablation was performed. Once completed, the needle was removed intact. L3 Medial Branch Nerve RFA: The target area for the L3 medial branch is at the junction of the postero-lateral aspect of the superior articular process and the superior, posterior, and  medial edge of the transverse process of L4. Under fluoroscopic guidance, a Radiofrequency needle was inserted until contact was made with os over the superior postero-lateral aspect of the pedicular shadow (target area). Sensory and motor testing was conducted to properly adjust the position of the needle. Once satisfactory placement of the needle was achieved, the numbing solution was slowly injected after negative aspiration for blood. 2.0 mL of the nerve block solution was injected without difficulty or complication. After waiting for at least 3 minutes, the ablation was  performed. Once completed, the needle was removed intact. L4 Medial Branch Nerve RFA: The target area for the L4 medial branch is at the junction of the postero-lateral aspect of the superior articular process and the superior, posterior, and medial edge of the transverse process of L5. Under fluoroscopic guidance, a Radiofrequency needle was inserted until contact was made with os over the superior postero-lateral aspect of the pedicular shadow (target area). Sensory and motor testing was conducted to properly adjust the position of the needle. Once satisfactory placement of the needle was achieved, the numbing solution was slowly injected after negative aspiration for blood. 2.0 mL of the nerve block solution was injected without difficulty or complication. After waiting for at least 3 minutes, the ablation was performed. Once completed, the needle was removed intact. L5 Medial Branch Nerve RFA: The target area for the L5 medial branch is at the junction of the postero-lateral aspect of the superior articular process of S1 and the superior, posterior, and medial edge of the sacral ala. Under fluoroscopic guidance, a Radiofrequency needle was inserted until contact was made with os over the superior postero-lateral aspect of the pedicular shadow (target area). Sensory and motor testing was conducted to properly adjust the position of the  needle. Once satisfactory placement of the needle was achieved, the numbing solution was slowly injected after negative aspiration for blood. 2.0 mL of the nerve block solution was injected without difficulty or complication. After waiting for at least 3 minutes, the ablation was performed. Once completed, the needle was removed intact. S1 Medial Branch Nerve RFA: The target area for the S1 medial branch is located inferior to the junction of the S1 superior articular process and the L5 inferior articular process, posterior, inferior, and lateral to the 6 o'clock position of the L5-S1 facet joint, just superior to the S1 posterior foramen. Under fluoroscopic guidance, the Radiofrequency needle was advanced until contact was made with os over the Target area. Sensory and motor testing was conducted to properly adjust the position of the needle. Once satisfactory placement of the needle was achieved, the numbing solution was slowly injected after negative aspiration for blood. 2.0 mL of the nerve block solution was injected without difficulty or complication. After waiting for at least 3 minutes, the ablation was performed. Once completed, the needle was removed intact. Radiofrequency lesioning (ablation):  Radiofrequency Generator: Medtronic AccurianTM AG 1000 RF Generator Sensory Stimulation Parameters: 50 Hz was used to locate & identify the nerve, making sure that the needle was positioned such that there was no sensory stimulation below 0.3 V or above 0.7 V. Motor Stimulation Parameters: 2 Hz was used to evaluate the motor component. Care was taken not to lesion any nerves that demonstrated motor stimulation of the lower extremities at an output of less than 2.5 times that of the sensory threshold, or a maximum of 2.0 V. Lesioning Technique Parameters: Standard Radiofrequency settings. (Not bipolar or pulsed.) Temperature Settings: 80 degrees C Lesioning time: 60 seconds Intra-operative Compliance:  Compliant  Once the entire procedure was completed, the treated area was cleaned, making sure to leave some of the prepping solution back to take advantage of its long term bactericidal properties.    Illustration of the posterior view of the lumbar spine and the posterior neural structures. Laminae of L2 through S1 are labeled. DPRL5, dorsal primary ramus of L5; DPRS1, dorsal primary ramus of S1; DPR3, dorsal primary ramus of L3; FJ, facet (zygapophyseal) joint L3-L4; I, inferior articular process of L4; LB1,  lateral branch of dorsal primary ramus of L1; IAB, inferior articular branches from L3 medial branch (supplies L4-L5 facet joint); IBP, intermediate branch plexus; MB3, medial branch of dorsal primary ramus of L3; NR3, third lumbar nerve root; S, superior articular process of L5; SAB, superior articular branches from L4 (supplies L4-5 facet joint also); TP3, transverse process of L3.  Vitals:   04/26/22 1038 04/26/22 1048 04/26/22 1100 04/26/22 1108  BP: 118/70 112/65 (!) 107/57 117/73  Pulse:      Resp: 15 16 18 16   Temp:      SpO2: 100% 99% 98% 98%  Weight:      Height:        Start Time: 1010 hrs. End Time: 1037 hrs.  Imaging Guidance (Spinal):          Type of Imaging Technique: Fluoroscopy Guidance (Spinal) Indication(s): Assistance in needle guidance and placement for procedures requiring needle placement in or near specific anatomical locations not easily accessible without such assistance. Exposure Time: Please see nurses notes. Contrast: None used. Fluoroscopic Guidance: I was personally present during the use of fluoroscopy. "Tunnel Vision Technique" used to obtain the best possible view of the target area. Parallax error corrected before commencing the procedure. "Direction-depth-direction" technique used to introduce the needle under continuous pulsed fluoroscopy. Once target was reached, antero-posterior, oblique, and lateral fluoroscopic projection used confirm needle  placement in all planes. Images permanently stored in EMR. Interpretation: No contrast injected. I personally interpreted the imaging intraoperatively. Adequate needle placement confirmed in multiple planes. Permanent images saved into the patient's record.  Antibiotic Prophylaxis:   Anti-infectives (From admission, onward)    Start     Dose/Rate Route Frequency Ordered Stop   04/26/22 0845  vancomycin (VANCOCIN) IVPB 1000 mg/200 mL premix       Note to Pharmacy: Pediatric Recommended Dose: 15 mg/kg   1,000 mg 200 mL/hr over 60 Minutes Intravenous  Once 04/26/22 0836 04/26/22 1025      Indication(s): None identified  Post-operative Assessment:  Post-procedure Vital Signs:  Pulse/HCG Rate: 8778 Temp: (!) 97.4 F (36.3 C) Resp: 16 BP: 117/73 SpO2: 98 %  EBL: None  Complications: No immediate post-treatment complications observed by team, or reported by patient.  Note: The patient tolerated the entire procedure well. A repeat set of vitals were taken after the procedure and the patient was kept under observation following institutional policy, for this type of procedure. Post-procedural neurological assessment was performed, showing return to baseline, prior to discharge. The patient was provided with post-procedure discharge instructions, including a section on how to identify potential problems. Should any problems arise concerning this procedure, the patient was given instructions to immediately contact us, at any time, without hesitation. In any case, we plan to contact the patient by telephone for a follow-up status report regarding this interventional procedure.  Comments:  No additional relevant information.  Plan of Care (POC)  Orders:  Orders Placed This Encounter  Procedures   Radiofrequency,Lumbar    Scheduling Instructions:     Side(s): Right-sided     Level: L4-5, and L5-S1 Facets (L3, L4, L5, and S1 Medial Branch)     Sedation: With Sedation.     Timeframe: Today     Order Specific Question:   Where will this procedure be performed?    Answer:   [facility]   Radiofrequency,Lumbar    Standing Status:   Future    Standing Expiration Date:   07/27/2022    Scheduling Instructions:  Side(s): Left-sided     Level: L4-5 and L5-S1 Facets (L3, L4, L5, and S1 Medial Branch)     Sedation: With Sedation.     Scheduling Timeframe: 2 weeks from now    Order Specific Question:   Where will this procedure be performed?    Answer:   [facility]   DG PAIN CLINIC C-ARM 1-60 MIN NO REPORT    Intraoperative interpretation by procedural physician at [facility].    Standing Status:   Standing    Number of Occurrences:   1    Order Specific Question:   Reason for exam:    Answer:   Assistance in needle guidance and placement for procedures requiring needle placement in or near specific anatomical locations not easily accessible without such assistance.   Informed Consent Details: Physician/Practitioner Attestation; Transcribe to consent form and obtain patient signature    Nursing Order: Transcribe to consent form and obtain patient signature. Note: Always confirm laterality of pain with Ms. Rana Snare, before procedure.    Order Specific Question:   Physician/Practitioner attestation of informed consent for procedure/surgical case    Answer:   I, the physician/practitioner, attest that I have discussed with the patient the benefits, risks, side effects, alternatives, likelihood of achieving goals and potential problems during recovery for the procedure that I have provided informed consent.    Order Specific Question:   Procedure    Answer:   Lumbar Facet Radiofrequency Ablation    Order Specific Question:   Physician/Practitioner performing the procedure    Answer:   Edwinna Rochette A. Laban Emperor, MD    Order Specific Question:   Indication/Reason    Answer:   Low Back Pain, with our without leg pain, due to Facet Joint Arthralgia (Joint Pain) known  as Lumbar Facet Syndrome, secondary to Lumbar, and/or Lumbosacral Spondylosis (Arthritis of the Spine), without myelopathy or radiculopathy (Nerve Damage).   Provide equipment / supplies at bedside    Procedure tray: "Radiofrequency Tray" Additional material: Large hemostat (x1); Small hemostat (x1); Towels (x8); 4x4 sterile sponge pack (x1) Needle type: Teflon-coated Radiofrequency Needle (Disposable  single use) Size: Regular Quantity: 5    Standing Status:   Standing    Number of Occurrences:   1    Order Specific Question:   Specify    Answer:   Radiofrequency Tray   Chronic Opioid Analgesic:  Oxycodone IR 5 mg tablet, 1 tab p.o. twice daily.  (Still tapering down on 02/14/2022). MME/day: Down to 15 mg/day from 487.5 mg/day   Medications ordered for procedure: Meds ordered this encounter  Medications   lidocaine (XYLOCAINE) 2 % (with pres) injection 400 mg   pentafluoroprop-tetrafluoroeth (GEBAUERS) aerosol   lactated ringers infusion   midazolam (VERSED) 5 MG/5ML injection 0.5-2 mg    Make sure Flumazenil is available in the pyxis when using this medication. If oversedation occurs, administer 0.2 mg IV over 15 sec. If after 45 sec no response, administer 0.2 mg again over 1 min; may repeat at 1 min intervals; not to exceed 4 doses (1 mg)   fentaNYL (SUBLIMAZE) injection 25-50 mcg    Make sure Narcan is available in the pyxis when using this medication. In the event of respiratory depression (RR< 8/min): Titrate NARCAN (naloxone) in increments of 0.1 to 0.2 mg IV at 2-3 minute intervals, until desired degree of reversal.   ropivacaine (PF) 2 mg/mL (0.2%) (NAROPIN) injection 9 mL   triamcinolone acetonide (KENALOG-40) injection 40 mg   vancomycin (VANCOCIN) IVPB  1000 mg/200 mL premix    Pediatric Recommended Dose: 15 mg/kg    Order Specific Question:   Indication:    Answer:   Other Indication (list below)    Order Specific Question:   Other Indication:    Answer:   Procedure  Prophylaxis   HYDROcodone-acetaminophen (NORCO/VICODIN) 5-325 MG tablet    Sig: Take 1 tablet by mouth every 8 (eight) hours as needed for up to 7 days for severe pain. Must last 7 days.    Dispense:  21 tablet    Refill:  0    For acute post-operative pain. Not to be refilled. Must last 7 days.   HYDROcodone-acetaminophen (NORCO/VICODIN) 5-325 MG tablet    Sig: Take 1 tablet by mouth every 8 (eight) hours as needed for up to 7 days for severe pain. Must last for 7 days.    Dispense:  21 tablet    Refill:  0    For acute post-operative pain. Not to be refilled. Must last 7 days.   Medications administered: We administered lidocaine, lactated ringers, midazolam, ropivacaine (PF) 2 mg/mL (0.2%), triamcinolone acetonide, and vancomycin.  See the medical record for exact dosing, route, and time of administration.  Follow-up plan:   Return in about 2 weeks (around 05/10/2022) for (ECT): (L) L-FCT RFA #1, (PPE).       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 Ewing Schlein    Therapeutic  Palliative (PRN) options:   None established   Pharmacotherapy  2D6 Metabolic Activity: Intermediate 2C19 Metabolic Activity: Normal 3A4 Metabolic Activity: Normal 3A5 Metabolic Activity: Poor patient of Dr. Ewing Schlein who had her on opioid analgesics totaling 487.5 MME/day by 10/12/2021.  Slow taper started 02/14/2022.  Patient is currently completely off of all opioids.          Recent Visits Date Type Provider Dept  04/05/22 Office Visit Delano Metz, MD [facility]  03/20/22 Procedure visit Delano Metz, MD [facility]  03/13/22 Office Visit Delano Metz, MD [facility]  02/22/22 Procedure visit Delano Metz, MD [facility]  02/14/22 Office Visit Delano Metz, MD [facility]  Showing recent visits within past 90 days and meeting all other requirements Today's Visits Date Type Provider Dept  04/26/22 Procedure visit Delano Metz, MD [facility]  Showing today's visits and meeting all other requirements Future Appointments No visits were found meeting these conditions. Showing future appointments within next 90 days and meeting all other requirements  Disposition: Discharge home  Discharge (Date  Time): 04/26/2022; 1100 hrs.   Primary Care Physician: Dale Cleghorn, MD Location: [facility] Note by: Oswaldo Done, MD (TTS technology used. I apologize for any typographical errors that were not detected and corrected.) Date: 04/26/2022; Time: 1:43 PM  Disclaimer:  Medicine is not an Visual merchandiser. The only guarantee in medicine is that nothing is guaranteed. It  is important to note that the decision to proceed with this intervention was based on the information collected from the patient. The Data and conclusions were drawn from the patient's questionnaire, the interview, and the physical examination. Because the information was provided in large part by the patient, it cannot be guaranteed that it has not been purposely or unconsciously manipulated. Every effort has been made to obtain as much relevant data as possible for this evaluation. It is important to note that the conclusions that lead to this procedure are derived in large part from the available data. Always  take into account that the treatment will also be dependent on availability of resources and existing treatment guidelines, considered by other Pain Management Practitioners as being common knowledge and practice, at the time of the intervention. For Medico-Legal purposes, it is also important to point out that variation in procedural techniques and pharmacological choices are the acceptable norm. The indications, contraindications, technique, and results of the above procedure should only be interpreted and judged by a Board-Certified Interventional Pain Specialist with extensive familiarity and expertise in the same exact procedure and technique.

## 2022-04-26 ENCOUNTER — Encounter: Payer: Self-pay | Admitting: Pain Medicine

## 2022-04-26 ENCOUNTER — Ambulatory Visit
Admission: RE | Admit: 2022-04-26 | Discharge: 2022-04-26 | Disposition: A | Discharge status: Home / Self Care | Payer: Medicare Other | Source: Ambulatory Visit | Attending: Pain Medicine | Admitting: Pain Medicine

## 2022-04-26 ENCOUNTER — Ambulatory Visit: Payer: Medicare Other | Attending: Pain Medicine | Admitting: Pain Medicine

## 2022-04-26 VITALS — BP 117/73 | HR 87 | Temp 97.4°F | Resp 16 | Ht 67.0 in | Wt 160.0 lb

## 2022-04-26 DIAGNOSIS — G8918 Other acute postprocedural pain: Secondary | ICD-10-CM | POA: Diagnosis not present

## 2022-04-26 DIAGNOSIS — M5137 Other intervertebral disc degeneration, lumbosacral region: Secondary | ICD-10-CM | POA: Insufficient documentation

## 2022-04-26 DIAGNOSIS — M47817 Spondylosis without myelopathy or radiculopathy, lumbosacral region: Secondary | ICD-10-CM | POA: Diagnosis not present

## 2022-04-26 DIAGNOSIS — M47816 Spondylosis without myelopathy or radiculopathy, lumbar region: Secondary | ICD-10-CM | POA: Diagnosis not present

## 2022-04-26 DIAGNOSIS — M4317 Spondylolisthesis, lumbosacral region: Secondary | ICD-10-CM | POA: Diagnosis not present

## 2022-04-26 DIAGNOSIS — M545 Low back pain, unspecified: Secondary | ICD-10-CM | POA: Insufficient documentation

## 2022-04-26 DIAGNOSIS — Z96651 Presence of right artificial knee joint: Secondary | ICD-10-CM | POA: Diagnosis not present

## 2022-04-26 DIAGNOSIS — G8929 Other chronic pain: Secondary | ICD-10-CM | POA: Diagnosis not present

## 2022-04-26 MED ORDER — VANCOMYCIN HCL IN DEXTROSE 1-5 GM/200ML-% IV SOLN
1000.0000 mg | Freq: Once | INTRAVENOUS | Status: AC
  Administered 2022-04-26: 1000 mg via INTRAVENOUS
  Filled 2022-04-26: qty 200

## 2022-04-26 MED ORDER — LIDOCAINE HCL 2 % IJ SOLN
INTRAMUSCULAR | Status: AC
  Filled 2022-04-26: qty 20

## 2022-04-26 MED ORDER — ROPIVACAINE HCL 2 MG/ML IJ SOLN
9.0000 mL | Freq: Once | INTRAMUSCULAR | Status: AC
  Administered 2022-04-26: 9 mL via PERINEURAL

## 2022-04-26 MED ORDER — MIDAZOLAM HCL 5 MG/5ML IJ SOLN
0.5000 mg | Freq: Once | INTRAMUSCULAR | Status: AC
  Administered 2022-04-26: 3 mg via INTRAVENOUS

## 2022-04-26 MED ORDER — MIDAZOLAM HCL 5 MG/5ML IJ SOLN
INTRAMUSCULAR | Status: AC
  Filled 2022-04-26: qty 5

## 2022-04-26 MED ORDER — HYDROCODONE-ACETAMINOPHEN 5-325 MG PO TABS: 1.0000 | ORAL_TABLET | Freq: Three times a day (TID) | ORAL | 0 refills | Status: AC | PRN

## 2022-04-26 MED ORDER — TRIAMCINOLONE ACETONIDE 40 MG/ML IJ SUSP
INTRAMUSCULAR | Status: AC
  Filled 2022-04-26: qty 1

## 2022-04-26 MED ORDER — ROPIVACAINE HCL 2 MG/ML IJ SOLN
INTRAMUSCULAR | Status: AC
  Filled 2022-04-26: qty 20

## 2022-04-26 MED ORDER — TRIAMCINOLONE ACETONIDE 40 MG/ML IJ SUSP
40.0000 mg | Freq: Once | INTRAMUSCULAR | Status: AC
  Administered 2022-04-26: 40 mg

## 2022-04-26 MED ORDER — LACTATED RINGERS IV SOLN: Freq: Once | INTRAVENOUS | Status: AC

## 2022-04-26 MED ORDER — LIDOCAINE HCL 2 % IJ SOLN
20.0000 mL | Freq: Once | INTRAMUSCULAR | Status: AC
  Administered 2022-04-26: 400 mg

## 2022-04-26 MED ORDER — FENTANYL CITRATE (PF) 100 MCG/2ML IJ SOLN
INTRAMUSCULAR | Status: AC
  Filled 2022-04-26: qty 2

## 2022-04-26 MED ORDER — FENTANYL CITRATE (PF) 100 MCG/2ML IJ SOLN: 25.0000 ug | INTRAMUSCULAR | Status: DC | PRN

## 2022-04-26 MED ORDER — PENTAFLUOROPROP-TETRAFLUOROETH EX AERO: INHALATION_SPRAY | Freq: Once | CUTANEOUS | Status: DC

## 2022-04-26 NOTE — Patient Instructions (Signed)
___________________________________________________________________________________________  Post-Radiofrequency (RF) Discharge Instructions  You have just completed a Radiofrequency Neurotomy.  The following instructions will provide you with information and guidelines for self-care upon discharge.  If at any time you have questions or concerns please call your physician. DO NOT DRIVE YOURSELF!!  Instructions: Apply ice: Fill a plastic sandwich bag with crushed ice. Cover it with a small towel and apply to injection site. Apply for 15 minutes then remove x 15 minutes. Repeat sequence on day of procedure, until you go to bed. The purpose is to minimize swelling and discomfort after procedure. Apply heat: Apply heat to procedure site starting the day following the procedure. The purpose is to treat any soreness and discomfort from the procedure. Food intake: No eating limitations, unless stipulated above.  Nevertheless, if you have had sedation, you may experience some nausea.  In this case, it may be wise to wait at least two hours prior to resuming regular diet. Physical activities: Keep activities to a minimum for the first 8 hours after the procedure. For the first 24 hours after the procedure, do not drive a motor vehicle,  Operate heavy machinery, power tools, or handle any weapons.  Consider walking with the use of an assistive device or accompanied by an adult for the first 24 hours.  Do not drink alcoholic beverages including beer.  Do not make any important decisions or sign any legal documents. Go home and rest today.  Resume activities tomorrow, as tolerated.  Use caution in moving about as you may experience mild leg weakness.  Use caution in cooking, use of household electrical appliances and climbing steps. Driving: If you have received any sedation, you are not allowed to drive for 24 hours after your procedure. Blood thinner: Restart your blood thinner 6 hours after your procedure.  (Only for those taking blood thinners) Insulin: As soon as you can eat, you may resume your normal dosing schedule. (Only for those taking insulin) Medications: May resume pre-procedure medications.  Do not take any drugs, other than what has been prescribed to you. Infection prevention: Keep procedure site clean and dry. Post-procedure Pain Diary: Extremely important that this be done correctly and accurately. Recorded information will be used to determine the next step in treatment. Pain evaluated is that of treated area only. Do not include pain from an untreated area. Complete every hour, on the hour, for the initial 8 hours. Set an alarm to help you do this part accurately. Do not go to sleep and have it completed later. It will not be accurate. Follow-up appointment: Keep your follow-up appointment after the procedure. Usually 2-6 weeks after radiofrequency. Bring you pain diary. The information collected will be essential for your long-term care.   Expect: From numbing medicine (AKA: Local Anesthetics): Numbness or decrease in pain. Onset: Full effect within 15 minutes of injected. Duration: It will depend on the type of local anesthetic used. On the average, 1 to 8 hours.  From steroids (when added): Decrease in swelling or inflammation. Once inflammation is improved, relief of the pain will follow. Onset of benefits: Depends on the amount of swelling present. The more swelling, the longer it will take for the benefits to be seen. In some cases, up to 10 days. Duration: Steroids will stay in the system x 2 weeks. Duration of benefits will depend on multiple posibilities including persistent irritating factors. From procedure: Some discomfort is to be expected once the numbing medicine wears off. In the case of radiofrequency procedures,   this may last as long as 6 weeks. Additional post-procedure pain medication is provided for this. Discomfort is minimized if ice and heat are applied as  instructed.  Call if: You experience numbness and weakness that gets worse with time, as opposed to wearing off. He experience any unusual bleeding, difficulty breathing, or loss of the ability to control your bowel and bladder. (This applies to Spinal procedures only) You experience any redness, swelling, heat, red streaks, elevated temperature, fever, or any other signs of a possible infection.  Emergency Numbers: Durning business hours (Monday - Thursday, 8:00 AM - 4:00 PM) (Friday, 9:00 AM - 12:00 Noon): [phone] After hours: [phone] ____________________________________________________________________________________________   ______________________________________________________________________  Procedure instructions  Do not eat or drink fluids (other than water) for 6 hours before your procedure  No water for 2 hours before your procedure  Take your blood pressure medicine with a sip of water  Arrive 30 minutes before your appointment  Carefully read the "Preparing for your procedure" detailed instructions  If you have questions call us at [phone]  _____________________________________________________________________    ______________________________________________________________________  Preparing for your procedure  Appointments: If you think you may not be able to keep your appointment, call 24-48 hours in advance to cancel. We need time to make it available to others.  During your procedure appointment there will be: No Prescription Refills. No disability issues to discussed. No medication changes or discussions.  Instructions: Food intake: Avoid eating anything solid for at least 8 hours prior to your procedure. Clear liquid intake: You may take clear liquids such as water up to 2 hours prior to your procedure. (No carbonated drinks. No soda.) Transportation: Unless otherwise stated by your physician, bring a driver. Morning  Medicines: Except for blood thinners, take all of your other morning medications with a sip of water. Make sure to take your heart and blood pressure medicines. If your blood pressure's lower number is above 100, the case will be rescheduled. Blood thinners: Make sure to stop your blood thinners as instructed.  If you take a blood thinner, but were not instructed to stop it, call our office [phone] and ask to talk to a nurse. Not stopping a blood thinner prior to certain procedures could lead to serious complications. Diabetics on insulin: Notify the staff so that you can be scheduled 1st case in the morning. If your diabetes requires high dose insulin, take only  of your normal insulin dose the morning of the procedure and notify the staff that you have done so. Preventing infections: Shower with an antibacterial soap the morning of your procedure.  Build-up your immune system: Take 1000 mg of Vitamin C with every meal (3 times a day) the day prior to your procedure. Antibiotics: Inform the nursing staff if you are taking any antibiotics or if you have any conditions that may require antibiotics prior to procedures. (Example: recent joint implants)   Pregnancy: If you are pregnant make sure to notify the nursing staff. Not doing so may result in injury to the fetus, including death.  Sickness: If you have a cold, fever, or any active infections, call and cancel or reschedule your procedure. Receiving steroids while having an infection may result in complications. Arrival: You must be in the facility at least 30 minutes prior to your scheduled procedure. Tardiness: Your scheduled time is also the cutoff time. If you do not arrive at least 15 minutes prior to your procedure, you will be rescheduled.    Children: Do not bring any children with you. Make arrangements to keep them home. Dress appropriately: There is always a possibility that your clothing may get soiled. Avoid long dresses. Valuables:  Do not bring any jewelry or valuables.  Reasons to call and reschedule or cancel your procedure: (Following these recommendations will minimize the risk of a serious complication.) Surgeries: Avoid having procedures within 2 weeks of any surgery. (Avoid for 2 weeks before or after any surgery). Flu Shots: Avoid having procedures within 2 weeks of a flu shots or . (Avoid for 2 weeks before or after immunizations). Barium: Avoid having a procedure within 7-10 days after having had a radiological study involving the use of radiological contrast. (Myelograms, Barium swallow or enema study). Heart attacks: Avoid any elective procedures or surgeries for the initial 6 months after a "Myocardial Infarction" (Heart Attack). Blood thinners: It is imperative that you stop these medications before procedures. Let us know if you if you take any blood thinner.  Infection: Avoid procedures during or within two weeks of an infection (including chest colds or gastrointestinal problems). Symptoms associated with infections include: Localized redness, fever, chills, night sweats or profuse sweating, burning sensation when voiding, cough, congestion, stuffiness, runny nose, sore throat, diarrhea, nausea, vomiting, cold or Flu symptoms, recent or current infections. It is specially important if the infection is over the area that we intend to treat. Heart and lung problems: Symptoms that may suggest an active cardiopulmonary problem include: cough, chest pain, breathing difficulties or shortness of breath, dizziness, ankle swelling, uncontrolled high or unusually low blood pressure, and/or palpitations. If you are experiencing any of these symptoms, cancel your procedure and contact your primary care physician for an evaluation.  Remember:  Regular Business hours are:  Monday to Thursday 8:00 AM to 4:00 PM  Provider's Schedule: Mckyle Solanki, MD:  Procedure days: Tuesday and Thursday 7:30 AM to 4:00 PM  Bilal  Lateef, MD:  Procedure days: Monday and Wednesday 7:30 AM to 4:00 PM  ______________________________________________________________________    ____________________________________________________________________________________________  General Risks and Possible Complications  Patient Responsibilities: It is important that you read this as it is part of your informed consent. It is our duty to inform you of the risks and possible complications associated with treatments offered to you. It is your responsibility as a patient to read this and to ask questions about anything that is not clear or that you believe was not covered in this document.  Patient's Rights: You have the right to refuse treatment. You also have the right to change your mind, even after initially having agreed to have the treatment done. However, under this last option, if you wait until the last second to change your mind, you may be charged for the materials used up to that point.  Introduction: Medicine is not an exact science. Everything in Medicine, including the lack of treatment(s), carries the potential for danger, harm, or loss (which is by definition: Risk). In Medicine, a complication is a secondary problem, condition, or disease that can aggravate an already existing one. All treatments carry the risk of possible complications. The fact that a side effects or complications occurs, does not imply that the treatment was conducted incorrectly. It must be clearly understood that these can happen even when everything is done following the highest safety standards.  No treatment: You can choose not to proceed with the proposed treatment alternative. The "PRO(s)" would include: avoiding the risk of complications associated with the therapy. The "CON(s)" would   include: not getting any of the treatment benefits. These benefits fall under one of three categories: diagnostic; therapeutic; and/or palliative. Diagnostic benefits  include: getting information which can ultimately lead to improvement of the disease or symptom(s). Therapeutic benefits are those associated with the successful treatment of the disease. Finally, palliative benefits are those related to the decrease of the primary symptoms, without necessarily curing the condition (example: decreasing the pain from a flare-up of a chronic condition, such as incurable terminal cancer).  General Risks and Complications: These are associated to most interventional treatments. They can occur alone, or in combination. They fall under one of the following six (6) categories: no benefit or worsening of symptoms; bleeding; infection; nerve damage; allergic reactions; and/or death. No benefits or worsening of symptoms: In Medicine there are no guarantees, only probabilities. No healthcare provider can ever guarantee that a medical treatment will work, they can only state the probability that it may. Furthermore, there is always the possibility that the condition may worsen, either directly, or indirectly, as a consequence of the treatment. Bleeding: This is more common if the patient is taking a blood thinner, either prescription or over the counter (example: Goody Powders, Fish oil, Aspirin, Garlic, etc.), or if suffering a condition associated with impaired coagulation (example: Hemophilia, cirrhosis of the liver, low platelet counts, etc.). However, even if you do not have one on these, it can still happen. If you have any of these conditions, or take one of these drugs, make sure to notify your treating physician. Infection: This is more common in patients with a compromised immune system, either due to disease (example: diabetes, cancer, human immunodeficiency virus [HIV], etc.), or due to medications or treatments (example: therapies used to treat cancer and rheumatological diseases). However, even if you do not have one on these, it can still happen. If you have any of these  conditions, or take one of these drugs, make sure to notify your treating physician. Nerve Damage: This is more common when the treatment is an invasive one, but it can also happen with the use of medications, such as those used in the treatment of cancer. The damage can occur to small secondary nerves, or to large primary ones, such as those in the spinal cord and brain. This damage may be temporary or permanent and it may lead to impairments that can range from temporary numbness to permanent paralysis and/or brain death. Allergic Reactions: Any time a substance or material comes in contact with our body, there is the possibility of an allergic reaction. These can range from a mild skin rash (contact dermatitis) to a severe systemic reaction (anaphylactic reaction), which can result in death. Death: In general, any medical intervention can result in death, most of the time due to an unforeseen complication. ____________________________________________________________________________________________    

## 2022-04-26 NOTE — Progress Notes (Signed)
Safety precautions to be maintained throughout the outpatient stay will include: orient to surroundings, keep bed in low position, maintain call bell within reach at all times, provide assistance with transfer out of bed and ambulation.  

## 2022-04-27 ENCOUNTER — Telehealth: Payer: Self-pay

## 2022-04-27 NOTE — Telephone Encounter (Signed)
Post procedure follow up.  Patient states she is doing well.   ?

## 2022-05-01 ENCOUNTER — Other Ambulatory Visit: Payer: Self-pay | Admitting: Internal Medicine

## 2022-05-11 DIAGNOSIS — T84038S Mechanical loosening of other internal prosthetic joint, sequela: Secondary | ICD-10-CM | POA: Diagnosis not present

## 2022-05-11 DIAGNOSIS — Z96651 Presence of right artificial knee joint: Secondary | ICD-10-CM | POA: Diagnosis not present

## 2022-05-16 NOTE — Progress Notes (Unsigned)
PROVIDER NOTE: Interpretation of information contained herein should be left to medically-trained personnel. Specific patient instructions are provided elsewhere under "Patient Instructions" section of medical record. This document was created in part using STT-dictation technology, any transcriptional errors that may result from this process are unintentional.  Patient: Maria Keller Type: Established DOB: July 12, 1950 MRN: [id number] PCP: Dale [place], [place]  Service: Procedure DOS: 05/17/2022 Setting: Ambulatory Location: Ambulatory outpatient facility Delivery: Face-to-face Provider: Oswaldo Done, MD Specialty: Interventional Pain Management Specialty designation: 09 Location: Outpatient facility Ref. Prov.: Delano Metz, MD       Interventional Therapy   Procedure: Lumbar Facet, Medial Branch Radiofrequency Ablation (RFA) #1  Laterality: Left (-LT)  Level: L3, L4, L5, and S1 Medial Branch Level(s). These levels will denervate the L4-5 and L5-S1 lumbar facet joints.  Imaging: Fluoroscopy-guided         Anesthesia: Local anesthesia (1-2% Lidocaine) Anxiolysis: IV Versed 3.0 mg Sedation: Moderate Sedation None required. No Fentanyl administered.         DOS: 05/17/2022  Performed by: Oswaldo Done, MD  Purpose: Therapeutic/Palliative Indications: Low back pain severe enough to impact quality of life or function. Indications: 1. Lumbar facet syndrome   2. Lumbar facet joint pain   3. Osteoarthritis of facet joint of lumbar spine   4. Spondylosis without myelopathy or radiculopathy, lumbosacral region   5. Grade 1 Anterolisthesis of lumbosacral spine of L5/S1   6. Chronic low back pain (1ry area of Pain) (Bilateral) (R>L) w/o sciatica   7. DDD (degenerative disc disease), lumbosacral    Maria Keller has been dealing with the above chronic pain for longer than three months and has either failed to respond, was unable to tolerate, or simply did not get enough benefit from  other more conservative therapies including, but not limited to: 1. Over-the-counter medications 2. Anti-inflammatory medications 3. Muscle relaxants 4. Membrane stabilizers 5. Opioids 6. Physical therapy and/or chiropractic manipulation 7. Modalities (Heat, ice, etc.) 8. Invasive techniques such as nerve blocks. Maria Keller has attained more than 50% relief of the pain from a series of diagnostic injections conducted in separate occasions.  Pain Score: Pre-procedure: 4 /10 Post-procedure: 0-No pain/10     Position / Prep / Materials:  Position: Prone  Prep solution: DuraPrep (Iodine Povacrylex [0.7% available iodine] and Isopropyl Alcohol, 74% w/w) Prep Area: Entire Lumbosacral Region (Lower back from mid-thoracic region to end of tailbone and from flank to flank.) Materials:  Tray: RFA (Radiofrequency) tray Needle(s):  Type: RFA (Teflon-coated radiofrequency ablation needles) Gauge (G): 22  Length: Regular (10cm) Qty: 5      Post-procedure evaluation   Procedure: Lumbar Facet, Medial Branch Radiofrequency Ablation (RFA) #1  Laterality: Right (-RT)  Level: L2, L3, L4, L5, and S1 Medial Branch Level(s). These levels will denervate the L3-4, L4-5, and L5-S1 lumbar facet joints.  Imaging: Fluoroscopy-guided         Anesthesia: Local anesthesia (1-2% Lidocaine) Anxiolysis: IV Versed 3.0 mg Sedation: Moderate Sedation                       DOS: 04/26/2022  Performed by: Oswaldo Done, MD  Purpose: Therapeutic/Palliative Indications: Low back pain severe enough to impact quality of life or function.  Pain Score: Pre-procedure: 6 /10 Post-procedure: 0-No pain/10     Effectiveness:  Initial hour after procedure: 100 %. Subsequent 4-6 hours post-procedure: 80 %. Analgesia past initial 6 hours: 50 %. Ongoing improvement:  Analgesic:  It has not  been 6 weeks since RF, so she is still having postop discomfort, but she is already getting at least 50%  improvement. Function: Maria Keller reports improvement in function ROM: Maria Keller reports improvement in ROM  Pre-op H&P Assessment:  Maria Keller is a 72 y.o. (year old), female patient, seen today for interventional treatment. 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 Maria Keller has a current medication list which includes the following prescription(s): acetaminophen, aspirin ec, atorvastatin, calcium carbonate, cetirizine, cholecalciferol, estradiol, fluticasone, furosemide, hydrocodone-acetaminophen, [START ON 05/24/2022] hydrocodone-acetaminophen, ipratropium, multivitamin, mupirocin ointment, omeprazole, and senna-docusate, and the following Facility-Administered Medications: fentanyl, lactated ringers, and ropivacaine (pf) 2 mg/ml (0.2%). Her primarily concern today is the Back Pain (Left, lower back)  Initial Vital Signs:  Pulse/HCG Rate: 89ECG Heart Rate: 82 (NSR) Temp: (!) 97.4 F (36.3 C) Resp: 16 BP: 133/81 SpO2: 98 %  BMI: Estimated body mass index is 26.31 kg/m as calculated from the following:   Height as of this encounter: 5\' 7"  (1.702 m).   Weight as of this encounter: 168 lb (76.2 kg).  Risk Assessment: Allergies: Reviewed. She is allergic to oxycontin [oxycodone hcl], penicillins, chloral hydrate, ciprofloxacin, colace [docusate calcium],  methotrexate derivatives, parafon forte dsc [chlorzoxazone], penicillin g, tape, bextra [valdecoxib], oxycodone, and rofecoxib.  Allergy Precautions: None required Coagulopathies: Reviewed. None identified.  Blood-thinner therapy: None at this time Active Infection(s): Reviewed. None identified. Ms. Gerling is afebrile  Site Confirmation: Ms. Duerr was asked to confirm the procedure and laterality before marking the site Procedure checklist: Completed Consent: Before the procedure and under the influence of no sedative(s), amnesic(s), or anxiolytics, the patient was informed of the treatment options, risks and possible complications. To fulfill our ethical and legal obligations, as recommended by the American Medical Association's Code of Ethics, I have informed the patient of my clinical impression; the nature and purpose of the treatment or procedure; the risks, benefits, and possible complications of the intervention; the alternatives, including doing nothing; the risk(s) and benefit(s) of the alternative treatment(s) or procedure(s); and the risk(s) and benefit(s) of doing nothing. The patient was provided information about the general risks and possible complications associated with the procedure. These may include, but are not limited to: failure to achieve desired goals, infection, bleeding, organ or nerve damage, allergic reactions, paralysis, and death. In addition, the patient was informed of those risks and complications associated to Spine-related procedures, such as failure to decrease pain; infection (i.e.: Meningitis, epidural or intraspinal abscess); bleeding (i.e.: epidural hematoma, subarachnoid hemorrhage, or any other type of intraspinal or peri-dural bleeding); organ or nerve damage (i.e.: Any type of peripheral nerve, nerve root, or spinal cord injury) with subsequent damage to sensory, motor, and/or autonomic systems, resulting in permanent pain, numbness, and/or weakness of one or  several areas of the body; allergic reactions; (i.e.: anaphylactic reaction); and/or death. Furthermore, the patient was informed of those risks and complications associated with the medications. These include, but are not limited to: allergic reactions (i.e.: anaphylactic or anaphylactoid reaction(s)); adrenal axis suppression; blood sugar elevation that in diabetics may  result in ketoacidosis or comma; water retention that in patients with history of congestive heart failure may result in shortness of breath, pulmonary edema, and decompensation with resultant heart failure; weight gain; swelling or edema; medication-induced neural toxicity; particulate matter embolism and blood vessel occlusion with resultant organ, and/or nervous system infarction; and/or aseptic necrosis of one or more joints. Finally, the patient was informed that Medicine is not an exact science; therefore, there is also the possibility of unforeseen or unpredictable risks and/or possible complications that may result in a catastrophic outcome. The patient indicated having understood very clearly. We have given the patient no guarantees and we have made no promises. Enough time was given to the patient to ask questions, all of which were answered to the patient's satisfaction. Ms. Perret has indicated that she wanted to continue with the procedure. Attestation: I, the ordering provider, attest that I have discussed with the patient the benefits, risks, side-effects, alternatives, likelihood of achieving goals, and potential problems during recovery for the procedure that I have provided informed consent. Date  Time: 05/17/2022  9:05 AM   Pre-Procedure Preparation:  Monitoring: As per clinic protocol. Respiration, ETCO2, SpO2, BP, heart rate and rhythm monitor placed and checked for adequate function Safety Precautions: Patient was assessed for positional comfort and pressure points before starting the procedure. Time-out: I initiated  and conducted the "Time-out" before starting the procedure, as per protocol. The patient was asked to participate by confirming the accuracy of the "Time Out" information. Verification of the correct person, site, and procedure were performed and confirmed by me, the nursing staff, and the patient. "Time-out" conducted as per Joint Commission's Universal Protocol (UP.01.01.01). Time: 0955 Start Time: 0955 hrs.  Description of Procedure:          Laterality: See above. Levels:  See above. Safety Precautions: Aspiration looking for blood return was conducted prior to all injections. At no point did we inject any substances, as a needle was being advanced. Before injecting, the patient was told to immediately notify me if she was experiencing any new onset of "ringing in the ears, or metallic taste in the mouth". No attempts were made at seeking any paresthesias. Safe injection practices and needle disposal techniques used. Medications properly checked for expiration dates. SDV (single dose vial) medications used. After the completion of the procedure, all disposable equipment used was discarded in the proper designated medical waste containers. Local Anesthesia: Protocol guidelines were followed. The patient was positioned over the fluoroscopy table. The area was prepped in the usual manner. The time-out was completed. The target area was identified using fluoroscopy. A 12-in long, straight, sterile hemostat was used with fluoroscopic guidance to locate the targets for each level blocked. Once located, the skin was marked with an approved surgical skin marker. Once all sites were marked, the skin (epidermis, dermis, and hypodermis), as well as deeper tissues (fat, connective tissue and muscle) were infiltrated with a small amount of a short-acting local anesthetic, loaded on a 10cc syringe with a 25G, 1.5-in  Needle. An appropriate amount of time was allowed for local anesthetics to take effect before  proceeding to the next step. Technical description of process:  Radiofrequency Ablation (RFA)  L3 Medial Branch Nerve RFA: The target area for the L3 medial branch is at the junction of the postero-lateral aspect of the superior articular process and the superior, posterior, and medial edge of the transverse process of L4. Under fluoroscopic guidance, a Radiofrequency needle was inserted until contact was  made with os over the superior postero-lateral aspect of the pedicular shadow (target area). Sensory and motor testing was conducted to properly adjust the position of the needle. Once satisfactory placement of the needle was achieved, the numbing solution was slowly injected after negative aspiration for blood. 2.0 mL of the nerve block solution was injected without difficulty or complication. After waiting for at least 3 minutes, the ablation was performed. Once completed, the needle was removed intact. L4 Medial Branch Nerve RFA: The target area for the L4 medial branch is at the junction of the postero-lateral aspect of the superior articular process and the superior, posterior, and medial edge of the transverse process of L5. Under fluoroscopic guidance, a Radiofrequency needle was inserted until contact was made with os over the superior postero-lateral aspect of the pedicular shadow (target area). Sensory and motor testing was conducted to properly adjust the position of the needle. Once satisfactory placement of the needle was achieved, the numbing solution was slowly injected after negative aspiration for blood. 2.0 mL of the nerve block solution was injected without difficulty or complication. After waiting for at least 3 minutes, the ablation was performed. Once completed, the needle was removed intact. L5 Medial Branch Nerve RFA: The target area for the L5 medial branch is at the junction of the postero-lateral aspect of the superior articular process of S1 and the superior, posterior, and medial  edge of the sacral ala. Under fluoroscopic guidance, a Radiofrequency needle was inserted until contact was made with os over the superior postero-lateral aspect of the pedicular shadow (target area). Sensory and motor testing was conducted to properly adjust the position of the needle. Once satisfactory placement of the needle was achieved, the numbing solution was slowly injected after negative aspiration for blood. 2.0 mL of the nerve block solution was injected without difficulty or complication. After waiting for at least 3 minutes, the ablation was performed. Once completed, the needle was removed intact. S1 Medial Branch Nerve RFA: The target area for the S1 medial branch is located inferior to the junction of the S1 superior articular process and the L5 inferior articular process, posterior, inferior, and lateral to the 6 o'clock position of the L5-S1 facet joint, just superior to the S1 posterior foramen. Under fluoroscopic guidance, the Radiofrequency needle was advanced until contact was made with os over the Target area. Sensory and motor testing was conducted to properly adjust the position of the needle. Once satisfactory placement of the needle was achieved, the numbing solution was slowly injected after negative aspiration for blood. 2.0 mL of the nerve block solution was injected without difficulty or complication. After waiting for at least 3 minutes, the ablation was performed. Once completed, the needle was removed intact. Radiofrequency lesioning (ablation):  Radiofrequency Generator: Medtronic AccurianTM AG 1000 RF Generator Sensory Stimulation Parameters: 50 Hz was used to locate & identify the nerve, making sure that the needle was positioned such that there was no sensory stimulation below 0.3 V or above 0.7 V. Motor Stimulation Parameters: 2 Hz was used to evaluate the motor component. Care was taken not to lesion any nerves that demonstrated motor stimulation of the lower extremities  at an output of less than 2.5 times that of the sensory threshold, or a maximum of 2.0 V. Lesioning Technique Parameters: Standard Radiofrequency settings. (Not bipolar or pulsed.) Temperature Settings: 80 degrees C Lesioning time: 60 seconds Intra-operative Compliance: Compliant  Once the entire procedure was completed, the treated area was cleaned, making sure to  leave some of the prepping solution back to take advantage of its long term bactericidal properties.    Illustration of the posterior view of the lumbar spine and the posterior neural structures. Laminae of L2 through S1 are labeled. DPRL5, dorsal primary ramus of L5; DPRS1, dorsal primary ramus of S1; DPR3, dorsal primary ramus of L3; FJ, facet (zygapophyseal) joint L3-L4; I, inferior articular process of L4; LB1, lateral branch of dorsal primary ramus of L1; IAB, inferior articular branches from L3 medial branch (supplies L4-L5 facet joint); IBP, intermediate branch plexus; MB3, medial branch of dorsal primary ramus of L3; NR3, third lumbar nerve root; S, superior articular process of L5; SAB, superior articular branches from L4 (supplies L4-5 facet joint also); TP3, transverse process of L3.  Vitals:   05/17/22 1015 05/17/22 1026 05/17/22 1030 05/17/22 1036  BP: 116/65 (!) 119/59 116/63 114/65  Pulse:      Resp: 16 (!) 22 20 19   Temp:  (!) 97.4 F (36.3 C)    TempSrc:  Temporal    SpO2: 98% 97% 98% 96%  Weight:      Height:        Start Time: 0955 hrs. End Time: 1015 hrs.  Imaging Guidance (Spinal):          Type of Imaging Technique: Fluoroscopy Guidance (Spinal) Indication(s): Assistance in needle guidance and placement for procedures requiring needle placement in or near specific anatomical locations not easily accessible without such assistance. Exposure Time: Please see nurses notes. Contrast: None used. Fluoroscopic Guidance: I was personally present during the use of fluoroscopy. "Tunnel Vision Technique" used to  obtain the best possible view of the target area. Parallax error corrected before commencing the procedure. "Direction-depth-direction" technique used to introduce the needle under continuous pulsed fluoroscopy. Once target was reached, antero-posterior, oblique, and lateral fluoroscopic projection used confirm needle placement in all planes. Images permanently stored in EMR. Interpretation: No contrast injected. I personally interpreted the imaging intraoperatively. Adequate needle placement confirmed in multiple planes. Permanent images saved into the patient's record.  Antibiotic Prophylaxis:   Anti-infectives (From admission, onward)    Start     Dose/Rate Route Frequency Ordered Stop   05/17/22 0930  vancomycin (VANCOCIN) IVPB 1000 mg/200 mL premix       Note to Pharmacy: Pediatric Recommended Dose: 15 mg/kg   1,000 mg 200 mL/hr over 60 Minutes Intravenous  Once 05/17/22 0920 05/17/22 1026      Indication(s): None identified  Post-operative Assessment:  Post-procedure Vital Signs:  Pulse/HCG Rate: 8975 Temp: (!) 97.4 F (36.3 C) Resp: 19 BP: 114/65 SpO2: 96 %  EBL: None  Complications: No immediate post-treatment complications observed by team, or reported by patient.  Note: The patient tolerated the entire procedure well. A repeat set of vitals were taken after the procedure and the patient was kept under observation following institutional policy, for this type of procedure. Post-procedural neurological assessment was performed, showing return to baseline, prior to discharge. The patient was provided with post-procedure discharge instructions, including a section on how to identify potential problems. Should any problems arise concerning this procedure, the patient was given instructions to immediately contact us, at any time, without hesitation. In any case, we plan to contact the patient by telephone for a follow-up status report regarding this interventional  procedure.  Comments:  No additional relevant information.  Plan of Care (POC)  Orders:  Orders Placed This Encounter  Procedures   Radiofrequency,Lumbar    Scheduling Instructions:  Side(s): Left-sided     Level: L3-4, L4-5, and L5-S1 Facets (L2, L3, L4, L5, and S1 Medial Branch)     Sedation: With Sedation.     Timeframe: Today    Order Specific Question:   Where will this procedure be performed?    Answer:   [facility]   DG PAIN [facility] C-ARM 1-60 MIN NO REPORT    Intraoperative interpretation by procedural physician at [facility].    Standing Status:   Standing    Number of Occurrences:   1    Order Specific Question:   Reason for exam:    Answer:   Assistance in needle guidance and placement for procedures requiring needle placement in or near specific anatomical locations not easily accessible without such assistance.   Informed Consent Details: Physician/Practitioner Attestation; Transcribe to consent form and obtain patient signature    Nursing Order: Transcribe to consent form and obtain patient signature. Note: Always confirm laterality of pain with Ms. Rana Snare, before procedure.    Order Specific Question:   Physician/Practitioner attestation of informed consent for procedure/surgical case    Answer:   I, the physician/practitioner, attest that I have discussed with the patient the benefits, risks, side effects, alternatives, likelihood of achieving goals and potential problems during recovery for the procedure that I have provided informed consent.    Order Specific Question:   Procedure    Answer:   Lumbar Facet Radiofrequency Ablation    Order Specific Question:   Physician/Practitioner performing the procedure    Answer:   Hersey Maclellan A. Laban Emperor, MD    Order Specific Question:   Indication/Reason    Answer:   Low Back Pain, with our without leg pain, due to Facet Joint Arthralgia (Joint Pain) known as Lumbar Facet Syndrome, secondary to Lumbar, and/or  Lumbosacral Spondylosis (Arthritis of the Spine), without myelopathy or radiculopathy (Nerve Damage).   Provide equipment / supplies at bedside    Procedure tray: "Radiofrequency Tray" Additional material: Large hemostat (x1); Small hemostat (x1); Towels (x8); 4x4 sterile sponge pack (x1) Needle type: Teflon-coated Radiofrequency Needle (Disposable  single use) Size: Regular Quantity: 5    Standing Status:   Standing    Number of Occurrences:   1    Order Specific Question:   Specify    Answer:   Radiofrequency Tray   Nursing Instructions:    Please complete this patient's postprocedure evaluation.    Scheduling Instructions:     Please complete this patient's postprocedure evaluation.   Follow-up    Schedule Ms. Menge for a post-procedure follow-up evaluation encounter 6 weeks from now.    Standing Status:   Future    Standing Expiration Date:   06/28/2022    Scheduling Instructions:     Schedule follow-up visit on afternoon of procedure day (T, Th)     Type: Face-to-face (F2F) Post-procedure (PP) evaluation (E/M)     When: 6 weeks from now   Chronic Opioid Analgesic:  Oxycodone IR 5 mg tablet, 1 tab p.o. twice daily.  (Still tapering down on 02/14/2022). MME/day: Down to 15 mg/day from 487.5 mg/day   Medications ordered for procedure: Meds ordered this encounter  Medications   lidocaine (XYLOCAINE) 2 % (with pres) injection 400 mg   pentafluoroprop-tetrafluoroeth (GEBAUERS) aerosol   lactated ringers infusion   midazolam (VERSED) 5 MG/5ML injection 0.5-2 mg    Make sure Flumazenil is available in the pyxis when using this medication. If oversedation occurs, administer 0.2 mg IV over 15  sec. If after 45 sec no response, administer 0.2 mg again over 1 min; may repeat at 1 min intervals; not to exceed 4 doses (1 mg)   fentaNYL (SUBLIMAZE) injection 25-50 mcg    Make sure Narcan is available in the pyxis when using this medication. In the event of respiratory depression (RR< 8/min):  Titrate NARCAN (naloxone) in increments of 0.1 to 0.2 mg IV at 2-3 minute intervals, until desired degree of reversal.   ropivacaine (PF) 2 mg/mL (0.2%) (NAROPIN) injection 9 mL   triamcinolone acetonide (KENALOG-40) injection 40 mg   HYDROcodone-acetaminophen (NORCO/VICODIN) 5-325 MG tablet    Sig: Take 1 tablet by mouth every 8 (eight) hours as needed for up to 7 days for severe pain. Must last 7 days.    Dispense:  21 tablet    Refill:  0    For acute post-operative pain. Not to be refilled. Must last 7 days.   HYDROcodone-acetaminophen (NORCO/VICODIN) 5-325 MG tablet    Sig: Take 1 tablet by mouth every 8 (eight) hours as needed for up to 7 days for severe pain. Must last for 7 days.    Dispense:  21 tablet    Refill:  0    For acute post-operative pain. Not to be refilled. Must last 7 days.   vancomycin (VANCOCIN) IVPB 1000 mg/200 mL premix    Pediatric Recommended Dose: 15 mg/kg    Order Specific Question:   Indication:    Answer:   Other Indication (list below)    Order Specific Question:   Other Indication:    Answer:   Procedure Prophylaxis   Medications administered: We administered lidocaine, pentafluoroprop-tetrafluoroeth, lactated ringers, midazolam, triamcinolone acetonide, and vancomycin.  See the medical record for exact dosing, route, and time of administration.  Follow-up plan:   Return in about 6 weeks (around 06/28/2022) for Proc-day (T,Th), (Face2F), (PPE).       Interventional Therapies  Risk Factors  Considerations:      Planned  Pending:   Therapeutic left lumbar facet RFA #1    Under consideration:   Therapeutic bilateral lumbar facet RFA #1    Completed:   Therapeutic right lumbar facet RFA x1 (04/26/2022)  Diagnostic bilateral lumbar facet (L4-5, L5-S1) MBB x2 (03/20/2022) (100/100/100/75)    Completed by other providers:   right ultrasound-guided pes anserine bursa injection (01/13/2021) by Dorthula Nettles, DO  Right total knee replacement  (03/19/2016) by Neva Seat, MD  Right knee arthroscopy (05/22/2013) by Neva Seat, MD  Midline L4-5 LESI (10/18/2014) by Dr. Ewing Schlein    Therapeutic  Palliative (PRN) options:   None established   Pharmacotherapy  2D6 Metabolic Activity: Intermediate 2C19 Metabolic Activity: Normal 3A4 Metabolic Activity: Normal 3A5 Metabolic Activity: Poor patient of Dr. Ewing Schlein who had her on opioid analgesics totaling 487.5 MME/day by 10/12/2021.  Slow taper started 02/14/2022. Patient is currently completely off of all opioids.           Recent Visits Date Type Provider Dept  04/26/22 Procedure visit Delano Metz, MD [facility]  04/05/22 Office Visit Delano Metz, MD [facility]  03/20/22 Procedure visit Delano Metz, MD [facility]  03/13/22 Office Visit Delano Metz, MD [facility]  02/22/22 Procedure visit Delano Metz, MD [facility]  Showing recent visits within past 90 days and meeting all other requirements Today's Visits Date Type Provider Dept  05/17/22 Procedure visit Delano Metz, MD [facility]  Showing today's visits and meeting  all other requirements Future Appointments Date Type Provider Dept  06/12/22 Appointment Delano Metz, MD [facility]  Showing future appointments within next 90 days and meeting all other requirements  Disposition: Discharge home  Discharge (Date  Time): 05/17/2022;   hrs.   Primary Care Physician: Dale Skillman, MD Location: [facility] Note by: Oswaldo Done, MD (TTS technology used. I apologize for any typographical errors that were not detected and corrected.) Date: 05/17/2022; Time: 10:42 AM  Disclaimer:  Medicine is not an Visual merchandiser. The only guarantee in medicine is that nothing is guaranteed. It is important to note that the decision to proceed with this intervention was  based on the information collected from the patient. The Data and conclusions were drawn from the patient's questionnaire, the interview, and the physical examination. Because the information was provided in large part by the patient, it cannot be guaranteed that it has not been purposely or unconsciously manipulated. Every effort has been made to obtain as much relevant data as possible for this evaluation. It is important to note that the conclusions that lead to this procedure are derived in large part from the available data. Always take into account that the treatment will also be dependent on availability of resources and existing treatment guidelines, considered by other Pain Management Practitioners as being common knowledge and practice, at the time of the intervention. For Medico-Legal purposes, it is also important to point out that variation in procedural techniques and pharmacological choices are the acceptable norm. The indications, contraindications, technique, and results of the above procedure should only be interpreted and judged by a Board-Certified Interventional Pain Specialist with extensive familiarity and expertise in the same exact procedure and technique.

## 2022-05-17 ENCOUNTER — Encounter: Payer: Self-pay | Admitting: Pain Medicine

## 2022-05-17 ENCOUNTER — Ambulatory Visit
Admission: RE | Admit: 2022-05-17 | Discharge: 2022-05-17 | Disposition: A | Discharge status: Home / Self Care | Payer: Medicare Other | Source: Ambulatory Visit | Attending: Pain Medicine | Admitting: Pain Medicine

## 2022-05-17 ENCOUNTER — Ambulatory Visit: Payer: Medicare Other | Attending: Pain Medicine | Admitting: Pain Medicine

## 2022-05-17 VITALS — BP 111/68 | HR 89 | Temp 97.3°F | Resp 14 | Ht 67.0 in | Wt 168.0 lb

## 2022-05-17 DIAGNOSIS — G8929 Other chronic pain: Secondary | ICD-10-CM | POA: Insufficient documentation

## 2022-05-17 DIAGNOSIS — Z96651 Presence of right artificial knee joint: Secondary | ICD-10-CM | POA: Diagnosis not present

## 2022-05-17 DIAGNOSIS — Z792 Long term (current) use of antibiotics: Secondary | ICD-10-CM | POA: Insufficient documentation

## 2022-05-17 DIAGNOSIS — M47817 Spondylosis without myelopathy or radiculopathy, lumbosacral region: Secondary | ICD-10-CM

## 2022-05-17 DIAGNOSIS — Z09 Encounter for follow-up examination after completed treatment for conditions other than malignant neoplasm: Secondary | ICD-10-CM | POA: Insufficient documentation

## 2022-05-17 DIAGNOSIS — M4317 Spondylolisthesis, lumbosacral region: Secondary | ICD-10-CM | POA: Insufficient documentation

## 2022-05-17 DIAGNOSIS — M47816 Spondylosis without myelopathy or radiculopathy, lumbar region: Secondary | ICD-10-CM | POA: Diagnosis not present

## 2022-05-17 DIAGNOSIS — M5459 Other low back pain: Secondary | ICD-10-CM | POA: Insufficient documentation

## 2022-05-17 DIAGNOSIS — M545 Low back pain, unspecified: Secondary | ICD-10-CM | POA: Diagnosis not present

## 2022-05-17 DIAGNOSIS — Z5189 Encounter for other specified aftercare: Secondary | ICD-10-CM | POA: Insufficient documentation

## 2022-05-17 DIAGNOSIS — M5137 Other intervertebral disc degeneration, lumbosacral region: Secondary | ICD-10-CM | POA: Diagnosis not present

## 2022-05-17 DIAGNOSIS — G8918 Other acute postprocedural pain: Secondary | ICD-10-CM | POA: Insufficient documentation

## 2022-05-17 MED ORDER — LIDOCAINE HCL 2 % IJ SOLN
20.0000 mL | Freq: Once | INTRAMUSCULAR | Status: AC
  Administered 2022-05-17: 400 mg

## 2022-05-17 MED ORDER — TRIAMCINOLONE ACETONIDE 40 MG/ML IJ SUSP
INTRAMUSCULAR | Status: AC
  Filled 2022-05-17: qty 1

## 2022-05-17 MED ORDER — ROPIVACAINE HCL 2 MG/ML IJ SOLN
INTRAMUSCULAR | Status: AC
  Filled 2022-05-17: qty 20

## 2022-05-17 MED ORDER — LACTATED RINGERS IV SOLN: Freq: Once | INTRAVENOUS | Status: AC

## 2022-05-17 MED ORDER — MIDAZOLAM HCL 5 MG/5ML IJ SOLN
0.5000 mg | Freq: Once | INTRAMUSCULAR | Status: AC
  Administered 2022-05-17: 3 mg via INTRAVENOUS

## 2022-05-17 MED ORDER — ROPIVACAINE HCL 2 MG/ML IJ SOLN: 9.0000 mL | Freq: Once | INTRAMUSCULAR | Status: DC

## 2022-05-17 MED ORDER — LIDOCAINE HCL 2 % IJ SOLN
INTRAMUSCULAR | Status: AC
  Filled 2022-05-17: qty 20

## 2022-05-17 MED ORDER — HYDROCODONE-ACETAMINOPHEN 5-325 MG PO TABS
1.0000 | ORAL_TABLET | Freq: Three times a day (TID) | ORAL | 0 refills | Status: AC | PRN
Start: 2022-05-17 — End: 2022-05-24

## 2022-05-17 MED ORDER — TRIAMCINOLONE ACETONIDE 40 MG/ML IJ SUSP
40.0000 mg | Freq: Once | INTRAMUSCULAR | Status: AC
  Administered 2022-05-17: 40 mg

## 2022-05-17 MED ORDER — FENTANYL CITRATE (PF) 100 MCG/2ML IJ SOLN: 25.0000 ug | INTRAMUSCULAR | Status: DC | PRN

## 2022-05-17 MED ORDER — VANCOMYCIN HCL IN DEXTROSE 1-5 GM/200ML-% IV SOLN
1000.0000 mg | Freq: Once | INTRAVENOUS | Status: AC
  Administered 2022-05-17: 1000 mg via INTRAVENOUS
  Filled 2022-05-17: qty 200

## 2022-05-17 MED ORDER — FENTANYL CITRATE (PF) 100 MCG/2ML IJ SOLN
INTRAMUSCULAR | Status: AC
  Filled 2022-05-17: qty 2

## 2022-05-17 MED ORDER — MIDAZOLAM HCL 5 MG/5ML IJ SOLN
INTRAMUSCULAR | Status: AC
  Filled 2022-05-17: qty 5

## 2022-05-17 MED ORDER — PENTAFLUOROPROP-TETRAFLUOROETH EX AERO
INHALATION_SPRAY | Freq: Once | CUTANEOUS | Status: AC
  Administered 2022-05-17: 30 via TOPICAL

## 2022-05-17 MED ORDER — HYDROCODONE-ACETAMINOPHEN 5-325 MG PO TABS
1.0000 | ORAL_TABLET | Freq: Three times a day (TID) | ORAL | 0 refills | Status: AC | PRN
Start: 2022-05-24 — End: 2022-05-31

## 2022-05-17 NOTE — Patient Instructions (Signed)

## 2022-05-18 ENCOUNTER — Telehealth: Payer: Self-pay

## 2022-05-18 NOTE — Telephone Encounter (Signed)
Called PP. Denies any needs at this time. Instructed to call if needed. 

## 2022-05-22 DIAGNOSIS — M25561 Pain in right knee: Secondary | ICD-10-CM | POA: Diagnosis not present

## 2022-05-22 DIAGNOSIS — R531 Weakness: Secondary | ICD-10-CM | POA: Diagnosis not present

## 2022-05-29 DIAGNOSIS — R531 Weakness: Secondary | ICD-10-CM | POA: Diagnosis not present

## 2022-05-29 DIAGNOSIS — M25561 Pain in right knee: Secondary | ICD-10-CM | POA: Diagnosis not present

## 2022-05-31 DIAGNOSIS — R531 Weakness: Secondary | ICD-10-CM | POA: Diagnosis not present

## 2022-05-31 DIAGNOSIS — M25561 Pain in right knee: Secondary | ICD-10-CM | POA: Diagnosis not present

## 2022-06-04 ENCOUNTER — Telehealth: Payer: Self-pay

## 2022-06-04 ENCOUNTER — Ambulatory Visit
Admission: RE | Admit: 2022-06-04 | Discharge: 2022-06-04 | Disposition: A | Discharge status: Home / Self Care | Payer: Medicare Other | Source: Ambulatory Visit | Attending: Internal Medicine | Admitting: Internal Medicine

## 2022-06-04 DIAGNOSIS — Z1231 Encounter for screening mammogram for malignant neoplasm of breast: Secondary | ICD-10-CM

## 2022-06-04 NOTE — Telephone Encounter (Signed)
Patient cancelled her 5/6 appt because she was told if feeling better- ok to push out. Do you want me to schedule her to see you in June after her GI visit?

## 2022-06-04 NOTE — Telephone Encounter (Signed)
Patient states Rita Ohara, LPN, told her if she was feeling better, then they should be able to move her appointment out which is scheduled on 06/11/2022.  Patient states she is feeling better.  Patient states she does have an appointment with her GI doctor on 07/11/2022, so she wasn't sure if Dr. Dale Sawyer would like to see her after this visit.  I did cancel patient's appointment on 06/11/2022.  Please let us know when to reschedule.

## 2022-06-04 NOTE — Telephone Encounter (Signed)
Yes.  Thank you.

## 2022-06-05 ENCOUNTER — Other Ambulatory Visit: Payer: Self-pay | Admitting: Internal Medicine

## 2022-06-05 DIAGNOSIS — M25561 Pain in right knee: Secondary | ICD-10-CM | POA: Diagnosis not present

## 2022-06-05 DIAGNOSIS — N63 Unspecified lump in unspecified breast: Secondary | ICD-10-CM

## 2022-06-05 DIAGNOSIS — R531 Weakness: Secondary | ICD-10-CM | POA: Diagnosis not present

## 2022-06-05 DIAGNOSIS — R921 Mammographic calcification found on diagnostic imaging of breast: Secondary | ICD-10-CM

## 2022-06-05 DIAGNOSIS — R928 Other abnormal and inconclusive findings on diagnostic imaging of breast: Secondary | ICD-10-CM

## 2022-06-05 NOTE — Telephone Encounter (Signed)
Scheduled for follow up and fasting lab appt in July.

## 2022-06-06 ENCOUNTER — Telehealth: Payer: Self-pay

## 2022-06-06 NOTE — Telephone Encounter (Addendum)
Called patient. Unable to leave message. Per chart- patient scheduled for additional mammo and ultrasounds on 5/6.

## 2022-06-06 NOTE — Telephone Encounter (Signed)
-----   Message from Dale , MD sent at 06/06/2022  4:52 AM EDT ----- Please notify - I reviewed her mammogram report and radiology is recommending f/u views of both breasts and possible ultrasound.  The order has been placed for mammogram and possible ultrasound.  Someone should be contacting her with an appt date and time.  Let us know if not scheduled.

## 2022-06-07 DIAGNOSIS — M25561 Pain in right knee: Secondary | ICD-10-CM | POA: Diagnosis not present

## 2022-06-07 DIAGNOSIS — R531 Weakness: Secondary | ICD-10-CM | POA: Diagnosis not present

## 2022-06-11 ENCOUNTER — Ambulatory Visit
Admission: RE | Admit: 2022-06-11 | Discharge: 2022-06-11 | Disposition: A | Discharge status: Home / Self Care | Payer: Medicare Other | Source: Ambulatory Visit | Attending: Internal Medicine | Admitting: Internal Medicine

## 2022-06-11 ENCOUNTER — Ambulatory Visit: Payer: Medicare Other | Admitting: Internal Medicine

## 2022-06-11 DIAGNOSIS — N63 Unspecified lump in unspecified breast: Secondary | ICD-10-CM

## 2022-06-11 DIAGNOSIS — R921 Mammographic calcification found on diagnostic imaging of breast: Secondary | ICD-10-CM

## 2022-06-11 DIAGNOSIS — R928 Other abnormal and inconclusive findings on diagnostic imaging of breast: Secondary | ICD-10-CM

## 2022-06-11 DIAGNOSIS — N6312 Unspecified lump in the right breast, upper inner quadrant: Secondary | ICD-10-CM | POA: Diagnosis not present

## 2022-06-11 DIAGNOSIS — N6011 Diffuse cystic mastopathy of right breast: Secondary | ICD-10-CM | POA: Diagnosis not present

## 2022-06-11 DIAGNOSIS — N6012 Diffuse cystic mastopathy of left breast: Secondary | ICD-10-CM | POA: Diagnosis not present

## 2022-06-12 ENCOUNTER — Ambulatory Visit
Admission: RE | Admit: 2022-06-12 | Discharge: 2022-06-12 | Disposition: A | Discharge status: Home / Self Care | Payer: Self-pay | Source: Ambulatory Visit | Attending: Internal Medicine | Admitting: Internal Medicine

## 2022-06-12 ENCOUNTER — Encounter: Payer: Self-pay | Admitting: Pain Medicine

## 2022-06-12 ENCOUNTER — Telehealth: Payer: Self-pay

## 2022-06-12 ENCOUNTER — Ambulatory Visit: Payer: Medicare Other | Attending: Pain Medicine | Admitting: Pain Medicine

## 2022-06-12 ENCOUNTER — Other Ambulatory Visit: Payer: Self-pay | Admitting: *Deleted

## 2022-06-12 ENCOUNTER — Other Ambulatory Visit: Payer: Self-pay | Admitting: Internal Medicine

## 2022-06-12 VITALS — BP 134/59 | HR 99 | Temp 98.1°F | Resp 18 | Ht 67.0 in | Wt 168.0 lb

## 2022-06-12 DIAGNOSIS — M25561 Pain in right knee: Secondary | ICD-10-CM | POA: Diagnosis not present

## 2022-06-12 DIAGNOSIS — G8929 Other chronic pain: Secondary | ICD-10-CM | POA: Diagnosis not present

## 2022-06-12 DIAGNOSIS — M47816 Spondylosis without myelopathy or radiculopathy, lumbar region: Secondary | ICD-10-CM | POA: Insufficient documentation

## 2022-06-12 DIAGNOSIS — M5459 Other low back pain: Secondary | ICD-10-CM | POA: Diagnosis not present

## 2022-06-12 DIAGNOSIS — M545 Low back pain, unspecified: Secondary | ICD-10-CM | POA: Insufficient documentation

## 2022-06-12 DIAGNOSIS — Z1231 Encounter for screening mammogram for malignant neoplasm of breast: Secondary | ICD-10-CM

## 2022-06-12 DIAGNOSIS — R928 Other abnormal and inconclusive findings on diagnostic imaging of breast: Secondary | ICD-10-CM

## 2022-06-12 DIAGNOSIS — R531 Weakness: Secondary | ICD-10-CM | POA: Diagnosis not present

## 2022-06-12 DIAGNOSIS — Z09 Encounter for follow-up examination after completed treatment for conditions other than malignant neoplasm: Secondary | ICD-10-CM | POA: Insufficient documentation

## 2022-06-12 NOTE — Progress Notes (Signed)
Safety precautions to be maintained throughout the outpatient stay will include: orient to surroundings, keep bed in low position, maintain call bell within reach at all times, provide assistance with transfer out of bed and ambulation.  

## 2022-06-12 NOTE — Telephone Encounter (Signed)
LM with husband for patient to return call

## 2022-06-12 NOTE — Progress Notes (Signed)
PROVIDER NOTE: Information contained herein reflects review and annotations entered in association with encounter. Interpretation of such information and data should be left to medically-trained personnel. Information provided to patient can be located elsewhere in the medical record under "Patient Instructions". Document created using STT-dictation technology, any transcriptional errors that may result from process are unintentional.    Patient: Maria Keller  Service Category: E/M  Provider: Oswaldo Done, MD  DOB: Apr 10, 1950  DOS: 06/12/2022  Referring Provider: Dale Lakeside, MD  MRN: [id number]  Specialty: Interventional Pain Management  PCP: Dale Kongiganak, MD  Type: Established Patient  Setting: Ambulatory outpatient    Location: Office  Delivery: Face-to-face     HPI  [name], a 72 y.o. year old female, is here today because of her Chronic bilateral low back pain without sciatica [M54.50, G89.29]. Maria Keller primary complain today is Back Pain  Pertinent problems: Maria Keller has History of lymphoma; Fibromyalgia; Pain in joint involving multiple sites; Non-Hodgkin's lymphoma (HCC); Chronic low back pain (1ry area of Pain) (Bilateral) (R>L) w/o sciatica; Chronic sacroiliac joint pain (Bilateral); DDD (degenerative disc disease), lumbar; Lumbar facet syndrome; Sacroiliac joint dysfunction; Postherpetic neuralgia; Degenerative disc disease, lumbar; Lumbar radiculopathy; Headache; 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[facility]); Tingling sensation; Knee swelling; Loose total knee arthroplasty, sequela (Right); 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, right; History of shingles; History of urinary stone; Intractable migraine with aura without status migrainosus; Myalgia and myositis; Primary osteoarthritis of right knee; History of total knee replacement (Right); Lymphoma (HCC); Chronic pain syndrome; Chronic thigh pain (2ry area of Pain) (Bilateral) (R>L); Chronic knee pain after total  replacement (3ry area of Pain) (Right); Chronic shoulder pain (4th area of Pain) (Bilateral) (R>L); Status post revision of total knee replacement, right; Osteoarthritis of acromioclavicular joints (Bilateral); Tendinopathy of rotator cuff (Bilateral); Shoulder enthesopathy (Bilateral); Grade 1 Anterolisthesis of lumbosacral spine of L5/S1; DDD (degenerative disc disease), lumbosacral; Lumbosacral facet arthropathy; Osteoarthritis of sacroiliac joints (Bilateral) (HCC); Spondylosis without myelopathy or radiculopathy, lumbosacral region; S/P TKR (total knee replacement), right; Lumbar facet joint pain; and Osteoarthritis of facet joint of lumbar spine on their pertinent problem list. Pain Assessment: Severity of Chronic pain is reported as a 1 /10. Location: Back Lower, Right, Left/Denies however has seperate pain in sacrum that radaites from lower sacrum into hips bilateral into thighs bilateral. Onset: More than a month ago. Quality: Constant, Aching, Sharp. Timing: Constant. Modifying factor(s): Procedure, rest, ice, and heat. Vitals:  height is 5\' 7"  (1.702 m) and weight is 168 lb (76.2 kg). Her temporal temperature is 98.1 F (36.7 C). Her blood pressure is 134/59 (abnormal) and her pulse is 99. Her respiration is 18 and oxygen saturation is 99%.  BMI: Estimated body mass index is 26.31 kg/m as calculated from the following:   Height as of this encounter: 5\' 7"  (1.702 m).   Weight as of this encounter: 168 lb (76.2 kg). Last encounter: 04/05/2022. Last procedure: 05/17/2022.  Reason for encounter: post-procedure evaluation and assessment.  After having had the second side done, the patient indicates having an ongoing 90% relief of the pain in her lower back.  Post-procedure evaluation   Procedure: Lumbar Facet, Medial Branch Radiofrequency Ablation (RFA) #1  Laterality: Left (-LT)  Level: L3, L4, L5, and S1 Medial Branch Level(s). These levels will denervate the L4-5 and L5-S1 lumbar facet joints.   Imaging: Fluoroscopy-guided         Anesthesia: Local anesthesia (1-2% Lidocaine) Anxiolysis: IV Versed 3.0 mg Sedation:  Moderate Sedation None required. No Fentanyl administered.         DOS: 05/17/2022  Performed by: Oswaldo Done, MD  Purpose: Therapeutic/Palliative Indications: Low back pain severe enough to impact quality of life or function. Indications: 1. Lumbar facet syndrome   2. Lumbar facet joint pain   3. Osteoarthritis of facet joint of lumbar spine   4. Spondylosis without myelopathy or radiculopathy, lumbosacral region   5. Grade 1 Anterolisthesis of lumbosacral spine of L5/S1   6. Chronic low back pain (1ry area of Pain) (Bilateral) (R>L) w/o sciatica   7. DDD (degenerative disc disease), lumbosacral    Pain Score: Pre-procedure: 4 /10 Post-procedure: 0-No pain/10     Effectiveness:  Initial hour after procedure: 100 %. Subsequent 4-6 hours post-procedure: 100 %. Analgesia past initial 6 hours: 90 %. Ongoing improvement:  Analgesic: The patient indicates having an ongoing 90% improvement of her pain. Function: Maria Keller reports improvement in function ROM: Maria Keller reports improvement in ROM  Pharmacotherapy Assessment  Analgesic: No chronic opioid analgesics therapy prescribed by our practice.  MME/day: Down to 0 mg/day from 487.5 mg/day   Monitoring: [place] PMP: PDMP reviewed during this encounter.       Pharmacotherapy: No side-effects or adverse reactions reported. Compliance: No problems identified. Effectiveness: Clinically acceptable.  Earlyne Iba, RN  06/12/2022 12:42 PM  Sign when Signing Visit Safety precautions to be maintained throughout the outpatient stay will include: orient to surroundings, keep bed in low position, maintain call bell within reach at all times, provide assistance with transfer out of bed and ambulation.     No results found for: "CBDTHCR" No results found for: "D8THCCBX" No results found for: "D9THCCBX"  UDS:   Summary  Date Value Ref Range Status  02/14/2022 Note  Final    Comment:    ==================================================================== [employer] 13 (MW) ==================================================================== Test                             Result       Flag       Units  Drug Present and Declared for Prescription Verification   Oxycodone                      2285         EXPECTED   ng/mg creat   Oxymorphone                    77           EXPECTED   ng/mg creat   Noroxycodone                   >2967        EXPECTED   ng/mg creat   Noroxymorphone                 54           EXPECTED   ng/mg creat    Sources of oxycodone are scheduled prescription medications.    Oxymorphone, noroxycodone, and noroxymorphone are expected    metabolites of oxycodone. Oxymorphone is also available as a    scheduled prescription medication.  Drug Absent but Declared for Prescription Verification   Fentanyl                       Not Detected UNEXPECTED ng/mg creat ==================================================================== Test  Result    Flag   Units      Ref Range   Creatinine              337              mg/dL      >=16 ==================================================================== Declared Medications:  The flagging and interpretation on this report are based on the  following declared medications.  Unexpected results may arise from  inaccuracies in the declared medications.   **Note: The testing scope of this panel includes these medications:   Fentanyl (Duragesic)  Oxycodone (Roxicodone)   **Note: The testing scope of this panel does not include the  following reported medications:   Acetaminophen (Tylenol)  Aspirin  Atorvastatin (Lipitor)  Azelastine (Astelin)  Calcium  Cetirizine (Zyrtec)  Estradiol (Estrace)  Fluticasone (Flonase)  Furosemide (Lasix)  Multivitamin  Mupirocin (Bactroban)  Naloxone (Narcan)   Omeprazole (Prilosec)  Ondansetron (Zofran)  Prednisone (Deltasone)  Sennosides (Senokot)  Vitamin D3 ==================================================================== For clinical consultation, please call [phone]. ====================================================================       ROS  Constitutional: Denies any fever or chills Gastrointestinal: No reported hemesis, hematochezia, vomiting, or acute GI distress Musculoskeletal: Denies any acute onset joint swelling, redness, loss of ROM, or weakness Neurological: No reported episodes of acute onset apraxia, aphasia, dysarthria, agnosia, amnesia, paralysis, loss of coordination, or loss of consciousness  Medication Review  acetaminophen, aspirin EC, atorvastatin, calcium carbonate, cetirizine, cholecalciferol, estradiol, fluticasone, furosemide, ipratropium, multivitamin, mupirocin ointment, omeprazole, and senna-docusate  History Review  Allergy: Maria Keller is allergic to oxycontin [oxycodone hcl], penicillins, chloral hydrate, ciprofloxacin, colace [docusate calcium], methotrexate derivatives, parafon forte dsc [chlorzoxazone], penicillin g, tape, bextra [valdecoxib], oxycodone, and rofecoxib. Drug: Maria Keller  reports no history of drug use. Alcohol:  reports no history of alcohol use. Tobacco:  reports that she has never smoked. She has never used smokeless tobacco. Social: Maria Keller  reports that she has never smoked. She has never used smokeless tobacco. She reports that she does not drink alcohol and does not use drugs. Medical:  has a past medical history of Anemia, Arthritis, Bronchitis (08/2015), Chronic back pain, Depression, Diverticulosis, Esophageal stricture, Fibromyalgia, Fibromyalgia, Fibromyalgia, Follicular lymphoma (HCC), Follicular lymphoma (HCC), GERD (gastroesophageal reflux disease), GERD (gastroesophageal reflux disease), Hemorrhoid, Hiatal hernia, History of kidney stones, Hypercholesterolemia, IBS  (irritable bowel syndrome), IBS (irritable bowel syndrome), Nephrolithiasis, PONV (postoperative nausea and vomiting), Shingles outbreak (11/26/2011), and Sleep apnea. Surgical: Maria Keller  has a past surgical history that includes Tonsilectomy/adenoidectomy with myringotomy (1970); Appendectomy; Tubal ligation (1976); Vaginal hysterectomy (1980's); Left oophorectomy; Ovarian cyst removal; Bladder surgery (1993); Cholecystectomy (1995); ORIF ankle fracture (2012); Rectocele repair (N/A); Breast biopsy (N/A); Leg Surgery (Left); Tonsillectomy (N/A); Replacement total knee (Right, 03/19/2016); history of helicobacter pylori infection; capsule endoscopy; Joint replacement (Right); Fracture surgery (Left); Esophagogastroduodenoscopy (egd) with propofol (N/A, 06/19/2017); Colonoscopy with propofol (N/A, 06/19/2017); Eye surgery; and Total knee revision (Right, 11/07/2021). Family: family history includes Arthritis in her maternal grandmother; Breast cancer in an other family member; Diabetes in her maternal grandfather and maternal grandmother; Heart disease in her maternal grandmother; Hypertension in her maternal grandmother; Lung cancer in an other family member; Ulcers in her father and mother.  Laboratory Chemistry Profile   Renal Lab Results  Component Value Date   BUN 23 04/06/2022   CREATININE 0.77 04/06/2022   BCR 13 10/12/2021   GFR 76.56 04/06/2022   GFRAA >60 11/06/2012   GFRNONAA >60 11/08/2021    Hepatic Lab Results  Component  Value Date   AST 18 04/06/2022   ALT 27 04/06/2022   ALBUMIN 4.0 04/06/2022   ALKPHOS 67 04/06/2022   HCVAB NEGATIVE 05/04/2015    Electrolytes Lab Results  Component Value Date   NA 142 04/06/2022   K 3.8 04/06/2022   CL 102 04/06/2022   CALCIUM 10.0 04/06/2022   MG 2.4 (H) 10/12/2021    Bone Lab Results  Component Value Date   25OHVITD1 32 10/12/2021   25OHVITD2 <1.0 10/12/2021   25OHVITD3 32 10/12/2021    Inflammation (CRP: Acute Phase) (ESR:  Chronic Phase) Lab Results  Component Value Date   CRP 3 10/12/2021   ESRSEDRATE 24 10/12/2021         Note: Above Lab results reviewed.  Recent Imaging Review  MM Outside Films Mammo This examination belongs to an outside facility and is stored here for  comparison purposes only.  Contact the originating outside institution for  any associated report or interpretation. MM Outside Films Mammo This examination belongs to an outside facility and is stored here for  comparison purposes only.  Contact the originating outside institution for  any associated report or interpretation. MM Outside Films Mammo This examination belongs to an outside facility and is stored here for  comparison purposes only.  Contact the originating outside institution for  any associated report or interpretation. MM Outside Films Mammo This examination belongs to an outside facility and is stored here for  comparison purposes only.  Contact the originating outside institution for  any associated report or interpretation. MM Outside Films Mammo This examination belongs to an outside facility and is stored here for  comparison purposes only.  Contact the originating outside institution for  any associated report or interpretation. Note: Reviewed        Physical Exam  General appearance: Well nourished, well developed, and well hydrated. In no apparent acute distress Mental status: Alert, oriented x 3 (person, place, & time)       Respiratory: No evidence of acute respiratory distress Eyes: PERLA Vitals: BP (!) 134/59   Pulse 99   Temp 98.1 F (36.7 C) (Temporal)   Resp 18   Ht 5\' 7"  (1.702 m)   Wt 168 lb (76.2 kg)   SpO2 99%   BMI 26.31 kg/m  BMI: Estimated body mass index is 26.31 kg/m as calculated from the following:   Height as of this encounter: 5\' 7"  (1.702 m).   Weight as of this encounter: 168 lb (76.2 kg). Ideal: Ideal body weight: 61.6 kg (135 lb 12.9 oz) Adjusted ideal body weight:  67.4 kg (148 lb 10.9 oz)  Assessment   Diagnosis Status  1. Chronic low back pain (1ry area of Pain) (Bilateral) (R>L) w/o sciatica   2. Lumbar facet joint pain   3. Lumbar facet syndrome   4. Postop check    Resolved Resolved Resolved   Updated Problems: No problems updated.  Plan of Care  Problem-specific:  No problem-specific Assessment & Plan notes found for this encounter.  Maria Keller has a current medication list which includes the following long-term medication(s): atorvastatin, calcium carbonate, cetirizine, estradiol, fluticasone, furosemide, ipratropium, and omeprazole.  Pharmacotherapy (Medications Ordered): No orders of the defined types were placed in this encounter.  Orders:  Orders Placed This Encounter  Procedures   Nursing Instructions:    Please complete this patient's postprocedure evaluation.    Scheduling Instructions:     Please complete this patient's postprocedure evaluation.   Follow-up plan:  Return if symptoms worsen or fail to improve.      Interventional Therapies  Risk Factors  Considerations:      Planned  Pending:      Under consideration:      Completed:   Therapeutic left lumbar facet RFA x1 (05/17/2022) (100/100/90/90) (90% ongoing relief bilaterally)  Therapeutic right lumbar facet RFA x1 (04/26/2022) (100/80/50/50) (initial side treated)  Diagnostic bilateral lumbar facet (L4-5, L5-S1) MBB x2 (03/20/2022) (100/100/100/75)    Completed by other providers:   right ultrasound-guided pes anserine bursa injection (01/13/2021) by Dorthula Nettles, DO  Right total knee replacement (03/19/2016) by Neva Seat, MD  Right knee arthroscopy (05/22/2013) by Neva Seat, MD  Midline L4-5 LESI (10/18/2014) by Dr. Ewing Schlein    Therapeutic  Palliative (PRN) options:   None established   Pharmacotherapy  2D6 Metabolic Activity: Intermediate 2C19 Metabolic Activity: Normal 3A4 Metabolic Activity: Normal 3A5 Metabolic  Activity: Poor patient of Dr. Ewing Schlein who had her on opioid analgesics totaling 487.5 MME/day by 10/12/2021.  Slow taper started 02/14/2022. Patient is currently completely off of all opioids.        Recent Visits Date Type Provider Dept  05/17/22 Procedure visit Delano Metz, MD [facility]  04/26/22 Procedure visit Delano Metz, MD [facility]  04/05/22 Office Visit Delano Metz, MD [facility]  03/20/22 Procedure visit Delano Metz, MD [facility]  Showing recent visits within past 90 days and meeting all other requirements Today's Visits Date Type Provider Dept  06/12/22 Office Visit Delano Metz, MD [facility]  Showing today's visits and meeting all other requirements Future Appointments No visits were found meeting these conditions. Showing future appointments within next 90 days and meeting all other requirements  I discussed the assessment and treatment plan with the patient. The patient was provided an opportunity to ask questions and all were answered. The patient agreed with the plan and demonstrated an understanding of the instructions.  Patient advised to call back or seek an in-person evaluation if the symptoms or condition worsens.  Duration of encounter: 22 minutes.  Total time on encounter, as per AMA guidelines included both the face-to-face and non-face-to-face time personally spent by the physician and/or other qualified health care professional(s) on the day of the encounter (includes time in activities that require the physician or other qualified health care professional and does not include time in activities normally performed by clinical staff). Physician's time may include the following activities when performed: Preparing to see the patient (e.g., pre-charting review of records, searching for previously ordered imaging, lab work, and nerve conduction tests) Review of prior  analgesic pharmacotherapies. Reviewing PMP Interpreting ordered tests (e.g., lab work, imaging, nerve conduction tests) Performing post-procedure evaluations, including interpretation of diagnostic procedures Obtaining and/or reviewing separately obtained history Performing a medically appropriate examination and/or evaluation Counseling and educating the patient/family/caregiver Ordering medications, tests, or procedures Referring and communicating with other health care professionals (when not separately reported) Documenting clinical information in the electronic or other health record Independently interpreting results (not separately reported) and communicating results to the patient/ family/caregiver Care coordination (not separately reported)  Note by: Oswaldo Done, MD Date: 06/12/2022; Time: 1:01 PM

## 2022-06-12 NOTE — Telephone Encounter (Signed)
Pt called back and I read the message to her and she stated just let her know

## 2022-06-12 NOTE — Telephone Encounter (Signed)
-----   Message from Dale Brownfield, MD sent at 06/12/2022  4:35 AM EDT ----- Please call and notify Ms Urbaniak that I reviewed her mammogram report and radiology is recommending a f/u right diagnostic mammogram and ultrasound in 6 months.  Order has been placed.  Let us know if not scheduled.  Also, notify her that they are trying to get outside films for comparison.

## 2022-06-12 NOTE — Telephone Encounter (Signed)
Patient aware of below. Patient gave verbal understanding.

## 2022-06-14 DIAGNOSIS — M25561 Pain in right knee: Secondary | ICD-10-CM | POA: Diagnosis not present

## 2022-06-14 DIAGNOSIS — R531 Weakness: Secondary | ICD-10-CM | POA: Diagnosis not present

## 2022-06-19 DIAGNOSIS — R531 Weakness: Secondary | ICD-10-CM | POA: Diagnosis not present

## 2022-06-19 DIAGNOSIS — M25561 Pain in right knee: Secondary | ICD-10-CM | POA: Diagnosis not present

## 2022-06-20 IMAGING — CT CT KNEE*R* W/O CM
3 of 6 series · 11 of 33 positions shown, 13 images · non-contrast
Comparison: MRI right calf 01/06/2021, MRI right knee 11/30/2015

CLINICAL DATA: Right knee pain and swelling below the right knee
for 3 months. History of total right knee arthroplasty [DATE].
History of follicular lymphoma in 9440.

EXAM:
CT OF THE right KNEE WITHOUT CONTRAST
TECHNIQUE: Multidetector CT imaging of the right knee was performed according
to the standard protocol. Multiplanar CT image reconstructions were
also generated.

[Series 2: axial bone lfov lower extremity 1.50 ax · axial · 0.27mm/px · z∈[-2104,-1876]mm · 5 of 431 slices shown, 7 images]
[im 72/431  soft-tissue]
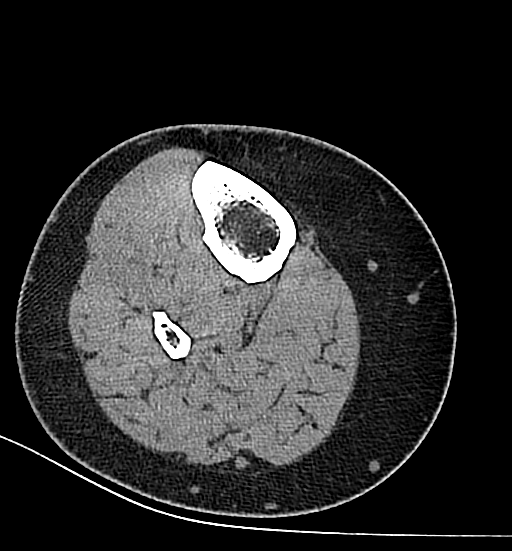
[im 72/431  bone]
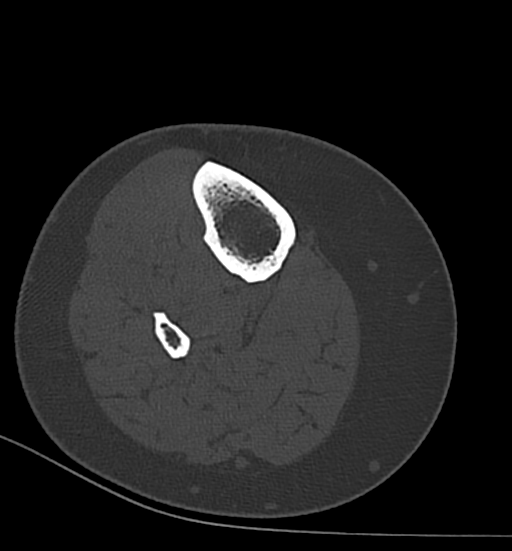
[im 144/431  bone]
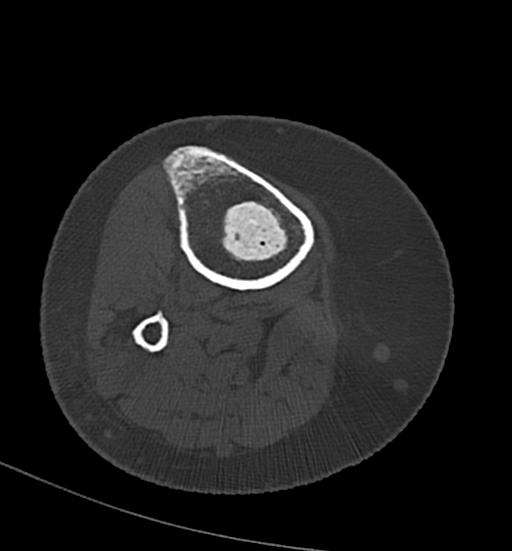
[im 216/431  bone]
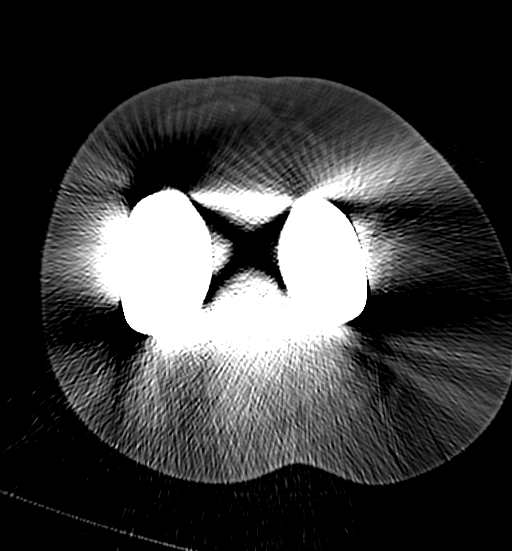
[im 287/431  bone]
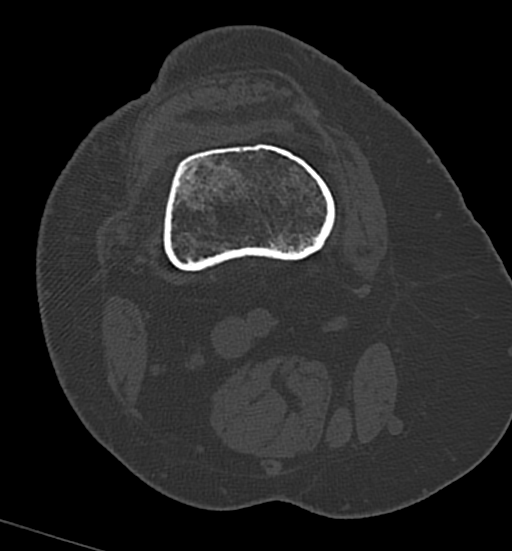
[im 359/431  soft-tissue]
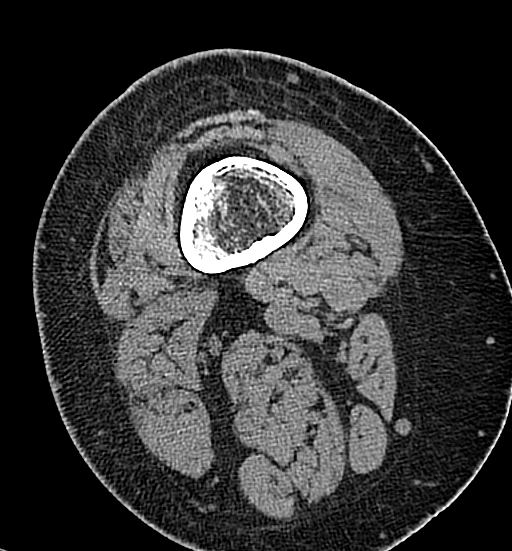
[im 359/431  bone]
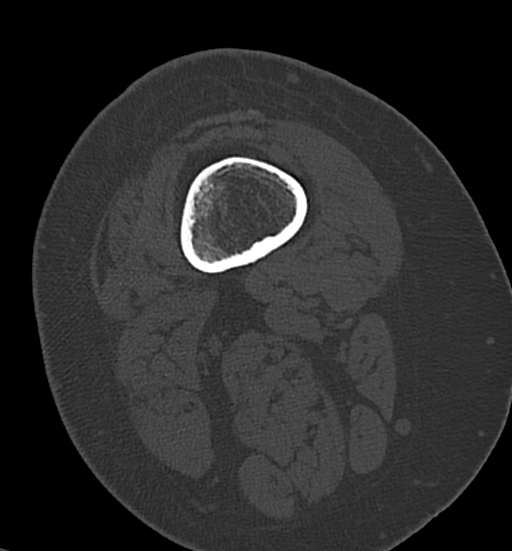

[Series 8: coronal bone lfov lower extremity 1.50 cor · coronal · 0.27mm/px · 1 of 214 slices shown]
[im 107/214  bone]
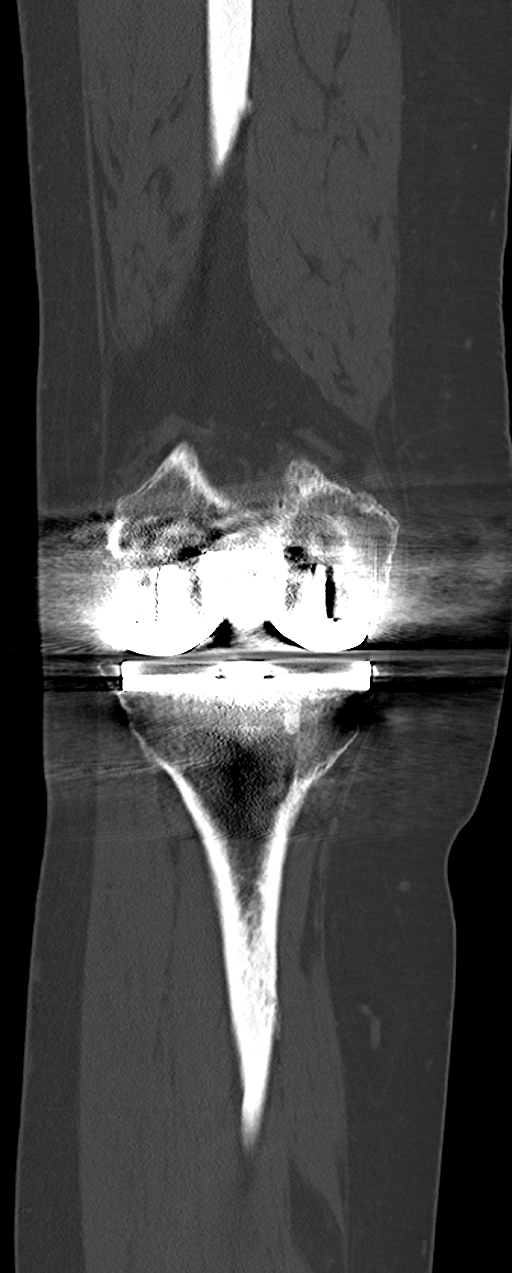

[Series 12: sagittal bone lfov lower extremity 1.50 sag · sagittal · 0.34mm/px · 5 of 173 slices shown]
[im 29/173  bone]
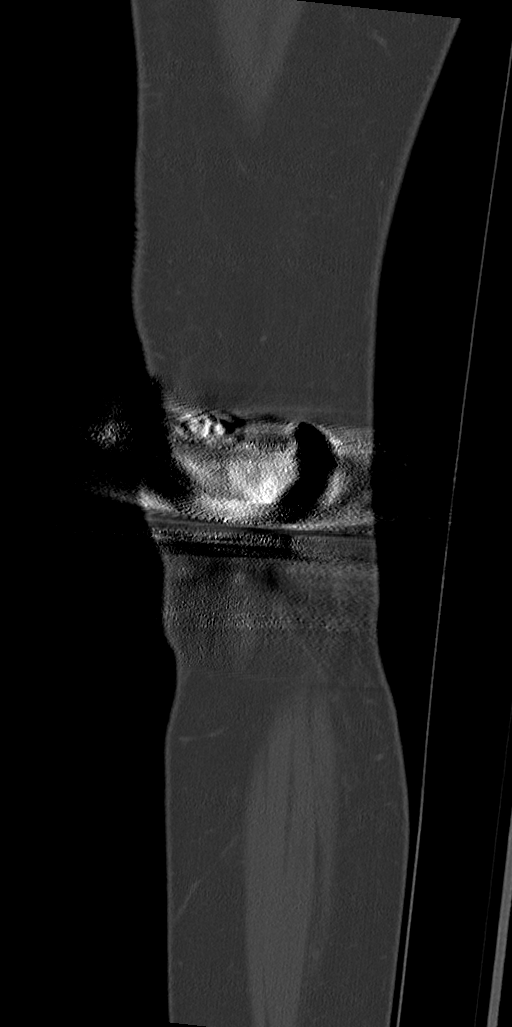
[im 58/173  bone]
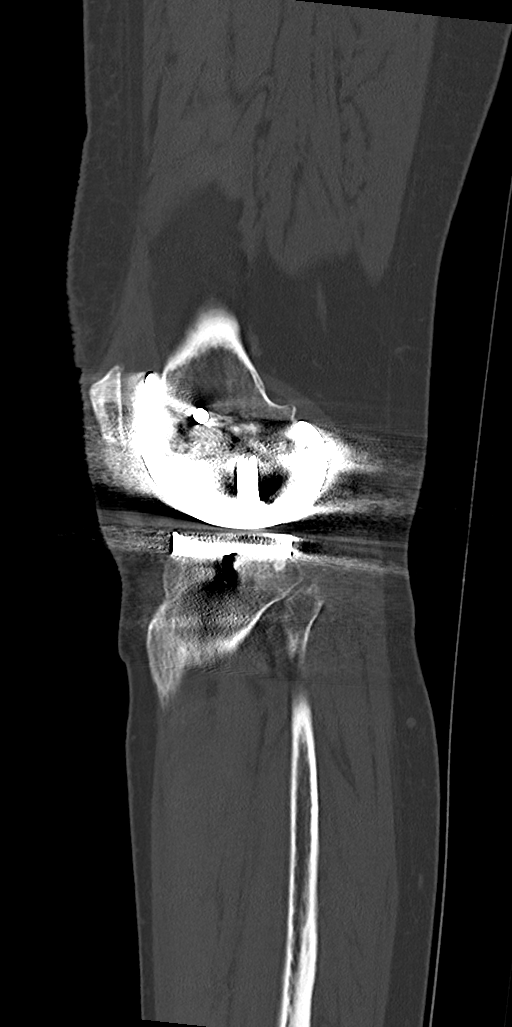
[im 87/173  bone]
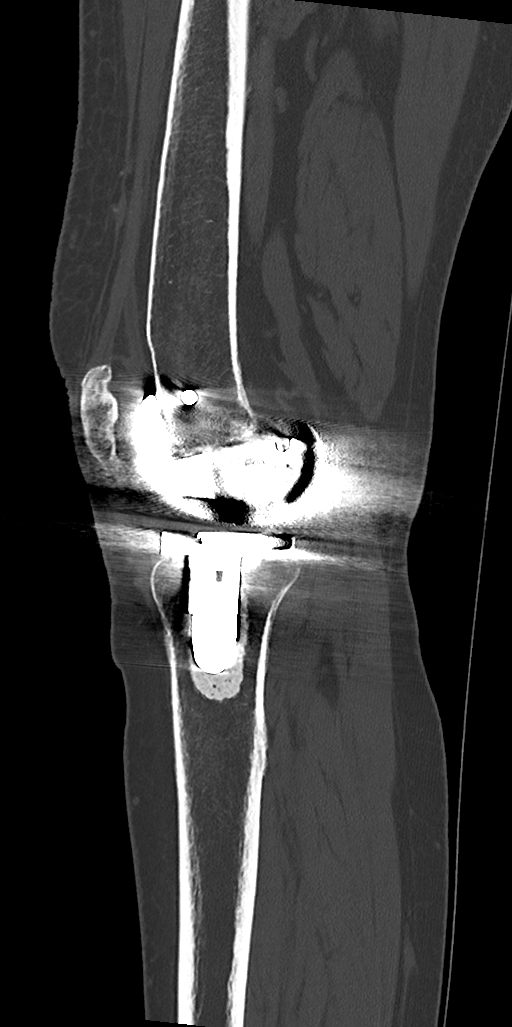
[im 115/173  bone]
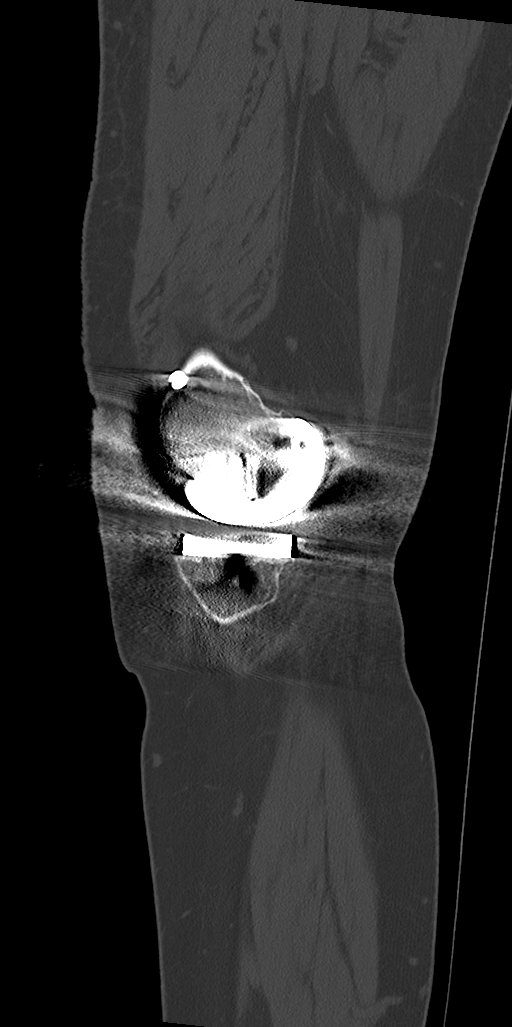
[im 144/173  bone]
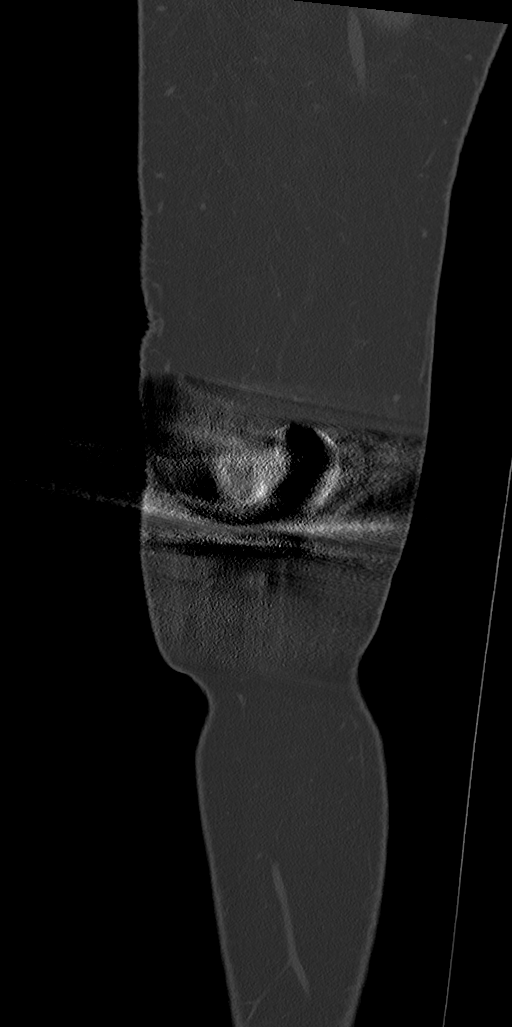

[11 of 33 positions shown; findings below may reference images not displayed]

FINDINGS: Bones/Joint/Cartilage

Postsurgical changes are again seen of total right knee
arthroplasty. Additional transverse screw within the distal femoral
metaphysis. This hardware produces streak artifacts that limits
evaluation of the distal femur greater than the proximal tibia.
Within this limitation, no definite perihardware lucency is seen to
indicate CT evidence of hardware loosening. Tiny joint effusion. No
acute fracture is seen. No dislocation.

Ligaments

Suboptimally assessed by CT.

Muscles and Tendons

There is again focal fat seen within the far medial aspect of the
mid to distal soleus musculature, as on 01/06/2021 MRI.

Soft tissues

Otherwise unremarkable.
IMPRESSION: Status post total right knee arthroplasty without evidence of
hardware failure.

## 2022-06-21 DIAGNOSIS — M25561 Pain in right knee: Secondary | ICD-10-CM | POA: Diagnosis not present

## 2022-06-21 DIAGNOSIS — R531 Weakness: Secondary | ICD-10-CM | POA: Diagnosis not present

## 2022-06-26 DIAGNOSIS — R531 Weakness: Secondary | ICD-10-CM | POA: Diagnosis not present

## 2022-06-26 DIAGNOSIS — M25561 Pain in right knee: Secondary | ICD-10-CM | POA: Diagnosis not present

## 2022-06-28 DIAGNOSIS — R531 Weakness: Secondary | ICD-10-CM | POA: Diagnosis not present

## 2022-06-28 DIAGNOSIS — M25561 Pain in right knee: Secondary | ICD-10-CM | POA: Diagnosis not present

## 2022-07-03 DIAGNOSIS — R531 Weakness: Secondary | ICD-10-CM | POA: Diagnosis not present

## 2022-07-03 DIAGNOSIS — M25561 Pain in right knee: Secondary | ICD-10-CM | POA: Diagnosis not present

## 2022-07-05 DIAGNOSIS — M25561 Pain in right knee: Secondary | ICD-10-CM | POA: Diagnosis not present

## 2022-07-05 DIAGNOSIS — R531 Weakness: Secondary | ICD-10-CM | POA: Diagnosis not present

## 2022-07-09 DIAGNOSIS — T84038S Mechanical loosening of other internal prosthetic joint, sequela: Secondary | ICD-10-CM | POA: Diagnosis not present

## 2022-07-09 DIAGNOSIS — Z96651 Presence of right artificial knee joint: Secondary | ICD-10-CM | POA: Diagnosis not present

## 2022-07-09 DIAGNOSIS — Z96659 Presence of unspecified artificial knee joint: Secondary | ICD-10-CM | POA: Diagnosis not present

## 2022-08-13 ENCOUNTER — Other Ambulatory Visit: Payer: Medicare Other

## 2022-08-15 ENCOUNTER — Ambulatory Visit: Payer: Medicare Other | Admitting: Internal Medicine

## 2022-08-17 ENCOUNTER — Other Ambulatory Visit: Payer: Medicare Other

## 2022-08-17 DIAGNOSIS — Z83719 Family history of colon polyps, unspecified: Secondary | ICD-10-CM | POA: Diagnosis not present

## 2022-08-17 DIAGNOSIS — K222 Esophageal obstruction: Secondary | ICD-10-CM | POA: Diagnosis not present

## 2022-08-17 DIAGNOSIS — R1310 Dysphagia, unspecified: Secondary | ICD-10-CM | POA: Diagnosis not present

## 2022-08-17 DIAGNOSIS — K581 Irritable bowel syndrome with constipation: Secondary | ICD-10-CM | POA: Diagnosis not present

## 2022-08-17 DIAGNOSIS — K219 Gastro-esophageal reflux disease without esophagitis: Secondary | ICD-10-CM | POA: Diagnosis not present

## 2022-08-27 ENCOUNTER — Ambulatory Visit (INDEPENDENT_AMBULATORY_CARE_PROVIDER_SITE_OTHER): Payer: Medicare Other | Admitting: *Deleted

## 2022-08-27 VITALS — Ht 67.0 in | Wt 173.0 lb

## 2022-08-27 DIAGNOSIS — Z0000 Encounter for general adult medical examination without abnormal findings: Secondary | ICD-10-CM

## 2022-08-27 NOTE — Progress Notes (Signed)
Subjective:   Maria Keller is a 72 y.o. female who presents for Medicare Annual (Subsequent) preventive examination.  Visit Complete: Virtual  I connected with  Maria Keller on 08/27/22 by a audio enabled telemedicine application and verified that I am speaking with the correct person using two identifiers.  Patient Location: Home  Provider Location: Office/Clinic  I discussed the limitations of evaluation and management by telemedicine. The patient expressed understanding and agreed to proceed.   Review of Systems     Cardiac Risk Factors include: advanced age (>50men, >36 women);dyslipidemia     Objective:    Today's Vitals   08/27/22 0854 08/27/22 0855  Weight: 173 lb (78.5 kg)   Height: 5\' 7"  (1.702 m)   PainSc:  4    Body mass index is 27.1 kg/m.     08/27/2022    9:09 AM 06/12/2022   12:51 PM 05/17/2022    9:10 AM 04/05/2022   10:47 AM 03/20/2022   11:23 AM 03/13/2022    1:22 PM 12/25/2021    8:36 AM  Advanced Directives  Does Patient Have a Medical Advance Directive? Yes Yes Yes Yes No Yes Yes  Type of Estate agent of [place];Living will Healthcare Power of [place];Living will Healthcare Power of [place] Living will  Healthcare Power of [place];Living will Healthcare Power of [place];Living will  Does patient want to make changes to medical advance directive? No - Patient declined        Copy of Healthcare Power of Attorney in Chart? Yes - validated most recent copy scanned in chart (See row information) No - copy requested    No - copy requested No - copy requested    Current Medications (verified) Outpatient Encounter Medications as of 08/27/2022  Medication Sig   acetaminophen (TYLENOL) 650 MG CR tablet Take 650 mg by mouth every 8 (eight) hours as needed for pain.   aspirin EC 81 MG tablet Take 81 mg by mouth daily.   atorvastatin (LIPITOR) 10 MG tablet Take 1 tablet (10 mg total) by mouth daily.   calcium carbonate (OS-CAL) 1250  (500 Ca) MG chewable tablet Chew 1 tablet by mouth daily.   cetirizine (ZYRTEC) 10 MG tablet Take by mouth.   fluticasone (FLONASE) 50 MCG/ACT nasal spray Place 2 sprays into both nostrils daily.   furosemide (LASIX) 20 MG tablet TAKE 1 TABLET BY MOUTH DAILY AS NEEDED.   Multiple Vitamin (MULTIVITAMIN) tablet Take 1 tablet by mouth daily.   mupirocin ointment (BACTROBAN) 2 % Apply 1 Application topically 2 (two) times daily.   omeprazole (PRILOSEC) 20 MG capsule TAKE 1 CAPSULE BY MOUTH TWO TIMES DAILY   senna-docusate (SENOKOT-S) 8.6-50 MG per tablet Take by mouth daily.   cholecalciferol (VITAMIN D3) 25 MCG (1000 UNIT) tablet Take 1,000 Units by mouth daily. (Patient not taking: Reported on 08/27/2022)   estradiol (ESTRACE) 0.5 MG tablet TAKE 1 TABLET BY MOUTH DAILY. (Patient not taking: Reported on 08/27/2022)   ipratropium (ATROVENT) 0.03 % nasal spray PLACE 2 SPRAYS INTO BOTH NOSTRILS EVERY 12 (TWELVE) HOURS. (Patient not taking: Reported on 08/27/2022)   No facility-administered encounter medications on file as of 08/27/2022.    Allergies (verified) Oxycontin [oxycodone hcl], Penicillins, Chloral hydrate, Ciprofloxacin, Colace [docusate calcium], Methotrexate derivatives, Parafon forte dsc [chlorzoxazone], Penicillin g, Tape, Bextra [valdecoxib], Oxycodone, and Rofecoxib   History: Past Medical History:  Diagnosis Date   Anemia    iron deficiency and B12 deficiency   Arthritis    Bronchitis  08/2015   Chronic back pain    followed by Dr Metta Clines   Depression    Diverticulosis    Esophageal stricture    requiring dilatation x 2   Fibromyalgia    Fibromyalgia    Fibromyalgia    Follicular lymphoma (HCC)    Followed by Dr James Ivanoff, s/p chemo and XRT   Follicular lymphoma (HCC)    GERD (gastroesophageal reflux disease)    GERD (gastroesophageal reflux disease)    Hemorrhoid    Hiatal hernia    History of kidney stones    Hypercholesterolemia    IBS (irritable bowel syndrome)     IBS (irritable bowel syndrome)    Nephrolithiasis    followed by Dr Lonna Cobb, s/p stents   PONV (postoperative nausea and vomiting)    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   Shingles outbreak 11/26/2011   Sleep apnea    Past Surgical History:  Procedure Laterality Date   APPENDECTOMY     BLADDER SURGERY  1993   BREAST BIOPSY N/A    capsule endoscopy     CHOLECYSTECTOMY  1995   COLONOSCOPY WITH PROPOFOL N/A 06/19/2017   Procedure: COLONOSCOPY WITH PROPOFOL;  Surgeon: Scot Jun, MD;  Location[facility];  Service: Endoscopy;  Laterality: N/A;   ESOPHAGOGASTRODUODENOSCOPY (EGD) WITH PROPOFOL N/A 06/19/2017   Procedure: ESOPHAGOGASTRODUODENOSCOPY (EGD) WITH PROPOFOL;  Surgeon: Scot Jun, MD;  Location[facility];  Service: Endoscopy;  Laterality: N/A;   EYE SURGERY     both cataract remove   FRACTURE SURGERY Left    ankle, plate with 8 screws   history of helicobacter pylori infection     JOINT REPLACEMENT Right    knee   LEFT OOPHORECTOMY     LEG SURGERY Left    ORIF ANKLE FRACTURE  2012   OVARIAN CYST REMOVAL     right   RECTOCELE REPAIR N/A    REPLACEMENT TOTAL KNEE Right 03/19/2016   TONSILECTOMY/ADENOIDECTOMY WITH MYRINGOTOMY  1970   TONSILLECTOMY N/A    TOTAL KNEE REVISION Right 11/07/2021   Procedure: TOTAL KNEE REVISION;  Surgeon: Christena Flake, MD;  Location: [facility] ORS;  Service: Orthopedics;  Laterality: Right;   TUBAL LIGATION  1976   VAGINAL HYSTERECTOMY  1980's   secondary to bleeding   Family History  Problem Relation Age of Onset   Ulcers Mother    Ulcers Father    Breast cancer Other        great aunt and grandfather   Lung cancer Other        uncle   Hypertension Maternal Grandmother    Heart disease Maternal Grandmother        MI 59s   Diabetes Maternal Grandmother    Arthritis Maternal Grandmother    Diabetes Maternal Grandfather    Social History   Socioeconomic History    Marital status: Married    Spouse name: [name]   Number of children: Not on file   Years of education: Not on file   Highest education level: Not on file  Occupational History   Not on file  Tobacco Use   Smoking status: Never   Smokeless tobacco: Never  Vaping Use   Vaping status: Never Used  Substance and Sexual Activity   Alcohol use: No    Alcohol/week: 0.0 standard drinks of alcohol   Drug use: No   Sexual activity: Yes  Other Topics Concern   Not on file  Social History Narrative   Not on file   Social Determinants of Health   Financial Resource Strain: Low Risk  (08/27/2022)   Overall Financial Resource Strain (CARDIA)    Difficulty of Paying Living Expenses: Not hard at all  Food Insecurity: No Food Insecurity (08/27/2022)   Hunger Vital Sign    Worried About Running Out of Food in the Last Year: Never true    Ran Out of Food in the Last Year: Never true  Transportation Needs: No Transportation Needs (08/27/2022)   PRAPARE - Administrator, Civil Service (Medical): No    Lack of Transportation (Non-Medical): No  Physical Activity: Inactive (08/27/2022)   Exercise Vital Sign    Days of Exercise per Week: 0 days    Minutes of Exercise per Session: 0 min  Stress: No Stress Concern Present (08/27/2022)   [employer] of Occupational Health - Occupational Stress Questionnaire    Feeling of Stress : Not at all  Social Connections: Moderately Integrated (08/27/2022)   Social Connection and Isolation Panel [NHANES]    Frequency of Communication with Friends and Family: More than three times a week    Frequency of Social Gatherings with Friends and Family: More than three times a week    Attends Religious Services: More than 4 times per year    Active Member of [employer] or Organizations: No    Attends Engineer, structural: Never    Marital Status: Married    Tobacco Counseling Counseling given: Not Answered   Clinical Intake:  Pre-visit  preparation completed: Yes  Pain : 0-10 Pain Score: 4  Pain Location: Back Pain Orientation: Lower Pain Onset: More than a month ago Pain Frequency: Constant Pain Relieving Factors: medication, heat and ice  Pain Relieving Factors: medication, heat and ice  BMI - recorded: 27.1 Nutritional Status: BMI 25 -29 Overweight Nutritional Risks: None Diabetes: No  How often do you need to have someone help you when you read instructions, pamphlets, or other written materials from your doctor or pharmacy?: 1 - Never  Interpreter Needed?: No  Information entered by :: R. Don Giarrusso LPN   Activities of Daily Living    08/27/2022    8:57 AM 08/24/2022    8:18 AM  In your present state of health, do you have any difficulty performing the following activities:  Hearing? 0 0  Vision? 0 0  Comment glasses   Difficulty concentrating or making decisions? 0 0  Walking or climbing stairs? 0 0  Dressing or bathing? 0 0  Doing errands, shopping? 0 0  Preparing Food and eating ? N N  Using the Toilet? N N  In the past six months, have you accidently leaked urine? Y N  Comment a little   Do you have problems with loss of bowel control? N N  Managing your Medications? N N  Managing your Finances? N N  Housekeeping or managing your Housekeeping? N N    Patient Care Team: Dale [place], MD as PCP - General (Internal Medicine)  Indicate any recent Medical Services you may have received from other than [facility] providers in the past year (date may be approximate).     Assessment:   This is a routine wellness examination for [place].  Hearing/Vision screen Hearing Screening - Comments:: No issues Vision Screening - Comments:: glasses  Dietary issues and exercise activities discussed:     Goals Addressed             This Visit's  Progress    Patient Stated       Try to start back exercising       Depression Screen    08/27/2022    9:02 AM 06/12/2022   12:51 PM 05/17/2022    9:09 AM  04/05/2022   10:47 AM 03/20/2022   11:23 AM 03/13/2022    1:22 PM 02/28/2022    4:21 PM  PHQ 2/9 Scores  PHQ - 2 Score 0 0 0 0 0 0 0  PHQ- 9 Score 1          Fall Risk    08/27/2022    9:06 AM 08/24/2022    8:18 AM 06/12/2022   12:50 PM 05/17/2022    9:09 AM 04/26/2022    8:54 AM  Fall Risk   Falls in the past year? 0 0 0 0 0  Number falls in past yr: 0  0    Injury with Fall? 0  0    Risk for fall due to : No Fall Risks  History of fall(s)    Follow up Falls prevention discussed;Falls evaluation completed        MEDICARE RISK AT HOME:  Medicare Risk at Home - 08/27/22 0906     Any stairs in or around the home? Yes    If so, are there any without handrails? No    Home free of loose throw rugs in walkways, pet beds, electrical cords, etc? Yes    Adequate lighting in your home to reduce risk of falls? Yes    Life alert? No    Use of a cane, walker or w/c? No    Grab bars in the bathroom? Yes    Shower chair or bench in shower? No    Elevated toilet seat or a handicapped toilet? Yes              Cognitive Function:    04/29/2017   10:47 AM 04/26/2016   10:53 AM 04/27/2015   11:24 AM  MMSE - Mini Mental State Exam  Orientation to time 5 5 5   Orientation to Place 5 5 5   Registration 3 3 3   Attention/ Calculation 5 5 5   Recall 3 3 3   Language- name 2 objects 2 2 2   Language- repeat 1 1 1   Language- follow 3 step command 3 3 3   Language- read & follow direction 1 1 1   Write a sentence 1 1 1   Copy design 1 1 1   Total score 30 30 30         08/27/2022    9:09 AM 08/20/2019    9:50 AM 08/19/2018   10:17 AM  6CIT Screen  What Year? 0 points  0 points  What month?   0 points  What time? 0 points  0 points  Count back from 20 0 points  0 points  Months in reverse 0 points 0 points 0 points  Repeat phrase 0 points  0 points  Total Score   0 points    Immunizations Immunization History  Administered Date(s) Administered   Fluad Quad(high Dose 65+) 10/28/2018,  11/12/2019, 11/08/2021   Influenza Split 02/25/2012   Influenza, High Dose Seasonal PF 11/17/2015, 01/07/2017, 02/01/2018   Influenza,inj,Quad PF,6+ Mos 02/22/2014, 10/27/2014   Influenza-Unspecified 02/29/2012, 10/28/2012, 02/22/2014, 10/27/2014, 11/17/2015   Moderna Sars-Covid-2 Vaccination 03/20/2019, 04/20/2019   Pneumococcal Conjugate-13 03/02/2015   Pneumococcal Polysaccharide-23 01/07/2017   Tdap 10/16/2018   Zoster Recombinant(Shingrix) 10/16/2017, 12/24/2017   Zoster, Live 11/06/2005  TDAP status: Up to date  Flu Vaccine status: Up to date  Pneumococcal vaccine status: Up to date  Covid-19 vaccine status: Information provided on how to obtain vaccines.   Qualifies for Shingles Vaccine? Yes   Zostavax completed Yes   Shingrix Completed?: Yes  Screening Tests Health Maintenance  Topic Date Due   COVID-19 Vaccine (3 - Moderna risk series) 05/18/2019   Colonoscopy  06/20/2022   Medicare Annual Wellness (AWV)  08/24/2022   INFLUENZA VACCINE  09/06/2022   MAMMOGRAM  06/10/2024   DTaP/Tdap/Td (2 - Td or Tdap) 10/15/2028   Pneumonia Vaccine 13+ Years old  Completed   DEXA SCAN  Completed   Hepatitis C Screening  Completed   Zoster Vaccines- Shingrix  Completed   HPV VACCINES  Aged Out    Health Maintenance  Health Maintenance Due  Topic Date Due   COVID-19 Vaccine (3 - Moderna risk series) 05/18/2019   Colonoscopy  06/20/2022   Medicare Annual Wellness (AWV)  08/24/2022    Colorectal cancer screening: Type of screening: Colonoscopy. Completed 5/19. Repeat every 5 years Scheduled for 11/05/22  Mammogram 5/24 Repeat in 6 months  Bone Density status: Completed 8/23. Results reflect: Bone density results: OSTEOPENIA. Repeat every 2 years.  Lung Cancer Screening: (Low Dose CT Chest recommended if Age 68-80 years, 20 pack-year currently smoking OR have quit w/in 15years.) does not qualify.    Additional Screening:  Hepatitis C Screening: does qualify;  Completed 3/17  Vision Screening: Recommended annual ophthalmology exams for early detection of glaucoma and other disorders of the eye. Is the patient up to date with their annual eye exam?  Yes  Who is the provider or what is the name of the office in which the patient attends annual eye exams? [facility]  If pt is not established with a provider, would they like to be referred to a provider to establish care? No .   Dental Screening: Recommended annual dental exams for proper oral hygiene    Community Resource Referral / Chronic Care Management: CRR required this visit?  No   CCM required this visit?  No     Plan:     I have personally reviewed and noted the following in the patient's chart:   Medical and social history Use of alcohol, tobacco or illicit drugs  Current medications and supplements including opioid prescriptions. Patient is not currently taking opioid prescriptions. Functional ability and status Nutritional status Physical activity Advanced directives List of other physicians Hospitalizations, surgeries, and ER visits in previous 12 months Vitals Screenings to include cognitive, depression, and falls Referrals and appointments  In addition, I have reviewed and discussed with patient certain preventive protocols, quality metrics, and best practice recommendations. A written personalized care plan for preventive services as well as general preventive health recommendations were provided to patient.     Sydell Axon, LPN   05/04/5186   After Visit Summary: (MyChart) Due to this being a telephonic visit, the after visit summary with patients personalized plan was offered to patient via MyChart   Nurse Notes: None

## 2022-08-27 NOTE — Patient Instructions (Signed)
Per patient no change in vitals since last visit, unable to obtain new vitals due to telehealth visit  [name] , Thank you for taking time to come for your Medicare Wellness Visit. I appreciate your ongoing commitment to your health goals. Please review the following plan we discussed and let me know if I can assist you in the future.   These are the goals we discussed:  Goals       Maintain Healthy Lifestyle (pt-stated)      Stay active. Healthy diet.       Patient Stated      Try to start back exercising        This is a list of the screening recommended for you and due dates:  Health Maintenance  Topic Date Due   COVID-19 Vaccine (3 - Moderna risk series) 05/18/2019   Colon Cancer Screening  06/20/2022   Medicare Annual Wellness Visit  08/24/2022   Flu Shot  09/06/2022   Mammogram  06/10/2024   DTaP/Tdap/Td vaccine (2 - Td or Tdap) 10/15/2028   Pneumonia Vaccine  Completed   DEXA scan (bone density measurement)  Completed   Hepatitis C Screening  Completed   Zoster (Shingles) Vaccine  Completed   HPV Vaccine  Aged Out    Advanced directives: On file  Conditions/risks identified: None  Next appointment: Follow up in one year for your annual wellness visit 08/28/23 @ 8:15   Preventive Care 65 Years and Older, Female Preventive care refers to lifestyle choices and visits with your health care provider that can promote health and wellness. What does preventive care include? A yearly physical exam. This is also called an annual well check. Dental exams once or twice a year. Routine eye exams. Ask your health care provider how often you should have your eyes checked. Personal lifestyle choices, including: Daily care of your teeth and gums. Regular physical activity. Eating a healthy diet. Avoiding tobacco and drug use. Limiting alcohol use. Practicing safe sex. Taking low-dose aspirin every day. Taking vitamin and mineral supplements as recommended by your health  care provider. What happens during an annual well check? The services and screenings done by your health care provider during your annual well check will depend on your age, overall health, lifestyle risk factors, and family history of disease. Counseling  Your health care provider may ask you questions about your: Alcohol use. Tobacco use. Drug use. Emotional well-being. Home and relationship well-being. Sexual activity. Eating habits. History of falls. Memory and ability to understand (cognition). Work and work Astronomer. Reproductive health. Screening  You may have the following tests or measurements: Height, weight, and BMI. Blood pressure. Lipid and cholesterol levels. These may be checked every 5 years, or more frequently if you are over 41 years old. Skin check. Lung cancer screening. You may have this screening every year starting at age 61 if you have a 30-pack-year history of smoking and currently smoke or have quit within the past 15 years. Fecal occult blood test (FOBT) of the stool. You may have this test every year starting at age 58. Flexible sigmoidoscopy or colonoscopy. You may have a sigmoidoscopy every 5 years or a colonoscopy every 10 years starting at age 109. Hepatitis C blood test. Hepatitis B blood test. Sexually transmitted disease (STD) testing. Diabetes screening. This is done by checking your blood sugar (glucose) after you have not eaten for a while (fasting). You may have this done every 1-3 years. Bone density scan. This is done  to screen for osteoporosis. You may have this done starting at age 73. Mammogram. This may be done every 1-2 years. Talk to your health care provider about how often you should have regular mammograms. Talk with your health care provider about your test results, treatment options, and if necessary, the need for more tests. Vaccines  Your health care provider may recommend certain vaccines, such as: Influenza vaccine. This is  recommended every year. Tetanus, diphtheria, and acellular pertussis (Tdap, Td) vaccine. You may need a Td booster every 10 years. Zoster vaccine. You may need this after age 50. Pneumococcal 13-valent conjugate (PCV13) vaccine. One dose is recommended after age 82. Pneumococcal polysaccharide (PPSV23) vaccine. One dose is recommended after age 34. Talk to your health care provider about which screenings and vaccines you need and how often you need them. This information is not intended to replace advice given to you by your health care provider. Make sure you discuss any questions you have with your health care provider. Document Released: 02/18/2015 Document Revised: 10/12/2015 Document Reviewed: 11/23/2014 Elsevier Interactive Patient Education  2017 [employer].  Fall Prevention in the Home Falls can cause injuries. They can happen to people of all ages. There are many things you can do to make your home safe and to help prevent falls. What can I do on the outside of my home? Regularly fix the edges of walkways and driveways and fix any cracks. Remove anything that might make you trip as you walk through a door, such as a raised step or threshold. Trim any bushes or trees on the path to your home. Use bright outdoor lighting. Clear any walking paths of anything that might make someone trip, such as rocks or tools. Regularly check to see if handrails are loose or broken. Make sure that both sides of any steps have handrails. Any raised decks and porches should have guardrails on the edges. Have any leaves, snow, or ice cleared regularly. Use sand or salt on walking paths during winter. Clean up any spills in your garage right away. This includes oil or grease spills. What can I do in the bathroom? Use night lights. Install grab bars by the toilet and in the tub and shower. Do not use towel bars as grab bars. Use non-skid mats or decals in the tub or shower. If you need to sit down in  the shower, use a plastic, non-slip stool. Keep the floor dry. Clean up any water that spills on the floor as soon as it happens. Remove soap buildup in the tub or shower regularly. Attach bath mats securely with double-sided non-slip rug tape. Do not have throw rugs and other things on the floor that can make you trip. What can I do in the bedroom? Use night lights. Make sure that you have a light by your bed that is easy to reach. Do not use any sheets or blankets that are too big for your bed. They should not hang down onto the floor. Have a firm chair that has side arms. You can use this for support while you get dressed. Do not have throw rugs and other things on the floor that can make you trip. What can I do in the kitchen? Clean up any spills right away. Avoid walking on wet floors. Keep items that you use a lot in easy-to-reach places. If you need to reach something above you, use a strong step stool that has a grab bar. Keep electrical cords out of the  way. Do not use floor polish or wax that makes floors slippery. If you must use wax, use non-skid floor wax. Do not have throw rugs and other things on the floor that can make you trip. What can I do with my stairs? Do not leave any items on the stairs. Make sure that there are handrails on both sides of the stairs and use them. Fix handrails that are broken or loose. Make sure that handrails are as long as the stairways. Check any carpeting to make sure that it is firmly attached to the stairs. Fix any carpet that is loose or worn. Avoid having throw rugs at the top or bottom of the stairs. If you do have throw rugs, attach them to the floor with carpet tape. Make sure that you have a light switch at the top of the stairs and the bottom of the stairs. If you do not have them, ask someone to add them for you. What else can I do to help prevent falls? Wear shoes that: Do not have high heels. Have rubber bottoms. Are comfortable  and fit you well. Are closed at the toe. Do not wear sandals. If you use a stepladder: Make sure that it is fully opened. Do not climb a closed stepladder. Make sure that both sides of the stepladder are locked into place. Ask someone to hold it for you, if possible. Clearly mark and make sure that you can see: Any grab bars or handrails. First and last steps. Where the edge of each step is. Use tools that help you move around (mobility aids) if they are needed. These include: Canes. Walkers. Scooters. Crutches. Turn on the lights when you go into a dark area. Replace any light bulbs as soon as they burn out. Set up your furniture so you have a clear path. Avoid moving your furniture around. If any of your floors are uneven, fix them. If there are any pets around you, be aware of where they are. Review your medicines with your doctor. Some medicines can make you feel dizzy. This can increase your chance of falling. Ask your doctor what other things that you can do to help prevent falls. This information is not intended to replace advice given to you by your health care provider. Make sure you discuss any questions you have with your health care provider. Document Released: 11/18/2008 Document Revised: 06/30/2015 Document Reviewed: 02/26/2014 Elsevier Interactive Patient Education  2017 [employer].

## 2022-08-28 ENCOUNTER — Other Ambulatory Visit (INDEPENDENT_AMBULATORY_CARE_PROVIDER_SITE_OTHER): Payer: Medicare Other

## 2022-08-28 ENCOUNTER — Other Ambulatory Visit: Payer: Self-pay | Admitting: Internal Medicine

## 2022-08-28 DIAGNOSIS — E7800 Pure hypercholesterolemia, unspecified: Secondary | ICD-10-CM

## 2022-08-28 DIAGNOSIS — L659 Nonscarring hair loss, unspecified: Secondary | ICD-10-CM | POA: Diagnosis not present

## 2022-08-28 DIAGNOSIS — R739 Hyperglycemia, unspecified: Secondary | ICD-10-CM | POA: Diagnosis not present

## 2022-08-28 LAB — BASIC METABOLIC PANEL
BUN: 14 mg/dL (ref 6–23)
CO2: 32 mEq/L (ref 19–32)
Calcium: 9.9 mg/dL (ref 8.4–10.5)
Chloride: 101 mEq/L (ref 96–112)
Creatinine, Ser: 0.77 mg/dL (ref 0.40–1.20)
GFR: 76.35 mL/min (ref >60.00)
Glucose, Bld: 101 mg/dL — ABNORMAL HIGH (ref 70–99)
Potassium: 4.4 mEq/L (ref 3.5–5.1)
Sodium: 141 mEq/L (ref 135–145)

## 2022-08-28 LAB — HEMOGLOBIN A1C: Hgb A1c MFr Bld: 5.7 % (ref 4.6–6.5)

## 2022-08-28 LAB — LIPID PANEL
Cholesterol: 187 mg/dL (ref 0–200)
HDL: 59.1 mg/dL (ref >39.00)
LDL Cholesterol: 107 mg/dL — ABNORMAL HIGH (ref 0–99)
NonHDL: 127.92
Total CHOL/HDL Ratio: 3
Triglycerides: 106 mg/dL (ref 0.0–149.0)
VLDL: 21.2 mg/dL (ref 0.0–40.0)

## 2022-08-28 LAB — TSH: TSH: 1.02 u[IU]/mL (ref 0.35–5.50)

## 2022-08-28 LAB — T4, FREE: Free T4: 2.2 ng/dL — ABNORMAL HIGH (ref 0.60–1.60)

## 2022-08-28 LAB — HEPATIC FUNCTION PANEL
ALT: 19 U/L (ref 0–35)
AST: 23 U/L (ref 0–37)
Albumin: 4.4 g/dL (ref 3.5–5.2)
Alkaline Phosphatase: 66 U/L (ref 39–117)
Bilirubin, Direct: 0.1 mg/dL (ref 0.0–0.3)
Total Bilirubin: 0.7 mg/dL (ref 0.2–1.2)
Total Protein: 6.7 g/dL (ref 6.0–8.3)

## 2022-08-28 NOTE — Progress Notes (Signed)
Orders added for thyroid labs.

## 2022-08-29 ENCOUNTER — Ambulatory Visit: Payer: Medicare Other | Admitting: Internal Medicine

## 2022-08-30 ENCOUNTER — Ambulatory Visit: Payer: Medicare Other | Admitting: Internal Medicine

## 2022-08-30 ENCOUNTER — Encounter: Payer: Self-pay | Admitting: Internal Medicine

## 2022-08-30 VITALS — BP 120/70 | HR 83 | Temp 97.9°F | Resp 16 | Ht 67.0 in | Wt 176.0 lb

## 2022-08-30 DIAGNOSIS — E7800 Pure hypercholesterolemia, unspecified: Secondary | ICD-10-CM

## 2022-08-30 DIAGNOSIS — K219 Gastro-esophageal reflux disease without esophagitis: Secondary | ICD-10-CM | POA: Diagnosis not present

## 2022-08-30 DIAGNOSIS — Z1211 Encounter for screening for malignant neoplasm of colon: Secondary | ICD-10-CM

## 2022-08-30 DIAGNOSIS — C8590 Non-Hodgkin lymphoma, unspecified, unspecified site: Secondary | ICD-10-CM

## 2022-08-30 DIAGNOSIS — M4696 Unspecified inflammatory spondylopathy, lumbar region: Secondary | ICD-10-CM | POA: Diagnosis not present

## 2022-08-30 DIAGNOSIS — D649 Anemia, unspecified: Secondary | ICD-10-CM | POA: Diagnosis not present

## 2022-08-30 DIAGNOSIS — R1310 Dysphagia, unspecified: Secondary | ICD-10-CM | POA: Diagnosis not present

## 2022-08-30 DIAGNOSIS — R739 Hyperglycemia, unspecified: Secondary | ICD-10-CM | POA: Diagnosis not present

## 2022-08-30 DIAGNOSIS — L659 Nonscarring hair loss, unspecified: Secondary | ICD-10-CM

## 2022-08-30 NOTE — Progress Notes (Signed)
Subjective:    Patient ID: Maria Keller, female    DOB: [birth year], 72 y.o.   MRN: [id number]  Patient here for  Chief Complaint  Patient presents with   Medical Management of Chronic Issues    HPI Here for f/u regarding hypercholesterolemia, GERD and cough. Has seen [facility] 01/03/22 - compression hose - for lymphedema. Was having issues with dysphagia.  Referred to GI. Evaluated 7/12 - [name] - continue omeprazole and pepcid - prn. Planning for colonoscopy and EGD. S/p knee surgery. Seeing ortho.  Completed PT. Doing exercise at home.  No chest pain or sob reported.  Still some swallowing issues with GI w/up planned as outlined.  No abdominal pain or bowel change reported.  Discussed recent labs.  Tolerating current does of statin.  Has had issues with statins previously.  Cholesterol has improved.  Discussed thyroid labs.  Was on biotin when labs drawn.  Recently stopped estrogen.  Discussed repeat.  Continued hair loss.  Discussed dermatology referral.     Past Medical History:  Diagnosis Date   Anemia    iron deficiency and B12 deficiency   Arthritis    Bronchitis 08/2015   Chronic back pain    followed by Dr Metta Clines   Depression    Diverticulosis    Esophageal stricture    requiring dilatation x 2   Fibromyalgia    Fibromyalgia    Fibromyalgia    Follicular lymphoma (HCC)    Followed by Dr James Ivanoff, s/p chemo and XRT   Follicular lymphoma (HCC)    GERD (gastroesophageal reflux disease)    GERD (gastroesophageal reflux disease)    Hemorrhoid    Hiatal hernia    History of kidney stones    Hypercholesterolemia    IBS (irritable bowel syndrome)    IBS (irritable bowel syndrome)    Nephrolithiasis    followed by Dr Lonna Cobb, s/p stents   PONV (postoperative nausea and vomiting)    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   Shingles outbreak 11/26/2011   Sleep apnea    Past Surgical History:  Procedure Laterality Date    APPENDECTOMY     BLADDER SURGERY  1993   BREAST BIOPSY N/A    capsule endoscopy     CHOLECYSTECTOMY  1995   COLONOSCOPY WITH PROPOFOL N/A 06/19/2017   Procedure: COLONOSCOPY WITH PROPOFOL;  Surgeon: Scot Jun, MD;  Location: [facility] ENDOSCOPY;  Service: Endoscopy;  Laterality: N/A;   ESOPHAGOGASTRODUODENOSCOPY (EGD) WITH PROPOFOL N/A 06/19/2017   Procedure: ESOPHAGOGASTRODUODENOSCOPY (EGD) WITH PROPOFOL;  Surgeon: Scot Jun, MD;  Location: [facility] ENDOSCOPY;  Service: Endoscopy;  Laterality: N/A;   EYE SURGERY     both cataract remove   FRACTURE SURGERY Left    ankle, plate with 8 screws   history of helicobacter pylori infection     JOINT REPLACEMENT Right    knee   LEFT OOPHORECTOMY     LEG SURGERY Left    ORIF ANKLE FRACTURE  2012   OVARIAN CYST REMOVAL     right   RECTOCELE REPAIR N/A    REPLACEMENT TOTAL KNEE Right 03/19/2016   TONSILECTOMY/ADENOIDECTOMY WITH MYRINGOTOMY  1970   TONSILLECTOMY N/A    TOTAL KNEE REVISION Right 11/07/2021   Procedure: TOTAL KNEE REVISION;  Surgeon: Christena Flake, MD;  Location: [facility] ORS;  Service: Orthopedics;  Laterality: Right;   TUBAL LIGATION  1976   VAGINAL HYSTERECTOMY  1980's   secondary to  bleeding   Family History  Problem Relation Age of Onset   Ulcers Mother    Ulcers Father    Breast cancer Other        great aunt and grandfather   Lung cancer Other        uncle   Hypertension Maternal Grandmother    Heart disease Maternal Grandmother        MI 2s   Diabetes Maternal Grandmother    Arthritis Maternal Grandmother    Diabetes Maternal Grandfather    Social History   Socioeconomic History   Marital status: Married    Spouse name: [name]   Number of children: Not on file   Years of education: Not on file   Highest education level: Associate degree: academic program  Occupational History   Not on file  Tobacco Use   Smoking status: Never   Smokeless tobacco: Never  Vaping Use   Vaping status: Never Used   Substance and Sexual Activity   Alcohol use: No    Alcohol/week: 0.0 standard drinks of alcohol   Drug use: No   Sexual activity: Yes  Other Topics Concern   Not on file  Social History Narrative   Not on file   Social Determinants of Health   Financial Resource Strain: Low Risk  (08/28/2022)   Overall Financial Resource Strain (CARDIA)    Difficulty of Paying Living Expenses: Not very hard  Food Insecurity: No Food Insecurity (08/28/2022)   Hunger Vital Sign    Worried About Running Out of Food in the Last Year: Never true    Ran Out of Food in the Last Year: Never true  Transportation Needs: No Transportation Needs (08/28/2022)   PRAPARE - Administrator, Civil Service (Medical): No    Lack of Transportation (Non-Medical): No  Physical Activity: Insufficiently Active (08/28/2022)   Exercise Vital Sign    Days of Exercise per Week: 2 days    Minutes of Exercise per Session: 10 min  Stress: No Stress Concern Present (08/28/2022)   [employer] of Occupational Health - Occupational Stress Questionnaire    Feeling of Stress : Not at all  Social Connections: Socially Integrated (08/28/2022)   Social Connection and Isolation Panel [NHANES]    Frequency of Communication with Friends and Family: More than three times a week    Frequency of Social Gatherings with Friends and Family: More than three times a week    Attends Religious Services: More than 4 times per year    Active Member of [employer] or Organizations: Yes    Attends Engineer, structural: More than 4 times per year    Marital Status: Married     Review of Systems  Constitutional:  Negative for appetite change and unexpected weight change.  HENT:  Negative for congestion and sinus pressure.   Respiratory:  Negative for cough, chest tightness and shortness of breath.   Cardiovascular:  Negative for chest pain, palpitations and leg swelling.  Gastrointestinal:  Negative for abdominal pain, diarrhea,  nausea and vomiting.  Genitourinary:  Negative for difficulty urinating and dysuria.  Musculoskeletal:  Negative for joint swelling.  Skin:  Negative for color change and rash.  Neurological:  Negative for dizziness and headaches.  Psychiatric/Behavioral:  Negative for agitation and dysphoric mood.        Objective:     BP 120/70   Pulse 83   Temp 97.9 F (36.6 C)   Resp 16   Ht 5\' 7"  (  1.702 m)   Wt 176 lb (79.8 kg)   SpO2 98%   BMI 27.57 kg/m  Wt Readings from Last 3 Encounters:  08/30/22 176 lb (79.8 kg)  08/27/22 173 lb (78.5 kg)  06/12/22 168 lb (76.2 kg)    Physical Exam Vitals reviewed.  Constitutional:      General: She is not in acute distress.    Appearance: Normal appearance.  HENT:     Head: Normocephalic and atraumatic.     Right Ear: External ear normal.     Left Ear: External ear normal.  Eyes:     General: No scleral icterus.       Right eye: No discharge.        Left eye: No discharge.     Conjunctiva/sclera: Conjunctivae normal.  Neck:     Thyroid: No thyromegaly.  Cardiovascular:     Rate and Rhythm: Normal rate and regular rhythm.  Pulmonary:     Effort: No respiratory distress.     Breath sounds: Normal breath sounds. No wheezing.  Abdominal:     General: Bowel sounds are normal.     Palpations: Abdomen is soft.     Tenderness: There is no abdominal tenderness.  Musculoskeletal:        General: No swelling or tenderness.     Cervical back: Neck supple. No tenderness.  Lymphadenopathy:     Cervical: No cervical adenopathy.  Skin:    Findings: No erythema or rash.  Neurological:     Mental Status: She is alert.  Psychiatric:        Mood and Affect: Mood normal.        Behavior: Behavior normal.      Outpatient Encounter Medications as of 08/30/2022  Medication Sig   Biotin 16109 MCG TABS Take 1 tablet by mouth daily.   vitamin E 180 MG (400 UNITS) capsule Take 400 Units by mouth daily.   acetaminophen (TYLENOL) 650 MG CR tablet  Take 650 mg by mouth every 8 (eight) hours as needed for pain.   aspirin EC 81 MG tablet Take 81 mg by mouth daily.   atorvastatin (LIPITOR) 10 MG tablet Take 1 tablet (10 mg total) by mouth daily.   calcium carbonate (OS-CAL) 1250 (500 Ca) MG chewable tablet Chew 1 tablet by mouth daily.   cetirizine (ZYRTEC) 10 MG tablet Take by mouth.   fluticasone (FLONASE) 50 MCG/ACT nasal spray Place 2 sprays into both nostrils daily.   furosemide (LASIX) 20 MG tablet TAKE 1 TABLET BY MOUTH DAILY AS NEEDED.   Multiple Vitamin (MULTIVITAMIN) tablet Take 1 tablet by mouth daily.   mupirocin ointment (BACTROBAN) 2 % Apply 1 Application topically 2 (two) times daily.   omeprazole (PRILOSEC) 20 MG capsule TAKE 1 CAPSULE BY MOUTH TWO TIMES DAILY   senna-docusate (SENOKOT-S) 8.6-50 MG per tablet Take by mouth daily.   [DISCONTINUED] cholecalciferol (VITAMIN D3) 25 MCG (1000 UNIT) tablet Take 1,000 Units by mouth daily. (Patient not taking: Reported on 08/27/2022)   [DISCONTINUED] estradiol (ESTRACE) 0.5 MG tablet TAKE 1 TABLET BY MOUTH DAILY. (Patient not taking: Reported on 08/27/2022)   [DISCONTINUED] ipratropium (ATROVENT) 0.03 % nasal spray PLACE 2 SPRAYS INTO BOTH NOSTRILS EVERY 12 (TWELVE) HOURS. (Patient not taking: Reported on 08/27/2022)   No facility-administered encounter medications on file as of 08/30/2022.     Lab Results  Component Value Date   WBC 11.2 (H) 04/06/2022   HGB 13.8 04/06/2022   HCT 40.4 04/06/2022   PLT 297.0  04/06/2022   GLUCOSE 101 (H) 08/28/2022   CHOL 187 08/28/2022   TRIG 106.0 08/28/2022   HDL 59.10 08/28/2022   LDLDIRECT 164.2 02/11/2013   LDLCALC 107 (H) 08/28/2022   ALT 19 08/28/2022   AST 23 08/28/2022   NA 141 08/28/2022   K 4.4 08/28/2022   CL 101 08/28/2022   CREATININE 0.77 08/28/2022   BUN 14 08/28/2022   CO2 32 08/28/2022   TSH 1.02 08/28/2022   HGBA1C 5.7 08/28/2022    MM Outside Films Mammo  Result Date: 06/12/2022 This examination belongs to an  outside facility and is stored here for comparison purposes only.  Contact the originating outside institution for any associated report or interpretation.  MM Outside Films Mammo  Result Date: 06/12/2022 This examination belongs to an outside facility and is stored here for comparison purposes only.  Contact the originating outside institution for any associated report or interpretation.  MM Outside Films Mammo  Result Date: 06/12/2022 This examination belongs to an outside facility and is stored here for comparison purposes only.  Contact the originating outside institution for any associated report or interpretation.  MM Outside Films Mammo  Result Date: 06/12/2022 This examination belongs to an outside facility and is stored here for comparison purposes only.  Contact the originating outside institution for any associated report or interpretation.  MM Outside Films Mammo  Result Date: 06/12/2022 This examination belongs to an outside facility and is stored here for comparison purposes only.  Contact the originating outside institution for any associated report or interpretation.      Assessment & Plan:  Hair loss Assessment & Plan: Has noticed increased hair loss recently.  TSH wnl.  Slight increase Free T4.  Denies increased stress.   Discussed thyroid labs.  Was on biotin when labs drawn.  Recently stopped estrogen.  Discussed repeat.  Continued hair loss.  Discussed dermatology referral.    Orders: -     TSH; Future -     T4, free; Future -     Ambulatory referral to Dermatology  Anemia, unspecified type Assessment & Plan: Saw GI recently.  Planning for colonoscopy and EGD.  Follow [facility].    Colon cancer screening Assessment & Plan: Recently saw GI. Planning for colonoscopy.    Dysphagia, unspecified type Assessment & Plan: On omeprazole.  Did report she has noticed some trouble swallowing.  Saw GI 08/17/22.  Recommended continue prilosec and pepcid prn.  Scheduled for EGD.   Stable.    Gastro-esophageal reflux disease without esophagitis Assessment & Plan: Evaluated 7/12 - [name] - continue omeprazole and pepcid - prn. Planning for colonoscopy and EGD.    Hypercholesterolemia Assessment & Plan: On lipitor. Unable to titrate dose due to side effects. Low cholesterol diet and exercise.  Follow lipid panel and liver function tests. Have discussed zetia and possible repatha if problems with oral statins.  Lab Results  Component Value Date   CHOL 187 08/28/2022   HDL 59.10 08/28/2022   LDLCALC 107 (H) 08/28/2022   LDLDIRECT 164.2 02/11/2013   TRIG 106.0 08/28/2022   CHOLHDL 3 08/28/2022     Hyperglycemia Assessment & Plan: Low carb diet and exercise.  Follow met b and a1c.   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[facility]) Assessment & Plan: Chronic back pain.  Followed by pain clinic. S/p nerve block.    Non-Hodgkin's lymphoma, unspecified body region, unspecified non-Hodgkin lymphoma type [facility]) Assessment & Plan: Followed by Dr James Ivanoff.  Stable.  Follows up yearly.  Dale Cordova, MD

## 2022-09-01 ENCOUNTER — Encounter: Payer: Self-pay | Admitting: Internal Medicine

## 2022-09-01 NOTE — Assessment & Plan Note (Signed)
Evaluated 7/12 - Maria Keller - continue omeprazole and pepcid - prn. Planning for colonoscopy and EGD.

## 2022-09-01 NOTE — Assessment & Plan Note (Signed)
On lipitor. Unable to titrate dose due to side effects. Low cholesterol diet and exercise.  Follow lipid panel and liver function tests. Have discussed zetia and possible repatha if problems with oral statins.  Lab Results  Component Value Date   CHOL 187 08/28/2022   HDL 59.10 08/28/2022   LDLCALC 107 (H) 08/28/2022   LDLDIRECT 164.2 02/11/2013   TRIG 106.0 08/28/2022   CHOLHDL 3 08/28/2022

## 2022-09-01 NOTE — Assessment & Plan Note (Signed)
On omeprazole.  Did report she has noticed some trouble swallowing.  Saw GI 08/17/22.  Recommended continue prilosec and pepcid prn.  Scheduled for EGD.  Stable.

## 2022-09-01 NOTE — Assessment & Plan Note (Signed)
Saw GI recently.  Planning for colonoscopy and EGD.  Follow [facility].

## 2022-09-01 NOTE — Assessment & Plan Note (Signed)
Recently saw GI. Planning for colonoscopy.

## 2022-09-01 NOTE — Assessment & Plan Note (Signed)
Has noticed increased hair loss recently.  TSH wnl.  Slight increase Free T4.  Denies increased stress.   Discussed thyroid labs.  Was on biotin when labs drawn.  Recently stopped estrogen.  Discussed repeat.  Continued hair loss.  Discussed dermatology referral.

## 2022-09-01 NOTE — Assessment & Plan Note (Signed)
Chronic back pain.  Followed by pain clinic. S/p nerve block.

## 2022-09-01 NOTE — Assessment & Plan Note (Signed)
Low carb diet and exercise.  Follow met b and a1c.   

## 2022-09-01 NOTE — Assessment & Plan Note (Signed)
Followed by Dr DeCastro.  Stable.  Follows up yearly.  

## 2022-09-14 ENCOUNTER — Telehealth: Payer: Self-pay | Admitting: Internal Medicine

## 2022-09-14 NOTE — Telephone Encounter (Signed)
Pt called stating she is checking on a referral from the dermatologist. Pt stated no one has called her

## 2022-09-17 NOTE — Telephone Encounter (Signed)
Pt following up on dermatology referral placed 7/25

## 2022-09-18 NOTE — Telephone Encounter (Signed)
Noted  

## 2022-09-27 ENCOUNTER — Other Ambulatory Visit: Payer: Medicare Other

## 2022-10-01 ENCOUNTER — Other Ambulatory Visit (INDEPENDENT_AMBULATORY_CARE_PROVIDER_SITE_OTHER): Payer: Medicare Other

## 2022-10-01 DIAGNOSIS — L659 Nonscarring hair loss, unspecified: Secondary | ICD-10-CM

## 2022-10-01 LAB — T4, FREE: Free T4: 0.82 ng/dL (ref 0.60–1.60)

## 2022-10-01 LAB — TSH: TSH: 0.77 u[IU]/mL (ref 0.35–5.50)

## 2022-10-04 ENCOUNTER — Other Ambulatory Visit: Payer: Self-pay | Admitting: Internal Medicine

## 2022-10-22 DIAGNOSIS — C8238 Follicular lymphoma grade IIIa, lymph nodes of multiple sites: Secondary | ICD-10-CM | POA: Diagnosis not present

## 2022-10-25 ENCOUNTER — Other Ambulatory Visit: Payer: Self-pay | Admitting: Internal Medicine

## 2022-11-05 ENCOUNTER — Ambulatory Visit: Payer: Medicare Other | Admitting: Anesthesiology

## 2022-11-05 ENCOUNTER — Ambulatory Visit
Admission: RE | Admit: 2022-11-05 | Discharge: 2022-11-05 | Disposition: A | Discharge status: Home / Self Care | Payer: Medicare Other | Attending: Gastroenterology | Admitting: Gastroenterology

## 2022-11-05 ENCOUNTER — Encounter
Admission: RE | Disposition: A | Discharge status: Home / Self Care | Payer: Self-pay | Source: Home / Self Care | Attending: Gastroenterology

## 2022-11-05 DIAGNOSIS — K219 Gastro-esophageal reflux disease without esophagitis: Secondary | ICD-10-CM | POA: Insufficient documentation

## 2022-11-05 DIAGNOSIS — Z9049 Acquired absence of other specified parts of digestive tract: Secondary | ICD-10-CM | POA: Insufficient documentation

## 2022-11-05 DIAGNOSIS — Z86010 Personal history of colonic polyps: Secondary | ICD-10-CM | POA: Insufficient documentation

## 2022-11-05 DIAGNOSIS — K6389 Other specified diseases of intestine: Secondary | ICD-10-CM | POA: Diagnosis not present

## 2022-11-05 DIAGNOSIS — K449 Diaphragmatic hernia without obstruction or gangrene: Secondary | ICD-10-CM | POA: Insufficient documentation

## 2022-11-05 DIAGNOSIS — Z8719 Personal history of other diseases of the digestive system: Secondary | ICD-10-CM | POA: Diagnosis not present

## 2022-11-05 DIAGNOSIS — Z8759 Personal history of other complications of pregnancy, childbirth and the puerperium: Secondary | ICD-10-CM | POA: Insufficient documentation

## 2022-11-05 DIAGNOSIS — R1310 Dysphagia, unspecified: Secondary | ICD-10-CM | POA: Insufficient documentation

## 2022-11-05 DIAGNOSIS — E7800 Pure hypercholesterolemia, unspecified: Secondary | ICD-10-CM | POA: Diagnosis not present

## 2022-11-05 DIAGNOSIS — Q399 Congenital malformation of esophagus, unspecified: Secondary | ICD-10-CM | POA: Diagnosis not present

## 2022-11-05 DIAGNOSIS — K200 Eosinophilic esophagitis: Secondary | ICD-10-CM | POA: Insufficient documentation

## 2022-11-05 DIAGNOSIS — Z90710 Acquired absence of both cervix and uterus: Secondary | ICD-10-CM | POA: Insufficient documentation

## 2022-11-05 DIAGNOSIS — Z83719 Family history of colon polyps, unspecified: Secondary | ICD-10-CM | POA: Diagnosis not present

## 2022-11-05 DIAGNOSIS — Z8572 Personal history of non-Hodgkin lymphomas: Secondary | ICD-10-CM | POA: Diagnosis not present

## 2022-11-05 DIAGNOSIS — D123 Benign neoplasm of transverse colon: Secondary | ICD-10-CM | POA: Diagnosis not present

## 2022-11-05 DIAGNOSIS — K640 First degree hemorrhoids: Secondary | ICD-10-CM | POA: Insufficient documentation

## 2022-11-05 DIAGNOSIS — Z96651 Presence of right artificial knee joint: Secondary | ICD-10-CM | POA: Insufficient documentation

## 2022-11-05 DIAGNOSIS — K317 Polyp of stomach and duodenum: Secondary | ICD-10-CM | POA: Diagnosis not present

## 2022-11-05 DIAGNOSIS — M797 Fibromyalgia: Secondary | ICD-10-CM | POA: Diagnosis not present

## 2022-11-05 DIAGNOSIS — K649 Unspecified hemorrhoids: Secondary | ICD-10-CM | POA: Diagnosis not present

## 2022-11-05 DIAGNOSIS — G4730 Sleep apnea, unspecified: Secondary | ICD-10-CM | POA: Diagnosis not present

## 2022-11-05 DIAGNOSIS — D122 Benign neoplasm of ascending colon: Secondary | ICD-10-CM | POA: Diagnosis not present

## 2022-11-05 DIAGNOSIS — K635 Polyp of colon: Secondary | ICD-10-CM | POA: Diagnosis not present

## 2022-11-05 DIAGNOSIS — Z1211 Encounter for screening for malignant neoplasm of colon: Secondary | ICD-10-CM | POA: Diagnosis not present

## 2022-11-05 HISTORY — PX: ESOPHAGOGASTRODUODENOSCOPY (EGD) WITH PROPOFOL: SHX5813

## 2022-11-05 HISTORY — PX: POLYPECTOMY: SHX5525

## 2022-11-05 HISTORY — PX: BIOPSY: SHX5522

## 2022-11-05 HISTORY — PX: COLONOSCOPY WITH PROPOFOL: SHX5780

## 2022-11-05 SURGERY — COLONOSCOPY WITH PROPOFOL
Anesthesia: General

## 2022-11-05 MED ORDER — PROPOFOL 10 MG/ML IV BOLUS
INTRAVENOUS | Status: AC
  Filled 2022-11-05: qty 40

## 2022-11-05 MED ORDER — EPHEDRINE SULFATE (PRESSORS) 50 MG/ML IJ SOLN
INTRAMUSCULAR | Status: DC | PRN
  Administered 2022-11-05: 5 mg via INTRAVENOUS

## 2022-11-05 MED ORDER — LIDOCAINE HCL (PF) 2 % IJ SOLN
INTRAMUSCULAR | Status: AC
  Filled 2022-11-05: qty 5

## 2022-11-05 MED ORDER — LIDOCAINE HCL (CARDIAC) PF 100 MG/5ML IV SOSY
PREFILLED_SYRINGE | INTRAVENOUS | Status: DC | PRN
  Administered 2022-11-05: 80 mg via INTRAVENOUS

## 2022-11-05 MED ORDER — PROPOFOL 500 MG/50ML IV EMUL
INTRAVENOUS | Status: DC | PRN
  Administered 2022-11-05: 10 mg via INTRAVENOUS
  Administered 2022-11-05: 70 mg via INTRAVENOUS
  Administered 2022-11-05: 150 ug/kg/min via INTRAVENOUS

## 2022-11-05 MED ORDER — SODIUM CHLORIDE 0.9 % IV SOLN
INTRAVENOUS | Status: DC
  Administered 2022-11-05: 20 mL/h via INTRAVENOUS

## 2022-11-05 NOTE — Op Note (Signed)
[facility] Patient Name: Maria Keller Procedure Date: 11/05/2022 8:55 AM MRN: [id number] Account #: [id number] Date of Birth: [birth year] Admit Type: Outpatient Age: 72 Room: [facility] ENDO ROOM 3 Gender: Female Note Status: Finalized Instrument Name: Upper Endoscope [id number] Procedure:             Upper GI endoscopy Indications:           Dysphagia Providers:             Eather Colas MD, MD Referring MD:          Dale Ko Olina, MD (Referring MD) Medicines:             Monitored Anesthesia Care Complications:         No immediate complications. Estimated blood loss:                         Minimal. Procedure:             Pre-Anesthesia Assessment:                        - Prior to the procedure, a History and Physical was                         performed, and patient medications and allergies were                         reviewed. The patient is competent. The risks and                         benefits of the procedure and the sedation options and                         risks were discussed with the patient. All questions                         were answered and informed consent was obtained.                         Patient identification and proposed procedure were                         verified by the physician, the nurse, the                         anesthesiologist, the anesthetist and the technician                         in the endoscopy suite. Mental Status Examination:                         alert and oriented. Airway Examination: normal                         oropharyngeal airway and neck mobility. Respiratory                         Examination: clear to auscultation. CV Examination:  normal. Prophylactic Antibiotics: The patient does not                         require prophylactic antibiotics. Prior                         Anticoagulants: The patient has taken no anticoagulant                         or  antiplatelet agents. ASA Grade Assessment: II - A                         patient with mild systemic disease. After reviewing                         the risks and benefits, the patient was deemed in                         satisfactory condition to undergo the procedure. The                         anesthesia plan was to use monitored anesthesia care                         (MAC). Immediately prior to administration of                         medications, the patient was re-assessed for adequacy                         to receive sedatives. The heart rate, respiratory                         rate, oxygen saturations, blood pressure, adequacy of                         pulmonary ventilation, and response to care were                         monitored throughout the procedure. The physical                         status of the patient was re-assessed after the                         procedure.                        After obtaining informed consent, the endoscope was                         passed under direct vision. Throughout the procedure,                         the patient's blood pressure, pulse, and oxygen                         saturations were monitored continuously. The Endoscope  was introduced through the mouth, and advanced to the                         second part of duodenum. The upper GI endoscopy was                         accomplished without difficulty. The patient tolerated                         the procedure well. Findings:      A 3 cm hiatal hernia was present.      The lower third of the esophagus was moderately tortuous.      The exam of the esophagus was otherwise normal.      Biopsies were obtained from the proximal and distal esophagus with cold       forceps for histology of suspected eosinophilic esophagitis.      Multiple 4 to 7 mm sessile fundic gland polyps with no stigmata of       recent bleeding were found in the gastric  fundus and in the gastric body.      The examined duodenum was normal. Impression:            - 3 cm hiatal hernia.                        - Tortuous esophagus.                        - Multiple fundic gland polyps.                        - Normal examined duodenum.                        - Biopsies were taken with a cold forceps for                         evaluation of eosinophilic esophagitis. Recommendation:        - Discharge patient to home.                        - Resume previous diet.                        - Continue present medications.                        - Await pathology results.                        - Perform a barium swallow using barium in liquid and                         tablet form.                        - Return to referring physician as previously                         scheduled. Procedure Code(s):     --- Professional ---  62130, Esophagogastroduodenoscopy, flexible,                         transoral; with biopsy, single or multiple Diagnosis Code(s):     --- Professional ---                        K44.9, Diaphragmatic hernia without obstruction or                         gangrene                        Q39.9, Congenital malformation of esophagus,                         unspecified                        K31.7, Polyp of stomach and duodenum                        R13.10, Dysphagia, unspecified CPT copyright 2022 [employer]. All rights reserved. The codes documented in this report are preliminary and upon coder review may  be revised to meet current compliance requirements. Eather Colas MD, MD 11/05/2022 9:38:07 AM Number of Addenda: 0 Note Initiated On: 11/05/2022 8:55 AM Estimated Blood Loss:  Estimated blood loss was minimal.      [facility]

## 2022-11-05 NOTE — Anesthesia Postprocedure Evaluation (Signed)
Anesthesia Post Note  Patient: DEONDRA LABRADOR  Procedure(s) Performed: COLONOSCOPY WITH PROPOFOL ESOPHAGOGASTRODUODENOSCOPY (EGD) WITH PROPOFOL BIOPSY POLYPECTOMY  Patient location during evaluation: Endoscopy Anesthesia Type: General Level of consciousness: awake and alert Pain management: pain level controlled Vital Signs Assessment: post-procedure vital signs reviewed and stable Respiratory status: spontaneous breathing, nonlabored ventilation, respiratory function stable and patient connected to nasal cannula oxygen Cardiovascular status: blood pressure returned to baseline and stable Postop Assessment: no apparent nausea or vomiting Anesthetic complications: no   No notable events documented.   Last Vitals:  Vitals:   11/05/22 0945 11/05/22 0955  BP: 117/62 130/63  Pulse: 70 68  Resp: 20 19  Temp: (!) 35.3 C   SpO2: 98% 98%    Last Pain:  Vitals:   11/05/22 0955  TempSrc:   PainSc: 0-No pain                 Corinda Gubler

## 2022-11-05 NOTE — Transfer of Care (Signed)
Immediate Anesthesia Transfer of Care Note  Patient: Maria Keller  Procedure(s) Performed: COLONOSCOPY WITH PROPOFOL ESOPHAGOGASTRODUODENOSCOPY (EGD) WITH PROPOFOL BIOPSY POLYPECTOMY  Patient Location: PACU  Anesthesia Type:MAC  Level of Consciousness: awake  Airway & Oxygen Therapy: Patient Spontanous Breathing  Post-op Assessment: Report given to RN and Post -op Vital signs reviewed and stable  Post vital signs: Reviewed and stable  Last Vitals:  Vitals Value Taken Time  BP 112/64 11/05/22 0935  Temp    Pulse 74 11/05/22 0935  Resp 20 11/05/22 0935  SpO2 97 % 11/05/22 0935    Last Pain:  Vitals:   11/05/22 0935  TempSrc:   PainSc: Asleep         Complications: No notable events documented.

## 2022-11-05 NOTE — H&P (Signed)
Outpatient short stay form Pre-procedure 11/05/2022  Regis Bill, MD  Primary Physician: Dale , MD  Reason for visit:  Dysphagia/Surveillance colonoscopy  History of present illness:    72 y/o lady with history of fibromyalgia, follicular lymphoma, and IBS here for EGD for solid and liquid dysphagia and colonoscopy is for history of polyps and family history of polyps. No blood thinners. No significant abdominal surgeries.    Current Facility-Administered Medications:    0.9 %  sodium chloride infusion, , Intravenous, Continuous, Yazmen Briones, Rossie Muskrat, MD, Last Rate: 20 mL/hr at 11/05/22 0838, 20 mL/hr at 11/05/22 0838  Medications Prior to Admission  Medication Sig Dispense Refill Last Dose   acetaminophen (TYLENOL) 650 MG CR tablet Take 650 mg by mouth every 8 (eight) hours as needed for pain.   Past Week   aspirin EC 81 MG tablet Take 81 mg by mouth daily.   Past Week   atorvastatin (LIPITOR) 10 MG tablet TAKE 1 TABLET BY MOUTH EVERY DAY 90 tablet 3 11/04/2022   Biotin 41324 MCG TABS Take 1 tablet by mouth daily.   11/04/2022   calcium carbonate (OS-CAL) 1250 (500 Ca) MG chewable tablet Chew 1 tablet by mouth daily.   11/04/2022   fluticasone (FLONASE) 50 MCG/ACT nasal spray Place 2 sprays into both nostrils daily. 48 g 3 11/04/2022   furosemide (LASIX) 20 MG tablet TAKE 1 TABLET BY MOUTH DAILY AS NEEDED. 90 tablet 0 11/04/2022   Multiple Vitamin (MULTIVITAMIN) tablet Take 1 tablet by mouth daily.   Past Week   omeprazole (PRILOSEC) 20 MG capsule TAKE 1 CAPSULE BY MOUTH TWICE A DAY 180 capsule 1 11/04/2022   senna-docusate (SENOKOT-S) 8.6-50 MG per tablet Take by mouth daily.   11/04/2022   vitamin E 180 MG (400 UNITS) capsule Take 400 Units by mouth daily.   Past Week   cetirizine (ZYRTEC) 10 MG tablet Take by mouth. (Patient not taking: Reported on 11/05/2022)   Completed Course   mupirocin ointment (BACTROBAN) 2 % Apply 1 Application topically 2 (two) times daily. 22 g 0       Allergies  Allergen Reactions   Oxycontin [Oxycodone Hcl] Itching    Sleep Apnea   Penicillins Anaphylaxis and Itching   Chloral Hydrate Hives    Other Reaction: Not Assessed   Ciprofloxacin Itching   Colace [Docusate Calcium]    Methotrexate Derivatives Other (See Comments)    Questionable    Parafon Forte Dsc [Chlorzoxazone] Itching and Other (See Comments)    Other Reaction: Not Assessed   Penicillin G Hives   Tape Other (See Comments)    adhesive  tape cause redness and skin to peel   Bextra [Valdecoxib] Itching    Other reaction(s): Other (See Comments) Other Reaction: Not Assessed   Oxycodone Itching   Rofecoxib Itching    Other reaction(s): Headache (finding)     Past Medical History:  Diagnosis Date   Anemia    iron deficiency and B12 deficiency   Arthritis    Bronchitis 08/2015   Chronic back pain    followed by Dr Metta Clines   Depression    Diverticulosis    Esophageal stricture    requiring dilatation x 2   Fibromyalgia    Fibromyalgia    Fibromyalgia    Follicular lymphoma (HCC)    Followed by Dr James Ivanoff, s/p chemo and XRT   Follicular lymphoma (HCC)    GERD (gastroesophageal reflux disease)    GERD (gastroesophageal reflux disease)  Hemorrhoid    Hiatal hernia    History of kidney stones    Hypercholesterolemia    IBS (irritable bowel syndrome)    IBS (irritable bowel syndrome)    Nephrolithiasis    followed by Dr Lonna Cobb, s/p stents   PONV (postoperative nausea and vomiting)    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   Shingles outbreak 11/26/2011   Sleep apnea     Review of systems:  Otherwise negative.    Physical Exam  Gen: Alert, oriented. Appears stated age.  HEENT: PERRLA. Lungs: No respiratory distress CV: RRR Abd: soft, benign, no masses Ext: No edema    Planned procedures: Proceed with EGD/colonoscopy. The patient understands the nature of the planned procedure,  indications, risks, alternatives and potential complications including but not limited to bleeding, infection, perforation, damage to internal organs and possible oversedation/side effects from anesthesia. The patient agrees and gives consent to proceed.  Please refer to procedure notes for findings, recommendations and patient disposition/instructions.     Regis Bill, MD [facility]

## 2022-11-05 NOTE — Interval H&P Note (Signed)
History and Physical Interval Note:  11/05/2022 8:58 AM  Maria Keller  has presented today for surgery, with the diagnosis of FH Colon Polyps Dysphagia Schatzkis Ring.  The various methods of treatment have been discussed with the patient and family. After consideration of risks, benefits and other options for treatment, the patient has consented to  Procedure(s): COLONOSCOPY WITH PROPOFOL (N/A) ESOPHAGOGASTRODUODENOSCOPY (EGD) WITH PROPOFOL (N/A) as a surgical intervention.  The patient's history has been reviewed, patient examined, no change in status, stable for surgery.  I have reviewed the patient's chart and labs.  Questions were answered to the patient's satisfaction.     Regis Bill  Ok to proceed with EGD/Colonoscopy

## 2022-11-05 NOTE — Anesthesia Procedure Notes (Signed)
Procedure Name: MAC Date/Time: 11/05/2022 9:02 AM  Performed by: Hezzie Bump, CRNAPre-anesthesia Checklist: Patient identified, Emergency Drugs available, Suction available and Patient being monitored Patient Re-evaluated:Patient Re-evaluated prior to induction Oxygen Delivery Method: Simple face mask Induction Type: IV induction Placement Confirmation: positive ETCO2

## 2022-11-05 NOTE — Anesthesia Preprocedure Evaluation (Signed)
Anesthesia Evaluation  Patient identified by MRN, date of birth, ID band Patient awake    Reviewed: Allergy & Precautions, NPO status , Patient's Chart, lab work & pertinent test results  History of Anesthesia Complications (+) PONV and history of anesthetic complications  Airway Mallampati: II  TM Distance: >3 FB Neck ROM: full    Dental no notable dental hx. (+) Dental Advidsory Given, Poor Dentition   Pulmonary neg pulmonary ROS, neg shortness of breath, neg sleep apnea, neg COPD, Patient abstained from smoking.Not current smoker   Pulmonary exam normal breath sounds clear to auscultation       Cardiovascular Exercise Tolerance: Good METS(-) hypertension(-) angina (-) CAD and (-) Past MI negative cardio ROS Normal cardiovascular exam(-) dysrhythmias  Rhythm:Regular Rate:Normal - Systolic murmurs    Neuro/Psych  PSYCHIATRIC DISORDERS  Depression    negative neurological ROS     GI/Hepatic Neg liver ROS, hiatal hernia,GERD  Medicated,,  Endo/Other  negative endocrine ROSneg diabetes    Renal/GU negative Renal ROS     Musculoskeletal  (+) Arthritis ,  Fibromyalgia -  Abdominal   Peds  Hematology negative hematology ROS (+)   Anesthesia Other Findings Past Medical History: No date: Anemia     Comment:  iron deficiency and B12 deficiency No date: Arthritis 08/2015: Bronchitis No date: Chronic back pain     Comment:  followed by Dr Metta Clines No date: Depression No date: Diverticulosis No date: Esophageal stricture     Comment:  requiring dilatation x 2 No date: Fibromyalgia No date: Fibromyalgia No date: Fibromyalgia No 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HCC)     Comment:  Followed by Dr James Ivanoff, s/p chemo and XRT No 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HCC) No date: GERD (gastroesophageal reflux disease) No date: GERD (gastroesophageal reflux disease) No date: Hemorrhoid No date: Hiatal hernia No date: History of kidney  stones No date: Hypercholesterolemia No date: IBS (irritable bowel syndrome) No date: IBS (irritable bowel syndrome) No date: Nephrolithiasis     Comment:  followed by Dr Lonna Cobb, s/p stents No date: PONV (postoperative nausea and vomiting)     Comment:  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 11/26/2011: Shingles outbreak No date: Sleep apnea  Past Surgical History: No date: APPENDECTOMY 1993: BLADDER SURGERY No date: BREAST BIOPSY; N/A No date: capsule endoscopy 1995: CHOLECYSTECTOMY 06/19/2017: COLONOSCOPY WITH PROPOFOL; N/A     Comment:  Procedure: COLONOSCOPY WITH PROPOFOL;  Surgeon: Scot Jun, MD;  Location: [facility] ENDOSCOPY;  Service:               Endoscopy;  Laterality: N/A; 06/19/2017: ESOPHAGOGASTRODUODENOSCOPY (EGD) WITH PROPOFOL; N/A     Comment:  Procedure: ESOPHAGOGASTRODUODENOSCOPY (EGD) WITH               PROPOFOL;  Surgeon: Scot Jun, MD;  Location:               [facility] ENDOSCOPY;  Service: Endoscopy;  Laterality: N/A; No date: EYE SURGERY     Comment:  both cataract remove No date: FRACTURE SURGERY; Left     Comment:  ankle, plate with 8 screws No date: history of helicobacter pylori infection No date: JOINT REPLACEMENT; Right     Comment:  knee No date: LEFT OOPHORECTOMY No date: LEG SURGERY; Left 2012: ORIF ANKLE FRACTURE No date: OVARIAN  CYST REMOVAL     Comment:  right No date: RECTOCELE REPAIR; N/A 03/19/2016: REPLACEMENT TOTAL KNEE; Right 1970: TONSILECTOMY/ADENOIDECTOMY WITH MYRINGOTOMY No date: TONSILLECTOMY; N/A 1976: TUBAL LIGATION 1980's: VAGINAL HYSTERECTOMY     Comment:  secondary to bleeding  BMI    Body Mass Index: 26.63 kg/m      Reproductive/Obstetrics negative OB ROS                             Anesthesia Physical Anesthesia Plan  ASA: 2  Anesthesia Plan: General   Post-op Pain Management: Minimal or no pain  anticipated   Induction: Intravenous  PONV Risk Score and Plan: 3 and Propofol infusion, TIVA and Ondansetron  Airway Management Planned: Nasal Cannula  Additional Equipment: None  Intra-op Plan:   Post-operative Plan:   Informed Consent: I have reviewed the patients History and Physical, chart, labs and discussed the procedure including the risks, benefits and alternatives for the proposed anesthesia with the patient or authorized representative who has indicated his/her understanding and acceptance.     Dental advisory given  Plan Discussed with: CRNA and Surgeon  Anesthesia Plan Comments: (Discussed risks of anesthesia with patient, including possibility of difficulty with spontaneous ventilation under anesthesia necessitating airway intervention, PONV, and rare risks such as cardiac or respiratory or neurological events, and allergic reactions. Discussed the role of CRNA in patient's perioperative care. Patient understands.)        Anesthesia Quick Evaluation

## 2022-11-05 NOTE — Op Note (Signed)
[facility] Gastroenterology Patient Name: Maria Keller Procedure Date: 11/05/2022 8:54 AM MRN: [id number] Account #: [id number] Date of Birth: [birth year] Admit Type: Outpatient Age: 72 Room: [facility] ENDO ROOM 3 Gender: Female Note Status: Supervisor Override Instrument Name: Prentice Docker [id number] Procedure:             Colonoscopy Indications:           Family history of colon polyps Providers:             Eather Colas MD, MD Referring MD:          Dale Norway, MD (Referring MD) Medicines:             Monitored Anesthesia Care Complications:         No immediate complications. Estimated blood loss:                         Minimal. Procedure:             Pre-Anesthesia Assessment:                        - Prior to the procedure, a History and Physical was                         performed, and patient medications and allergies were                         reviewed. The patient is competent. The risks and                         benefits of the procedure and the sedation options and                         risks were discussed with the patient. All questions                         were answered and informed consent was obtained.                         Patient identification and proposed procedure were                         verified by the physician, the nurse, the                         anesthesiologist, the anesthetist and the technician                         in the endoscopy suite. Mental Status Examination:                         alert and oriented. Airway Examination: normal                         oropharyngeal airway and neck mobility. Respiratory                         Examination: clear to auscultation. CV Examination:  normal. Prophylactic Antibiotics: The patient does not                         require prophylactic antibiotics. Prior                         Anticoagulants: The patient has taken no anticoagulant                          or antiplatelet agents. ASA Grade Assessment: II - A                         patient with mild systemic disease. After reviewing                         the risks and benefits, the patient was deemed in                         satisfactory condition to undergo the procedure. The                         anesthesia plan was to use monitored anesthesia care                         (MAC). Immediately prior to administration of                         medications, the patient was re-assessed for adequacy                         to receive sedatives. The heart rate, respiratory                         rate, oxygen saturations, blood pressure, adequacy of                         pulmonary ventilation, and response to care were                         monitored throughout the procedure. The physical                         status of the patient was re-assessed after the                         procedure.                        After obtaining informed consent, the colonoscope was                         passed under direct vision. Throughout the procedure,                         the patient's blood pressure, pulse, and oxygen                         saturations were monitored continuously. The  Colonoscope was introduced through the anus and                         advanced to the the cecum, identified by appendiceal                         orifice and ileocecal valve. The colonoscopy was                         performed without difficulty. The patient tolerated                         the procedure well. The quality of the bowel                         preparation was good. The ileocecal valve, appendiceal                         orifice, and rectum were photographed. Findings:      The perianal and digital rectal examinations were normal.      A diffuse area of mild melanosis was found in the proximal ascending       colon and in the cecum.      A 2 mm  polyp was found in the ascending colon. The polyp was sessile.       The polyp was removed with a jumbo cold forceps. Resection and retrieval       were complete. Estimated blood loss was minimal.      A 3 mm polyp was found in the distal ascending colon. The polyp was       sessile. The polyp was removed with a cold snare. Resection and       retrieval were complete. Estimated blood loss was minimal.      Two sessile polyps were found in the transverse colon. The polyps were 2       to 3 mm in size. These polyps were removed with a cold snare. Resection       and retrieval were complete. Estimated blood loss was minimal.      Internal hemorrhoids were found during retroflexion. The hemorrhoids       were Grade I (internal hemorrhoids that do not prolapse).      The exam was otherwise without abnormality on direct and retroflexion       views. Impression:            - Melanosis in the colon.                        - One 2 mm polyp in the ascending colon, removed with                         a jumbo cold forceps. Resected and retrieved.                        - One 3 mm polyp in the distal ascending colon,                         removed with a cold snare. Resected and retrieved.                        -  Two 2 to 3 mm polyps in the transverse colon,                         removed with a cold snare. Resected and retrieved.                        - Internal hemorrhoids.                        - The examination was otherwise normal on direct and                         retroflexion views. Recommendation:        - Discharge patient to home.                        - Resume previous diet.                        - Continue present medications.                        - Await pathology results.                        - Repeat colonoscopy in 3 - 5 years for surveillance.                        - Return to referring physician as previously                         scheduled. Procedure Code(s):      --- Professional ---                        [phone], Colonoscopy, flexible; with removal of                         tumor(s), polyp(s), or other lesion(s) by snare                         technique                        45380, 59, Colonoscopy, flexible; with biopsy, single                         or multiple Diagnosis Code(s):     --- Professional ---                        Z86.010, Personal history of colonic polyps                        K63.89, Other specified diseases of intestine                        D12.2, Benign neoplasm of ascending colon                        D12.3, Benign neoplasm of transverse colon (hepatic  flexure or splenic flexure)                        K64.0, First degree hemorrhoids CPT copyright 2022 American Medical Association. All rights reserved. The codes documented in this report are preliminary and upon coder review may  be revised to meet current compliance requirements. Eather Colas MD, MD 11/05/2022 9:43:55 AM Number of Addenda: 0 Note Initiated On: 11/05/2022 8:54 AM Scope Withdrawal Time: 0 hours 10 minutes 5 seconds  Total Procedure Duration: 0 hours 13 minutes 59 seconds  Estimated Blood Loss:  Estimated blood loss was minimal.      [facility]

## 2022-11-06 ENCOUNTER — Encounter: Payer: Self-pay | Admitting: Gastroenterology

## 2022-11-06 LAB — SURGICAL PATHOLOGY

## 2022-11-16 ENCOUNTER — Other Ambulatory Visit: Payer: Self-pay | Admitting: Nurse Practitioner

## 2022-11-16 DIAGNOSIS — R198 Other specified symptoms and signs involving the digestive system and abdomen: Secondary | ICD-10-CM

## 2022-11-22 ENCOUNTER — Ambulatory Visit
Admission: RE | Admit: 2022-11-22 | Discharge: 2022-11-22 | Disposition: A | Discharge status: Home / Self Care | Payer: Medicare Other | Source: Ambulatory Visit | Attending: Nurse Practitioner | Admitting: Nurse Practitioner

## 2022-11-22 DIAGNOSIS — R198 Other specified symptoms and signs involving the digestive system and abdomen: Secondary | ICD-10-CM | POA: Diagnosis not present

## 2022-11-22 DIAGNOSIS — K219 Gastro-esophageal reflux disease without esophagitis: Secondary | ICD-10-CM | POA: Diagnosis not present

## 2022-11-22 DIAGNOSIS — R1310 Dysphagia, unspecified: Secondary | ICD-10-CM | POA: Diagnosis not present

## 2022-12-13 ENCOUNTER — Ambulatory Visit
Admission: RE | Admit: 2022-12-13 | Discharge: 2022-12-13 | Disposition: A | Discharge status: Home / Self Care | Payer: Medicare Other | Source: Ambulatory Visit | Attending: Internal Medicine | Admitting: Internal Medicine

## 2022-12-13 DIAGNOSIS — R92333 Mammographic heterogeneous density, bilateral breasts: Secondary | ICD-10-CM | POA: Diagnosis not present

## 2022-12-13 DIAGNOSIS — R928 Other abnormal and inconclusive findings on diagnostic imaging of breast: Secondary | ICD-10-CM

## 2022-12-13 DIAGNOSIS — N6312 Unspecified lump in the right breast, upper inner quadrant: Secondary | ICD-10-CM | POA: Diagnosis not present

## 2022-12-14 ENCOUNTER — Telehealth: Payer: Self-pay

## 2022-12-14 NOTE — Telephone Encounter (Signed)
-----   Message from [place] sent at 12/14/2022  4:14 AM EST ----- Please call and notify that I reviewed her f/u mammogram and ultrasound and they were ok.  Radiology felt the changes were c/w a benign cyst.  Recommended to return to routine screening with screening mammogram due in 06/2023.

## 2022-12-21 ENCOUNTER — Telehealth: Payer: Self-pay | Admitting: Internal Medicine

## 2022-12-21 DIAGNOSIS — R739 Hyperglycemia, unspecified: Secondary | ICD-10-CM

## 2022-12-21 DIAGNOSIS — E7800 Pure hypercholesterolemia, unspecified: Secondary | ICD-10-CM

## 2022-12-21 DIAGNOSIS — D649 Anemia, unspecified: Secondary | ICD-10-CM

## 2022-12-21 NOTE — Telephone Encounter (Signed)
Orders placed for labs

## 2022-12-21 NOTE — Telephone Encounter (Signed)
Patient need lab orders.

## 2022-12-27 ENCOUNTER — Other Ambulatory Visit (INDEPENDENT_AMBULATORY_CARE_PROVIDER_SITE_OTHER): Payer: Medicare Other

## 2022-12-27 DIAGNOSIS — D649 Anemia, unspecified: Secondary | ICD-10-CM

## 2022-12-27 DIAGNOSIS — E7800 Pure hypercholesterolemia, unspecified: Secondary | ICD-10-CM

## 2022-12-27 DIAGNOSIS — R739 Hyperglycemia, unspecified: Secondary | ICD-10-CM

## 2022-12-27 LAB — LIPID PANEL
Cholesterol: 176 mg/dL (ref 0–200)
HDL: 49.7 mg/dL (ref >39.00)
LDL Cholesterol: 104 mg/dL — ABNORMAL HIGH (ref 0–99)
NonHDL: 126.68
Total CHOL/HDL Ratio: 4
Triglycerides: 115 mg/dL (ref 0.0–149.0)
VLDL: 23 mg/dL (ref 0.0–40.0)

## 2022-12-27 LAB — BASIC METABOLIC PANEL
BUN: 13 mg/dL (ref 6–23)
CO2: 31 meq/L (ref 19–32)
Calcium: 9.9 mg/dL (ref 8.4–10.5)
Chloride: 101 meq/L (ref 96–112)
Creatinine, Ser: 0.82 mg/dL (ref 0.40–1.20)
GFR: 70.63 mL/min (ref >60.00)
Glucose, Bld: 101 mg/dL — ABNORMAL HIGH (ref 70–99)
Potassium: 4.7 meq/L (ref 3.5–5.1)
Sodium: 140 meq/L (ref 135–145)

## 2022-12-27 LAB — CBC WITH DIFFERENTIAL/PLATELET
Basophils Absolute: 0 10*3/uL (ref 0.0–0.1)
Basophils Relative: 0.5 % (ref 0.0–3.0)
Eosinophils Absolute: 0.1 10*3/uL (ref 0.0–0.7)
Eosinophils Relative: 0.9 % (ref 0.0–5.0)
HCT: 38.8 % (ref 36.0–46.0)
Hemoglobin: 12.8 g/dL (ref 12.0–15.0)
Lymphocytes Relative: 39.6 % (ref 12.0–46.0)
Lymphs Abs: 3 10*3/uL (ref 0.7–4.0)
MCHC: 33.1 g/dL (ref 30.0–36.0)
MCV: 85.8 fL (ref 78.0–100.0)
Monocytes Absolute: 0.5 10*3/uL (ref 0.1–1.0)
Monocytes Relative: 6.1 % (ref 3.0–12.0)
Neutro Abs: 4 10*3/uL (ref 1.4–7.7)
Neutrophils Relative %: 52.9 % (ref 43.0–77.0)
Platelets: 215 10*3/uL (ref 150.0–400.0)
RBC: 4.52 Mil/uL (ref 3.87–5.11)
RDW: 14.7 % (ref 11.5–15.5)
WBC: 7.6 10*3/uL (ref 4.0–10.5)

## 2022-12-27 LAB — HEPATIC FUNCTION PANEL
ALT: 14 U/L (ref 0–35)
AST: 18 U/L (ref 0–37)
Albumin: 4.4 g/dL (ref 3.5–5.2)
Alkaline Phosphatase: 66 U/L (ref 39–117)
Bilirubin, Direct: 0.1 mg/dL (ref 0.0–0.3)
Total Bilirubin: 0.5 mg/dL (ref 0.2–1.2)
Total Protein: 7 g/dL (ref 6.0–8.3)

## 2022-12-27 LAB — TSH: TSH: 1.18 u[IU]/mL (ref 0.35–5.50)

## 2022-12-27 LAB — T4, FREE: Free T4: 0.74 ng/dL (ref 0.60–1.60)

## 2022-12-27 LAB — HEMOGLOBIN A1C: Hgb A1c MFr Bld: 5.6 % (ref 4.6–6.5)

## 2023-01-01 ENCOUNTER — Encounter: Payer: Self-pay | Admitting: Internal Medicine

## 2023-01-01 ENCOUNTER — Ambulatory Visit: Payer: Medicare Other | Admitting: Internal Medicine

## 2023-01-01 VITALS — BP 134/84 | HR 107 | Temp 98.1°F | Ht 67.0 in | Wt 173.2 lb

## 2023-01-01 DIAGNOSIS — K219 Gastro-esophageal reflux disease without esophagitis: Secondary | ICD-10-CM | POA: Diagnosis not present

## 2023-01-01 DIAGNOSIS — L659 Nonscarring hair loss, unspecified: Secondary | ICD-10-CM

## 2023-01-01 DIAGNOSIS — R739 Hyperglycemia, unspecified: Secondary | ICD-10-CM | POA: Diagnosis not present

## 2023-01-01 DIAGNOSIS — Z23 Encounter for immunization: Secondary | ICD-10-CM | POA: Diagnosis not present

## 2023-01-01 DIAGNOSIS — R1310 Dysphagia, unspecified: Secondary | ICD-10-CM

## 2023-01-01 DIAGNOSIS — C8590 Non-Hodgkin lymphoma, unspecified, unspecified site: Secondary | ICD-10-CM | POA: Diagnosis not present

## 2023-01-01 DIAGNOSIS — D649 Anemia, unspecified: Secondary | ICD-10-CM

## 2023-01-01 DIAGNOSIS — E7800 Pure hypercholesterolemia, unspecified: Secondary | ICD-10-CM | POA: Diagnosis not present

## 2023-01-01 MED ORDER — MUPIROCIN 2 % EX OINT: 1.0000 | TOPICAL_OINTMENT | Freq: Two times a day (BID) | CUTANEOUS | 0 refills | Status: DC

## 2023-01-01 MED ORDER — FUROSEMIDE 20 MG PO TABS: ORAL_TABLET | ORAL | 0 refills | Status: DC

## 2023-01-01 NOTE — Progress Notes (Signed)
Subjective:    Patient ID: Maria Keller, female    DOB: 1950-09-16, 72 y.o.   MRN: [id number]  Patient here for  Chief Complaint  Patient presents with   Annual Exam    HPI Here for a scheduled follow up - here for f/u regarding hypercholesterolemia, GERD and cough. Has seen [facility] 01/03/22 - compression hose - for lymphedema. Was having issues with dysphagia. Saw GI.  Recommended EGD and colonoscopy. Also recommended continuing omeprazole bid and pepcid at bedtime. Colonoscopy (11/05/22) - Melanosis in the colon. One 2 mm polyp in the ascending colon,one 3 mm polyp in the distal ascending colon,Two 2 to 3 mm polyps in the transverse colon, Internal hemorrhoids. Recommended f/u colonoscopy in 3-5 years. EGD - hiatal hernia, tortuous esophagus, multiple fundic gland polyps. Just evaluated by hematology/oncology. Released.  Overall doing well.  Tries to stay active.  No chest pain or sob reported. No abdominal pain reported.    Past Medical History:  Diagnosis Date   Anemia    iron deficiency and B12 deficiency   Arthritis    Bronchitis 08/2015   Chronic back pain    followed by Dr Metta Clines   Depression    Diverticulosis    Esophageal stricture    requiring dilatation x 2   Fibromyalgia    Fibromyalgia    Fibromyalgia    Follicular lymphoma (HCC)    Followed by Dr James Ivanoff, s/p chemo and XRT   Follicular lymphoma (HCC)    GERD (gastroesophageal reflux disease)    GERD (gastroesophageal reflux disease)    Hemorrhoid    Hiatal hernia    History of kidney stones    Hypercholesterolemia    IBS (irritable bowel syndrome)    IBS (irritable bowel syndrome)    Nephrolithiasis    followed by Dr Lonna Cobb, s/p stents   PONV (postoperative nausea and vomiting)    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   Shingles outbreak 11/26/2011   Sleep apnea    Past Surgical History:  Procedure Laterality Date   APPENDECTOMY     BIOPSY  11/05/2022    Procedure: BIOPSY;  Surgeon: Regis Bill, MD;  Location: [facility] ENDOSCOPY;  Service: Endoscopy;;   BLADDER SURGERY  1993   BREAST BIOPSY N/A    capsule endoscopy     CHOLECYSTECTOMY  1995   COLONOSCOPY WITH PROPOFOL N/A 06/19/2017   Procedure: COLONOSCOPY WITH PROPOFOL;  Surgeon: Scot Jun, MD;  Location: [facility] ENDOSCOPY;  Service: Endoscopy;  Laterality: N/A;   COLONOSCOPY WITH PROPOFOL N/A 11/05/2022   Procedure: COLONOSCOPY WITH PROPOFOL;  Surgeon: Regis Bill, MD;  Location: [facility] ENDOSCOPY;  Service: Endoscopy;  Laterality: N/A;   ESOPHAGOGASTRODUODENOSCOPY (EGD) WITH PROPOFOL N/A 06/19/2017   Procedure: ESOPHAGOGASTRODUODENOSCOPY (EGD) WITH PROPOFOL;  Surgeon: Scot Jun, MD;  Location: [facility] ENDOSCOPY;  Service: Endoscopy;  Laterality: N/A;   ESOPHAGOGASTRODUODENOSCOPY (EGD) WITH PROPOFOL N/A 11/05/2022   Procedure: ESOPHAGOGASTRODUODENOSCOPY (EGD) WITH PROPOFOL;  Surgeon: Regis Bill, MD;  Location: [facility] ENDOSCOPY;  Service: Endoscopy;  Laterality: N/A;   EYE SURGERY     both cataract remove   FRACTURE SURGERY Left    ankle, plate with 8 screws   history of helicobacter pylori infection     JOINT REPLACEMENT Right    knee   LEFT OOPHORECTOMY     LEG SURGERY Left    ORIF ANKLE FRACTURE  2012   OVARIAN CYST REMOVAL     right  POLYPECTOMY  11/05/2022   Procedure: POLYPECTOMY;  Surgeon: Regis Bill, MD;  Location: [facility] ENDOSCOPY;  Service: Endoscopy;;   RECTOCELE REPAIR N/A    REPLACEMENT TOTAL KNEE Right 03/19/2016   TONSILECTOMY/ADENOIDECTOMY WITH MYRINGOTOMY  1970   TONSILLECTOMY N/A    TOTAL KNEE REVISION Right 11/07/2021   Procedure: TOTAL KNEE REVISION;  Surgeon: Christena Flake, MD;  Location: [facility] ORS;  Service: Orthopedics;  Laterality: Right;   TUBAL LIGATION  1976   VAGINAL HYSTERECTOMY  1980's   secondary to bleeding   Family History  Problem Relation Age of Onset   Ulcers Mother    Ulcers Father    Breast cancer Other         great aunt and grandfather   Lung cancer Other        uncle   Hypertension Maternal Grandmother    Heart disease Maternal Grandmother        MI 72s   Diabetes Maternal Grandmother    Arthritis Maternal Grandmother    Diabetes Maternal Grandfather    Social History   Socioeconomic History   Marital status: Married    Spouse name: [name]   Number of children: Not on file   Years of education: Not on file   Highest education level: Associate degree: academic program  Occupational History   Not on file  Tobacco Use   Smoking status: Never   Smokeless tobacco: Never  Vaping Use   Vaping status: Never Used  Substance and Sexual Activity   Alcohol use: No    Alcohol/week: 0.0 standard drinks of alcohol   Drug use: No   Sexual activity: Yes  Other Topics Concern   Not on file  Social History Narrative   Not on file   Social Determinants of Health   Financial Resource Strain: Low Risk  (08/28/2022)   Overall Financial Resource Strain (CARDIA)    Difficulty of Paying Living Expenses: Not very hard  Food Insecurity: No Food Insecurity (08/28/2022)   Hunger Vital Sign    Worried About Running Out of Food in the Last Year: Never true    Ran Out of Food in the Last Year: Never true  Transportation Needs: No Transportation Needs (08/28/2022)   PRAPARE - Administrator, Civil Service (Medical): No    Lack of Transportation (Non-Medical): No  Physical Activity: Insufficiently Active (08/28/2022)   Exercise Vital Sign    Days of Exercise per Week: 2 days    Minutes of Exercise per Session: 10 min  Stress: No Stress Concern Present (08/28/2022)   [employer] of Occupational Health - Occupational Stress Questionnaire    Feeling of Stress : Not at all  Social Connections: Socially Integrated (08/28/2022)   Social Connection and Isolation Panel [NHANES]    Frequency of Communication with Friends and Family: More than three times a week    Frequency of Social  Gatherings with Friends and Family: More than three times a week    Attends Religious Services: More than 4 times per year    Active Member of [employer] or Organizations: Yes    Attends Engineer, structural: More than 4 times per year    Marital Status: Married     Review of Systems  Constitutional:  Negative for appetite change and unexpected weight change.  HENT:  Negative for congestion and sinus pressure.   Respiratory:  Negative for cough, chest tightness and shortness of breath.   Cardiovascular:  Negative for chest pain, palpitations  and leg swelling.  Gastrointestinal:  Negative for abdominal pain, diarrhea, nausea and vomiting.  Genitourinary:  Negative for difficulty urinating and dysuria.  Musculoskeletal:  Negative for joint swelling and myalgias.  Skin:  Negative for color change and rash.  Neurological:  Negative for dizziness and headaches.  Psychiatric/Behavioral:  Negative for agitation and dysphoric mood.        Objective:     BP 134/84   Pulse (!) 107   Temp 98.1 F (36.7 C)   Ht 5\' 7"  (1.702 m)   Wt 173 lb 3.2 oz (78.6 kg)   SpO2 95%   BMI 27.13 kg/m  Wt Readings from Last 3 Encounters:  01/01/23 173 lb 3.2 oz (78.6 kg)  11/05/22 175 lb (79.4 kg)  08/30/22 176 lb (79.8 kg)    Physical Exam Vitals reviewed.  Constitutional:      General: She is not in acute distress.    Appearance: Normal appearance.  HENT:     Head: Normocephalic and atraumatic.     Right Ear: External ear normal.     Left Ear: External ear normal.  Eyes:     General: No scleral icterus.       Right eye: No discharge.        Left eye: No discharge.     Conjunctiva/sclera: Conjunctivae normal.  Neck:     Thyroid: No thyromegaly.  Cardiovascular:     Rate and Rhythm: Normal rate and regular rhythm.  Pulmonary:     Effort: No respiratory distress.     Breath sounds: Normal breath sounds. No wheezing.  Abdominal:     General: Bowel sounds are normal.     Palpations:  Abdomen is soft.     Tenderness: There is no abdominal tenderness.  Musculoskeletal:        General: No swelling or tenderness.     Cervical back: Neck supple. No tenderness.  Lymphadenopathy:     Cervical: No cervical adenopathy.  Skin:    Findings: No erythema or rash.  Neurological:     Mental Status: She is alert.  Psychiatric:        Mood and Affect: Mood normal.        Behavior: Behavior normal.      Outpatient Encounter Medications as of 01/01/2023  Medication Sig   acetaminophen (TYLENOL) 650 MG CR tablet Take 650 mg by mouth every 8 (eight) hours as needed for pain.   aspirin EC 81 MG tablet Take 81 mg by mouth daily.   atorvastatin (LIPITOR) 10 MG tablet TAKE 1 TABLET BY MOUTH EVERY DAY   Biotin 19147 MCG TABS Take 1 tablet by mouth daily.   calcium carbonate (OS-CAL) 1250 (500 Ca) MG chewable tablet Chew 1 tablet by mouth daily.   cetirizine (ZYRTEC) 10 MG tablet Take by mouth.   fluticasone (FLONASE) 50 MCG/ACT nasal spray Place 2 sprays into both nostrils daily.   Multiple Vitamin (MULTIVITAMIN) tablet Take 1 tablet by mouth daily.   omeprazole (PRILOSEC) 20 MG capsule TAKE 1 CAPSULE BY MOUTH TWICE A DAY   senna-docusate (SENOKOT-S) 8.6-50 MG per tablet Take by mouth daily.   vitamin E 180 MG (400 UNITS) capsule Take 400 Units by mouth daily.   [DISCONTINUED] furosemide (LASIX) 20 MG tablet TAKE 1 TABLET BY MOUTH DAILY AS NEEDED.   [DISCONTINUED] mupirocin ointment (BACTROBAN) 2 % Apply 1 Application topically 2 (two) times daily.   furosemide (LASIX) 20 MG tablet TAKE 1 TABLET BY MOUTH DAILY AS NEEDED.  mupirocin ointment (BACTROBAN) 2 % Apply 1 Application topically 2 (two) times daily.   No facility-administered encounter medications on file as of 01/01/2023.     Lab Results  Component Value Date   WBC 7.6 12/27/2022   HGB 12.8 12/27/2022   HCT 38.8 12/27/2022   PLT 215.0 12/27/2022   GLUCOSE 101 (H) 12/27/2022   CHOL 176 12/27/2022   TRIG 115.0  12/27/2022   HDL 49.70 12/27/2022   LDLDIRECT 164.2 02/11/2013   LDLCALC 104 (H) 12/27/2022   ALT 14 12/27/2022   AST 18 12/27/2022   NA 140 12/27/2022   K 4.7 12/27/2022   CL 101 12/27/2022   CREATININE 0.82 12/27/2022   BUN 13 12/27/2022   CO2 31 12/27/2022   TSH 1.18 12/27/2022   HGBA1C 5.6 12/27/2022    MM 3D DIAGNOSTIC MAMMOGRAM UNILATERAL RIGHT BREAST  Result Date: 12/13/2022 CLINICAL DATA:  Six-month follow-up for a probably benign mass in the right breast, initially assessed on Jun 11, 2022. EXAM: DIGITAL DIAGNOSTIC UNILATERAL RIGHT MAMMOGRAM WITH TOMOSYNTHESIS AND CAD; ULTRASOUND RIGHT BREAST LIMITED TECHNIQUE: Right digital diagnostic mammography and breast tomosynthesis was performed. The images were evaluated with computer-aided detection. ; Targeted ultrasound examination of the right breast was performed COMPARISON:  Previous exam(s). ACR Breast Density Category c: The breasts are heterogeneously dense, which may obscure small masses. FINDINGS: MAMMOGRAM: Diagnostic views of the right breast demonstrate decreased conspicuity of the 4 mm oval circumscribed mass in the upper slightly inner position posterior depth (spot CC image 34/54). No new suspicious mass, calcification, or other findings are identified in the right breast. ULTRASOUND: Targeted right breast ultrasound was performed. At 1 o'clock 4 cm from the nipple, there is an oval predominantly anechoic mass, without internal vascularity. It measures 3 x 2 x 3 mm, previously measuring 5 x 3 x 4 mm in May 2024. This is consistent with a fluctuating benign cyst. IMPRESSION: Decreased size of the mass in the right breast 1 o'clock position, consistent with a fluctuating benign cyst. No evidence of malignancy. RECOMMENDATION: Return to routine screening mammography is recommended. The patient will be due for screening in May 2025. I have discussed the findings and recommendations with the patient. If applicable, a reminder letter will  be sent to the patient regarding the next appointment. BI-RADS CATEGORY  2: Benign. Electronically Signed   By: Jacob Moores M.D.   On: 12/13/2022 10:45   [place] LIMITED ULTRASOUND INCLUDING AXILLA RIGHT BREAST  Result Date: 12/13/2022 CLINICAL DATA:  Six-month follow-up for a probably benign mass in the right breast, initially assessed on Jun 11, 2022. EXAM: DIGITAL DIAGNOSTIC UNILATERAL RIGHT MAMMOGRAM WITH TOMOSYNTHESIS AND CAD; ULTRASOUND RIGHT BREAST LIMITED TECHNIQUE: Right digital diagnostic mammography and breast tomosynthesis was performed. The images were evaluated with computer-aided detection. ; Targeted ultrasound examination of the right breast was performed COMPARISON:  Previous exam(s). ACR Breast Density Category c: The breasts are heterogeneously dense, which may obscure small masses. FINDINGS: MAMMOGRAM: Diagnostic views of the right breast demonstrate decreased conspicuity of the 4 mm oval circumscribed mass in the upper slightly inner position posterior depth (spot CC image 34/54). No new suspicious mass, calcification, or other findings are identified in the right breast. ULTRASOUND: Targeted right breast ultrasound was performed. At 1 o'clock 4 cm from the nipple, there is an oval predominantly anechoic mass, without internal vascularity. It measures 3 x 2 x 3 mm, previously measuring 5 x 3 x 4 mm in May 2024. This is consistent with a fluctuating  benign cyst. IMPRESSION: Decreased size of the mass in the right breast 1 o'clock position, consistent with a fluctuating benign cyst. No evidence of malignancy. RECOMMENDATION: Return to routine screening mammography is recommended. The patient will be due for screening in May 2025. I have discussed the findings and recommendations with the patient. If applicable, a reminder letter will be sent to the patient regarding the next appointment. BI-RADS CATEGORY  2: Benign. Electronically Signed   By: Jacob Moores M.D.   On: 12/13/2022 10:45        Assessment & Plan:  Hypercholesterolemia Assessment & Plan: On lipitor. Unable to titrate dose due to side effects. Low cholesterol diet and exercise.  Follow lipid panel and liver function tests. Have discussed zetia and possible repatha if problems with oral statins. Continue at current dose. Follow.  Lab Results  Component Value Date   CHOL 176 12/27/2022   HDL 49.70 12/27/2022   LDLCALC 104 (H) 12/27/2022   LDLDIRECT 164.2 02/11/2013   TRIG 115.0 12/27/2022   CHOLHDL 4 12/27/2022    Orders: -     Lipid panel; Future -     Hepatic function panel; Future  Hyperglycemia Assessment & Plan: Low carb diet and exercise.  Follow met b and a1c.  Orders: -     Hemoglobin A1c; Future  Anemia, unspecified type Assessment & Plan: Saw GI recently.  Colonoscopy and EGD as outlined. Follow cbc.   Orders: -     Basic metabolic panel; Future -     CBC with Differential/Platelet; Future  Need for influenza vaccination -     Flu Vaccine Trivalent High Dose (Fluad)  Non-Hodgkin's lymphoma, unspecified body region, unspecified non-Hodgkin lymphoma type [facility]) Assessment & Plan: Has been followed by Dr James Ivanoff.  Stable.  Has been following up yearly. Released now.     Hair loss Assessment & Plan: Improved. Follow.    Gastro-esophageal reflux disease without esophagitis Assessment & Plan: Evaluated 7/12 - [name] - continue omeprazole and pepcid.  Colonoscopy and EGD as outlined.    Dysphagia, unspecified type Assessment & Plan: On omeprazole.  Saw GI 08/17/22.  Recommended continue prilosec and pepcid prn. EGD as outlined. Follow.    Other orders -     Furosemide; TAKE 1 TABLET BY MOUTH DAILY AS NEEDED.  Dispense: 90 tablet; Refill: 0 -     Mupirocin; Apply 1 Application topically 2 (two) times daily.  Dispense: 22 g; Refill: 0     Dale [place], MD

## 2023-01-04 ENCOUNTER — Encounter: Payer: Self-pay | Admitting: Internal Medicine

## 2023-01-04 NOTE — Assessment & Plan Note (Signed)
On omeprazole.  Saw GI 08/17/22.  Recommended continue prilosec and pepcid prn. EGD as outlined. Follow.

## 2023-01-04 NOTE — Assessment & Plan Note (Signed)
Evaluated 7/12 - Maria Keller - continue omeprazole and pepcid.  Colonoscopy and EGD as outlined.

## 2023-01-04 NOTE — Assessment & Plan Note (Signed)
On lipitor. Unable to titrate dose due to side effects. Low cholesterol diet and exercise.  Follow lipid panel and liver function tests. Have discussed zetia and possible repatha if problems with oral statins. Continue at current dose. Follow.  Lab Results  Component Value Date   CHOL 176 12/27/2022   HDL 49.70 12/27/2022   LDLCALC 104 (H) 12/27/2022   LDLDIRECT 164.2 02/11/2013   TRIG 115.0 12/27/2022   CHOLHDL 4 12/27/2022

## 2023-01-04 NOTE — Assessment & Plan Note (Signed)
Low carb diet and exercise.  Follow met b and a1c.   

## 2023-01-04 NOTE — Assessment & Plan Note (Signed)
Has been followed by Dr James Ivanoff.  Stable.  Has been following up yearly. Released now.

## 2023-01-04 NOTE — Assessment & Plan Note (Signed)
Improved.  Follow.  

## 2023-01-04 NOTE — Assessment & Plan Note (Signed)
Saw GI recently.  Colonoscopy and EGD as outlined. Follow cbc.

## 2023-01-07 DIAGNOSIS — D225 Melanocytic nevi of trunk: Secondary | ICD-10-CM | POA: Diagnosis not present

## 2023-01-07 DIAGNOSIS — D485 Neoplasm of uncertain behavior of skin: Secondary | ICD-10-CM | POA: Diagnosis not present

## 2023-01-07 DIAGNOSIS — D2262 Melanocytic nevi of left upper limb, including shoulder: Secondary | ICD-10-CM | POA: Diagnosis not present

## 2023-01-07 DIAGNOSIS — C44519 Basal cell carcinoma of skin of other part of trunk: Secondary | ICD-10-CM | POA: Diagnosis not present

## 2023-01-07 DIAGNOSIS — R208 Other disturbances of skin sensation: Secondary | ICD-10-CM | POA: Diagnosis not present

## 2023-01-07 DIAGNOSIS — D2272 Melanocytic nevi of left lower limb, including hip: Secondary | ICD-10-CM | POA: Diagnosis not present

## 2023-01-07 DIAGNOSIS — D2261 Melanocytic nevi of right upper limb, including shoulder: Secondary | ICD-10-CM | POA: Diagnosis not present

## 2023-01-07 DIAGNOSIS — L989 Disorder of the skin and subcutaneous tissue, unspecified: Secondary | ICD-10-CM | POA: Diagnosis not present

## 2023-01-07 DIAGNOSIS — L821 Other seborrheic keratosis: Secondary | ICD-10-CM | POA: Diagnosis not present

## 2023-01-07 DIAGNOSIS — L570 Actinic keratosis: Secondary | ICD-10-CM | POA: Diagnosis not present

## 2023-01-07 DIAGNOSIS — D2372 Other benign neoplasm of skin of left lower limb, including hip: Secondary | ICD-10-CM | POA: Diagnosis not present

## 2023-01-07 DIAGNOSIS — L820 Inflamed seborrheic keratosis: Secondary | ICD-10-CM | POA: Diagnosis not present

## 2023-01-07 DIAGNOSIS — D2271 Melanocytic nevi of right lower limb, including hip: Secondary | ICD-10-CM | POA: Diagnosis not present

## 2023-01-21 DIAGNOSIS — C44519 Basal cell carcinoma of skin of other part of trunk: Secondary | ICD-10-CM | POA: Diagnosis not present

## 2023-01-23 DIAGNOSIS — Z860100 Personal history of colon polyps, unspecified: Secondary | ICD-10-CM | POA: Diagnosis not present

## 2023-01-23 DIAGNOSIS — K219 Gastro-esophageal reflux disease without esophagitis: Secondary | ICD-10-CM | POA: Diagnosis not present

## 2023-02-01 ENCOUNTER — Ambulatory Visit: Payer: Self-pay | Admitting: Internal Medicine

## 2023-02-01 DIAGNOSIS — N390 Urinary tract infection, site not specified: Secondary | ICD-10-CM | POA: Diagnosis not present

## 2023-02-01 DIAGNOSIS — R350 Frequency of micturition: Secondary | ICD-10-CM | POA: Diagnosis not present

## 2023-02-01 NOTE — Telephone Encounter (Signed)
  Chief Complaint: urinary symptoms Symptoms: urinary frequency, burning, urgency and burning. Blood when wiping Frequency: since this afternoon Pertinent Negatives: Patient denies fever, nausea or vomiting, abdominal pain Disposition: [] ED /[x] Urgent Care (no appt availability in office) / [] Appointment(In office/virtual)/ []  [facility]/ [] Home Care/ [] Refused Recommended Disposition /[] [place] [facility]/ []  Follow-up with PCP Additional Notes:  Recommended UC and pt is hesitant due to weakened immune system. Pt requesting to come to the office and drop off a urine specimen. Called [facility] and spoke with Clydie Braun who confirmed pt will need to go to [facility] for an appt. Pt verbalizes understanding and states she will go to UC.  Copied from CRM [id number]. Topic: Clinical - Red Word Triage >> Feb 01, 2023  3:40 PM Elita Quick wrote: [facility] that prompted transfer to Nurse Triage: CUSTOMER CALLED IN STATING SHE MAY HAVE A POSSIBLE UTI. STARTED HAVING FREQUENCY TO URINATE AROUND LUNCH AND IT NOW HAS BLOOD IN IT Reason for Disposition  Side (flank) or lower back pain present  Urinating more frequently than usual (i.e., frequency)  Answer Assessment - Initial Assessment Questions 1. SYMPTOM: "What's the main symptom you're concerned about?" (e.g., frequency, incontinence)     Urinary frequency, urgency, burning and then blood when wiping.   2. ONSET: "When did the  symptoms  start?"     Today around lunchtime.  3. PAIN: "Is there any pain?" If Yes, ask: "How bad is it?" (Scale: 1-10; mild, moderate, severe)     Minimal pain.  4. CAUSE: "What do you think is causing the symptoms?"     Pt states she has a history of UTIs and thinks it is an infection.  5. OTHER SYMPTOMS: "Do you have any other symptoms?" (e.g., blood in urine, fever, flank pain, pain with urination)     Chills, mild R flank pain  Protocols used: Urinary Symptoms-A-AH

## 2023-02-01 NOTE — Telephone Encounter (Signed)
Called patient to follow up with her. She is going to urgent care.

## 2023-02-04 DIAGNOSIS — L570 Actinic keratosis: Secondary | ICD-10-CM | POA: Diagnosis not present

## 2023-03-30 ENCOUNTER — Other Ambulatory Visit: Payer: Self-pay | Admitting: Internal Medicine

## 2023-04-21 DIAGNOSIS — R350 Frequency of micturition: Secondary | ICD-10-CM | POA: Diagnosis not present

## 2023-04-21 DIAGNOSIS — N390 Urinary tract infection, site not specified: Secondary | ICD-10-CM | POA: Diagnosis not present

## 2023-04-25 ENCOUNTER — Telehealth: Payer: Self-pay | Admitting: Internal Medicine

## 2023-04-25 ENCOUNTER — Encounter: Payer: Self-pay | Admitting: Internal Medicine

## 2023-04-25 NOTE — Telephone Encounter (Signed)
 Lm for patient to call and reschedule 05/03/2023 appointment with Dr Arline Asp.Purvis Sheffield please reschedule appt.

## 2023-04-26 ENCOUNTER — Other Ambulatory Visit (INDEPENDENT_AMBULATORY_CARE_PROVIDER_SITE_OTHER)

## 2023-04-26 DIAGNOSIS — R739 Hyperglycemia, unspecified: Secondary | ICD-10-CM | POA: Diagnosis not present

## 2023-04-26 DIAGNOSIS — D649 Anemia, unspecified: Secondary | ICD-10-CM

## 2023-04-26 DIAGNOSIS — E7800 Pure hypercholesterolemia, unspecified: Secondary | ICD-10-CM

## 2023-04-26 LAB — CBC WITH DIFFERENTIAL/PLATELET
Basophils Absolute: 0 10*3/uL (ref 0.0–0.1)
Basophils Relative: 0.6 % (ref 0.0–3.0)
Eosinophils Absolute: 0.1 10*3/uL (ref 0.0–0.7)
Eosinophils Relative: 0.9 % (ref 0.0–5.0)
HCT: 39.6 % (ref 36.0–46.0)
Hemoglobin: 13.2 g/dL (ref 12.0–15.0)
Lymphocytes Relative: 44.3 % (ref 12.0–46.0)
Lymphs Abs: 3.3 10*3/uL (ref 0.7–4.0)
MCHC: 33.3 g/dL (ref 30.0–36.0)
MCV: 86.1 fl (ref 78.0–100.0)
Monocytes Absolute: 0.6 10*3/uL (ref 0.1–1.0)
Monocytes Relative: 7.8 % (ref 3.0–12.0)
Neutro Abs: 3.4 10*3/uL (ref 1.4–7.7)
Neutrophils Relative %: 46.4 % (ref 43.0–77.0)
Platelets: 236 10*3/uL (ref 150.0–400.0)
RBC: 4.6 Mil/uL (ref 3.87–5.11)
RDW: 15.3 % (ref 11.5–15.5)
WBC: 7.4 10*3/uL (ref 4.0–10.5)

## 2023-04-26 LAB — LIPID PANEL
Cholesterol: 172 mg/dL (ref 0–200)
HDL: 51.7 mg/dL (ref >39.00)
LDL Cholesterol: 94 mg/dL (ref 0–99)
NonHDL: 120.38
Total CHOL/HDL Ratio: 3
Triglycerides: 134 mg/dL (ref 0.0–149.0)
VLDL: 26.8 mg/dL (ref 0.0–40.0)

## 2023-04-26 LAB — BASIC METABOLIC PANEL
BUN: 14 mg/dL (ref 6–23)
CO2: 31 meq/L (ref 19–32)
Calcium: 10.1 mg/dL (ref 8.4–10.5)
Chloride: 102 meq/L (ref 96–112)
Creatinine, Ser: 0.8 mg/dL (ref 0.40–1.20)
GFR: 72.59 mL/min (ref >60.00)
Glucose, Bld: 103 mg/dL — ABNORMAL HIGH (ref 70–99)
Potassium: 4.2 meq/L (ref 3.5–5.1)
Sodium: 142 meq/L (ref 135–145)

## 2023-04-26 LAB — HEPATIC FUNCTION PANEL
ALT: 18 U/L (ref 0–35)
AST: 22 U/L (ref 0–37)
Albumin: 4.7 g/dL (ref 3.5–5.2)
Alkaline Phosphatase: 69 U/L (ref 39–117)
Bilirubin, Direct: 0.1 mg/dL (ref 0.0–0.3)
Total Bilirubin: 0.6 mg/dL (ref 0.2–1.2)
Total Protein: 7.5 g/dL (ref 6.0–8.3)

## 2023-04-26 LAB — HEMOGLOBIN A1C: Hgb A1c MFr Bld: 5.7 % (ref 4.6–6.5)

## 2023-04-29 ENCOUNTER — Encounter: Payer: Self-pay | Admitting: Internal Medicine

## 2023-04-29 ENCOUNTER — Ambulatory Visit (INDEPENDENT_AMBULATORY_CARE_PROVIDER_SITE_OTHER): Admitting: Internal Medicine

## 2023-04-29 VITALS — BP 122/70 | HR 92 | Temp 98.3°F | Resp 16 | Ht 67.0 in | Wt 176.0 lb

## 2023-04-29 DIAGNOSIS — E7800 Pure hypercholesterolemia, unspecified: Secondary | ICD-10-CM | POA: Diagnosis not present

## 2023-04-29 DIAGNOSIS — K219 Gastro-esophageal reflux disease without esophagitis: Secondary | ICD-10-CM | POA: Diagnosis not present

## 2023-04-29 DIAGNOSIS — N3000 Acute cystitis without hematuria: Secondary | ICD-10-CM | POA: Diagnosis not present

## 2023-04-29 DIAGNOSIS — G8929 Other chronic pain: Secondary | ICD-10-CM

## 2023-04-29 DIAGNOSIS — M545 Low back pain, unspecified: Secondary | ICD-10-CM | POA: Diagnosis not present

## 2023-04-29 DIAGNOSIS — R102 Pelvic and perineal pain: Secondary | ICD-10-CM | POA: Diagnosis not present

## 2023-04-29 DIAGNOSIS — D649 Anemia, unspecified: Secondary | ICD-10-CM

## 2023-04-29 DIAGNOSIS — R739 Hyperglycemia, unspecified: Secondary | ICD-10-CM

## 2023-04-29 DIAGNOSIS — C8590 Non-Hodgkin lymphoma, unspecified, unspecified site: Secondary | ICD-10-CM

## 2023-04-29 LAB — URINALYSIS, ROUTINE W REFLEX MICROSCOPIC
Bilirubin Urine: NEGATIVE
Hgb urine dipstick: NEGATIVE
Ketones, ur: NEGATIVE
Leukocytes,Ua: NEGATIVE
Nitrite: NEGATIVE
Specific Gravity, Urine: 1.01 (ref 1.000–1.030)
Total Protein, Urine: NEGATIVE
Urine Glucose: NEGATIVE
Urobilinogen, UA: 0.2 (ref 0.0–1.0)
pH: 6 (ref 5.0–8.0)

## 2023-04-29 MED ORDER — METHYLPREDNISOLONE 4 MG PO TBPK: ORAL_TABLET | ORAL | 0 refills | Status: DC

## 2023-04-29 NOTE — Progress Notes (Unsigned)
 Subjective:    Patient ID: Maria Keller, female    DOB: 10-Aug-1950, 73 y.o.   MRN: [id number]  Patient here for  Chief Complaint  Patient presents with   Medical Management of Chronic Issues    HPI Here for a scheduled follow up - follow up regarding hypercholesterolemia and GERD. Saw [name] 02/02/23 - f/u GERD. Recommended continuing protonix. No repeat EGD needed. Recommended f/u colonoscopy in 5 years. She is still having increased acid reflux despite taking protonix. Tried taking 40mg  protonix in the am. Had increased symptoms. Feels the twice a day dosing works better. She is currently taking 20mg  bid. Will have break through symptoms around 1-2 pm. Discussed dose adjustment. She also reports UTI recently. Has been seen at [facility]. Went in January - dysuria. Treated with macrobid. Symptoms persisted. Treated with another round of abx. Last week, noticed urgency, dysuria and hematuria. Went back to [facility] and was treated with 7 days of macrobid.  Comes in today, feeling better. Still with some minimal urgency and noticed suprapubic pressure. No hematuria now. No vaginal symptoms. Some back pain.    Past Medical History:  Diagnosis Date   Anemia    iron deficiency and B12 deficiency   Arthritis    Bronchitis 08/2015   Chronic back pain    followed by Dr Metta Clines   Depression    Diverticulosis    Esophageal stricture    requiring dilatation x 2   Fibromyalgia    Fibromyalgia    Fibromyalgia    Follicular lymphoma (HCC)    Followed by Dr James Ivanoff, s/p chemo and XRT   Follicular lymphoma (HCC)    GERD (gastroesophageal reflux disease)    GERD (gastroesophageal reflux disease)    Hemorrhoid    Hiatal hernia    History of kidney stones    Hypercholesterolemia    IBS (irritable bowel syndrome)    IBS (irritable bowel syndrome)    Nephrolithiasis    followed by Dr Lonna Cobb, s/p stents   PONV (postoperative nausea and vomiting)    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   Shingles outbreak 11/26/2011   Sleep apnea    Past Surgical History:  Procedure Laterality Date   APPENDECTOMY     BIOPSY  11/05/2022   Procedure: BIOPSY;  Surgeon: Regis Bill, MD;  Location: [facility] ENDOSCOPY;  Service: Endoscopy;;   BLADDER SURGERY  1993   BREAST BIOPSY N/A    capsule endoscopy     CHOLECYSTECTOMY  1995   COLONOSCOPY WITH PROPOFOL N/A 06/19/2017   Procedure: COLONOSCOPY WITH PROPOFOL;  Surgeon: Scot Jun, MD;  Location: [facility] ENDOSCOPY;  Service: Endoscopy;  Laterality: N/A;   COLONOSCOPY WITH PROPOFOL N/A 11/05/2022   Procedure: COLONOSCOPY WITH PROPOFOL;  Surgeon: Regis Bill, MD;  Location: [facility] ENDOSCOPY;  Service: Endoscopy;  Laterality: N/A;   ESOPHAGOGASTRODUODENOSCOPY (EGD) WITH PROPOFOL N/A 06/19/2017   Procedure: ESOPHAGOGASTRODUODENOSCOPY (EGD) WITH PROPOFOL;  Surgeon: Scot Jun, MD;  Location: [facility] ENDOSCOPY;  Service: Endoscopy;  Laterality: N/A;   ESOPHAGOGASTRODUODENOSCOPY (EGD) WITH PROPOFOL N/A 11/05/2022   Procedure: ESOPHAGOGASTRODUODENOSCOPY (EGD) WITH PROPOFOL;  Surgeon: Regis Bill, MD;  Location: [facility] ENDOSCOPY;  Service: Endoscopy;  Laterality: N/A;   EYE SURGERY     both cataract remove   FRACTURE SURGERY Left    ankle, plate with 8 screws   history of helicobacter pylori infection     JOINT REPLACEMENT Right    knee  LEFT OOPHORECTOMY     LEG SURGERY Left    ORIF ANKLE FRACTURE  2012   OVARIAN CYST REMOVAL     right   POLYPECTOMY  11/05/2022   Procedure: POLYPECTOMY;  Surgeon: Regis Bill, MD;  Location: [facility] ENDOSCOPY;  Service: Endoscopy;;   RECTOCELE REPAIR N/A    REPLACEMENT TOTAL KNEE Right 03/19/2016   TONSILECTOMY/ADENOIDECTOMY WITH MYRINGOTOMY  1970   TONSILLECTOMY N/A    TOTAL KNEE REVISION Right 11/07/2021   Procedure: TOTAL KNEE REVISION;  Surgeon: Christena Flake, MD;  Location: [facility] ORS;  Service: Orthopedics;  Laterality: Right;   TUBAL  LIGATION  1976   VAGINAL HYSTERECTOMY  1980's   secondary to bleeding   Family History  Problem Relation Age of Onset   Ulcers Mother    Ulcers Father    Breast cancer Other        great aunt and grandfather   Lung cancer Other        uncle   Hypertension Maternal Grandmother    Heart disease Maternal Grandmother        MI 37s   Diabetes Maternal Grandmother    Arthritis Maternal Grandmother    Diabetes Maternal Grandfather    Social History   Socioeconomic History   Marital status: Married    Spouse name: [name]   Number of children: Not on file   Years of education: Not on file   Highest education level: Associate degree: occupational, Scientist, product/process development, or vocational program  Occupational History   Not on file  Tobacco Use   Smoking status: Never   Smokeless tobacco: Never  Vaping Use   Vaping status: Never Used  Substance and Sexual Activity   Alcohol use: No    Alcohol/week: 0.0 standard drinks of alcohol   Drug use: No   Sexual activity: Yes  Other Topics Concern   Not on file  Social History Narrative   Not on file   Social Drivers of Health   Financial Resource Strain: Low Risk  (04/25/2023)   Overall Financial Resource Strain (CARDIA)    Difficulty of Paying Living Expenses: Not very hard  Food Insecurity: No Food Insecurity (04/25/2023)   Hunger Vital Sign    Worried About Running Out of Food in the Last Year: Never true    Ran Out of Food in the Last Year: Never true  Transportation Needs: No Transportation Needs (04/25/2023)   PRAPARE - Administrator, Civil Service (Medical): No    Lack of Transportation (Non-Medical): No  Physical Activity: Inactive (04/25/2023)   Exercise Vital Sign    Days of Exercise per Week: 0 days    Minutes of Exercise per Session: 10 min  Stress: No Stress Concern Present (04/25/2023)   [employer] of Occupational Health - Occupational Stress Questionnaire    Feeling of Stress : Not at all  Social Connections:  Socially Integrated (04/25/2023)   Social Connection and Isolation Panel [NHANES]    Frequency of Communication with Friends and Family: More than three times a week    Frequency of Social Gatherings with Friends and Family: Twice a week    Attends Religious Services: More than 4 times per year    Active Member of [employer] or Organizations: Yes    Attends Engineer, structural: More than 4 times per year    Marital Status: Married     Review of Systems  Constitutional:  Negative for appetite change, fever and unexpected weight change.  HENT:  Negative for congestion and sinus pressure.   Respiratory:  Negative for cough, chest tightness and shortness of breath.   Cardiovascular:  Negative for chest pain, palpitations and leg swelling.  Gastrointestinal:  Negative for abdominal pain, diarrhea, nausea and vomiting.  Genitourinary:        Urinary urgency and suprapubic pressure.   Musculoskeletal:  Negative for joint swelling and myalgias.  Skin:  Negative for color change and rash.  Neurological:  Negative for dizziness and headaches.  Psychiatric/Behavioral:  Negative for agitation and dysphoric mood.        Objective:     BP 122/70   Pulse 92   Temp 98.3 F (36.8 C)   Resp 16   Ht 5\' 7"  (1.702 m)   Wt 176 lb (79.8 kg)   SpO2 97%   BMI 27.57 kg/m  Wt Readings from Last 3 Encounters:  05/03/23 179 lb (81.2 kg)  04/29/23 176 lb (79.8 kg)  01/01/23 173 lb 3.2 oz (78.6 kg)    Physical Exam Vitals reviewed.  Constitutional:      General: She is not in acute distress.    Appearance: Normal appearance.  HENT:     Head: Normocephalic and atraumatic.     Right Ear: External ear normal.     Left Ear: External ear normal.     Mouth/Throat:     Pharynx: No oropharyngeal exudate or posterior oropharyngeal erythema.  Eyes:     General: No scleral icterus.       Right eye: No discharge.        Left eye: No discharge.     Conjunctiva/sclera: Conjunctivae normal.  Neck:      Thyroid: No thyromegaly.  Cardiovascular:     Rate and Rhythm: Normal rate and regular rhythm.  Pulmonary:     Effort: No respiratory distress.     Breath sounds: Normal breath sounds. No wheezing.  Abdominal:     General: Bowel sounds are normal.     Palpations: Abdomen is soft.     Tenderness: There is no abdominal tenderness.  Musculoskeletal:        General: No swelling or tenderness.     Cervical back: Neck supple. No tenderness.     Comments: Back pain - notices more when bending forward and twisting to right   Lymphadenopathy:     Cervical: No cervical adenopathy.  Skin:    Findings: No erythema or rash.  Neurological:     Mental Status: She is alert.  Psychiatric:        Mood and Affect: Mood normal.        Behavior: Behavior normal.         Outpatient Encounter Medications as of 04/29/2023  Medication Sig   methylPREDNISolone (MEDROL DOSEPAK) 4 MG TBPK tablet Medrol dose pak - 6 day taper. Take as directed.   acetaminophen (TYLENOL) 650 MG CR tablet Take 650 mg by mouth every 8 (eight) hours as needed for pain.   aspirin EC 81 MG tablet Take 81 mg by mouth daily.   atorvastatin (LIPITOR) 10 MG tablet TAKE 1 TABLET BY MOUTH EVERY DAY   cetirizine (ZYRTEC) 10 MG tablet Take by mouth.   fluticasone (FLONASE) 50 MCG/ACT nasal spray Place 2 sprays into both nostrils daily.   furosemide (LASIX) 20 MG tablet TAKE 1 TABLET BY MOUTH EVERY DAY AS NEEDED   Multiple Vitamin (MULTIVITAMIN) tablet Take 1 tablet by mouth daily.   mupirocin ointment (BACTROBAN) 2 % Apply 1 Application topically  2 (two) times daily.   omeprazole (PRILOSEC) 20 MG capsule TAKE 1 CAPSULE BY MOUTH TWICE A DAY   senna-docusate (SENOKOT-S) 8.6-50 MG per tablet Take by mouth daily.   [DISCONTINUED] Biotin 82956 MCG TABS Take 1 tablet by mouth daily.   [DISCONTINUED] calcium carbonate (OS-CAL) 1250 (500 Ca) MG chewable tablet Chew 1 tablet by mouth daily.   [DISCONTINUED] vitamin E 180 MG (400 UNITS)  capsule Take 400 Units by mouth daily.   No facility-administered encounter medications on file as of 04/29/2023.     Lab Results  Component Value Date   WBC 7.4 04/26/2023   HGB 13.2 04/26/2023   HCT 39.6 04/26/2023   PLT 236.0 04/26/2023   GLUCOSE 103 (H) 04/26/2023   CHOL 172 04/26/2023   TRIG 134.0 04/26/2023   HDL 51.70 04/26/2023   LDLDIRECT 164.2 02/11/2013   LDLCALC 94 04/26/2023   ALT 18 04/26/2023   AST 22 04/26/2023   NA 142 04/26/2023   K 4.2 04/26/2023   CL 102 04/26/2023   CREATININE 0.80 04/26/2023   BUN 14 04/26/2023   CO2 31 04/26/2023   TSH 1.18 12/27/2022   HGBA1C 5.7 04/26/2023    MM 3D DIAGNOSTIC MAMMOGRAM UNILATERAL RIGHT BREAST Result Date: 12/13/2022 CLINICAL DATA:  Six-month follow-up for a probably benign mass in the right breast, initially assessed on Jun 11, 2022. EXAM: DIGITAL DIAGNOSTIC UNILATERAL RIGHT MAMMOGRAM WITH TOMOSYNTHESIS AND CAD; ULTRASOUND RIGHT BREAST LIMITED TECHNIQUE: Right digital diagnostic mammography and breast tomosynthesis was performed. The images were evaluated with computer-aided detection. ; Targeted ultrasound examination of the right breast was performed COMPARISON:  Previous exam(s). ACR Breast Density Category c: The breasts are heterogeneously dense, which may obscure small masses. FINDINGS: MAMMOGRAM: Diagnostic views of the right breast demonstrate decreased conspicuity of the 4 mm oval circumscribed mass in the upper slightly inner position posterior depth (spot CC image 34/54). No new suspicious mass, calcification, or other findings are identified in the right breast. ULTRASOUND: Targeted right breast ultrasound was performed. At 1 o'clock 4 cm from the nipple, there is an oval predominantly anechoic mass, without internal vascularity. It measures 3 x 2 x 3 mm, previously measuring 5 x 3 x 4 mm in May 2024. This is consistent with a fluctuating benign cyst. IMPRESSION: Decreased size of the mass in the right breast 1 o'clock  position, consistent with a fluctuating benign cyst. No evidence of malignancy. RECOMMENDATION: Return to routine screening mammography is recommended. The patient will be due for screening in May 2025. I have discussed the findings and recommendations with the patient. If applicable, a reminder letter will be sent to the patient regarding the next appointment. BI-RADS CATEGORY  2: Benign. Electronically Signed   By: Jacob Moores M.D.   On: 12/13/2022 10:45   [place] LIMITED ULTRASOUND INCLUDING AXILLA RIGHT BREAST Result Date: 12/13/2022 CLINICAL DATA:  Six-month follow-up for a probably benign mass in the right breast, initially assessed on Jun 11, 2022. EXAM: DIGITAL DIAGNOSTIC UNILATERAL RIGHT MAMMOGRAM WITH TOMOSYNTHESIS AND CAD; ULTRASOUND RIGHT BREAST LIMITED TECHNIQUE: Right digital diagnostic mammography and breast tomosynthesis was performed. The images were evaluated with computer-aided detection. ; Targeted ultrasound examination of the right breast was performed COMPARISON:  Previous exam(s). ACR Breast Density Category c: The breasts are heterogeneously dense, which may obscure small masses. FINDINGS: MAMMOGRAM: Diagnostic views of the right breast demonstrate decreased conspicuity of the 4 mm oval circumscribed mass in the upper slightly inner position posterior depth (spot CC image 34/54). No new  suspicious mass, calcification, or other findings are identified in the right breast. ULTRASOUND: Targeted right breast ultrasound was performed. At 1 o'clock 4 cm from the nipple, there is an oval predominantly anechoic mass, without internal vascularity. It measures 3 x 2 x 3 mm, previously measuring 5 x 3 x 4 mm in May 2024. This is consistent with a fluctuating benign cyst. IMPRESSION: Decreased size of the mass in the right breast 1 o'clock position, consistent with a fluctuating benign cyst. No evidence of malignancy. RECOMMENDATION: Return to routine screening mammography is recommended. The patient  will be due for screening in May 2025. I have discussed the findings and recommendations with the patient. If applicable, a reminder letter will be sent to the patient regarding the next appointment. BI-RADS CATEGORY  2: Benign. Electronically Signed   By: Jacob Moores M.D.   On: 12/13/2022 10:45       Assessment & Plan:  Suprapubic pressure -     Urinalysis, Routine w reflex microscopic -     Urine Culture  Anemia, unspecified type Assessment & Plan: Has seen GI. Colonoscopy and EGD outlined previously. Follow cbc.   Orders: -     CBC with Differential/Platelet; Future -     Basic metabolic panel with GFR; Future  Hyperglycemia Assessment & Plan: Low carb diet and exercise. Follow met b and A1c.   Orders: -     Hemoglobin A1c; Future  Hypercholesterolemia Assessment & Plan: Reviewed recent cholesterol results with her. LDL has improved. She is taking the lipitor and doing relatively well on current dose. Has had problems with increased doses. Follow. No changes today. Diet and exercise. Follow lipid panel.   Orders: -     Hepatic function panel; Future -     Lipid panel; Future  Chronic low back pain (1ry area of Pain) (Bilateral) (R>L) w/o sciatica Assessment & Plan: History of chronic pain. Has seen pain clinic. Back as outlined. Check urine. Appears to be more msk. Exercise. Follow. Notify if persistent.    Gastro-esophageal reflux disease without esophagitis Assessment & Plan: Evaluated by GI. Currently taking 20mg  bid of protonix. Having break through symptoms. Increased dose - 40mg  in am and continue 20mg  pm. Follow.  Call with update.    Non-Hodgkin's lymphoma, unspecified body region, unspecified non-Hodgkin lymphoma type [facility]) Assessment & Plan: Has been followed by Dr James Ivanoff.  Stable.  Has been following up yearly.  Has been released.    Acute cystitis without hematuria Assessment & Plan: Recently treated for UTI. Unclear if culture sent. Obtain  records, both office notes and labs - including urine culture. With some minimal urgency and suprapubic pressure currently. Symptoms have improved. Check urine to confirm no blood (no red blood cells) and confirm no infection. Discussed further evaluation, including possible CT if hematuria persistent.    Other orders -     methylPREDNISolone; Medrol dose pak - 6 day taper. Take as directed.  Dispense: 21 tablet; Refill: 0     Dale Roselle, MD

## 2023-04-30 ENCOUNTER — Encounter: Payer: Self-pay | Admitting: Internal Medicine

## 2023-04-30 LAB — URINE CULTURE
MICRO NUMBER:: 16238783
Result:: NO GROWTH
SPECIMEN QUALITY:: ADEQUATE

## 2023-05-01 ENCOUNTER — Other Ambulatory Visit: Payer: Medicare Other

## 2023-05-02 ENCOUNTER — Ambulatory Visit: Payer: Self-pay

## 2023-05-03 ENCOUNTER — Encounter: Payer: Self-pay | Admitting: Nurse Practitioner

## 2023-05-03 ENCOUNTER — Encounter: Payer: Self-pay | Admitting: Internal Medicine

## 2023-05-03 ENCOUNTER — Ambulatory Visit: Admitting: Nurse Practitioner

## 2023-05-03 ENCOUNTER — Ambulatory Visit: Payer: Medicare Other | Admitting: Internal Medicine

## 2023-05-03 ENCOUNTER — Ambulatory Visit: Payer: Self-pay

## 2023-05-03 VITALS — BP 126/80 | HR 76 | Temp 98.0°F | Ht 67.0 in | Wt 179.0 lb

## 2023-05-03 DIAGNOSIS — N3001 Acute cystitis with hematuria: Secondary | ICD-10-CM

## 2023-05-03 DIAGNOSIS — R300 Dysuria: Secondary | ICD-10-CM

## 2023-05-03 LAB — POCT URINALYSIS DIPSTICK
Bilirubin, UA: NEGATIVE
Glucose, UA: NEGATIVE
Ketones, UA: NEGATIVE
Nitrite, UA: NEGATIVE
Protein, UA: POSITIVE
Spec Grav, UA: 1.02 (ref 1.010–1.025)
Urobilinogen, UA: 0.2 U/dL
pH, UA: 6.5 (ref 5.0–8.0)

## 2023-05-03 LAB — URINALYSIS, ROUTINE W REFLEX MICROSCOPIC
Bilirubin Urine: NEGATIVE
Ketones, ur: NEGATIVE
Nitrite: NEGATIVE
Specific Gravity, Urine: 1.015 (ref 1.000–1.030)
Total Protein, Urine: >=300 — AB
Urine Glucose: NEGATIVE
Urobilinogen, UA: 0.2 (ref 0.0–1.0)
pH: 6 (ref 5.0–8.0)

## 2023-05-03 MED ORDER — FLUCONAZOLE 150 MG PO TABS
150.0000 mg | ORAL_TABLET | Freq: Every day | ORAL | 1 refills | Status: DC
Start: 2023-05-03 — End: 2023-05-09

## 2023-05-03 MED ORDER — SULFAMETHOXAZOLE-TRIMETHOPRIM 800-160 MG PO TABS: 1.0000 | ORAL_TABLET | Freq: Two times a day (BID) | ORAL | 0 refills | Status: DC

## 2023-05-03 NOTE — Telephone Encounter (Signed)
  Chief Complaint: blood with urination Symptoms: blood, pressure, urgency Frequency: this morning Pertinent Negatives: Patient denies fever Disposition: [] ED /[] Urgent Care (no appt availability in office) / [x] Appointment(In office/virtual)/ []  [place] [facility]/ [] Home Care/ [] Refused Recommended Disposition /[] [place] [facility]/ []  Follow-up with PCP Additional Notes: Pt states that she was recently finished medications for UTI and this morning she is now having blood when she wipes with urination, abd pressure, urgency and frequency. Patient states that she just feels icky.   Copied from CRM [id number]. Topic: Clinical - Red Word Triage >> May 03, 2023  8:07 AM Cammy Copa D wrote: Red Word that prompted transfer to Nurse Triage: Blood when wiping, urine Reason for Disposition  Pain or burning with passing urine  Answer Assessment - Initial Assessment Questions 1. COLOR of URINE: "Describe the color of the urine."  (e.g., tea-colored, pink, red, bloody) "Do you have blood clots in your urine?" (e.g., none, pea, grape, small coin)     Cloudy pink -yelloe 2. ONSET: "When did the bleeding start?"      This morning 3. EPISODES: "How many times has there been blood in the urine?" or "How many times today?"     2 4. PAIN with URINATION: "Is there any pain with passing your urine?" If Yes, ask: "How bad is the pain?"  (Scale 1-10; or mild, moderate, severe)    - MILD: Complains slightly about urination hurting.    - MODERATE: Interferes with normal activities.      - SEVERE: Excruciating, unwilling or unable to urinate because of the pain.      mod 5. FEVER: "Do you have a fever?" If Yes, ask: "What is your temperature, how was it measured, and when did it start?"     no 6. ASSOCIATED SYMPTOMS: "Are you passing urine more frequently than usual?"     Frequency, urgency and pressure 7. OTHER SYMPTOMS: "Do you have any other symptoms?" (e.g., back/flank pain, abdomen pain, vomiting)      Lower abd pain  Protocols used: Urine - Blood In-A-AH

## 2023-05-03 NOTE — Assessment & Plan Note (Signed)
 Evaluated by GI. Currently taking 20mg  bid of protonix. Having break through symptoms. Increased dose - 40mg  in am and continue 20mg  pm. Follow.  Call with update.

## 2023-05-03 NOTE — Assessment & Plan Note (Signed)
 Reviewed recent cholesterol results with her. LDL has improved. She is taking the lipitor and doing relatively well on current dose. Has had problems with increased doses. Follow. No changes today. Diet and exercise. Follow lipid panel.

## 2023-05-03 NOTE — Progress Notes (Signed)
 Established Patient Office Visit  Subjective:  Patient ID: Maria Keller, female    DOB: 12/18/1950  Age: 73 y.o. MRN: [id number]  CC:  Chief Complaint  Patient presents with   Acute Visit    Blood in urine, Urgency, Frequency & Abdominal pressure  Discussed the use of a AI scribe software for clinical note transcription with the patient, who gave verbal consent to proceed.   HPI  Maria Keller is a 73 year old female with recurrent urinary tract infections who presents with urinary symptoms and back pain.  She has experienced three UTIs this year, with the most recent episode beginning with symptoms of urgency, frequency, and dysuria, accompanied by hematuria, primarily noticed when wiping.  She went to [facility] urgent care and completed a course of Macrobid for last Saturday, and a urine test on Monday showed no growth or culture. However, symptoms returned early this morning, including significant pain and pressure in the suprapubic area primarily right.  She is currently taking Tylenol Arthritis for pain and has been drinking cranberry juice.  HPI   Past Medical History:  Diagnosis Date   Anemia    iron deficiency and B12 deficiency   Arthritis    Bronchitis 08/2015   Chronic back pain    followed by Dr Metta Clines   Depression    Diverticulosis    Esophageal stricture    requiring dilatation x 2   Fibromyalgia    Fibromyalgia    Fibromyalgia    Follicular lymphoma (HCC)    Followed by Dr James Ivanoff, s/p chemo and XRT   Follicular lymphoma (HCC)    GERD (gastroesophageal reflux disease)    GERD (gastroesophageal reflux disease)    Hemorrhoid    Hiatal hernia    History of kidney stones    Hypercholesterolemia    IBS (irritable bowel syndrome)    IBS (irritable bowel syndrome)    Nephrolithiasis    followed by Dr Lonna Cobb, s/p stents   PONV (postoperative nausea and vomiting)    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    Shingles outbreak 11/26/2011   Sleep apnea     Past Surgical History:  Procedure Laterality Date   APPENDECTOMY     BIOPSY  11/05/2022   Procedure: BIOPSY;  Surgeon: Regis Bill, MD;  Location: [facility] ENDOSCOPY;  Service: Endoscopy;;   BLADDER SURGERY  1993   BREAST BIOPSY N/A    capsule endoscopy     CHOLECYSTECTOMY  1995   COLONOSCOPY WITH PROPOFOL N/A 06/19/2017   Procedure: COLONOSCOPY WITH PROPOFOL;  Surgeon: Scot Jun, MD;  Location: [facility] ENDOSCOPY;  Service: Endoscopy;  Laterality: N/A;   COLONOSCOPY WITH PROPOFOL N/A 11/05/2022   Procedure: COLONOSCOPY WITH PROPOFOL;  Surgeon: Regis Bill, MD;  Location: [facility] ENDOSCOPY;  Service: Endoscopy;  Laterality: N/A;   ESOPHAGOGASTRODUODENOSCOPY (EGD) WITH PROPOFOL N/A 06/19/2017   Procedure: ESOPHAGOGASTRODUODENOSCOPY (EGD) WITH PROPOFOL;  Surgeon: Scot Jun, MD;  Location: [facility] ENDOSCOPY;  Service: Endoscopy;  Laterality: N/A;   ESOPHAGOGASTRODUODENOSCOPY (EGD) WITH PROPOFOL N/A 11/05/2022   Procedure: ESOPHAGOGASTRODUODENOSCOPY (EGD) WITH PROPOFOL;  Surgeon: Regis Bill, MD;  Location: [facility] ENDOSCOPY;  Service: Endoscopy;  Laterality: N/A;   EYE SURGERY     both cataract remove   FRACTURE SURGERY Left    ankle, plate with 8 screws   history of helicobacter pylori infection     JOINT REPLACEMENT Right    knee   LEFT OOPHORECTOMY  LEG SURGERY Left    ORIF ANKLE FRACTURE  2012   OVARIAN CYST REMOVAL     right   POLYPECTOMY  11/05/2022   Procedure: POLYPECTOMY;  Surgeon: Regis Bill, MD;  Location: [facility] ENDOSCOPY;  Service: Endoscopy;;   RECTOCELE REPAIR N/A    REPLACEMENT TOTAL KNEE Right 03/19/2016   TONSILECTOMY/ADENOIDECTOMY WITH MYRINGOTOMY  1970   TONSILLECTOMY N/A    TOTAL KNEE REVISION Right 11/07/2021   Procedure: TOTAL KNEE REVISION;  Surgeon: Christena Flake, MD;  Location: [facility] ORS;  Service: Orthopedics;  Laterality: Right;   TUBAL LIGATION  1976   VAGINAL HYSTERECTOMY   1980's   secondary to bleeding    Family History  Problem Relation Age of Onset   Ulcers Mother    Ulcers Father    Breast cancer Other        great aunt and grandfather   Lung cancer Other        uncle   Hypertension Maternal Grandmother    Heart disease Maternal Grandmother        MI 81s   Diabetes Maternal Grandmother    Arthritis Maternal Grandmother    Diabetes Maternal Grandfather     Social History   Socioeconomic History   Marital status: Married    Spouse name: [name]   Number of children: Not on file   Years of education: Not on file   Highest education level: Associate degree: occupational, Scientist, product/process development, or vocational program  Occupational History   Not on file  Tobacco Use   Smoking status: Never   Smokeless tobacco: Never  Vaping Use   Vaping status: Never Used  Substance and Sexual Activity   Alcohol use: No    Alcohol/week: 0.0 standard drinks of alcohol   Drug use: No   Sexual activity: Yes  Other Topics Concern   Not on file  Social History Narrative   Not on file   Social Drivers of Health   Financial Resource Strain: Low Risk  (04/25/2023)   Overall Financial Resource Strain (CARDIA)    Difficulty of Paying Living Expenses: Not very hard  Food Insecurity: No Food Insecurity (04/25/2023)   Hunger Vital Sign    Worried About Running Out of Food in the Last Year: Never true    Ran Out of Food in the Last Year: Never true  Transportation Needs: No Transportation Needs (04/25/2023)   PRAPARE - Administrator, Civil Service (Medical): No    Lack of Transportation (Non-Medical): No  Physical Activity: Inactive (04/25/2023)   Exercise Vital Sign    Days of Exercise per Week: 0 days    Minutes of Exercise per Session: 10 min  Stress: No Stress Concern Present (04/25/2023)   [employer] of Occupational Health - Occupational Stress Questionnaire    Feeling of Stress : Not at all  Social Connections: Socially Integrated (04/25/2023)    Social Connection and Isolation Panel [NHANES]    Frequency of Communication with Friends and Family: More than three times a week    Frequency of Social Gatherings with Friends and Family: Twice a week    Attends Religious Services: More than 4 times per year    Active Member of [employer] or Organizations: Yes    Attends Banker Meetings: More than 4 times per year    Marital Status: Married  Catering manager Violence: Not At Risk (08/27/2022)   Humiliation, Afraid, Rape, and Kick questionnaire    Fear of Current or Ex-Partner: No  Emotionally Abused: No    Physically Abused: No    Sexually Abused: No     Outpatient Medications Prior to Visit  Medication Sig Dispense Refill   acetaminophen (TYLENOL) 650 MG CR tablet Take 650 mg by mouth every 8 (eight) hours as needed for pain.     aspirin EC 81 MG tablet Take 81 mg by mouth daily.     atorvastatin (LIPITOR) 10 MG tablet TAKE 1 TABLET BY MOUTH EVERY DAY 90 tablet 3   cetirizine (ZYRTEC) 10 MG tablet Take by mouth.     fluticasone (FLONASE) 50 MCG/ACT nasal spray Place 2 sprays into both nostrils daily. 48 g 3   furosemide (LASIX) 20 MG tablet TAKE 1 TABLET BY MOUTH EVERY DAY AS NEEDED 90 tablet 0   methylPREDNISolone (MEDROL DOSEPAK) 4 MG TBPK tablet Medrol dose pak - 6 day taper. Take as directed. 21 tablet 0   Multiple Vitamin (MULTIVITAMIN) tablet Take 1 tablet by mouth daily.     mupirocin ointment (BACTROBAN) 2 % Apply 1 Application topically 2 (two) times daily. 22 g 0   omeprazole (PRILOSEC) 20 MG capsule TAKE 1 CAPSULE BY MOUTH TWICE A DAY 180 capsule 1   senna-docusate (SENOKOT-S) 8.6-50 MG per tablet Take by mouth daily.     No facility-administered medications prior to visit.    Allergies  Allergen Reactions   Oxycontin [Oxycodone Hcl] Itching    Sleep Apnea   Penicillins Anaphylaxis and Itching   Chloral Hydrate Hives    Other Reaction: Not Assessed   Ciprofloxacin Itching   Colace [Docusate Calcium]     Methotrexate Derivatives Other (See Comments)    Questionable    Parafon Forte Dsc [Chlorzoxazone] Itching and Other (See Comments)    Other Reaction: Not Assessed   Penicillin G Hives   Tape Other (See Comments)    adhesive  tape cause redness and skin to peel   Bextra [Valdecoxib] Itching    Other reaction(s): Other (See Comments) Other Reaction: Not Assessed   Oxycodone Itching   Rofecoxib Itching    Other reaction(s): Headache (finding)    ROS Review of Systems Negative unless indicated in HPI.    Objective:    Physical Exam Constitutional:      Appearance: Normal appearance.  Cardiovascular:     Rate and Rhythm: Normal rate and regular rhythm.     Pulses: Normal pulses.     Heart sounds: Normal heart sounds.  Abdominal:     General: Bowel sounds are normal.     Palpations: Abdomen is soft.     Tenderness: There is abdominal tenderness in the suprapubic area. There is no right CVA tenderness or left CVA tenderness.  Musculoskeletal:     Cervical back: Normal range of motion.  Neurological:     General: No focal deficit present.     Mental Status: She is alert. Mental status is at baseline.  Psychiatric:        Mood and Affect: Mood normal.        Behavior: Behavior normal.        Thought Content: Thought content normal.        Judgment: Judgment normal.     BP 126/80   Pulse 76   Temp 98 F (36.7 C)   Ht 5\' 7"  (1.702 m)   Wt 179 lb (81.2 kg)   SpO2 99%   BMI 28.04 kg/m  Wt Readings from Last 3 Encounters:  05/03/23 179 lb (81.2 kg)  04/29/23 176  lb (79.8 kg)  01/01/23 173 lb 3.2 oz (78.6 kg)     Health Maintenance  Topic Date Due   COVID-19 Vaccine (3 - Moderna risk series) 05/15/2023 (Originally 05/18/2019)   Medicare Annual Wellness (AWV)  08/27/2023   MAMMOGRAM  06/10/2024   Colonoscopy  11/05/2027   DTaP/Tdap/Td (2 - Td or Tdap) 10/15/2028   Pneumonia Vaccine 76+ Years old  Completed   INFLUENZA VACCINE  Completed   DEXA SCAN   Completed   Hepatitis C Screening  Completed   Zoster Vaccines- Shingrix  Completed   HPV VACCINES  Aged Out    There are no preventive care reminders to display for this patient.  Lab Results  Component Value Date   TSH 1.18 12/27/2022   Lab Results  Component Value Date   WBC 7.4 04/26/2023   HGB 13.2 04/26/2023   HCT 39.6 04/26/2023   MCV 86.1 04/26/2023   PLT 236.0 04/26/2023   Lab Results  Component Value Date   NA 142 04/26/2023   K 4.2 04/26/2023   CO2 31 04/26/2023   GLUCOSE 103 (H) 04/26/2023   BUN 14 04/26/2023   CREATININE 0.80 04/26/2023   BILITOT 0.6 04/26/2023   ALKPHOS 69 04/26/2023   AST 22 04/26/2023   ALT 18 04/26/2023   PROT 7.5 04/26/2023   ALBUMIN 4.7 04/26/2023   CALCIUM 10.1 04/26/2023   ANIONGAP 6 11/08/2021   EGFR 78 10/16/2021   GFR 72.59 04/26/2023   Lab Results  Component Value Date   CHOL 172 04/26/2023   Lab Results  Component Value Date   HDL 51.70 04/26/2023   Lab Results  Component Value Date   LDLCALC 94 04/26/2023   Lab Results  Component Value Date   TRIG 134.0 04/26/2023   Lab Results  Component Value Date   CHOLHDL 3 04/26/2023   Lab Results  Component Value Date   HGBA1C 5.7 04/26/2023      Assessment & Plan:  Dysuria -     POCT urinalysis dipstick -     Urine Culture -     Urinalysis, Routine w reflex microscopic  Acute cystitis with hematuria Assessment & Plan: Three UTIs this year with symptoms of urgency, frequency, dysuria, and hematuria. Urinalysis positive for moderate leukocytes, hematuria and negative for nitrite. Previously treated with Macrobid.  Allergic to penicillin and Cipro.   -Will treat with Bactrim. Diflucan prescribed to prevent yeast infections requested by patient. -Urine culture pending. -Increase fluid intake. -She will keep [place] posted regarding her symptoms.   Other orders -     Sulfamethoxazole-Trimethoprim; Take 1 tablet by mouth 2 (two) times daily.  Dispense: 10 tablet;  Refill: 0 -     Fluconazole; Take 1 tablet (150 mg total) by mouth daily.  Dispense: 1 tablet; Refill: 1    Follow-up: No follow-ups on file.   Kara Dies, NP

## 2023-05-03 NOTE — Assessment & Plan Note (Signed)
 Low-carb diet and exercise.  Follow met b and A1c.

## 2023-05-03 NOTE — Assessment & Plan Note (Signed)
 History of chronic pain. Has seen pain clinic. Back as outlined. Check urine. Appears to be more msk. Exercise. Follow. Notify if persistent.

## 2023-05-03 NOTE — Assessment & Plan Note (Signed)
 Has been followed by Dr James Ivanoff.  Stable.  Has been following up yearly.  Has been released.

## 2023-05-03 NOTE — Assessment & Plan Note (Signed)
 Has seen GI. Colonoscopy and EGD outlined previously. Follow cbc.

## 2023-05-03 NOTE — Telephone Encounter (Signed)
 Pt seeing Charanpreet this morning

## 2023-05-03 NOTE — Assessment & Plan Note (Signed)
 Recently treated for UTI. Unclear if culture sent. Obtain records, both office notes and labs - including urine culture. With some minimal urgency and suprapubic pressure currently. Symptoms have improved. Check urine to confirm no blood (no red blood cells) and confirm no infection. Discussed further evaluation, including possible CT if hematuria persistent.

## 2023-05-05 ENCOUNTER — Encounter: Payer: Self-pay | Admitting: Nurse Practitioner

## 2023-05-05 DIAGNOSIS — R300 Dysuria: Secondary | ICD-10-CM | POA: Insufficient documentation

## 2023-05-05 LAB — URINE CULTURE
MICRO NUMBER:: 16261292
SPECIMEN QUALITY:: ADEQUATE

## 2023-05-05 NOTE — Progress Notes (Signed)
 Please inform pt the result and how she is doing? The results of the urine culture shows that you have UTI.  The antibiotic that was prescribed will treat the infection, please complete the entire dose of the medication even if you start feeling.

## 2023-05-05 NOTE — Assessment & Plan Note (Signed)
 Three UTIs this year with symptoms of urgency, frequency, dysuria, and hematuria. Urinalysis positive for moderate leukocytes, hematuria and negative for nitrite. Previously treated with Macrobid.  Allergic to penicillin and Cipro.   -Will treat with Bactrim. Diflucan prescribed to prevent yeast infections requested by patient. -Urine culture pending. -Increase fluid intake. -She will keep [place] posted regarding her symptoms.

## 2023-05-08 ENCOUNTER — Encounter: Payer: Self-pay | Admitting: Internal Medicine

## 2023-05-08 NOTE — Telephone Encounter (Signed)
 Called and confirmed ok. Pt actually tested positive for covid on Sunday. Message would not go through. Confirmed no fever, sob, chest tightness, etc. Scheduled for virtual visit to discuss.

## 2023-05-09 ENCOUNTER — Telehealth (INDEPENDENT_AMBULATORY_CARE_PROVIDER_SITE_OTHER): Admitting: Internal Medicine

## 2023-05-09 ENCOUNTER — Telehealth: Payer: Self-pay

## 2023-05-09 DIAGNOSIS — N390 Urinary tract infection, site not specified: Secondary | ICD-10-CM | POA: Diagnosis not present

## 2023-05-09 DIAGNOSIS — U071 COVID-19: Secondary | ICD-10-CM | POA: Diagnosis not present

## 2023-05-09 MED ORDER — PREDNISONE 10 MG PO TABS: ORAL_TABLET | ORAL | 0 refills | Status: DC

## 2023-05-09 MED ORDER — BENZONATATE 100 MG PO CAPS: 100.0000 mg | ORAL_CAPSULE | Freq: Three times a day (TID) | ORAL | 0 refills | Status: DC | PRN

## 2023-05-09 NOTE — Progress Notes (Signed)
 Patient ID: Maria Keller, female   DOB: 12-01-1950, 73 y.o.   MRN: [id number]   Virtual Visit via video Note  I connected with Maria Keller by a video enabled telemedicine application and verified that I am speaking with the correct person using two identifiers. Location patient: home Location provider: work  Persons participating in the virtual visit: patient, provider  The limitations, risks, security and privacy concerns of performing an evaluation and management service by video and the availability of in person appointments have been discussed. It has also been discussed with the patient that there may be a patient responsible charge related to this service. The patient expressed understanding and agreed to proceed.   Reason for visit: work in appt.   HPI: Work in appt with covid. Reports noticing increased congestion and coughing, sore throat and headache. Tested positive for covid. Reports symptoms started Saturday 05/04/23 - scratchy throat and headache. Low grade fever - 100 yesterday. Reports persistent cough. Headache. Increased nasal congestion. Increased drainage. Throat is better. Taste and smell affected. No vomiting or diarrhea. She is eating some and trying to stay hydrated. Cough - productive. Has taken zycam. Also robitussin DM and mucinex DM and tylenol. Also has used afrin nasal spray for a couple of days. No sob.    ROS: See pertinent positives and negatives per HPI.  Past Medical History:  Diagnosis Date   Anemia    iron deficiency and B12 deficiency   Arthritis    Bronchitis 08/2015   Chronic back pain    followed by Dr Metta Clines   Depression    Diverticulosis    Esophageal stricture    requiring dilatation x 2   Fibromyalgia    Fibromyalgia    Fibromyalgia    Follicular lymphoma (HCC)    Followed by Dr James Ivanoff, s/p chemo and XRT   Follicular lymphoma (HCC)    GERD (gastroesophageal reflux disease)    GERD (gastroesophageal reflux disease)    Hemorrhoid     Hiatal hernia    History of kidney stones    Hypercholesterolemia    IBS (irritable bowel syndrome)    IBS (irritable bowel syndrome)    Nephrolithiasis    followed by Dr Lonna Cobb, s/p stents   PONV (postoperative nausea and vomiting)    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   Shingles outbreak 11/26/2011   Sleep apnea     Past Surgical History:  Procedure Laterality Date   APPENDECTOMY     BIOPSY  11/05/2022   Procedure: BIOPSY;  Surgeon: Regis Bill, MD;  Location: [facility] ENDOSCOPY;  Service: Endoscopy;;   BLADDER SURGERY  1993   BREAST BIOPSY N/A    capsule endoscopy     CHOLECYSTECTOMY  1995   COLONOSCOPY WITH PROPOFOL N/A 06/19/2017   Procedure: COLONOSCOPY WITH PROPOFOL;  Surgeon: Scot Jun, MD;  Location[facility] ENDOSCOPY;  Service: Endoscopy;  Laterality: N/A;   COLONOSCOPY WITH PROPOFOL N/A 11/05/2022   Procedure: COLONOSCOPY WITH PROPOFOL;  Surgeon: Regis Bill, MD;  Location: [facility] ENDOSCOPY;  Service: Endoscopy;  Laterality: N/A;   ESOPHAGOGASTRODUODENOSCOPY (EGD) WITH PROPOFOL N/A 06/19/2017   Procedure: ESOPHAGOGASTRODUODENOSCOPY (EGD) WITH PROPOFOL;  Surgeon: Scot Jun, MD;  Location: [facility] ENDOSCOPY;  Service: Endoscopy;  Laterality: N/A;   ESOPHAGOGASTRODUODENOSCOPY (EGD) WITH PROPOFOL N/A 11/05/2022   Procedure: ESOPHAGOGASTRODUODENOSCOPY (EGD) WITH PROPOFOL;  Surgeon: Regis Bill, MD;  Location: [facility] ENDOSCOPY;  Service: Endoscopy;  Laterality: N/A;   EYE  SURGERY     both cataract remove   FRACTURE SURGERY Left    ankle, plate with 8 screws   history of helicobacter pylori infection     JOINT REPLACEMENT Right    knee   LEFT OOPHORECTOMY     LEG SURGERY Left    ORIF ANKLE FRACTURE  2012   OVARIAN CYST REMOVAL     right   POLYPECTOMY  11/05/2022   Procedure: POLYPECTOMY;  Surgeon: Regis Bill, MD;  Location: [facility] ENDOSCOPY;  Service: Endoscopy;;   RECTOCELE REPAIR N/A     REPLACEMENT TOTAL KNEE Right 03/19/2016   TONSILECTOMY/ADENOIDECTOMY WITH MYRINGOTOMY  1970   TONSILLECTOMY N/A    TOTAL KNEE REVISION Right 11/07/2021   Procedure: TOTAL KNEE REVISION;  Surgeon: Christena Flake, MD;  Location: [facility] ORS;  Service: Orthopedics;  Laterality: Right;   TUBAL LIGATION  1976   VAGINAL HYSTERECTOMY  1980's   secondary to bleeding    Family History  Problem Relation Age of Onset   Ulcers Mother    Ulcers Father    Breast cancer Other        great aunt and grandfather   Lung cancer Other        uncle   Hypertension Maternal Grandmother    Heart disease Maternal Grandmother        MI 54s   Diabetes Maternal Grandmother    Arthritis Maternal Grandmother    Diabetes Maternal Grandfather     SOCIAL HX: reviewed.    Current Outpatient Medications:    benzonatate (TESSALON PERLES) 100 MG capsule, Take 1 capsule (100 mg total) by mouth 3 (three) times daily as needed for cough., Disp: 21 capsule, Rfl: 0   predniSONE (DELTASONE) 10 MG tablet, Take 4 tablets x 1 day and then decrease by 1/2 tablet per day until down zero mg., Disp: 18 tablet, Rfl: 0   acetaminophen (TYLENOL) 650 MG CR tablet, Take 650 mg by mouth every 8 (eight) hours as needed for pain., Disp: , Rfl:    aspirin EC 81 MG tablet, Take 81 mg by mouth daily., Disp: , Rfl:    atorvastatin (LIPITOR) 10 MG tablet, TAKE 1 TABLET BY MOUTH EVERY DAY, Disp: 90 tablet, Rfl: 3   cetirizine (ZYRTEC) 10 MG tablet, Take by mouth., Disp: , Rfl:    fluticasone (FLONASE) 50 MCG/ACT nasal spray, Place 2 sprays into both nostrils daily., Disp: 48 g, Rfl: 3   furosemide (LASIX) 20 MG tablet, TAKE 1 TABLET BY MOUTH EVERY DAY AS NEEDED, Disp: 90 tablet, Rfl: 0   Multiple Vitamin (MULTIVITAMIN) tablet, Take 1 tablet by mouth daily., Disp: , Rfl:    mupirocin ointment (BACTROBAN) 2 %, Apply 1 Application topically 2 (two) times daily., Disp: 22 g, Rfl: 0   omeprazole (PRILOSEC) 20 MG capsule, TAKE 1 CAPSULE BY MOUTH TWICE  A DAY, Disp: 180 capsule, Rfl: 1   senna-docusate (SENOKOT-S) 8.6-50 MG per tablet, Take by mouth daily., Disp: , Rfl:   EXAM:  GENERAL: alert, oriented, appears well and in no acute distress  HEENT: atraumatic, conjunttiva clear, no obvious abnormalities on inspection of external nose and ears  NECK: normal movements of the head and neck  LUNGS: on inspection no signs of respiratory distress, breathing rate appears normal, no obvious gross SOB, gasping or wheezing. Increased cough with forced expiration.   CV: no obvious cyanosis  PSYCH/NEURO: pleasant and cooperative, no obvious depression or anxiety, speech and thought processing grossly intact  ASSESSMENT AND PLAN:  Discussed the following assessment and plan:  Problem List Items Addressed This Visit     Frequent UTI   Recently evaluated again and diagnosed with UTI. Culture positive. Have been unable to get records for urgent care, but was treated for positive UTI. Recurring issues. Discussed referral to urology.       Positive self-administered antigen test for COVID-19 - Primary   With congestion and cough as outlined. Tested positive for covid. Discussed quarantine guidelines. Discussed - need to be fever free for 24 hours without taking medication to keep temperature down. Discussed masking. Hold on antiviral medications. No sob. Day 6 of symptoms. Treat symptoms, saline nasal spray and steroid nasal spray as directed. Robitussin DM. Prednisone taper as directed. Tessalon perles as needed for cough and congestion. Rest. Stay hydrated. Follow.  Call with update.        Return if symptoms worsen or fail to improve.   I discussed the assessment and treatment plan with the patient. The patient was provided an opportunity to ask questions and all were answered. The patient agreed with the plan and demonstrated an understanding of the instructions.   The patient was advised to call back or seek an in-person evaluation if the  symptoms worsen or if the condition fails to improve as anticipated.    Dale Hugoton, MD

## 2023-05-09 NOTE — Telephone Encounter (Signed)
 Urology referral placed per Dr Lorin Picket.

## 2023-05-12 ENCOUNTER — Encounter: Payer: Self-pay | Admitting: Internal Medicine

## 2023-05-12 DIAGNOSIS — U071 COVID-19: Secondary | ICD-10-CM | POA: Insufficient documentation

## 2023-05-12 DIAGNOSIS — N390 Urinary tract infection, site not specified: Secondary | ICD-10-CM | POA: Insufficient documentation

## 2023-05-12 NOTE — Assessment & Plan Note (Signed)
 With congestion and cough as outlined. Tested positive for covid. Discussed quarantine guidelines. Discussed - need to be fever free for 24 hours without taking medication to keep temperature down. Discussed masking. Hold on antiviral medications. No sob. Day 6 of symptoms. Treat symptoms, saline nasal spray and steroid nasal spray as directed. Robitussin DM. Prednisone taper as directed. Tessalon perles as needed for cough and congestion. Rest. Stay hydrated. Follow.  Call with update.

## 2023-05-12 NOTE — Assessment & Plan Note (Signed)
 Recently evaluated again and diagnosed with UTI. Culture positive. Have been unable to get records for urgent care, but was treated for positive UTI. Recurring issues. Discussed referral to urology.

## 2023-05-15 ENCOUNTER — Encounter: Payer: Self-pay | Admitting: Internal Medicine

## 2023-05-22 ENCOUNTER — Other Ambulatory Visit: Payer: Self-pay | Admitting: Internal Medicine

## 2023-05-22 DIAGNOSIS — Z1231 Encounter for screening mammogram for malignant neoplasm of breast: Secondary | ICD-10-CM

## 2023-05-30 ENCOUNTER — Ambulatory Visit (INDEPENDENT_AMBULATORY_CARE_PROVIDER_SITE_OTHER): Admitting: Urology

## 2023-05-30 VITALS — BP 130/71 | HR 89 | Ht 67.0 in | Wt 178.4 lb

## 2023-05-30 DIAGNOSIS — N393 Stress incontinence (female) (male): Secondary | ICD-10-CM | POA: Diagnosis not present

## 2023-05-30 DIAGNOSIS — N390 Urinary tract infection, site not specified: Secondary | ICD-10-CM | POA: Diagnosis not present

## 2023-05-30 LAB — MICROSCOPIC EXAMINATION: Epithelial Cells (non renal): >10 /HPF — AB (ref 0–10)

## 2023-05-30 LAB — URINALYSIS, COMPLETE
Bilirubin, UA: NEGATIVE
Glucose, UA: NEGATIVE
Ketones, UA: NEGATIVE
Leukocytes,UA: NEGATIVE
Nitrite, UA: NEGATIVE
Protein,UA: NEGATIVE
RBC, UA: NEGATIVE
Specific Gravity, UA: 1.025 (ref 1.005–1.030)
Urobilinogen, Ur: 1 mg/dL (ref 0.2–1.0)
pH, UA: 5.5 (ref 5.0–7.5)

## 2023-05-30 LAB — BLADDER SCAN AMB NON-IMAGING: Scan Result: 34

## 2023-05-30 MED ORDER — ESTRADIOL 0.1 MG/GM VA CREA: TOPICAL_CREAM | VAGINAL | 12 refills | Status: AC

## 2023-05-30 NOTE — Progress Notes (Signed)
 Maria Keller,acting as a scribe for Maria Gimenez, MD.,have documented all relevant documentation on the behalf of Maria Gimenez, MD,as directed by  Maria Gimenez, MD while in the presence of Maria Gimenez, MD.  05/30/23 2:10 PM   Maria Keller 08/19/1950 [id number]  Referring provider: Dellar Fenton, MD [address] [place],  [place] [address]  Chief Complaint  Patient presents with   Recurrent UTI    HPI: 73 year old female with a personal history of recurrent UTIs presents today for further evaluation.   She reports experiencing lower abdominal pressure and urinary leakage.   She had a presumed UTI in January 2024, treated at [facility], where she received antibiotics without a urine check.  She had a positive urinalysis on May 03, 2022, which grew pan-sensitive E. coli. Interestingly, a urinalysis four days prior was negative.   She reports a history of a kidney stone in 2010 or 2011, which led to hospitalization for sepsis.   An abdominal ultrasound in 2017 showed normal kidneys with no stones.  She describes experiencing back and flank pain in January, suspecting she may have passed a stone.   She has a history of stress incontinence and underwent an MMK Bertell Broach surgery and another procedure for prolapse, which may have worsened her incontinence.  She is not currently on estrogen but was previously.  Results for orders placed or performed in visit on 05/30/23  Microscopic Examination   Urine  Result Value Ref Range   WBC, UA 0-5 0 - 5 /hpf   RBC, Urine 0-2 0 - 2 /hpf   Epithelial Cells (non renal) >10 (A) 0 - 10 /hpf   Mucus, UA Present (A) Not Estab.   Bacteria, UA Few None seen/Few  Urinalysis, Complete  Result Value Ref Range   Specific Gravity, UA 1.025 1.005 - 1.030   pH, UA 5.5 5.0 - 7.5   Color, UA Yellow Yellow   Appearance Ur Clear Clear   Leukocytes,UA Negative Negative   Protein,UA Negative Negative/Trace    Glucose, UA Negative Negative   Ketones, UA Negative Negative   RBC, UA Negative Negative   Bilirubin, UA Negative Negative   Urobilinogen, Ur 1.0 0.2 - 1.0 mg/dL   Nitrite, UA Negative Negative   Microscopic Examination See below:   Bladder Scan (Post Void Residual) in office  Result Value Ref Range   Scan Result 34 ml      PMH: Past Medical History:  Diagnosis Date   Anemia    iron deficiency and B12 deficiency   Arthritis    Bronchitis 08/2015   Chronic back pain    followed by Dr Pollyann Brinks   Depression    Diverticulosis    Esophageal stricture    requiring dilatation x 2   Fibromyalgia    Fibromyalgia    Fibromyalgia    Follicular lymphoma (HCC)    Followed by Dr DeCastro, s/p chemo and XRT   Follicular lymphoma (HCC)    GERD (gastroesophageal reflux disease)    GERD (gastroesophageal reflux disease)    Hemorrhoid    Hiatal hernia    History of kidney stones    Hypercholesterolemia    IBS (irritable bowel syndrome)    IBS (irritable bowel syndrome)    Nephrolithiasis    followed by Dr Cherylene Corrente, s/p stents   PONV (postoperative nausea and vomiting)    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   Shingles outbreak 11/26/2011  Sleep apnea     Surgical History: Past Surgical History:  Procedure Laterality Date   APPENDECTOMY     BIOPSY  11/05/2022   Procedure: BIOPSY;  Surgeon: Maria Darling, MD;  Location: [facility] ENDOSCOPY;  Service: Endoscopy;;   BLADDER SURGERY  1993   BREAST BIOPSY N/A    capsule endoscopy     CHOLECYSTECTOMY  1995   COLONOSCOPY WITH PROPOFOL  N/A 06/19/2017   Procedure: COLONOSCOPY WITH PROPOFOL ;  Surgeon: Maria Click, MD;  Location: [facility] ENDOSCOPY;  Service: Endoscopy;  Laterality: N/A;   COLONOSCOPY WITH PROPOFOL  N/A 11/05/2022   Procedure: COLONOSCOPY WITH PROPOFOL ;  Surgeon: Maria Darling, MD;  Location: [facility] ENDOSCOPY;  Service: Endoscopy;  Laterality: N/A;    ESOPHAGOGASTRODUODENOSCOPY (EGD) WITH PROPOFOL  N/A 06/19/2017   Procedure: ESOPHAGOGASTRODUODENOSCOPY (EGD) WITH PROPOFOL ;  Surgeon: Maria Click, MD;  Location: [facility] ENDOSCOPY;  Service: Endoscopy;  Laterality: N/A;   ESOPHAGOGASTRODUODENOSCOPY (EGD) WITH PROPOFOL  N/A 11/05/2022   Procedure: ESOPHAGOGASTRODUODENOSCOPY (EGD) WITH PROPOFOL ;  Surgeon: Maria Darling, MD;  Location: [facility] ENDOSCOPY;  Service: Endoscopy;  Laterality: N/A;   EYE SURGERY     both cataract remove   FRACTURE SURGERY Left    ankle, plate with 8 screws   history of helicobacter pylori infection     JOINT REPLACEMENT Right    knee   LEFT OOPHORECTOMY     LEG SURGERY Left    ORIF ANKLE FRACTURE  2012   OVARIAN CYST REMOVAL     right   POLYPECTOMY  11/05/2022   Procedure: POLYPECTOMY;  Surgeon: Maria Darling, MD;  Location: [facility] ENDOSCOPY;  Service: Endoscopy;;   RECTOCELE REPAIR N/A    REPLACEMENT TOTAL KNEE Right 03/19/2016   TONSILECTOMY/ADENOIDECTOMY WITH MYRINGOTOMY  1970   TONSILLECTOMY N/A    TOTAL KNEE REVISION Right 11/07/2021   Procedure: TOTAL KNEE REVISION;  Surgeon: Maria Hahn, MD;  Location: [facility] ORS;  Service: Orthopedics;  Laterality: Right;   TUBAL LIGATION  1976   VAGINAL HYSTERECTOMY  1980's   secondary to bleeding    Home Medications:  Allergies as of 05/30/2023       Reactions   Oxycontin  [oxycodone  Hcl] Itching   Sleep Apnea   Penicillins Anaphylaxis, Itching   Chloral Hydrate Hives   Other Reaction: Not Assessed   Ciprofloxacin Itching   Colace [docusate Calcium ]    Methotrexate Derivatives Other (See Comments)   Questionable   Parafon Forte Dsc [chlorzoxazone] Itching, Other (See Comments)   Other Reaction: Not Assessed   Penicillin G Hives   Tape Other (See Comments)   adhesive  tape cause redness and skin to peel   Bextra [valdecoxib] Itching   Other reaction(s): Other (See Comments) Other Reaction: Not Assessed   Oxycodone  Itching   Rofecoxib Itching    Other reaction(s): Headache (finding)        Medication List        Accurate as of May 30, 2023  2:10 PM. If you have any questions, ask your nurse or doctor.          STOP taking these medications    benzonatate  100 MG capsule Commonly known as: Tessalon  Perles   predniSONE  10 MG tablet Commonly known as: DELTASONE        TAKE these medications    acetaminophen  650 MG CR tablet Commonly known as: TYLENOL  Take 650 mg by mouth every 8 (eight) hours as needed for pain.   aspirin EC 81 MG tablet Take 81 mg by mouth daily.  atorvastatin  10 MG tablet Commonly known as: LIPITOR TAKE 1 TABLET BY MOUTH EVERY DAY   cetirizine 10 MG tablet Commonly known as: ZYRTEC Take by mouth.   estradiol  0.1 MG/GM vaginal cream Commonly known as: ESTRACE  Estrogen Cream Instruction Discard applicator Apply pea sized amount to tip of finger to urethra before bed. Wash hands well after application. Use Monday, Wednesday and Friday   fluticasone  50 MCG/ACT nasal spray Commonly known as: FLONASE  Place 2 sprays into both nostrils daily.   furosemide  20 MG tablet Commonly known as: LASIX  TAKE 1 TABLET BY MOUTH EVERY DAY AS NEEDED   multivitamin tablet Take 1 tablet by mouth daily.   mupirocin  ointment 2 % Commonly known as: BACTROBAN  Apply 1 Application topically 2 (two) times daily.   omeprazole  20 MG capsule Commonly known as: PRILOSEC TAKE 1 CAPSULE BY MOUTH TWICE A DAY   senna-docusate 8.6-50 MG tablet Commonly known as: Senokot-S Take by mouth daily.        Allergies:  Allergies  Allergen Reactions   Oxycontin  [Oxycodone  Hcl] Itching    Sleep Apnea   Penicillins Anaphylaxis and Itching   Chloral Hydrate Hives    Other Reaction: Not Assessed   Ciprofloxacin Itching   Colace [Docusate Calcium ]    Methotrexate Derivatives Other (See Comments)    Questionable    Parafon Forte Dsc [Chlorzoxazone] Itching and Other (See Comments)    Other Reaction: Not  Assessed   Penicillin G Hives   Tape Other (See Comments)    adhesive  tape cause redness and skin to peel   Bextra [Valdecoxib] Itching    Other reaction(s): Other (See Comments) Other Reaction: Not Assessed   Oxycodone  Itching   Rofecoxib Itching    Other reaction(s): Headache (finding)    Family History: Family History  Problem Relation Age of Onset   Ulcers Mother    Ulcers Father    Breast cancer Other        great aunt and grandfather   Lung cancer Other        uncle   Hypertension Maternal Grandmother    Heart disease Maternal Grandmother        MI 29s   Diabetes Maternal Grandmother    Arthritis Maternal Grandmother    Diabetes Maternal Grandfather     Social History:  reports that she has never smoked. She has never used smokeless tobacco. She reports that she does not drink alcohol and does not use drugs.   Physical Exam: BP 130/71   Pulse 89   Ht 5\' 7"  (1.702 m)   Wt 178 lb 6 oz (80.9 kg)   BMI 27.94 kg/m   Constitutional:  Alert and oriented, No acute distress. HEENT: [place] AT, moist mucus membranes.  Trachea midline, no masses. Neurologic: Grossly intact, no focal deficits, moving all 4 extremities. Psychiatric: Normal mood and affect.  Pertinent Imaging: EXAM: ABDOMEN ULTRASOUND COMPLETE   COMPARISON:  CT Abdomen and Pelvis 03/17/2013.   FINDINGS: Gallbladder: Surgically absent.   Common bile duct: Diameter: 7 mm, within normal limits in the post cholecystectomy state.   Liver: Unchanged small anterior right lobe simple appearing cyst measuring 14 mm. Similar small simple appearing left lobe cyst, 9 mm. No intrahepatic biliary ductal dilatation. No other discrete liver lesion. Abnormally increased background liver echogenicity (image 43 and image 68).   IVC: No abnormality visualized.   Pancreas: Incompletely visualized due to overlying bowel gas, visualized portions within normal limits.   Spleen: Size and appearance within normal  limits.   Right Kidney: Length: 11.1 cm. Echogenicity within normal limits. No mass or hydronephrosis visualized.   Left Kidney: Length: 10.1 cm. Stable small 11 mm exophytic benign midpole cyst. Echogenicity within normal limits. No mass or hydronephrosis visualized.   Abdominal aorta: No aneurysm visualized.   Other findings: None.   IMPRESSION: 1. Fatty liver disease. 2. Otherwise negative abdomen ultrasound.     Electronically Signed   By: Marlise Simpers M.D.   On: 05/13/2015 10:35  This was personally reviewed and I agree with the radiologic interpretation.  Assessment & Plan:    1. Recurrent UTIs - She has a history of recurrent UTIs, with the most recent positive culture for E. coli in March 2024. Previous infections were treated with antibiotics, but there is concern about incomplete treatment due to insufficient duration of antibiotic therapy. - Recommend daily prophylactic measures including cranberry tablets, probiotics, and D-mannose. Discussed the use of a combination product for ease of use. - Consideration of topical estrogen cream for UTI prevention due to post-menopausal atrophy of estrogen-sensitive tissues. - Prescribe estrogen cream to be applied to the urethral area three times a week, with an option to start daily for two weeks to accelerate tissue response. - Encourage her to contact the clinic if symptoms of UTI recur for prompt evaluation and treatment.  Recurrent UTI Prevention Strategies  Stay well hydrated. Get a moderate amount of exercise. Eat a diet rich in fruit and vegetables. Start a bowel regimen to manage your constipation. Your goal is to have consistent, formed bowel movements that are easy for you to pass. You may use either of the over-the-counter supplements Benefiber or Miralax to help with this. I recommend that you try Benefiber first and move on to Miralax if this is not helping you enough. You may adjust the recommended dose of Miralax (one  capful daily) to achieve this goal. Start taking an over-the-counter cranberry supplement for urinary tract health. Take this once or twice daily on an empty stomach, e.g. right before bed. Start taking an over-the-counter d-mannose supplement. Take this daily per packaging instructions. Start taking an over-the-counter probiotic containing the bacterial species called Lactobacillus. Take this daily. Start vaginal estrogen cream. Apply a pea-sized amount around the opening of the urethra every day for 2 weeks, then three times weekly forever.   2. Stress Urinary Incontinence - She reports stress incontinence, with a history of previous surgical interventions including MMK and possible prolapse repair. - Discussed the importance of Kegel exercises for pelvic floor strengthening.  - If symptoms persist and are bothersome, consider referral to a urologist or urogynecologist for further evaluation and potential surgical intervention.  Return in about 3 months (around 08/29/2023) for reassessment of UTI prevention strategy. Return sooner if symptoms worsen.  I have reviewed the above documentation for accuracy and completeness, and I agree with the above.   Maria Gimenez, MD   [facility] [address], [address] [place], [place] [address] [phone]

## 2023-06-06 ENCOUNTER — Ambulatory Visit: Admitting: Urology

## 2023-06-13 ENCOUNTER — Ambulatory Visit
Admission: RE | Admit: 2023-06-13 | Discharge: 2023-06-13 | Disposition: A | Discharge status: Home / Self Care | Source: Ambulatory Visit | Attending: Internal Medicine | Admitting: Internal Medicine

## 2023-06-13 DIAGNOSIS — Z1231 Encounter for screening mammogram for malignant neoplasm of breast: Secondary | ICD-10-CM

## 2023-07-02 ENCOUNTER — Other Ambulatory Visit: Payer: Self-pay | Admitting: Internal Medicine

## 2023-07-24 DIAGNOSIS — D2271 Melanocytic nevi of right lower limb, including hip: Secondary | ICD-10-CM | POA: Diagnosis not present

## 2023-07-24 DIAGNOSIS — D2261 Melanocytic nevi of right upper limb, including shoulder: Secondary | ICD-10-CM | POA: Diagnosis not present

## 2023-07-24 DIAGNOSIS — D485 Neoplasm of uncertain behavior of skin: Secondary | ICD-10-CM | POA: Diagnosis not present

## 2023-07-24 DIAGNOSIS — L570 Actinic keratosis: Secondary | ICD-10-CM | POA: Diagnosis not present

## 2023-07-24 DIAGNOSIS — L820 Inflamed seborrheic keratosis: Secondary | ICD-10-CM | POA: Diagnosis not present

## 2023-07-24 DIAGNOSIS — D2262 Melanocytic nevi of left upper limb, including shoulder: Secondary | ICD-10-CM | POA: Diagnosis not present

## 2023-07-24 DIAGNOSIS — D2272 Melanocytic nevi of left lower limb, including hip: Secondary | ICD-10-CM | POA: Diagnosis not present

## 2023-07-24 DIAGNOSIS — Z85828 Personal history of other malignant neoplasm of skin: Secondary | ICD-10-CM | POA: Diagnosis not present

## 2023-07-30 ENCOUNTER — Other Ambulatory Visit (INDEPENDENT_AMBULATORY_CARE_PROVIDER_SITE_OTHER)

## 2023-07-30 DIAGNOSIS — D649 Anemia, unspecified: Secondary | ICD-10-CM

## 2023-07-30 DIAGNOSIS — R739 Hyperglycemia, unspecified: Secondary | ICD-10-CM | POA: Diagnosis not present

## 2023-07-30 DIAGNOSIS — E7800 Pure hypercholesterolemia, unspecified: Secondary | ICD-10-CM | POA: Diagnosis not present

## 2023-07-30 LAB — CBC WITH DIFFERENTIAL/PLATELET
Basophils Absolute: 0.1 10*3/uL (ref 0.0–0.1)
Basophils Relative: 0.7 % (ref 0.0–3.0)
Eosinophils Absolute: 0.1 10*3/uL (ref 0.0–0.7)
Eosinophils Relative: 1 % (ref 0.0–5.0)
HCT: 38.7 % (ref 36.0–46.0)
Hemoglobin: 12.8 g/dL (ref 12.0–15.0)
Lymphocytes Relative: 40.2 % (ref 12.0–46.0)
Lymphs Abs: 3.1 10*3/uL (ref 0.7–4.0)
MCHC: 33.1 g/dL (ref 30.0–36.0)
MCV: 85.3 fl (ref 78.0–100.0)
Monocytes Absolute: 0.6 10*3/uL (ref 0.1–1.0)
Monocytes Relative: 7.3 % (ref 3.0–12.0)
Neutro Abs: 3.9 10*3/uL (ref 1.4–7.7)
Neutrophils Relative %: 50.8 % (ref 43.0–77.0)
Platelets: 217 10*3/uL (ref 150.0–400.0)
RBC: 4.54 Mil/uL (ref 3.87–5.11)
RDW: 15.5 % (ref 11.5–15.5)
WBC: 7.6 10*3/uL (ref 4.0–10.5)

## 2023-07-30 LAB — HEPATIC FUNCTION PANEL
ALT: 18 U/L (ref 0–35)
AST: 21 U/L (ref 0–37)
Albumin: 4.5 g/dL (ref 3.5–5.2)
Alkaline Phosphatase: 70 U/L (ref 39–117)
Bilirubin, Direct: 0.1 mg/dL (ref 0.0–0.3)
Total Bilirubin: 0.5 mg/dL (ref 0.2–1.2)
Total Protein: 6.8 g/dL (ref 6.0–8.3)

## 2023-07-30 LAB — BASIC METABOLIC PANEL WITH GFR
BUN: 12 mg/dL (ref 6–23)
CO2: 34 meq/L — ABNORMAL HIGH (ref 19–32)
Calcium: 9.7 mg/dL (ref 8.4–10.5)
Chloride: 101 meq/L (ref 96–112)
Creatinine, Ser: 0.75 mg/dL (ref 0.40–1.20)
GFR: 78.29 mL/min (ref >60.00)
Glucose, Bld: 89 mg/dL (ref 70–99)
Potassium: 4.9 meq/L (ref 3.5–5.1)
Sodium: 141 meq/L (ref 135–145)

## 2023-07-30 LAB — LIPID PANEL
Cholesterol: 180 mg/dL (ref 0–200)
HDL: 50.6 mg/dL (ref >39.00)
LDL Cholesterol: 99 mg/dL (ref 0–99)
NonHDL: 129.3
Total CHOL/HDL Ratio: 4
Triglycerides: 154 mg/dL — ABNORMAL HIGH (ref 0.0–149.0)
VLDL: 30.8 mg/dL (ref 0.0–40.0)

## 2023-07-30 LAB — HEMOGLOBIN A1C: Hgb A1c MFr Bld: 5.6 % (ref 4.6–6.5)

## 2023-07-31 ENCOUNTER — Ambulatory Visit: Payer: Self-pay | Admitting: Internal Medicine

## 2023-08-01 ENCOUNTER — Encounter: Payer: Self-pay | Admitting: Internal Medicine

## 2023-08-01 ENCOUNTER — Ambulatory Visit (INDEPENDENT_AMBULATORY_CARE_PROVIDER_SITE_OTHER): Admitting: Internal Medicine

## 2023-08-01 VITALS — BP 112/70 | HR 87 | Temp 97.9°F | Resp 16 | Ht 67.0 in | Wt 176.0 lb

## 2023-08-01 DIAGNOSIS — K219 Gastro-esophageal reflux disease without esophagitis: Secondary | ICD-10-CM

## 2023-08-01 DIAGNOSIS — N390 Urinary tract infection, site not specified: Secondary | ICD-10-CM

## 2023-08-01 DIAGNOSIS — C8590 Non-Hodgkin lymphoma, unspecified, unspecified site: Secondary | ICD-10-CM | POA: Diagnosis not present

## 2023-08-01 DIAGNOSIS — N393 Stress incontinence (female) (male): Secondary | ICD-10-CM

## 2023-08-01 DIAGNOSIS — Z0000 Encounter for general adult medical examination without abnormal findings: Secondary | ICD-10-CM | POA: Diagnosis not present

## 2023-08-01 DIAGNOSIS — D649 Anemia, unspecified: Secondary | ICD-10-CM

## 2023-08-01 DIAGNOSIS — E7800 Pure hypercholesterolemia, unspecified: Secondary | ICD-10-CM | POA: Diagnosis not present

## 2023-08-01 DIAGNOSIS — R739 Hyperglycemia, unspecified: Secondary | ICD-10-CM

## 2023-08-01 MED ORDER — ATORVASTATIN CALCIUM 10 MG PO TABS: 10.0000 mg | ORAL_TABLET | Freq: Every day | ORAL | 3 refills | Status: AC

## 2023-08-01 MED ORDER — OMEPRAZOLE 20 MG PO CPDR: 20.0000 mg | DELAYED_RELEASE_CAPSULE | Freq: Two times a day (BID) | ORAL | 1 refills | Status: DC

## 2023-08-01 MED ORDER — FUROSEMIDE 20 MG PO TABS: 20.0000 mg | ORAL_TABLET | Freq: Every day | ORAL | 0 refills | Status: DC | PRN

## 2023-08-01 NOTE — Progress Notes (Signed)
 Subjective:    Patient ID: Maria Keller, female    DOB: [birth year], 73 y.o.   MRN: [id number]  Patient here for  Chief Complaint  Patient presents with   Medical Management of Chronic Issues    1 year follow up    HPI With past history of hypercholesterolemia and GERD, comes in today to follow up on these issues as well as for a complete physical exam.  Saw Luke Barefoot 02/02/23 - f/u GERD. Recommended continuing protonix . No repeat EGD needed. Recommended f/u colonoscopy in 5 years. Saw Dr Penne 05/30/23 - recurring UTIs. Recommended to stay well hydrated. Also recommended a bowel regimen to keep bowels regular. Take otc cranberry supplement, d-mannose and probiotic. Also - estrogen cream. Has not had recurring UTI, but is having increased stress incontinence.  Worsening. Discussed PFPT and f/u with urology. S/p removal of a skin lesion - left breast. Waiting for biopsy results. She has noticed raised lesions  top lesion - itches and she feels is aggravated by her glasses. Itches. Two palpable nodules - below - under the skin. These two do not itch and are not painful. Breathing stable. No bowel issues.    Past Medical History:  Diagnosis Date   Anemia    iron deficiency and B12 deficiency   Arthritis    Bronchitis 08/2015   Chronic back pain    followed by Dr Dannial   Depression    Diverticulosis    Esophageal stricture    requiring dilatation x 2   Fibromyalgia    Fibromyalgia    Fibromyalgia    Follicular lymphoma (HCC)    Followed by Dr DeCastro, s/p chemo and XRT   Follicular lymphoma (HCC)    GERD (gastroesophageal reflux disease)    GERD (gastroesophageal reflux disease)    Hemorrhoid    Hiatal hernia    History of kidney stones    Hypercholesterolemia    IBS (irritable bowel syndrome)    IBS (irritable bowel syndrome)    Nephrolithiasis    followed by Dr Twylla, s/p stents   PONV (postoperative nausea and vomiting)    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   Shingles outbreak 11/26/2011   Sleep apnea    Past Surgical History:  Procedure Laterality Date   APPENDECTOMY     BIOPSY  11/05/2022   Procedure: BIOPSY;  Surgeon: Maryruth Ole DASEN, MD;  Location: [facility] ENDOSCOPY;  Service: Endoscopy;;   BLADDER SURGERY  1993   BREAST BIOPSY N/A    capsule endoscopy     CHOLECYSTECTOMY  1995   COLONOSCOPY WITH PROPOFOL  N/A 06/19/2017   Procedure: COLONOSCOPY WITH PROPOFOL ;  Surgeon: Viktoria Lamar DASEN, MD;  Location: [facility] ENDOSCOPY;  Service: Endoscopy;  Laterality: N/A;   COLONOSCOPY WITH PROPOFOL  N/A 11/05/2022   Procedure: COLONOSCOPY WITH PROPOFOL ;  Surgeon: Maryruth Ole DASEN, MD;  Location: [facility] ENDOSCOPY;  Service: Endoscopy;  Laterality: N/A;   ESOPHAGOGASTRODUODENOSCOPY (EGD) WITH PROPOFOL  N/A 06/19/2017   Procedure: ESOPHAGOGASTRODUODENOSCOPY (EGD) WITH PROPOFOL ;  Surgeon: Viktoria Lamar DASEN, MD;  Location: [facility] ENDOSCOPY;  Service: Endoscopy;  Laterality: N/A;   ESOPHAGOGASTRODUODENOSCOPY (EGD) WITH PROPOFOL  N/A 11/05/2022   Procedure: ESOPHAGOGASTRODUODENOSCOPY (EGD) WITH PROPOFOL ;  Surgeon: Maryruth Ole DASEN, MD;  Location: [facility] ENDOSCOPY;  Service: Endoscopy;  Laterality: N/A;   EYE SURGERY     both cataract remove   FRACTURE SURGERY Left    ankle, plate with 8 screws   history of helicobacter pylori infection  JOINT REPLACEMENT Right    knee   LEFT OOPHORECTOMY     LEG SURGERY Left    ORIF ANKLE FRACTURE  2012   OVARIAN CYST REMOVAL     right   POLYPECTOMY  11/05/2022   Procedure: POLYPECTOMY;  Surgeon: Maryruth Ole DASEN, MD;  Location: [facility] ENDOSCOPY;  Service: Endoscopy;;   RECTOCELE REPAIR N/A    REPLACEMENT TOTAL KNEE Right 03/19/2016   TONSILECTOMY/ADENOIDECTOMY WITH MYRINGOTOMY  1970   TONSILLECTOMY N/A    TOTAL KNEE REVISION Right 11/07/2021   Procedure: TOTAL KNEE REVISION;  Surgeon: Edie Norleen PARAS, MD;  Location: [facility] ORS;  Service: Orthopedics;  Laterality: Right;   TUBAL  LIGATION  1976   VAGINAL HYSTERECTOMY  1980's   secondary to bleeding   Family History  Problem Relation Age of Onset   Ulcers Mother    Ulcers Father    Breast cancer Other        great aunt and grandfather   Lung cancer Other        uncle   Hypertension Maternal Grandmother    Heart disease Maternal Grandmother        MI 56s   Diabetes Maternal Grandmother    Arthritis Maternal Grandmother    Diabetes Maternal Grandfather    Social History   Socioeconomic History   Marital status: Married    Spouse name: [name]   Number of children: Not on file   Years of education: Not on file   Highest education level: Associate degree: occupational, Scientist, product/process development, or vocational program  Occupational History   Not on file  Tobacco Use   Smoking status: Never   Smokeless tobacco: Never  Vaping Use   Vaping status: Never Used  Substance and Sexual Activity   Alcohol use: No    Alcohol/week: 0.0 standard drinks of alcohol   Drug use: No   Sexual activity: Yes  Other Topics Concern   Not on file  Social History Narrative   Not on file   Social Drivers of Health   Financial Resource Strain: Low Risk  (07/29/2023)   Overall Financial Resource Strain (CARDIA)    Difficulty of Paying Living Expenses: Not very hard  Food Insecurity: No Food Insecurity (07/29/2023)   Hunger Vital Sign    Worried About Running Out of Food in the Last Year: Never true    Ran Out of Food in the Last Year: Never true  Transportation Needs: No Transportation Needs (07/29/2023)   PRAPARE - Administrator, Civil Service (Medical): No    Lack of Transportation (Non-Medical): No  Physical Activity: Inactive (07/29/2023)   Exercise Vital Sign    Days of Exercise per Week: 0 days    Minutes of Exercise per Session: Not on file  Stress: No Stress Concern Present (07/29/2023)   [employer] of Occupational Health - Occupational Stress Questionnaire    Feeling of Stress: Not at all  Social  Connections: Socially Integrated (07/29/2023)   Social Connection and Isolation Panel    Frequency of Communication with Friends and Family: More than three times a week    Frequency of Social Gatherings with Friends and Family: Twice a week    Attends Religious Services: More than 4 times per year    Active Member of [employer] or Organizations: Yes    Attends Engineer, structural: More than 4 times per year    Marital Status: Married     Review of Systems  Constitutional:  Negative for appetite  change and unexpected weight change.  HENT:  Negative for congestion and sinus pressure.   Respiratory:  Negative for cough, chest tightness and shortness of breath.   Cardiovascular:  Negative for chest pain, palpitations and leg swelling.  Gastrointestinal:  Negative for abdominal pain, diarrhea, nausea and vomiting.  Genitourinary:  Negative for difficulty urinating and dysuria.       Some stress incontinence as outlined.   Musculoskeletal:  Negative for joint swelling and myalgias.  Skin:  Negative for color change and rash.  Neurological:  Negative for dizziness and headaches.  Psychiatric/Behavioral:  Negative for agitation and dysphoric mood.        Objective:     BP 112/70   Pulse 87   Temp 97.9 F (36.6 C)   Resp 16   Ht 5' 7 (1.702 m)   Wt 176 lb (79.8 kg)   SpO2 98%   BMI 27.57 kg/m  Wt Readings from Last 3 Encounters:  08/01/23 176 lb (79.8 kg)  05/30/23 178 lb 6 oz (80.9 kg)  05/03/23 179 lb (81.2 kg)    Physical Exam Vitals reviewed.  Constitutional:      General: She is not in acute distress.    Appearance: Normal appearance. She is well-developed.  HENT:     Head: Normocephalic and atraumatic.     Right Ear: External ear normal.     Left Ear: External ear normal.     Mouth/Throat:     Pharynx: No oropharyngeal exudate or posterior oropharyngeal erythema.   Eyes:     General: No scleral icterus.       Right eye: No discharge.        Left eye: No  discharge.     Conjunctiva/sclera: Conjunctivae normal.   Neck:     Thyroid : No thyromegaly.   Cardiovascular:     Rate and Rhythm: Normal rate and regular rhythm.  Pulmonary:     Effort: No tachypnea, accessory muscle usage or respiratory distress.     Breath sounds: Normal breath sounds. No decreased breath sounds or wheezing.  Chest:  Breasts:    Right: No inverted nipple, mass, nipple discharge or tenderness (no axillary adenopathy).     Left: No inverted nipple, mass, nipple discharge or tenderness (no axilarry adenopathy).  Abdominal:     General: Bowel sounds are normal.     Palpations: Abdomen is soft.     Tenderness: There is no abdominal tenderness.   Musculoskeletal:        General: No swelling or tenderness.     Cervical back: Neck supple.  Lymphadenopathy:     Cervical: No cervical adenopathy.   Skin:    Findings: No rash.     Comments: Small circular area - erythema - posterior ear/ over mastoid. Two small palpable nodules - posterior ear. Non tender to palpation. No neck lymphadenopathy.    Neurological:     Mental Status: She is alert and oriented to person, place, and time.   Psychiatric:        Mood and Affect: Mood normal.        Behavior: Behavior normal.         Outpatient Encounter Medications as of 08/01/2023  Medication Sig   acetaminophen  (TYLENOL ) 650 MG CR tablet Take 650 mg by mouth every 8 (eight) hours as needed for pain.   aspirin EC 81 MG tablet Take 81 mg by mouth daily.   atorvastatin  (LIPITOR) 10 MG tablet Take 1 tablet (10 mg total) by  mouth daily.   cetirizine (ZYRTEC) 10 MG tablet Take by mouth.   D-MANNOSE PO Take 1-2 capsules by mouth daily at 6 (six) AM.   estradiol  (ESTRACE ) 0.1 MG/GM vaginal cream Estrogen Cream Instruction Discard applicator Apply pea sized amount to tip of finger to urethra before bed. Wash hands well after application. Use Monday, Wednesday and Friday   furosemide  (LASIX ) 20 MG tablet Take 1 tablet (20 mg  total) by mouth daily as needed.   Multiple Vitamin (MULTIVITAMIN) tablet Take 1 tablet by mouth daily.   mupirocin  ointment (BACTROBAN ) 2 % Apply 1 Application topically 2 (two) times daily.   omeprazole  (PRILOSEC) 20 MG capsule Take 1 capsule (20 mg total) by mouth 2 (two) times daily.   senna-docusate (SENOKOT-S) 8.6-50 MG per tablet Take by mouth daily.   triamcinolone  (NASACORT  ALLERGY 24HR) 55 MCG/ACT AERO nasal inhaler Place 1 spray into the nose daily.   [DISCONTINUED] atorvastatin  (LIPITOR) 10 MG tablet TAKE 1 TABLET BY MOUTH EVERY DAY   [DISCONTINUED] fluticasone  (FLONASE ) 50 MCG/ACT nasal spray Place 2 sprays into both nostrils daily.   [DISCONTINUED] furosemide  (LASIX ) 20 MG tablet TAKE 1 TABLET BY MOUTH EVERY DAY AS NEEDED   [DISCONTINUED] omeprazole  (PRILOSEC) 20 MG capsule TAKE 1 CAPSULE BY MOUTH TWICE A DAY   No facility-administered encounter medications on file as of 08/01/2023.     Lab Results  Component Value Date   WBC 7.6 07/30/2023   HGB 12.8 07/30/2023   HCT 38.7 07/30/2023   PLT 217.0 07/30/2023   GLUCOSE 89 07/30/2023   CHOL 180 07/30/2023   TRIG 154.0 (H) 07/30/2023   HDL 50.60 07/30/2023   LDLDIRECT 164.2 02/11/2013   LDLCALC 99 07/30/2023   ALT 18 07/30/2023   AST 21 07/30/2023   NA 141 07/30/2023   K 4.9 07/30/2023   CL 101 07/30/2023   CREATININE 0.75 07/30/2023   BUN 12 07/30/2023   CO2 34 (H) 07/30/2023   TSH 1.18 12/27/2022   HGBA1C 5.6 07/30/2023    MM 3D SCREENING MAMMOGRAM BILATERAL BREAST Result Date: 06/14/2023 CLINICAL DATA:  Screening. EXAM: DIGITAL SCREENING BILATERAL MAMMOGRAM WITH TOMOSYNTHESIS AND CAD TECHNIQUE: Bilateral screening digital craniocaudal and mediolateral oblique mammograms were obtained. Bilateral screening digital breast tomosynthesis was performed. The images were evaluated with computer-aided detection. COMPARISON:  Previous exam(s). ACR Breast Density Category c: The breasts are heterogeneously dense, which may  obscure small masses. FINDINGS: There are no findings suspicious for malignancy. IMPRESSION: No mammographic evidence of malignancy. A result letter of this screening mammogram will be mailed directly to the patient. RECOMMENDATION: Screening mammogram in one year. (Code:SM-B-01Y) BI-RADS CATEGORY  1: Negative. Electronically Signed   By: Reyes Phi M.D.   On: 06/14/2023 12:48       Assessment & Plan:  Health care maintenance Assessment & Plan: Physical today 08/01/23.  S/p hysterectomy.  Mammogram 11/22/23 - ok.   Colonosocpy 03/2013 - melanosis and internal hemorrhoids. Colonoscopy 10/2022 - pathology tubular adenoma and hyperplastic polyps.    Anemia, unspecified type Assessment & Plan: Has seen GI. Colonoscopy and EGD outlined previously. Follow cbc.   Orders: -     Basic metabolic panel with GFR; Future  Hyperglycemia Assessment & Plan: Low carb diet and exercise. Follow met b and A1c.   Orders: -     Hemoglobin A1c; Future  Hypercholesterolemia Assessment & Plan: Taking lipitor. Continue low cholesterol diet and exercise. Follow lipid panel. Discussed recent labs.   Lab Results  Component Value Date  CHOL 180 07/30/2023   HDL 50.60 07/30/2023   LDLCALC 99 07/30/2023   LDLDIRECT 164.2 02/11/2013   TRIG 154.0 (H) 07/30/2023   CHOLHDL 4 07/30/2023     Orders: -     Hepatic function panel; Future -     Lipid panel; Future -     TSH; Future  Frequent UTI Assessment & Plan: Saw Dr Penne 05/30/23 - recurring UTIs. Recommended to stay well hydrated. Also recommended a bowel regimen to keep bowels regular. Take otc cranberry supplement, d-mannose and probiotic. Also - estrogen cream. Has not had recurring UTI, but is having stress incontinence. Discussed PFPT. Request f/u with urology - request to see Dr Twylla.   Orders: -     Ambulatory referral to Urology  Gastro-esophageal reflux disease without esophagitis Assessment & Plan: Evaluated by GI. Increased dose - 40mg   in am and continue 20mg  pm. Discussed trial of 20mg  bid. Follow symptoms.    Lymphoma, unspecified body region, unspecified lymphoma type [facility] Assessment & Plan: Evaluated by Dr DeCastro 10/2022. Released. Follow cbc.    Stress incontinence Assessment & Plan: Has seen urology for frequent UTIs as outlined. With increased stress incontinence. Discussed PFPT. Request referral back to urology as outlined.   Orders: -     Ambulatory referral to Urology  Other orders -     Atorvastatin  Calcium ; Take 1 tablet (10 mg total) by mouth daily.  Dispense: 90 tablet; Refill: 3 -     Furosemide ; Take 1 tablet (20 mg total) by mouth daily as needed.  Dispense: 90 tablet; Refill: 0 -     Omeprazole ; Take 1 capsule (20 mg total) by mouth 2 (two) times daily.  Dispense: 180 capsule; Refill: 1     Allena Hamilton, MD

## 2023-08-01 NOTE — Assessment & Plan Note (Signed)
 Physical today 08/01/23.  S/p hysterectomy.  Mammogram 11/22/23 - ok.   Colonosocpy 03/2013 - melanosis and internal hemorrhoids. Colonoscopy 10/2022 - pathology tubular adenoma and hyperplastic polyps.

## 2023-08-04 ENCOUNTER — Encounter: Payer: Self-pay | Admitting: Internal Medicine

## 2023-08-04 DIAGNOSIS — N393 Stress incontinence (female) (male): Secondary | ICD-10-CM | POA: Insufficient documentation

## 2023-08-04 NOTE — Assessment & Plan Note (Addendum)
 Has seen GI. Colonoscopy and EGD outlined previously. Follow cbc.

## 2023-08-04 NOTE — Assessment & Plan Note (Signed)
 Evaluated by GI. Increased dose - 40mg  in am and continue 20mg  pm. Discussed trial of 20mg  bid. Follow symptoms.

## 2023-08-04 NOTE — Assessment & Plan Note (Addendum)
 Saw Dr Penne 05/30/23 - recurring UTIs. Recommended to stay well hydrated. Also recommended a bowel regimen to keep bowels regular. Take otc cranberry supplement, d-mannose and probiotic. Also - estrogen cream. Has not had recurring UTI, but is having stress incontinence. Discussed PFPT. Request f/u with urology - request to see Dr Twylla.

## 2023-08-04 NOTE — Assessment & Plan Note (Signed)
 Evaluated by Dr DeCastro 10/2022. Released. Follow cbc.

## 2023-08-04 NOTE — Assessment & Plan Note (Signed)
 Taking lipitor. Continue low cholesterol diet and exercise. Follow lipid panel. Discussed recent labs.   Lab Results  Component Value Date   CHOL 180 07/30/2023   HDL 50.60 07/30/2023   LDLCALC 99 07/30/2023   LDLDIRECT 164.2 02/11/2013   TRIG 154.0 (H) 07/30/2023   CHOLHDL 4 07/30/2023

## 2023-08-04 NOTE — Assessment & Plan Note (Signed)
 Has seen urology for frequent UTIs as outlined. With increased stress incontinence. Discussed PFPT. Request referral back to urology as outlined.

## 2023-08-04 NOTE — Assessment & Plan Note (Signed)
 Low-carb diet and exercise.  Follow met b and A1c.

## 2023-08-17 ENCOUNTER — Encounter: Payer: Self-pay | Admitting: Internal Medicine

## 2023-08-19 NOTE — Telephone Encounter (Signed)
 I am glad the 20 bid is working. Please confirm if she needs a prescription. If persistent nodules, then I would recommend ENT evaluation - to determine etiology.

## 2023-09-23 ENCOUNTER — Ambulatory Visit (INDEPENDENT_AMBULATORY_CARE_PROVIDER_SITE_OTHER): Admitting: Urology

## 2023-09-23 ENCOUNTER — Encounter: Payer: Self-pay | Admitting: Urology

## 2023-09-23 VITALS — BP 139/80 | HR 109 | Ht 67.0 in | Wt 179.0 lb

## 2023-09-23 DIAGNOSIS — N3946 Mixed incontinence: Secondary | ICD-10-CM | POA: Diagnosis not present

## 2023-09-23 DIAGNOSIS — N393 Stress incontinence (female) (male): Secondary | ICD-10-CM

## 2023-09-23 LAB — URINALYSIS, COMPLETE
Bilirubin, UA: NEGATIVE
Glucose, UA: NEGATIVE
Ketones, UA: NEGATIVE
Leukocytes,UA: NEGATIVE
Nitrite, UA: NEGATIVE
Protein,UA: NEGATIVE
RBC, UA: NEGATIVE
Specific Gravity, UA: 1.015 (ref 1.005–1.030)
Urobilinogen, Ur: 0.2 mg/dL (ref 0.2–1.0)
pH, UA: 6 (ref 5.0–7.5)

## 2023-09-23 LAB — MICROSCOPIC EXAMINATION: Epithelial Cells (non renal): >10 /HPF — AB (ref 0–10)

## 2023-09-23 NOTE — Progress Notes (Signed)
 09/23/2023 2:34 PM   Maria Keller 1950-11-22 [id number]  Referring provider: Glendia Shad, MD [address] [place],  [place] [address]  Chief Complaint  Patient presents with   Urinary Incontinence    HPI: Dr. Penne: Recurrent urinary tract infections.  Previous Layman Haff surgery for incontinence.  Behavioral therapies discussed including estrogen cream.  Today Patient reports urge incontinence.  She leaks with coughing sneezing sometimes bending lifting.  Primary symptom is leaking when she goes from a sitting to standing position.  She wears 6 heavy pads a day damp to moderately wet.  Voids every 2 hours and 2-3 times at night.  Flow is fair  Has had open bladder surgery and another bladder suspension.  Has had a hysterectomy.  Takes estrogen cream 3 times a week and cranberry  No neurologic issues  On pelvic examination patient had excellent support of bladder neck.  Vaginal length approximately 6 cm.  No cystocele or rectocele.  Negative cough test with modest cough     PMH: Past Medical History:  Diagnosis Date   Anemia    iron deficiency and B12 deficiency   Arthritis    Bronchitis 08/2015   Chronic back pain    followed by Dr Dannial   Depression    Diverticulosis    Esophageal stricture    requiring dilatation x 2   Fibromyalgia    Fibromyalgia    Fibromyalgia    Follicular lymphoma (HCC)    Followed by Dr DeCastro, s/p chemo and XRT   Follicular lymphoma (HCC)    GERD (gastroesophageal reflux disease)    GERD (gastroesophageal reflux disease)    Hemorrhoid    Hiatal hernia    History of kidney stones    Hypercholesterolemia    IBS (irritable bowel syndrome)    IBS (irritable bowel syndrome)    Nephrolithiasis    followed by Dr Twylla, s/p stents   PONV (postoperative nausea and vomiting)    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   Shingles outbreak 11/26/2011    Sleep apnea     Surgical History: Past Surgical History:  Procedure Laterality Date   APPENDECTOMY     BIOPSY  11/05/2022   Procedure: BIOPSY;  Surgeon: Maryruth Ole DASEN, MD;  Location: [facility] ENDOSCOPY;  Service: Endoscopy;;   BLADDER SURGERY  1993   BREAST BIOPSY N/A    capsule endoscopy     CHOLECYSTECTOMY  1995   COLONOSCOPY WITH PROPOFOL  N/A 06/19/2017   Procedure: COLONOSCOPY WITH PROPOFOL ;  Surgeon: Viktoria Lamar DASEN, MD;  Location: [facility] ENDOSCOPY;  Service: Endoscopy;  Laterality: N/A;   COLONOSCOPY WITH PROPOFOL  N/A 11/05/2022   Procedure: COLONOSCOPY WITH PROPOFOL ;  Surgeon: Maryruth Ole DASEN, MD;  Location: [facility] ENDOSCOPY;  Service: Endoscopy;  Laterality: N/A;   ESOPHAGOGASTRODUODENOSCOPY (EGD) WITH PROPOFOL  N/A 06/19/2017   Procedure: ESOPHAGOGASTRODUODENOSCOPY (EGD) WITH PROPOFOL ;  Surgeon: Viktoria Lamar DASEN, MD;  Location: [facility] ENDOSCOPY;  Service: Endoscopy;  Laterality: N/A;   ESOPHAGOGASTRODUODENOSCOPY (EGD) WITH PROPOFOL  N/A 11/05/2022   Procedure: ESOPHAGOGASTRODUODENOSCOPY (EGD) WITH PROPOFOL ;  Surgeon: Maryruth Ole DASEN, MD;  Location: [facility] ENDOSCOPY;  Service: Endoscopy;  Laterality: N/A;   EYE SURGERY     both cataract remove   FRACTURE SURGERY Left    ankle, plate with 8 screws   history of helicobacter pylori infection     JOINT REPLACEMENT Right    knee   LEFT OOPHORECTOMY     LEG SURGERY Left  ORIF ANKLE FRACTURE  2012   OVARIAN CYST REMOVAL     right   POLYPECTOMY  11/05/2022   Procedure: POLYPECTOMY;  Surgeon: Maryruth Ole DASEN, MD;  Location: [facility] ENDOSCOPY;  Service: Endoscopy;;   RECTOCELE REPAIR N/A    REPLACEMENT TOTAL KNEE Right 03/19/2016   TONSILECTOMY/ADENOIDECTOMY WITH MYRINGOTOMY  1970   TONSILLECTOMY N/A    TOTAL KNEE REVISION Right 11/07/2021   Procedure: TOTAL KNEE REVISION;  Surgeon: Edie Norleen PARAS, MD;  Location: [facility] ORS;  Service: Orthopedics;  Laterality: Right;   TUBAL LIGATION  1976   VAGINAL HYSTERECTOMY  1980's    secondary to bleeding    Home Medications:  Allergies as of 09/23/2023       Reactions   Oxycontin  [oxycodone  Hcl] Itching   Sleep Apnea   Penicillins Anaphylaxis, Itching   Chloral Hydrate Hives   Other Reaction: Not Assessed   Ciprofloxacin Itching   Colace [docusate Calcium ]    Methotrexate Derivatives Other (See Comments)   Questionable   Parafon Forte Dsc [chlorzoxazone] Itching, Other (See Comments)   Other Reaction: Not Assessed   Penicillin G Hives   Tape Other (See Comments)   adhesive  tape cause redness and skin to peel   Bextra [valdecoxib] Itching   Other reaction(s): Other (See Comments) Other Reaction: Not Assessed   Oxycodone  Itching   Rofecoxib Itching   Other reaction(s): Headache (finding)        Medication List        Accurate as of September 23, 2023  2:34 PM. If you have any questions, ask your nurse or doctor.          acetaminophen  650 MG CR tablet Commonly known as: TYLENOL  Take 650 mg by mouth every 8 (eight) hours as needed for pain.   aspirin EC 81 MG tablet Take 81 mg by mouth daily.   atorvastatin  10 MG tablet Commonly known as: LIPITOR Take 1 tablet (10 mg total) by mouth daily.   cetirizine 10 MG tablet Commonly known as: ZYRTEC Take by mouth.   D-MANNOSE PO Take 1-2 capsules by mouth daily at 6 (six) AM.   estradiol  0.1 MG/GM vaginal cream Commonly known as: ESTRACE  Estrogen Cream Instruction Discard applicator Apply pea sized amount to tip of finger to urethra before bed. Wash hands well after application. Use Monday, Wednesday and Friday   furosemide  20 MG tablet Commonly known as: LASIX  Take 1 tablet (20 mg total) by mouth daily as needed.   multivitamin tablet Take 1 tablet by mouth daily.   mupirocin  ointment 2 % Commonly known as: BACTROBAN  Apply 1 Application topically 2 (two) times daily.   Nasacort  Allergy 24HR 55 MCG/ACT Aero nasal inhaler Generic drug: triamcinolone  Place 1 spray into the nose daily.    omeprazole  20 MG capsule Commonly known as: PRILOSEC Take 1 capsule (20 mg total) by mouth 2 (two) times daily.   senna-docusate 8.6-50 MG tablet Commonly known as: Senokot-S Take by mouth daily.        Allergies:  Allergies  Allergen Reactions   Oxycontin  [Oxycodone  Hcl] Itching    Sleep Apnea   Penicillins Anaphylaxis and Itching   Chloral Hydrate Hives    Other Reaction: Not Assessed   Ciprofloxacin Itching   Colace [Docusate Calcium ]    Methotrexate Derivatives Other (See Comments)    Questionable    Parafon Forte Dsc [Chlorzoxazone] Itching and Other (See Comments)    Other Reaction: Not Assessed   Penicillin G Hives   Tape Other (  See Comments)    adhesive  tape cause redness and skin to peel   Bextra [Valdecoxib] Itching    Other reaction(s): Other (See Comments) Other Reaction: Not Assessed   Oxycodone  Itching   Rofecoxib Itching    Other reaction(s): Headache (finding)    Family History: Family History  Problem Relation Age of Onset   Ulcers Mother    Ulcers Father    Breast cancer Other        great aunt and grandfather   Lung cancer Other        uncle   Hypertension Maternal Grandmother    Heart disease Maternal Grandmother        MI 61s   Diabetes Maternal Grandmother    Arthritis Maternal Grandmother    Diabetes Maternal Grandfather     Social History:  reports that she has never smoked. She has never used smokeless tobacco. She reports that she does not drink alcohol and does not use drugs.  ROS:                                        Physical Exam: BP 139/80   Pulse (!) 109   Ht 5' 7 (1.702 m)   Wt 81.2 kg   BMI 28.04 kg/m   Constitutional:  Alert and oriented, No acute distress. HEENT: [place] AT, moist mucus membranes.  Trachea midline, no masses.   Laboratory Data: Lab Results  Component Value Date   WBC 7.6 07/30/2023   HGB 12.8 07/30/2023   HCT 38.7 07/30/2023   MCV 85.3 07/30/2023   PLT 217.0  07/30/2023    Lab Results  Component Value Date   CREATININE 0.75 07/30/2023    No results found for: PSA  No results found for: TESTOSTERONE  Lab Results  Component Value Date   HGBA1C 5.6 07/30/2023    Urinalysis    Component Value Date/Time   COLORURINE YELLOW 05/03/2023 1037   APPEARANCEUR Clear 05/30/2023 1020   LABSPEC 1.015 05/03/2023 1037   PHURINE 6.0 05/03/2023 1037   GLUCOSEU Negative 05/30/2023 1020   GLUCOSEU NEGATIVE 05/03/2023 1037   HGBUR LARGE (A) 05/03/2023 1037   BILIRUBINUR Negative 05/30/2023 1020   KETONESUR NEGATIVE 05/03/2023 1037   PROTEINUR Negative 05/30/2023 1020   PROTEINUR 30 (A) 10/26/2021 1132   UROBILINOGEN 0.2 05/03/2023 1037   UROBILINOGEN 0.2 05/03/2023 0954   NITRITE Negative 05/30/2023 1020   NITRITE NEGATIVE 05/03/2023 1037   LEUKOCYTESUR Negative 05/30/2023 1020   LEUKOCYTESUR LARGE (A) 05/03/2023 1037    Pertinent Imaging: Urine reviewed and sent for culture.  Chart reviewed  Assessment & Plan: Patient has mixed incontinence.  She primarily leaks when she goes from sitting to standing position.  She has had previous bladder surgery.  She will return for urodynamics and cystoscopy and proceed accordingly  1. Primary stress urinary incontinence (Primary)  - Urinalysis, Complete   No follow-ups on file.  Glendia DELENA Elizabeth, MD  [facility] [address] [place], [place] [address] [phone]

## 2023-09-23 NOTE — Addendum Note (Signed)
 Addended by: ELOUISE SANTA BROCKS on: 09/23/2023 03:04 PM   Modules accepted: Orders

## 2023-09-26 ENCOUNTER — Encounter: Payer: Self-pay | Admitting: Internal Medicine

## 2023-09-30 LAB — CULTURE, URINE COMPREHENSIVE

## 2023-10-07 HISTORY — PX: TOTAL HIP ARTHROPLASTY: SHX124

## 2023-10-14 DIAGNOSIS — N3946 Mixed incontinence: Secondary | ICD-10-CM | POA: Diagnosis not present

## 2023-10-18 ENCOUNTER — Encounter: Payer: Self-pay | Admitting: Emergency Medicine

## 2023-10-18 ENCOUNTER — Telehealth: Payer: Self-pay

## 2023-10-18 ENCOUNTER — Ambulatory Visit
Admission: EM | Admit: 2023-10-18 | Discharge: 2023-10-18 | Disposition: A | Discharge status: Home / Self Care | Attending: Emergency Medicine | Admitting: Emergency Medicine

## 2023-10-18 DIAGNOSIS — R350 Frequency of micturition: Secondary | ICD-10-CM | POA: Diagnosis not present

## 2023-10-18 LAB — POCT URINE DIPSTICK
Bilirubin, UA: NEGATIVE
Glucose, UA: NEGATIVE mg/dL
Ketones, POC UA: NEGATIVE mg/dL
Nitrite, UA: NEGATIVE
Spec Grav, UA: 1.01
Urobilinogen, UA: 0.2 U/dL
pH, UA: 7

## 2023-10-18 MED ORDER — FLUCONAZOLE 150 MG PO TABS
150.0000 mg | ORAL_TABLET | ORAL | 0 refills | Status: DC | PRN
Start: 2023-10-18 — End: 2023-11-12

## 2023-10-18 MED ORDER — NITROFURANTOIN MONOHYD MACRO 100 MG PO CAPS: 100.0000 mg | ORAL_CAPSULE | Freq: Two times a day (BID) | ORAL | 0 refills | Status: AC

## 2023-10-18 NOTE — Discharge Instructions (Signed)
 Your urinalysis shows Maria Keller blood cells but does not show bacteria, your urine will be sent to the lab to determine exactly which bacteria is present, if any changes need to be made to your medications you will be notified  Begin use of Macrobid  twice daily for 7 days as you are symptomatic, you may use Diflucan  as directed if you begin to have vaginal itching irritation or discharge  You may use over-the-counter Azo to help minimize your symptoms until antibiotic removes bacteria, this medication will turn your urine orange  Increase your fluid intake through use of water  As always practice good hygiene, wiping front to back and avoidance of scented vaginal products to prevent further irritation  If symptoms continue to persist after use of medication or recur please follow-up with urgent care or your primary doctor as needed

## 2023-10-18 NOTE — ED Triage Notes (Signed)
 Patient reports that she had an urodynamic test on 10/14/23 and was catheterized during procedure. Patient states they gave her one antibiotic pill to take after procedure and todd her people could get UTI 's from procedure.  Patient now reports urinary frequency, burning sensation and blood on tissue when she wipes that started about 4:30 pm yesterday.

## 2023-10-18 NOTE — ED Provider Notes (Signed)
 Maria Keller    CSN: [id number] Arrival date & time: 10/18/23  0827      History   Chief Complaint Chief Complaint  Patient presents with   Hematuria   Urinary Frequency   Dysuria    HPI Maria Keller is a 73 y.o. female.   Patient presents for evaluation of urinary frequency, dysuria and hematuria when wiping beginning 1 day ago.  Associated intermittent lower pelvic pressure.  History of reoccurring UTI and had urodynamic testing 5 days ago in which she was catheterized, given 1 antibiotic pill at the time prophylactically.  Denies fever, flank pain, vaginal symptoms.  Past Medical History:  Diagnosis Date   Anemia    iron deficiency and B12 deficiency   Arthritis    Bronchitis 08/2015   Chronic back pain    followed by Dr Dannial   Depression    Diverticulosis    Esophageal stricture    requiring dilatation x 2   Fibromyalgia    Fibromyalgia    Fibromyalgia    Follicular lymphoma (HCC)    Followed by Dr DeCastro, s/p chemo and XRT   Follicular lymphoma (HCC)    GERD (gastroesophageal reflux disease)    GERD (gastroesophageal reflux disease)    Hemorrhoid    Hiatal hernia    History of kidney stones    Hypercholesterolemia    IBS (irritable bowel syndrome)    IBS (irritable bowel syndrome)    Nephrolithiasis    followed by Dr Twylla, s/p stents   PONV (postoperative nausea and vomiting)    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   Shingles outbreak 11/26/2011   Sleep apnea     Patient Active Problem List   Diagnosis Date Noted   Stress incontinence 08/04/2023   Frequent UTI 05/12/2023   Dysuria 05/05/2023   Lumbar facet joint pain 05/17/2022   Osteoarthritis of facet joint of lumbar spine 05/17/2022   Dysphagia 04/09/2022   Colon cancer screening 04/09/2022   Need for prophylactic antibiotic 03/20/2022   Spondylosis without myelopathy or radiculopathy, lumbosacral region 02/22/2022   S/P TKR  (total knee replacement), right 02/22/2022   Osteoarthritis of acromioclavicular joints (Bilateral) 12/04/2021   Shoulder enthesopathy (Bilateral) 12/04/2021   Grade 1 Anterolisthesis of lumbosacral spine of L5/S1 12/04/2021   DDD (degenerative disc disease), lumbosacral 12/04/2021   Lumbosacral facet arthropathy 12/04/2021   Osteoarthritis of sacroiliac joints (Bilateral) (HCC) 12/04/2021   UTI (urinary tract infection) 11/19/2021   Status post revision of total knee replacement, right 11/07/2021   Lymphedema 10/15/2021   Chronic venous insufficiency 10/15/2021   Chronic thigh pain (2ry area of Pain) (Bilateral) (R>L) 10/12/2021   Chronic knee pain after total replacement (3ry area of Pain) (Right) 10/12/2021   Chronic shoulder pain (4th area of Pain) (Bilateral) (R>L) 10/12/2021   Chronic pain syndrome 10/11/2021   Chronic use of opiate for therapeutic purpose 10/11/2021   Opioid use (487.5 MME/day) 10/11/2021   Estrogen deficiency 07/08/2021   Loose total knee arthroplasty, sequela (Right) 06/12/2021   Ear lesion 04/10/2020   B12 deficiency 11/09/2019   Hyperglycemia 03/08/2019   Fatigue 11/26/2017   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HCC) 07/15/2017   History of total knee replacement (Right) 09/10/2016   Psoriasis (a type of skin inflammation) 07/11/2016   Hair loss 07/06/2016   Primary osteoarthritis of right knee 10/24/2015   Cough 07/02/2015   Overweight (BMI 25.0-29.9) 07/02/2015   Night sweats 10/27/2014  Degenerative disc disease, lumbar 10/18/2014   Lumbar radiculopathy 10/18/2014   DDD (degenerative disc disease), lumbar 07/13/2014   Lumbar facet syndrome 07/13/2014   Health care maintenance 07/04/2014   Hypercholesterolemia 06/23/2014   Chronic low back pain (1ry area of Pain) (Bilateral) (R>L) w/o sciatica 06/07/2014   Chronic sacroiliac joint pain (Bilateral) 06/07/2014   Non-Hodgkin's lymphoma (HCC)    Pain in joint involving multiple sites  03/12/2014   Nephrolithiasis 02/15/2012   Gastro-esophageal reflux disease without esophagitis 02/15/2012   Fibromyalgia 02/15/2012   Myalgia and myositis 02/15/2012   History of lymphoma 11/13/2011   Anemia 11/13/2011   Lymphoma (HCC) 11/13/2011    Past Surgical History:  Procedure Laterality Date   APPENDECTOMY     BIOPSY  11/05/2022   Procedure: BIOPSY;  Surgeon: Maryruth Ole DASEN, MD;  Location: [facility] ENDOSCOPY;  Service: Endoscopy;;   BLADDER SURGERY  1993   BREAST BIOPSY N/A    capsule endoscopy     CHOLECYSTECTOMY  1995   COLONOSCOPY WITH PROPOFOL  N/A 06/19/2017   Procedure: COLONOSCOPY WITH PROPOFOL ;  Surgeon: Viktoria Lamar DASEN, MD;  Location[facility] END[facility]COPY;  Service: Endoscopy;  Laterality: N/A;   COLONOSCOPY WITH PROPOFOL  N/A 11/05/2022   Procedure: COLONOSCOPY WITH PROPOFOL ;  Surgeon: Maryruth Ole DASEN, MD;  Location: [facility] ENDOSCOPY;  Service: Endoscopy;  Laterality: N/A;   ESOPHAGOGASTRODUODENOSCOPY (EGD) WITH PROPOFOL  N/A 06/19/2017   Procedure: ESOPHAGOGASTRODUODENOSCOPY (EGD) WITH PROPOFOL ;  Surgeon: Viktoria Lamar DASEN, MD;  Location: [facility] ENDOSCOPY;  Service: Endoscopy;  Laterality: N/A;   ESOPHAGOGASTRODUODENOSCOPY (EGD) WITH PROPOFOL  N/A 11/05/2022   Procedure: ESOPHAGOGASTRODUODENOSCOPY (EGD) WITH PROPOFOL ;  Surgeon: Maryruth Ole DASEN, MD;  Location: [facility] ENDOSCOPY;  Service: Endoscopy;  Laterality: N/A;   EYE SURGERY     both cataract remove   FRACTURE SURGERY Left    ankle, plate with 8 screws   history of helicobacter pylori infection     JOINT REPLACEMENT Right    knee   LEFT OOPHORECTOMY     LEG SURGERY Left    ORIF ANKLE FRACTURE  2012   OVARIAN CYST REMOVAL     right   POLYPECTOMY  11/05/2022   Procedure: POLYPECTOMY;  Surgeon: Maryruth Ole DASEN, MD;  Location: [facility] ENDOSCOPY;  Service: Endoscopy;;   RECTOCELE REPAIR N/A    REPLACEMENT TOTAL KNEE Right 03/19/2016   TONSILECTOMY/ADENOIDECTOMY WITH MYRINGOTOMY  1970   TONSILLECTOMY N/A    TOTAL  KNEE REVISION Right 11/07/2021   Procedure: TOTAL KNEE REVISION;  Surgeon: Edie Norleen PARAS, MD;  Location: [facility] ORS;  Service: Orthopedics;  Laterality: Right;   TUBAL LIGATION  1976   VAGINAL HYSTERECTOMY  1980's   secondary to bleeding    OB History   No obstetric history on file.      Home Medications    Prior to Admission medications   Medication Sig Start Date End Date Taking? Authorizing Provider  fluconazole  (DIFLUCAN ) 150 MG tablet Take 1 tablet (150 mg total) by mouth every three (3) days as needed for up to 2 doses. 10/18/23  Yes Shenay Torti R, NP  nitrofurantoin , macrocrystal-monohydrate, (MACROBID ) 100 MG capsule Take 1 capsule (100 mg total) by mouth 2 (two) times daily for 7 days. 10/18/23 10/25/23 Yes Alliene Klugh R, NP  acetaminophen  (TYLENOL ) 650 MG CR tablet Take 650 mg by mouth every 8 (eight) hours as needed for pain.    [provider]  aspirin EC 81 MG tablet Take 81 mg by mouth daily. 03/17/10   [provider]  atorvastatin  (LIPITOR)  10 MG tablet Take 1 tablet (10 mg total) by mouth daily. 08/01/23   Glendia Shad, MD  cetirizine (ZYRTEC) 10 MG tablet Take by mouth.    [provider]  D-MANNOSE PO Take 1-2 capsules by mouth daily at 6 (six) AM.    [provider]  estradiol  (ESTRACE ) 0.1 MG/GM vaginal cream Estrogen Cream Instruction Discard applicator Apply pea sized amount to tip of finger to urethra before bed. Wash hands well after application. Use Monday, Wednesday and Friday 05/30/23   Penne Knee, MD  furosemide  (LASIX ) 20 MG tablet Take 1 tablet (20 mg total) by mouth daily as needed. 08/01/23   Glendia Shad, MD  Multiple Vitamin (MULTIVITAMIN) tablet Take 1 tablet by mouth daily.    [provider]  mupirocin  ointment (BACTROBAN ) 2 % Apply 1 Application topically 2 (two) times daily. 01/01/23   Glendia Shad, MD  omeprazole  (PRILOSEC) 20 MG capsule Take 1 capsule (20 mg total) by mouth 2 (two) times  daily. 08/01/23   Glendia Shad, MD  senna-docusate (SENOKOT-S) 8.6-50 MG per tablet Take by mouth daily.    [provider]  triamcinolone  (NASACORT  ALLERGY 24HR) 55 MCG/ACT AERO nasal inhaler Place 1 spray into the nose daily.    [provider]    Family History Family History  Problem Relation Age of Onset   Ulcers Mother    Ulcers Father    Breast cancer Other        great aunt and grandfather   Lung cancer Other        uncle   Hypertension Maternal Grandmother    Heart disease Maternal Grandmother        MI 29s   Diabetes Maternal Grandmother    Arthritis Maternal Grandmother    Diabetes Maternal Grandfather     Social History Social History   Tobacco Use   Smoking status: Never   Smokeless tobacco: Never  Vaping Use   Vaping status: Never Used  Substance Use Topics   Alcohol use: No    Alcohol/week: 0.0 standard drinks of alcohol   Drug use: No     Allergies   Oxycontin  [oxycodone  hcl], Penicillins, Chloral hydrate, Ciprofloxacin, Colace [docusate calcium ], Methotrexate derivatives, Parafon forte dsc [chlorzoxazone], Penicillin g, Tape, Bextra [valdecoxib], Oxycodone , and Rofecoxib   Review of Systems Review of Systems   Physical Exam Triage Vital Signs ED Triage Vitals  Encounter Vitals Group     BP 10/18/23 0840 130/76     Girls Systolic BP Percentile --      Girls Diastolic BP Percentile --      Boys Systolic BP Percentile --      Boys Diastolic BP Percentile --      Pulse Rate 10/18/23 0840 78     Resp 10/18/23 0840 18     Temp 10/18/23 0840 98.3 F (36.8 C)     Temp Source 10/18/23 0840 Oral     SpO2 10/18/23 0840 95 %     Weight --      Height --      Head Circumference --      Peak Flow --      Pain Score 10/18/23 0847 0     Pain Loc --      Pain Education --      Exclude from Growth Chart --    No data found.  Updated Vital Signs BP 130/76 (BP Location: Right Arm)   Pulse 78   Temp 98.3 F (36.8 C) (Oral)  Resp 18   SpO2 95%   Visual Acuity Right Eye Distance:   Left Eye Distance:   Bilateral Distance:    Right Eye Near:   Left Eye Near:    Bilateral Near:     Physical Exam Constitutional:      Appearance: Normal appearance.  Eyes:     Extraocular Movements: Extraocular movements intact.  Pulmonary:     Effort: Pulmonary effort is normal.  Abdominal:     Tenderness: There is no abdominal tenderness. There is no right CVA tenderness, left CVA tenderness or guarding.  Neurological:     Mental Status: She is alert and oriented to person, place, and time.      UC Treatments / Results  Labs (all labs ordered are listed, but only abnormal results are displayed) Labs Reviewed  URINE CULTURE  POCT URINE DIPSTICK    EKG   Radiology No results found.  Procedures Procedures (including critical care time)  Medications Ordered in UC Medications - No data to display  Initial Impression / Assessment and Plan / UC Course  I have reviewed the triage vital signs and the nursing notes.  Pertinent labs & imaging results that were available during my care of the patient were reviewed by me and considered in my medical decision making (see chart for details).  Urinary frequency    urinalysis showed leukocytes, negative for nitrates, sent for culture, discussed findings with patient, prescribed Macrobid  and Diflucan  per empirically as she is symptomatic, available over-the-counter medications and nonpharmacological supportive care advised follow-up as needed Final Clinical Impressions(s) / UC Diagnoses   Final diagnoses:  Urinary frequency     Discharge Instructions      Your urinalysis shows Kennisha Qin blood cells but does not show bacteria, your urine will be sent to the lab to determine exactly which bacteria is present, if any changes need to be made to your medications you will be notified  Begin use of Macrobid  twice daily for 7 days as you are symptomatic, you may use  Diflucan  as directed if you begin to have vaginal itching irritation or discharge  You may use over-the-counter Azo to help minimize your symptoms until antibiotic removes bacteria, this medication will turn your urine orange  Increase your fluid intake through use of water  As always practice good hygiene, wiping front to back and avoidance of scented vaginal products to prevent further irritation  If symptoms continue to persist after use of medication or recur please follow-up with urgent care or your primary doctor as needed    ED Prescriptions     Medication Sig Dispense Auth. Provider   nitrofurantoin , macrocrystal-monohydrate, (MACROBID ) 100 MG capsule Take 1 capsule (100 mg total) by mouth 2 (two) times daily for 7 days. 14 capsule Giordano Getman R, NP   fluconazole  (DIFLUCAN ) 150 MG tablet Take 1 tablet (150 mg total) by mouth every three (3) days as needed for up to 2 doses. 2 tablet Marrell Dicaprio R, NP      PDMP not reviewed this encounter.   Teresa Shelba SAUNDERS, NP 10/18/23 0900

## 2023-10-18 NOTE — Telephone Encounter (Signed)
 Pt called in with C/o frequency, urgency, pain in abdomen, Burning with urination, smelly urine. No fever or nausea. She just had a urodynamic study this week and was cathed for procedure. I let her unfortunately we do not have any same day spots today but urged her to seek urgent care attention. Pt voiced understanding and I gave her location of urgent care in her near area.

## 2023-10-20 LAB — URINE CULTURE: Culture: >=100000 — AB

## 2023-10-21 ENCOUNTER — Ambulatory Visit: Payer: Self-pay

## 2023-10-27 DIAGNOSIS — C8238 Follicular lymphoma grade IIIa, lymph nodes of multiple sites: Secondary | ICD-10-CM | POA: Diagnosis not present

## 2023-10-27 DIAGNOSIS — E876 Hypokalemia: Secondary | ICD-10-CM | POA: Diagnosis not present

## 2023-10-27 DIAGNOSIS — Z7982 Long term (current) use of aspirin: Secondary | ICD-10-CM | POA: Diagnosis not present

## 2023-10-27 DIAGNOSIS — Z471 Aftercare following joint replacement surgery: Secondary | ICD-10-CM | POA: Diagnosis not present

## 2023-10-27 DIAGNOSIS — I959 Hypotension, unspecified: Secondary | ICD-10-CM | POA: Diagnosis not present

## 2023-10-27 DIAGNOSIS — S72031A Displaced midcervical fracture of right femur, initial encounter for closed fracture: Secondary | ICD-10-CM | POA: Diagnosis not present

## 2023-10-27 DIAGNOSIS — W1830XA Fall on same level, unspecified, initial encounter: Secondary | ICD-10-CM | POA: Diagnosis not present

## 2023-10-27 DIAGNOSIS — K219 Gastro-esophageal reflux disease without esophagitis: Secondary | ICD-10-CM | POA: Diagnosis not present

## 2023-10-27 DIAGNOSIS — I471 Supraventricular tachycardia, unspecified: Secondary | ICD-10-CM | POA: Diagnosis not present

## 2023-10-27 DIAGNOSIS — S72001A Fracture of unspecified part of neck of right femur, initial encounter for closed fracture: Secondary | ICD-10-CM | POA: Diagnosis not present

## 2023-10-27 DIAGNOSIS — Z87440 Personal history of urinary (tract) infections: Secondary | ICD-10-CM | POA: Diagnosis not present

## 2023-10-27 DIAGNOSIS — Z9889 Other specified postprocedural states: Secondary | ICD-10-CM | POA: Diagnosis not present

## 2023-10-27 DIAGNOSIS — K5900 Constipation, unspecified: Secondary | ICD-10-CM | POA: Diagnosis not present

## 2023-10-27 DIAGNOSIS — D62 Acute posthemorrhagic anemia: Secondary | ICD-10-CM | POA: Diagnosis not present

## 2023-10-27 DIAGNOSIS — R0902 Hypoxemia: Secondary | ICD-10-CM | POA: Diagnosis not present

## 2023-10-27 DIAGNOSIS — Z881 Allergy status to other antibiotic agents status: Secondary | ICD-10-CM | POA: Diagnosis not present

## 2023-10-27 DIAGNOSIS — M797 Fibromyalgia: Secondary | ICD-10-CM | POA: Diagnosis not present

## 2023-10-27 DIAGNOSIS — R000 Tachycardia, unspecified: Secondary | ICD-10-CM | POA: Diagnosis not present

## 2023-10-27 DIAGNOSIS — G4730 Sleep apnea, unspecified: Secondary | ICD-10-CM | POA: Diagnosis not present

## 2023-10-27 DIAGNOSIS — S0990XA Unspecified injury of head, initial encounter: Secondary | ICD-10-CM | POA: Diagnosis not present

## 2023-10-27 DIAGNOSIS — E559 Vitamin D deficiency, unspecified: Secondary | ICD-10-CM | POA: Diagnosis not present

## 2023-10-27 DIAGNOSIS — N958 Other specified menopausal and perimenopausal disorders: Secondary | ICD-10-CM | POA: Diagnosis not present

## 2023-10-27 DIAGNOSIS — Z96651 Presence of right artificial knee joint: Secondary | ICD-10-CM | POA: Diagnosis not present

## 2023-10-27 DIAGNOSIS — S72011A Unspecified intracapsular fracture of right femur, initial encounter for closed fracture: Secondary | ICD-10-CM | POA: Diagnosis not present

## 2023-10-27 DIAGNOSIS — E7800 Pure hypercholesterolemia, unspecified: Secondary | ICD-10-CM | POA: Diagnosis not present

## 2023-10-27 DIAGNOSIS — Z96641 Presence of right artificial hip joint: Secondary | ICD-10-CM | POA: Diagnosis not present

## 2023-10-27 DIAGNOSIS — M25551 Pain in right hip: Secondary | ICD-10-CM | POA: Diagnosis not present

## 2023-10-27 DIAGNOSIS — K449 Diaphragmatic hernia without obstruction or gangrene: Secondary | ICD-10-CM | POA: Diagnosis not present

## 2023-10-27 DIAGNOSIS — Z87442 Personal history of urinary calculi: Secondary | ICD-10-CM | POA: Diagnosis not present

## 2023-10-27 DIAGNOSIS — Z87892 Personal history of anaphylaxis: Secondary | ICD-10-CM | POA: Diagnosis not present

## 2023-10-27 DIAGNOSIS — Z83719 Family history of colon polyps, unspecified: Secondary | ICD-10-CM | POA: Diagnosis not present

## 2023-10-27 DIAGNOSIS — I951 Orthostatic hypotension: Secondary | ICD-10-CM | POA: Diagnosis not present

## 2023-10-27 DIAGNOSIS — M80051A Age-related osteoporosis with current pathological fracture, right femur, initial encounter for fracture: Secondary | ICD-10-CM | POA: Diagnosis not present

## 2023-10-27 DIAGNOSIS — H5712 Ocular pain, left eye: Secondary | ICD-10-CM | POA: Diagnosis not present

## 2023-10-27 DIAGNOSIS — Z886 Allergy status to analgesic agent status: Secondary | ICD-10-CM | POA: Diagnosis not present

## 2023-10-27 DIAGNOSIS — J9811 Atelectasis: Secondary | ICD-10-CM | POA: Diagnosis not present

## 2023-10-27 DIAGNOSIS — D72829 Elevated white blood cell count, unspecified: Secondary | ICD-10-CM | POA: Diagnosis not present

## 2023-10-27 DIAGNOSIS — R55 Syncope and collapse: Secondary | ICD-10-CM | POA: Diagnosis not present

## 2023-10-27 DIAGNOSIS — Z885 Allergy status to narcotic agent status: Secondary | ICD-10-CM | POA: Diagnosis not present

## 2023-10-27 DIAGNOSIS — Z833 Family history of diabetes mellitus: Secondary | ICD-10-CM | POA: Diagnosis not present

## 2023-10-27 DIAGNOSIS — G8918 Other acute postprocedural pain: Secondary | ICD-10-CM | POA: Diagnosis not present

## 2023-10-27 DIAGNOSIS — L409 Psoriasis, unspecified: Secondary | ICD-10-CM | POA: Diagnosis not present

## 2023-10-27 DIAGNOSIS — Z90710 Acquired absence of both cervix and uterus: Secondary | ICD-10-CM | POA: Diagnosis not present

## 2023-10-27 DIAGNOSIS — M810 Age-related osteoporosis without current pathological fracture: Secondary | ICD-10-CM | POA: Diagnosis not present

## 2023-10-27 DIAGNOSIS — R42 Dizziness and giddiness: Secondary | ICD-10-CM | POA: Diagnosis not present

## 2023-10-27 DIAGNOSIS — E785 Hyperlipidemia, unspecified: Secondary | ICD-10-CM | POA: Diagnosis not present

## 2023-10-27 DIAGNOSIS — Z8572 Personal history of non-Hodgkin lymphomas: Secondary | ICD-10-CM | POA: Diagnosis not present

## 2023-10-27 DIAGNOSIS — Z880 Allergy status to penicillin: Secondary | ICD-10-CM | POA: Diagnosis not present

## 2023-10-31 DIAGNOSIS — R55 Syncope and collapse: Secondary | ICD-10-CM | POA: Diagnosis not present

## 2023-11-01 DIAGNOSIS — K5790 Diverticulosis of intestine, part unspecified, without perforation or abscess without bleeding: Secondary | ICD-10-CM | POA: Diagnosis not present

## 2023-11-01 DIAGNOSIS — D509 Iron deficiency anemia, unspecified: Secondary | ICD-10-CM | POA: Diagnosis not present

## 2023-11-01 DIAGNOSIS — M1990 Unspecified osteoarthritis, unspecified site: Secondary | ICD-10-CM | POA: Diagnosis not present

## 2023-11-01 DIAGNOSIS — K449 Diaphragmatic hernia without obstruction or gangrene: Secondary | ICD-10-CM | POA: Diagnosis not present

## 2023-11-01 DIAGNOSIS — K589 Irritable bowel syndrome without diarrhea: Secondary | ICD-10-CM | POA: Diagnosis not present

## 2023-11-01 DIAGNOSIS — M80051D Age-related osteoporosis with current pathological fracture, right femur, subsequent encounter for fracture with routine healing: Secondary | ICD-10-CM | POA: Diagnosis not present

## 2023-11-01 DIAGNOSIS — Z96641 Presence of right artificial hip joint: Secondary | ICD-10-CM | POA: Diagnosis not present

## 2023-11-01 DIAGNOSIS — E7800 Pure hypercholesterolemia, unspecified: Secondary | ICD-10-CM | POA: Diagnosis not present

## 2023-11-01 DIAGNOSIS — K648 Other hemorrhoids: Secondary | ICD-10-CM | POA: Diagnosis not present

## 2023-11-01 DIAGNOSIS — K222 Esophageal obstruction: Secondary | ICD-10-CM | POA: Diagnosis not present

## 2023-11-01 DIAGNOSIS — K219 Gastro-esophageal reflux disease without esophagitis: Secondary | ICD-10-CM | POA: Diagnosis not present

## 2023-11-01 DIAGNOSIS — G4730 Sleep apnea, unspecified: Secondary | ICD-10-CM | POA: Diagnosis not present

## 2023-11-01 DIAGNOSIS — W19XXXD Unspecified fall, subsequent encounter: Secondary | ICD-10-CM | POA: Diagnosis not present

## 2023-11-01 DIAGNOSIS — M797 Fibromyalgia: Secondary | ICD-10-CM | POA: Diagnosis not present

## 2023-11-01 DIAGNOSIS — F32A Depression, unspecified: Secondary | ICD-10-CM | POA: Diagnosis not present

## 2023-11-01 DIAGNOSIS — Z96651 Presence of right artificial knee joint: Secondary | ICD-10-CM | POA: Diagnosis not present

## 2023-11-01 DIAGNOSIS — C8238 Follicular lymphoma grade IIIa, lymph nodes of multiple sites: Secondary | ICD-10-CM | POA: Diagnosis not present

## 2023-11-01 DIAGNOSIS — G8929 Other chronic pain: Secondary | ICD-10-CM | POA: Diagnosis not present

## 2023-11-04 ENCOUNTER — Ambulatory Visit: Admitting: Urology

## 2023-11-04 DIAGNOSIS — G8929 Other chronic pain: Secondary | ICD-10-CM | POA: Diagnosis not present

## 2023-11-04 DIAGNOSIS — C8238 Follicular lymphoma grade IIIa, lymph nodes of multiple sites: Secondary | ICD-10-CM | POA: Diagnosis not present

## 2023-11-04 DIAGNOSIS — M80051D Age-related osteoporosis with current pathological fracture, right femur, subsequent encounter for fracture with routine healing: Secondary | ICD-10-CM | POA: Diagnosis not present

## 2023-11-04 DIAGNOSIS — M797 Fibromyalgia: Secondary | ICD-10-CM | POA: Diagnosis not present

## 2023-11-04 DIAGNOSIS — M1990 Unspecified osteoarthritis, unspecified site: Secondary | ICD-10-CM | POA: Diagnosis not present

## 2023-11-04 DIAGNOSIS — F32A Depression, unspecified: Secondary | ICD-10-CM | POA: Diagnosis not present

## 2023-11-06 DIAGNOSIS — C8238 Follicular lymphoma grade IIIa, lymph nodes of multiple sites: Secondary | ICD-10-CM | POA: Diagnosis not present

## 2023-11-06 DIAGNOSIS — G8929 Other chronic pain: Secondary | ICD-10-CM | POA: Diagnosis not present

## 2023-11-06 DIAGNOSIS — M1990 Unspecified osteoarthritis, unspecified site: Secondary | ICD-10-CM | POA: Diagnosis not present

## 2023-11-06 DIAGNOSIS — M797 Fibromyalgia: Secondary | ICD-10-CM | POA: Diagnosis not present

## 2023-11-06 DIAGNOSIS — M80051D Age-related osteoporosis with current pathological fracture, right femur, subsequent encounter for fracture with routine healing: Secondary | ICD-10-CM | POA: Diagnosis not present

## 2023-11-06 DIAGNOSIS — F32A Depression, unspecified: Secondary | ICD-10-CM | POA: Diagnosis not present

## 2023-11-08 DIAGNOSIS — M797 Fibromyalgia: Secondary | ICD-10-CM | POA: Diagnosis not present

## 2023-11-08 DIAGNOSIS — F32A Depression, unspecified: Secondary | ICD-10-CM | POA: Diagnosis not present

## 2023-11-08 DIAGNOSIS — G8929 Other chronic pain: Secondary | ICD-10-CM | POA: Diagnosis not present

## 2023-11-08 DIAGNOSIS — M80051D Age-related osteoporosis with current pathological fracture, right femur, subsequent encounter for fracture with routine healing: Secondary | ICD-10-CM | POA: Diagnosis not present

## 2023-11-08 DIAGNOSIS — M1990 Unspecified osteoarthritis, unspecified site: Secondary | ICD-10-CM | POA: Diagnosis not present

## 2023-11-08 DIAGNOSIS — C8238 Follicular lymphoma grade IIIa, lymph nodes of multiple sites: Secondary | ICD-10-CM | POA: Diagnosis not present

## 2023-11-12 ENCOUNTER — Encounter: Payer: Self-pay | Admitting: Internal Medicine

## 2023-11-12 ENCOUNTER — Ambulatory Visit: Admitting: Internal Medicine

## 2023-11-12 VITALS — BP 120/70 | HR 82 | Temp 99.1°F | Resp 16 | Ht 67.0 in | Wt 176.4 lb

## 2023-11-12 DIAGNOSIS — E7800 Pure hypercholesterolemia, unspecified: Secondary | ICD-10-CM

## 2023-11-12 DIAGNOSIS — R059 Cough, unspecified: Secondary | ICD-10-CM | POA: Diagnosis not present

## 2023-11-12 DIAGNOSIS — S72001D Fracture of unspecified part of neck of right femur, subsequent encounter for closed fracture with routine healing: Secondary | ICD-10-CM

## 2023-11-12 DIAGNOSIS — R55 Syncope and collapse: Secondary | ICD-10-CM | POA: Diagnosis not present

## 2023-11-12 DIAGNOSIS — R739 Hyperglycemia, unspecified: Secondary | ICD-10-CM

## 2023-11-12 DIAGNOSIS — C8590 Non-Hodgkin lymphoma, unspecified, unspecified site: Secondary | ICD-10-CM

## 2023-11-12 DIAGNOSIS — D649 Anemia, unspecified: Secondary | ICD-10-CM | POA: Diagnosis not present

## 2023-11-12 DIAGNOSIS — E538 Deficiency of other specified B group vitamins: Secondary | ICD-10-CM | POA: Diagnosis not present

## 2023-11-12 LAB — CBC WITH DIFFERENTIAL/PLATELET
Basophils Absolute: 0.1 K/uL (ref 0.0–0.1)
Basophils Relative: 0.8 % (ref 0.0–3.0)
Eosinophils Absolute: 0.8 K/uL — ABNORMAL HIGH (ref 0.0–0.7)
Eosinophils Relative: 8.2 % — ABNORMAL HIGH (ref 0.0–5.0)
HCT: 33 % — ABNORMAL LOW (ref 36.0–46.0)
Hemoglobin: 10.7 g/dL — ABNORMAL LOW (ref 12.0–15.0)
Lymphocytes Relative: 31.2 % (ref 12.0–46.0)
Lymphs Abs: 2.9 K/uL (ref 0.7–4.0)
MCHC: 32.6 g/dL (ref 30.0–36.0)
MCV: 85.4 fl (ref 78.0–100.0)
Monocytes Absolute: 0.7 K/uL (ref 0.1–1.0)
Monocytes Relative: 7.8 % (ref 3.0–12.0)
Neutro Abs: 4.8 K/uL (ref 1.4–7.7)
Neutrophils Relative %: 52 % (ref 43.0–77.0)
Platelets: 553 K/uL — ABNORMAL HIGH (ref 150.0–400.0)
RBC: 3.86 Mil/uL — ABNORMAL LOW (ref 3.87–5.11)
RDW: 15.8 % — ABNORMAL HIGH (ref 11.5–15.5)
WBC: 9.3 K/uL (ref 4.0–10.5)

## 2023-11-12 LAB — IBC + FERRITIN
Ferritin: 99.2 ng/mL (ref 10.0–291.0)
Iron: 50 ug/dL (ref 42–145)
Saturation Ratios: 15.3 % — ABNORMAL LOW (ref 20.0–50.0)
TIBC: 327.6 ug/dL (ref 250.0–450.0)
Transferrin: 234 mg/dL (ref 212.0–360.0)

## 2023-11-12 LAB — HEPATIC FUNCTION PANEL
ALT: 13 U/L (ref 0–35)
AST: 19 U/L (ref 0–37)
Albumin: 4.3 g/dL (ref 3.5–5.2)
Alkaline Phosphatase: 89 U/L (ref 39–117)
Bilirubin, Direct: 0.1 mg/dL (ref 0.0–0.3)
Total Bilirubin: 0.4 mg/dL (ref 0.2–1.2)
Total Protein: 7.6 g/dL (ref 6.0–8.3)

## 2023-11-12 LAB — BASIC METABOLIC PANEL WITH GFR
BUN: 10 mg/dL (ref 6–23)
CO2: 29 meq/L (ref 19–32)
Calcium: 10 mg/dL (ref 8.4–10.5)
Chloride: 102 meq/L (ref 96–112)
Creatinine, Ser: 0.67 mg/dL (ref 0.40–1.20)
GFR: 85.78 mL/min (ref >60.00)
Glucose, Bld: 94 mg/dL (ref 70–99)
Potassium: 3.7 meq/L (ref 3.5–5.1)
Sodium: 142 meq/L (ref 135–145)

## 2023-11-12 LAB — VITAMIN B12: Vitamin B-12: 625 pg/mL (ref 211–911)

## 2023-11-12 LAB — MAGNESIUM: Magnesium: 2 mg/dL (ref 1.5–2.5)

## 2023-11-12 MED ORDER — DOXYCYCLINE HYCLATE 100 MG PO TABS: 100.0000 mg | ORAL_TABLET | Freq: Two times a day (BID) | ORAL | 0 refills | Status: DC

## 2023-11-12 MED ORDER — FLUCONAZOLE 150 MG PO TABS: ORAL_TABLET | ORAL | 0 refills | Status: DC

## 2023-11-12 MED ORDER — PREDNISONE 10 MG PO TABS: ORAL_TABLET | ORAL | 0 refills | Status: DC

## 2023-11-12 NOTE — Progress Notes (Signed)
 Subjective:    Patient ID: Maria Keller, female    DOB: 11/09/1950, 73 y.o.   MRN: [id number]  Patient here for  Chief Complaint  Patient presents with   Hospitalization Follow-up    HPI Here for hospital follow up. Admitted 10/27/23 - 10/31/23 - s/p fall with resulting closed fracture of right hip and syncope. Husband reports prior to episode, she had not eaten much. Feltt sweaty. S/p R THA on 10/28/23. Recommended LMWH x 28 days total (to end 11/26/23). Also recommended 7 day holter monitor at discharge. Regarding syncope, TTE 9/21 - no structural abnormalities, EF 55%. Symptomatic orthostasis noted. 5 beat run of SVT on telementry 10/31/23, otherwise no arrhythmias captured. CT - negative for PE. Recommended adequate hydration and compression stockings. Noted hypoxia during hospitalization. CT PE negative on 9/23. She had brief episode of hypoxia again on 9/24 with T Max 38.7 - CXR repeated with atelectasis without infiltrates. Procalcitonin negative. UA negative. WBC downtrending to 10.6 on 9/25 from 10.9 on 9/24. She does have reported h/o sleep apnea that was brought on by use of chronic oxycodone  + fentanyl  patch 10-15 years ago for chronic back pain; however has been off narcotics for years, but now restarted with hip fracture. She reports repeat sleep study was negative. On 9/25, afebrile and satting well on RA. At discharge, recommended to continue incentive spirometry and limiting sedating medication. Found to have post op anemia. Since discharge, she is working with PT. Walking. Trying to do her exercises. Has noticed increased cough  - started a few days ago. Sore throat. Headache. Persistent cough. Some coughing fits.    Past Medical History:  Diagnosis Date   Anemia    iron deficiency and B12 deficiency   Arthritis    Bronchitis 08/2015   Chronic back pain    followed by Dr Dannial   Depression    Diverticulosis    Esophageal stricture    requiring dilatation x 2   Fibromyalgia     Fibromyalgia    Fibromyalgia    Follicular lymphoma (HCC)    Followed by Dr DeCastro, s/p chemo and XRT   Follicular lymphoma (HCC)    GERD (gastroesophageal reflux disease)    GERD (gastroesophageal reflux disease)    Hemorrhoid    Hiatal hernia    History of kidney stones    Hypercholesterolemia    IBS (irritable bowel syndrome)    IBS (irritable bowel syndrome)    Nephrolithiasis    followed by Dr Twylla, s/p stents   PONV (postoperative nausea and vomiting)    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   Shingles outbreak 11/26/2011   Sleep apnea    Past Surgical History:  Procedure Laterality Date   APPENDECTOMY     BIOPSY  11/05/2022   Procedure: BIOPSY;  Surgeon: Maryruth Ole DASEN, MD;  Location: [facility] ENDOSCOPY;  Service: Endoscopy;;   BLADDER SURGERY  1993   BREAST BIOPSY N/A    capsule endoscopy     CHOLECYSTECTOMY  1995   COLONOSCOPY WITH PROPOFOL  N/A 06/19/2017   Procedure: COLONOSCOPY WITH PROPOFOL ;  Surgeon: Viktoria Lamar DASEN, MD;  Location: [facility] ENDOSCOPY;  Service: Endoscopy;  Laterality: N/A;   COLONOSCOPY WITH PROPOFOL  N/A 11/05/2022   Procedure: COLONOSCOPY WITH PROPOFOL ;  Surgeon: Maryruth Ole DASEN, MD;  Location: [facility] ENDOSCOPY;  Service: Endoscopy;  Laterality: N/A;   ESOPHAGOGASTRODUODENOSCOPY (EGD) WITH PROPOFOL  N/A 06/19/2017   Procedure: ESOPHAGOGASTRODUODENOSCOPY (EGD) WITH PROPOFOL ;  Surgeon:  Viktoria Lamar DASEN, MD;  Location: [facility] ENDOSCOPY;  Service: Endoscopy;  Laterality: N/A;   ESOPHAGOGASTRODUODENOSCOPY (EGD) WITH PROPOFOL  N/A 11/05/2022   Procedure: ESOPHAGOGASTRODUODENOSCOPY (EGD) WITH PROPOFOL ;  Surgeon: Maryruth Ole DASEN, MD;  Location: [facility] ENDOSCOPY;  Service: Endoscopy;  Laterality: N/A;   EYE SURGERY     both cataract remove   FRACTURE SURGERY Left    ankle, plate with 8 screws   history of helicobacter pylori infection     JOINT REPLACEMENT Right    knee   LEFT OOPHORECTOMY     LEG  SURGERY Left    ORIF ANKLE FRACTURE  2012   OVARIAN CYST REMOVAL     right   POLYPECTOMY  11/05/2022   Procedure: POLYPECTOMY;  Surgeon: Maryruth Ole DASEN, MD;  Location: [facility] ENDOSCOPY;  Service: Endoscopy;;   RECTOCELE REPAIR N/A    REPLACEMENT TOTAL KNEE Right 03/19/2016   TONSILECTOMY/ADENOIDECTOMY WITH MYRINGOTOMY  1970   TONSILLECTOMY N/A    TOTAL KNEE REVISION Right 11/07/2021   Procedure: TOTAL KNEE REVISION;  Surgeon: Edie Norleen PARAS, MD;  Location: [facility] ORS;  Service: Orthopedics;  Laterality: Right;   TUBAL LIGATION  1976   VAGINAL HYSTERECTOMY  1980's   secondary to bleeding   Family History  Problem Relation Age of Onset   Ulcers Mother    Ulcers Father    Breast cancer Other        great aunt and grandfather   Lung cancer Other        uncle   Hypertension Maternal Grandmother    Heart disease Maternal Grandmother        MI 44s   Diabetes Maternal Grandmother    Arthritis Maternal Grandmother    Diabetes Maternal Grandfather    Social History   Socioeconomic History   Marital status: Married    Spouse name: [name]   Number of children: Not on file   Years of education: Not on file   Highest education level: Associate degree: occupational, Scientist, product/process development, or vocational program  Occupational History   Not on file  Tobacco Use   Smoking status: Never   Smokeless tobacco: Never  Vaping Use   Vaping status: Never Used  Substance and Sexual Activity   Alcohol use: No    Alcohol/week: 0.0 standard drinks of alcohol   Drug use: No   Sexual activity: Yes  Other Topics Concern   Not on file  Social History Narrative   Not on file   Social Drivers of Health   Financial Resource Strain: Low Risk  (10/27/2023)   Received from [facility]   Overall Financial Resource Strain (CARDIA)    Difficulty of Paying Living Expenses: Not very hard  Food Insecurity: No Food Insecurity (10/27/2023)   Received from [facility]   Hunger Vital  Sign    Within the past 12 months, you worried that your food would run out before you got the money to buy more.: Never true    Within the past 12 months, the food you bought just didn't last and you didn't have money to get more.: Never true  Transportation Needs: Unknown (10/27/2023)   Received from [facility] - Transportation    In the past 12 months, has lack of transportation kept you from medical appointments or from getting medications?: No    Lack of Transportation (Non-Medical): Not on file  Physical Activity: Inactive (07/29/2023)   Exercise Vital Sign    Days of  Exercise per Week: 0 days    Minutes of Exercise per Session: Not on file  Stress: No Stress Concern Present (07/29/2023)   [employer] of Occupational Health - Occupational Stress Questionnaire    Feeling of Stress: Not at all  Social Connections: Socially Integrated (07/29/2023)   Social Connection and Isolation Panel    Frequency of Communication with Friends and Family: More than three times a week    Frequency of Social Gatherings with Friends and Family: Twice a week    Attends Religious Services: More than 4 times per year    Active Member of [employer] or Organizations: Yes    Attends Engineer, structural: More than 4 times per year    Marital Status: Married     Review of Systems  Constitutional:  Positive for fatigue.       No documented fever.   HENT:  Positive for congestion and sore throat.   Respiratory:  Positive for cough. Negative for chest tightness.        No increased sob.   Cardiovascular:  Negative for chest pain and palpitations.  Gastrointestinal:  Negative for abdominal pain, diarrhea and vomiting.  Genitourinary:  Negative for difficulty urinating and dysuria.  Musculoskeletal:  Negative for myalgias.       S/p hip fracture. Working with therapy as outlined.   Skin:  Negative for color change and rash.  Neurological:  Negative for dizziness and  headaches.  Psychiatric/Behavioral:  Negative for agitation and dysphoric mood.        Objective:     BP 120/70   Pulse 82   Temp 99.1 F (37.3 C)   Resp 16   Ht 5' 7 (1.702 m)   Wt 176 lb 6.4 oz (80 kg)   SpO2 97%   BMI 27.63 kg/m  Wt Readings from Last 3 Encounters:  11/12/23 176 lb 6.4 oz (80 kg)  09/23/23 179 lb (81.2 kg)  08/01/23 176 lb (79.8 kg)    Physical Exam Vitals reviewed.  Constitutional:      General: She is not in acute distress.    Appearance: Normal appearance.  HENT:     Head: Normocephalic and atraumatic.     Right Ear: External ear normal.     Left Ear: External ear normal.  Eyes:     General: No scleral icterus.       Right eye: No discharge.        Left eye: No discharge.     Conjunctiva/sclera: Conjunctivae normal.  Neck:     Thyroid : No thyromegaly.  Cardiovascular:     Rate and Rhythm: Normal rate and regular rhythm.  Pulmonary:     Effort: No respiratory distress.     Breath sounds: Normal breath sounds. No wheezing.     Comments: Increased cough with forced expiration.  Abdominal:     General: Bowel sounds are normal.     Palpations: Abdomen is soft.     Tenderness: There is no abdominal tenderness.  Musculoskeletal:        General: No swelling or tenderness.     Cervical back: Neck supple. No tenderness.  Lymphadenopathy:     Cervical: No cervical adenopathy.  Skin:    Findings: No erythema or rash.  Neurological:     Mental Status: She is alert.  Psychiatric:        Mood and Affect: Mood normal.        Behavior: Behavior normal.  Outpatient Encounter Medications as of 11/12/2023  Medication Sig   acetaminophen  (TYLENOL ) 650 MG CR tablet Take 650 mg by mouth every 8 (eight) hours as needed for pain.   aspirin EC 81 MG tablet Take 81 mg by mouth daily.   atorvastatin  (LIPITOR) 10 MG tablet Take 1 tablet (10 mg total) by mouth daily.   Calcium  Carb-Cholecalciferol  600-10 MG-MCG TABS Take 2 tablets by mouth  daily with breakfast.   cetirizine (ZYRTEC) 10 MG tablet Take by mouth.   Cholecalciferol  (VITAMIN D3) 10 MCG (400 UNIT) tablet Take 1,600 Units by mouth daily.   doxycycline  (VIBRA -TABS) 100 MG tablet Take 1 tablet (100 mg total) by mouth 2 (two) times daily.   enoxaparin (LOVENOX) 40 MG/0.4ML injection Inject 40 mg into the skin daily.   fluconazole  (DIFLUCAN ) 150 MG tablet Take one tablet x 1. If persistent symptoms, then repeat x 1 in 3 days   furosemide  (LASIX ) 20 MG tablet Take 1 tablet (20 mg total) by mouth daily as needed.   Multiple Vitamin (MULTIVITAMIN) tablet Take 1 tablet by mouth daily.   omeprazole  (PRILOSEC) 20 MG capsule Take 1 capsule (20 mg total) by mouth 2 (two) times daily.   polyethylene glycol (MIRALAX / GLYCOLAX) 17 g packet Take 17 g by mouth daily.   predniSONE  (DELTASONE ) 10 MG tablet Take four tablets x 1 day and then decrease by 1/2 tablet per day until down to zero mg.   estradiol  (ESTRACE ) 0.1 MG/GM vaginal cream Estrogen Cream Instruction Discard applicator Apply pea sized amount to tip of finger to urethra before bed. Wash hands well after application. Use Monday, Wednesday and Friday   mupirocin  ointment (BACTROBAN ) 2 % Apply 1 Application topically 2 (two) times daily.   triamcinolone  (NASACORT  ALLERGY 24HR) 55 MCG/ACT AERO nasal inhaler Place 1 spray into the nose daily.   [DISCONTINUED] D-MANNOSE PO Take 1-2 capsules by mouth daily at 6 (six) AM.   [DISCONTINUED] fluconazole  (DIFLUCAN ) 150 MG tablet Take 1 tablet (150 mg total) by mouth every three (3) days as needed for up to 2 doses.   [DISCONTINUED] senna-docusate (SENOKOT-S) 8.6-50 MG per tablet Take by mouth daily. (Patient not taking: Reported on 11/12/2023)   No facility-administered encounter medications on file as of 11/12/2023.     Lab Results  Component Value Date   WBC 9.3 11/12/2023   HGB 10.7 (L) 11/12/2023   HCT 33.0 (L) 11/12/2023   PLT 553.0 (H) 11/12/2023   GLUCOSE 94 11/12/2023    CHOL 180 07/30/2023   TRIG 154.0 (H) 07/30/2023   HDL 50.60 07/30/2023   LDLDIRECT 164.2 02/11/2013   LDLCALC 99 07/30/2023   ALT 13 11/12/2023   AST 19 11/12/2023   NA 142 11/12/2023   K 3.7 11/12/2023   CL 102 11/12/2023   CREATININE 0.67 11/12/2023   BUN 10 11/12/2023   CO2 29 11/12/2023   TSH 1.18 12/27/2022   HGBA1C 5.6 07/30/2023       Assessment & Plan:  Anemia, unspecified type Assessment & Plan: Hgb decreased during hospitalization. Recheck cbc today.   Orders: -     CBC with Differential/Platelet -     IBC + Ferritin  B12 deficiency Assessment & Plan: Check B12 level.   Orders: -     Vitamin B12  Hypercholesterolemia Assessment & Plan: Taking lipitor. Continue low cholesterol diet and exercise. Follow lipid panel. Discussed recent labs.   Lab Results  Component Value Date   CHOL 180 07/30/2023   HDL 50.60 07/30/2023  LDLCALC 99 07/30/2023   LDLDIRECT 164.2 02/11/2013   TRIG 154.0 (H) 07/30/2023   CHOLHDL 4 07/30/2023     Orders: -     Basic metabolic panel with GFR -     Hepatic function panel  Hyperglycemia Assessment & Plan: Follow met b and A1c.    Hypomagnesemia Assessment & Plan: Recheck to confirm wnl. Low in the hospital.   Orders: -     Magnesium   Syncope, unspecified syncope type Assessment & Plan:  Admitted 10/27/23 - 10/31/23 - s/p fall with resulting closed fracture of right hip and syncope. Husband reports prior to episode, she had not eaten much. Feltt sweaty. S/p R THA on 10/28/23. Recommended LMWH x 28 days total (to end 11/26/23). Also recommended 7 day holter monitor at discharge. Regarding syncope, TTE 9/21 - no structural abnormalities, EF 55%. Symptomatic orthostasis noted. 5 beat run of SVT on telementry 10/31/23, otherwise no arrhythmias captured. CT - negative for PE. Recommended adequate hydration and compression stockings. Noted hypoxia during hospitalization. CT PE negative on 9/23. She had brief episode of hypoxia  again on 9/24 with T Max 38.7 - CXR repeated with atelectasis without infiltrates. Procalcitonin negative. UA negative. WBC downtrending to 10.6 on 9/25 from 10.9 on 9/24. She does have reported h/o sleep apnea that was brought on by use of chronic oxycodone  + fentanyl  patch 10-15 years ago for chronic back pain; however has been off narcotics for years, but now restarted with hip fracture. She reports repeat sleep study was negative. Did wear monitor. Results pending. Discussed need for f/u with cardiology and possibly neuro- pending cardiology w/up. Continue PT.    Non-Hodgkin's lymphoma, unspecified body region, unspecified non-Hodgkin lymphoma type [facility]) Assessment & Plan: Has been followed by Dr DeCastro.  Stable.  Has been following up yearly.  Has been released.    Cough, unspecified type Assessment & Plan: Increased cough and congestion as outlined. Coughing fits. Exam as outlined. Treat with doxycycline  and prednisone  taper as directed. Robitussin DM or delsym prn. Request to have diflucan  if needed. Call with update.    Closed fracture of right hip with routine healing, subsequent encounter Assessment & Plan: S/p THA 10/28/23. Continue therapy. Continue LMWH until 11/26/23. F/u with ortho.    Other orders -     predniSONE ; Take four tablets x 1 day and then decrease by 1/2 tablet per day until down to zero mg.  Dispense: 18 tablet; Refill: 0 -     Doxycycline  Hyclate; Take 1 tablet (100 mg total) by mouth 2 (two) times daily.  Dispense: 14 tablet; Refill: 0 -     Fluconazole ; Take one tablet x 1. If persistent symptoms, then repeat x 1 in 3 days  Dispense: 2 tablet; Refill: 0     Allena Hamilton, MD

## 2023-11-13 ENCOUNTER — Ambulatory Visit: Payer: Self-pay | Admitting: Internal Medicine

## 2023-11-13 DIAGNOSIS — R7989 Other specified abnormal findings of blood chemistry: Secondary | ICD-10-CM

## 2023-11-14 DIAGNOSIS — Z471 Aftercare following joint replacement surgery: Secondary | ICD-10-CM | POA: Diagnosis not present

## 2023-11-14 DIAGNOSIS — Z96641 Presence of right artificial hip joint: Secondary | ICD-10-CM | POA: Diagnosis not present

## 2023-11-15 DIAGNOSIS — M1990 Unspecified osteoarthritis, unspecified site: Secondary | ICD-10-CM | POA: Diagnosis not present

## 2023-11-15 DIAGNOSIS — F32A Depression, unspecified: Secondary | ICD-10-CM | POA: Diagnosis not present

## 2023-11-15 DIAGNOSIS — M80051D Age-related osteoporosis with current pathological fracture, right femur, subsequent encounter for fracture with routine healing: Secondary | ICD-10-CM | POA: Diagnosis not present

## 2023-11-15 DIAGNOSIS — C8238 Follicular lymphoma grade IIIa, lymph nodes of multiple sites: Secondary | ICD-10-CM | POA: Diagnosis not present

## 2023-11-15 DIAGNOSIS — R55 Syncope and collapse: Secondary | ICD-10-CM | POA: Diagnosis not present

## 2023-11-15 DIAGNOSIS — G8929 Other chronic pain: Secondary | ICD-10-CM | POA: Diagnosis not present

## 2023-11-15 DIAGNOSIS — M797 Fibromyalgia: Secondary | ICD-10-CM | POA: Diagnosis not present

## 2023-11-17 ENCOUNTER — Encounter: Payer: Self-pay | Admitting: Internal Medicine

## 2023-11-17 DIAGNOSIS — R55 Syncope and collapse: Secondary | ICD-10-CM | POA: Insufficient documentation

## 2023-11-17 DIAGNOSIS — S72009A Fracture of unspecified part of neck of unspecified femur, initial encounter for closed fracture: Secondary | ICD-10-CM | POA: Insufficient documentation

## 2023-11-17 NOTE — Assessment & Plan Note (Signed)
 Recheck to confirm wnl. Low in the hospital.

## 2023-11-17 NOTE — Assessment & Plan Note (Signed)
 S/p THA 10/28/23. Continue therapy. Continue LMWH until 11/26/23. F/u with ortho.

## 2023-11-17 NOTE — Assessment & Plan Note (Signed)
 Has been followed by Dr James Ivanoff.  Stable.  Has been following up yearly.  Has been released.

## 2023-11-17 NOTE — Assessment & Plan Note (Signed)
 Increased cough and congestion as outlined. Coughing fits. Exam as outlined. Treat with doxycycline  and prednisone  taper as directed. Robitussin DM or delsym prn. Request to have diflucan  if needed. Call with update.

## 2023-11-17 NOTE — Assessment & Plan Note (Signed)
 Follow met b and A1c.

## 2023-11-17 NOTE — Assessment & Plan Note (Signed)
 Admitted 10/27/23 - 10/31/23 - s/p fall with resulting closed fracture of right hip and syncope. Husband reports prior to episode, she had not eaten much. Feltt sweaty. S/p R THA on 10/28/23. Recommended LMWH x 28 days total (to end 11/26/23). Also recommended 7 day holter monitor at discharge. Regarding syncope, TTE 9/21 - no structural abnormalities, EF 55%. Symptomatic orthostasis noted. 5 beat run of SVT on telementry 10/31/23, otherwise no arrhythmias captured. CT - negative for PE. Recommended adequate hydration and compression stockings. Noted hypoxia during hospitalization. CT PE negative on 9/23. She had brief episode of hypoxia again on 9/24 with T Max 38.7 - CXR repeated with atelectasis without infiltrates. Procalcitonin negative. UA negative. WBC downtrending to 10.6 on 9/25 from 10.9 on 9/24. She does have reported h/o sleep apnea that was brought on by use of chronic oxycodone  + fentanyl  patch 10-15 years ago for chronic back pain; however has been off narcotics for years, but now restarted with hip fracture. She reports repeat sleep study was negative. Did wear monitor. Results pending. Discussed need for f/u with cardiology and possibly neuro- pending cardiology w/up. Continue PT.

## 2023-11-17 NOTE — Assessment & Plan Note (Signed)
 Check B12 level.

## 2023-11-17 NOTE — Assessment & Plan Note (Signed)
 Taking lipitor. Continue low cholesterol diet and exercise. Follow lipid panel. Discussed recent labs.   Lab Results  Component Value Date   CHOL 180 07/30/2023   HDL 50.60 07/30/2023   LDLCALC 99 07/30/2023   LDLDIRECT 164.2 02/11/2013   TRIG 154.0 (H) 07/30/2023   CHOLHDL 4 07/30/2023

## 2023-11-17 NOTE — Assessment & Plan Note (Signed)
 Hgb decreased during hospitalization. Recheck cbc today.

## 2023-11-18 DIAGNOSIS — M25551 Pain in right hip: Secondary | ICD-10-CM | POA: Diagnosis not present

## 2023-11-18 DIAGNOSIS — R600 Localized edema: Secondary | ICD-10-CM | POA: Diagnosis not present

## 2023-11-20 DIAGNOSIS — M25551 Pain in right hip: Secondary | ICD-10-CM | POA: Diagnosis not present

## 2023-11-20 DIAGNOSIS — R269 Unspecified abnormalities of gait and mobility: Secondary | ICD-10-CM | POA: Diagnosis not present

## 2023-11-21 ENCOUNTER — Encounter: Payer: Self-pay | Admitting: Internal Medicine

## 2023-11-27 DIAGNOSIS — R269 Unspecified abnormalities of gait and mobility: Secondary | ICD-10-CM | POA: Diagnosis not present

## 2023-11-27 DIAGNOSIS — M25551 Pain in right hip: Secondary | ICD-10-CM | POA: Diagnosis not present

## 2023-11-29 DIAGNOSIS — M25551 Pain in right hip: Secondary | ICD-10-CM | POA: Diagnosis not present

## 2023-11-29 DIAGNOSIS — R269 Unspecified abnormalities of gait and mobility: Secondary | ICD-10-CM | POA: Diagnosis not present

## 2023-12-02 ENCOUNTER — Ambulatory Visit: Admitting: Urology

## 2023-12-02 ENCOUNTER — Telehealth: Payer: Self-pay

## 2023-12-02 NOTE — Telephone Encounter (Signed)
Signed and placed in folder.

## 2023-12-02 NOTE — Telephone Encounter (Signed)
 Medical Services form placed in provider to be signed folder

## 2023-12-03 NOTE — Telephone Encounter (Signed)
 Noted

## 2023-12-04 DIAGNOSIS — M25551 Pain in right hip: Secondary | ICD-10-CM | POA: Diagnosis not present

## 2023-12-04 DIAGNOSIS — R269 Unspecified abnormalities of gait and mobility: Secondary | ICD-10-CM | POA: Diagnosis not present

## 2023-12-06 DIAGNOSIS — R269 Unspecified abnormalities of gait and mobility: Secondary | ICD-10-CM | POA: Diagnosis not present

## 2023-12-06 DIAGNOSIS — M25551 Pain in right hip: Secondary | ICD-10-CM | POA: Diagnosis not present

## 2023-12-10 ENCOUNTER — Telehealth: Payer: Self-pay

## 2023-12-10 ENCOUNTER — Other Ambulatory Visit (INDEPENDENT_AMBULATORY_CARE_PROVIDER_SITE_OTHER)

## 2023-12-10 DIAGNOSIS — C8238 Follicular lymphoma grade IIIa, lymph nodes of multiple sites: Secondary | ICD-10-CM | POA: Diagnosis not present

## 2023-12-10 DIAGNOSIS — D509 Iron deficiency anemia, unspecified: Secondary | ICD-10-CM | POA: Diagnosis not present

## 2023-12-10 DIAGNOSIS — M1990 Unspecified osteoarthritis, unspecified site: Secondary | ICD-10-CM | POA: Diagnosis not present

## 2023-12-10 DIAGNOSIS — M80051D Age-related osteoporosis with current pathological fracture, right femur, subsequent encounter for fracture with routine healing: Secondary | ICD-10-CM | POA: Diagnosis not present

## 2023-12-10 DIAGNOSIS — K219 Gastro-esophageal reflux disease without esophagitis: Secondary | ICD-10-CM | POA: Diagnosis not present

## 2023-12-10 DIAGNOSIS — G8929 Other chronic pain: Secondary | ICD-10-CM | POA: Diagnosis not present

## 2023-12-10 DIAGNOSIS — G4730 Sleep apnea, unspecified: Secondary | ICD-10-CM | POA: Diagnosis not present

## 2023-12-10 DIAGNOSIS — K5790 Diverticulosis of intestine, part unspecified, without perforation or abscess without bleeding: Secondary | ICD-10-CM | POA: Diagnosis not present

## 2023-12-10 DIAGNOSIS — R7989 Other specified abnormal findings of blood chemistry: Secondary | ICD-10-CM

## 2023-12-10 DIAGNOSIS — M797 Fibromyalgia: Secondary | ICD-10-CM | POA: Diagnosis not present

## 2023-12-10 DIAGNOSIS — K222 Esophageal obstruction: Secondary | ICD-10-CM | POA: Diagnosis not present

## 2023-12-10 DIAGNOSIS — F32A Depression, unspecified: Secondary | ICD-10-CM | POA: Diagnosis not present

## 2023-12-10 DIAGNOSIS — K589 Irritable bowel syndrome without diarrhea: Secondary | ICD-10-CM | POA: Diagnosis not present

## 2023-12-10 LAB — CBC WITH DIFFERENTIAL/PLATELET
Basophils Absolute: 0 K/uL (ref 0.0–0.1)
Basophils Relative: 0.6 % (ref 0.0–3.0)
Eosinophils Absolute: 0.2 K/uL (ref 0.0–0.7)
Eosinophils Relative: 2.9 % (ref 0.0–5.0)
HCT: 35.9 % — ABNORMAL LOW (ref 36.0–46.0)
Hemoglobin: 11.8 g/dL — ABNORMAL LOW (ref 12.0–15.0)
Lymphocytes Relative: 40 % (ref 12.0–46.0)
Lymphs Abs: 2.9 K/uL (ref 0.7–4.0)
MCHC: 32.8 g/dL (ref 30.0–36.0)
MCV: 84.8 fl (ref 78.0–100.0)
Monocytes Absolute: 0.5 K/uL (ref 0.1–1.0)
Monocytes Relative: 7.2 % (ref 3.0–12.0)
Neutro Abs: 3.6 K/uL (ref 1.4–7.7)
Neutrophils Relative %: 49.3 % (ref 43.0–77.0)
Platelets: 264 K/uL (ref 150.0–400.0)
RBC: 4.24 Mil/uL (ref 3.87–5.11)
RDW: 16.4 % — ABNORMAL HIGH (ref 11.5–15.5)
WBC: 7.2 K/uL (ref 4.0–10.5)

## 2023-12-10 NOTE — Telephone Encounter (Signed)
 From faxed and placed in red folder for front office

## 2023-12-10 NOTE — Telephone Encounter (Signed)
 Signed and placed in box.

## 2023-12-10 NOTE — Telephone Encounter (Signed)
 Received home health certification, placed in folder for review

## 2023-12-11 ENCOUNTER — Ambulatory Visit: Payer: Self-pay | Admitting: Internal Medicine

## 2023-12-11 DIAGNOSIS — R269 Unspecified abnormalities of gait and mobility: Secondary | ICD-10-CM | POA: Diagnosis not present

## 2023-12-11 DIAGNOSIS — M25551 Pain in right hip: Secondary | ICD-10-CM | POA: Diagnosis not present

## 2023-12-13 DIAGNOSIS — R269 Unspecified abnormalities of gait and mobility: Secondary | ICD-10-CM | POA: Diagnosis not present

## 2023-12-13 DIAGNOSIS — M25551 Pain in right hip: Secondary | ICD-10-CM | POA: Diagnosis not present

## 2023-12-16 ENCOUNTER — Ambulatory Visit (INDEPENDENT_AMBULATORY_CARE_PROVIDER_SITE_OTHER): Admitting: Urology

## 2023-12-16 VITALS — BP 129/75 | HR 92 | Ht 67.0 in | Wt 177.0 lb

## 2023-12-16 DIAGNOSIS — N390 Urinary tract infection, site not specified: Secondary | ICD-10-CM

## 2023-12-16 DIAGNOSIS — N393 Stress incontinence (female) (male): Secondary | ICD-10-CM

## 2023-12-16 LAB — URINALYSIS, COMPLETE
Bilirubin, UA: NEGATIVE
Glucose, UA: NEGATIVE
Ketones, UA: NEGATIVE
Leukocytes,UA: NEGATIVE
Nitrite, UA: NEGATIVE
Protein,UA: NEGATIVE
RBC, UA: NEGATIVE
Specific Gravity, UA: 1.015 (ref 1.005–1.030)
Urobilinogen, Ur: 0.2 mg/dL (ref 0.2–1.0)
pH, UA: 6 (ref 5.0–7.5)

## 2023-12-16 LAB — MICROSCOPIC EXAMINATION: Bacteria, UA: NONE SEEN

## 2023-12-16 MED ORDER — SULFAMETHOXAZOLE-TRIMETHOPRIM 800-160 MG PO TABS
1.0000 | ORAL_TABLET | Freq: Once | ORAL | Status: AC
  Administered 2023-12-16: 1 via ORAL

## 2023-12-16 MED ORDER — GEMTESA 75 MG PO TABS: 75.0000 mg | ORAL_TABLET | Freq: Every day | ORAL | Status: DC

## 2023-12-16 NOTE — Progress Notes (Signed)
 12/16/2023 8:44 AM   Maria Keller [birth year] [id number]  Referring provider: Glendia Shad, MD [address] [place],  [place] [address]  Chief Complaint  Patient presents with   Cysto    HPI: Dr. Penne: Recurrent urinary tract infections.  Previous Layman Haff surgery for incontinence.  Behavioral therapies discussed including estrogen cream.   Today Patient reports urge incontinence.  She leaks with coughing sneezing sometimes bending lifting.  Primary symptom is leaking when she goes from a sitting to standing position.  She wears 6 heavy pads a day damp to moderately wet.   Voids every 2 hours and 2-3 times at night.  Flow is fair   Has had open bladder surgery and another bladder suspension.  Has had a hysterectomy.   Takes estrogen cream 3 times a week and cranberry   On pelvic examination patient had excellent support of bladder neck.  Vaginal length approximately 6 cm.  No cystocele or rectocele.  Negative cough test with modest cough    Patient has mixed incontinence. She primarily leaks when she goes from sitting to standing position. She has had previous bladder surgery. She will return for urodynamics and cystoscopy and proceed accordingly   Today Frequency stable.  Incontinence stable.  Patient had 1 positive urine culture and 1 negative urine culture in the last few months.  It occurs soon after the urodynamics.  She fell without a diagnosis at [facility] and had a fractured hip and needed surgery  On pelvic examination she had a well supported bladder neck with moderate atrophy and no anterior defect.  She has a small rectocele.  Negative cough test with modest cough after cystoscopy  Patient underwent flexible cystoscopy.  Bladder mucosa and trigone were normal.  No cystitis.  No carcinoma.  No foreign body.  Increased vascularity noted  On urodynamics patient did not void and was catheterized for a few milliliters.  Maximum bladder  capacity was 501 mL.  Bladder was unstable reaching pressure of 35 cm of water associated with urgency and urgency incontinence.  At 600 mL her leak point pressure is 30 cm of water with mild to moderate leakage and her Valsalva leak point pressure 52 cm of water with mild to moderate leakage.  It appears she was generating much higher pressures earlier and bladder filling but was not leaking.  It was noted that she only has stress incontinence when her bladder was full  during voluntary voiding she voided 268 mL.  Maximal flow 7 mL/s.  Voiding pressure 10 cm of water.  Residual 232 mL.  EMG activity normal.  Bladder neck distended to 1 cm.     PMH: Past Medical History:  Diagnosis Date   Anemia    iron deficiency and B12 deficiency   Arthritis    Bronchitis 08/2015   Chronic back pain    followed by Dr Dannial   Depression    Diverticulosis    Esophageal stricture    requiring dilatation x 2   Fibromyalgia    Fibromyalgia    Fibromyalgia    Follicular lymphoma (HCC)    Followed by Dr DeCastro, s/p chemo and XRT   Follicular lymphoma (HCC)    GERD (gastroesophageal reflux disease)    GERD (gastroesophageal reflux disease)    Hemorrhoid    Hiatal hernia    History of kidney stones    Hypercholesterolemia    IBS (irritable bowel syndrome)    IBS (irritable bowel syndrome)    Nephrolithiasis  followed by Dr Twylla, s/p stents   PONV (postoperative nausea and vomiting)    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   Shingles outbreak 11/26/2011   Sleep apnea     Surgical History: Past Surgical History:  Procedure Laterality Date   APPENDECTOMY     BIOPSY  11/05/2022   Procedure: BIOPSY;  Surgeon: Maryruth Ole DASEN, MD;  Location: [facility] ENDOSCOPY;  Service: Endoscopy;;   BLADDER SURGERY  1993   BREAST BIOPSY N/A    capsule endoscopy     CHOLECYSTECTOMY  1995   COLONOSCOPY WITH PROPOFOL  N/A 06/19/2017   Procedure: COLONOSCOPY WITH  PROPOFOL ;  Surgeon: Viktoria Lamar DASEN, MD;  Location: [facility] ENDOSCOPY;  Service: Endoscopy;  Laterality: N/A;   COLONOSCOPY WITH PROPOFOL  N/A 11/05/2022   Procedure: COLONOSCOPY WITH PROPOFOL ;  Surgeon: Maryruth Ole DASEN, MD;  Location: [facility] ENDOSCOPY;  Service: Endoscopy;  Laterality: N/A;   ESOPHAGOGASTRODUODENOSCOPY (EGD) WITH PROPOFOL  N/A 06/19/2017   Procedure: ESOPHAGOGASTRODUODENOSCOPY (EGD) WITH PROPOFOL ;  Surgeon: Viktoria Lamar DASEN, MD;  Location: [facility] ENDOSCOPY;  Service: Endoscopy;  Laterality: N/A;   ESOPHAGOGASTRODUODENOSCOPY (EGD) WITH PROPOFOL  N/A 11/05/2022   Procedure: ESOPHAGOGASTRODUODENOSCOPY (EGD) WITH PROPOFOL ;  Surgeon: Maryruth Ole DASEN, MD;  Location: [facility] ENDOSCOPY;  Service: Endoscopy;  Laterality: N/A;   EYE SURGERY     both cataract remove   FRACTURE SURGERY Left    ankle, plate with 8 screws   history of helicobacter pylori infection     JOINT REPLACEMENT Right    knee   LEFT OOPHORECTOMY     LEG SURGERY Left    ORIF ANKLE FRACTURE  2012   OVARIAN CYST REMOVAL     right   POLYPECTOMY  11/05/2022   Procedure: POLYPECTOMY;  Surgeon: Maryruth Ole DASEN, MD;  Location: [facility] ENDOSCOPY;  Service: Endoscopy;;   RECTOCELE REPAIR N/A    REPLACEMENT TOTAL KNEE Right 03/19/2016   TONSILECTOMY/ADENOIDECTOMY WITH MYRINGOTOMY  1970   TONSILLECTOMY N/A    TOTAL KNEE REVISION Right 11/07/2021   Procedure: TOTAL KNEE REVISION;  Surgeon: Edie Norleen PARAS, MD;  Location: [facility] ORS;  Service: Orthopedics;  Laterality: Right;   TUBAL LIGATION  1976   VAGINAL HYSTERECTOMY  1980's   secondary to bleeding    Home Medications:  Allergies as of 12/16/2023       Reactions   Oxycontin  [oxycodone  Hcl] Itching   Sleep Apnea   Penicillins Anaphylaxis, Itching   Chloral Hydrate Hives   Other Reaction: Not Assessed   Ciprofloxacin Itching   Colace [docusate Calcium ]    Docusate Sodium     Methotrexate And Trimetrexate Other (See Comments)   Questionable   Parafon Forte Dsc  [chlorzoxazone] Itching, Other (See Comments)   Other Reaction: Not Assessed   Penicillin G Hives   Tape Other (See Comments)   adhesive  tape cause redness and skin to peel   Bextra [valdecoxib] Itching   Other reaction(s): Other (See Comments) Other Reaction: Not Assessed   Oxycodone  Itching   Rofecoxib Itching   Other reaction(s): Headache (finding)        Medication List        Accurate as of December 16, 2023  8:44 AM. If you have any questions, ask your nurse or doctor.          STOP taking these medications    Calcium  Carb-Cholecalciferol  600-10 MG-MCG Tabs   doxycycline  100 MG tablet Commonly known as: VIBRA -TABS   fluconazole  150 MG tablet Commonly known as:  Diflucan    predniSONE  10 MG tablet Commonly known as: DELTASONE    Vitamin D3 10 MCG (400 UNIT) tablet       TAKE these medications    acetaminophen  650 MG CR tablet Commonly known as: TYLENOL  Take 650 mg by mouth every 8 (eight) hours as needed for pain.   aspirin EC 81 MG tablet Take 81 mg by mouth daily.   atorvastatin  10 MG tablet Commonly known as: LIPITOR Take 1 tablet (10 mg total) by mouth daily.   benzonatate  100 MG capsule Commonly known as: TESSALON  TAKE 1 CAPSULE BY MOUTH THREE TIMES A DAY AS NEEDED FOR COUGH   cetirizine 10 MG tablet Commonly known as: ZYRTEC Take by mouth.   estradiol  0.1 MG/GM vaginal cream Commonly known as: ESTRACE  Estrogen Cream Instruction Discard applicator Apply pea sized amount to tip of finger to urethra before bed. Wash hands well after application. Use Monday, Wednesday and Friday   furosemide  20 MG tablet Commonly known as: LASIX  Take 1 tablet (20 mg total) by mouth daily as needed.   multivitamin tablet Take 1 tablet by mouth daily.   mupirocin  ointment 2 % Commonly known as: BACTROBAN  Apply 1 Application topically 2 (two) times daily.   Nasacort  Allergy 24HR 55 MCG/ACT Aero nasal inhaler Generic drug: triamcinolone  Place 1 spray  into the nose daily.   omeprazole  20 MG capsule Commonly known as: PRILOSEC Take 1 capsule (20 mg total) by mouth 2 (two) times daily.   polyethylene glycol 17 g packet Commonly known as: MIRALAX / GLYCOLAX Take 17 g by mouth daily.        Allergies:  Allergies  Allergen Reactions   Oxycontin  [Oxycodone  Hcl] Itching    Sleep Apnea   Penicillins Anaphylaxis and Itching   Chloral Hydrate Hives    Other Reaction: Not Assessed   Ciprofloxacin Itching   Colace [Docusate Calcium ]    Docusate Sodium     Methotrexate And Trimetrexate Other (See Comments)    Questionable    Parafon Forte Dsc [Chlorzoxazone] Itching and Other (See Comments)    Other Reaction: Not Assessed   Penicillin G Hives   Tape Other (See Comments)    adhesive  tape cause redness and skin to peel   Bextra [Valdecoxib] Itching    Other reaction(s): Other (See Comments) Other Reaction: Not Assessed   Oxycodone  Itching   Rofecoxib Itching    Other reaction(s): Headache (finding)    Family History: Family History  Problem Relation Age of Onset   Ulcers Mother    Ulcers Father    Breast cancer Other        great aunt and grandfather   Lung cancer Other        uncle   Hypertension Maternal Grandmother    Heart disease Maternal Grandmother        MI 10s   Diabetes Maternal Grandmother    Arthritis Maternal Grandmother    Diabetes Maternal Grandfather     Social History:  reports that she has never smoked. She has never used smokeless tobacco. She reports that she does not drink alcohol and does not use drugs.  ROS:                                        Physical Exam: BP 129/75   Pulse 92   Ht 5' 7 (1.702 m)   Wt 80.3 kg   BMI  27.72 kg/m   Constitutional:  Alert and oriented, No acute distress. HEENT: [place] AT, moist mucus membranes.  Trachea midline, no masses.   Laboratory Data: Lab Results  Component Value Date   WBC 7.2 12/10/2023   HGB 11.8 (L) 12/10/2023    HCT 35.9 (L) 12/10/2023   MCV 84.8 12/10/2023   PLT 264.0 12/10/2023    Lab Results  Component Value Date   CREATININE 0.67 11/12/2023    No results found for: PSA  No results found for: TESTOSTERONE  Lab Results  Component Value Date   HGBA1C 5.6 07/30/2023    Urinalysis    Component Value Date/Time   COLORURINE YELLOW 05/03/2023 1037   APPEARANCEUR Slightly cloudy 09/23/2023 1420   LABSPEC 1.015 05/03/2023 1037   PHURINE 6.0 05/03/2023 1037   GLUCOSEU Negative 09/23/2023 1420   GLUCOSEU NEGATIVE 05/03/2023 1037   HGBUR LARGE (A) 05/03/2023 1037   BILIRUBINUR negative 10/18/2023 0942   BILIRUBINUR Negative 09/23/2023 1420   KETONESUR negative 10/18/2023 0942   KETONESUR NEGATIVE 05/03/2023 1037   PROTEINUR Negative 09/23/2023 1420   PROTEINUR 30 (A) 10/26/2021 1132   UROBILINOGEN 0.2 10/18/2023 0942   UROBILINOGEN 0.2 05/03/2023 1037   NITRITE Negative 10/18/2023 0942   NITRITE Negative 09/23/2023 1420   NITRITE NEGATIVE 05/03/2023 1037   LEUKOCYTESUR Small (1+) (A) 10/18/2023 0942   LEUKOCYTESUR Negative 09/23/2023 1420   LEUKOCYTESUR LARGE (A) 05/03/2023 1037    Pertinent Imaging: Urine reviewed  Assessment & Plan: Patient has mixed incontinence but in my opinion 80% of the problem is an overactive bladder and she will treat it with the OAB treatment pathway.  A bulking agent is a distant option I do not think she should have a sling.  She had lower leak point pressures at high volume but otherwise has an OAB presentation.  Reassess with Gemtesa samples and prescription in 6 weeks.  At patient request I gave her 3 days of Macrobid  because she is prone to get UTIs with invasive procedure  1. Primary stress urinary incontinence (Primary)  - Urinalysis, Complete   No follow-ups on file.  Glendia DELENA Elizabeth, MD  [facility] [address], [address] [place], [place] [address] [phone]

## 2023-12-18 DIAGNOSIS — M25551 Pain in right hip: Secondary | ICD-10-CM | POA: Diagnosis not present

## 2023-12-18 DIAGNOSIS — R269 Unspecified abnormalities of gait and mobility: Secondary | ICD-10-CM | POA: Diagnosis not present

## 2023-12-20 DIAGNOSIS — M25551 Pain in right hip: Secondary | ICD-10-CM | POA: Diagnosis not present

## 2023-12-20 DIAGNOSIS — R269 Unspecified abnormalities of gait and mobility: Secondary | ICD-10-CM | POA: Diagnosis not present

## 2023-12-20 LAB — CULTURE, URINE COMPREHENSIVE

## 2023-12-24 DIAGNOSIS — M25551 Pain in right hip: Secondary | ICD-10-CM | POA: Diagnosis not present

## 2023-12-24 DIAGNOSIS — R269 Unspecified abnormalities of gait and mobility: Secondary | ICD-10-CM | POA: Diagnosis not present

## 2023-12-25 DIAGNOSIS — Z471 Aftercare following joint replacement surgery: Secondary | ICD-10-CM | POA: Diagnosis not present

## 2023-12-25 DIAGNOSIS — Z96641 Presence of right artificial hip joint: Secondary | ICD-10-CM | POA: Diagnosis not present

## 2023-12-26 DIAGNOSIS — M25551 Pain in right hip: Secondary | ICD-10-CM | POA: Diagnosis not present

## 2023-12-26 DIAGNOSIS — R269 Unspecified abnormalities of gait and mobility: Secondary | ICD-10-CM | POA: Diagnosis not present

## 2023-12-28 ENCOUNTER — Other Ambulatory Visit: Payer: Self-pay | Admitting: Internal Medicine

## 2023-12-30 ENCOUNTER — Telehealth: Payer: Self-pay | Admitting: Urology

## 2023-12-30 NOTE — Telephone Encounter (Signed)
 Patient called and stated that Gemtesa  prescription would be over $500.00, and she can't afford this. She is asking for alternative for Gemtesa . Please call patient to advise.

## 2023-12-31 DIAGNOSIS — R269 Unspecified abnormalities of gait and mobility: Secondary | ICD-10-CM | POA: Diagnosis not present

## 2023-12-31 DIAGNOSIS — M25551 Pain in right hip: Secondary | ICD-10-CM | POA: Diagnosis not present

## 2024-01-06 ENCOUNTER — Encounter: Payer: Self-pay | Admitting: Urology

## 2024-01-06 MED ORDER — OXYBUTYNIN CHLORIDE ER 10 MG PO TB24: 10.0000 mg | ORAL_TABLET | Freq: Every day | ORAL | 11 refills | Status: AC

## 2024-01-06 NOTE — Telephone Encounter (Signed)
 Informed DTaP via mychart

## 2024-01-07 ENCOUNTER — Ambulatory Visit

## 2024-01-07 DIAGNOSIS — R269 Unspecified abnormalities of gait and mobility: Secondary | ICD-10-CM | POA: Diagnosis not present

## 2024-01-07 DIAGNOSIS — M25551 Pain in right hip: Secondary | ICD-10-CM | POA: Diagnosis not present

## 2024-01-09 DIAGNOSIS — R269 Unspecified abnormalities of gait and mobility: Secondary | ICD-10-CM | POA: Diagnosis not present

## 2024-01-09 DIAGNOSIS — M25551 Pain in right hip: Secondary | ICD-10-CM | POA: Diagnosis not present

## 2024-01-13 ENCOUNTER — Ambulatory Visit: Admitting: *Deleted

## 2024-01-13 VITALS — Ht 67.0 in | Wt 177.0 lb

## 2024-01-13 DIAGNOSIS — Z0000 Encounter for general adult medical examination without abnormal findings: Secondary | ICD-10-CM | POA: Diagnosis not present

## 2024-01-13 DIAGNOSIS — G8929 Other chronic pain: Secondary | ICD-10-CM | POA: Diagnosis not present

## 2024-01-13 DIAGNOSIS — M48061 Spinal stenosis, lumbar region without neurogenic claudication: Secondary | ICD-10-CM | POA: Diagnosis not present

## 2024-01-13 DIAGNOSIS — M545 Low back pain, unspecified: Secondary | ICD-10-CM | POA: Diagnosis not present

## 2024-01-13 DIAGNOSIS — M4726 Other spondylosis with radiculopathy, lumbar region: Secondary | ICD-10-CM | POA: Diagnosis not present

## 2024-01-13 NOTE — Patient Instructions (Signed)
 [name],  Thank you for taking the time for your Medicare Wellness Visit. I appreciate your continued commitment to your health goals. Please review the care plan we discussed, and feel free to reach out if I can assist you further.  Please note that Annual Wellness Visits do not include a physical exam. Some assessments may be limited, especially if the visit was conducted virtually. If needed, we may recommend an in-person follow-up with your provider.  Ongoing Care Seeing your primary care provider every 3 to 6 months helps us  monitor your health and provide consistent, personalized care.  Remember to get your flu vaccine.  Referrals If a referral was made during today's visit and you haven't received any updates within two weeks, please contact the referred provider directly to check on the status.  Recommended Screenings:  Health Maintenance  Topic Date Due   COVID-19 Vaccine (3 - Moderna risk series) 05/18/2019   Flu Shot  05/05/2024*   Medicare Annual Wellness Visit  01/12/2025   Colon Cancer Screening  11/05/2027   DTaP/Tdap/Td vaccine (2 - Td or Tdap) 10/15/2028   Pneumococcal Vaccine for age over 53  Completed   Osteoporosis screening with Bone Density Scan  Completed   Hepatitis C Screening  Completed   Zoster (Shingles) Vaccine  Completed   Meningitis B Vaccine  Aged Out   Breast Cancer Screening  Discontinued  *Topic was postponed. The date shown is not the original due date.       01/13/2024   11:38 AM  Advanced Directives  Does Patient Have a Medical Advance Directive? Yes  Type of Estate Agent of [place];Living will  Does patient want to make changes to medical advance directive? No - Patient declined  Copy of Healthcare Power of Attorney in Chart? No - copy requested    Vision: Annual vision screenings are recommended for early detection of glaucoma, cataracts, and diabetic retinopathy. These exams can also reveal signs of chronic  conditions such as diabetes and high blood pressure.  Dental: Annual dental screenings help detect early signs of oral cancer, gum disease, and other conditions linked to overall health, including heart disease and diabetes.  Please see the attached documents for additional preventive care recommendations.

## 2024-01-13 NOTE — Progress Notes (Signed)
 Chief Complaint  Patient presents with   Medicare Wellness     Subjective:   Maria Keller is a 73 y.o. female who presents for a Medicare Annual Wellness Visit.  Visit info / Clinical Intake: Medicare Wellness Visit Type:: Subsequent Annual Wellness Visit Persons participating in visit and providing information:: patient Medicare Wellness Visit Mode:: Telephone If telephone:: video declined Since this visit was completed virtually, some vitals may be partially provided or unavailable. Missing vitals are due to the limitations of the virtual format.: Unable to obtain vitals - no equipment If Telephone or Video please confirm:: I connected with patient using audio/video enable telemedicine. I verified patient identity with two identifiers, discussed telehealth limitations, and patient agreed to proceed. Patient Location:: Riding in car Provider Location:: Office/Home Interpreter Needed?: No Pre-visit prep was completed: yes AWV questionnaire completed by patient prior to visit?: yes Date:: 01/09/24 Living arrangements:: lives with spouse/significant other Patient's Overall Health Status Rating: good Typical amount of pain: some (hip pain due to hip replacement, nothing new) Does pain affect daily life?: (!) yes Are you currently prescribed opioids?: no  Dietary Habits and Nutritional Risks How many meals a day?: 2 Eats fruit and vegetables daily?: yes Most meals are obtained by: preparing own meals In the last 2 weeks, have you had any of the following?: none Diabetic:: no  Functional Status Activities of Daily Living (to include ambulation/medication): Independent Ambulation: Independent with device- listed below Home Assistive Devices/Equipment: Cane Medication Administration: Independent Home Management (perform basic housework or laundry): Independent Manage your own finances?: yes Primary transportation is: driving Concerns about vision?: no *vision screening is  required for WTM* Concerns about hearing?: no  Fall Screening Falls in the past year?: 1 Number of falls in past year: 0 Was there an injury with Fall?: 1 Fall Risk Category Calculator: 2 Patient Fall Risk Level: Moderate Fall Risk  Fall Risk Patient at Risk for Falls Due to: Mental status change (passed out) Fall risk Follow up: Falls evaluation completed; Falls prevention discussed  Home and Transportation Safety: All rugs have non-skid backing?: yes All stairs or steps have railings?: (!) no (working on getting handrails) Grab bars in the bathtub or shower?: yes Have non-skid surface in bathtub or shower?: yes Good home lighting?: yes Regular seat belt use?: yes Hospital stays in the last year:: (!) yes How many hospital stays:: 1 Reason: Hip replacement  Cognitive Assessment Difficulty concentrating, remembering, or making decisions? : no Will 6CIT or Mini Cog be Completed: yes What year is it?: 0 points What month is it?: 0 points Give patient an address phrase to remember (5 components): [address] [place] About what time is it?: 0 points Count backwards from 20 to 1: 0 points Say the months of the year in reverse: 0 points Repeat the address phrase from earlier: 0 points 6 CIT Score: 0 points  Advance Directives (For Healthcare) Does Patient Have a Medical Advance Directive?: Yes Does patient want to make changes to medical advance directive?: No - Patient declined Type of Advance Directive: Healthcare Power of [place]; Living will Copy of Healthcare Power of Attorney in Chart?: No - copy requested Copy of Living Will in Chart?: No - copy requested  Reviewed/Updated  Reviewed/Updated: Reviewed All (Medical, Surgical, Family, Medications, Allergies, Care Teams, Patient Goals)    Allergies (verified) Oxycontin  [oxycodone  hcl], Penicillins, Chloral hydrate, Ciprofloxacin, Colace [docusate calcium ], Docusate sodium , Methotrexate and trimetrexate,  Parafon forte dsc [chlorzoxazone], Penicillin g, Tape, Bextra [valdecoxib], Oxycodone ,  and Rofecoxib   Current Medications (verified) Outpatient Encounter Medications as of 01/13/2024  Medication Sig   acetaminophen  (TYLENOL ) 650 MG CR tablet Take 650 mg by mouth every 8 (eight) hours as needed for pain.   aspirin EC 81 MG tablet Take 81 mg by mouth daily.   atorvastatin  (LIPITOR) 10 MG tablet Take 1 tablet (10 mg total) by mouth daily.   cetirizine (ZYRTEC) 10 MG tablet Take by mouth.   estradiol  (ESTRACE ) 0.1 MG/GM vaginal cream Estrogen Cream Instruction Discard applicator Apply pea sized amount to tip of finger to urethra before bed. Wash hands well after application. Use Monday, Wednesday and Friday   furosemide  (LASIX ) 20 MG tablet TAKE 1 TABLET BY MOUTH EVERY DAY AS NEEDED   Multiple Vitamin (MULTIVITAMIN) tablet Take 1 tablet by mouth daily.   mupirocin  ointment (BACTROBAN ) 2 % Apply 1 Application topically 2 (two) times daily.   omeprazole  (PRILOSEC) 20 MG capsule Take 1 capsule (20 mg total) by mouth 2 (two) times daily.   oxybutynin  (DITROPAN -XL) 10 MG 24 hr tablet Take 1 tablet (10 mg total) by mouth daily.   polyethylene glycol (MIRALAX / GLYCOLAX) 17 g packet Take 17 g by mouth daily.   triamcinolone  (NASACORT  ALLERGY 24HR) 55 MCG/ACT AERO nasal inhaler Place 1 spray into the nose daily.   Vibegron  (GEMTESA ) 75 MG TABS Take 1 tablet (75 mg total) by mouth daily.   [DISCONTINUED] benzonatate  (TESSALON ) 100 MG capsule TAKE 1 CAPSULE BY MOUTH THREE TIMES A DAY AS NEEDED FOR COUGH   No facility-administered encounter medications on file as of 01/13/2024.    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   Shingles outbreak 11/26/2011   Sleep apnea    Past Surgical History:  Procedure Laterality Date   APPENDECTOMY     BIOPSY  11/05/2022   Procedure: BIOPSY;  Surgeon: Maryruth Ole DASEN, MD;  Location: [facility] ENDOSCOPY;  Service: Endoscopy;;   BLADDER SURGERY  1993   BREAST BIOPSY N/A    capsule endoscopy     CHOLECYSTECTOMY  1995   COLONOSCOPY WITH PROPOFOL  N/A 06/19/2017   Procedure: COLONOSCOPY WITH PROPOFOL ;  Surgeon: Viktoria Lamar DASEN, MD;  Location: [facility] ENDOSCOPY;  Service: Endoscopy;  Laterality: N/A;   COLONOSCOPY WITH PROPOFOL  N/A 11/05/2022   Procedure: COLONOSCOPY WITH PROPOFOL ;  Surgeon: Maryruth Ole DASEN, MD;  Location: [facility] ENDOSCOPY;  Service: Endoscopy;  Laterality: N/A;   ESOPHAGOGASTRODUODENOSCOPY (EGD) WITH PROPOFOL  N/A 06/19/2017   Procedure: ESOPHAGOGASTRODUODENOSCOPY (EGD) WITH PROPOFOL ;  Surgeon: Viktoria Lamar DASEN, MD;  Location: [facility] ENDOSCOPY;  Service: Endoscopy;  Laterality: N/A;   ESOPHAGOGASTRODUODENOSCOPY (EGD) WITH PROPOFOL  N/A 11/05/2022   Procedure: ESOPHAGOGASTRODUODENOSCOPY (EGD) WITH PROPOFOL ;  Surgeon: Maryruth Ole DASEN, MD;  Location: [facility] ENDOSCOPY;  Service: Endoscopy;  Laterality: N/A;   EYE SURGERY     both cataract remove   FRACTURE SURGERY Left    ankle, plate with 8 screws   history of helicobacter pylori infection     JOINT REPLACEMENT Right    knee   LEFT OOPHORECTOMY     LEG SURGERY Left    ORIF ANKLE FRACTURE  2012    OVARIAN CYST REMOVAL     right   POLYPECTOMY  11/05/2022   Procedure: POLYPECTOMY;  Surgeon: Maryruth Ole DASEN, MD;  Location: [facility] ENDOSCOPY;  Service: Endoscopy;;   RECTOCELE REPAIR N/A    REPLACEMENT TOTAL KNEE Right 03/19/2016   TONSILECTOMY/ADENOIDECTOMY WITH MYRINGOTOMY  1970   TONSILLECTOMY N/A    TOTAL HIP ARTHROPLASTY Right 10/2023   TOTAL KNEE REVISION Right 11/07/2021   Procedure: TOTAL KNEE REVISION;  Surgeon: Edie Norleen PARAS, MD;  Location: [facility] ORS;  Service: Orthopedics;  Laterality: Right;   TUBAL LIGATION  1976   VAGINAL HYSTERECTOMY  1980's   secondary to bleeding   Family History  Problem Relation Age of Onset   Ulcers Mother    Ulcers Father    Breast cancer Other        great aunt and grandfather   Lung cancer Other        uncle   Hypertension Maternal Grandmother    Heart disease Maternal Grandmother        MI 47s   Diabetes Maternal Grandmother    Arthritis Maternal Grandmother    Diabetes Maternal Grandfather    Social History   Occupational History   Not on file  Tobacco Use   Smoking status: Never   Smokeless tobacco: Never  Vaping Use   Vaping status: Never Used  Substance and Sexual Activity   Alcohol use: No    Alcohol/week: 0.0 standard drinks of alcohol   Drug use: No   Sexual activity: Yes   Tobacco Counseling Counseling given: Not Answered  SDOH Screenings   Food Insecurity: No Food Insecurity (01/13/2024)  Housing: Low Risk  (01/13/2024)  Transportation Needs: No Transportation Needs (01/13/2024)  Utilities: Not At Risk (01/13/2024)  Alcohol Screen: Low Risk  (01/13/2024)  Depression (PHQ2-9): Low Risk  (01/13/2024)  Financial Resource Strain: Low Risk  (01/09/2024)  Physical Activity: Inactive (01/09/2024)  Social Connections: Socially Integrated (01/09/2024)  Stress: No Stress Concern Present (01/09/2024)  Tobacco Use: Low Risk  (01/13/2024)  Health Literacy: Adequate Health Literacy (01/13/2024)   See flowsheets for full  screening details  Depression Screen PHQ 2 & 9 Depression Scale- Over the past 2 weeks, how often have you been bothered by any of the following problems? Little interest or pleasure in doing things: 0 Feeling down, depressed, or hopeless (PHQ Adolescent also includes...irritable): 0 PHQ-2 Total Score: 0 Trouble falling or staying asleep, or sleeping too much: 0 Feeling tired or having little energy: 0 Poor appetite or overeating (PHQ Adolescent also includes...weight loss): 0 Feeling bad about yourself - or that you are a failure or have let yourself or your family down: 0 Trouble concentrating on things, such as reading the newspaper or watching television (PHQ Adolescent also includes...like school work): 0 Moving or speaking so slowly that other people could have noticed. Or the opposite - being so fidgety or restless that  you have been moving around a lot more than usual: 0 Thoughts that you would be better off dead, or of hurting yourself in some way: 0 PHQ-9 Total Score: 0 If you checked off any problems, how difficult have these problems made it for you to do your work, take care of things at home, or get along with other people?: Not difficult at all     Goals Addressed             This Visit's Progress    Patient Stated       Wants to walk without cane             Objective:    Today's Vitals   01/13/24 1134  Weight: 177 lb (80.3 kg)  Height: 5' 7 (1.702 m)   Body mass index is 27.72 kg/m.  Hearing/Vision screen Hearing Screening - Comments:: No issues Vision Screening - Comments:: Glasses, [place] [facility], up to date Immunizations and Health Maintenance Health Maintenance  Topic Date Due   COVID-19 Vaccine (3 - Moderna risk series) 05/18/2019   Influenza Vaccine  05/05/2024 (Originally 09/06/2023)   Medicare Annual Wellness (AWV)  01/12/2025   Colonoscopy  11/05/2027   DTaP/Tdap/Td (2 - Td or Tdap) 10/15/2028   Pneumococcal Vaccine: 50+ Years  Completed    Bone Density Scan  Completed   Hepatitis C Screening  Completed   Zoster Vaccines- Shingrix  Completed   Meningococcal B Vaccine  Aged Out   Mammogram  Discontinued        Assessment/Plan:  This is a routine wellness examination for [place].  Patient Care Team: Glendia Shad, MD as PCP - General (Internal Medicine) Gaston Glendia, MD as Consulting Physician (Urology) Taft Jayson BIRCH, MD as Referring Physician (Orthopedic Surgery)  I have personally reviewed and noted the following in the patient's chart:   Medical and social history Use of alcohol, tobacco or illicit drugs  Current medications and supplements including opioid prescriptions. Functional ability and status Nutritional status Physical activity Advanced directives List of other physicians Hospitalizations, surgeries, and ER visits in previous 12 months Vitals Screenings to include cognitive, depression, and falls Referrals and appointments  No orders of the defined types were placed in this encounter.  In addition, I have reviewed and discussed with patient certain preventive protocols, quality metrics, and best practice recommendations. A written personalized care plan for preventive services as well as general preventive health recommendations were provided to patient.   Angeline Fredericks, LPN   [id number]   Return in 1 year (on 01/12/2025).  After Visit Summary: (MyChart) Due to this being a telephonic visit, the after visit summary with patients personalized plan was offered to patient via MyChart   Nurse Notes: Discussed the need to update flu vaccine. Patient declines covid vaccine.

## 2024-01-16 DIAGNOSIS — R269 Unspecified abnormalities of gait and mobility: Secondary | ICD-10-CM | POA: Diagnosis not present

## 2024-01-16 DIAGNOSIS — M25551 Pain in right hip: Secondary | ICD-10-CM | POA: Diagnosis not present

## 2024-01-17 ENCOUNTER — Other Ambulatory Visit: Payer: Self-pay | Admitting: Internal Medicine

## 2024-01-21 ENCOUNTER — Ambulatory Visit: Admitting: Internal Medicine

## 2024-01-21 ENCOUNTER — Encounter: Payer: Self-pay | Admitting: Internal Medicine

## 2024-01-21 VITALS — BP 116/70 | HR 91 | Temp 98.6°F | Ht 67.0 in | Wt 179.0 lb

## 2024-01-21 DIAGNOSIS — K219 Gastro-esophageal reflux disease without esophagitis: Secondary | ICD-10-CM | POA: Diagnosis not present

## 2024-01-21 DIAGNOSIS — R269 Unspecified abnormalities of gait and mobility: Secondary | ICD-10-CM | POA: Diagnosis not present

## 2024-01-21 DIAGNOSIS — M19012 Primary osteoarthritis, left shoulder: Secondary | ICD-10-CM | POA: Diagnosis not present

## 2024-01-21 DIAGNOSIS — Z1211 Encounter for screening for malignant neoplasm of colon: Secondary | ICD-10-CM | POA: Diagnosis not present

## 2024-01-21 DIAGNOSIS — M19011 Primary osteoarthritis, right shoulder: Secondary | ICD-10-CM

## 2024-01-21 DIAGNOSIS — E559 Vitamin D deficiency, unspecified: Secondary | ICD-10-CM | POA: Diagnosis not present

## 2024-01-21 DIAGNOSIS — Z23 Encounter for immunization: Secondary | ICD-10-CM

## 2024-01-21 DIAGNOSIS — R7989 Other specified abnormal findings of blood chemistry: Secondary | ICD-10-CM

## 2024-01-21 DIAGNOSIS — R55 Syncope and collapse: Secondary | ICD-10-CM

## 2024-01-21 DIAGNOSIS — Z96651 Presence of right artificial knee joint: Secondary | ICD-10-CM

## 2024-01-21 DIAGNOSIS — D649 Anemia, unspecified: Secondary | ICD-10-CM | POA: Diagnosis not present

## 2024-01-21 DIAGNOSIS — C8590 Non-Hodgkin lymphoma, unspecified, unspecified site: Secondary | ICD-10-CM

## 2024-01-21 DIAGNOSIS — E7800 Pure hypercholesterolemia, unspecified: Secondary | ICD-10-CM

## 2024-01-21 DIAGNOSIS — M25551 Pain in right hip: Secondary | ICD-10-CM | POA: Diagnosis not present

## 2024-01-21 DIAGNOSIS — R739 Hyperglycemia, unspecified: Secondary | ICD-10-CM

## 2024-01-21 LAB — TSH: TSH: 0.96 u[IU]/mL (ref 0.35–5.50)

## 2024-01-21 LAB — LIPID PANEL
Cholesterol: 181 mg/dL (ref 28–200)
HDL: 55.9 mg/dL (ref >39.00)
LDL Cholesterol: 97 mg/dL (ref 0–99)
NonHDL: 125.17
Total CHOL/HDL Ratio: 3
Triglycerides: 143 mg/dL (ref 0.0–149.0)
VLDL: 28.6 mg/dL (ref 0.0–40.0)

## 2024-01-21 LAB — BASIC METABOLIC PANEL WITH GFR
BUN: 15 mg/dL (ref 6–23)
CO2: 31 meq/L (ref 19–32)
Calcium: 10.2 mg/dL (ref 8.4–10.5)
Chloride: 98 meq/L (ref 96–112)
Creatinine, Ser: 0.84 mg/dL (ref 0.40–1.20)
GFR: 68.11 mL/min (ref >60.00)
Glucose, Bld: 106 mg/dL — ABNORMAL HIGH (ref 70–99)
Potassium: 4.4 meq/L (ref 3.5–5.1)
Sodium: 140 meq/L (ref 135–145)

## 2024-01-21 LAB — HEPATIC FUNCTION PANEL
ALT: 17 U/L (ref 0–35)
AST: 19 U/L (ref 5–37)
Albumin: 5 g/dL (ref 3.5–5.2)
Alkaline Phosphatase: 85 U/L (ref 39–117)
Bilirubin, Direct: 0.1 mg/dL (ref 0.0–0.3)
Total Bilirubin: 0.7 mg/dL (ref 0.2–1.2)
Total Protein: 7.4 g/dL (ref 6.0–8.3)

## 2024-01-21 LAB — HEMOGLOBIN A1C: Hgb A1c MFr Bld: 5.5 % (ref 4.6–6.5)

## 2024-01-21 LAB — VITAMIN D 25 HYDROXY (VIT D DEFICIENCY, FRACTURES): VITD: 27.25 ng/mL — ABNORMAL LOW (ref 30.00–100.00)

## 2024-01-21 NOTE — Progress Notes (Signed)
 "  Subjective:    Patient ID: Maria Keller, female    DOB: 15-Feb-1950, 73 y.o.   MRN: [id number]  Patient here for  Chief Complaint  Patient presents with   Medical Management of Chronic Issues    HPI Here for a scheduled follow up. Recently admitted 10/27/23 - 10/31/23 - s/p fall with resulting closed fracture of right hip and syncope. S/p R THA on 10/28/23. Recommended LMWH x 28 days total (to end 11/26/23). Also recommended 7 day holter monitor at discharge. Regarding syncope, TTE 9/21 - no structural abnormalities, EF 55%. Symptomatic orthostasis noted. 5 beat run of SVT on telementry 10/31/23, otherwise no arrhythmias captured. CT - negative for PE. Recommended adequate hydration and compression stockings. Noted hypoxia during hospitalization. CT PE negative on 9/23. She had brief episode of hypoxia again on 9/24 with T Max 38.7 - CXR repeated with atelectasis without infiltrates. Procalcitonin negative. UA negative. WBC downtrending to 10.6 on 9/25 from 10.9 on 9/24. She does have reported h/o sleep apnea that was brought on by use of chronic oxycodone  + fentanyl  patch 10-15 years ago for chronic back pain; however has been off narcotics for years, but now restarted with hip fracture. She reports repeat sleep study was negative. Since discharge, she is working with PT.  Had holter monitor 11/15/23 - The predominant rhythm was Sinus. *The Maximum Heart Rate recorded was 191 bpm, 09/26 07:09:53, the Minimum Heart Rate recorded was 64 bpm, 10/02 01:52:38, and the Average Heart Rate was 82 bpm. *There were 1,105 VE beats with a burden of <1 %. There  was 1 occurrence of Ventricular Tachycardia with the Fastest episode --, and the Longest episode 5 beats. *There were 709 SVE beats with a burden of <1 %. There were 27 occurrences of Supraventricular Tachycardia with the Fastest episode 191 bpm, 09/26  07:09:52, and the Longest episode 10.8s, 09/26 23:09:38. *There were 0 Patient Triggers. No sustained  arrhythmias detected. Had recommended further cardiac w/up. Agreeable to further evaluation - locally. No further episodes. Breathing stable. No increased cough. Discussed prilosec.    Past Medical History:  Diagnosis Date   Allergy    Anemia    iron deficiency and B12 deficiency   Arthritis    Blood transfusion without reported diagnosis    Bronchitis 08/2015   Chronic back pain    followed by Dr Dannial   COPD (chronic obstructive pulmonary disease) (HCC)    Depression    Diverticulosis    Esophageal stricture    requiring dilatation x 2   Fibromyalgia    Fibromyalgia    Fibromyalgia    Follicular lymphoma (HCC)    Followed by Dr DeCastro, s/p chemo and XRT   Follicular lymphoma (HCC)    GERD (gastroesophageal reflux disease)    GERD (gastroesophageal reflux disease)    Hemorrhoid    Hiatal hernia    History of kidney stones    Hypercholesterolemia    IBS (irritable bowel syndrome)    IBS (irritable bowel syndrome)    Nephrolithiasis    followed by Dr Twylla, s/p stents   PONV (postoperative nausea and vomiting)    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   Shingles outbreak 11/26/2011   Sleep apnea    Past Surgical History:  Procedure Laterality Date   APPENDECTOMY     BIOPSY  11/05/2022   Procedure: BIOPSY;  Surgeon: Maryruth Ole DASEN, MD;  Location: [facility] ENDOSCOPY;  Service: Endoscopy;;  BLADDER SURGERY  1993   BREAST BIOPSY N/A    capsule endoscopy     CHOLECYSTECTOMY  1995   COLONOSCOPY WITH PROPOFOL  N/A 06/19/2017   Procedure: COLONOSCOPY WITH PROPOFOL ;  Surgeon: Viktoria Lamar DASEN, MD;  Location: [facility] ENDOSCOPY;  Service: Endoscopy;  Laterality: N/A;   COLONOSCOPY WITH PROPOFOL  N/A 11/05/2022   Procedure: COLONOSCOPY WITH PROPOFOL ;  Surgeon: Maryruth Ole DASEN, MD;  Location: [facility] ENDOSCOPY;  Service: Endoscopy;  Laterality: N/A;   ESOPHAGOGASTRODUODENOSCOPY (EGD) WITH PROPOFOL  N/A 06/19/2017   Procedure:  ESOPHAGOGASTRODUODENOSCOPY (EGD) WITH PROPOFOL ;  Surgeon: Viktoria Lamar DASEN, MD;  Location: [facility] ENDOSCOPY;  Service: Endoscopy;  Laterality: N/A;   ESOPHAGOGASTRODUODENOSCOPY (EGD) WITH PROPOFOL  N/A 11/05/2022   Procedure: ESOPHAGOGASTRODUODENOSCOPY (EGD) WITH PROPOFOL ;  Surgeon: Maryruth Ole DASEN, MD;  Location: [facility] ENDOSCOPY;  Service: Endoscopy;  Laterality: N/A;   EYE SURGERY     both cataract remove   FRACTURE SURGERY Left    ankle, plate with 8 screws   history of helicobacter pylori infection     JOINT REPLACEMENT Right    knee   LEFT OOPHORECTOMY     LEG SURGERY Left    ORIF ANKLE FRACTURE  2012   OVARIAN CYST REMOVAL     right   POLYPECTOMY  11/05/2022   Procedure: POLYPECTOMY;  Surgeon: Maryruth Ole DASEN, MD;  Location: [facility] ENDOSCOPY;  Service: Endoscopy;;   RECTOCELE REPAIR N/A    REPLACEMENT TOTAL KNEE Right 03/19/2016   TONSILECTOMY/ADENOIDECTOMY WITH MYRINGOTOMY  1970   TONSILLECTOMY N/A    TOTAL HIP ARTHROPLASTY Right 10/2023   TOTAL KNEE REVISION Right 11/07/2021   Procedure: TOTAL KNEE REVISION;  Surgeon: Edie Norleen PARAS, MD;  Location: [facility] ORS;  Service: Orthopedics;  Laterality: Right;   TUBAL LIGATION  1976   VAGINAL HYSTERECTOMY  1980's   secondary to bleeding   Family History  Problem Relation Age of Onset   Ulcers Mother    Ulcers Father    Breast cancer Other        great aunt and grandfather   Lung cancer Other        uncle   Hypertension Maternal Grandmother    Heart disease Maternal Grandmother        MI 31s   Diabetes Maternal Grandmother    Arthritis Maternal Grandmother    Diabetes Maternal Grandfather    Cancer Brother    Cancer Maternal Aunt    Miscarriages / Stillbirths Sister    COPD Brother    ADD / ADHD Brother    Social History   Socioeconomic History   Marital status: Married    Spouse name: [name]   Number of children: Not on file   Years of education: Not on file   Highest education level: Associate degree: academic  program  Occupational History   Not on file  Tobacco Use   Smoking status: Never   Smokeless tobacco: Never  Vaping Use   Vaping status: Never Used  Substance and Sexual Activity   Alcohol use: No    Alcohol/week: 0.0 standard drinks of alcohol   Drug use: No   Sexual activity: Yes  Other Topics Concern   Not on file  Social History Narrative   Not on file   Social Drivers of Health   Tobacco Use: Low Risk (01/31/2024)   Patient History    Smoking Tobacco Use: Never    Smokeless Tobacco Use: Never    Passive Exposure: Not on file  Financial Resource Strain: Low Risk (01/09/2024)  Overall Financial Resource Strain (CARDIA)    Difficulty of Paying Living Expenses: Not hard at all  Food Insecurity: No Food Insecurity (01/13/2024)   Epic    Worried About Programme Researcher, Broadcasting/film/video in the Last Year: Never true    Ran Out of Food in the Last Year: Never true  Transportation Needs: No Transportation Needs (01/13/2024)   Epic    Lack of Transportation (Medical): No    Lack of Transportation (Non-Medical): No  Physical Activity: Inactive (01/09/2024)   Exercise Vital Sign    Days of Exercise per Week: 0 days    Minutes of Exercise per Session: 0 min  Stress: No Stress Concern Present (01/09/2024)   [employer] of Occupational Health - Occupational Stress Questionnaire    Feeling of Stress: Not at all  Social Connections: Socially Integrated (01/09/2024)   Social Connection and Isolation Panel    Frequency of Communication with Friends and Family: More than three times a week    Frequency of Social Gatherings with Friends and Family: More than three times a week    Attends Religious Services: More than 4 times per year    Active Member of Clubs or Organizations: Yes    Attends Banker Meetings: More than 4 times per year    Marital Status: Married  Depression (PHQ2-9): Low Risk (01/13/2024)   Depression (PHQ2-9)    PHQ-2 Score: 0  Alcohol Screen: Low Risk (01/13/2024)    Alcohol Screen    Last Alcohol Screening Score (AUDIT): 0  Housing: Low Risk (01/13/2024)   Epic    Unable to Pay for Housing in the Last Year: No    Number of Times Moved in the Last Year: 0    Homeless in the Last Year: No  Utilities: Not At Risk (01/13/2024)   Epic    Threatened with loss of utilities: No  Health Literacy: Adequate Health Literacy (01/13/2024)   B1300 Health Literacy    Frequency of need for help with medical instructions: Never     Review of Systems  Constitutional:  Negative for appetite change and unexpected weight change.  HENT:  Negative for congestion and sinus pressure.   Respiratory:  Negative for cough, chest tightness and shortness of breath.   Cardiovascular:  Negative for chest pain, palpitations and leg swelling.  Gastrointestinal:  Negative for abdominal pain, diarrhea, nausea and vomiting.  Genitourinary:  Negative for difficulty urinating and dysuria.  Musculoskeletal:  Negative for joint swelling and myalgias.  Skin:  Negative for color change and rash.  Neurological:  Negative for dizziness and headaches.  Psychiatric/Behavioral:  Negative for agitation and dysphoric mood.        Objective:     BP 116/70   Pulse 91   Temp 98.6 F (37 C) (Oral)   Ht 5' 7 (1.702 m)   Wt 179 lb (81.2 kg)   SpO2 95%   BMI 28.04 kg/m  Wt Readings from Last 3 Encounters:  01/21/24 179 lb (81.2 kg)  01/13/24 177 lb (80.3 kg)  12/16/23 177 lb (80.3 kg)    Physical Exam Vitals reviewed.  Constitutional:      General: She is not in acute distress.    Appearance: Normal appearance.  HENT:     Head: Normocephalic and atraumatic.     Right Ear: External ear normal.     Left Ear: External ear normal.     Mouth/Throat:     Pharynx: No oropharyngeal exudate or posterior oropharyngeal erythema.  Eyes:     General: No scleral icterus.       Right eye: No discharge.        Left eye: No discharge.     Conjunctiva/sclera: Conjunctivae normal.  Neck:      Thyroid : No thyromegaly.  Cardiovascular:     Rate and Rhythm: Normal rate and regular rhythm.  Pulmonary:     Effort: No respiratory distress.     Breath sounds: Normal breath sounds. No wheezing.  Abdominal:     General: Bowel sounds are normal.     Palpations: Abdomen is soft.     Tenderness: There is no abdominal tenderness.  Musculoskeletal:        General: No swelling or tenderness.     Cervical back: Neck supple. No tenderness.  Lymphadenopathy:     Cervical: No cervical adenopathy.  Skin:    Findings: No erythema or rash.  Neurological:     Mental Status: She is alert.  Psychiatric:        Mood and Affect: Mood normal.        Behavior: Behavior normal.         Outpatient Encounter Medications as of 01/21/2024  Medication Sig   acetaminophen  (TYLENOL ) 650 MG CR tablet Take 650 mg by mouth every 8 (eight) hours as needed for pain.   aspirin EC 81 MG tablet Take 81 mg by mouth daily.   atorvastatin  (LIPITOR) 10 MG tablet Take 1 tablet (10 mg total) by mouth daily.   cetirizine (ZYRTEC) 10 MG tablet Take by mouth.   estradiol  (ESTRACE ) 0.1 MG/GM vaginal cream Estrogen Cream Instruction Discard applicator Apply pea sized amount to tip of finger to urethra before bed. Wash hands well after application. Use Monday, Wednesday and Friday   Multiple Vitamin (MULTIVITAMIN) tablet Take 1 tablet by mouth daily.   mupirocin  ointment (BACTROBAN ) 2 % Apply 1 Application topically 2 (two) times daily.   oxybutynin  (DITROPAN -XL) 10 MG 24 hr tablet Take 1 tablet (10 mg total) by mouth daily.   polyethylene glycol (MIRALAX / GLYCOLAX) 17 g packet Take 17 g by mouth daily.   triamcinolone  (NASACORT  ALLERGY 24HR) 55 MCG/ACT AERO nasal inhaler Place 1 spray into the nose daily.   furosemide  (LASIX ) 20 MG tablet Take 1 tablet (20 mg total) by mouth daily as needed.   omeprazole  (PRILOSEC) 20 MG capsule Take 1 capsule (20 mg total) by mouth 2 (two) times daily.   [DISCONTINUED] furosemide   (LASIX ) 20 MG tablet TAKE 1 TABLET BY MOUTH EVERY DAY AS NEEDED   [DISCONTINUED] omeprazole  (PRILOSEC) 20 MG capsule TAKE 1 CAPSULE BY MOUTH TWICE A DAY   [DISCONTINUED] Vibegron  (GEMTESA ) 75 MG TABS Take 1 tablet (75 mg total) by mouth daily.   No facility-administered encounter medications on file as of 01/21/2024.     Lab Results  Component Value Date   WBC 7.2 12/10/2023   HGB 11.8 (L) 12/10/2023   HCT 35.9 (L) 12/10/2023   PLT 264.0 12/10/2023   GLUCOSE 106 (H) 01/21/2024   CHOL 181 01/21/2024   TRIG 143.0 01/21/2024   HDL 55.90 01/21/2024   LDLDIRECT 164.2 02/11/2013   LDLCALC 97 01/21/2024   ALT 17 01/21/2024   AST 19 01/21/2024   NA 140 01/21/2024   K 4.4 01/21/2024   CL 98 01/21/2024   CREATININE 0.84 01/21/2024   BUN 15 01/21/2024   CO2 31 01/21/2024   TSH 0.96 01/21/2024   HGBA1C 5.5 01/21/2024    No results found.  Assessment & Plan:  Abnormal CBC -     CBC with Differential/Platelet; Future -     VITAMIN D  25 Hydroxy (Vit-D Deficiency, Fractures)  Anemia, unspecified type Assessment & Plan: Hgb decreased during hospitalization. Follow cbc.   Orders: -     IBC + Ferritin; Future -     VITAMIN D  25 Hydroxy (Vit-D Deficiency, Fractures) -     Basic metabolic panel with GFR  Hypercholesterolemia Assessment & Plan: Taking lipitor. Continue low cholesterol diet and exercise. Follow lipid panel. Discussed recent labs.   Lab Results  Component Value Date   CHOL 181 01/21/2024   HDL 55.90 01/21/2024   LDLCALC 97 01/21/2024   LDLDIRECT 164.2 02/11/2013   TRIG 143.0 01/21/2024   CHOLHDL 3 01/21/2024     Orders: -     Hepatic function panel; Future -     Basic metabolic panel with GFR; Future -     TSH -     Lipid panel -     Hepatic function panel  Hyperglycemia Assessment & Plan: Follow met b and A1c. Monitor carb intake.   Orders: -     Hemoglobin A1c  Need for influenza vaccination -     Flu vaccine HIGH DOSE PF(Fluzone  Trivalent)  Vitamin D  deficiency, unspecified -     VITAMIN D  25 Hydroxy (Vit-D Deficiency, Fractures)  Syncope, unspecified syncope type Assessment & Plan:  Admitted 10/27/23 - 10/31/23 - s/p fall with resulting closed fracture of right hip and syncope. Husband reports prior to episode, she had not eaten much. Feltt sweaty. S/p R THA on 10/28/23. Recommended LMWH x 28 days total (to end 11/26/23). Also recommended 7 day holter monitor at discharge. Regarding syncope, TTE 9/21 - no structural abnormalities, EF 55%. Symptomatic orthostasis noted. 5 beat run of SVT on telementry 10/31/23, otherwise no arrhythmias captured. CT - negative for PE. Recommended adequate hydration and compression stockings. Noted hypoxia during hospitalization. CT PE negative on 9/23. She had brief episode of hypoxia again on 9/24 with T Max 38.7 - CXR repeated with atelectasis without infiltrates. Procalcitonin negative. UA negative. WBC downtrending to 10.6 on 9/25 from 10.9 on 9/24. She does have reported h/o sleep apnea that was brought on by use of chronic oxycodone  + fentanyl  patch 10-15 years ago for chronic back pain; however has been off narcotics for years, but now restarted with hip fracture. She reports repeat sleep study was negative. Did wear monitor. Results pending. Discussed need for f/u with cardiology and possibly neuro- pending cardiology w/up. Agreeable to cardiology referral.   Orders: -     Ambulatory referral to Cardiology  Non-Hodgkin's lymphoma, unspecified body region, unspecified non-Hodgkin lymphoma type [facility]) Assessment & Plan: Has previously been followed by Dr DeCastro.  Stable.  Has been following up yearly. Per note, has been released. Follow cbc.    History of total knee replacement (Right) Assessment & Plan: S/p R THA on 10/28/23. Continue f/u with ortho.    Gastro-esophageal reflux disease without esophagitis Assessment & Plan: Prilosec 20mg  bid. Follow.    Colon cancer  screening Assessment & Plan: Colonoscopy 10/2022 - pathology - tubular adenoma, hyperplastic polyp.    Other orders -     Furosemide ; Take 1 tablet (20 mg total) by mouth daily as needed.  Dispense: 90 tablet; Refill: 0 -     Omeprazole ; Take 1 capsule (20 mg total) by mouth 2 (two) times daily.  Dispense: 180 capsule; Refill: 1     Allena Hamilton,  MD "

## 2024-01-22 ENCOUNTER — Ambulatory Visit: Payer: Self-pay | Admitting: Internal Medicine

## 2024-01-22 ENCOUNTER — Other Ambulatory Visit: Payer: Self-pay | Admitting: Internal Medicine

## 2024-01-22 DIAGNOSIS — D649 Anemia, unspecified: Secondary | ICD-10-CM

## 2024-01-22 DIAGNOSIS — R944 Abnormal results of kidney function studies: Secondary | ICD-10-CM

## 2024-01-22 NOTE — Progress Notes (Signed)
 Order placed for future labs

## 2024-01-24 NOTE — Telephone Encounter (Unsigned)
 Copied from CRM #[id number]. Topic: Appointments - Scheduling Inquiry for Clinic >> Jan 24, 2024  8:59 AM Mercedes MATSU wrote: Reason for CRM: Patient called in requesting to speak with Deja. She said it was in regards to her upcoming lab appointment. She was confused as to when she was supposed to return for labs, she said Deja said she would call her back with the information and did not. There are not notes on chart to relay. Patient is requesting a call back and can be reached at [phone].

## 2024-01-24 NOTE — Progress Notes (Signed)
 Patient appointment for 12/23 has been cancelled and patient is now scheduled for 3 weeks non-fasting labs per Dr Glendia on Thursday January 8th at 9:30 am.

## 2024-01-27 ENCOUNTER — Encounter: Payer: Self-pay | Admitting: Internal Medicine

## 2024-01-27 NOTE — Assessment & Plan Note (Signed)
 Prilosec 20mg  bid. Follow.

## 2024-01-27 NOTE — Assessment & Plan Note (Signed)
 Follow met b and A1c. Monitor carb intake.

## 2024-01-27 NOTE — Assessment & Plan Note (Addendum)
 Has previously been followed by Dr DeCastro.  Stable.  Has been following up yearly. Per note, has been released. Follow cbc.

## 2024-01-27 NOTE — Assessment & Plan Note (Signed)
 Hgb decreased during hospitalization. Follow cbc.

## 2024-01-27 NOTE — Assessment & Plan Note (Signed)
 S/p R THA on 10/28/23. Continue f/u with ortho.

## 2024-01-27 NOTE — Assessment & Plan Note (Signed)
"   Admitted 10/27/23 - 10/31/23 - s/p fall with resulting closed fracture of right hip and syncope. Husband reports prior to episode, she had not eaten much. Feltt sweaty. S/p R THA on 10/28/23. Recommended LMWH x 28 days total (to end 11/26/23). Also recommended 7 day holter monitor at discharge. Regarding syncope, TTE 9/21 - no structural abnormalities, EF 55%. Symptomatic orthostasis noted. 5 beat run of SVT on telementry 10/31/23, otherwise no arrhythmias captured. CT - negative for PE. Recommended adequate hydration and compression stockings. Noted hypoxia during hospitalization. CT PE negative on 9/23. She had brief episode of hypoxia again on 9/24 with T Max 38.7 - CXR repeated with atelectasis without infiltrates. Procalcitonin negative. UA negative. WBC downtrending to 10.6 on 9/25 from 10.9 on 9/24. She does have reported h/o sleep apnea that was brought on by use of chronic oxycodone  + fentanyl  patch 10-15 years ago for chronic back pain; however has been off narcotics for years, but now restarted with hip fracture. She reports repeat sleep study was negative. Did wear monitor. Results pending. Discussed need for f/u with cardiology and possibly neuro- pending cardiology w/up. Agreeable to cardiology referral.  "

## 2024-01-27 NOTE — Assessment & Plan Note (Signed)
 Taking lipitor. Continue low cholesterol diet and exercise. Follow lipid panel. Discussed recent labs.   Lab Results  Component Value Date   CHOL 181 01/21/2024   HDL 55.90 01/21/2024   LDLCALC 97 01/21/2024   LDLDIRECT 164.2 02/11/2013   TRIG 143.0 01/21/2024   CHOLHDL 3 01/21/2024

## 2024-01-28 ENCOUNTER — Other Ambulatory Visit

## 2024-01-31 ENCOUNTER — Encounter: Payer: Self-pay | Admitting: Internal Medicine

## 2024-01-31 MED ORDER — FUROSEMIDE 20 MG PO TABS: 20.0000 mg | ORAL_TABLET | Freq: Every day | ORAL | 0 refills | Status: AC | PRN

## 2024-01-31 MED ORDER — OMEPRAZOLE 20 MG PO CPDR: 20.0000 mg | DELAYED_RELEASE_CAPSULE | Freq: Two times a day (BID) | ORAL | 1 refills | Status: AC

## 2024-01-31 NOTE — Assessment & Plan Note (Signed)
 Colonoscopy 10/2022 - pathology - tubular adenoma, hyperplastic polyp.

## 2024-01-31 NOTE — Assessment & Plan Note (Deleted)
 Evaluated by Dr DeCastro 10/2022. Released. Follow cbc.

## 2024-02-04 ENCOUNTER — Other Ambulatory Visit: Payer: Self-pay | Admitting: Internal Medicine

## 2024-02-04 ENCOUNTER — Other Ambulatory Visit: Payer: Self-pay | Admitting: Nurse Practitioner

## 2024-02-04 NOTE — Telephone Encounter (Signed)
 Noted

## 2024-02-06 NOTE — Progress Notes (Unsigned)
 Cardiology Office Note  Date:  02/07/2024   ID:  Celester, Morgan September 07, 1950, MRN [id number]  PCP:  Glendia Shad, MD   Chief Complaint  Patient presents with   New Patient (Initial Visit)    Syncope no complaints today. Meds reviewed verbally with pt.    HPI:  Maria Keller is a 74 y.o. female with past medical history of: Past Medical History:  Diagnosis Date   Allergy    Anemia    iron deficiency and B12 deficiency   Arthritis    Blood transfusion without reported diagnosis    Bronchitis 08/2015   Chronic back pain    followed by Dr Dannial   COPD (chronic obstructive pulmonary disease) (HCC)    Depression    Diverticulosis    Esophageal stricture    requiring dilatation x 2   Fibromyalgia    Fibromyalgia    Fibromyalgia    Follicular lymphoma (HCC)    Followed by Dr DeCastro, s/p chemo and XRT   Follicular lymphoma (HCC)    GERD (gastroesophageal reflux disease)    GERD (gastroesophageal reflux disease)    Hemorrhoid    Hiatal hernia    History of kidney stones    Hypercholesterolemia    IBS (irritable bowel syndrome)    IBS (irritable bowel syndrome)    Nephrolithiasis    followed by Dr Twylla, s/p stents   PONV (postoperative nausea and vomiting)    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   Shingles outbreak 11/26/2011   Sleep apnea   Who presents by referral from Dr. Shad Glendia for syncope  In the hospital September 2025 with syncope On that day she was busy, ate breakfast, had crackers for lunch Was laying down to go to sleep but got up to greet family. Notes indicating she felt nauseated, developing a HA, blurry vision, seeing floaters and then suddenly passing out. Fall was witnessed by her family but she is unsure how long she was passed out.  Suffered fracture hip  son told her she helped them unload the car and even spoke with them for about 15-20 min before the event occurred. Says she was told that  when she woke up she appeared pale and clammy.  Underwent surgery at [facility] Syncope workup including 1 week Zio monitor, echocardiogram No recurrent event since that time  Echo October 27, 2023 Essentially normal study, normal ejection fraction  CT brain 10/27/23, no acute findings  Postoperatively with anemia HGB 8.4 down from 12  on initial admission to the hospital normal CMP Did not appear dehydrated  Currently doing therapy  Zio monitor reviewed *The Maximum Heart Rate recorded was 191 bpm, 09/26 07:09:53, the Minimum Heart Rate recorded was 64 bpm, 10/02 01:52:38, and the Average Heart Rate was 82 bpm. *There were 1,105 VE beats with a burden of <1 %. There  was 1 occurrence of Ventricular Tachycardia with the Fastest episode --, and the Longest episode 5 beats. *There were 709 SVE beats with a burden of <1 %. There were 27 occurrences of Supraventricular Tachycardia with the Fastest episode 191 bpm, 09/26  07:09:52, and the Longest episode 10.8s, 09/26 23:09:38. *There were 0 Patient Triggers.   01/29/24 appreciated paroxysmal tachycardia while laying in bed, lasted a minute and resolved without intervention  EKG personally reviewed by myself on todays visit EKG Interpretation Date/Time:  Friday February 07 2024 15:39:07 EST Ventricular Rate:  69 PR Interval:  144  QRS Duration:  98 QT Interval:  426 QTC Calculation: 456 R Axis:   5  Text Interpretation: Normal sinus rhythm When compared with ECG of 26-Oct-2021 11:35, No significant change was found Confirmed by Perla Lye [phone]) on 02/07/2024 4:03:27 PM    PMH:   has a past medical history of Allergy, Anemia, Arthritis, Blood transfusion without reported diagnosis, Bronchitis (08/2015), Chronic back pain, COPD (chronic obstructive pulmonary disease) (HCC), Depression, Diverticulosis, Esophageal stricture, Fibromyalgia, Fibromyalgia, Fibromyalgia, Follicular lymphoma (HCC), Follicular lymphoma (HCC), GERD  (gastroesophageal reflux disease), GERD (gastroesophageal reflux disease), Hemorrhoid, Hiatal hernia, History of kidney stones, Hypercholesterolemia, IBS (irritable bowel syndrome), IBS (irritable bowel syndrome), Nephrolithiasis, PONV (postoperative nausea and vomiting), Shingles outbreak (11/26/2011), and Sleep apnea.   PSH:    Past Surgical History:  Procedure Laterality Date   APPENDECTOMY     BIOPSY  11/05/2022   Procedure: BIOPSY;  Surgeon: Maryruth Ole DASEN, MD;  Location: [facility] ENDOSCOPY;  Service: Endoscopy;;   BLADDER SURGERY  1993   BREAST BIOPSY N/A    capsule endoscopy     CHOLECYSTECTOMY  1995   COLONOSCOPY WITH PROPOFOL  N/A 06/19/2017   Procedure: COLONOSCOPY WITH PROPOFOL ;  Surgeon: Viktoria Lamar DASEN, MD;  Location: [facility] ENDOSCOPY;  Service: Endoscopy;  Laterality: N/A;   COLONOSCOPY WITH PROPOFOL  N/A 11/05/2022   Procedure: COLONOSCOPY WITH PROPOFOL ;  Surgeon: Maryruth Ole DASEN, MD;  Location: [facility] ENDOSCOPY;  Service: Endoscopy;  Laterality: N/A;   ESOPHAGOGASTRODUODENOSCOPY (EGD) WITH PROPOFOL  N/A 06/19/2017   Procedure: ESOPHAGOGASTRODUODENOSCOPY (EGD) WITH PROPOFOL ;  Surgeon: Viktoria Lamar DASEN, MD;  Location: [facility] ENDOSCOPY;  Service: Endoscopy;  Laterality: N/A;   ESOPHAGOGASTRODUODENOSCOPY (EGD) WITH PROPOFOL  N/A 11/05/2022   Procedure: ESOPHAGOGASTRODUODENOSCOPY (EGD) WITH PROPOFOL ;  Surgeon: Maryruth Ole DASEN, MD;  Location: [facility] ENDOSCOPY;  Service: Endoscopy;  Laterality: N/A;   EYE SURGERY     both cataract remove   FRACTURE SURGERY Left    ankle, plate with 8 screws   history of helicobacter pylori infection     JOINT REPLACEMENT Right    knee   LEFT OOPHORECTOMY     LEG SURGERY Left    ORIF ANKLE FRACTURE  2012   OVARIAN CYST REMOVAL     right   POLYPECTOMY  11/05/2022   Procedure: POLYPECTOMY;  Surgeon: Maryruth Ole DASEN, MD;  Location: [facility] ENDOSCOPY;  Service: Endoscopy;;   RECTOCELE REPAIR N/A    REPLACEMENT TOTAL KNEE Right 03/19/2016    TONSILECTOMY/ADENOIDECTOMY WITH MYRINGOTOMY  1970   TONSILLECTOMY N/A    TOTAL HIP ARTHROPLASTY Right 10/2023   TOTAL KNEE REVISION Right 11/07/2021   Procedure: TOTAL KNEE REVISION;  Surgeon: Edie Norleen PARAS, MD;  Location: [facility] ORS;  Service: Orthopedics;  Laterality: Right;   TUBAL LIGATION  1976   VAGINAL HYSTERECTOMY  1980's   secondary to bleeding    Current Outpatient Medications  Medication Sig Dispense Refill   acetaminophen  (TYLENOL ) 650 MG CR tablet Take 650 mg by mouth every 8 (eight) hours as needed for pain.     aspirin EC 81 MG tablet Take 81 mg by mouth daily.     atorvastatin  (LIPITOR) 10 MG tablet Take 1 tablet (10 mg total) by mouth daily. 90 tablet 3   Calcium  Carb-Cholecalciferol  (CALCIUM -VITAMIN D  PO) Take by mouth daily.     cetirizine (ZYRTEC) 10 MG tablet Take by mouth.     estradiol  (ESTRACE ) 0.1 MG/GM vaginal cream Estrogen Cream Instruction Discard applicator Apply pea sized amount to tip of finger to urethra before bed. Wash hands well  after application. Use Monday, Wednesday and Friday 42.5 g 12   furosemide  (LASIX ) 20 MG tablet Take 1 tablet (20 mg total) by mouth daily as needed. 90 tablet 0   Multiple Vitamin (MULTIVITAMIN) tablet Take 1 tablet by mouth daily.     mupirocin  ointment (BACTROBAN ) 2 % Apply 1 Application topically 2 (two) times daily. 22 g 0   omeprazole  (PRILOSEC) 20 MG capsule Take 1 capsule (20 mg total) by mouth 2 (two) times daily. 180 capsule 1   oxybutynin  (DITROPAN -XL) 10 MG 24 hr tablet Take 1 tablet (10 mg total) by mouth daily. 30 tablet 11   polyethylene glycol (MIRALAX / GLYCOLAX) 17 g packet Take 17 g by mouth daily.     triamcinolone  (NASACORT  ALLERGY 24HR) 55 MCG/ACT AERO nasal inhaler Place 1 spray into the nose daily.     No current facility-administered medications for this visit.   Allergies:   Oxycontin  [oxycodone  hcl], Penicillins, Chloral hydrate, Ciprofloxacin, Colace [docusate calcium ], Docusate sodium , Methotrexate and  trimetrexate, Parafon forte dsc [chlorzoxazone], Penicillin g, Tape, Bextra [valdecoxib], Oxycodone , and Rofecoxib   Social History:  The patient  reports that she has never smoked. She has never used smokeless tobacco. She reports that she does not drink alcohol and does not use drugs.   Family History:   family history includes ADD / ADHD in her brother; Arthritis in her maternal grandmother; Breast cancer in an other family member; COPD in her brother; Cancer in her brother and maternal aunt; Diabetes in her maternal grandfather and maternal grandmother; Heart disease in her maternal grandmother; Hypertension in her maternal grandmother; Lung cancer in an other family member; Miscarriages / Stillbirths in her sister; Ulcers in her father and mother.    Review of Systems: Review of Systems  Constitutional: Negative.   HENT: Negative.    Respiratory: Negative.    Cardiovascular: Negative.   Gastrointestinal: Negative.   Musculoskeletal: Negative.   Neurological: Negative.   Psychiatric/Behavioral: Negative.    All other systems reviewed and are negative.   PHYSICAL EXAM: VS:  BP 122/77 (BP Location: Right Arm, Patient Position: Sitting, Cuff Size: Normal)   Pulse 69   Ht 5' 7 (1.702 m)   Wt 180 lb 4 oz (81.8 kg)   SpO2 96%   BMI 28.23 kg/m  , BMI Body mass index is 28.23 kg/m. GEN: Well nourished, well developed, in no acute distress HEENT: normal Neck: no JVD, carotid bruits, or masses Cardiac: RRR; no murmurs, rubs, or gallops,no edema  Respiratory:  clear to auscultation bilaterally, normal work of breathing GI: soft, nontender, nondistended, + BS MS: no deformity or atrophy Skin: warm and dry, no rash Neuro:  Strength and sensation are intact Psych: euthymic mood, full affect  Recent Labs: 11/12/2023: Magnesium  2.0 12/10/2023: Hemoglobin 11.8; Platelets 264.0 01/21/2024: ALT 17; BUN 15; Creatinine, Ser 0.84; Potassium 4.4; Sodium 140; TSH 0.96    Lipid Panel Lab  Results  Component Value Date   CHOL 181 01/21/2024   HDL 55.90 01/21/2024   LDLCALC 97 01/21/2024   TRIG 143.0 01/21/2024      Wt Readings from Last 3 Encounters:  02/07/24 180 lb 4 oz (81.8 kg)  01/21/24 179 lb (81.2 kg)  01/13/24 177 lb (80.3 kg)     ASSESSMENT AND PLAN:  Problem List Items Addressed This Visit   None Visit Diagnoses       Syncope, unspecified syncope type    -  Primary   Relevant Orders   EKG 12-Lead (  Completed)      Syncope Hospital records requested and reviewed from [facility], reviewed in detail Etiology of syncope unclear, unable to exclude vasovagal episode though she denies any abdominal pain or GI symptoms or prodrome That day had not had much to eat for lunch, only pack of crackers - CMP on arrival to the hospital did not support dehydration given normal renal function - No arrhythmia on 1 week of Zio monitor -Essentially normal echocardiogram - Recommend a monitoring device at home such as Apple watch or Kardia mobile device - Close monitoring of blood pressure, avoid missing meals - No further cardiac testing needed at this time     Signed, Velinda Lunger, M.D., Ph.D. [facility] [place], [place] [phone]

## 2024-02-07 ENCOUNTER — Ambulatory Visit: Attending: Cardiovascular Disease | Admitting: Cardiovascular Disease

## 2024-02-07 ENCOUNTER — Encounter: Payer: Self-pay | Admitting: Cardiovascular Disease

## 2024-02-07 VITALS — BP 122/77 | HR 69 | Ht 67.0 in | Wt 180.2 lb

## 2024-02-07 DIAGNOSIS — R55 Syncope and collapse: Secondary | ICD-10-CM | POA: Insufficient documentation

## 2024-02-07 NOTE — Patient Instructions (Signed)

## 2024-02-12 ENCOUNTER — Other Ambulatory Visit

## 2024-02-13 ENCOUNTER — Other Ambulatory Visit (INDEPENDENT_AMBULATORY_CARE_PROVIDER_SITE_OTHER)

## 2024-02-13 DIAGNOSIS — R944 Abnormal results of kidney function studies: Secondary | ICD-10-CM

## 2024-02-13 DIAGNOSIS — D649 Anemia, unspecified: Secondary | ICD-10-CM | POA: Diagnosis not present

## 2024-02-13 LAB — CBC WITH DIFFERENTIAL/PLATELET
Basophils Absolute: 0 K/uL (ref 0.0–0.1)
Basophils Relative: 0.6 % (ref 0.0–3.0)
Eosinophils Absolute: 0.1 K/uL (ref 0.0–0.7)
Eosinophils Relative: 1.3 % (ref 0.0–5.0)
HCT: 36.6 % (ref 36.0–46.0)
Hemoglobin: 12.3 g/dL (ref 12.0–15.0)
Lymphocytes Relative: 39 % (ref 12.0–46.0)
Lymphs Abs: 2.7 K/uL (ref 0.7–4.0)
MCHC: 33.5 g/dL (ref 30.0–36.0)
MCV: 81.6 fl (ref 78.0–100.0)
Monocytes Absolute: 0.5 K/uL (ref 0.1–1.0)
Monocytes Relative: 7 % (ref 3.0–12.0)
Neutro Abs: 3.6 K/uL (ref 1.4–7.7)
Neutrophils Relative %: 52.1 % (ref 43.0–77.0)
Platelets: 218 K/uL (ref 150.0–400.0)
RBC: 4.48 Mil/uL (ref 3.87–5.11)
RDW: 14.9 % (ref 11.5–15.5)
WBC: 6.9 K/uL (ref 4.0–10.5)

## 2024-02-13 LAB — BASIC METABOLIC PANEL WITH GFR
BUN: 11 mg/dL (ref 6–23)
CO2: 32 meq/L (ref 19–32)
Calcium: 9.7 mg/dL (ref 8.4–10.5)
Chloride: 103 meq/L (ref 96–112)
Creatinine, Ser: 0.73 mg/dL (ref 0.40–1.20)
GFR: 80.57 mL/min
Glucose, Bld: 93 mg/dL (ref 70–99)
Potassium: 4 meq/L (ref 3.5–5.1)
Sodium: 142 meq/L (ref 135–145)

## 2024-02-13 LAB — IBC + FERRITIN
Ferritin: 15.6 ng/mL (ref 10.0–291.0)
Iron: 51 ug/dL (ref 42–145)
Saturation Ratios: 12.8 % — ABNORMAL LOW (ref 20.0–50.0)
TIBC: 399 ug/dL (ref 250.0–450.0)
Transferrin: 285 mg/dL (ref 212.0–360.0)

## 2024-02-14 ENCOUNTER — Ambulatory Visit: Payer: Self-pay | Admitting: Internal Medicine

## 2024-02-15 ENCOUNTER — Encounter: Payer: Self-pay | Admitting: Internal Medicine

## 2024-02-15 DIAGNOSIS — M810 Age-related osteoporosis without current pathological fracture: Secondary | ICD-10-CM | POA: Insufficient documentation

## 2024-02-19 ENCOUNTER — Telehealth: Payer: Self-pay | Admitting: Internal Medicine

## 2024-02-19 MED ORDER — MUPIROCIN 2 % EX OINT: 1.0000 | TOPICAL_OINTMENT | Freq: Two times a day (BID) | CUTANEOUS | 0 refills | Status: AC

## 2024-02-19 NOTE — Telephone Encounter (Signed)
 Request refill bactroban . Rx sent in

## 2024-03-06 ENCOUNTER — Encounter: Payer: Self-pay | Admitting: Urology

## 2024-03-07 ENCOUNTER — Emergency Department

## 2024-03-07 ENCOUNTER — Observation Stay
Admission: EM | Admit: 2024-03-07 | Discharge: 2024-03-08 | Disposition: A | Attending: Internal Medicine | Admitting: Internal Medicine

## 2024-03-07 ENCOUNTER — Other Ambulatory Visit: Payer: Self-pay

## 2024-03-07 DIAGNOSIS — M51379 Other intervertebral disc degeneration, lumbosacral region without mention of lumbar back pain or lower extremity pain: Secondary | ICD-10-CM | POA: Diagnosis present

## 2024-03-07 DIAGNOSIS — K449 Diaphragmatic hernia without obstruction or gangrene: Secondary | ICD-10-CM | POA: Insufficient documentation

## 2024-03-07 DIAGNOSIS — N3281 Overactive bladder: Secondary | ICD-10-CM | POA: Insufficient documentation

## 2024-03-07 DIAGNOSIS — J9811 Atelectasis: Secondary | ICD-10-CM | POA: Insufficient documentation

## 2024-03-07 DIAGNOSIS — K219 Gastro-esophageal reflux disease without esophagitis: Secondary | ICD-10-CM | POA: Diagnosis present

## 2024-03-07 DIAGNOSIS — R9431 Abnormal electrocardiogram [ECG] [EKG]: Principal | ICD-10-CM

## 2024-03-07 DIAGNOSIS — E663 Overweight: Secondary | ICD-10-CM | POA: Diagnosis present

## 2024-03-07 DIAGNOSIS — M797 Fibromyalgia: Secondary | ICD-10-CM | POA: Diagnosis present

## 2024-03-07 DIAGNOSIS — R55 Syncope and collapse: Principal | ICD-10-CM | POA: Insufficient documentation

## 2024-03-07 DIAGNOSIS — I4581 Long QT syndrome: Secondary | ICD-10-CM | POA: Insufficient documentation

## 2024-03-07 DIAGNOSIS — M51369 Other intervertebral disc degeneration, lumbar region without mention of lumbar back pain or lower extremity pain: Secondary | ICD-10-CM | POA: Insufficient documentation

## 2024-03-07 DIAGNOSIS — E785 Hyperlipidemia, unspecified: Secondary | ICD-10-CM | POA: Insufficient documentation

## 2024-03-07 DIAGNOSIS — C8590 Non-Hodgkin lymphoma, unspecified, unspecified site: Secondary | ICD-10-CM | POA: Diagnosis present

## 2024-03-07 DIAGNOSIS — Z7982 Long term (current) use of aspirin: Secondary | ICD-10-CM | POA: Insufficient documentation

## 2024-03-07 DIAGNOSIS — M810 Age-related osteoporosis without current pathological fracture: Secondary | ICD-10-CM | POA: Diagnosis present

## 2024-03-07 DIAGNOSIS — J449 Chronic obstructive pulmonary disease, unspecified: Secondary | ICD-10-CM | POA: Insufficient documentation

## 2024-03-07 DIAGNOSIS — R42 Dizziness and giddiness: Secondary | ICD-10-CM

## 2024-03-07 DIAGNOSIS — M818 Other osteoporosis without current pathological fracture: Secondary | ICD-10-CM | POA: Insufficient documentation

## 2024-03-07 LAB — HEPATIC FUNCTION PANEL
ALT: 26 U/L (ref 0–44)
AST: 37 U/L (ref 15–41)
Albumin: 4.4 g/dL (ref 3.5–5.0)
Alkaline Phosphatase: 83 U/L (ref 38–126)
Bilirubin, Direct: 0.4 mg/dL — ABNORMAL HIGH (ref 0.0–0.2)
Indirect Bilirubin: 0.6 mg/dL (ref 0.3–0.9)
Total Bilirubin: 1 mg/dL (ref 0.0–1.2)
Total Protein: 7.6 g/dL (ref 6.5–8.1)

## 2024-03-07 LAB — LIPASE, BLOOD: Lipase: 21 U/L (ref 11–51)

## 2024-03-07 LAB — BASIC METABOLIC PANEL WITH GFR
Anion gap: 15 (ref 5–15)
BUN: 11 mg/dL (ref 8–23)
CO2: 25 mmol/L (ref 22–32)
Calcium: 9.4 mg/dL (ref 8.9–10.3)
Chloride: 100 mmol/L (ref 98–111)
Creatinine, Ser: 0.93 mg/dL (ref 0.44–1.00)
GFR, Estimated: >60 mL/min
Glucose, Bld: 184 mg/dL — ABNORMAL HIGH (ref 70–99)
Potassium: 3.5 mmol/L (ref 3.5–5.1)
Sodium: 140 mmol/L (ref 135–145)

## 2024-03-07 LAB — RESPIRATORY PANEL BY PCR

## 2024-03-07 LAB — RESP PANEL BY RT-PCR (RSV, FLU A&B, COVID)  RVPGX2
Influenza A by PCR: NEGATIVE
Influenza B by PCR: NEGATIVE
Resp Syncytial Virus by PCR: NEGATIVE
SARS Coronavirus 2 by RT PCR: NEGATIVE

## 2024-03-07 LAB — CBC
HCT: 41.5 % (ref 36.0–46.0)
Hemoglobin: 13.1 g/dL (ref 12.0–15.0)
MCH: 27.1 pg (ref 26.0–34.0)
MCHC: 31.6 g/dL (ref 30.0–36.0)
MCV: 85.9 fL (ref 80.0–100.0)
Platelets: 224 10*3/uL (ref 150–400)
RBC: 4.83 MIL/uL (ref 3.87–5.11)
RDW: 14.9 % (ref 11.5–15.5)
WBC: 15.6 10*3/uL — ABNORMAL HIGH (ref 4.0–10.5)
nRBC: 0 % (ref 0.0–0.2)

## 2024-03-07 LAB — TROPONIN T, HIGH SENSITIVITY
Troponin T High Sensitivity: 12 ng/L (ref 0–19)
Troponin T High Sensitivity: 9 ng/L (ref 0–19)

## 2024-03-07 LAB — URINALYSIS, ROUTINE W REFLEX MICROSCOPIC
Bacteria, UA: NONE SEEN
Bilirubin Urine: NEGATIVE
Glucose, UA: NEGATIVE mg/dL
Ketones, ur: NEGATIVE mg/dL
Leukocytes,Ua: NEGATIVE
Nitrite: NEGATIVE
Protein, ur: NEGATIVE mg/dL
Specific Gravity, Urine: >1.046 — ABNORMAL HIGH (ref 1.005–1.030)
pH: 6 (ref 5.0–8.0)

## 2024-03-07 LAB — PRO BRAIN NATRIURETIC PEPTIDE: Pro Brain Natriuretic Peptide: 218 pg/mL

## 2024-03-07 LAB — MAGNESIUM: Magnesium: 1.1 mg/dL — ABNORMAL LOW (ref 1.7–2.4)

## 2024-03-07 MED ORDER — MAGNESIUM SULFATE 2 GM/50ML IV SOLN: 2.0000 g | Freq: Once | INTRAVENOUS | Status: DC

## 2024-03-07 MED ORDER — ACETAMINOPHEN 650 MG RE SUPP: 650.0000 mg | Freq: Four times a day (QID) | RECTAL | Status: DC | PRN

## 2024-03-07 MED ORDER — OXYBUTYNIN CHLORIDE ER 10 MG PO TB24
10.0000 mg | ORAL_TABLET | Freq: Every day | ORAL | Status: DC
  Administered 2024-03-08: 10 mg via ORAL
  Filled 2024-03-07: qty 1

## 2024-03-07 MED ORDER — ASPIRIN 81 MG PO TBEC
81.0000 mg | DELAYED_RELEASE_TABLET | Freq: Every day | ORAL | Status: DC
  Administered 2024-03-08: 81 mg via ORAL
  Filled 2024-03-07: qty 1

## 2024-03-07 MED ORDER — SENNOSIDES-DOCUSATE SODIUM 8.6-50 MG PO TABS: 1.0000 | ORAL_TABLET | Freq: Every evening | ORAL | Status: DC | PRN

## 2024-03-07 MED ORDER — IOHEXOL 350 MG/ML SOLN
75.0000 mL | Freq: Once | INTRAVENOUS | Status: AC | PRN
  Administered 2024-03-07: 75 mL via INTRAVENOUS

## 2024-03-07 MED ORDER — POTASSIUM CHLORIDE 20 MEQ PO PACK
60.0000 meq | PACK | Freq: Once | ORAL | Status: AC
  Administered 2024-03-07: 60 meq via ORAL
  Filled 2024-03-07: qty 3

## 2024-03-07 MED ORDER — PANTOPRAZOLE SODIUM 40 MG PO TBEC
40.0000 mg | DELAYED_RELEASE_TABLET | Freq: Every day | ORAL | Status: DC
  Administered 2024-03-07: 40 mg via ORAL
  Filled 2024-03-07: qty 1

## 2024-03-07 MED ORDER — LIDOCAINE 5 % EX PTCH
1.0000 | MEDICATED_PATCH | CUTANEOUS | Status: DC
  Administered 2024-03-07 – 2024-03-08 (×3): 1 via TRANSDERMAL
  Filled 2024-03-07 (×2): qty 1

## 2024-03-07 MED ORDER — ACETAMINOPHEN 325 MG PO TABS
650.0000 mg | ORAL_TABLET | Freq: Four times a day (QID) | ORAL | Status: DC | PRN
  Administered 2024-03-07 – 2024-03-08 (×2): 650 mg via ORAL
  Filled 2024-03-07 (×2): qty 2

## 2024-03-07 MED ORDER — SODIUM CHLORIDE 0.9 % IV BOLUS
500.0000 mL | Freq: Once | INTRAVENOUS | Status: AC
  Administered 2024-03-07: 500 mL via INTRAVENOUS

## 2024-03-07 MED ORDER — MAGNESIUM SULFATE 4 GM/100ML IV SOLN
4.0000 g | Freq: Once | INTRAVENOUS | Status: AC
  Administered 2024-03-07: 4 g via INTRAVENOUS
  Filled 2024-03-07: qty 100

## 2024-03-07 MED ORDER — FENTANYL CITRATE (PF) 50 MCG/ML IJ SOSY
50.0000 ug | PREFILLED_SYRINGE | Freq: Once | INTRAMUSCULAR | Status: AC
  Administered 2024-03-07: 50 ug via INTRAVENOUS
  Filled 2024-03-07: qty 1

## 2024-03-07 MED ORDER — SODIUM CHLORIDE 0.9% FLUSH
3.0000 mL | Freq: Two times a day (BID) | INTRAVENOUS | Status: DC
  Administered 2024-03-07 – 2024-03-08 (×3): 3 mL via INTRAVENOUS

## 2024-03-07 MED ORDER — ONDANSETRON HCL 4 MG/2ML IJ SOLN: 4.0000 mg | Freq: Once | INTRAMUSCULAR | Status: DC

## 2024-03-07 MED ORDER — ENOXAPARIN SODIUM 40 MG/0.4ML IJ SOSY
40.0000 mg | PREFILLED_SYRINGE | INTRAMUSCULAR | Status: DC
  Administered 2024-03-07: 40 mg via SUBCUTANEOUS
  Filled 2024-03-07: qty 0.4

## 2024-03-07 MED ORDER — ATORVASTATIN CALCIUM 20 MG PO TABS
10.0000 mg | ORAL_TABLET | Freq: Every day | ORAL | Status: DC
  Administered 2024-03-07: 10 mg via ORAL
  Filled 2024-03-07: qty 1

## 2024-03-07 NOTE — ED Notes (Signed)
 Pt asleep unable to perform orthostatic VS.

## 2024-03-07 NOTE — ED Notes (Addendum)
 Ambulatory oxygen  saturation 90-93% at this time on RA. Pt denies dizziness, reports DOE.

## 2024-03-07 NOTE — ED Triage Notes (Signed)
 Pt c/o right sided CP that radiates to back since 11 pm yesterday night as well as intermittent lightheadedness. Pt had an infusion for osteoporosis yesterday- Reclast.  12 lead unremarkable 28 co2 113/55 92% sitting- desaturated while walking no prior o2 use [id number]

## 2024-03-07 NOTE — ED Notes (Signed)
 Pt fall risk due to age. Fall band placed, bed alarm placed on.

## 2024-03-07 NOTE — ED Provider Notes (Signed)
 "  [facility] Provider Note    Event Date/Time   First MD Initiated Contact with Patient 03/07/24 [phone]     (approximate)   History   Chest Pain   HPI  Maria Keller is a 74 y.o. female with history of osteoporosis, prior syncopal episode this past year that resulted in a broken femur who comes in with concerns for chest pain.  Patient reports chest pain that started around 11 PM on the right side of her chest.  She reports feeling short of breath and having a little bit of cough.  She states that she did try to get up and walk and felt lightheaded like she was going to pass out had an episode of diarrhea and felt very nauseous and clammy.  With EMS patient was noted to be desatting with ambulation with a baseline saturation of 91-92% when at rest.   Patient does report having a brief loss of effusion done yesterday.  Physical Exam   Triage Vital Signs: ED Triage Vitals  Encounter Vitals Group     BP --      Girls Systolic BP Percentile --      Girls Diastolic BP Percentile --      Boys Systolic BP Percentile --      Boys Diastolic BP Percentile --      Pulse --      Resp --      Temp --      Temp src --      SpO2 --      Weight 03/07/24 0744 180 lb (81.6 kg)     Height 03/07/24 0744 5' 7 (1.702 m)     Head Circumference --      Peak Flow --      Pain Score 03/07/24 0745 5     Pain Loc --      Pain Education --      Exclude from Growth Chart --     Most recent vital signs: Vitals:   03/07/24 0750 03/07/24 0815  BP:    Pulse: (!) 108 (!) 104  Resp: 20   Temp:    SpO2: 95% 93%     General: Awake, no distress.  CV:  Good peripheral perfusion.  Tachycardic Resp:  Normal effort.  Clear lung Abd:  No distention.  Soft and nontender Other:  Trace edema bilaterally   ED Results / Procedures / Treatments   Labs (all labs ordered are listed, but only abnormal results are displayed) Labs Reviewed  BASIC METABOLIC PANEL WITH GFR -  Abnormal; Notable for the following components:      Result Value   Glucose, Bld 184 (*)    All other components within normal limits  CBC - Abnormal; Notable for the following components:   WBC 15.6 (*)    All other components within normal limits  HEPATIC FUNCTION PANEL - Abnormal; Notable for the following components:   Bilirubin, Direct 0.4 (*)    All other components within normal limits  RESP PANEL BY RT-PCR (RSV, FLU A&B, COVID)  RVPGX2  LIPASE, BLOOD  PRO BRAIN NATRIURETIC PEPTIDE  URINALYSIS, ROUTINE W REFLEX MICROSCOPIC  TROPONIN T, HIGH SENSITIVITY     EKG  My interpretation of EKG:  Sinus tachycardia with a rate of 100 without any ST elevation or T wave inversions with QTc of 525  Sinus tachycardia rate of 108 without any ST elevation or T wave inversions with QTc of 526  RADIOLOGY  I have reviewed the xray personally and interpreted no PNA     PROCEDURES:  Critical Care performed: no   .1-3 Lead EKG Interpretation  Performed by: Ernest Ronal BRAVO, MD Authorized by: Ernest Ronal BRAVO, MD     Interpretation: abnormal     ECG rate:  100   ECG rate assessment: tachycardic     Rhythm: sinus tachycardia     Ectopy: none     Conduction: normal      MEDICATIONS ORDERED IN ED: Medications  lidocaine  (LIDODERM ) 5 % 1 patch (1 patch Transdermal Patch Applied 03/07/24 1223)  fentaNYL  (SUBLIMAZE ) injection 50 mcg (50 mcg Intravenous Given 03/07/24 0816)  iohexol  (OMNIPAQUE ) 350 MG/ML injection 75 mL (75 mLs Intravenous Contrast Given 03/07/24 1008)  sodium chloride  0.9 % bolus 500 mL (500 mLs Intravenous New Bag/Given 03/07/24 1223)     IMPRESSION / MDM / ASSESSMENT AND PLAN / ED COURSE  I reviewed the triage vital signs and the nursing notes.   Patient's presentation is most consistent with acute presentation with potential threat to life or bodily function.    Differential includes COVID, flu, pneumonia, ACS.  Patient given some fentanyl  for pain.  COVID flu are  negative.  BMP reassuring CBC elevated white count troponin was negative hepatic function was normal lipase normal BNP normal.  Given hypoxia with chest pain we will get CT PE to ensure no evidence of pulmonary embolism.  Troponins are negative x 2.  COVID and flu are negative BMP shows slightly elevated glucose but no evidence of DKA.  CBC shows elevated white count.  Lipase normal BNP normal  MPRESSION: 1. No evidence of pulmonary embolism. 2. Mild bilateral lower lobe linear scarring and/or atelectasis. 3. Small hiatal hernia.   Patient calcium  level was reviewed and was normal.  Unclear what could be the cause of patient's symptoms.  She did have this Reclast infusion so could be just an acute phase reaction from it.  Her amatory sat is reassuring above 90%.  I see where her oxygen  levels checked at the end of January were 96% in December were 95%  1:27 PM patient was not hypoxic with ambulation but feeling lightheaded and dizzy has a history of syncope her QTc is prolonged with hypomagnesia.  Will discuss to hospital team for admission.  The patient is on the cardiac monitor to evaluate for evidence of arrhythmia and/or significant heart rate changes.      FINAL CLINICAL IMPRESSION(S) / ED DIAGNOSES   Final diagnoses:  QT prolongation  Hypomagnesemia     Rx / DC Orders   ED Discharge Orders     None        Note:  This document was prepared using Dragon voice recognition software and may include unintentional dictation errors.   Ernest Ronal BRAVO, MD 03/07/24 1328  "

## 2024-03-07 NOTE — ED Notes (Signed)
 Report given to flex RN. Endorsed need for orthostatic VS and respiratory swab.

## 2024-03-07 NOTE — H&P (Addendum)
 " History and Physical    Maria Keller FMW:[id number] DOB: 02-Dec-1950 DOA: 03/07/2024  DOS: the patient was seen and examined on 03/07/2024  PCP: Glendia Shad, MD   Patient coming from: Home  I have personally briefly reviewed patient's old medical records in [facility] and CareEverywhere  HPI:   Maria Keller is a 74 y.o. year old female with medical history of hyperlipidemia, osteoporosis, GERD, history of non-Hodgkin's lymphoma presenting to the ED with chest pain and lightheadedness.  Patient received zoledronic acid infusion yesterday which was the first time getting the infusion.  She reports some exertional dyspnea along with coughing along with myalgias and chest pain.  She is also complaining of diffuse joint pains. She had one episodes of diarrhea. Denies any fevers but states she has been having chills after the infusion. States lightheadedness was upon standing. On arrival to the ED patient was noted to be HDS stable.  Lab work and imaging obtained.  CBC with moderate leukocytosis, otherwise unremarkable.  BMP unremarkable except for moderate hyperglycemia.  Hepatic function normal.  Troponin checked and normal x 2.  proBNP normal.  Respiratory panel negative.  Magnesium  checked and low at 1.1.  Patient with tachycardia along with lightheadedness.  Given previous syncopal episode, prolonged QTc on EKG TRH consulted for admission.  Prior to admission I requested patient to get orthostatic vitals and further repletion of magnesium .  Review of Systems: As mentioned in the history of present illness. All other systems reviewed and are negative.   Past Medical History:  Diagnosis Date   Allergy    Anemia    iron deficiency and B12 deficiency   Arthritis    Blood transfusion without reported diagnosis    Bronchitis 08/2015   Chronic back pain    followed by Dr Dannial   COPD (chronic obstructive pulmonary disease) (HCC)    Depression    Diverticulosis    Esophageal stricture     requiring dilatation x 2   Fibromyalgia    Fibromyalgia    Fibromyalgia    Follicular lymphoma (HCC)    Followed by Dr DeCastro, s/p chemo and XRT   Follicular lymphoma (HCC)    GERD (gastroesophageal reflux disease)    GERD (gastroesophageal reflux disease)    Hemorrhoid    Hiatal hernia    History of kidney stones    Hypercholesterolemia    IBS (irritable bowel syndrome)    IBS (irritable bowel syndrome)    Nephrolithiasis    followed by Dr Twylla, s/p stents   PONV (postoperative nausea and vomiting)    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   Shingles outbreak 11/26/2011   Sleep apnea     Past Surgical History:  Procedure Laterality Date   APPENDECTOMY     BIOPSY  11/05/2022   Procedure: BIOPSY;  Surgeon: Maryruth Ole DASEN, MD;  Location: [facility] ENDOSCOPY;  Service: Endoscopy;;   BLADDER SURGERY  1993   BREAST BIOPSY N/A    capsule endoscopy     CHOLECYSTECTOMY  1995   COLONOSCOPY WITH PROPOFOL  N/A 06/19/2017   Procedure: COLONOSCOPY WITH PROPOFOL ;  Surgeon: Viktoria Lamar DASEN, MD;  Location: [facility] ENDOSCOPY;  Service: Endoscopy;  Laterality: N/A;   COLONOSCOPY WITH PROPOFOL  N/A 11/05/2022   Procedure: COLONOSCOPY WITH PROPOFOL ;  Surgeon: Maryruth Ole DASEN, MD;  Location: [facility] ENDOSCOPY;  Service: Endoscopy;  Laterality: N/A;   ESOPHAGOGASTRODUODENOSCOPY (EGD) WITH PROPOFOL  N/A 06/19/2017   Procedure: ESOPHAGOGASTRODUODENOSCOPY (EGD)  WITH PROPOFOL ;  Surgeon: Viktoria Lamar DASEN, MD;  Location: [facility] ENDOSCOPY;  Service: Endoscopy;  Laterality: N/A;   ESOPHAGOGASTRODUODENOSCOPY (EGD) WITH PROPOFOL  N/A 11/05/2022   Procedure: ESOPHAGOGASTRODUODENOSCOPY (EGD) WITH PROPOFOL ;  Surgeon: Maryruth Ole DASEN, MD;  Location: [facility] ENDOSCOPY;  Service: Endoscopy;  Laterality: N/A;   EYE SURGERY     both cataract remove   FRACTURE SURGERY Left    ankle, plate with 8 screws   history of helicobacter pylori infection     JOINT  REPLACEMENT Right    knee   LEFT OOPHORECTOMY     LEG SURGERY Left    ORIF ANKLE FRACTURE  2012   OVARIAN CYST REMOVAL     right   POLYPECTOMY  11/05/2022   Procedure: POLYPECTOMY;  Surgeon: Maryruth Ole DASEN, MD;  Location: [facility] ENDOSCOPY;  Service: Endoscopy;;   RECTOCELE REPAIR N/A    REPLACEMENT TOTAL KNEE Right 03/19/2016   TONSILECTOMY/ADENOIDECTOMY WITH MYRINGOTOMY  1970   TONSILLECTOMY N/A    TOTAL HIP ARTHROPLASTY Right 10/2023   TOTAL KNEE REVISION Right 11/07/2021   Procedure: TOTAL KNEE REVISION;  Surgeon: Edie Norleen PARAS, MD;  Location: [facility] ORS;  Service: Orthopedics;  Laterality: Right;   TUBAL LIGATION  1976   VAGINAL HYSTERECTOMY  1980's   secondary to bleeding     Allergies[1]  Family History  Problem Relation Age of Onset   Ulcers Mother    Ulcers Father    Breast cancer Other        great aunt and grandfather   Lung cancer Other        uncle   Hypertension Maternal Grandmother    Heart disease Maternal Grandmother        MI 29s   Diabetes Maternal Grandmother    Arthritis Maternal Grandmother    Diabetes Maternal Grandfather    Cancer Brother    Cancer Maternal Aunt    Miscarriages / Stillbirths Sister    COPD Brother    ADD / ADHD Brother     Prior to Admission medications  Medication Sig Start Date End Date Taking? Authorizing Provider  acetaminophen  (TYLENOL ) 650 MG CR tablet Take 650 mg by mouth every 8 (eight) hours as needed for pain.    [provider]  aspirin  EC 81 MG tablet Take 81 mg by mouth daily. 03/17/10   [provider]  atorvastatin  (LIPITOR) 10 MG tablet Take 1 tablet (10 mg total) by mouth daily. 08/01/23   Glendia Shad, MD  Calcium  Carb-Cholecalciferol  (CALCIUM -VITAMIN D  PO) Take by mouth daily.    [provider]  cetirizine (ZYRTEC) 10 MG tablet Take by mouth.    [provider]  estradiol  (ESTRACE ) 0.1 MG/GM vaginal cream Estrogen Cream Instruction Discard applicator Apply pea sized  amount to tip of finger to urethra before bed. Wash hands well after application. Use Monday, Wednesday and Friday 05/30/23   Penne Knee, MD  furosemide  (LASIX ) 20 MG tablet Take 1 tablet (20 mg total) by mouth daily as needed. 01/31/24   Glendia Shad, MD  Multiple Vitamin (MULTIVITAMIN) tablet Take 1 tablet by mouth daily.    [provider]  mupirocin  ointment (BACTROBAN ) 2 % Apply 1 Application topically 2 (two) times daily. 02/19/24   Glendia Shad, MD  omeprazole  (PRILOSEC) 20 MG capsule Take 1 capsule (20 mg total) by mouth 2 (two) times daily. 01/31/24   Glendia Shad, MD  oxybutynin  (DITROPAN -XL) 10 MG 24 hr tablet Take 1 tablet (10 mg total) by mouth daily. 01/06/24  MacDiarmid, Scott, MD  polyethylene glycol (MIRALAX / GLYCOLAX) 17 g packet Take 17 g by mouth daily. 10/31/23   [provider]  triamcinolone  (NASACORT  ALLERGY 24HR) 55 MCG/ACT AERO nasal inhaler Place 1 spray into the nose daily.    [provider]    Social History:  reports that she has never smoked. She has never used smokeless tobacco. She reports that she does not drink alcohol and does not use drugs.    Physical Exam: Vitals:   03/07/24 1202 03/07/24 1230 03/07/24 1300 03/07/24 1330  BP: (!) 113/52 (!) 120/46 (!) 113/48 (!) 116/47  Pulse: (!) 101 99 92 95  Resp: 18 18 17 16   Temp: 99 F (37.2 C)     TempSrc: Oral     SpO2: 91% 91% 97% 96%  Weight:      Height:        Gen: NAD HENT: NCAT CV: RRR, good pulses Lung: CTAB Abd: No TTP, normal bowel sounds MSK: No asymmetry, good bulk and tone Neuro: alert and oriented   Labs on Admission: I have personally reviewed following labs and imaging studies  CBC: Recent Labs  Lab 03/07/24 0746  WBC 15.6*  HGB 13.1  HCT 41.5  MCV 85.9  PLT 224   Basic Metabolic Panel: Recent Labs  Lab 03/07/24 0746 03/07/24 0956  NA 140  --   K 3.5  --   CL 100  --   CO2 25  --   GLUCOSE 184*  --   BUN 11  --    CREATININE 0.93  --   CALCIUM  9.4  --   MG  --  1.1*   GFR: Estimated Creatinine Clearance: 57.4 mL/min (by C-G formula based on SCr of 0.93 mg/dL). Liver Function Tests: Recent Labs  Lab 03/07/24 0746  AST 37  ALT 26  ALKPHOS 83  BILITOT 1.0  PROT 7.6  ALBUMIN 4.4   Recent Labs  Lab 03/07/24 0746  LIPASE 21   No results for input(s): AMMONIA in the last 168 hours. Coagulation Profile: No results for input(s): INR, PROTIME in the last 168 hours. Cardiac Enzymes: No results for input(s): CKTOTAL, CKMB, CKMBINDEX, TROPONINI, TROPONINIHS in the last 168 hours. BNP (last 3 results) No results for input(s): BNP in the last 8760 hours. HbA1C: No results for input(s): HGBA1C in the last 72 hours. CBG: No results for input(s): GLUCAP in the last 168 hours. Lipid Profile: No results for input(s): CHOL, HDL, LDLCALC, TRIG, CHOLHDL, LDLDIRECT in the last 72 hours. Thyroid  Function Tests: No results for input(s): TSH, T4TOTAL, FREET4, T3FREE, THYROIDAB in the last 72 hours. Anemia Panel: No results for input(s): VITAMINB12, FOLATE, FERRITIN, TIBC, IRON, RETICCTPCT in the last 72 hours. Urine analysis:    Component Value Date/Time   COLORURINE YELLOW 05/03/2023 1037   APPEARANCEUR Clear 12/16/2023 0825   LABSPEC 1.015 05/03/2023 1037   PHURINE 6.0 05/03/2023 1037   GLUCOSEU Negative 12/16/2023 0825   GLUCOSEU NEGATIVE 05/03/2023 1037   HGBUR LARGE (A) 05/03/2023 1037   BILIRUBINUR Negative 12/16/2023 0825   KETONESUR negative 10/18/2023 0942   KETONESUR NEGATIVE 05/03/2023 1037   PROTEINUR Negative 12/16/2023 0825   PROTEINUR 30 (A) 10/26/2021 1132   UROBILINOGEN 0.2 10/18/2023 0942   UROBILINOGEN 0.2 05/03/2023 1037   NITRITE Negative 12/16/2023 0825   NITRITE NEGATIVE 05/03/2023 1037   LEUKOCYTESUR Negative 12/16/2023 0825   LEUKOCYTESUR LARGE (A) 05/03/2023 1037    Radiological Exams on Admission: I have  personally reviewed images  CT ABDOMEN PELVIS WO CONTRAST Result Date: 03/07/2024 EXAM: CT ABDOMEN AND PELVIS WITH CONTRAST 03/07/2024 12:15:05 PM TECHNIQUE: CT of the abdomen and pelvis was performed with the administration of intravenous contrast. Multiplanar reformatted images are provided for review. Automated exposure control, iterative reconstruction, and/or weight-based adjustment of the mA/kV was utilized to reduce the radiation dose to as low as reasonably achievable. COMPARISON: None available. CLINICAL HISTORY: Abdominal pain, acute, nonlocalized. Acute, nonlocalized abdominal pain. FINDINGS: LOWER CHEST: Scar versus subsegmental atelectasis identified within the posterior lower lobes. No pleural effusion or consolidative change. LIVER: A cyst within the anterior right lobe of the liver measures 2 cm, image 27/2. GALLBLADDER AND BILE DUCTS: Status post cholecystectomy. No bile duct dilatation. SPLEEN: The spleen is within normal limits in size and appearance. PANCREAS: No acute abnormality. ADRENAL GLANDS: No acute abnormality. KIDNEYS, URETERS AND BLADDER: There is an exophytic cyst of the upper pole of the left kidney measuring 1.6 cm compatible with a benign Bosniak class I cyst. No follow-up imaging recommended unless otherwise clinically indicated or there is a history of malignancy. Contrast material is identified within the collecting systems of both kidneys and the ureter There is also contrast filling the urinary bladder. No stones in the kidneys or ureters. No hydronephrosis. No perinephric or periureteral stranding. GI AND BOWEL: Tiny hiatal hernia. Sigmoid diverticula without signs of acute diverticulitis. Status post appendectomy. There is no bowel obstruction. PERITONEUM AND RETROPERITONEUM: No free fluid or fluid collections. No free air. VASCULATURE: Aorta is normal in caliber. LYMPH NODES: No lymphadenopathy. REPRODUCTIVE ORGANS: No acute abnormality. BONES AND SOFT TISSUES: Previous  right hip arthroplasty. No acute osseous abnormality. No focal soft tissue abnormality. IMPRESSION: 1. No CT evidence of an acute abdominopelvic abnormality to explain the reported pain. Electronically signed by: Waddell Calk MD 03/07/2024 12:42 PM EST RP Workstation: [id number]   CT Angio Chest PE W and/or Wo Contrast Result Date: 03/07/2024 CLINICAL DATA:  Right-sided chest pain that radiates to the back with lightheadedness. EXAM: CT ANGIOGRAPHY CHEST WITH CONTRAST TECHNIQUE: Multidetector CT imaging of the chest was performed using the standard protocol during bolus administration of intravenous contrast. Multiplanar CT image reconstructions and MIPs were obtained to evaluate the vascular anatomy. RADIATION DOSE REDUCTION: This exam was performed according to the departmental dose-optimization program which includes automated exposure control, adjustment of the mA and/or kV according to patient size and/or use of iterative reconstruction technique. CONTRAST:  75mL OMNIPAQUE  IOHEXOL  350 MG/ML SOLN COMPARISON:  None Available. FINDINGS: Cardiovascular: Satisfactory opacification of the pulmonary arteries to the segmental level. No evidence of pulmonary embolism. Normal heart size. No pericardial effusion. Mediastinum/Nodes: No enlarged mediastinal, hilar, or axillary lymph nodes. Thyroid  gland, trachea, and esophagus demonstrate no significant findings. Lungs/Pleura: There is mild bilateral lower lobe linear scarring and/or atelectasis. No pleural effusion or pneumothorax is identified. Upper Abdomen: There is a small hiatal hernia. Musculoskeletal: Surgical clips are seen within the upper outer quadrant of the right breast. No acute osseous abnormalities are identified. Review of the MIP images confirms the above findings. IMPRESSION: 1. No evidence of pulmonary embolism. 2. Mild bilateral lower lobe linear scarring and/or atelectasis. 3. Small hiatal hernia. Electronically Signed   By: Suzen Dials M.D.    On: 03/07/2024 10:58   DG Chest 2 View Result Date: 03/07/2024 EXAM: 2 VIEW(S) XRAY OF THE CHEST 03/07/2024 08:06:00 AM COMPARISON: 04/09/2022 CLINICAL HISTORY: Chest pain. FINDINGS: LUNGS AND PLEURA: No focal pulmonary opacity. No pleural effusion. No pneumothorax. HEART AND MEDIASTINUM: No acute  abnormality of the cardiac and mediastinal silhouettes. BONES AND SOFT TISSUES: Surgical clips in right breast. Degenerative changes of spine. IMPRESSION: 1. No acute findings. Electronically signed by: Waddell Calk MD 03/07/2024 08:48 AM EST RP Workstation: [id number]    EKG: My personal interpretation of EKG shows: Sinus rhythm without negative for changes.  Prolonged QTc interval.    Assessment/Plan Principal Problem:   Postural dizziness with presyncope Active Problems:   DDD (degenerative disc disease), lumbosacral   Gastro-esophageal reflux disease without esophagitis   Fibromyalgia   Non-Hodgkin's lymphoma (HCC)   Overweight (BMI 25.0-29.9)   Hypomagnesemia   Osteoporosis   Patient with postural dizziness along with tachycardia likely secondary to dehydration.  Orthostatic vitals ordered.  Patient getting IVF.  She had a syncopal episode previously that was worked up from cardiology standpoint and workup came back normal.  Symptoms of myalgias, joint pains and coughing may be secondary to URI versus reaction to reclast infusion.  Will continue to monitor her symptoms after fluid resuscitation.  4 Plex respiratory panel negative.  Will get extended viral panel.  Will continue supportive care.  Monitor for fevers,, trend leukocyte count.  There is a leukocytosis which is likely secondary to Solu-Medrol  given by EMS.  This also explains her hyperglycemia.  Will consult PT.  Prolonged QTc: Patient with severe hypomagnesemia.  Getting repletion.  Will repeat EKG once magnesium  and potassium are repleted.  Paroxysmal Afib?  Patient with previous Zio monitor that was normal.  Cardiology  recommended a monitor device such as smart watch or Kardia.  Patient with A-fib detected by smart watch.  She does have elevated Chad-Vasc score so will consult cardiology to see if there are any further recommendations.  This episode may be in setting of severe hypomagnesemia.  Will repeat potassium and magnesium  for goal K greater than 4 and magnesium  greater than 2.  Leukocytosis: likely steroid mediated. Improvement in symptoms is likely secondary to steroid as it has shown to be beneficial in decreasing symptom burden of reclast infusion.   Chronic Problems: Restart home meds once medication reconciliation is completed.  HLD: continue home meds GERD: continue home PPI Osteoporosis: Patient status post Reclast.  Continue outpatient therapy. Discussed with pt chance of reaction decreases with subsequent infusions.  Overactive bladder: continue home regimen  VTE prophylaxis:  Lovenox   Diet: Regular Code Status:  Full Code Telemetry:  Admission status: Observation, Telemetry bed Patient is from: Home Anticipated d/c is to: Home Anticipated d/c is in: 1-2 days   Family Communication: Updated at bedside  Consults called: Cardiology   Severity of Illness: The appropriate patient status for this patient is OBSERVATION. Observation status is judged to be reasonable and necessary in order to provide the required intensity of service to ensure the patient's safety. The patient's presenting symptoms, physical exam findings, and initial radiographic and laboratory data in the context of their medical condition is felt to place them at decreased risk for further clinical deterioration. Furthermore, it is anticipated that the patient will be medically stable for discharge from the hospital within 2 midnights of admission.    Morene Bathe, MD [facility]     [1]  Allergies Allergen Reactions   Oxycontin  [Oxycodone  Hcl] Itching    Sleep Apnea   Penicillins Anaphylaxis  and Itching   Chloral Hydrate Hives    Other Reaction: Not Assessed   Ciprofloxacin Itching   Colace [Docusate Calcium ]    Docusate Sodium     Methotrexate And Trimetrexate Other (  See Comments)    Questionable    Parafon Forte Dsc [Chlorzoxazone] Itching and Other (See Comments)    Other Reaction: Not Assessed   Penicillin G Hives   Tape Other (See Comments)    adhesive  tape cause redness and skin to peel   Bextra [Valdecoxib] Itching    Other reaction(s): Other (See Comments) Other Reaction: Not Assessed   Oxycodone  Itching   Rofecoxib Itching    Other reaction(s): Headache (finding)   "

## 2024-03-08 DIAGNOSIS — R9431 Abnormal electrocardiogram [ECG] [EKG]: Principal | ICD-10-CM

## 2024-03-08 LAB — BASIC METABOLIC PANEL WITH GFR
Anion gap: 9 (ref 5–15)
BUN: 11 mg/dL (ref 8–23)
CO2: 24 mmol/L (ref 22–32)
Calcium: 8.2 mg/dL — ABNORMAL LOW (ref 8.9–10.3)
Chloride: 103 mmol/L (ref 98–111)
Creatinine, Ser: 0.83 mg/dL (ref 0.44–1.00)
GFR, Estimated: >60 mL/min
Glucose, Bld: 115 mg/dL — ABNORMAL HIGH (ref 70–99)
Potassium: 4.5 mmol/L (ref 3.5–5.1)
Sodium: 136 mmol/L (ref 135–145)

## 2024-03-08 LAB — CBC
HCT: 35.1 % — ABNORMAL LOW (ref 36.0–46.0)
Hemoglobin: 11.1 g/dL — ABNORMAL LOW (ref 12.0–15.0)
MCH: 27.2 pg (ref 26.0–34.0)
MCHC: 31.6 g/dL (ref 30.0–36.0)
MCV: 86 fL (ref 80.0–100.0)
Platelets: 170 10*3/uL (ref 150–400)
RBC: 4.08 MIL/uL (ref 3.87–5.11)
RDW: 15.6 % — ABNORMAL HIGH (ref 11.5–15.5)
WBC: 7.6 10*3/uL (ref 4.0–10.5)
nRBC: 0 % (ref 0.0–0.2)

## 2024-03-08 LAB — MAGNESIUM: Magnesium: 2.8 mg/dL — ABNORMAL HIGH (ref 1.7–2.4)

## 2024-03-08 LAB — CBG MONITORING, ED: Glucose-Capillary: 104 mg/dL — ABNORMAL HIGH (ref 70–99)

## 2024-03-08 MED ORDER — MAGNESIUM OXIDE -MG SUPPLEMENT 400 (240 MG) MG PO TABS: 400.0000 mg | ORAL_TABLET | Freq: Two times a day (BID) | ORAL | 0 refills | Status: AC

## 2024-03-08 MED ORDER — MAGNESIUM OXIDE -MG SUPPLEMENT 400 (240 MG) MG PO TABS
400.0000 mg | ORAL_TABLET | Freq: Two times a day (BID) | ORAL | Status: DC
  Administered 2024-03-08: 400 mg via ORAL
  Filled 2024-03-08: qty 1

## 2024-03-08 NOTE — Progress Notes (Signed)
 PHARMACY CONSULT NOTE - ELECTROLYTES  Pharmacy Consult for Electrolyte Monitoring and Replacement   Recent Labs: Height: 5' 7 (170.2 cm) Weight: 81.6 kg (180 lb) IBW/kg (Calculated) : 61.6 Estimated Creatinine Clearance: 64.3 mL/min (by C-G formula based on SCr of 0.83 mg/dL). Potassium (mmol/L)  Date Value  03/08/2024 4.5  11/06/2012 4.0   Magnesium  (mg/dL)  Date Value  97/98/7973 2.8 (H)   Calcium  (mg/dL)  Date Value  97/98/7973 8.2 (L)   Calcium , Total (mg/dL)  Date Value  89/97/7985 9.0   Albumin (g/dL)  Date Value  98/68/7973 4.4  10/12/2021 4.8   Sodium  Date Value  03/08/2024 136 mmol/L  10/16/2021 142  11/06/2012 138 mmol/L    Assessment  Maria Keller is a 74 y.o. female presenting with chest pain and lightheadedness. PMH significant for hyperlipidemia, osteoporosis, GERD, history of non-Hodgkin's lymphoma. Pharmacy has been consulted to monitor and replace electrolytes.  Diet: PO MIVF: None Pertinent medications: mag oxide 400 mg bid  Goal of Therapy: Electrolytes WNL  Plan:  No supplementation needed Check BMP, Mg, Phos with AM labs  Thank you for allowing pharmacy to be a part of this patient's care.  Lum VEAR Mania, PharmD Clinical Pharmacist 03/08/2024 12:20 PM

## 2024-03-08 NOTE — Discharge Summary (Signed)
 " Physician Discharge Summary   Patient: Maria Keller MRN: [id number] DOB: Oct 17, 1950  Admit date:     03/07/2024  Discharge date: 03/08/24  Discharge Physician: Maria Keller   PCP: Maria Shad, MD   Recommendations at discharge:   F/u [facility] cardiology in 1-2 weeks F/u PCP in 1-2 weeks Pt's family to pick up zio monitor from the office next week  Discharge Diagnoses: Principal Problem:   Postural dizziness with presyncope Active Problems:   DDD (degenerative disc disease), lumbosacral   Gastro-esophageal reflux disease without esophagitis   Fibromyalgia   Non-Hodgkin's lymphoma (HCC)   Overweight (BMI 25.0-29.9)   Hypomagnesemia   Osteoporosis  Maria Keller is a 74 y.o. year old female with medical history of hyperlipidemia, osteoporosis, GERD, history of non-Hodgkin's lymphoma presenting to the ED with chest pain and lightheadedness.  Patient received zoledronic acid infusion yesterday which was the first time getting the infusion.  She reports some exertional dyspnea along with coughing along with myalgias and chest pain.  She is also complaining of diffuse joint pains. She had one episodes of diarrhea. Denies any fevers but states she has been having chills after the infusion. States lightheadedness was upon standing  Her apple watch alerted rapid HR and she came to the ER. INital EKG showed prolong Qtc. Mag was 1.1  Postural dizziness along with tachycardia likely secondary to dehydration.   --Patient getting IVF.  She had a syncopal episode previously that was worked up from cardiology standpoint and workup came back normal.   --improved vitals   Prolonged QTc  severe hypomagnesemia.   --came in with mag 1.1--IV mag --2.8 --repeat EKG --stable now --Seen by [facility] cards--ok to d/c home . Zio monitor next week   Paroxysmal Afib?  Patient with previous Zio monitor that was normal.   --Cardiology recommended a monitor device such as smart watch or Kardia.  --  Patient with A-fib detected by smart watch.  S --no further recs per cardiology --repeat EKG is NSR    HLD: continue home meds  GERD: continue home PPi  Osteoporosis: Patient status post Reclast.  Continue outpatient therapy. Discussed with pt chance of reaction decreases with subsequent infusions.   Overactive bladder: continue home regimen  Pt overall feels well. D/c plans d/w husband and pt and both agreeable       Consultants: [facility] cardiology Procedures performed: none  Disposition: Home Diet recommendation:  Cardiac diet DISCHARGE MEDICATION: Allergies as of 03/08/2024       Reactions   Oxycontin  [oxycodone  Hcl] Itching   Sleep Apnea   Penicillins Anaphylaxis, Itching   Chloral Hydrate Hives   Other Reaction: Not Assessed   Ciprofloxacin Itching   Colace [docusate Calcium ]    Docusate Sodium     Methotrexate And Trimetrexate Other (See Comments)   Questionable   Parafon Forte Dsc [chlorzoxazone] Itching, Other (See Comments)   Other Reaction: Not Assessed   Penicillin G Hives   Tape Other (See Comments)   adhesive  tape cause redness and skin to peel   Bextra [valdecoxib] Itching   Other reaction(s): Other (See Comments) Other Reaction: Not Assessed   Oxycodone  Itching   Rofecoxib Itching   Other reaction(s): Headache (finding)        Medication List     TAKE these medications    acetaminophen  650 MG CR tablet Commonly known as: TYLENOL  Take 650 mg by mouth every 8 (eight) hours as needed for pain.   aspirin  EC 81 MG  tablet Take 81 mg by mouth daily.   atorvastatin  10 MG tablet Commonly known as: LIPITOR Take 1 tablet (10 mg total) by mouth daily.   CALCIUM -VITAMIN D  PO Take by mouth daily.   cetirizine 10 MG tablet Commonly known as: ZYRTEC Take by mouth.   estradiol  0.1 MG/GM vaginal cream Commonly known as: ESTRACE  Estrogen Cream Instruction Discard applicator Apply pea sized amount to tip of finger to urethra before bed. Wash hands  well after application. Use Monday, Wednesday and Friday   furosemide  20 MG tablet Commonly known as: LASIX  Take 1 tablet (20 mg total) by mouth daily as needed.   magnesium  oxide 400 (240 Mg) MG tablet Commonly known as: MAG-OX Take 1 tablet (400 mg total) by mouth 2 (two) times daily for 7 days.   multivitamin tablet Take 1 tablet by mouth daily.   mupirocin  ointment 2 % Commonly known as: BACTROBAN  Apply 1 Application topically 2 (two) times daily.   Nasacort  Allergy 24HR 55 MCG/ACT Aero nasal inhaler Generic drug: triamcinolone  Place 1 spray into the nose daily.   omeprazole  20 MG capsule Commonly known as: PRILOSEC Take 1 capsule (20 mg total) by mouth 2 (two) times daily.   oxybutynin  10 MG 24 hr tablet Commonly known as: DITROPAN -XL Take 1 tablet (10 mg total) by mouth daily.   polyethylene glycol 17 g packet Commonly known as: MIRALAX / GLYCOLAX Take 17 g by mouth daily.        Follow-up Information     Maria Shad, MD. Schedule an appointment as soon as possible for a visit in 1 week(s).   Specialty: Internal Medicine Contact information: [address] [place] [address] [phone]         Maria Clap, MD. Schedule an appointment as soon as possible for a visit in 1 week(s).   Specialty: Cardiology Why: dizziness Contact information: [address] [place] [address] [phone]                Discharge Exam: Maria Keller   03/07/24 0744  Weight: 81.6 kg   Alert and awake Res CTA CV s1s2 normal Abd benign Neurology-- nonfocal  Condition at discharge: fair  The results of significant diagnostics from this hospitalization (including imaging, microbiology, ancillary and laboratory) are listed below for reference.   Imaging Studies: CT ABDOMEN PELVIS WO CONTRAST Result Date: 03/07/2024 EXAM: CT ABDOMEN AND PELVIS WITH CONTRAST 03/07/2024 12:15:05 PM TECHNIQUE: CT of the abdomen  and pelvis was performed with the administration of intravenous contrast. Multiplanar reformatted images are provided for review. Automated exposure control, iterative reconstruction, and/or weight-based adjustment of the mA/kV was utilized to reduce the radiation dose to as low as reasonably achievable. COMPARISON: None available. CLINICAL HISTORY: Abdominal pain, acute, nonlocalized. Acute, nonlocalized abdominal pain. FINDINGS: LOWER CHEST: Scar versus subsegmental atelectasis identified within the posterior lower lobes. No pleural effusion or consolidative change. LIVER: A cyst within the anterior right lobe of the liver measures 2 cm, image 27/2. GALLBLADDER AND BILE DUCTS: Status post cholecystectomy. No bile duct dilatation. SPLEEN: The spleen is within normal limits in size and appearance. PANCREAS: No acute abnormality. ADRENAL GLANDS: No acute abnormality. KIDNEYS, URETERS AND BLADDER: There is an exophytic cyst of the upper pole of the left kidney measuring 1.6 cm compatible with a benign Bosniak class I cyst. No follow-up imaging recommended unless otherwise clinically indicated or there is a history of malignancy. Contrast material is identified within the collecting systems of both  kidneys and the ureter There is also contrast filling the urinary bladder. No stones in the kidneys or ureters. No hydronephrosis. No perinephric or periureteral stranding. GI AND BOWEL: Tiny hiatal hernia. Sigmoid diverticula without signs of acute diverticulitis. Status post appendectomy. There is no bowel obstruction. PERITONEUM AND RETROPERITONEUM: No free fluid or fluid collections. No free air. VASCULATURE: Aorta is normal in caliber. LYMPH NODES: No lymphadenopathy. REPRODUCTIVE ORGANS: No acute abnormality. BONES AND SOFT TISSUES: Previous right hip arthroplasty. No acute osseous abnormality. No focal soft tissue abnormality. IMPRESSION: 1. No CT evidence of an acute abdominopelvic abnormality to explain the reported  pain. Electronically signed by: Waddell Calk MD 03/07/2024 12:42 PM EST RP Workstation: [id number]   CT Angio Chest PE W and/or Wo Contrast Result Date: 03/07/2024 CLINICAL DATA:  Right-sided chest pain that radiates to the back with lightheadedness. EXAM: CT ANGIOGRAPHY CHEST WITH CONTRAST TECHNIQUE: Multidetector CT imaging of the chest was performed using the standard protocol during bolus administration of intravenous contrast. Multiplanar CT image reconstructions and MIPs were obtained to evaluate the vascular anatomy. RADIATION DOSE REDUCTION: This exam was performed according to the departmental dose-optimization program which includes automated exposure control, adjustment of the mA and/or kV according to patient size and/or use of iterative reconstruction technique. CONTRAST:  75mL OMNIPAQUE  IOHEXOL  350 MG/ML SOLN COMPARISON:  None Available. FINDINGS: Cardiovascular: Satisfactory opacification of the pulmonary arteries to the segmental level. No evidence of pulmonary embolism. Normal heart size. No pericardial effusion. Mediastinum/Nodes: No enlarged mediastinal, hilar, or axillary lymph nodes. Thyroid  gland, trachea, and esophagus demonstrate no significant findings. Lungs/Pleura: There is mild bilateral lower lobe linear scarring and/or atelectasis. No pleural effusion or pneumothorax is identified. Upper Abdomen: There is a small hiatal hernia. Musculoskeletal: Surgical clips are seen within the upper outer quadrant of the right breast. No acute osseous abnormalities are identified. Review of the MIP images confirms the above findings. IMPRESSION: 1. No evidence of pulmonary embolism. 2. Mild bilateral lower lobe linear scarring and/or atelectasis. 3. Small hiatal hernia. Electronically Signed   By: Suzen Dials M.D.   On: 03/07/2024 10:58   DG Chest 2 View Result Date: 03/07/2024 EXAM: 2 VIEW(S) XRAY OF THE CHEST 03/07/2024 08:06:00 AM COMPARISON: 04/09/2022 CLINICAL HISTORY: Chest pain.  FINDINGS: LUNGS AND PLEURA: No focal pulmonary opacity. No pleural effusion. No pneumothorax. HEART AND MEDIASTINUM: No acute abnormality of the cardiac and mediastinal silhouettes. BONES AND SOFT TISSUES: Surgical clips in right breast. Degenerative changes of spine. IMPRESSION: 1. No acute findings. Electronically signed by: Waddell Calk MD 03/07/2024 08:48 AM EST RP Workstation: [id number]    Microbiology: Results for orders placed or performed during the hospital encounter of 03/07/24  Resp panel by RT-PCR (RSV, Flu A&B, Covid) Anterior Nasal Swab     Status: None   Collection Time: 03/07/24  7:50 AM   Specimen: Anterior Nasal Swab  Result Value Ref Range Status   SARS Coronavirus 2 by RT PCR NEGATIVE NEGATIVE Final    Comment: (NOTE) SARS-CoV-2 target nucleic acids are NOT DETECTED.  The SARS-CoV-2 RNA is generally detectable in upper respiratory specimens during the acute phase of infection. The lowest concentration of SARS-CoV-2 viral copies this assay can detect is 138 copies/mL. A negative result does not preclude SARS-Cov-2 infection and should not be used as the sole basis for treatment or other patient management decisions. A negative result may occur with  improper specimen collection/handling, submission of specimen other than nasopharyngeal swab, presence of viral mutation(s) within the areas targeted by  this assay, and inadequate number of viral copies(<138 copies/mL). A negative result must be combined with clinical observations, patient history, and epidemiological information. The expected result is Negative.  Fact Sheet for Patients:  bloggercourse.com  Fact Sheet for Healthcare Providers:  seriousbroker.it  This test is no t yet approved or cleared by the [place]  FDA and  has been authorized for detection and/or diagnosis of SARS-CoV-2 by FDA under an Emergency Use Authorization (EUA). This EUA will remain   in effect (meaning this test can be used) for the duration of the COVID-19 declaration under Section 564(b)(1) of the Act, 21 U.S.C.section 360bbb-3(b)(1), unless the authorization is terminated  or revoked sooner.       Influenza A by PCR NEGATIVE NEGATIVE Final   Influenza B by PCR NEGATIVE NEGATIVE Final    Comment: (NOTE) The Xpert Xpress SARS-CoV-2/FLU/RSV plus assay is intended as an aid in the diagnosis of influenza from Nasopharyngeal swab specimens and should not be used as a sole basis for treatment. Nasal washings and aspirates are unacceptable for Xpert Xpress SARS-CoV-2/FLU/RSV testing.  Fact Sheet for Patients: bloggercourse.com  Fact Sheet for Healthcare Providers: seriousbroker.it  This test is not yet approved or cleared by the [place]  FDA and has been authorized for detection and/or diagnosis of SARS-CoV-2 by FDA under an Emergency Use Authorization (EUA). This EUA will remain in effect (meaning this test can be used) for the duration of the COVID-19 declaration under Section 564(b)(1) of the Act, 21 U.S.C. section 360bbb-3(b)(1), unless the authorization is terminated or revoked.     Resp Syncytial Virus by PCR NEGATIVE NEGATIVE Final    Comment: (NOTE) Fact Sheet for Patients: bloggercourse.com  Fact Sheet for Healthcare Providers: seriousbroker.it  This test is not yet approved or cleared by the [place]  FDA and has been authorized for detection and/or diagnosis of SARS-CoV-2 by FDA under an Emergency Use Authorization (EUA). This EUA will remain in effect (meaning this test can be used) for the duration of the COVID-19 declaration under Section 564(b)(1) of the Act, 21 U.S.C. section 360bbb-3(b)(1), unless the authorization is terminated or revoked.  Performed at [facility], [address] [place], [place] [address]    Respiratory (~20 pathogens) panel by PCR     Status: None   Collection Time: 03/07/24  3:31 PM   Specimen: Nasopharyngeal Swab; Respiratory  Result Value Ref Range Status   Adenovirus NOT DETECTED NOT DETECTED Final   Coronavirus 229E NOT DETECTED NOT DETECTED Final    Comment: (NOTE) The Coronavirus on the Respiratory Panel, DOES NOT test for the novel  Coronavirus (2019 nCoV)    Coronavirus HKU1 NOT DETECTED NOT DETECTED Final   Coronavirus NL63 NOT DETECTED NOT DETECTED Final   Coronavirus OC43 NOT DETECTED NOT DETECTED Final   Metapneumovirus NOT DETECTED NOT DETECTED Final   Rhinovirus / Enterovirus NOT DETECTED NOT DETECTED Final   Influenza A NOT DETECTED NOT DETECTED Final   Influenza B NOT DETECTED NOT DETECTED Final   Parainfluenza Virus 1 NOT DETECTED NOT DETECTED Final   Parainfluenza Virus 2 NOT DETECTED NOT DETECTED Final   Parainfluenza Virus 3 NOT DETECTED NOT DETECTED Final   Parainfluenza Virus 4 NOT DETECTED NOT DETECTED Final   Respiratory Syncytial Virus NOT DETECTED NOT DETECTED Final   Bordetella pertussis NOT DETECTED NOT DETECTED Final   Bordetella Parapertussis NOT DETECTED NOT DETECTED Final   Chlamydophila pneumoniae NOT DETECTED NOT DETECTED Final   Mycoplasma pneumoniae NOT DETECTED NOT DETECTED Final  Comment: Performed at [facility], [address] [place], [place] [address]    Labs: CBC: Recent Labs  Lab 03/07/24 0746 03/08/24 0506  WBC 15.6* 7.6  HGB 13.1 11.1*  HCT 41.5 35.1*  MCV 85.9 86.0  PLT 224 170   Basic Metabolic Panel: Recent Labs  Lab 03/07/24 0746 03/07/24 0956 03/08/24 0506 03/08/24 1048  NA 140  --  136  --   K 3.5  --  4.5  --   CL 100  --  103  --   CO2 25  --  24  --   GLUCOSE 184*  --  115*  --   BUN 11  --  11  --   CREATININE 0.93  --  0.83  --   CALCIUM  9.4  --  8.2*  --   MG  --  1.1*  --  2.8*   Liver Function Tests: Recent Labs  Lab 03/07/24 0746  AST 37  ALT 26  ALKPHOS 83  BILITOT  1.0  PROT 7.6  ALBUMIN 4.4   CBG: Recent Labs  Lab 03/08/24 0827  GLUCAP 104*    Discharge time spent: greater than 30 minutes.  Signed: Leita Blanch, MD [facility] 03/08/2024 "

## 2024-03-08 NOTE — Consult Note (Addendum)
 " Cardiology Consultation:  Patient ID: Maria Keller MRN: [id number]; DOB: [birth year]  Admit date: 03/07/2024 Date of Consult: 03/08/2024  Primary Care Provider: Glendia Shad, MD Primary Cardiologist: Evalene Lunger, MD  Primary Electrophysiologist:  None   Patient Profile:  Maria Keller is a 74 y.o. female with a hx of HLD, non-Hodgkin's lymphoma who is being seen today for the evaluation of possible atrial fibrillation seen on an Apple watch.  History of Present Illness:  Maria Keller presents with postural dizziness, tachycardia, dehydration.  This reportedly occurred after an infusion of zoledronic acid couple of days ago.  Her Apple watch picked up some tracings that were labeled as atrial fibrillation.  She notes that she had significant dizziness and palpitations following the infusion.  On presentation, hypotensive and tachycardic.  BNP negative, troponin negative.  Leukocytosis to 15.6, CBC otherwise unremarkable.  BMP unremarkable.  Magnesium  very low at 1.1.  RVP negative.  CXR unremarkable.  CTPA without PE, no CAC or aortic atherosclerosis.  Past Medical History: Past Medical History:  Diagnosis Date   Allergy    Anemia    iron deficiency and B12 deficiency   Arthritis    Blood transfusion without reported diagnosis    Bronchitis 08/2015   Chronic back pain    followed by Dr Dannial   COPD (chronic obstructive pulmonary disease) (HCC)    Depression    Diverticulosis    Esophageal stricture    requiring dilatation x 2   Fibromyalgia    Fibromyalgia    Fibromyalgia    Follicular lymphoma (HCC)    Followed by Dr DeCastro, s/p chemo and XRT   Follicular lymphoma (HCC)    GERD (gastroesophageal reflux disease)    GERD (gastroesophageal reflux disease)    Hemorrhoid    Hiatal hernia    History of kidney stones    Hypercholesterolemia    IBS (irritable bowel syndrome)    IBS (irritable bowel syndrome)    Nephrolithiasis    followed by Dr Twylla, s/p stents   PONV  (postoperative nausea and vomiting)    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   Shingles outbreak 11/26/2011   Sleep apnea     Past Surgical History: Past Surgical History:  Procedure Laterality Date   APPENDECTOMY     BIOPSY  11/05/2022   Procedure: BIOPSY;  Surgeon: Maryruth Ole DASEN, MD;  Location: [facility] ENDOSCOPY;  Service: Endoscopy;;   BLADDER SURGERY  1993   BREAST BIOPSY N/A    capsule endoscopy     CHOLECYSTECTOMY  1995   COLONOSCOPY WITH PROPOFOL  N/A 06/19/2017   Procedure: COLONOSCOPY WITH PROPOFOL ;  Surgeon: Viktoria Lamar DASEN, MD;  Location: [facility] ENDOSCOPY;  Service: Endoscopy;  Laterality: N/A;   COLONOSCOPY WITH PROPOFOL  N/A 11/05/2022   Procedure: COLONOSCOPY WITH PROPOFOL ;  Surgeon: Maryruth Ole DASEN, MD;  Location: [facility] ENDOSCOPY;  Service: Endoscopy;  Laterality: N/A;   ESOPHAGOGASTRODUODENOSCOPY (EGD) WITH PROPOFOL  N/A 06/19/2017   Procedure: ESOPHAGOGASTRODUODENOSCOPY (EGD) WITH PROPOFOL ;  Surgeon: Viktoria Lamar DASEN, MD;  Location: [facility] ENDOSCOPY;  Service: Endoscopy;  Laterality: N/A;   ESOPHAGOGASTRODUODENOSCOPY (EGD) WITH PROPOFOL  N/A 11/05/2022   Procedure: ESOPHAGOGASTRODUODENOSCOPY (EGD) WITH PROPOFOL ;  Surgeon: Maryruth Ole DASEN, MD;  Location: [facility] ENDOSCOPY;  Service: Endoscopy;  Laterality: N/A;   EYE SURGERY     both cataract remove   FRACTURE SURGERY Left    ankle, plate with 8 screws   history of helicobacter pylori infection  JOINT REPLACEMENT Right    knee   LEFT OOPHORECTOMY     LEG SURGERY Left    ORIF ANKLE FRACTURE  2012   OVARIAN CYST REMOVAL     right   POLYPECTOMY  11/05/2022   Procedure: POLYPECTOMY;  Surgeon: Maryruth Ole DASEN, MD;  Location: [facility] ENDOSCOPY;  Service: Endoscopy;;   RECTOCELE REPAIR N/A    REPLACEMENT TOTAL KNEE Right 03/19/2016   TONSILECTOMY/ADENOIDECTOMY WITH MYRINGOTOMY  1970   TONSILLECTOMY N/A    TOTAL HIP ARTHROPLASTY Right 10/2023   TOTAL KNEE REVISION  Right 11/07/2021   Procedure: TOTAL KNEE REVISION;  Surgeon: Edie Norleen PARAS, MD;  Location: [facility] ORS;  Service: Orthopedics;  Laterality: Right;   TUBAL LIGATION  1976   VAGINAL HYSTERECTOMY  1980's   secondary to bleeding     Allergies:    Allergies[1]  Social History:   Social History   Socioeconomic History   Marital status: Married    Spouse name: [name]   Number of children: Not on file   Years of education: Not on file   Highest education level: Associate degree: academic program  Occupational History   Not on file  Tobacco Use   Smoking status: Never   Smokeless tobacco: Never  Vaping Use   Vaping status: Never Used  Substance and Sexual Activity   Alcohol use: No    Alcohol/week: 0.0 standard drinks of alcohol   Drug use: No   Sexual activity: Yes  Other Topics Concern   Not on file  Social History Narrative   Not on file   Social Drivers of Health   Tobacco Use: Low Risk (03/07/2024)   Patient History    Smoking Tobacco Use: Never    Smokeless Tobacco Use: Never    Passive Exposure: Not on file  Financial Resource Strain: Low Risk (01/09/2024)   Overall Financial Resource Strain (CARDIA)    Difficulty of Paying Living Expenses: Not hard at all  Food Insecurity: No Food Insecurity (01/13/2024)   Epic    Worried About Radiation Protection Practitioner of Food in the Last Year: Never true    Ran Out of Food in the Last Year: Never true  Transportation Needs: No Transportation Needs (01/13/2024)   Epic    Lack of Transportation (Medical): No    Lack of Transportation (Non-Medical): No  Physical Activity: Inactive (01/09/2024)   Exercise Vital Sign    Days of Exercise per Week: 0 days    Minutes of Exercise per Session: 0 min  Stress: No Stress Concern Present (01/09/2024)   [employer] of Occupational Health - Occupational Stress Questionnaire    Feeling of Stress: Not at all  Social Connections: Socially Integrated (01/09/2024)   Social Connection and Isolation Panel     Frequency of Communication with Friends and Family: More than three times a week    Frequency of Social Gatherings with Friends and Family: More than three times a week    Attends Religious Services: More than 4 times per year    Active Member of Clubs or Organizations: Yes    Attends Banker Meetings: More than 4 times per year    Marital Status: Married  Catering Manager Violence: Not At Risk (01/13/2024)   Epic    Fear of Current or Ex-Partner: No    Emotionally Abused: No    Physically Abused: No    Sexually Abused: No  Depression (PHQ2-9): Low Risk (01/13/2024)   Depression (PHQ2-9)    PHQ-2 Score: 0  Alcohol Screen: Low Risk (01/13/2024)   Alcohol Screen    Last Alcohol Screening Score (AUDIT): 0  Housing: Low Risk (01/13/2024)   Epic    Unable to Pay for Housing in the Last Year: No    Number of Times Moved in the Last Year: 0    Homeless in the Last Year: No  Utilities: Not At Risk (01/13/2024)   Epic    Threatened with loss of utilities: No  Health Literacy: Adequate Health Literacy (01/13/2024)   B1300 Health Literacy    Frequency of need for help with medical instructions: Never     Family History:    Family History  Problem Relation Age of Onset   Ulcers Mother    Ulcers Father    Breast cancer Other        great aunt and grandfather   Lung cancer Other        uncle   Hypertension Maternal Grandmother    Heart disease Maternal Grandmother        MI 33s   Diabetes Maternal Grandmother    Arthritis Maternal Grandmother    Diabetes Maternal Grandfather    Cancer Brother    Cancer Maternal Aunt    Miscarriages / Stillbirths Sister    COPD Brother    ADD / ADHD Brother      ROS:  All other ROS reviewed and negative. Pertinent positives noted in the HPI.     Physical Exam/Data:   Vitals:   03/08/24 0056 03/08/24 0056 03/08/24 0534 03/08/24 0915  BP: (!) 102/46  (!) 109/45 (!) 110/46  Pulse: 83  99 89  Resp: 18  18 17   Temp:  98.7 F (37.1  C) 100.2 F (37.9 C) 99.5 F (37.5 C)  TempSrc:  Oral Oral Oral  SpO2: 97%  100% 94%  Weight:      Height:        Intake/Output Summary (Last 24 hours) at 03/08/2024 0925 Last data filed at 03/07/2024 2109 Gross per 24 hour  Intake 103 ml  Output --  Net 103 ml       03/07/2024    7:44 AM 02/07/2024    3:34 PM 01/21/2024    7:15 AM  Last 3 Weights  Weight (lbs) 180 lb 180 lb 4 oz 179 lb  Weight (kg) 81.647 kg 81.761 kg 81.194 kg    Body mass index is 28.19 kg/m.  General: no acute distress  Neck: no JVD Cardiac: Normal S1, S2; RRR; no murmurs, rubs, or gallops Lungs: Clear to auscultation bilaterally, no wheezing, rhonchi or rales  Ext: No edema, pulses 2+ Skin: Warm and dry Neuro: Alert and oriented to person, place, time, and situation Psych: Normal mood and affect   EKG:  The EKG was personally reviewed and demonstrates: Sinus tachycardia, inferior Q waves, prolonged QT Telemetry:  Telemetry was personally reviewed and demonstrates: Sinus rhythm, sinus tachycardia  Relevant CV Studies: - CTPA 03/07/2024 no evidence of CAC or aortic atherosclerosis - Monitor 11/2023 mean heart rate 82 bpm, rare ectopy.  1 5 beat episode of NSVT.  27 episodes of SVT (longest 10.8 seconds, max HR 10.8 seconds) - TTE 10/2023 normal biventricular function without significant valvular disease  Assessment and Plan:  Questionable atrial fibrillation seen on an Apple watch SVT NSVT Hypomagnesemia Acquired Prolonged QT interval Presyncope Paroxysmal tachycardia Palpitations Patient presents with orthostasis, presyncope, and palpitations following an infusion of zoledronic acid.  Her Apple Watch labeled some tracings as atrial fibrillation.  Her  ECGs and telemetry while here all show sinus rhythm and sinus tachycardia.  Recent echocardiogram was normal, so no clear structural substrate for arrhythmia.  She had a monitor back from October which showed relatively frequent SVT and PACs.  Magnesium  is  very low at 1.1 and QT is newly prolonged to 525; it was previously in the normal range.  Suspect her symptoms and Apple watch tracing are due to brief paroxysms of SVT which were provoked by reaction to zoledronic acid as well as hypomagnesemia.  We cannot use the Apple watch tracings to formally diagnose atrial fibrillation.  Plan: - Replete magnesium  to >2; recheck ECG at that time to ensure resolution of acquired QT prolongation - If the SVT symptoms are per particularly bothersome, can trial a low-dose beta-blocker to help suppress - We will order an outpatient monitor to reassess any possibility of atrial fibrillation; as above, cannot diagnose based on Apple watch tracings.  I discussed with husband, he says he will pick it up from our office later this week   Cardiology will sign off now.  Please feel free to reengage as needed.  For questions or updates, please contact [facility] Please consult [email or web address] for contact info under    Signed, Caron Poser, MD  [place]  [facility]  03/08/2024 9:25 AM     [1]  Allergies Allergen Reactions   Oxycontin  [Oxycodone  Hcl] Itching    Sleep Apnea   Penicillins Anaphylaxis and Itching   Chloral Hydrate Hives    Other Reaction: Not Assessed   Ciprofloxacin Itching   Colace [Docusate Calcium ]    Docusate Sodium     Methotrexate And Trimetrexate Other (See Comments)    Questionable    Parafon Forte Dsc [Chlorzoxazone] Itching and Other (See Comments)    Other Reaction: Not Assessed   Penicillin G Hives   Tape Other (See Comments)    adhesive  tape cause redness and skin to peel   Bextra [Valdecoxib] Itching    Other reaction(s): Other (See Comments) Other Reaction: Not Assessed   Oxycodone  Itching   Rofecoxib Itching    Other reaction(s): Headache (finding)   "

## 2024-03-08 NOTE — Discharge Instructions (Signed)
 Pt to pick up Zio monitor from [facility] office next week

## 2024-03-08 NOTE — ED Notes (Signed)
 Pt verbalizes understanding of discharge instructions. Opportunity for questioning and answers were provided. Pt discharged from ED with husband.

## 2024-03-09 ENCOUNTER — Ambulatory Visit

## 2024-03-09 ENCOUNTER — Telehealth: Payer: Self-pay | Admitting: *Deleted

## 2024-03-09 ENCOUNTER — Other Ambulatory Visit: Payer: Self-pay | Admitting: *Deleted

## 2024-03-09 ENCOUNTER — Ambulatory Visit: Admitting: Urology

## 2024-03-09 ENCOUNTER — Telehealth: Payer: Self-pay

## 2024-03-09 DIAGNOSIS — R55 Syncope and collapse: Secondary | ICD-10-CM

## 2024-03-09 DIAGNOSIS — R42 Dizziness and giddiness: Secondary | ICD-10-CM

## 2024-03-09 NOTE — Telephone Encounter (Signed)
 See me about this.  Please hold for work in appt - for hospital follow up.

## 2024-03-09 NOTE — Telephone Encounter (Signed)
 Called and spoke with patient; identity verified using 2 identifiers:  pt is agreeable to wearing a heart monitor;  will order and have shipped to pt's home; reassured pt that if she had any questions or felt she needed assistance placing monitor, she can call our office and ask for a nurse visit; also make pt a follow up visit with Dr. Gollan 04/07/2024 post hospitalization and post heart monitor.  Pt thanked me for the follow up phone call; all questions answered; pt verbalized understanding.

## 2024-03-12 NOTE — Telephone Encounter (Signed)
 Please let her know that I can see her Monday if ok to do hospital follow Monday with me. It appears she is currently scheduled with Rollene tomorrow. She can keep tomorrow's appt if prefers, but just wanted to offer Monday appt if desires.

## 2024-03-13 ENCOUNTER — Inpatient Hospital Stay: Admitting: Nurse Practitioner

## 2024-03-16 ENCOUNTER — Inpatient Hospital Stay: Admitting: Internal Medicine

## 2024-04-07 ENCOUNTER — Ambulatory Visit: Admitting: Cardiovascular Disease

## 2024-04-22 ENCOUNTER — Ambulatory Visit: Admitting: Internal Medicine

## 2024-05-18 ENCOUNTER — Ambulatory Visit: Admitting: Urology

## 2025-01-18 ENCOUNTER — Ambulatory Visit
